# Patient Record
Sex: Male | Born: 1960 | Race: Black or African American | Hispanic: No | Marital: Single | Smoking: Former smoker
Health system: Southern US, Community
[De-identification: ages and names within clinical notes are randomized; demographics above are authoritative.]

## PROBLEM LIST (undated history)

## (undated) DIAGNOSIS — M1990 Unspecified osteoarthritis, unspecified site: Secondary | ICD-10-CM

## (undated) DIAGNOSIS — K4091 Unilateral inguinal hernia, without obstruction or gangrene, recurrent: Secondary | ICD-10-CM

## (undated) DIAGNOSIS — I5189 Other ill-defined heart diseases: Secondary | ICD-10-CM

## (undated) DIAGNOSIS — E559 Vitamin D deficiency, unspecified: Secondary | ICD-10-CM

## (undated) DIAGNOSIS — I2510 Atherosclerotic heart disease of native coronary artery without angina pectoris: Secondary | ICD-10-CM

## (undated) DIAGNOSIS — I10 Essential (primary) hypertension: Secondary | ICD-10-CM

## (undated) DIAGNOSIS — K429 Umbilical hernia without obstruction or gangrene: Secondary | ICD-10-CM

## (undated) DIAGNOSIS — G713 Mitochondrial myopathy, not elsewhere classified: Secondary | ICD-10-CM

## (undated) DIAGNOSIS — M5030 Other cervical disc degeneration, unspecified cervical region: Secondary | ICD-10-CM

## (undated) DIAGNOSIS — T7840XA Allergy, unspecified, initial encounter: Secondary | ICD-10-CM

## (undated) DIAGNOSIS — K625 Hemorrhage of anus and rectum: Secondary | ICD-10-CM

## (undated) DIAGNOSIS — K5909 Other constipation: Secondary | ICD-10-CM

## (undated) DIAGNOSIS — Z87891 Personal history of nicotine dependence: Secondary | ICD-10-CM

## (undated) DIAGNOSIS — E785 Hyperlipidemia, unspecified: Secondary | ICD-10-CM

## (undated) DIAGNOSIS — M5410 Radiculopathy, site unspecified: Secondary | ICD-10-CM

## (undated) DIAGNOSIS — G4730 Sleep apnea, unspecified: Secondary | ICD-10-CM

## (undated) DIAGNOSIS — G4733 Obstructive sleep apnea (adult) (pediatric): Secondary | ICD-10-CM

## (undated) DIAGNOSIS — R569 Unspecified convulsions: Secondary | ICD-10-CM

## (undated) DIAGNOSIS — E119 Type 2 diabetes mellitus without complications: Secondary | ICD-10-CM

## (undated) DIAGNOSIS — G479 Sleep disorder, unspecified: Secondary | ICD-10-CM

## (undated) DIAGNOSIS — G629 Polyneuropathy, unspecified: Secondary | ICD-10-CM

## (undated) DIAGNOSIS — N529 Male erectile dysfunction, unspecified: Secondary | ICD-10-CM

## (undated) DIAGNOSIS — I209 Angina pectoris, unspecified: Secondary | ICD-10-CM

## (undated) DIAGNOSIS — R0600 Dyspnea, unspecified: Secondary | ICD-10-CM

## (undated) DIAGNOSIS — D751 Secondary polycythemia: Secondary | ICD-10-CM

## (undated) DIAGNOSIS — K219 Gastro-esophageal reflux disease without esophagitis: Secondary | ICD-10-CM

## (undated) DIAGNOSIS — R0602 Shortness of breath: Secondary | ICD-10-CM

## (undated) DIAGNOSIS — E669 Obesity, unspecified: Secondary | ICD-10-CM

## (undated) DIAGNOSIS — K469 Unspecified abdominal hernia without obstruction or gangrene: Secondary | ICD-10-CM

## (undated) HISTORY — PX: WRIST SURGERY: SHX841

## (undated) HISTORY — PX: SPINE SURGERY: SHX786

## (undated) HISTORY — DX: Obesity, unspecified: E66.9

## (undated) HISTORY — DX: Shortness of breath: R06.02

## (undated) HISTORY — DX: Unilateral inguinal hernia, without obstruction or gangrene, recurrent: K40.91

## (undated) HISTORY — DX: Other constipation: K59.09

## (undated) HISTORY — DX: Allergy, unspecified, initial encounter: T78.40XA

## (undated) HISTORY — DX: Vitamin D deficiency, unspecified: E55.9

## (undated) HISTORY — DX: Unspecified osteoarthritis, unspecified site: M19.90

## (undated) HISTORY — DX: Gastro-esophageal reflux disease without esophagitis: K21.9

## (undated) HISTORY — DX: Unspecified abdominal hernia without obstruction or gangrene: K46.9

## (undated) HISTORY — DX: Other ill-defined heart diseases: I51.89

## (undated) HISTORY — DX: Angina pectoris, unspecified: I20.9

## (undated) HISTORY — DX: Polyneuropathy, unspecified: G62.9

## (undated) HISTORY — DX: Hyperlipidemia, unspecified: E78.5

## (undated) HISTORY — DX: Other cervical disc degeneration, unspecified cervical region: M50.30

## (undated) HISTORY — DX: Secondary polycythemia: D75.1

## (undated) HISTORY — DX: Radiculopathy, site unspecified: M54.10

## (undated) HISTORY — DX: Type 2 diabetes mellitus without complications: E11.9

## (undated) HISTORY — DX: Personal history of nicotine dependence: Z87.891

## (undated) HISTORY — DX: Umbilical hernia without obstruction or gangrene: K42.9

## (undated) HISTORY — DX: Hemorrhage of anus and rectum: K62.5

## (undated) HISTORY — DX: Essential (primary) hypertension: I10

---

## 1989-12-28 DIAGNOSIS — K469 Unspecified abdominal hernia without obstruction or gangrene: Secondary | ICD-10-CM

## 1989-12-28 HISTORY — DX: Unspecified abdominal hernia without obstruction or gangrene: K46.9

## 1989-12-28 HISTORY — PX: HERNIA REPAIR: SHX51

## 2005-04-03 ENCOUNTER — Ambulatory Visit: Payer: Self-pay | Admitting: Unknown Physician Specialty

## 2006-01-12 ENCOUNTER — Other Ambulatory Visit: Payer: Self-pay

## 2006-01-14 ENCOUNTER — Inpatient Hospital Stay: Payer: Self-pay | Admitting: Unknown Physician Specialty

## 2006-06-04 ENCOUNTER — Ambulatory Visit: Payer: Self-pay | Admitting: Family Medicine

## 2006-12-28 DIAGNOSIS — I10 Essential (primary) hypertension: Secondary | ICD-10-CM

## 2006-12-28 HISTORY — DX: Essential (primary) hypertension: I10

## 2007-03-29 ENCOUNTER — Ambulatory Visit: Payer: Self-pay | Admitting: Internal Medicine

## 2007-04-12 ENCOUNTER — Ambulatory Visit: Payer: Self-pay | Admitting: Internal Medicine

## 2007-04-21 ENCOUNTER — Ambulatory Visit: Payer: Self-pay | Admitting: General Practice

## 2007-04-28 ENCOUNTER — Ambulatory Visit: Payer: Self-pay | Admitting: Internal Medicine

## 2007-05-29 ENCOUNTER — Ambulatory Visit: Payer: Self-pay | Admitting: Internal Medicine

## 2007-06-28 ENCOUNTER — Ambulatory Visit: Payer: Self-pay | Admitting: Internal Medicine

## 2007-07-29 ENCOUNTER — Ambulatory Visit: Payer: Self-pay | Admitting: Internal Medicine

## 2007-08-29 ENCOUNTER — Ambulatory Visit: Payer: Self-pay | Admitting: Internal Medicine

## 2007-09-19 ENCOUNTER — Ambulatory Visit: Payer: Self-pay | Admitting: Internal Medicine

## 2007-09-28 ENCOUNTER — Ambulatory Visit: Payer: Self-pay | Admitting: Internal Medicine

## 2007-10-29 ENCOUNTER — Ambulatory Visit: Payer: Self-pay | Admitting: Internal Medicine

## 2007-11-02 ENCOUNTER — Ambulatory Visit: Payer: Self-pay | Admitting: Internal Medicine

## 2007-11-28 ENCOUNTER — Ambulatory Visit: Payer: Self-pay | Admitting: Internal Medicine

## 2007-12-29 ENCOUNTER — Ambulatory Visit: Payer: Self-pay | Admitting: Internal Medicine

## 2008-01-29 ENCOUNTER — Ambulatory Visit: Payer: Self-pay | Admitting: Internal Medicine

## 2008-03-06 ENCOUNTER — Ambulatory Visit: Payer: Self-pay | Admitting: Internal Medicine

## 2008-03-07 ENCOUNTER — Ambulatory Visit: Payer: Self-pay | Admitting: Internal Medicine

## 2008-03-28 ENCOUNTER — Ambulatory Visit: Payer: Self-pay | Admitting: Internal Medicine

## 2008-04-07 ENCOUNTER — Emergency Department: Payer: Self-pay | Admitting: Emergency Medicine

## 2008-04-27 ENCOUNTER — Ambulatory Visit: Payer: Self-pay | Admitting: Internal Medicine

## 2008-05-28 ENCOUNTER — Ambulatory Visit: Payer: Self-pay | Admitting: Internal Medicine

## 2008-06-27 ENCOUNTER — Ambulatory Visit: Payer: Self-pay | Admitting: Internal Medicine

## 2008-07-28 ENCOUNTER — Ambulatory Visit: Payer: Self-pay | Admitting: Internal Medicine

## 2008-08-28 ENCOUNTER — Ambulatory Visit: Payer: Self-pay | Admitting: Internal Medicine

## 2008-09-27 ENCOUNTER — Ambulatory Visit: Payer: Self-pay | Admitting: Internal Medicine

## 2008-10-28 ENCOUNTER — Ambulatory Visit: Payer: Self-pay | Admitting: Internal Medicine

## 2008-11-27 ENCOUNTER — Ambulatory Visit: Payer: Self-pay | Admitting: Internal Medicine

## 2008-12-28 ENCOUNTER — Ambulatory Visit: Payer: Self-pay | Admitting: Internal Medicine

## 2008-12-28 HISTORY — PX: BACK SURGERY: SHX140

## 2009-01-28 ENCOUNTER — Ambulatory Visit: Payer: Self-pay | Admitting: Internal Medicine

## 2009-02-25 ENCOUNTER — Ambulatory Visit: Payer: Self-pay | Admitting: Internal Medicine

## 2009-03-07 ENCOUNTER — Ambulatory Visit: Payer: Self-pay | Admitting: Internal Medicine

## 2009-03-28 ENCOUNTER — Ambulatory Visit: Payer: Self-pay | Admitting: Internal Medicine

## 2009-04-01 ENCOUNTER — Inpatient Hospital Stay: Payer: Self-pay | Admitting: Internal Medicine

## 2009-04-27 ENCOUNTER — Ambulatory Visit: Payer: Self-pay | Admitting: Internal Medicine

## 2009-05-28 ENCOUNTER — Ambulatory Visit: Payer: Self-pay | Admitting: Internal Medicine

## 2009-05-30 ENCOUNTER — Ambulatory Visit: Payer: Self-pay | Admitting: Internal Medicine

## 2009-06-27 ENCOUNTER — Ambulatory Visit: Payer: Self-pay | Admitting: Internal Medicine

## 2009-07-28 ENCOUNTER — Ambulatory Visit: Payer: Self-pay | Admitting: Internal Medicine

## 2009-08-22 ENCOUNTER — Ambulatory Visit: Payer: Self-pay | Admitting: Internal Medicine

## 2009-08-28 ENCOUNTER — Ambulatory Visit: Payer: Self-pay | Admitting: Internal Medicine

## 2009-10-28 ENCOUNTER — Ambulatory Visit: Payer: Self-pay | Admitting: Internal Medicine

## 2009-11-14 ENCOUNTER — Ambulatory Visit: Payer: Self-pay | Admitting: Internal Medicine

## 2009-11-27 ENCOUNTER — Ambulatory Visit: Payer: Self-pay | Admitting: Internal Medicine

## 2009-12-13 ENCOUNTER — Ambulatory Visit: Payer: Self-pay

## 2009-12-29 ENCOUNTER — Ambulatory Visit: Payer: Self-pay

## 2010-01-08 ENCOUNTER — Ambulatory Visit: Payer: Self-pay | Admitting: Pain Medicine

## 2010-01-14 ENCOUNTER — Ambulatory Visit: Payer: Self-pay | Admitting: Pain Medicine

## 2010-02-12 ENCOUNTER — Ambulatory Visit: Payer: Self-pay | Admitting: Pain Medicine

## 2010-02-18 ENCOUNTER — Ambulatory Visit: Payer: Self-pay | Admitting: Pain Medicine

## 2010-03-04 ENCOUNTER — Ambulatory Visit: Payer: Self-pay | Admitting: Pain Medicine

## 2010-03-27 ENCOUNTER — Ambulatory Visit: Payer: Self-pay | Admitting: Pain Medicine

## 2010-04-27 ENCOUNTER — Ambulatory Visit: Payer: Self-pay | Admitting: Internal Medicine

## 2010-05-15 ENCOUNTER — Ambulatory Visit: Payer: Self-pay | Admitting: Internal Medicine

## 2010-05-28 ENCOUNTER — Ambulatory Visit: Payer: Self-pay | Admitting: Internal Medicine

## 2010-06-27 ENCOUNTER — Ambulatory Visit: Payer: Self-pay | Admitting: Internal Medicine

## 2010-07-28 ENCOUNTER — Ambulatory Visit: Payer: Self-pay | Admitting: Internal Medicine

## 2010-08-27 ENCOUNTER — Ambulatory Visit: Payer: Self-pay | Admitting: Internal Medicine

## 2010-08-28 ENCOUNTER — Ambulatory Visit: Payer: Self-pay | Admitting: Internal Medicine

## 2010-11-26 ENCOUNTER — Ambulatory Visit: Payer: Self-pay | Admitting: Internal Medicine

## 2010-11-27 ENCOUNTER — Ambulatory Visit: Payer: Self-pay | Admitting: Internal Medicine

## 2010-12-28 DIAGNOSIS — K429 Umbilical hernia without obstruction or gangrene: Secondary | ICD-10-CM

## 2010-12-28 DIAGNOSIS — K4091 Unilateral inguinal hernia, without obstruction or gangrene, recurrent: Secondary | ICD-10-CM

## 2010-12-28 DIAGNOSIS — Z87891 Personal history of nicotine dependence: Secondary | ICD-10-CM

## 2010-12-28 DIAGNOSIS — E669 Obesity, unspecified: Secondary | ICD-10-CM

## 2010-12-28 HISTORY — DX: Umbilical hernia without obstruction or gangrene: K42.9

## 2010-12-28 HISTORY — DX: Unilateral inguinal hernia, without obstruction or gangrene, recurrent: K40.91

## 2010-12-28 HISTORY — DX: Obesity, unspecified: E66.9

## 2010-12-28 HISTORY — DX: Personal history of nicotine dependence: Z87.891

## 2010-12-28 HISTORY — PX: INGUINAL HERNIA REPAIR: SUR1180

## 2011-01-30 ENCOUNTER — Ambulatory Visit: Payer: Self-pay | Admitting: Anesthesiology

## 2011-02-09 ENCOUNTER — Ambulatory Visit: Payer: Self-pay | Admitting: General Surgery

## 2011-02-18 ENCOUNTER — Ambulatory Visit: Payer: Self-pay | Admitting: Internal Medicine

## 2011-02-26 ENCOUNTER — Ambulatory Visit: Payer: Self-pay | Admitting: Internal Medicine

## 2011-05-05 ENCOUNTER — Ambulatory Visit: Payer: Self-pay | Admitting: Family Medicine

## 2012-12-28 HISTORY — PX: INGUINAL HERNIA REPAIR: SUR1180

## 2012-12-28 HISTORY — PX: COLONOSCOPY: SHX174

## 2013-03-20 ENCOUNTER — Encounter: Payer: Self-pay | Admitting: *Deleted

## 2013-03-23 ENCOUNTER — Encounter: Payer: Self-pay | Admitting: General Surgery

## 2013-03-23 ENCOUNTER — Ambulatory Visit (INDEPENDENT_AMBULATORY_CARE_PROVIDER_SITE_OTHER): Payer: Medicaid Other | Admitting: General Surgery

## 2013-03-23 VITALS — BP 138/78 | HR 72 | Resp 14 | Ht 71.0 in | Wt 262.0 lb

## 2013-03-23 DIAGNOSIS — Z1211 Encounter for screening for malignant neoplasm of colon: Secondary | ICD-10-CM

## 2013-03-23 DIAGNOSIS — K4091 Unilateral inguinal hernia, without obstruction or gangrene, recurrent: Secondary | ICD-10-CM

## 2013-03-23 MED ORDER — POLYETHYLENE GLYCOL 3350 17 GM/SCOOP PO POWD: ORAL | Status: DC

## 2013-03-23 NOTE — Progress Notes (Signed)
Patient ID: [name] [name], male   DOB: [birth year], 52 y.o.   MRN: [id number]  Chief Complaint  Patient presents with  . Colonoscopy    screening  . Incisional Hernia    1 year, painful     HPI [name] [name] is a 52 y.o. male.  The patient presents for a screening colonoscopy as well as an inguinal hernia. Patient states that where the hernia is located was a previous repair site. He states any movement causes pain. It has progressively gotten worse over time. No other concerns at this time.  HPI  Past Medical History  Diagnosis Date  . Hernia 1991    02/10/2012-RIH repair  . Personal history of tobacco use, presenting hazards to health 2012  . Hypertension 2008  . Inguinal hernia without mention of obstruction or gangrene, recurrent unilateral or unspecified 2012  . Obesity, unspecified 2012  . Umbilical hernia without mention of obstruction or gangrene 2012    Past Surgical History  Procedure Laterality Date  . Hernia repair  1991  . Inguinal hernia repair Right 2012  . Back surgery  12/2008    No family history on file.  Social History History  Substance Use Topics  . Smoking status: Former Smoker -- 3 years    Types: Cigarettes  . Smokeless tobacco: Never Used  . Alcohol Use: 3.6 oz/week    6 Cans of beer per week    Allergies  Allergen Reactions  . Penicillins Hives and Rash    Current Outpatient Prescriptions  Medication Sig Dispense Refill  . amLODipine-atorvastatin (CADUET) 5-20 MG per tablet Take 1 tablet by mouth daily.      . Armodafinil (NUVIGIL) 150 MG tablet Take 150 mg by mouth daily.      Marland Kitchen aspirin 81 MG tablet Take 81 mg by mouth daily.      Marland Kitchen atorvastatin (LIPITOR) 40 MG tablet Take 40 mg by mouth daily.      . carvedilol (COREG) 12.5 MG tablet Take 12.5 mg by mouth 2 (two) times daily.      Marland Kitchen HYDROcodone-acetaminophen (LORTAB) 10-500 MG per tablet Take 1 tablet by mouth 2 (two) times daily.      Marland Kitchen loratadine (CLARITIN) 10 MG tablet Take 10 mg by  mouth daily.      . meloxicam (MOBIC) 7.5 MG tablet Take 7.5 mg by mouth daily.       No current facility-administered medications for this visit.    Review of Systems Review of Systems  Constitutional: Negative.   Respiratory: Negative.   Cardiovascular: Negative.   Gastrointestinal: Positive for abdominal pain.    Blood pressure 138/78, pulse 72, resp. rate 14, height 5\' 11"  (1.803 m), weight 262 lb (118.842 kg).  Physical Exam Physical Exam  Constitutional: He appears well-developed and well-nourished.  Neck: Normal range of motion. Neck supple.  Cardiovascular: Normal rate, regular rhythm and normal heart sounds.   Pulmonary/Chest: Effort normal and breath sounds normal.  Abdominal: Soft. Bowel sounds are normal. A hernia is present. Hernia confirmed positive in the right inguinal area (small).      Data Reviewed    Assessment    Recurrent right inguinal hernia. Needs screening colonoscopy    Plan    Screening colonoscopy and subsequent repair of recurrent Rt. Ing hernia        Greta Doom F 03/23/2013, 11:40 AM

## 2013-03-23 NOTE — Patient Instructions (Addendum)
Colonoscopy with possible biopsy/polypectomy prn: Information regarding the procedure, including its potential risks and complications (including but not limited to perforation of the bowel, which may require emergency surgery to repair, and bleeding) was verbally given to the patient. Educational information regarding lower instestinal endoscopy was given to the patient. Written instructions for how to complete the bowel prep using Miralax were provided. The importance of drinking ample fluids to avoid dehydration as a result of the prep emphasized.  Patient to have a colonoscopy at [facility]. This has been arranged for 04-05-13. Miralax prescription has been sent to patient's pharmacy.   Also, patient to have a right inguinal hernia repair at [facility] on 04-07-13. He is aware of all instructions. Inguinal Hernia, Adult Muscles help keep everything in the body in its proper place. But if a weak spot in the muscles develops, something can poke through. That is called a hernia. When this happens in the lower part of the belly (abdomen), it is called an inguinal hernia. (It takes its name from a part of the body in this region called the inguinal canal.) A weak spot in the wall of muscles lets some fat or part of the small intestine bulge through. An inguinal hernia can develop at any age. Men get them more often than women. CAUSES  In adults, an inguinal hernia develops over time.  It can be triggered by:  Suddenly straining the muscles of the lower abdomen.  Lifting heavy objects.  Straining to have a bowel movement. Difficult bowel movements (constipation) can lead to this.  Constant coughing. This may be caused by smoking or lung disease.  Being overweight.  Being pregnant.  Working at a job that requires long periods of standing or heavy lifting.  Having had an inguinal hernia before. One type can be an emergency situation. It is called a strangulated inguinal hernia. It develops if part of the  small intestine slips through the weak spot and cannot get back into the abdomen. The blood supply can be cut off. If that happens, part of the intestine may die. This situation requires emergency surgery. SYMPTOMS  Often, a small inguinal hernia has no symptoms. It is found when a healthcare provider does a physical exam. Larger hernias usually have symptoms.   In adults, symptoms may include:  A lump in the groin. This is easier to see when the person is standing. It might disappear when lying down.  In men, a lump in the scrotum.  Pain or burning in the groin. This occurs especially when lifting, straining or coughing.  A dull ache or feeling of pressure in the groin.  Signs of a strangulated hernia can include:  A bulge in the groin that becomes very painful and tender to the touch.  A bulge that turns red or purple.  Fever, nausea and vomiting.  Inability to have a bowel movement or to pass gas. DIAGNOSIS  To decide if you have an inguinal hernia, a healthcare provider will probably do a physical examination.  This will include asking questions about any symptoms you have noticed.  The healthcare provider might feel the groin area and ask you to cough. If an inguinal hernia is felt, the healthcare provider may try to slide it back into the abdomen.  Usually no other tests are needed. TREATMENT  Treatments can vary. The size of the hernia makes a difference. Options include:  Watchful waiting. This is often suggested if the hernia is small and you have had no symptoms.  No medical procedure will be done unless symptoms develop.  You will need to watch closely for symptoms. If any occur, contact your healthcare provider right away.  Surgery. This is used if the hernia is larger or you have symptoms.  Open surgery. This is usually an outpatient procedure (you will not stay overnight in a hospital). An cut (incision) is made through the skin in the groin. The hernia is put  back inside the abdomen. The weak area in the muscles is then repaired by herniorrhaphy or hernioplasty. Herniorrhaphy: in this type of surgery, the weak muscles are sewn back together. Hernioplasty: a patch or mesh is used to close the weak area in the abdominal wall.  Laparoscopy. In this procedure, a surgeon makes small incisions. A thin tube with a tiny video camera (called a laparoscope) is put into the abdomen. The surgeon repairs the hernia with mesh by looking with the video camera and using two long instruments. HOME CARE INSTRUCTIONS   After surgery to repair an inguinal hernia:  You will need to take pain medicine prescribed by your healthcare provider. Follow all directions carefully.  You will need to take care of the wound from the incision.  Your activity will be restricted for awhile. This will probably include no heavy lifting for several weeks. You also should not do anything too active for a few weeks. When you can return to work will depend on the type of job that you have.  During "watchful waiting" periods, you should:  Maintain a healthy weight.  Eat a diet high in fiber (fruits, vegetables and whole grains).  Drink plenty of fluids to avoid constipation. This means drinking enough water and other liquids to keep your urine clear or pale yellow.  Do not lift heavy objects.  Do not stand for long periods of time.  Quit smoking. This should keep you from developing a frequent cough. SEEK MEDICAL CARE IF:   A bulge develops in your groin area.  You feel pain, a burning sensation or pressure in the groin. This might be worse if you are lifting or straining.  You develop a fever of more than 100.5 F (38.1 C). SEEK IMMEDIATE MEDICAL CARE IF:   Pain in the groin increases suddenly.  A bulge in the groin gets bigger suddenly and does not go down.  For men, there is sudden pain in the scrotum. Or, the size of the scrotum increases.  A bulge in the groin area  becomes red or purple and is painful to touch.  You have nausea or vomiting that does not go away.  You feel your heart beating much faster than normal.  You cannot have a bowel movement or pass gas.  You develop a fever of more than 102.0 F (38.9 C). Document Released: 05/02/2009 Document Revised: 03/07/2012 Document Reviewed: 05/02/2009 [facility] Patient Information 2013 [place], [place].

## 2013-03-24 ENCOUNTER — Encounter: Payer: Self-pay | Admitting: General Surgery

## 2013-03-24 DIAGNOSIS — K4091 Unilateral inguinal hernia, without obstruction or gangrene, recurrent: Secondary | ICD-10-CM | POA: Insufficient documentation

## 2013-03-24 DIAGNOSIS — Z1211 Encounter for screening for malignant neoplasm of colon: Secondary | ICD-10-CM | POA: Insufficient documentation

## 2013-03-27 ENCOUNTER — Other Ambulatory Visit: Payer: Self-pay | Admitting: General Surgery

## 2013-03-27 DIAGNOSIS — Z1211 Encounter for screening for malignant neoplasm of colon: Secondary | ICD-10-CM

## 2013-03-28 ENCOUNTER — Other Ambulatory Visit: Payer: Self-pay | Admitting: General Surgery

## 2013-03-28 DIAGNOSIS — K4091 Unilateral inguinal hernia, without obstruction or gangrene, recurrent: Secondary | ICD-10-CM

## 2013-03-31 ENCOUNTER — Ambulatory Visit: Payer: Self-pay | Admitting: General Surgery

## 2013-03-31 DIAGNOSIS — I10 Essential (primary) hypertension: Secondary | ICD-10-CM

## 2013-04-05 ENCOUNTER — Ambulatory Visit: Payer: Self-pay | Admitting: General Surgery

## 2013-04-05 DIAGNOSIS — D126 Benign neoplasm of colon, unspecified: Secondary | ICD-10-CM

## 2013-04-05 DIAGNOSIS — Z1211 Encounter for screening for malignant neoplasm of colon: Secondary | ICD-10-CM

## 2013-04-05 LAB — HM COLONOSCOPY: HM Colonoscopy: NORMAL

## 2013-04-06 ENCOUNTER — Encounter: Payer: Self-pay | Admitting: General Surgery

## 2013-04-07 ENCOUNTER — Ambulatory Visit: Payer: Self-pay | Admitting: General Surgery

## 2013-04-07 DIAGNOSIS — K4091 Unilateral inguinal hernia, without obstruction or gangrene, recurrent: Secondary | ICD-10-CM

## 2013-04-11 ENCOUNTER — Telehealth: Payer: Self-pay | Admitting: *Deleted

## 2013-04-11 NOTE — Telephone Encounter (Signed)
Patient notified

## 2013-04-11 NOTE — Telephone Encounter (Signed)
Message copied by Currie Paris on Tue Apr 11, 2013  1:22 PM ------      Message from: Kieth Brightly      Created: Thu Apr 06, 2013  8:34 PM       Please let pt pt know the pathology on polyp from colon was normal-not a true polyp. ------

## 2013-04-17 ENCOUNTER — Encounter: Payer: Self-pay | Admitting: General Surgery

## 2013-04-18 ENCOUNTER — Encounter: Payer: Self-pay | Admitting: General Surgery

## 2013-04-18 ENCOUNTER — Ambulatory Visit (INDEPENDENT_AMBULATORY_CARE_PROVIDER_SITE_OTHER): Payer: Medicaid Other | Admitting: General Surgery

## 2013-04-18 VITALS — BP 124/76 | HR 76 | Resp 14 | Ht 71.0 in | Wt 263.0 lb

## 2013-04-18 DIAGNOSIS — Z1211 Encounter for screening for malignant neoplasm of colon: Secondary | ICD-10-CM

## 2013-04-18 DIAGNOSIS — K4091 Unilateral inguinal hernia, without obstruction or gangrene, recurrent: Secondary | ICD-10-CM

## 2013-04-18 NOTE — Patient Instructions (Addendum)
Patient may resume activities as tolerated, taking care to use proper technique when lifting (demonstrated) 

## 2013-04-18 NOTE — Progress Notes (Signed)
Patient ID: [name] [name], male   DOB: [birth year], 52 y.o.   MRN: [id number]  Chief Complaint  Patient presents with  . Routine Post Op    inguinal hernia    HPI [name] [name] is a 52 y.o. male here today for his post op inguinal hernia repair done on 03/28/13.Patient doing well. He also had screening colonoscopy and a tiny hyperplastic polyp removed from transverse colon.  HPI  Past Medical History  Diagnosis Date  . Hernia 1991    02/10/2012-RIH repair  . Personal history of tobacco use, presenting hazards to health 2012  . Hypertension 2008  . Inguinal hernia without mention of obstruction or gangrene, recurrent unilateral or unspecified 2012  . Obesity, unspecified 2012  . Umbilical hernia without mention of obstruction or gangrene 2012    Past Surgical History  Procedure Laterality Date  . Hernia repair  1991  . Inguinal hernia repair Right 2012  . Back surgery  12/2008  . Colonoscopy  2014    Dr. Evette Cristal    History reviewed. No pertinent family history.  Social History History  Substance Use Topics  . Smoking status: Former Smoker -- 3 years    Types: Cigarettes  . Smokeless tobacco: Never Used  . Alcohol Use: 3.6 oz/week    6 Cans of beer per week    Allergies  Allergen Reactions  . Penicillins Hives and Rash    Current Outpatient Prescriptions  Medication Sig Dispense Refill  . amLODipine-atorvastatin (CADUET) 5-20 MG per tablet Take 1 tablet by mouth daily.      . Armodafinil (NUVIGIL) 150 MG tablet Take 150 mg by mouth daily.      Marland Kitchen aspirin 81 MG tablet Take 81 mg by mouth daily.      Marland Kitchen atorvastatin (LIPITOR) 40 MG tablet Take 40 mg by mouth daily.      . carvedilol (COREG) 12.5 MG tablet Take 12.5 mg by mouth 2 (two) times daily.      Marland Kitchen HYDROcodone-acetaminophen (LORTAB) 10-500 MG per tablet Take 1 tablet by mouth 2 (two) times daily.      Marland Kitchen loratadine (CLARITIN) 10 MG tablet Take 10 mg by mouth daily.      . meloxicam (MOBIC) 7.5 MG tablet Take 7.5 mg by  mouth daily.      . polyethylene glycol powder (GLYCOLAX/MIRALAX) powder 255 grams one bottle for colonoscopy prep  255 g  0   No current facility-administered medications for this visit.    Review of Systems Review of Systems  Constitutional: Negative.   Respiratory: Negative.     Blood pressure 124/76, pulse 76, resp. rate 14, height 5\' 11"  (1.803 m), weight 263 lb (119.296 kg).  Physical Exam Physical Exam  Abdominal:    Genitourinary:  Right inguinal hernia healing well    Data Reviewed none  Assessment    Doing well postop.     Plan    May advance activity as tolerated.        SANKAR,SEEPLAPUTHUR G 04/19/2013, 6:30 AM

## 2013-04-19 ENCOUNTER — Encounter: Payer: Self-pay | Admitting: General Surgery

## 2013-05-16 ENCOUNTER — Encounter: Payer: Self-pay | Admitting: General Surgery

## 2013-05-16 ENCOUNTER — Ambulatory Visit (INDEPENDENT_AMBULATORY_CARE_PROVIDER_SITE_OTHER): Payer: Medicaid Other | Admitting: General Surgery

## 2013-05-16 VITALS — BP 130/90 | HR 66 | Resp 16 | Ht 71.0 in | Wt 260.0 lb

## 2013-05-16 DIAGNOSIS — K4091 Unilateral inguinal hernia, without obstruction or gangrene, recurrent: Secondary | ICD-10-CM

## 2013-05-16 NOTE — Progress Notes (Signed)
Patient ID: [name] [name], male   DOB: Feb 26, 1961, 52 y.o.   MRN: [id number]  The patient presents for a 4 week post op visit for a right inguinal hernia. Procedure date was on  03/28/13. Patient states he has pain every now and then but overall is doing well.   Incision well healed in the right groin. Hernia repair is intact.  Recovered well from right inguinal repair. No limitations.

## 2013-05-16 NOTE — Patient Instructions (Signed)
No activity limitations

## 2014-05-23 LAB — LIPID PANEL
CHOLESTEROL: 222 mg/dL — AB (ref 0–200)
HDL: 46 mg/dL (ref 35–70)
LDL CALC: 145 mg/dL
TRIGLYCERIDES: 155 mg/dL (ref 40–160)

## 2014-09-18 ENCOUNTER — Ambulatory Visit: Payer: Self-pay | Admitting: Family Medicine

## 2014-09-27 ENCOUNTER — Ambulatory Visit: Payer: Self-pay | Admitting: Family Medicine

## 2014-11-28 LAB — HEMOGLOBIN A1C: HEMOGLOBIN A1C: 5.5 % (ref 4.0–6.0)

## 2015-04-16 ENCOUNTER — Encounter: Payer: Self-pay | Admitting: Family Medicine

## 2015-04-19 NOTE — Op Note (Signed)
PATIENT NAME:  AUGUST, LONGEST MR#:  [id number] DATE OF BIRTH:  04/27/1961  DATE OF PROCEDURE:  04/07/2013  PREOPERATIVE DIAGNOSIS:  Recurrent right inguinal hernia.   POSTOPERATIVE DIAGNOSIS:  Recurrent right inguinal hernia.  OPERATION:  Repair of recurrent right inguinal hernia with mesh.   SURGEON:  Mckinley Jewel, M.D.   ANESTHESIA:  General.   COMPLICATIONS:  None.   ESTIMATED BLOOD LOSS:  Less than 20 mL.   DRAINS:  None.   DESCRIPTION OF PROCEDURE:  The patient was put to sleep in a supine position on the operating table. The right groin area was prepped and draped out as a sterile field. Local anesthetic of 0.5% Marcaine was instilled for postoperative analgesia, and this was repeated at the end of the procedure. A total of 30 mL used. The previous skin incision was reopened and extended down through the subcutaneous tissue on the previous scar down to the external oblique. Bleeding controlled with cautery and ligatures of 3-0 Vicryl. There was significant scarring all along the external oblique aponeurosis which was subsequently opened and carefully exposed the spermatic cord. Additional dissection was then performed to free the cord from the posterior wall, and the exploration revealed the patient had a direct hernia located just medial to the internal ring with a little larger than a fingerbreadth opening. After this hernial protrusion was freed, the cord was inspected to ensure there was no indirect sac. The direct hernia was then pushed back and closed over with a couple of stitches of 2-0 PDS incorporating the edges of the defect in the posterior wall and narrowing the internal ring to about just a fingerbreadth in size. Following this, ProGrip mesh was brought up to the field. It was trimmed on the lateral aspect and inferior aspect, placed around the cord and laid down against the posterior wall. It was tacked to the pubic tubercle with a single 2-0 PDS stitch. After smoothing out  the mesh, the cord was placed back in its position. The external oblique was closed with a running 2-0 PDS. The subcutaneous tissue was irrigated and closed with 3-0 Vicryl. The skin closed with subcuticular 4-0 Vicryl, covered with Dermabond. The procedure was well tolerated. He was subsequently extubated and returned to the Recovery Room in stable condition.   ____________________________ S.Robinette Haines, MD sgs:jm D: 04/07/2013 11:21:03 ET T: 04/07/2013 11:43:04 ET JOB#: [id number]  cc: Synthia Innocent. [name], MD, <Dictator> [facility] Robinette Haines MD ELECTRONICALLY SIGNED 04/11/2013 10:45

## 2015-05-23 ENCOUNTER — Telehealth: Payer: Self-pay

## 2015-05-23 ENCOUNTER — Encounter: Payer: Self-pay | Admitting: Cardiovascular Disease

## 2015-05-23 ENCOUNTER — Ambulatory Visit (INDEPENDENT_AMBULATORY_CARE_PROVIDER_SITE_OTHER): Payer: Medicaid Other | Admitting: Cardiovascular Disease

## 2015-05-23 ENCOUNTER — Encounter (INDEPENDENT_AMBULATORY_CARE_PROVIDER_SITE_OTHER): Payer: Self-pay

## 2015-05-23 VITALS — BP 138/98 | HR 65 | Ht 71.0 in | Wt 259.0 lb

## 2015-05-23 DIAGNOSIS — R079 Chest pain, unspecified: Secondary | ICD-10-CM | POA: Diagnosis not present

## 2015-05-23 DIAGNOSIS — E785 Hyperlipidemia, unspecified: Secondary | ICD-10-CM | POA: Diagnosis not present

## 2015-05-23 DIAGNOSIS — I208 Other forms of angina pectoris: Secondary | ICD-10-CM | POA: Diagnosis not present

## 2015-05-23 DIAGNOSIS — I10 Essential (primary) hypertension: Secondary | ICD-10-CM

## 2015-05-23 DIAGNOSIS — I209 Angina pectoris, unspecified: Secondary | ICD-10-CM | POA: Insufficient documentation

## 2015-05-23 DIAGNOSIS — I152 Hypertension secondary to endocrine disorders: Secondary | ICD-10-CM | POA: Insufficient documentation

## 2015-05-23 MED ORDER — NITROGLYCERIN 0.4 MG SL SUBL
0.4000 mg | SUBLINGUAL_TABLET | SUBLINGUAL | Status: DC | PRN
Start: 2015-05-23 — End: 2017-01-13

## 2015-05-23 NOTE — Assessment & Plan Note (Signed)
The patient's symptoms are highly suggestive of class III angina. This is in spite of being on to antianginal medications including a beta blocker and a calcium channel blocker. His EKG is abnormal with inferior and inferolateral nonspecific ST and T wave changes. I discussed different management options with him including stress testing and cardiac catheterization. Given his symptoms and risk factors, I favor proceeding directly with cardiac catheterization. I discussed risks and benefits extensively with him. We scheduled the procedure for next week. In the meanwhile, I instructed him not to overexert himself. He is to continue current medications. I prescribed sublingual nitroglycerin and instructed him on the proper use. If chest pain does not resolve with one nitroglycerin, he was instructed to call 911.

## 2015-05-23 NOTE — Progress Notes (Signed)
Primary care physician: Dr. Ancil Boozer  HPI  This is a pleasant 54 year old male who was referred for evaluation of exertional chest pain. He has known history of type 2 diabetes, hypertension, hyperlipidemia and obesity.He is a previous smoker. Over the last month or 2, he noticed substernal chest heaviness with activities. This was initially mild. However, over the last 2 weeks this has been happening now with regular everyday activities. He denies any chest pain at rest. The pain is described as heavy feeling which last for about 5-10 minutes and usually resolves after about 5 minutes of rest. This has been associated with shortness of breath and sweating. The pain does not radiate. He has family history of coronary artery disease. His father died of myocardial infarction at the age of 35.   Allergies  Allergen Reactions  . Gabapentin     Groggy-Mood Changes  . Penicillins Hives and Rash     Current Outpatient Prescriptions on File Prior to Visit  Medication Sig Dispense Refill  . aspirin 81 MG tablet Take 81 mg by mouth daily.    . carvedilol (COREG) 12.5 MG tablet Take 12.5 mg by mouth 2 (two) times daily.    Marland Kitchen loratadine (CLARITIN) 10 MG tablet Take 10 mg by mouth as needed.     . meloxicam (MOBIC) 7.5 MG tablet Take 7.5 mg by mouth as needed.      No current facility-administered medications on file prior to visit.     Past Medical History  Diagnosis Date  . Hernia 1991    02/10/2012-RIH repair  . Personal history of tobacco use, presenting hazards to health 2012  . Hypertension 2008  . Inguinal hernia without mention of obstruction or gangrene, recurrent unilateral or unspecified 2012  . Obesity, unspecified 2012  . Umbilical hernia without mention of obstruction or gangrene 2012  . Diabetes mellitus without complication   . Hyperlipidemia   . Degeneration of intervertebral disc of cervical region      Past Surgical History  Procedure Laterality Date  . Back surgery   12/2008  . Colonoscopy  2014    Dr. [name]  . Hernia repair  1991  . Inguinal hernia repair Right 2012  . Inguinal hernia repair Right 2014     Family History  Problem Relation Age of Onset  . Heart Problems Mother      History   Social History  . Marital Status: Single    Spouse Name: N/A  . Number of Children: N/A  . Years of Education: N/A   Occupational History  . Not on file.   Social History Main Topics  . Smoking status: Former Smoker -- 3 years    Types: Cigarettes  . Smokeless tobacco: Never Used  . Alcohol Use: 3.6 oz/week    6 Cans of beer per week  . Drug Use: No  . Sexual Activity: Not on file   Other Topics Concern  . Not on file   Social History Narrative     ROS A 10 point review of system was performed. It is negative other than that mentioned in the history of present illness.   PHYSICAL EXAM   BP 138/98 mmHg  Pulse 65  Ht 5\' 11"  (1.803 m)  Wt 259 lb (117.482 kg)  BMI 36.14 kg/m2 Constitutional: He is oriented to person, place, and time. He appears well-developed and well-nourished. No distress.  HENT: No nasal discharge.  Head: Normocephalic and atraumatic.  Eyes: Pupils are equal and round.  No  discharge. Neck: Normal range of motion. Neck supple. No JVD present. No thyromegaly present.  Cardiovascular: Normal rate, regular rhythm, normal heart sounds. Exam reveals no gallop and no friction rub. No murmur heard.  Pulmonary/Chest: Effort normal and breath sounds normal. No stridor. No respiratory distress. He has no wheezes. He has no rales. He exhibits no tenderness.  Abdominal: Soft. Bowel sounds are normal. He exhibits no distension. There is no tenderness. There is no rebound and no guarding.  Musculoskeletal: Normal range of motion. He exhibits no edema and no tenderness.  Neurological: He is alert and oriented to person, place, and time. Coordination normal.  Skin: Skin is warm and dry. No rash noted. He is not diaphoretic. No  erythema. No pallor.  Psychiatric: He has a normal mood and affect. His behavior is normal. Judgment and thought content normal.       IEP:PIRJJ  Rhythm  Low voltage in limb leads.   -Left atrial enlargement.   -  Diffuse nonspecific T-abnormality. Minimal ST depression  ABNORMAL    ASSESSMENT AND PLAN

## 2015-05-23 NOTE — Assessment & Plan Note (Signed)
Blood pressure is reasonably controlled on current medications. 

## 2015-05-23 NOTE — Telephone Encounter (Addendum)
S/w pt and notified him that he may get nitro prescription filled however, if he takes his viagra and has chest pain,  he may not take the nitroglycerin. Pt verbalized understanding

## 2015-05-23 NOTE — Patient Instructions (Addendum)
Medication Instructions:  Your physician has recommended you make the following change in your medication:  1) START taking nitroglycerin as needed for chest pain   Labwork: Your physician recommends that you have labs today: CBC, PT/INR, BMET   Testing/Procedures: Your physician has requested that you have a cardiac catheterization. Cardiac catheterization is used to diagnose and/or treat various heart conditions. Doctors may recommend this procedure for a number of different reasons. The most common reason is to evaluate chest pain. Chest pain can be a symptom of coronary artery disease (CAD), and cardiac catheterization can show whether plaque is narrowing or blocking your heart's arteries. This procedure is also used to evaluate the valves, as well as measure the blood flow and oxygen levels in different parts of your heart.  [facility] Cardiac Cath Instructions   You are scheduled for a Cardiac Cath on: June 2, 8:30am  Please arrive at 7:30am on the day of your procedure  Do not eat/drink anything after midnight  Someone will need to drive you home  It is recommended someone be with you for the first 24 hours after your procedure  Wear clothes that are easy to get on/off and wear slip on shoes if possible   Medications bring a current list of all medications with you  ___ You may take all of your medications the morning of your procedure with enough water to swallow safely  _xx__ Do not take these medications before your procedure: Metformin 24 hours before and 48 hours after procedure  Day of your procedure: Arrive at the [facility] entrance.  Free valet service is available.  After entering the [facility] please check-in at the registration desk (1st desk on your right) to receive your armband. After receiving your armband someone will escort you to the cardiac cath/special procedures waiting area.  The usual length of stay after your procedure is about 2 to 3 hours.  This  can vary.  If you have any questions, please call our office at [phone], or you may call the cardiac cath lab at [facility] directly at [phone]   Follow-Up: Your physician recommends that you schedule a follow-up appointment in: three weeks with Dr. Fletcher Anon.    Any Other Special Instructions Will Be Listed Below (If Applicable).

## 2015-05-23 NOTE — Assessment & Plan Note (Signed)
He is currently on high dose atorvastatin.

## 2015-05-23 NOTE — Telephone Encounter (Signed)
Left message for pt to call back regarding nitro instructions

## 2015-05-24 ENCOUNTER — Ambulatory Visit
Admission: RE | Admit: 2015-05-24 | Discharge: 2015-05-24 | Disposition: A | Discharge status: Home / Self Care | Payer: Medicaid Other | Source: Ambulatory Visit | Attending: Cardiovascular Disease | Admitting: Cardiovascular Disease

## 2015-05-24 DIAGNOSIS — R079 Chest pain, unspecified: Secondary | ICD-10-CM | POA: Diagnosis present

## 2015-05-24 LAB — BASIC METABOLIC PANEL
BUN / CREAT RATIO: 8 — AB (ref 9–20)
BUN: 9 mg/dL (ref 6–24)
CHLORIDE: 101 mmol/L (ref 97–108)
CO2: 24 mmol/L (ref 18–29)
Calcium: 9.2 mg/dL (ref 8.7–10.2)
Creatinine, Ser: 1.17 mg/dL (ref 0.76–1.27)
GFR calc Af Amer: 82 mL/min/{1.73_m2} (ref >59)
GFR calc non Af Amer: 71 mL/min/{1.73_m2} (ref >59)
Glucose: 119 mg/dL — ABNORMAL HIGH (ref 65–99)
POTASSIUM: 3.9 mmol/L (ref 3.5–5.2)
Sodium: 141 mmol/L (ref 134–144)

## 2015-05-24 LAB — CBC
HEMATOCRIT: 47.7 % (ref 37.5–51.0)
Hemoglobin: 16.5 g/dL (ref 12.6–17.7)
MCH: 32.4 pg (ref 26.6–33.0)
MCHC: 34.6 g/dL (ref 31.5–35.7)
MCV: 94 fL (ref 79–97)
Platelets: 228 10*3/uL (ref 150–379)
RBC: 5.09 x10E6/uL (ref 4.14–5.80)
RDW: 13.8 % (ref 12.3–15.4)
WBC: 9 10*3/uL (ref 3.4–10.8)

## 2015-05-24 LAB — PROTIME-INR
INR: 1 (ref 0.8–1.2)
Prothrombin Time: 10.4 s (ref 9.1–12.0)

## 2015-05-30 ENCOUNTER — Ambulatory Visit
Admission: RE | Admit: 2015-05-30 | Discharge: 2015-05-30 | Disposition: A | Discharge status: Home / Self Care | Payer: Medicaid Other | Source: Ambulatory Visit | Attending: Cardiovascular Disease | Admitting: Cardiovascular Disease

## 2015-05-30 ENCOUNTER — Encounter: Payer: Self-pay | Admitting: *Deleted

## 2015-05-30 ENCOUNTER — Encounter
Admission: RE | Disposition: A | Discharge status: Home / Self Care | Payer: Medicaid Other | Source: Ambulatory Visit | Attending: Cardiovascular Disease

## 2015-05-30 DIAGNOSIS — E119 Type 2 diabetes mellitus without complications: Secondary | ICD-10-CM | POA: Insufficient documentation

## 2015-05-30 DIAGNOSIS — Z7982 Long term (current) use of aspirin: Secondary | ICD-10-CM | POA: Insufficient documentation

## 2015-05-30 DIAGNOSIS — I10 Essential (primary) hypertension: Secondary | ICD-10-CM | POA: Diagnosis not present

## 2015-05-30 DIAGNOSIS — I209 Angina pectoris, unspecified: Secondary | ICD-10-CM

## 2015-05-30 DIAGNOSIS — Z8249 Family history of ischemic heart disease and other diseases of the circulatory system: Secondary | ICD-10-CM | POA: Diagnosis not present

## 2015-05-30 DIAGNOSIS — Z87891 Personal history of nicotine dependence: Secondary | ICD-10-CM | POA: Insufficient documentation

## 2015-05-30 DIAGNOSIS — E785 Hyperlipidemia, unspecified: Secondary | ICD-10-CM | POA: Diagnosis not present

## 2015-05-30 DIAGNOSIS — Z6836 Body mass index (BMI) 36.0-36.9, adult: Secondary | ICD-10-CM | POA: Insufficient documentation

## 2015-05-30 DIAGNOSIS — I25118 Atherosclerotic heart disease of native coronary artery with other forms of angina pectoris: Secondary | ICD-10-CM | POA: Insufficient documentation

## 2015-05-30 DIAGNOSIS — I2510 Atherosclerotic heart disease of native coronary artery without angina pectoris: Secondary | ICD-10-CM | POA: Diagnosis present

## 2015-05-30 DIAGNOSIS — E669 Obesity, unspecified: Secondary | ICD-10-CM | POA: Diagnosis not present

## 2015-05-30 DIAGNOSIS — I208 Other forms of angina pectoris: Secondary | ICD-10-CM | POA: Diagnosis present

## 2015-05-30 DIAGNOSIS — Z791 Long term (current) use of non-steroidal anti-inflammatories (NSAID): Secondary | ICD-10-CM | POA: Insufficient documentation

## 2015-05-30 HISTORY — DX: Atherosclerotic heart disease of native coronary artery without angina pectoris: I25.10

## 2015-05-30 HISTORY — PX: CARDIAC CATHETERIZATION: SHX172

## 2015-05-30 HISTORY — DX: Sleep apnea, unspecified: G47.30

## 2015-05-30 SURGERY — LEFT HEART CATH
Anesthesia: Moderate Sedation

## 2015-05-30 MED ORDER — ADENOSINE (DIAGNOSTIC) 3 MG/ML IV SOLN
INTRAVENOUS | Status: AC
  Filled 2015-05-30: qty 30

## 2015-05-30 MED ORDER — FENTANYL CITRATE (PF) 100 MCG/2ML IJ SOLN
INTRAMUSCULAR | Status: AC
Start: 2015-05-30 — End: 2015-05-30
  Filled 2015-05-30: qty 2

## 2015-05-30 MED ORDER — VERAPAMIL HCL 2.5 MG/ML IV SOLN
INTRAVENOUS | Status: DC | PRN
  Administered 2015-05-30: 2.5 mg via INTRA_ARTERIAL

## 2015-05-30 MED ORDER — SODIUM CHLORIDE 0.9 % IJ SOLN: 3.0000 mL | INTRAMUSCULAR | Status: DC | PRN

## 2015-05-30 MED ORDER — MIDAZOLAM HCL 2 MG/2ML IJ SOLN
INTRAMUSCULAR | Status: DC | PRN
  Administered 2015-05-30: 2 mg via INTRAVENOUS

## 2015-05-30 MED ORDER — ASPIRIN 81 MG PO CHEW: 81.0000 mg | CHEWABLE_TABLET | ORAL | Status: DC

## 2015-05-30 MED ORDER — SODIUM CHLORIDE 0.9 % IV SOLN: 250.0000 mL | INTRAVENOUS | Status: DC | PRN

## 2015-05-30 MED ORDER — FENTANYL CITRATE (PF) 100 MCG/2ML IJ SOLN
INTRAMUSCULAR | Status: DC | PRN
  Administered 2015-05-30 (×2): 25 ug via INTRAVENOUS

## 2015-05-30 MED ORDER — MIDAZOLAM HCL 2 MG/2ML IJ SOLN
INTRAMUSCULAR | Status: AC
  Filled 2015-05-30: qty 2

## 2015-05-30 MED ORDER — VERAPAMIL HCL 2.5 MG/ML IV SOLN
INTRAVENOUS | Status: AC
  Filled 2015-05-30: qty 2

## 2015-05-30 MED ORDER — SODIUM CHLORIDE 0.9 % WEIGHT BASED INFUSION: 1.0000 mL/kg/h | INTRAVENOUS | Status: DC

## 2015-05-30 MED ORDER — HEPARIN SODIUM (PORCINE) 1000 UNIT/ML IJ SOLN
INTRAMUSCULAR | Status: AC
  Filled 2015-05-30: qty 1

## 2015-05-30 MED ORDER — HEPARIN SODIUM (PORCINE) 1000 UNIT/ML IJ SOLN
INTRAMUSCULAR | Status: DC | PRN
  Administered 2015-05-30: 6000 [IU] via INTRAVENOUS

## 2015-05-30 MED ORDER — SODIUM CHLORIDE 0.9 % IV SOLN
INTRAVENOUS | Status: DC | PRN
  Administered 2015-05-30: 250 mL/h via INTRAVENOUS

## 2015-05-30 MED ORDER — SODIUM CHLORIDE 0.9 % WEIGHT BASED INFUSION: 3.0000 mL/kg/h | INTRAVENOUS | Status: DC

## 2015-05-30 MED ORDER — ADENOSINE (DIAGNOSTIC) 140MCG/KG/MIN
INTRAVENOUS | Status: DC | PRN
  Administered 2015-05-30: 140 ug/kg/min via INTRAVENOUS

## 2015-05-30 MED ORDER — SODIUM CHLORIDE 0.9 % IJ SOLN: 3.0000 mL | Freq: Two times a day (BID) | INTRAMUSCULAR | Status: DC

## 2015-05-30 MED ORDER — IOHEXOL 300 MG/ML  SOLN
INTRAMUSCULAR | Status: DC | PRN
  Administered 2015-05-30: 100 mL via INTRAVENOUS
  Administered 2015-05-30: 30 mL via INTRAVENOUS

## 2015-05-30 SURGICAL SUPPLY — 9 items
CATH HEARTRAIL 6F IL3.5 (CATHETERS) ×3 IMPLANT
CATH INFINITI 5FR ANG PIGTAIL (CATHETERS) ×3 IMPLANT
CATH OPTITORQUE JACKY 4.0 5F (CATHETERS) ×3 IMPLANT
DEVICE RAD TR BAND REGULAR (VASCULAR PRODUCTS) ×3 IMPLANT
GLIDESHEATH SLEND SS 6F .021 (SHEATH) ×3 IMPLANT
KIT MANI 3VAL PERCEP (MISCELLANEOUS) ×3 IMPLANT
PACK CARDIAC CATH (CUSTOM PROCEDURE TRAY) ×3 IMPLANT
WIRE PRESSURE VERRATA (WIRE) ×3 IMPLANT
WIRE SAFE-T 1.5MM-J .035X260CM (WIRE) ×3 IMPLANT

## 2015-05-30 NOTE — H&P (View-Only) (Signed)
Primary care physician: Dr. Ancil Boozer  HPI  This is a pleasant 54 year old male who was referred for evaluation of exertional chest pain. He has known history of type 2 diabetes, hypertension, hyperlipidemia and obesity.He is a previous smoker. Over the last month or 2, he noticed substernal chest heaviness with activities. This was initially mild. However, over the last 2 weeks this has been happening now with regular everyday activities. He denies any chest pain at rest. The pain is described as heavy feeling which last for about 5-10 minutes and usually resolves after about 5 minutes of rest. This has been associated with shortness of breath and sweating. The pain does not radiate. He has family history of coronary artery disease. His father died of myocardial infarction at the age of 72.   Allergies  Allergen Reactions  . Gabapentin     Groggy-Mood Changes  . Penicillins Hives and Rash     Current Outpatient Prescriptions on File Prior to Visit  Medication Sig Dispense Refill  . aspirin 81 MG tablet Take 81 mg by mouth daily.    . carvedilol (COREG) 12.5 MG tablet Take 12.5 mg by mouth 2 (two) times daily.    Marland Kitchen loratadine (CLARITIN) 10 MG tablet Take 10 mg by mouth as needed.     . meloxicam (MOBIC) 7.5 MG tablet Take 7.5 mg by mouth as needed.      No current facility-administered medications on file prior to visit.     Past Medical History  Diagnosis Date  . Hernia 1991    02/10/2012-RIH repair  . Personal history of tobacco use, presenting hazards to health 2012  . Hypertension 2008  . Inguinal hernia without mention of obstruction or gangrene, recurrent unilateral or unspecified 2012  . Obesity, unspecified 2012  . Umbilical hernia without mention of obstruction or gangrene 2012  . Diabetes mellitus without complication   . Hyperlipidemia   . Degeneration of intervertebral disc of cervical region      Past Surgical History  Procedure Laterality Date  . Back surgery   12/2008  . Colonoscopy  2014    Dr. [name]  . Hernia repair  1991  . Inguinal hernia repair Right 2012  . Inguinal hernia repair Right 2014     Family History  Problem Relation Age of Onset  . Heart Problems Mother      History   Social History  . Marital Status: Single    Spouse Name: N/A  . Number of Children: N/A  . Years of Education: N/A   Occupational History  . Not on file.   Social History Main Topics  . Smoking status: Former Smoker -- 3 years    Types: Cigarettes  . Smokeless tobacco: Never Used  . Alcohol Use: 3.6 oz/week    6 Cans of beer per week  . Drug Use: No  . Sexual Activity: Not on file   Other Topics Concern  . Not on file   Social History Narrative     ROS A 10 point review of system was performed. It is negative other than that mentioned in the history of present illness.   PHYSICAL EXAM   BP 138/98 mmHg  Pulse 65  Ht 5\' 11"  (1.803 m)  Wt 259 lb (117.482 kg)  BMI 36.14 kg/m2 Constitutional: He is oriented to person, place, and time. He appears well-developed and well-nourished. No distress.  HENT: No nasal discharge.  Head: Normocephalic and atraumatic.  Eyes: Pupils are equal and round.  No  discharge. Neck: Normal range of motion. Neck supple. No JVD present. No thyromegaly present.  Cardiovascular: Normal rate, regular rhythm, normal heart sounds. Exam reveals no gallop and no friction rub. No murmur heard.  Pulmonary/Chest: Effort normal and breath sounds normal. No stridor. No respiratory distress. He has no wheezes. He has no rales. He exhibits no tenderness.  Abdominal: Soft. Bowel sounds are normal. He exhibits no distension. There is no tenderness. There is no rebound and no guarding.  Musculoskeletal: Normal range of motion. He exhibits no edema and no tenderness.  Neurological: He is alert and oriented to person, place, and time. Coordination normal.  Skin: Skin is warm and dry. No rash noted. He is not diaphoretic. No  erythema. No pallor.  Psychiatric: He has a normal mood and affect. His behavior is normal. Judgment and thought content normal.       PYP:PJKDT  Rhythm  Low voltage in limb leads.   -Left atrial enlargement.   -  Diffuse nonspecific T-abnormality. Minimal ST depression  ABNORMAL    ASSESSMENT AND PLAN

## 2015-05-30 NOTE — Discharge Instructions (Signed)
°  Keep right wrist elevated on pillow above the heart for today.  Watch right wrist for evidence of bleeding or hematoma.. If bleeding or hematoma noted, hold pressure over the site for at least 15 minutes and notify the physician.  No blending or flexing of the wrist--no lifting for the remainder of the day. Notify the physician for evidence of infection or temperature. The drugs you were given will stay in your system until tomorrow, so for the next 24 hours you should not.  Drive an automobile. Make any legal decisions.  Drink any alcoholic beverages.  Today you should start with liquids and gradually work up to solid foods as your are able to tolerate them  Resume your regular medications as prescribed by your doctor.  Avoid aspirin for 24 hours.    Change the Band-Aid or dressing as needed.  After a 2 days no dressing as needed.  Avoid strenuous activity for the remainder of the day.  Please notify your primary physician immediately if you have any unusual bleeding, trouble breathing, fever >100 degrees or pain not relieved by the medication your doctor prescribed for your doctor prescribed for you physician

## 2015-05-30 NOTE — Interval H&P Note (Signed)
History and Physical Interval Note:  05/30/2015 8:53 AM  [name] [name]  has presented today for surgery, with the diagnosis of Lt Heart Angina  The various methods of treatment have been discussed with the patient and family. After consideration of risks, benefits and other options for treatment, the patient has consented to  Procedure(s): Left Heart Cath (N/A) as a surgical intervention .  The patient's history has been reviewed, patient examined, no change in status, stable for surgery.  I have reviewed the patient's chart and labs.  Questions were answered to the patient's satisfaction.     [name]

## 2015-06-02 ENCOUNTER — Other Ambulatory Visit: Payer: Self-pay | Admitting: Family Medicine

## 2015-06-05 ENCOUNTER — Encounter: Payer: Self-pay | Admitting: Family Medicine

## 2015-06-05 ENCOUNTER — Ambulatory Visit (INDEPENDENT_AMBULATORY_CARE_PROVIDER_SITE_OTHER): Payer: Medicaid Other | Admitting: Family Medicine

## 2015-06-05 VITALS — BP 118/76 | HR 78 | Temp 98.1°F | Resp 18 | Ht 71.75 in | Wt 260.4 lb

## 2015-06-05 DIAGNOSIS — E559 Vitamin D deficiency, unspecified: Secondary | ICD-10-CM | POA: Insufficient documentation

## 2015-06-05 DIAGNOSIS — E8881 Metabolic syndrome: Secondary | ICD-10-CM | POA: Insufficient documentation

## 2015-06-05 DIAGNOSIS — I208 Other forms of angina pectoris: Secondary | ICD-10-CM

## 2015-06-05 DIAGNOSIS — M545 Low back pain, unspecified: Secondary | ICD-10-CM | POA: Insufficient documentation

## 2015-06-05 DIAGNOSIS — E1142 Type 2 diabetes mellitus with diabetic polyneuropathy: Secondary | ICD-10-CM

## 2015-06-05 DIAGNOSIS — L282 Other prurigo: Secondary | ICD-10-CM | POA: Insufficient documentation

## 2015-06-05 DIAGNOSIS — D751 Secondary polycythemia: Secondary | ICD-10-CM | POA: Insufficient documentation

## 2015-06-05 DIAGNOSIS — S83003A Unspecified subluxation of unspecified patella, initial encounter: Secondary | ICD-10-CM | POA: Insufficient documentation

## 2015-06-05 DIAGNOSIS — E785 Hyperlipidemia, unspecified: Secondary | ICD-10-CM

## 2015-06-05 DIAGNOSIS — K5909 Other constipation: Secondary | ICD-10-CM | POA: Insufficient documentation

## 2015-06-05 DIAGNOSIS — M5410 Radiculopathy, site unspecified: Secondary | ICD-10-CM | POA: Insufficient documentation

## 2015-06-05 DIAGNOSIS — I25118 Atherosclerotic heart disease of native coronary artery with other forms of angina pectoris: Secondary | ICD-10-CM | POA: Diagnosis not present

## 2015-06-05 DIAGNOSIS — I209 Angina pectoris, unspecified: Secondary | ICD-10-CM

## 2015-06-05 DIAGNOSIS — G629 Polyneuropathy, unspecified: Secondary | ICD-10-CM | POA: Diagnosis not present

## 2015-06-05 DIAGNOSIS — G4731 Primary central sleep apnea: Secondary | ICD-10-CM | POA: Insufficient documentation

## 2015-06-05 DIAGNOSIS — J302 Other seasonal allergic rhinitis: Secondary | ICD-10-CM | POA: Insufficient documentation

## 2015-06-05 DIAGNOSIS — K219 Gastro-esophageal reflux disease without esophagitis: Secondary | ICD-10-CM | POA: Insufficient documentation

## 2015-06-05 DIAGNOSIS — N529 Male erectile dysfunction, unspecified: Secondary | ICD-10-CM | POA: Insufficient documentation

## 2015-06-05 DIAGNOSIS — M25569 Pain in unspecified knee: Secondary | ICD-10-CM | POA: Insufficient documentation

## 2015-06-05 DIAGNOSIS — G8929 Other chronic pain: Secondary | ICD-10-CM | POA: Insufficient documentation

## 2015-06-05 DIAGNOSIS — Z23 Encounter for immunization: Secondary | ICD-10-CM | POA: Diagnosis not present

## 2015-06-05 LAB — POCT UA - MICROALBUMIN: Microalbumin Ur, POC: 20 mg/L

## 2015-06-05 LAB — POCT GLYCOSYLATED HEMOGLOBIN (HGB A1C): Hemoglobin A1C: 6.1

## 2015-06-05 NOTE — Progress Notes (Signed)
Name: [name] [name]   MRN: [id number]    DOB: 09-Jan-1961   Date:06/05/2015       Progress Note  Subjective  Chief Complaint  Chief Complaint  Patient presents with  . Medication Refill    3 month F/U  . Diabetes    Checks BS twice a day Low- 79 Average-102-105 High-183  . Hypertension    Checks BP at home 117/70 with a brachial cuff, SOB, chest pain has decreased  . Hyperlipidemia  . Back Pain    unchanged     HPI   Angina/CAD: he was seen last week in our office with chest pain, he was referral to Dr. Fletcher Anon, and had a cath done that showed 50% LAD and other minor irregular changes. He had to use NTG twice since hospital discharge, not having chest pain at this time. No nausea or diaphoresis  Hyperlipidemia: he is taking Lipitor 80mg  but LDL was still high back in 03/2015, denies side effects and states he is compliant. Discussed changing medication, but he wants to hold off until next visit, he will resume life style modification  HTN: well controlled, denies side effects and is compliant with medication, he just felt dizzy when he took NTG, otherwise no problems  DMII: he held metformin as recommended after cath, he is otherwise compliant with medications, no side effects, fsbs at home is 102-105 fasting. No episodes of hypoglycemia, we will schedule eye exam, normal feet exam, and he checks his feet at home. Continue aspirin and statin and ARB  Patient Active Problem List   Diagnosis Date Noted  . CAD (coronary artery disease)   . Angina, class III 05/23/2015  . Essential hypertension 05/23/2015  . Hyperlipidemia 05/23/2015  . Inguinal hernia without mention of obstruction or gangrene, recurrent unilateral or unspecified 03/24/2013  . Special screening for malignant neoplasms, colon 03/24/2013    Past Surgical History  Procedure Laterality Date  . Back surgery  12/2008  . Colonoscopy  2014    Dr. [name]  . Hernia repair  1991  . Inguinal hernia repair Right 2012  .  Inguinal hernia repair Right 2014  . Cardiac catheterization N/A 05/30/2015    Procedure: Left Heart Cath;  Surgeon: Wellington Hampshire, MD;  Location: [facility];  Service: Cardiovascular;  Laterality: N/A;    Family History  Problem Relation Age of Onset  . Heart Problems Mother   . Heart Problems Father 67    myocardial infarction  . Mental illness Brother     History   Social History  . Marital Status: Single    Spouse Name: N/A  . Number of Children: N/A  . Years of Education: N/A   Occupational History  . Not on file.   Social History Main Topics  . Smoking status: Former Smoker -- 2 years    Types: Cigarettes    Start date: 12/28/2002    Quit date: 06/04/2005  . Smokeless tobacco: Never Used  . Alcohol Use: 1.2 oz/week    2 Cans of beer per week  . Drug Use: No  . Sexual Activity: Yes   Other Topics Concern  . Not on file   Social History Narrative     Current outpatient prescriptions:  .  amLODipine-valsartan (EXFORGE) 5-160 MG per tablet, Take 1 tablet by mouth daily., Disp: , Rfl:  .  aspirin 81 MG tablet, Take 81 mg by mouth daily., Disp: , Rfl:  .  atorvastatin (LIPITOR) 80 MG tablet, Take  80 mg by mouth daily., Disp: , Rfl:  .  carvedilol (COREG) 12.5 MG tablet, Take 12.5 mg by mouth 2 (two) times daily., Disp: , Rfl:  .  FLECTOR 1.3 % PTCH, Apply 1 patch topically once a week., Disp: , Rfl: 0 .  fluticasone (FLONASE) 50 MCG/ACT nasal spray, Place 2 sprays into the nose as needed., Disp: , Rfl:  .  loratadine (CLARITIN) 10 MG tablet, Take 10 mg by mouth as needed. , Disp: , Rfl:  .  meloxicam (MOBIC) 7.5 MG tablet, Take 7.5 mg by mouth as needed. , Disp: , Rfl:  .  metFORMIN (GLUCOPHAGE) 850 MG tablet, take 1 tablet by mouth once daily, Disp: 30 tablet, Rfl: 2 .  nitroGLYCERIN (NITROSTAT) 0.4 MG SL tablet, Place 1 tablet (0.4 mg total) under the tongue every 5 (five) minutes as needed for chest pain., Disp: 15 tablet, Rfl: 1 .  omeprazole  (PRILOSEC) 20 MG capsule, Take 20 mg by mouth every morning., Disp: , Rfl: 0 .  polyethylene glycol powder (GLYCOLAX/MIRALAX) powder, Take 8 g by mouth as needed. For constipation, Disp: , Rfl: 0 .  sildenafil (VIAGRA) 25 MG tablet, Take 25 mg by mouth daily as needed for erectile dysfunction., Disp: , Rfl:  .  topiramate (TOPAMAX) 25 MG tablet, Take 50 mg by mouth 2 (two) times daily., Disp: , Rfl:  .  traMADol (ULTRAM) 50 MG tablet, Take by mouth every 8 (eight) hours as needed., Disp: , Rfl:   Allergies  Allergen Reactions  . Gabapentin     Groggy-Mood Changes  . Penicillins Hives and Rash     ROS  Constitutional: Negative for fever he has gained weight , stopped walking since last week, worried about heart disease Respiratory: Negative for cough and shortness of breath.   Cardiovascular: last episode of chest pain was three days ago, resolved with nitroglycerine Gastrointestinal: Negative for abdominal pain, no bowel changes.  Musculoskeletal: Negative for gait problem or joint swelling.  Skin: doing well, no recent rashs Neurological: Negative for dizziness or headache.  No other specific complaints in a complete review of systems (except as listed in HPI above).  Objective  Filed Vitals:   06/05/15 1009  BP: 118/76  Pulse: 78  Temp: 98.1 F (36.7 C)  TempSrc: Oral  Resp: 18  Height: 5' 11.75" (1.822 m)  Weight: 260 lb 6.4 oz (118.117 kg)  SpO2: 94%    Physical Exam   Constitutional: Patient appears well-developed and well-nourished. No distress.  HENT: Head: Normocephalic and atraumatic.  Nose: Nose normal. Mouth/Throat: Oropharynx is clear and moist. No oropharyngeal exudate.  Eyes: Conjunctivae and EOM are normal. Pupils are equal, round, and reactive to light. No scleral icterus.  Neck: Normal range of motion. Neck supple. No JVD present. No thyromegaly present.  Cardiovascular: Normal rate, regular rhythm and normal heart sounds.  No murmur heard. No BLE  edema. Pulmonary/Chest: Effort normal and breath sounds normal. No respiratory distress. Abdominal: Soft. Bowel sounds are normal, no distension. There is no tenderness. no masses Musculoskeletal: Normal range of motion, no joint effusions. No gross deformities Neurological: he is alert and oriented to person, place, and time. No cranial nerve deficit. Coordination, balance, strength, speech and gait are normal.  Skin: Skin is warm and dry. No rash noted. No erythema.  Psychiatric: Patient has a normal mood and affect. behavior is normal. Judgment and thought content normal.  Recent Results (from the past 2160 hour(s))  Basic Metabolic Panel (BMET)  Status: Abnormal   Collection Time: 05/23/15  2:31 PM  Result Value Ref Range   Glucose 119 (H) 65 - 99 mg/dL   BUN 9 6 - 24 mg/dL   Creatinine, Ser 1.17 0.76 - 1.27 mg/dL   GFR calc non Af Amer 71 >59 mL/min/1.73   GFR calc Af Amer 82 >59 mL/min/1.73   BUN/Creatinine Ratio 8 (L) 9 - 20   Sodium 141 134 - 144 mmol/L   Potassium 3.9 3.5 - 5.2 mmol/L   Chloride 101 97 - 108 mmol/L   CO2 24 18 - 29 mmol/L   Calcium 9.2 8.7 - 10.2 mg/dL  INR/PT     Status: None   Collection Time: 05/23/15  2:31 PM  Result Value Ref Range   INR 1.0 0.8 - 1.2    Comment: Reference interval is for non-anticoagulated patients. Suggested INR therapeutic range for Vitamin K antagonist therapy:    Standard Dose (moderate intensity                   therapeutic range):       2.0 - 3.0    Higher intensity therapeutic range       2.5 - 3.5    Prothrombin Time 10.4 9.1 - 12.0 sec  CBC     Status: None   Collection Time: 05/23/15  2:31 PM  Result Value Ref Range   WBC 9.0 3.4 - 10.8 x10E3/uL   RBC 5.09 4.14 - 5.80 x10E6/uL   Hemoglobin 16.5 12.6 - 17.7 g/dL   Hematocrit 47.7 37.5 - 51.0 %   MCV 94 79 - 97 fL   MCH 32.4 26.6 - 33.0 pg   MCHC 34.6 31.5 - 35.7 g/dL   RDW 13.8 12.3 - 15.4 %   Platelets 228 150 - 379 x10E3/uL  POCT HgB A1C     Status: None    Collection Time: 06/05/15 10:04 AM  Result Value Ref Range   Hemoglobin A1C 6.1    Diabetic Foot Exam - Simple   Simple Foot Form  Visual Inspection  No deformities, no ulcerations, no other skin breakdown bilaterally:  Yes  Sensation Testing  Intact to touch and monofilament testing bilaterally:  Yes  Pulse Check  Posterior Tibialis and Dorsalis pulse intact bilaterally:  Yes  Comments     Fall Risk: Fall Risk  06/05/2015  Falls in the past year? No   Depression screen PHQ 2/9 06/05/2015  Decreased Interest 0  Down, Depressed, Hopeless 0  PHQ - 2 Score 0     Assessment & Plan  1. Diabetic polyneuropathy associated with type 2 diabetes mellitus Neuropathy is better on legs, hgbA1C is at goal, continue medication, resume life style modification - POCT HgB A1C - POCT UA - Microalbumin - Ambulatory referral to Ophthalmology  2. Need for pneumococcal vaccine  - Pneumococcal polysaccharide vaccine 23-valent greater than or equal to 2yo subcutaneous/IM  3. Hyperlipidemia Recheck next visit, Continue Atorvastatin 80 for now, but we may need to change to Crestor 40 if still elevated   4. Angina, class III S/p cath, on NTG prn, keep appointment with Dr. Fletcher Anon. Try to not take nsaid's , try Tylenol instead  5. 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 Neuropathy Secondary to DM, doing better, normal feet exam

## 2015-06-05 NOTE — Patient Instructions (Signed)
Aspirin and Your Heart Aspirin affects the way your blood clots and helps "thin" the blood. Aspirin has many uses in heart disease. It may be used as a primary prevention to help reduce the risk of heart related events. It also can be used as a secondary measure to prevent more heart attacks or to prevent additional damage from blood clots.  ASPIRIN MAY HELP IF YOU:  Have had a heart attack or chest pain.  Have undergone open heart surgery such as CABG (Coronary Artery Bypass Surgery).  Have had coronary angioplasty with or without stents.  Have experienced a stroke or TIA (transient ischemic attack).  Have peripheral vascular disease (PAD).  Have chronic heart rhythm problems such as atrial fibrillation.  Are at risk for heart disease. BEFORE STARTING ASPIRIN Before you start taking aspirin, your caregiver will need to review your medical history. Many things will need to be taken into consideration, such as:  Smoking status.  Blood pressure.  Diabetes.  Gender.  Weight.  Cholesterol level. ASPIRIN DOSES  Aspirin should only be taken on the advice of your caregiver. Talk to your caregiver about how much aspirin you should take. Aspirin comes in different doses such as:  81 mg.  162 mg.  325 mg.  The aspirin dose you take may be affected by many factors, some of which include:  Your current medications, especially if your are taking blood-thinners or anti-platelet medicine.  Liver function.  Heart disease risk.  Age.  Aspirin comes in two forms:  Non-enteric-coated. This type of aspirin does not have a coating and is absorbed faster. Non-enteric coated aspirin is recommended for patients experiencing chest pain symptoms. This type of aspirin also comes in a chewable form.  Enteric-coated. This means the aspirin has a special coating that releases the medicine very slowly. Enteric-coated aspirin causes less stomach upset. This type of aspirin should not be chewed  or crushed. ASPIRIN SIDE EFFECTS Daily use of aspirin can increase your risk of serious side effects. Some of these include:  Increased bleeding. This can range from a cut that does not stop bleeding to more serious problems such as stomach bleeding or bleeding into the brain (Intracerebral bleeding).  Increased bruising.  Stomach upset.  An allergic reaction such as red, itchy skin.  Increased risk of bleeding when combined with non-steroidal anti-inflammatory medicine (NSAIDS).  Alcohol should be drank in moderation when taking aspirin. Alcohol can increase the risk of stomach bleeding when taken with aspirin.  Aspirin should not be given to children less than 34 years of age due to the association of Reye syndrome. Reye syndrome is a serious illness that can affect the brain and liver. Studies have linked Reye syndrome with aspirin use in children.  People that have nasal polyps have an increased risk of developing an aspirin allergy. SEEK MEDICAL CARE IF:   You develop an allergic reaction such as:  Hives.  Itchy skin.  Swelling of the lips, tongue or face.  You develop stomach pain.  You have unusual bleeding or bruising.  You have ringing in your ears. SEEK IMMEDIATE MEDICAL CARE IF:   You have severe chest pain, especially if the pain is crushing or pressure-like and spreads to the arms, back, neck, or jaw. THIS IS AN EMERGENCY. Do not wait to see if the pain will go away. Get medical help at once. Call your local emergency services (911 in the [place] DO NOT drive yourself to the hospital.  You have stroke-like symptoms  such as:  Loss of vision.  Difficulty talking.  Numbness or weakness on one side of your body.  Numbness or weakness in your arm or leg.  Not thinking clearly or feeling confused.  Your bowel movements are bloody, dark red or black in color.  You vomit or cough up blood.  You have blood in your urine.  You have shortness of breath,  coughing or wheezing. MAKE SURE YOU:   Understand these instructions.  Will monitor your condition.  Seek immediate medical care if necessary. Document Released: 11/26/2008 Document Revised: 04/10/2013 Document Reviewed: 03/21/2014 [facility] Patient Information 2015 [place], [place]. This information is not intended to replace advice given to you by your health care provider. Make sure you discuss any questions you have with your health care provider.

## 2015-06-06 ENCOUNTER — Encounter: Payer: Self-pay | Admitting: Family Medicine

## 2015-06-10 ENCOUNTER — Ambulatory Visit: Payer: Medicaid Other | Admitting: Cardiovascular Disease

## 2015-06-14 NOTE — Telephone Encounter (Signed)
v

## 2015-06-20 ENCOUNTER — Ambulatory Visit (INDEPENDENT_AMBULATORY_CARE_PROVIDER_SITE_OTHER): Payer: Medicaid Other | Admitting: Cardiovascular Disease

## 2015-06-20 ENCOUNTER — Encounter: Payer: Self-pay | Admitting: Cardiovascular Disease

## 2015-06-20 VITALS — BP 120/86 | HR 61 | Ht 71.0 in | Wt 262.0 lb

## 2015-06-20 DIAGNOSIS — E785 Hyperlipidemia, unspecified: Secondary | ICD-10-CM | POA: Diagnosis not present

## 2015-06-20 DIAGNOSIS — I10 Essential (primary) hypertension: Secondary | ICD-10-CM | POA: Diagnosis not present

## 2015-06-20 DIAGNOSIS — I25118 Atherosclerotic heart disease of native coronary artery with other forms of angina pectoris: Secondary | ICD-10-CM

## 2015-06-20 DIAGNOSIS — R0789 Other chest pain: Secondary | ICD-10-CM | POA: Diagnosis not present

## 2015-06-20 MED ORDER — RANOLAZINE ER 500 MG PO TB12: 500.0000 mg | ORAL_TABLET | Freq: Two times a day (BID) | ORAL | Status: DC

## 2015-06-20 NOTE — Assessment & Plan Note (Signed)
Cardiac catheterization showed moderate one-vessel coronary artery disease which was not significant by pressure wire interrogation. Nonetheless, his symptoms are highly suggestive of effort angina. He reports improvement in symptoms and currently he is in [place] class II. It is possible that he might have endothelial dysfunction and microvascular disease. He uses sildenafil as needed for erectile dysfunction. Thus, I am hesitant to start him on long-acting nitroglycerin. Thus, I elected to start him on Ranexa 500 mg twice daily. I asked him to resume his physical activities and try to be more active. I recommended an exercise program. Continue aggressive treatment of risk factors.

## 2015-06-20 NOTE — Assessment & Plan Note (Signed)
Continue treatment with atorvastatin with a target LDL of less than 70. He will require a fasting lipid and liver profile this year.

## 2015-06-20 NOTE — Patient Instructions (Signed)
Medication Instructions:  Your physician has recommended you make the following change in your medication:  START taking Ranexa 500mg  twice per day   Labwork: none  Testing/Procedures: none  Follow-Up: Your physician recommends that you schedule a follow-up appointment in: one month with Dr. Fletcher Anon.    Any Other Special Instructions Will Be Listed Below (If Applicable).

## 2015-06-20 NOTE — Progress Notes (Signed)
Primary care physician: Dr. Ancil Boozer  HPI  This is a pleasant 54 year old male who is here today for a follow-up visit regarding exertional chest pain. He has known history of type 2 diabetes, hypertension, hyperlipidemia and obesity.He is a previous smoker. He was seen recently for substernal chest heaviness with activities. He has family history of coronary artery disease. His father died of myocardial infarction at the age of 82. I proceeded with cardiac catheterization which showed moderate mid LAD stenosis (50%) with an FFR ratio of 0.83 . LV systolic function with normal with normal left ventricular end-diastolic pressure. He has been feeling better. However, he continues to complain of mild exertional chest pain.  Allergies  Allergen Reactions  . Gabapentin     Groggy-Mood Changes  . Latex   . Penicillins Hives and Rash     Current Outpatient Prescriptions on File Prior to Visit  Medication Sig Dispense Refill  . amLODipine-valsartan (EXFORGE) 5-160 MG per tablet Take 1 tablet by mouth daily.    Marland Kitchen aspirin 81 MG tablet Take 81 mg by mouth daily.    Marland Kitchen atorvastatin (LIPITOR) 80 MG tablet Take 80 mg by mouth daily.    . carvedilol (COREG) 12.5 MG tablet Take 12.5 mg by mouth 2 (two) times daily.    Marland Kitchen FLECTOR 1.3 % PTCH Apply 1 patch topically once a week.  0  . fluticasone (FLONASE) 50 MCG/ACT nasal spray Place 2 sprays into the nose as needed.    . loratadine (CLARITIN) 10 MG tablet Take 10 mg by mouth as needed.     . meloxicam (MOBIC) 7.5 MG tablet Take 7.5 mg by mouth as needed.     . metFORMIN (GLUCOPHAGE) 850 MG tablet take 1 tablet by mouth once daily 30 tablet 2  . nitroGLYCERIN (NITROSTAT) 0.4 MG SL tablet Place 1 tablet (0.4 mg total) under the tongue every 5 (five) minutes as needed for chest pain. 15 tablet 1  . omeprazole (PRILOSEC) 20 MG capsule Take 20 mg by mouth every morning.  0  . polyethylene glycol powder (GLYCOLAX/MIRALAX) powder Take 8 g by mouth as needed. For  constipation  0  . sildenafil (VIAGRA) 25 MG tablet Take 25 mg by mouth daily as needed for erectile dysfunction.    . topiramate (TOPAMAX) 25 MG tablet Take 50 mg by mouth 2 (two) times daily.    . traMADol (ULTRAM) 50 MG tablet Take by mouth every 8 (eight) hours as needed.     No current facility-administered medications on file prior to visit.     Past Medical History  Diagnosis Date  . Hernia 1991    02/10/2012-RIH repair  . Personal history of tobacco use, presenting hazards to health 2012  . Hypertension 2008  . Inguinal hernia without mention of obstruction or gangrene, recurrent unilateral or unspecified 2012  . Obesity, unspecified 2012  . Umbilical hernia without mention of obstruction or gangrene 2012  . Diabetes mellitus without complication   . Hyperlipidemia   . Degeneration of intervertebral disc of cervical region   . Sleep apnea   . CAD (coronary artery disease)     a. cardiac cath 05/30/2015: LM nl, mLAD 50%, LCx minor irregs, RCA minor irregs, EF 55-65%, no WMA, no MR/AS, nl LVEDP, recommend aggressive med Rx  . Allergy      Past Surgical History  Procedure Laterality Date  . Back surgery  12/2008  . Colonoscopy  2014    Dr. [name]  . Hernia repair  1991  .  Inguinal hernia repair Right 2012  . Inguinal hernia repair Right 2014  . Cardiac catheterization N/A 05/30/2015    Procedure: Left Heart Cath;  Surgeon: Wellington Hampshire, MD;  Location: [facility];  Service: Cardiovascular;  Laterality: N/A;     Family History  Problem Relation Age of Onset  . Heart Problems Mother   . Heart Problems Father 5    myocardial infarction  . Mental illness Brother      History   Social History  . Marital Status: Single    Spouse Name: N/A  . Number of Children: N/A  . Years of Education: N/A   Occupational History  . Not on file.   Social History Main Topics  . Smoking status: Former Smoker -- 2 years    Types: Cigarettes    Start date:  12/28/2002    Quit date: 06/04/2005  . Smokeless tobacco: Never Used  . Alcohol Use: 1.2 oz/week    2 Cans of beer per week  . Drug Use: No  . Sexual Activity: Yes   Other Topics Concern  . Not on file   Social History Narrative     ROS A 10 point review of system was performed. It is negative other than that mentioned in the history of present illness.   PHYSICAL EXAM   BP 120/86 mmHg  Pulse 61  Ht 5\' 11"  (1.803 m)  Wt 262 lb (118.842 kg)  BMI 36.56 kg/m2 Constitutional: He is oriented to person, place, and time. He appears well-developed and well-nourished. No distress.  HENT: No nasal discharge.  Head: Normocephalic and atraumatic.  Eyes: Pupils are equal and round.  No discharge. Neck: Normal range of motion. Neck supple. No JVD present. No thyromegaly present.  Cardiovascular: Normal rate, regular rhythm, normal heart sounds. Exam reveals no gallop and no friction rub. No murmur heard.  Pulmonary/Chest: Effort normal and breath sounds normal. No stridor. No respiratory distress. He has no wheezes. He has no rales. He exhibits no tenderness.  Abdominal: Soft. Bowel sounds are normal. He exhibits no distension. There is no tenderness. There is no rebound and no guarding.  Musculoskeletal: Normal range of motion. He exhibits no edema and no tenderness.  Neurological: He is alert and oriented to person, place, and time. Coordination normal.  Skin: Skin is warm and dry. No rash noted. He is not diaphoretic. No erythema. No pallor.  Psychiatric: He has a normal mood and affect. His behavior is normal. Judgment and thought content normal.       EKG: Sinus  Rhythm  Nonspecific ST depression   ASSESSMENT AND PLAN

## 2015-06-20 NOTE — Assessment & Plan Note (Signed)
Blood pressure is well controlled on current medications. 

## 2015-07-04 ENCOUNTER — Ambulatory Visit: Payer: Medicaid Other | Admitting: Cardiovascular Disease

## 2015-07-08 ENCOUNTER — Other Ambulatory Visit: Payer: Self-pay | Admitting: Family Medicine

## 2015-07-08 NOTE — Telephone Encounter (Signed)
Patient requesting refill. 

## 2015-07-17 ENCOUNTER — Other Ambulatory Visit: Payer: Self-pay

## 2015-07-17 MED ORDER — ARMODAFINIL 150 MG PO TABS
150.0000 mg | ORAL_TABLET | Freq: Every day | ORAL | Status: DC
Start: 2015-07-17 — End: 2015-12-05

## 2015-07-17 NOTE — Telephone Encounter (Signed)
Pt would like to be put back on Nuvigil

## 2015-08-08 ENCOUNTER — Ambulatory Visit (INDEPENDENT_AMBULATORY_CARE_PROVIDER_SITE_OTHER): Payer: Medicaid Other | Admitting: Cardiovascular Disease

## 2015-08-08 ENCOUNTER — Other Ambulatory Visit: Payer: Self-pay

## 2015-08-08 ENCOUNTER — Encounter: Payer: Self-pay | Admitting: Cardiovascular Disease

## 2015-08-08 ENCOUNTER — Other Ambulatory Visit: Payer: Self-pay | Admitting: Family Medicine

## 2015-08-08 ENCOUNTER — Telehealth: Payer: Self-pay | Admitting: Cardiovascular Disease

## 2015-08-08 VITALS — BP 114/88 | HR 62 | Ht 71.0 in | Wt 260.5 lb

## 2015-08-08 DIAGNOSIS — E785 Hyperlipidemia, unspecified: Secondary | ICD-10-CM | POA: Diagnosis not present

## 2015-08-08 DIAGNOSIS — R079 Chest pain, unspecified: Secondary | ICD-10-CM

## 2015-08-08 DIAGNOSIS — I25118 Atherosclerotic heart disease of native coronary artery with other forms of angina pectoris: Secondary | ICD-10-CM

## 2015-08-08 DIAGNOSIS — I10 Essential (primary) hypertension: Secondary | ICD-10-CM

## 2015-08-08 MED ORDER — RANOLAZINE ER 1000 MG PO TB12
1000.0000 mg | ORAL_TABLET | Freq: Two times a day (BID) | ORAL | Status: DC
Start: 2015-08-08 — End: 2016-06-05

## 2015-08-08 MED ORDER — RANOLAZINE ER 1000 MG PO TB12: 1000.0000 mg | ORAL_TABLET | Freq: Two times a day (BID) | ORAL | Status: DC

## 2015-08-08 NOTE — Telephone Encounter (Signed)
Clarify Renexa Rx for Pharmacy

## 2015-08-08 NOTE — Telephone Encounter (Signed)
Updated pt refill

## 2015-08-08 NOTE — Progress Notes (Signed)
Primary care physician: Dr. Ancil Boozer  HPI  This is a pleasant 54 year old male who is here today for a follow-up visit regarding mild angina and nonobstructive coronary artery disease. He has known history of type 2 diabetes, hypertension, hyperlipidemia and obesity.He is a previous smoker. He was seen recently for substernal chest heaviness with activities. He has family history of coronary artery disease. His father died of myocardial infarction at the age of 8. I proceeded with cardiac catheterization which showed moderate mid LAD stenosis (50%) with an FFR ratio of 0.83 . LV systolic function with normal with normal left ventricular end-diastolic pressure. During last visit, I started him on Ranexa. He reports improvement in anginal symptoms and feels better overall.  Allergies  Allergen Reactions  . Gabapentin     Groggy-Mood Changes  . Latex   . Penicillins Hives and Rash     Current Outpatient Prescriptions on File Prior to Visit  Medication Sig Dispense Refill  . amLODipine-valsartan (EXFORGE) 5-160 MG per tablet Take 1 tablet by mouth daily.    . Armodafinil (NUVIGIL) 150 MG tablet Take 1 tablet (150 mg total) by mouth daily. 30 tablet 2  . aspirin 81 MG tablet Take 81 mg by mouth daily.    Marland Kitchen atorvastatin (LIPITOR) 80 MG tablet Take 80 mg by mouth daily.    . carvedilol (COREG) 12.5 MG tablet Take 12.5 mg by mouth 2 (two) times daily.    Marland Kitchen FLECTOR 1.3 % PTCH Apply 1 patch topically once a week.  0  . fluticasone (FLONASE) 50 MCG/ACT nasal spray Place 2 sprays into the nose as needed.    . loratadine (CLARITIN) 10 MG tablet Take 10 mg by mouth as needed.     . meloxicam (MOBIC) 7.5 MG tablet Take 7.5 mg by mouth as needed.     . metFORMIN (GLUCOPHAGE) 850 MG tablet take 1 tablet by mouth once daily 30 tablet 2  . nitroGLYCERIN (NITROSTAT) 0.4 MG SL tablet Place 1 tablet (0.4 mg total) under the tongue every 5 (five) minutes as needed for chest pain. 15 tablet 1  . omeprazole  (PRILOSEC) 20 MG capsule Take 20 mg by mouth every morning.  0  . polyethylene glycol powder (GLYCOLAX/MIRALAX) powder Take 8 g by mouth as needed. For constipation  0  . ranolazine (RANEXA) 500 MG 12 hr tablet Take 1 tablet (500 mg total) by mouth 2 (two) times daily. 60 tablet 5  . sildenafil (REVATIO) 20 MG tablet take 1 to 2 tablets by mouth once daily if needed 60 tablet 2  . sildenafil (VIAGRA) 25 MG tablet Take 25 mg by mouth daily as needed for erectile dysfunction.    . topiramate (TOPAMAX) 25 MG tablet Take 50 mg by mouth 2 (two) times daily.    . traMADol (ULTRAM) 50 MG tablet Take by mouth every 8 (eight) hours as needed.     No current facility-administered medications on file prior to visit.     Past Medical History  Diagnosis Date  . Hernia 1991    02/10/2012-RIH repair  . Personal history of tobacco use, presenting hazards to health 2012  . Hypertension 2008  . Inguinal hernia without mention of obstruction or gangrene, recurrent unilateral or unspecified 2012  . Obesity, unspecified 2012  . Umbilical hernia without mention of obstruction or gangrene 2012  . Diabetes mellitus without complication   . Hyperlipidemia   . Degeneration of intervertebral disc of cervical region   . Sleep apnea   .  CAD (coronary artery disease)     a. cardiac cath 05/30/2015: LM nl, mLAD 50%, LCx minor irregs, RCA minor irregs, EF 55-65%, no WMA, no MR/AS, nl LVEDP, recommend aggressive med Rx  . Allergy      Past Surgical History  Procedure Laterality Date  . Back surgery  12/2008  . Colonoscopy  2014    Dr. [name]  . Hernia repair  1991  . Inguinal hernia repair Right 2012  . Inguinal hernia repair Right 2014  . Cardiac catheterization N/A 05/30/2015    Procedure: Left Heart Cath;  Surgeon: Wellington Hampshire, MD;  Location: [facility];  Service: Cardiovascular;  Laterality: N/A;     Family History  Problem Relation Age of Onset  . Heart Problems Mother   . Heart Problems  Father 63    myocardial infarction  . Mental illness Brother      Social History   Social History  . Marital Status: Single    Spouse Name: N/A  . Number of Children: N/A  . Years of Education: N/A   Occupational History  . Not on file.   Social History Main Topics  . Smoking status: Former Smoker -- 2 years    Types: Cigarettes    Start date: 12/28/2002    Quit date: 06/04/2005  . Smokeless tobacco: Never Used  . Alcohol Use: 1.2 oz/week    2 Cans of beer per week  . Drug Use: No  . Sexual Activity: Yes   Other Topics Concern  . Not on file   Social History Narrative     ROS A 10 point review of system was performed. It is negative other than that mentioned in the history of present illness.   PHYSICAL EXAM   BP 114/88 mmHg  Pulse 62  Ht 5\' 11"  (1.803 m)  Wt 260 lb 8 oz (118.162 kg)  BMI 36.35 kg/m2 Constitutional: He is oriented to person, place, and time. He appears well-developed and well-nourished. No distress.  HENT: No nasal discharge.  Head: Normocephalic and atraumatic.  Eyes: Pupils are equal and round.  No discharge. Neck: Normal range of motion. Neck supple. No JVD present. No thyromegaly present.  Cardiovascular: Normal rate, regular rhythm, normal heart sounds. Exam reveals no gallop and no friction rub. No murmur heard.  Pulmonary/Chest: Effort normal and breath sounds normal. No stridor. No respiratory distress. He has no wheezes. He has no rales. He exhibits no tenderness.  Abdominal: Soft. Bowel sounds are normal. He exhibits no distension. There is no tenderness. There is no rebound and no guarding.  Musculoskeletal: Normal range of motion. He exhibits no edema and no tenderness.  Neurological: He is alert and oriented to person, place, and time. Coordination normal.  Skin: Skin is warm and dry. No rash noted. He is not diaphoretic. No erythema. No pallor.  Psychiatric: He has a normal mood and affect. His behavior is normal. Judgment and  thought content normal.       EKG: Sinus  Rhythm  WITHIN NORMAL LIMITS  ASSESSMENT AND PLAN

## 2015-08-08 NOTE — Patient Instructions (Signed)
Medication Instructions:  Your physician has recommended you make the following change in your medication:  INCREASE ranexa to 1000mg  twice a day   Labwork: none  Testing/Procedures: none  Follow-Up: Your physician wants you to follow-up in: six months with Dr. Fletcher Anon. You will receive a reminder letter in the mail two months in advance. If you don't receive a letter, please call our office to schedule the follow-up appointment.   Any Other Special Instructions Will Be Listed Below (If Applicable).

## 2015-08-08 NOTE — Telephone Encounter (Signed)
See note below please.  

## 2015-08-08 NOTE — Assessment & Plan Note (Signed)
Blood pressure is well controlled on current medication.

## 2015-08-08 NOTE — Telephone Encounter (Signed)
Patient requesting refill. 

## 2015-08-08 NOTE — Assessment & Plan Note (Signed)
Lab Results  Component Value Date   CHOL 222* 05/23/2014   HDL 46 05/23/2014   LDLCALC 145 05/23/2014   TRIG 155 05/23/2014   Continue high dose atorvastatin. I recommend a target LDL of less than 70.

## 2015-08-08 NOTE — Assessment & Plan Note (Addendum)
The patient's mild angina improved with Ranexa. I increased the dose to 1000 mg twice daily. Continue aggressive medical therapy. The patient might need to have back surgery in the near future. He is considered at low risk from a cardiac standpoint. No ischemic testing is needed.

## 2015-09-03 ENCOUNTER — Encounter: Payer: Self-pay | Admitting: Family Medicine

## 2015-09-04 ENCOUNTER — Other Ambulatory Visit: Payer: Self-pay | Admitting: Family Medicine

## 2015-09-04 NOTE — Telephone Encounter (Signed)
Patient requesting refill. 

## 2015-09-05 ENCOUNTER — Encounter: Payer: Self-pay | Admitting: Family Medicine

## 2015-09-05 ENCOUNTER — Ambulatory Visit (INDEPENDENT_AMBULATORY_CARE_PROVIDER_SITE_OTHER): Payer: Medicaid Other | Admitting: Family Medicine

## 2015-09-05 VITALS — BP 106/64 | HR 64 | Temp 98.0°F | Resp 18 | Ht 71.0 in | Wt 259.1 lb

## 2015-09-05 DIAGNOSIS — Z1159 Encounter for screening for other viral diseases: Secondary | ICD-10-CM

## 2015-09-05 DIAGNOSIS — K219 Gastro-esophageal reflux disease without esophagitis: Secondary | ICD-10-CM

## 2015-09-05 DIAGNOSIS — I2510 Atherosclerotic heart disease of native coronary artery without angina pectoris: Secondary | ICD-10-CM

## 2015-09-05 DIAGNOSIS — Z23 Encounter for immunization: Secondary | ICD-10-CM

## 2015-09-05 DIAGNOSIS — D751 Secondary polycythemia: Secondary | ICD-10-CM | POA: Diagnosis not present

## 2015-09-05 DIAGNOSIS — E1140 Type 2 diabetes mellitus with diabetic neuropathy, unspecified: Secondary | ICD-10-CM

## 2015-09-05 DIAGNOSIS — G4731 Primary central sleep apnea: Secondary | ICD-10-CM

## 2015-09-05 DIAGNOSIS — E785 Hyperlipidemia, unspecified: Secondary | ICD-10-CM | POA: Diagnosis not present

## 2015-09-05 DIAGNOSIS — I2583 Coronary atherosclerosis due to lipid rich plaque: Secondary | ICD-10-CM

## 2015-09-05 DIAGNOSIS — I10 Essential (primary) hypertension: Secondary | ICD-10-CM | POA: Diagnosis not present

## 2015-09-05 LAB — POCT GLYCOSYLATED HEMOGLOBIN (HGB A1C): Hemoglobin A1C: 5.9

## 2015-09-05 NOTE — Progress Notes (Signed)
Name: [name] [name]   MRN: [id number]    DOB: [birth year]   Date:09/05/2015       Progress Note  Subjective  Chief Complaint  Chief Complaint  Patient presents with  . Medication Refill    3 month F/U  . Diabetes    Checks BS bid, Low- 47 Average-105 Highest-163  . Hypertension    No problems, Checks BS at home-130/70  . Hyperlipidemia    No problems  . Allergic Rhinitis     Unchanged- worst with the cooler weather  . Gastrophageal Reflux    Well Controlled  . Neck Pain    Worsen-going to [facility], doing another MRI on the 20th, now has a knot on his left shoulder blade.    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 Taking Metformin and hgbA1C is well controlled. Diagnosed in 07/2014. He is following a diabetic diet.  Denies polyphagia, polydipsia and polyuria  HTN: doing well, bp is towards the low end of normal, occasionally has orthostatic hypotension, advised to get up slowly and drink enough fluids  Hyperlipidemia: taking Atorvastatin no side effects and we will recheck labs  GERD: under control with medication   CAD: he is no longer having chest pain, sees cardiologist - Dr. Fletcher Anon - on Ranexa and is doing well, on statin, aspirin and beta-blocker.   Chronic neck pain with radiculitis: having pain daily, using patches, seeing neurosurgeon in a couple weeks at [facility]  Patient Active Problem List   Diagnosis Date Noted  . Patellar subluxation 06/05/2015  . Allergic rhinitis, seasonal 06/05/2015  . Chronic constipation 06/05/2015  . Chronic LBP 06/05/2015  . Obesity (BMI 30-39.9) 06/05/2015  . Vitamin D deficiency 06/05/2015  . Central sleep apnea 06/05/2015  . 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 Nerve root pain 06/05/2015  . Acquired polycythemia 06/05/2015  . Anterior knee pain 06/05/2015  . Dysmetabolic syndrome 75/17/0017  . Failure of erection 06/05/2015  .  Gastro-esophageal reflux disease without esophagitis 06/05/2015  . Neuropathy 06/05/2015  . CAD (coronary artery disease)   . Angina, class III 05/23/2015  . Essential hypertension 05/23/2015  . Hyperlipidemia 05/23/2015  . Inguinal hernia without mention of obstruction or gangrene, recurrent unilateral or unspecified 03/24/2013    Past Surgical History  Procedure Laterality Date  . Back surgery  12/2008  . Colonoscopy  2014    Dr. [name]  . Hernia repair  1991  . Inguinal hernia repair Right 2012  . Inguinal hernia repair Right 2014  . Cardiac catheterization N/A 05/30/2015    Procedure: Left Heart Cath;  Surgeon: Wellington Hampshire, MD;  Location: [facility];  Service: Cardiovascular;  Laterality: N/A;    Family History  Problem Relation Age of Onset  . Heart Problems Mother   . Heart Problems Father 47    myocardial infarction  . Mental illness Brother     Social History   Social History  . Marital Status: Single    Spouse Name: N/A  . Number of Children: N/A  . Years of Education: N/A   Occupational History  . Not on file.   Social History Main Topics  . Smoking status: Former Smoker -- 2 years    Types: Cigars    Start date: 12/28/2002    Quit date: 06/04/2005  . Smokeless tobacco: Never Used  .  Alcohol Use: 1.2 oz/week    2 Cans of beer per week  . Drug Use: No  . Sexual Activity:    Partners: Female   Other Topics Concern  . Not on file   Social History Narrative     Current outpatient prescriptions:  .  amLODipine-valsartan (EXFORGE) 5-160 MG per tablet, Take 1 tablet by mouth daily., Disp: , Rfl:  .  Armodafinil (NUVIGIL) 150 MG tablet, Take 1 tablet (150 mg total) by mouth daily., Disp: 30 tablet, Rfl: 2 .  aspirin 81 MG tablet, Take 81 mg by mouth daily., Disp: , Rfl:  .  atorvastatin (LIPITOR) 80 MG tablet, take 1 tablet by mouth once daily, Disp: 30 tablet, Rfl: 5 .  carvedilol (COREG) 12.5 MG tablet, Take 12.5 mg by mouth 2 (two)  times daily., Disp: , Rfl:  .  FLECTOR 1.3 % PTCH, Apply 1 patch topically once a week., Disp: , Rfl: 0 .  fluticasone (FLONASE) 50 MCG/ACT nasal spray, Place 2 sprays into the nose as needed., Disp: , Rfl:  .  loratadine (CLARITIN) 10 MG tablet, Take 10 mg by mouth as needed. , Disp: , Rfl:  .  metFORMIN (GLUCOPHAGE) 850 MG tablet, take 1 tablet by mouth once daily, Disp: 30 tablet, Rfl: 2 .  nitroGLYCERIN (NITROSTAT) 0.4 MG SL tablet, Place 1 tablet (0.4 mg total) under the tongue every 5 (five) minutes as needed for chest pain., Disp: 15 tablet, Rfl: 1 .  omeprazole (PRILOSEC) 20 MG capsule, take 1 capsule by mouth every morning, Disp: 30 capsule, Rfl: 2 .  polyethylene glycol powder (GLYCOLAX/MIRALAX) powder, Take 8 g by mouth as needed. For constipation, Disp: , Rfl: 0 .  ranolazine (RANEXA) 1000 MG SR tablet, Take 1 tablet (1,000 mg total) by mouth 2 (two) times daily., Disp: 60 tablet, Rfl: 5 .  sildenafil (REVATIO) 20 MG tablet, take 1 to 2 tablets by mouth once daily if needed, Disp: 60 tablet, Rfl: 2  Allergies  Allergen Reactions  . Gabapentin     Groggy-Mood Changes  . Latex   . Penicillins Hives and Rash     ROS  Constitutional: Negative for fever or weight change.  Respiratory: Negative for cough and shortness of breath.   Cardiovascular: Negative for chest pain or palpitations.  Gastrointestinal: Negative for abdominal pain, no bowel changes.  Musculoskeletal: Negative for gait problem or joint swelling.  Skin: Negative for rash.  Neurological: positive  for occasional dizziness and  headache.  No other specific complaints in a complete review of systems (except as listed in HPI above).   Objective  Filed Vitals:   09/05/15 1009  BP: 106/64  Pulse: 64  Temp: 98 F (36.7 C)  TempSrc: Oral  Resp: 18  Height: 5\' 11"  (1.803 m)  Weight: 259 lb 1.6 oz (117.527 kg)  SpO2: 96%    Body mass index is 36.15 kg/(m^2).  Physical Exam  Constitutional: Patient  appears well-developed and well-nourished. Obese  No distress.  HEENT: head atraumatic, normocephalic, pupils equal and reactive to light,neck is tender to palpation supple, throat within normal limits Cardiovascular: Normal rate, regular rhythm and normal heart sounds.  No murmur heard. No BLE edema. Pulmonary/Chest: Effort normal and breath sounds normal. No respiratory distress. Abdominal: Soft.  There is no tenderness. Psychiatric: Patient has a normal mood and affect. behavior is normal. Judgment and thought content normal. Muscular Skeletal: weaker grip on the left hand Neuro: decrease in sensation at C 6 distribution   Recent  Results (from the past 2160 hour(s))  POCT HgB A1C     Status: None   Collection Time: 09/05/15 10:19 AM  Result Value Ref Range   Hemoglobin A1C 5.9       PHQ2/9: Depression screen [facility] 2/9 09/05/2015 06/05/2015  Decreased Interest 0 0  Down, Depressed, Hopeless 0 0  PHQ - 2 Score 0 0     Fall Risk: Fall Risk  09/05/2015 06/05/2015  Falls in the past year? No No      Functional Status Survey: Is the patient deaf or have difficulty hearing?: No Does the patient have difficulty seeing, even when wearing glasses/contacts?: No Does the patient have difficulty concentrating, remembering, or making decisions?: No Does the patient have difficulty walking or climbing stairs?: No Does the patient have difficulty dressing or bathing?: No Does the patient have difficulty doing errands alone such as visiting a doctor's office or shopping?: No    Assessment & Plan  1. Type 2 diabetes mellitus with diabetic neuropathy Doing well, can try weaning self off metformin, continue aspirin - POCT HgB A1C  2. Acquired polycythemia Recheck labs - CBC with Differential/Platelet - Ferritin  3. Gastro-esophageal reflux disease without esophagitis Under control   4. Central sleep apnea Continue CPAP nightly   5. Essential hypertension Discussed importance of  staying hydrated and getting up slowly  - Comprehensive metabolic panel  6. Hyperlipidemia  - Lipid panel  7. Coronary artery disease due to lipid rich plaque On statin, beta-blocker, aspirin and Ranexa  8. Need for hepatitis C screening test  - Hepatitis C antibody  9. Needs flu shot  - Flu Vaccine QUAD 36+ mos IM

## 2015-09-06 LAB — CBC WITH DIFFERENTIAL/PLATELET
BASOS: 0 %
Basophils Absolute: 0 10*3/uL (ref 0.0–0.2)
EOS (ABSOLUTE): 0.2 10*3/uL (ref 0.0–0.4)
EOS: 2 %
HEMATOCRIT: 46.1 % (ref 37.5–51.0)
HEMOGLOBIN: 16.3 g/dL (ref 12.6–17.7)
IMMATURE GRANS (ABS): 0 10*3/uL (ref 0.0–0.1)
IMMATURE GRANULOCYTES: 0 %
Lymphocytes Absolute: 3.1 10*3/uL (ref 0.7–3.1)
Lymphs: 37 %
MCH: 32.5 pg (ref 26.6–33.0)
MCHC: 35.4 g/dL (ref 31.5–35.7)
MCV: 92 fL (ref 79–97)
MONOCYTES: 7 %
MONOS ABS: 0.6 10*3/uL (ref 0.1–0.9)
NEUTROS PCT: 54 %
Neutrophils Absolute: 4.4 10*3/uL (ref 1.4–7.0)
Platelets: 261 10*3/uL (ref 150–379)
RBC: 5.02 x10E6/uL (ref 4.14–5.80)
RDW: 14.3 % (ref 12.3–15.4)
WBC: 8.3 10*3/uL (ref 3.4–10.8)

## 2015-09-06 LAB — LIPID PANEL
CHOLESTEROL TOTAL: 192 mg/dL (ref 100–199)
Chol/HDL Ratio: 3.6 ratio units (ref 0.0–5.0)
HDL: 53 mg/dL (ref >39)
LDL Calculated: 98 mg/dL (ref 0–99)
Triglycerides: 203 mg/dL — ABNORMAL HIGH (ref 0–149)
VLDL CHOLESTEROL CAL: 41 mg/dL — AB (ref 5–40)

## 2015-09-06 LAB — HEPATITIS C ANTIBODY: Hep C Virus Ab: <0.1 s/co ratio (ref 0.0–0.9)

## 2015-09-06 LAB — COMPREHENSIVE METABOLIC PANEL
ALBUMIN: 4.8 g/dL (ref 3.5–5.5)
ALT: 27 IU/L (ref 0–44)
AST: 33 IU/L (ref 0–40)
Albumin/Globulin Ratio: 1.4 (ref 1.1–2.5)
Alkaline Phosphatase: 84 IU/L (ref 39–117)
BUN/Creatinine Ratio: 14 (ref 9–20)
BUN: 15 mg/dL (ref 6–24)
Bilirubin Total: 0.7 mg/dL (ref 0.0–1.2)
CALCIUM: 9.8 mg/dL (ref 8.7–10.2)
CO2: 23 mmol/L (ref 18–29)
CREATININE: 1.09 mg/dL (ref 0.76–1.27)
Chloride: 98 mmol/L (ref 97–108)
GFR calc Af Amer: 89 mL/min/{1.73_m2} (ref >59)
GFR, EST NON AFRICAN AMERICAN: 77 mL/min/{1.73_m2} (ref >59)
GLOBULIN, TOTAL: 3.4 g/dL (ref 1.5–4.5)
Glucose: 108 mg/dL — ABNORMAL HIGH (ref 65–99)
Potassium: 4.1 mmol/L (ref 3.5–5.2)
SODIUM: 140 mmol/L (ref 134–144)
Total Protein: 8.2 g/dL (ref 6.0–8.5)

## 2015-09-06 LAB — FERRITIN: Ferritin: 641 ng/mL — ABNORMAL HIGH (ref 30–400)

## 2015-09-09 ENCOUNTER — Other Ambulatory Visit: Payer: Self-pay | Admitting: Family Medicine

## 2015-09-09 DIAGNOSIS — D751 Secondary polycythemia: Secondary | ICD-10-CM

## 2015-09-19 ENCOUNTER — Inpatient Hospital Stay: Payer: Medicaid Other | Attending: Oncology | Admitting: Oncology

## 2015-09-19 VITALS — BP 120/66 | HR 61 | Temp 96.5°F | Resp 20 | Wt 261.7 lb

## 2015-09-19 DIAGNOSIS — M5030 Other cervical disc degeneration, unspecified cervical region: Secondary | ICD-10-CM

## 2015-09-19 DIAGNOSIS — D751 Secondary polycythemia: Secondary | ICD-10-CM | POA: Insufficient documentation

## 2015-09-19 DIAGNOSIS — Z7982 Long term (current) use of aspirin: Secondary | ICD-10-CM | POA: Diagnosis not present

## 2015-09-19 DIAGNOSIS — Z87891 Personal history of nicotine dependence: Secondary | ICD-10-CM | POA: Diagnosis not present

## 2015-09-19 DIAGNOSIS — E785 Hyperlipidemia, unspecified: Secondary | ICD-10-CM | POA: Diagnosis not present

## 2015-09-19 DIAGNOSIS — Z79899 Other long term (current) drug therapy: Secondary | ICD-10-CM | POA: Insufficient documentation

## 2015-09-19 DIAGNOSIS — G4730 Sleep apnea, unspecified: Secondary | ICD-10-CM | POA: Diagnosis not present

## 2015-09-19 DIAGNOSIS — K429 Umbilical hernia without obstruction or gangrene: Secondary | ICD-10-CM | POA: Diagnosis not present

## 2015-09-19 DIAGNOSIS — I10 Essential (primary) hypertension: Secondary | ICD-10-CM | POA: Insufficient documentation

## 2015-09-19 DIAGNOSIS — E669 Obesity, unspecified: Secondary | ICD-10-CM | POA: Insufficient documentation

## 2015-09-19 DIAGNOSIS — I2510 Atherosclerotic heart disease of native coronary artery without angina pectoris: Secondary | ICD-10-CM | POA: Insufficient documentation

## 2015-09-19 DIAGNOSIS — E119 Type 2 diabetes mellitus without complications: Secondary | ICD-10-CM | POA: Diagnosis not present

## 2015-09-19 NOTE — Progress Notes (Signed)
[name] [name]  Telephone:(336) [phone] Fax:(336) [phone]  ID: [name] [name] OB: 03-07-1961  MR#: [id number]  WLN#:[id number]  Patient Care Team: Steele Sizer, MD as PCP - General  CHIEF COMPLAINT:  Chief Complaint  Patient presents with  . New Evaluation    polycythemia    INTERVAL HISTORY: Patient last evaluated in February 2012 for polycythemia. He is referred back for concern of increasing hemoglobin. He currently feels well and is asymptomatic. He has no neurologic complaints. He denies any fevers. He has a good appetite and denies weight loss. He has no chest pain or shortness of breath. He denies any nausea, vomiting, constipation, or diarrhea. He has no urinary complaints. Patient feels at his baseline and offers no specific complaints today.  REVIEW OF SYSTEMS:   Review of Systems  Constitutional: Negative.   Respiratory: Negative.   Cardiovascular: Negative.   Musculoskeletal: Negative.   Neurological: Negative.     As per HPI. Otherwise, a complete review of systems is negatve.  PAST MEDICAL HISTORY: Past Medical History  Diagnosis Date  . Hernia 1991    02/10/2012-RIH repair  . Personal history of tobacco use, presenting hazards to health 2012  . Hypertension 2008  . Inguinal hernia without mention of obstruction or gangrene, recurrent unilateral or unspecified 2012  . Obesity, unspecified 2012  . Umbilical hernia without mention of obstruction or gangrene 2012  . Diabetes mellitus without complication   . Hyperlipidemia   . Degeneration of intervertebral disc of cervical region   . Sleep apnea   . CAD (coronary artery disease)     a. cardiac cath 05/30/2015: LM nl, mLAD 50%, LCx minor irregs, RCA minor irregs, EF 55-65%, no WMA, no MR/AS, nl LVEDP, recommend aggressive med Rx  . Allergy     PAST SURGICAL HISTORY: Past Surgical History  Procedure Laterality Date  . Back surgery  12/2008  . Colonoscopy  2014    Dr. [name]  . Hernia  repair  1991  . Inguinal hernia repair Right 2012  . Inguinal hernia repair Right 2014  . Cardiac catheterization N/A 05/30/2015    Procedure: Left Heart Cath;  Surgeon: Wellington Hampshire, MD;  Location: [facility];  Service: Cardiovascular;  Laterality: N/A;    FAMILY HISTORY Family History  Problem Relation Age of Onset  . Heart Problems Mother   . Heart Problems Father 44    myocardial infarction  . Mental illness Brother        ADVANCED DIRECTIVES:    HEALTH MAINTENANCE: Social History  Substance Use Topics  . Smoking status: Former Smoker -- 2 years    Types: Cigars    Start date: 12/28/2002    Quit date: 06/04/2005  . Smokeless tobacco: Never Used  . Alcohol Use: 1.2 oz/week    2 Cans of beer per week     Colonoscopy:  PAP:  Bone density:  Lipid panel:  Allergies  Allergen Reactions  . Gabapentin     Groggy-Mood Changes  . Latex   . Penicillins Hives and Rash    Current Outpatient Prescriptions  Medication Sig Dispense Refill  . amLODipine-valsartan (EXFORGE) 5-160 MG per tablet Take 1 tablet by mouth daily.    . Armodafinil (NUVIGIL) 150 MG tablet Take 1 tablet (150 mg total) by mouth daily. 30 tablet 2  . aspirin 81 MG tablet Take 81 mg by mouth daily.    Marland Kitchen atorvastatin (LIPITOR) 80 MG tablet take 1 tablet by mouth once daily 30  tablet 5  . carvedilol (COREG) 12.5 MG tablet Take 12.5 mg by mouth 2 (two) times daily.    Marland Kitchen FLECTOR 1.3 % PTCH Apply 1 patch topically once a week.  0  . fluticasone (FLONASE) 50 MCG/ACT nasal spray Place 2 sprays into the nose as needed.    . loratadine (CLARITIN) 10 MG tablet Take 10 mg by mouth as needed.     . metFORMIN (GLUCOPHAGE) 850 MG tablet take 1 tablet by mouth once daily 30 tablet 2  . nitroGLYCERIN (NITROSTAT) 0.4 MG SL tablet Place 1 tablet (0.4 mg total) under the tongue every 5 (five) minutes as needed for chest pain. 15 tablet 1  . omeprazole (PRILOSEC) 20 MG capsule take 1 capsule by mouth every  morning 30 capsule 2  . polyethylene glycol powder (GLYCOLAX/MIRALAX) powder Take 8 g by mouth as needed. For constipation  0  . ranolazine (RANEXA) 1000 MG SR tablet Take 1 tablet (1,000 mg total) by mouth 2 (two) times daily. 60 tablet 5  . sildenafil (REVATIO) 20 MG tablet take 1 to 2 tablets by mouth once daily if needed 60 tablet 2   No current facility-administered medications for this visit.    OBJECTIVE: Filed Vitals:   09/19/15 1534  BP: 120/66  Pulse: 61  Temp: 96.5 F (35.8 C)  Resp: 20     Body mass index is 36.51 kg/(m^2).    ECOG FS:0 - Asymptomatic  General: Well-developed, well-nourished, no acute distress. Eyes: Pink conjunctiva, anicteric sclera. HEENT: Normocephalic, moist mucous membranes, clear oropharnyx. Lungs: Clear to auscultation bilaterally. Heart: Regular rate and rhythm. No rubs, murmurs, or gallops. Abdomen: Soft, nontender, nondistended. No organomegaly noted, normoactive bowel sounds. Musculoskeletal: No edema, cyanosis, or clubbing. Neuro: Alert, answering all questions appropriately. Cranial nerves grossly intact. Skin: No rashes or petechiae noted. Psych: Normal affect. Lymphatics: No cervical, calvicular, axillary or inguinal LAD.   LAB RESULTS:  Lab Results  Component Value Date   NA 140 09/05/2015   K 4.1 09/05/2015   CL 98 09/05/2015   CO2 23 09/05/2015   GLUCOSE 108* 09/05/2015   BUN 15 09/05/2015   CREATININE 1.09 09/05/2015   CALCIUM 9.8 09/05/2015   PROT 8.2 09/05/2015   AST 33 09/05/2015   ALT 27 09/05/2015   ALKPHOS 84 09/05/2015   BILITOT 0.7 09/05/2015   GFRNONAA 77 09/05/2015   GFRAA 89 09/05/2015    Lab Results  Component Value Date   WBC 8.3 09/05/2015   NEUTROABS 4.4 09/05/2015   HCT 46.1 09/05/2015     STUDIES: No results found.  ASSESSMENT: Secondary polycythemia.  PLAN:    1. Polycythemia: Patient's previous records and laboratory work were reviewed extensively.  Likely secondary to patient's  obstructive sleep apnea. Previously his entire workup including bone marrow biopsy in August 2008 as well as JAK-2 mutation was either negative or within normal limits. Patient's continues to be below his goal hematocrit of 48%. No intervention is needed at this time. Patient does not require phlebotomy today. Return to clinic in 6 months with repeat laboratory work and further evaluation. If his hematocrit continues to be within normal limits, he likely can be discharged from clinic.  Patient expressed understanding and was in agreement with this plan. He also understands that He can call clinic at any time with any questions, concerns, or complaints.   Lloyd Huger, MD   09/19/2015 3:35 PM

## 2015-10-09 ENCOUNTER — Other Ambulatory Visit: Payer: Self-pay | Admitting: Family Medicine

## 2015-12-05 ENCOUNTER — Ambulatory Visit (INDEPENDENT_AMBULATORY_CARE_PROVIDER_SITE_OTHER): Payer: Medicaid Other | Admitting: Family Medicine

## 2015-12-05 ENCOUNTER — Encounter: Payer: Self-pay | Admitting: Family Medicine

## 2015-12-05 VITALS — BP 128/86 | HR 61 | Temp 98.0°F | Resp 16 | Ht 71.0 in | Wt 260.9 lb

## 2015-12-05 DIAGNOSIS — E1149 Type 2 diabetes mellitus with other diabetic neurological complication: Secondary | ICD-10-CM

## 2015-12-05 DIAGNOSIS — G8929 Other chronic pain: Secondary | ICD-10-CM | POA: Diagnosis not present

## 2015-12-05 DIAGNOSIS — G629 Polyneuropathy, unspecified: Secondary | ICD-10-CM

## 2015-12-05 DIAGNOSIS — K219 Gastro-esophageal reflux disease without esophagitis: Secondary | ICD-10-CM

## 2015-12-05 DIAGNOSIS — G4731 Primary central sleep apnea: Secondary | ICD-10-CM | POA: Diagnosis not present

## 2015-12-05 DIAGNOSIS — I10 Essential (primary) hypertension: Secondary | ICD-10-CM

## 2015-12-05 DIAGNOSIS — M545 Low back pain, unspecified: Secondary | ICD-10-CM

## 2015-12-05 DIAGNOSIS — I209 Angina pectoris, unspecified: Secondary | ICD-10-CM | POA: Diagnosis not present

## 2015-12-05 DIAGNOSIS — E1140 Type 2 diabetes mellitus with diabetic neuropathy, unspecified: Secondary | ICD-10-CM | POA: Insufficient documentation

## 2015-12-05 DIAGNOSIS — E785 Hyperlipidemia, unspecified: Secondary | ICD-10-CM

## 2015-12-05 LAB — POCT GLYCOSYLATED HEMOGLOBIN (HGB A1C): Hemoglobin A1C: 5.9

## 2015-12-05 MED ORDER — FLECTOR 1.3 % TD PTCH: 1.0000 | MEDICATED_PATCH | Freq: Two times a day (BID) | TRANSDERMAL | Status: DC

## 2015-12-05 MED ORDER — ARMODAFINIL 150 MG PO TABS: 150.0000 mg | ORAL_TABLET | Freq: Every day | ORAL | Status: DC

## 2015-12-05 MED ORDER — ATORVASTATIN CALCIUM 80 MG PO TABS: 80.0000 mg | ORAL_TABLET | Freq: Every day | ORAL | Status: DC

## 2015-12-05 MED ORDER — CARVEDILOL 12.5 MG PO TABS: 12.5000 mg | ORAL_TABLET | Freq: Two times a day (BID) | ORAL | Status: DC

## 2015-12-05 MED ORDER — METFORMIN HCL 850 MG PO TABS: 850.0000 mg | ORAL_TABLET | Freq: Two times a day (BID) | ORAL | Status: DC

## 2015-12-05 MED ORDER — OMEPRAZOLE 20 MG PO CPDR: 20.0000 mg | DELAYED_RELEASE_CAPSULE | Freq: Every morning | ORAL | Status: DC

## 2015-12-05 NOTE — Progress Notes (Signed)
Name: [name] [name]   MRN: [id number]    DOB: [birth year]   Date:12/05/2015       Progress Note  Subjective  Chief Complaint  Chief Complaint  Patient presents with  . Medication Refill    3 month F/U, patient wants refill of sildenafil, omeprazole, carvedilol, armodafinil and flector patch.  . 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 Taking Metformin and hgbA1C is well controlled. Diagnosed in 07/2014. He is following a diabetic diet. Denies polyphagia, polydipsia and polyuria  HTN: doing well, he is compliant with medication, chest pain with moderate activity , no SOB  Hyperlipidemia: taking Atorvastatin, denies myalgia.  GERD: under control with medication   CAD: he is no longer having chest pain, unless moderate activity - sees cardiologist - Dr. Fletcher Anon - on Ranexa and is doing well, on statin, aspirin and beta-blocker. Dr. Fletcher Anon knows that he is taking Sildenafil.   Chronic neck pain with radiculitis: having pain daily, using patches, seeing neurosurgeon in a couple weeks at [facility]  Chronic Low back pain: using Flector patchers daily and is controlling pain, also uses ice pack. He stopped going to pain clinic - Dr. Levora Angel.   OSA: he wears CPAP every night, but still feels tired during the day if he does not take Nuvigil    Patient Active Problem List   Diagnosis Date Noted  . Diabetic neuropathy associated with type 2 diabetes mellitus ([place]) 12/05/2015  . Patellar subluxation 06/05/2015  . Allergic rhinitis, seasonal 06/05/2015  . Chronic constipation 06/05/2015  . Chronic LBP 06/05/2015  . Morbid obesity due to excess calories ([place]) 06/05/2015  . Vitamin D deficiency 06/05/2015  . Central sleep apnea 06/05/2015  . 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 Nerve root pain 06/05/2015  . Acquired polycythemia 06/05/2015  . Anterior knee pain 06/05/2015  .  Dysmetabolic syndrome 99991111  . Failure of erection 06/05/2015  . Gastro-esophageal reflux disease without esophagitis 06/05/2015  . Neuropathy ([place]) 06/05/2015  . CAD (coronary artery disease)   . Angina, class III ([place]) 05/23/2015  . Essential hypertension 05/23/2015  . Hyperlipidemia 05/23/2015  . Inguinal hernia without mention of obstruction or gangrene, recurrent unilateral or unspecified 03/24/2013    Past Surgical History  Procedure Laterality Date  . Back surgery  12/2008  . Colonoscopy  2014    Dr. [name]  . Hernia repair  1991  . Inguinal hernia repair Right 2012  . Inguinal hernia repair Right 2014  . Cardiac catheterization N/A 05/30/2015    Procedure: Left Heart Cath;  Surgeon: Wellington Hampshire, MD;  Location: [facility];  Service: Cardiovascular;  Laterality: N/A;    Family History  Problem Relation Age of Onset  . Heart Problems Mother   . Heart Problems Father 58    myocardial infarction  . Mental illness Brother     Social History   Social History  . Marital Status: Single    Spouse Name: N/A  . Number of Children: N/A  . Years of Education: N/A   Occupational History  . Not on file.   Social History Main Topics  . Smoking status: Former Smoker -- 2 years    Types: Cigars    Start date: 12/28/2002    Quit date: 06/04/2005  . Smokeless tobacco: Never Used  . Alcohol Use:  1.2 oz/week    2 Cans of beer per week  . Drug Use: No  . Sexual Activity:    Partners: Female   Other Topics Concern  . Not on file   Social History Narrative     Current outpatient prescriptions:  .  Armodafinil (NUVIGIL) 150 MG tablet, Take 1 tablet (150 mg total) by mouth daily., Disp: 30 tablet, Rfl: 2 .  aspirin 81 MG tablet, Take 81 mg by mouth daily., Disp: , Rfl:  .  atorvastatin (LIPITOR) 80 MG tablet, Take 1 tablet (80 mg total) by mouth daily., Disp: 30 tablet, Rfl: 5 .  carvedilol (COREG) 12.5 MG tablet, Take 1 tablet (12.5 mg total) by mouth 2  (two) times daily., Disp: 60 tablet, Rfl: 5 .  EXFORGE HCT 5-160-12.5 MG TABS, tablet by mouth once daily, Disp: 30 tablet, Rfl: 5 .  FLECTOR 1.3 % PTCH, Place 1 patch onto the skin 2 (two) times daily., Disp: 60 patch, Rfl: 5 .  fluticasone (FLONASE) 50 MCG/ACT nasal spray, Place 2 sprays into the nose as needed., Disp: , Rfl:  .  loratadine (CLARITIN) 10 MG tablet, Take 10 mg by mouth as needed. , Disp: , Rfl:  .  metFORMIN (GLUCOPHAGE) 850 MG tablet, Take 1 tablet (850 mg total) by mouth 2 (two) times daily with a meal., Disp: 60 tablet, Rfl: 5 .  nitroGLYCERIN (NITROSTAT) 0.4 MG SL tablet, Place 1 tablet (0.4 mg total) under the tongue every 5 (five) minutes as needed for chest pain., Disp: 15 tablet, Rfl: 1 .  omeprazole (PRILOSEC) 20 MG capsule, Take 1 capsule (20 mg total) by mouth every morning., Disp: 30 capsule, Rfl: 5 .  polyethylene glycol powder (GLYCOLAX/MIRALAX) powder, Take 8 g by mouth as needed. For constipation, Disp: , Rfl: 0 .  ranolazine (RANEXA) 1000 MG SR tablet, Take 1 tablet (1,000 mg total) by mouth 2 (two) times daily., Disp: 60 tablet, Rfl: 5 .  sildenafil (REVATIO) 20 MG tablet, take 1-2 tablets by mouth once daily if needed, Disp: 60 tablet, Rfl: 2  Allergies  Allergen Reactions  . Gabapentin     Groggy-Mood Changes  . Latex   . Penicillins Hives and Rash     ROS  Constitutional: Negative for fever or weight change.  Respiratory: Negative for cough and shortness of breath.   Cardiovascular: Intermittent chest pain or palpitations.  Gastrointestinal: Negative for abdominal pain, no bowel changes.  Musculoskeletal: Negative for gait problem or joint swelling.  Skin: Negative for rash.  Neurological: Negative for dizziness or headache.  No other specific complaints in a complete review of systems (except as listed in HPI above).  Objective  Filed Vitals:   12/05/15 1014  BP: 128/86  Pulse: 61  Temp: 98 F (36.7 C)  TempSrc: Oral  Resp: 16  Height:  5\' 11"  (1.803 m)  Weight: 260 lb 14.4 oz (118.343 kg)  SpO2: 91%    Body mass index is 36.4 kg/(m^2).  Physical Exam  Constitutional: Patient appears well-developed and well-nourished. Obese  No distress.  HEENT: head atraumatic, normocephalic, pupils equal and reactive to light, neck supple, throat within normal limits Cardiovascular: Normal rate, regular rhythm and normal heart sounds.  No murmur heard. No BLE edema. Pulmonary/Chest: Effort normal and breath sounds normal. No respiratory distress. Abdominal: Soft.  There is no tenderness. Psychiatric: Patient has a normal mood and affect. behavior is normal. Judgment and thought content normal. Muscular Skeletal: pain during palpation of lumbar spine, well healed scar  from surgery. Mild neck pain during palpation   Recent Results (from the past 2160 hour(s))  POCT HgB A1C     Status: Normal   Collection Time: 12/05/15 10:34 AM  Result Value Ref Range   Hemoglobin A1C 5.9     PHQ2/9: Depression screen [facility] 2/9 12/05/2015 09/05/2015 06/05/2015  Decreased Interest 0 0 0  Down, Depressed, Hopeless 0 0 0  PHQ - 2 Score 0 0 0     Fall Risk: Fall Risk  12/05/2015 09/05/2015 06/05/2015  Falls in the past year? Yes No No  Number falls in past yr: 2 or more - -  Injury with Fall? Yes - -    Functional Status Survey: Is the patient deaf or have difficulty hearing?: No Does the patient have difficulty seeing, even when wearing glasses/contacts?: No Does the patient have difficulty concentrating, remembering, or making decisions?: Yes Does the patient have difficulty walking or climbing stairs?: No Does the patient have difficulty dressing or bathing?: No Does the patient have difficulty doing errands alone such as visiting a doctor's office or shopping?: No    Assessment & Plan  1. Other diabetic neurological complication associated with type 2 diabetes mellitus ([place])  - POCT HgB A1C - excellent, he can go down to once daily  -  metFORMIN (GLUCOPHAGE) 850 MG tablet; Take 1 tablet (850 mg total) by mouth 2 (two) times daily with a meal.  Dispense: 60 tablet; Refill: 5  2. Hyperlipidemia  - atorvastatin (LIPITOR) 80 MG tablet; Take 1 tablet (80 mg total) by mouth daily.  Dispense: 30 tablet; Refill: 5  3. Morbid obesity due to excess calories [facility])  Discussed with the patient the risk posed by an increased BMI. Discussed importance of portion control, calorie counting and at least 150 minutes of physical activity weekly. Avoid sweet beverages and drink more water. Eat at least 6 servings of fruit and vegetables daily   4. Angina, class III (HCC)  stable  5. Essential hypertension  - carvedilol (COREG) 12.5 MG tablet; Take 1 tablet (12.5 mg total) by mouth 2 (two) times daily.  Dispense: 60 tablet; Refill: 5  6. Neuropathy ([place])  Only when sitting too long, has symptoms on his legs, he does not want medication, using TEN's unit prn  7. Central sleep apnea  - Armodafinil (NUVIGIL) 150 MG tablet; Take 1 tablet (150 mg total) by mouth daily.  Dispense: 30 tablet; Refill: 2  8. Gastro-esophageal reflux disease without esophagitis  - omeprazole (PRILOSEC) 20 MG capsule; Take 1 capsule (20 mg total) by mouth every morning.  Dispense: 30 capsule; Refill: 5  9. Chronic LBP  - FLECTOR 1.3 % PTCH; Place 1 patch onto the skin 2 (two) times daily.  Dispense: 60 patch; Refill: 5

## 2016-01-07 ENCOUNTER — Other Ambulatory Visit: Payer: Self-pay | Admitting: Family Medicine

## 2016-01-08 NOTE — Telephone Encounter (Signed)
Patient requesting refill. 

## 2016-02-07 ENCOUNTER — Ambulatory Visit: Payer: Medicaid Other | Admitting: Cardiovascular Disease

## 2016-03-05 ENCOUNTER — Encounter: Payer: Self-pay | Admitting: Family Medicine

## 2016-03-05 ENCOUNTER — Ambulatory Visit (INDEPENDENT_AMBULATORY_CARE_PROVIDER_SITE_OTHER): Payer: Medicaid Other | Admitting: Family Medicine

## 2016-03-05 VITALS — BP 136/70 | HR 78 | Temp 97.9°F | Resp 16 | Ht 71.0 in | Wt 258.0 lb

## 2016-03-05 DIAGNOSIS — G8929 Other chronic pain: Secondary | ICD-10-CM

## 2016-03-05 DIAGNOSIS — G4731 Primary central sleep apnea: Secondary | ICD-10-CM

## 2016-03-05 DIAGNOSIS — E785 Hyperlipidemia, unspecified: Secondary | ICD-10-CM

## 2016-03-05 DIAGNOSIS — H9313 Tinnitus, bilateral: Secondary | ICD-10-CM

## 2016-03-05 DIAGNOSIS — E1140 Type 2 diabetes mellitus with diabetic neuropathy, unspecified: Secondary | ICD-10-CM | POA: Diagnosis not present

## 2016-03-05 DIAGNOSIS — M545 Low back pain: Secondary | ICD-10-CM | POA: Diagnosis not present

## 2016-03-05 DIAGNOSIS — M5412 Radiculopathy, cervical region: Secondary | ICD-10-CM | POA: Diagnosis not present

## 2016-03-05 DIAGNOSIS — I25118 Atherosclerotic heart disease of native coronary artery with other forms of angina pectoris: Secondary | ICD-10-CM | POA: Diagnosis not present

## 2016-03-05 DIAGNOSIS — I10 Essential (primary) hypertension: Secondary | ICD-10-CM | POA: Diagnosis not present

## 2016-03-05 LAB — POCT GLYCOSYLATED HEMOGLOBIN (HGB A1C)

## 2016-03-05 NOTE — Progress Notes (Signed)
Name: [name] [name]   MRN: [id number]    DOB: [birth year]   Date:03/05/2016       Progress Note  Subjective  Chief Complaint  Chief Complaint  Patient presents with  . Medication Refill    3 month f/u  . Hypertension  . Tinnitus    Onset x 1 month, constant ringing  . Hyperlipidemia  . 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 Taking Metformin and hgbA1C is well controlled. Diagnosed in 07/2014. He is following a diabetic diet. Denies polyphagia, polydipsia and polyuria.   HTN: doing well, he is compliant with medication, he has been walking around the track. BP is at goal, denies side effects of medication  Hyperlipidemia: taking Atorvastatin, denies myalgia.  GERD: under control with medication No heartburn or regurgitation  CAD: he has chest pain seldom, usually just with strenuos activity, unless moderate activity - sees cardiologist - Dr. Fletcher Anon - on Ranexa on statin, aspirin and beta-blocker. Dr. Fletcher Anon knows that he is taking Sildenafil.   Chronic neck pain with radiculitis: having pain daily, using patches, seeing neurosurgeon at [facility], he was going to the pain clinic, he does no want to get steroid injections at this time. He states he has paresthesia daily. He would like a second opinion  Chronic Low back pain: using Flector patchers daily and is controlling pain, also uses ice pack. He stopped going to pain clinic - Dr. Levora Angel, because of the distance.   OSA: he wears CPAP every night, but still feels tired during the day if he does not take Nuvigil    Patient Active Problem List   Diagnosis Date Noted  . Radiculitis of left cervical region 03/05/2016  . Diabetic neuropathy associated with type 2 diabetes mellitus ([place]) 12/05/2015  . Patellar subluxation 06/05/2015  . Allergic rhinitis, seasonal  06/05/2015  . Chronic constipation 06/05/2015  . Chronic LBP 06/05/2015  . Morbid obesity due to excess calories ([place]) 06/05/2015  . Vitamin D deficiency 06/05/2015  . Central sleep apnea 06/05/2015  . 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 Nerve root pain 06/05/2015  . Acquired polycythemia 06/05/2015  . Anterior knee pain 06/05/2015  . Dysmetabolic syndrome 99991111  . Failure of erection 06/05/2015  . Gastro-esophageal reflux disease without esophagitis 06/05/2015  . Neuropathy ([place]) 06/05/2015  . CAD (coronary artery disease)   . Angina, class III ([place]) 05/23/2015  . Essential hypertension 05/23/2015  . Hyperlipidemia 05/23/2015  . Inguinal hernia without mention of obstruction or gangrene, recurrent unilateral or unspecified 03/24/2013    Past Surgical History  Procedure Laterality Date  . Back surgery  12/2008  . Colonoscopy  2014    Dr. [name]  . Hernia repair  1991  . Inguinal hernia repair Right 2012  . Inguinal hernia repair Right 2014  . Cardiac catheterization N/A 05/30/2015    Procedure: Left Heart Cath;  Surgeon: Wellington Hampshire, MD;  Location: [facility];  Service: Cardiovascular;  Laterality: N/A;    Family History  Problem Relation Age of Onset  . Heart Problems Mother   . Heart Problems Father 4    myocardial infarction  . Mental illness Brother     Social History   Social History  . Marital Status: Single    Spouse Name:  N/A  . Number of Children: N/A  . Years of Education: N/A   Occupational History  . Not on file.   Social History Main Topics  . Smoking status: Former Smoker -- 2 years    Types: Cigars    Start date: 12/28/2002    Quit date: 06/04/2005  . Smokeless tobacco: Never Used  . Alcohol Use: 0.6 oz/week    1 Glasses of wine per week  . Drug Use: No  . Sexual Activity:    Partners: Female   Other Topics Concern  . Not on file   Social History Narrative     Current outpatient prescriptions:  .  Armodafinil (NUVIGIL)  150 MG tablet, Take 1 tablet (150 mg total) by mouth daily., Disp: 30 tablet, Rfl: 2 .  aspirin 81 MG tablet, Take 81 mg by mouth daily., Disp: , Rfl:  .  atorvastatin (LIPITOR) 80 MG tablet, Take 1 tablet (80 mg total) by mouth daily., Disp: 30 tablet, Rfl: 5 .  carvedilol (COREG) 12.5 MG tablet, Take 1 tablet (12.5 mg total) by mouth 2 (two) times daily., Disp: 60 tablet, Rfl: 5 .  EXFORGE HCT 5-160-12.5 MG TABS, tablet by mouth once daily, Disp: 30 tablet, Rfl: 5 .  FLECTOR 1.3 % PTCH, Place 1 patch onto the skin 2 (two) times daily., Disp: 60 patch, Rfl: 5 .  fluticasone (FLONASE) 50 MCG/ACT nasal spray, Place 2 sprays into the nose as needed., Disp: , Rfl:  .  loratadine (CLARITIN) 10 MG tablet, Take 10 mg by mouth as needed. , Disp: , Rfl:  .  metFORMIN (GLUCOPHAGE) 850 MG tablet, Take 1 tablet (850 mg total) by mouth 2 (two) times daily with a meal., Disp: 60 tablet, Rfl: 5 .  nitroGLYCERIN (NITROSTAT) 0.4 MG SL tablet, Place 1 tablet (0.4 mg total) under the tongue every 5 (five) minutes as needed for chest pain., Disp: 15 tablet, Rfl: 1 .  omeprazole (PRILOSEC) 20 MG capsule, Take 1 capsule (20 mg total) by mouth every morning., Disp: 30 capsule, Rfl: 5 .  polyethylene glycol powder (GLYCOLAX/MIRALAX) powder, Take 8 g by mouth as needed. For constipation, Disp: , Rfl: 0 .  ranolazine (RANEXA) 1000 MG SR tablet, Take 1 tablet (1,000 mg total) by mouth 2 (two) times daily., Disp: 60 tablet, Rfl: 5 .  sildenafil (REVATIO) 20 MG tablet, take 1-2 tablets by mouth once daily if needed, Disp: 60 tablet, Rfl: 2  Allergies  Allergen Reactions  . Gabapentin     Groggy-Mood Changes  . Latex   . Penicillins Hives and Rash     ROS  Constitutional: Negative for fever or significant weight change.  Respiratory: Negative for cough and shortness of breath.   Cardiovascular: Negative for chest pain or palpitations.  Gastrointestinal: Negative for abdominal pain, no bowel changes.   Musculoskeletal: Negative for gait problem or joint swelling.  Skin: Negative for rash.  Neurological: Negative for dizziness or headache.  No other specific complaints in a complete review of systems (except as listed in HPI above).  Objective  Filed Vitals:   03/05/16 1317  BP: 136/70  Pulse: 78  Temp: 97.9 F (36.6 C)  TempSrc: Oral  Resp: 16  Height: 5\' 11"  (1.803 m)  Weight: 258 lb (117.028 kg)  SpO2: 94%    Body mass index is 36 kg/(m^2).  Physical Exam  Constitutional: Patient appears well-developed and well-nourished. Obese No distress.  HEENT: head atraumatic, normocephalic, pupils equal and reactive to light, neck supple, throat  within normal limits Cardiovascular: Normal rate, regular rhythm and normal heart sounds. No murmur heard. No BLE edema. Pulmonary/Chest: Effort normal and breath sounds normal. No respiratory distress. Abdominal: Soft. There is no tenderness. Psychiatric: Patient has a normal mood and affect. behavior is normal. Judgment and thought content normal. Muscular Skeletal: pain during palpation of lumbar spine, well healed scar from surgery. Mild neck pain during palpation   PHQ2/9: Depression screen [facility] 2/9 03/05/2016 12/05/2015 09/05/2015 06/05/2015  Decreased Interest 0 0 0 0  Down, Depressed, Hopeless 0 0 0 0  PHQ - 2 Score 0 0 0 0    Fall Risk: Fall Risk  03/05/2016 12/05/2015 09/05/2015 06/05/2015  Falls in the past year? Yes Yes No No  Number falls in past yr: 2 or more 2 or more - -  Injury with Fall? Yes Yes - -    Functional Status Survey: Is the patient deaf or have difficulty hearing?: Yes (ringing in ears) Does the patient have difficulty seeing, even when wearing glasses/contacts?: No Does the patient have difficulty concentrating, remembering, or making decisions?: Yes (patient states some trouble remembering things sometimes) Does the patient have difficulty walking or climbing stairs?: No Does the patient have difficulty  dressing or bathing?: No Does the patient have difficulty doing errands alone such as visiting a doctor's office or shopping?: No    Assessment & Plan  1. Essential hypertension  Well controlled with medication   2. Hyperlipidemia  Doing well, recheck labs next visit  3. Central sleep apnea  Continue CPAP use  4. Type 2 diabetes mellitus with diabetic neuropathy, without long-term current use of insulin (HCC)  Continue life style modification, Metformin once daily ,  losing weight, no longer getting steroid injections HgbA1C not able to be read secondary to polycethemia   5. 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 Fletcher Anon, doing better, no recent episodes of chest pain, on medical management  6. Tinnitus of both ears  - Ambulatory referral to ENT  7. Chronic LBP  - Ambulatory referral to Neurosurgery  8. Radiculitis of left cervical region  - Ambulatory referral to Neurosurgery

## 2016-03-17 ENCOUNTER — Ambulatory Visit (INDEPENDENT_AMBULATORY_CARE_PROVIDER_SITE_OTHER): Payer: Medicaid Other | Admitting: Cardiovascular Disease

## 2016-03-17 ENCOUNTER — Encounter: Payer: Self-pay | Admitting: Cardiovascular Disease

## 2016-03-17 ENCOUNTER — Encounter (INDEPENDENT_AMBULATORY_CARE_PROVIDER_SITE_OTHER): Payer: Self-pay

## 2016-03-17 VITALS — BP 120/90 | HR 63 | Ht 71.0 in | Wt 262.5 lb

## 2016-03-17 DIAGNOSIS — I2510 Atherosclerotic heart disease of native coronary artery without angina pectoris: Secondary | ICD-10-CM | POA: Diagnosis not present

## 2016-03-17 DIAGNOSIS — I209 Angina pectoris, unspecified: Secondary | ICD-10-CM

## 2016-03-17 DIAGNOSIS — I2583 Coronary atherosclerosis due to lipid rich plaque: Principal | ICD-10-CM

## 2016-03-17 NOTE — Patient Instructions (Addendum)
Medication Instructions:  Your physician recommends that you continue on your current medications as directed. Please refer to the Current Medication list given to you today.   Labwork: none  Testing/Procedures: Your physician has requested that you have a lexiscan myoview. For further information please visit [email or web address]. Please follow instruction sheet, as given.  [place]  Your caregiver has ordered a Stress Test with nuclear imaging. The purpose of this test is to evaluate the blood supply to your heart muscle. This procedure is referred to as a "Non-Invasive Stress Test." This is because other than having an IV started in your vein, nothing is inserted or "invades" your body. Cardiac stress tests are done to find areas of poor blood flow to the heart by determining the extent of coronary artery disease (CAD). Some patients exercise on a treadmill, which naturally increases the blood flow to your heart, while others who are  unable to walk on a treadmill due to physical limitations have a pharmacologic/chemical stress agent called Lexiscan . This medicine will mimic walking on a treadmill by temporarily increasing your coronary blood flow.   Please note: these test may take anywhere between 2-4 hours to complete  PLEASE REPORT TO [place] AT THE FIRST DESK WILL DIRECT YOU WHERE TO GO  Date of Procedure: Friday, March 24 Arrival Time for Procedure: 8:15am  Instructions regarding medication:   ___xx_ : Hold diabetes medication morning of procedure  __xx__:  Hold coreg night before procedure and morning of procedure   PLEASE NOTIFY THE OFFICE AT LEAST 24 HOURS IN ADVANCE IF YOU ARE UNABLE TO KEEP YOUR APPOINTMENT.  [phone] AND  PLEASE NOTIFY NUCLEAR MEDICINE AT [facility] AT LEAST 24 HOURS IN ADVANCE IF YOU ARE UNABLE TO KEEP YOUR APPOINTMENT. [phone]  How to prepare for your Myoview test:   Do not eat or drink after  midnight  No caffeine for 24 hours prior to test  No smoking 24 hours prior to test.  Your medication may be taken with water.  If your doctor stopped a medication because of this test, do not take that medication.  Ladies, please do not wear dresses.  Skirts or pants are appropriate. Please wear a short sleeve shirt.  No perfume, cologne or lotion.  Wear comfortable walking shoes. No heels!            Follow-Up: Your physician recommends that you schedule a follow-up appointment in: three months with Dr. Fletcher Anon.    Any Other Special Instructions Will Be Listed Below (If Applicable).     If you need a refill on your cardiac medications before your next appointment, please call your pharmacy.  Cardiac Nuclear Scanning A cardiac nuclear scan is used to check your heart for problems, such as the following:  A portion of the heart is not getting enough blood.  Part of the heart muscle has died, which happens with a heart attack.  The heart wall is not working normally.  In this test, a radioactive dye (tracer) is injected into your bloodstream. After the tracer has traveled to your heart, a scanning device is used to measure how much of the tracer is absorbed by or distributed to various areas of your heart. LET [facility] CARE PROVIDER KNOW ABOUT:  Any allergies you have.  All medicines you are taking, including vitamins, herbs, eye drops, creams, and over-the-counter medicines.  Previous problems you or members of your family have had with the use of anesthetics.  Any blood disorders you have.  Previous surgeries you have had.  Medical conditions you have.  RISKS AND COMPLICATIONS Generally, this is a safe procedure. However, as with any procedure, problems can occur. Possible problems include:   Serious chest pain.  Rapid heartbeat.  Sensation of warmth in your chest. This usually passes quickly. BEFORE THE PROCEDURE Ask your health care provider about  changing or stopping your regular medicines. PROCEDURE This procedure is usually done at a hospital and takes 2-4 hours.  An IV tube is inserted into one of your veins.  Your health care provider will inject a small amount of radioactive tracer through the tube.  You will then wait for 20-40 minutes while the tracer travels through your bloodstream.  You will lie down on an exam table so images of your heart can be taken. Images will be taken for about 15-20 minutes.  You will exercise on a treadmill or stationary bike. While you exercise, your heart activity will be monitored with an electrocardiogram (ECG), and your blood pressure will be checked.  If you are unable to exercise, you may be given a medicine to make your heart beat faster.  When blood flow to your heart has peaked, tracer will again be injected through the IV tube.  After 20-40 minutes, you will get back on the exam table and have more images taken of your heart.  When the procedure is over, your IV tube will be removed. AFTER THE PROCEDURE  You will likely be able to leave shortly after the test. Unless your health care provider tells you otherwise, you may return to your normal schedule, including diet, activities, and medicines.  Make sure you find out how and when you will get your test results.   This information is not intended to replace advice given to you by your health care provider. Make sure you discuss any questions you have with your health care provider.   Document Released: 01/08/2005 Document Revised: 12/19/2013 Document Reviewed: 11/22/2013 Elsevier Interactive Patient Education [employer].

## 2016-03-17 NOTE — Progress Notes (Signed)
Cardiology Office Note   Date:  03/17/2016   ID:  [name], [name] 03-06-1961, MRN [id number]  PCP:  [name], MD  Cardiologist:   [name], MD   Chief Complaint  Patient presents with  . other    6 month f/u c/o chest discomfort used nitro left arm numbness with working out, ear ringing, sob and fatigue. Meds reviewed verbally with pt.      History of Present Illness: [name] [name] is a 55 y.o. 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 He has known history of type 2 diabetes, hypertension, hyperlipidemia and obesity.He is a previous smoker. He was seen in 2016 for substernal chest heaviness with activities. He has family history of coronary artery disease. His father died of myocardial infarction at the age of 26. Cardiac catheterization in June 2016 showed moderate mid LAD stenosis (50%) with an FFR ratio of 0.83 . LV systolic function with normal with normal left ventricular end-diastolic pressure. He was thus treated medically and initially improved significantly with Ranexa. However, he recently became ill with an upper respiratory tract infection. He was trying to walk up to health and all of a sudden he started having substernal chest tightness with significant shortness of breath and sweating. The episode lasted for about 15 minutes. Since then, he had more exertional chest tightness than before but has not been as intense. He has been taking his medications regularly.   Past Medical History  Diagnosis Date  . Hernia 1991    02/10/2012-RIH repair  . Personal history of tobacco use, presenting hazards to health 2012  . Hypertension 2008  . Inguinal hernia without mention of obstruction or gangrene, recurrent unilateral or unspecified 2012  . Obesity, unspecified 2012  . Umbilical hernia without mention of obstruction or gangrene 2012  . Diabetes mellitus without complication ([place])   .  Hyperlipidemia   . Degeneration of intervertebral disc of cervical region   . Sleep apnea   . CAD (coronary artery disease)     a. cardiac cath 05/30/2015: LM nl, mLAD 50%, LCx minor irregs, RCA minor irregs, EF 55-65%, no WMA, no MR/AS, nl LVEDP, recommend aggressive med Rx  . Allergy     Past Surgical History  Procedure Laterality Date  . Back surgery  12/2008  . Colonoscopy  2014    Dr. [name]  . Hernia repair  1991  . Inguinal hernia repair Right 2012  . Inguinal hernia repair Right 2014  . Cardiac catheterization N/A 05/30/2015    Procedure: Left Heart Cath;  Surgeon: Wellington Hampshire, MD;  Location: [place] [facility];  Service: Cardiovascular;  Laterality: N/A;     Current Outpatient Prescriptions  Medication Sig Dispense Refill  . Armodafinil (NUVIGIL) 150 MG tablet Take 1 tablet (150 mg total) by mouth daily. 30 tablet 2  . aspirin 81 MG tablet Take 81 mg by mouth daily.    Marland Kitchen atorvastatin (LIPITOR) 80 MG tablet Take 1 tablet (80 mg total) by mouth daily. 30 tablet 5  . carvedilol (COREG) 12.5 MG tablet Take 1 tablet (12.5 mg total) by mouth 2 (two) times daily. 60 tablet 5  . EXFORGE HCT 5-160-12.5 MG TABS tablet by mouth once daily 30 tablet 5  . FLECTOR 1.3 % PTCH Place 1 patch onto the skin 2 (two) times daily. 60 patch 5  . fluticasone (FLONASE) 50 MCG/ACT nasal spray Place 2 sprays into the  nose as needed.    . loratadine (CLARITIN) 10 MG tablet Take 10 mg by mouth as needed.     . metFORMIN (GLUCOPHAGE) 850 MG tablet Take 1 tablet (850 mg total) by mouth 2 (two) times daily with a meal. 60 tablet 5  . nitroGLYCERIN (NITROSTAT) 0.4 MG SL tablet Place 1 tablet (0.4 mg total) under the tongue every 5 (five) minutes as needed for chest pain. 15 tablet 1  . omeprazole (PRILOSEC) 20 MG capsule Take 1 capsule (20 mg total) by mouth every morning. 30 capsule 5  . polyethylene glycol powder (GLYCOLAX/MIRALAX) powder Take 8 g by mouth as needed. For constipation  0  . ranolazine  (RANEXA) 1000 MG SR tablet Take 1 tablet (1,000 mg total) by mouth 2 (two) times daily. 60 tablet 5  . sildenafil (REVATIO) 20 MG tablet take 1-2 tablets by mouth once daily if needed 60 tablet 2   No current facility-administered medications for this visit.    Allergies:   Gabapentin; Latex; and Penicillins    Social History:  The patient  reports that he quit smoking about 10 years ago. His smoking use included Cigars. He started smoking about 13 years ago. He has never used smokeless tobacco. He reports that he drinks about 0.6 oz of alcohol per week. He reports that he does not use illicit drugs.   Family History:  The patient's family history includes Heart Problems in his mother; Heart Problems (age of onset: 78) in his father; Mental illness in his brother.    ROS:  Please see the history of present illness.   Otherwise, review of systems are positive for none.   All other systems are reviewed and negative.    PHYSICAL EXAM: VS:  BP 120/90 mmHg  Pulse 63  Ht 5\' 11"  (1.803 m)  Wt 262 lb 8 oz (119.069 kg)  BMI 36.63 kg/m2 , BMI Body mass index is 36.63 kg/(m^2). GEN: Well nourished, well developed, in no acute distress HEENT: normal Neck: no JVD, carotid bruits, or masses Cardiac: RRR; no murmurs, rubs, or gallops,no edema  Respiratory:  clear to auscultation bilaterally, normal work of breathing GI: soft, nontender, nondistended, + BS MS: no deformity or atrophy Skin: warm and dry, no rash Neuro:  Strength and sensation are intact Psych: euthymic mood, full affect   EKG:  EKG is ordered today. The ekg ordered today demonstrates normal sinus rhythm with nonspecific anterior T wave changes. These T wave changes are more prominent than before.   Recent Labs: 09/05/2015: ALT 27; BUN 15; Creatinine, Ser 1.09; Platelets 261; Potassium 4.1; Sodium 140    Lipid Panel    Component Value Date/Time   CHOL 192 09/05/2015 1131   CHOL 222* 05/23/2014   TRIG 203* 09/05/2015 1131     HDL 53 09/05/2015 1131   HDL 46 05/23/2014   CHOLHDL 3.6 09/05/2015 1131   LDLCALC 98 09/05/2015 1131   LDLCALC 145 05/23/2014      Wt Readings from Last 3 Encounters:  03/17/16 262 lb 8 oz (119.069 kg)  03/05/16 258 lb (117.028 kg)  12/05/15 260 lb 14.4 oz (118.343 kg)        ASSESSMENT AND PLAN:  1.  Coronary artery disease involving native coronary arteries with other forms of angina: The patient's symptoms initially improved significantly with antianginal therapy especially Ranexa. However, he reports worsening symptoms over the last few weeks after what seems to be a viral illness. His EKG is showing more prominent anterior T  wave changes. Previous cardiac catheterization last year showed moderate one-vessel coronary artery disease affecting his LAD. I requested a treadmill nuclear stress test for evaluation. Continue antianginal therapy for now and if stress test is normal, he can resume his physical activities.  2. Hyperlipidemia: His LDL improved significantly with atorvastatin 80 mg once daily but still not at target with an LDL of 98. The patient was instructed to follow a healthy diet and if this does not improve to below 70, I will consider adding Zetia.  3. Essential hypertension: Blood pressure is well controlled on current medications.     Disposition:   FU with me in 3 months  Signed,  [name], MD  03/17/2016 3:48 PM    [place]

## 2016-03-18 ENCOUNTER — Inpatient Hospital Stay: Payer: Medicaid Other | Attending: Oncology

## 2016-03-18 ENCOUNTER — Inpatient Hospital Stay (HOSPITAL_BASED_OUTPATIENT_CLINIC_OR_DEPARTMENT_OTHER): Payer: Medicaid Other | Admitting: Oncology

## 2016-03-18 VITALS — BP 165/85 | HR 64 | Temp 96.7°F | Resp 18 | Ht 71.0 in | Wt 260.7 lb

## 2016-03-18 DIAGNOSIS — D751 Secondary polycythemia: Secondary | ICD-10-CM | POA: Diagnosis not present

## 2016-03-18 DIAGNOSIS — Z79899 Other long term (current) drug therapy: Secondary | ICD-10-CM | POA: Diagnosis not present

## 2016-03-18 DIAGNOSIS — G4730 Sleep apnea, unspecified: Secondary | ICD-10-CM | POA: Insufficient documentation

## 2016-03-18 DIAGNOSIS — Z7982 Long term (current) use of aspirin: Secondary | ICD-10-CM

## 2016-03-18 DIAGNOSIS — E785 Hyperlipidemia, unspecified: Secondary | ICD-10-CM | POA: Diagnosis not present

## 2016-03-18 DIAGNOSIS — E119 Type 2 diabetes mellitus without complications: Secondary | ICD-10-CM | POA: Insufficient documentation

## 2016-03-18 DIAGNOSIS — Z7984 Long term (current) use of oral hypoglycemic drugs: Secondary | ICD-10-CM | POA: Insufficient documentation

## 2016-03-18 DIAGNOSIS — E669 Obesity, unspecified: Secondary | ICD-10-CM | POA: Insufficient documentation

## 2016-03-18 DIAGNOSIS — I10 Essential (primary) hypertension: Secondary | ICD-10-CM

## 2016-03-18 DIAGNOSIS — Z87891 Personal history of nicotine dependence: Secondary | ICD-10-CM

## 2016-03-18 DIAGNOSIS — I2510 Atherosclerotic heart disease of native coronary artery without angina pectoris: Secondary | ICD-10-CM | POA: Insufficient documentation

## 2016-03-18 LAB — CBC WITH DIFFERENTIAL/PLATELET
BASOS ABS: 0.1 10*3/uL (ref 0–0.1)
BASOS PCT: 1 %
Eosinophils Absolute: 0.1 10*3/uL (ref 0–0.7)
Eosinophils Relative: 2 %
HEMATOCRIT: 46.8 % (ref 40.0–52.0)
HEMOGLOBIN: 16.3 g/dL (ref 13.0–18.0)
LYMPHS PCT: 35 %
Lymphs Abs: 2.9 10*3/uL (ref 1.0–3.6)
MCH: 32.3 pg (ref 26.0–34.0)
MCHC: 34.7 g/dL (ref 32.0–36.0)
MCV: 93 fL (ref 80.0–100.0)
MONOS PCT: 9 %
Monocytes Absolute: 0.8 10*3/uL (ref 0.2–1.0)
NEUTROS ABS: 4.5 10*3/uL (ref 1.4–6.5)
NEUTROS PCT: 53 %
Platelets: 238 10*3/uL (ref 150–440)
RBC: 5.04 MIL/uL (ref 4.40–5.90)
RDW: 14.6 % — AB (ref 11.5–14.5)
WBC: 8.3 10*3/uL (ref 3.8–10.6)

## 2016-03-18 NOTE — Progress Notes (Signed)
Increased Fatigue

## 2016-03-19 ENCOUNTER — Telehealth: Payer: Self-pay | Admitting: Cardiovascular Disease

## 2016-03-19 NOTE — Telephone Encounter (Signed)
Left detailed message regarding myoview instructions on pt cell VM. Provided CB number if questions.

## 2016-03-20 ENCOUNTER — Encounter
Admission: RE | Admit: 2016-03-20 | Discharge: 2016-03-20 | Disposition: A | Discharge status: Home / Self Care | Payer: Medicaid Other | Source: Ambulatory Visit | Attending: Cardiovascular Disease | Admitting: Cardiovascular Disease

## 2016-03-20 DIAGNOSIS — R931 Abnormal findings on diagnostic imaging of heart and coronary circulation: Secondary | ICD-10-CM | POA: Insufficient documentation

## 2016-03-20 DIAGNOSIS — R079 Chest pain, unspecified: Secondary | ICD-10-CM | POA: Insufficient documentation

## 2016-03-20 DIAGNOSIS — I209 Angina pectoris, unspecified: Secondary | ICD-10-CM | POA: Diagnosis not present

## 2016-03-20 LAB — NM MYOCAR MULTI W/SPECT W/WALL MOTION / EF
CHL CUP NUCLEAR SDS: 0
CHL CUP NUCLEAR SRS: 7
CHL CUP NUCLEAR SSS: 0
CSEPHR: 46 %
LV dias vol: 120 mL (ref 62–150)
LV sys vol: 53 mL
Peak HR: 78 {beats}/min
Rest HR: 61 {beats}/min
TID: 0.87

## 2016-03-20 MED ORDER — TECHNETIUM TC 99M SESTAMIBI - CARDIOLITE
12.4400 | Freq: Once | INTRAVENOUS | Status: AC | PRN
  Administered 2016-03-20: 08:00:00 12.442 via INTRAVENOUS

## 2016-03-30 NOTE — Progress Notes (Signed)
[place]  [phone] [phone]  ID: [name] OB: [birth year]  MR#: [id number]  RFF#:[id number]  Patient Care Team: Steele Sizer, MD as PCP - General  CHIEF COMPLAINT:  Chief Complaint  Patient presents with  . Follow-up    Polycythemia    INTERVAL HISTORY: Patient returns to clinic today for repeat laboratory work and further evaluation. He has increased fatigue, but otherwise feels well. He has no neurologic complaints. He denies any fevers. He has a good appetite and denies weight loss. He has no chest pain or shortness of breath. He denies any nausea, vomiting, constipation, or diarrhea. He has no urinary complaints. Patient offers no further specific complaints today.  REVIEW OF SYSTEMS:   Review of Systems  Constitutional: Positive for malaise/fatigue. Negative for fever and weight loss.  Respiratory: Negative.   Cardiovascular: Negative.   Gastrointestinal: Negative.   Musculoskeletal: Negative.   Neurological: Negative.  Negative for weakness.    As per HPI. Otherwise, a complete review of systems is negatve.  PAST MEDICAL HISTORY: Past Medical History  Diagnosis Date  . Hernia 1991    02/10/2012-RIH repair  . Personal history of tobacco use, presenting hazards to health 2012  . Hypertension 2008  . Inguinal hernia without mention of obstruction or gangrene, recurrent unilateral or unspecified 2012  . Obesity, unspecified 2012  . Umbilical hernia without mention of obstruction or gangrene 2012  . Diabetes mellitus without complication ([place])   . Hyperlipidemia   . Degeneration of intervertebral disc of cervical region   . Sleep apnea   . CAD (coronary artery disease)     a. cardiac cath 05/30/2015: LM nl, mLAD 50%, LCx minor irregs, RCA minor irregs, EF 55-65%, no WMA, no MR/AS, nl LVEDP, recommend aggressive med Rx  . Allergy     PAST SURGICAL HISTORY: Past Surgical History  Procedure Laterality Date  . Back  surgery  12/2008  . Colonoscopy  2014    Dr. [name]  . Hernia repair  1991  . Inguinal hernia repair Right 2012  . Inguinal hernia repair Right 2014  . Cardiac catheterization N/A 05/30/2015    Procedure: Left Heart Cath;  Surgeon: Wellington Hampshire, MD;  Location: [facility];  Service: Cardiovascular;  Laterality: N/A;    FAMILY HISTORY Family History  Problem Relation Age of Onset  . Heart Problems Mother   . Heart Problems Father 45    myocardial infarction  . Mental illness Brother        ADVANCED DIRECTIVES:    HEALTH MAINTENANCE: Social History  Substance Use Topics  . Smoking status: Former Smoker -- 2 years    Types: Cigars    Start date: 12/28/2002    Quit date: 06/04/2005  . Smokeless tobacco: Never Used  . Alcohol Use: 0.6 oz/week    1 Glasses of wine per week     Colonoscopy:  PAP:  Bone density:  Lipid panel:  Allergies  Allergen Reactions  . Gabapentin     Groggy-Mood Changes  . Latex   . Penicillins Hives and Rash    Current Outpatient Prescriptions  Medication Sig Dispense Refill  . Armodafinil (NUVIGIL) 150 MG tablet Take 1 tablet (150 mg total) by mouth daily. 30 tablet 2  . aspirin 81 MG tablet Take 81 mg by mouth daily.    Marland Kitchen atorvastatin (LIPITOR) 80 MG tablet Take 1 tablet (80 mg total) by mouth daily. 30 tablet 5  . carvedilol (COREG) 12.5 MG  tablet Take 1 tablet (12.5 mg total) by mouth 2 (two) times daily. 60 tablet 5  . EXFORGE HCT 5-160-12.5 MG TABS tablet by mouth once daily 30 tablet 5  . FLECTOR 1.3 % PTCH Place 1 patch onto the skin 2 (two) times daily. 60 patch 5  . fluticasone (FLONASE) 50 MCG/ACT nasal spray Place 2 sprays into the nose as needed.    . loratadine (CLARITIN) 10 MG tablet Take 10 mg by mouth as needed.     . metFORMIN (GLUCOPHAGE) 850 MG tablet Take 1 tablet (850 mg total) by mouth 2 (two) times daily with a meal. 60 tablet 5  . nitroGLYCERIN (NITROSTAT) 0.4 MG SL tablet Place 1 tablet (0.4 mg total)  under the tongue every 5 (five) minutes as needed for chest pain. 15 tablet 1  . omeprazole (PRILOSEC) 20 MG capsule Take 1 capsule (20 mg total) by mouth every morning. 30 capsule 5  . polyethylene glycol powder (GLYCOLAX/MIRALAX) powder Take 8 g by mouth as needed. For constipation  0  . ranolazine (RANEXA) 1000 MG SR tablet Take 1 tablet (1,000 mg total) by mouth 2 (two) times daily. 60 tablet 5  . sildenafil (REVATIO) 20 MG tablet take 1-2 tablets by mouth once daily if needed 60 tablet 2   No current facility-administered medications for this visit.    OBJECTIVE: Filed Vitals:   03/18/16 1044  BP: 165/85  Pulse: 64  Temp: 96.7 F (35.9 C)  Resp: 18     Body mass index is 36.38 kg/(m^2).    ECOG FS:0 - Asymptomatic  General: Well-developed, well-nourished, no acute distress. Eyes: Pink conjunctiva, anicteric sclera. Lungs: Clear to auscultation bilaterally. Heart: Regular rate and rhythm. No rubs, murmurs, or gallops. Abdomen: Soft, nontender, nondistended. No organomegaly noted, normoactive bowel sounds. Musculoskeletal: No edema, cyanosis, or clubbing. Neuro: Alert, answering all questions appropriately. Cranial nerves grossly intact. Skin: No rashes or petechiae noted. Psych: Normal affect.   LAB RESULTS:  Lab Results  Component Value Date   NA 140 09/05/2015   K 4.1 09/05/2015   CL 98 09/05/2015   CO2 23 09/05/2015   GLUCOSE 108* 09/05/2015   BUN 15 09/05/2015   CREATININE 1.09 09/05/2015   CALCIUM 9.8 09/05/2015   PROT 8.2 09/05/2015   ALBUMIN 4.8 09/05/2015   AST 33 09/05/2015   ALT 27 09/05/2015   ALKPHOS 84 09/05/2015   BILITOT 0.7 09/05/2015   GFRNONAA 77 09/05/2015   GFRAA 89 09/05/2015    Lab Results  Component Value Date   WBC 8.3 03/18/2016   NEUTROABS 4.5 03/18/2016   HGB 16.3 03/18/2016   HCT 46.8 03/18/2016   MCV 93.0 03/18/2016   PLT 238 03/18/2016     STUDIES: Nm Myocar Multi W/spect W/wall Motion / Ef  03/20/2016   T wave  inversion was noted during stress in the II, III, V5, V6 and V4 leads.  Downsloping ST segment depression ST segment depression was noted during stress in the II, III, aVF, V5, V6 and V4 leads.  Defect 1: There is a small defect of mild severity present in the mid anterior location.  Findings consistent with mild LAD ischemia  This is a low risk study.  The left ventricular ejection fraction is normal (55-65%).     ASSESSMENT: Secondary polycythemia.  PLAN:    1. Polycythemia: Patient's previous records and laboratory work were reviewed extensively.  His hemoglobin from today is stable at 16.3. His polycythemia is likely secondary to patient's obstructive sleep  apnea. Previously his entire workup including bone marrow biopsy in August 2008 as well as JAK-2 mutation was either negative or within normal limits. Patient's continues to be below his goal hematocrit of 48%. No intervention is needed at this time. Patient does not require phlebotomy today. No further follow-up is necessary. Please refer patient back if his hemoglobin begins to trend up or he becomes symptomatic.  Patient expressed understanding and was in agreement with this plan. He also understands that He can call clinic at any time with any questions, concerns, or complaints.   Lloyd Huger, MD   03/30/2016 4:38 PM

## 2016-04-07 ENCOUNTER — Encounter: Payer: Self-pay | Admitting: Cardiovascular Disease

## 2016-04-07 ENCOUNTER — Other Ambulatory Visit: Payer: Self-pay

## 2016-04-07 ENCOUNTER — Telehealth: Payer: Self-pay | Admitting: Cardiovascular Disease

## 2016-04-07 DIAGNOSIS — Z01812 Encounter for preprocedural laboratory examination: Secondary | ICD-10-CM

## 2016-04-07 NOTE — Telephone Encounter (Signed)
S/w pt regarding recommendation of left heart cath d/t symptoms. Pt agreeable w/plan Scheduled April 20, 7:30am, [facility]. Reviewed instructions. Pt states he will come to our office tomorrow to pick up instructions. He understands to have labs at [facility] this week.  Pt verbalized understanding with no further questions.

## 2016-04-07 NOTE — Telephone Encounter (Signed)
Patient said after recent nm test he was told that if he continued to have sob then dr. Fletcher Anon would maybe do a stent .  Patient wants an appt to talk about this.  Please call to advise on need for appt/ careplan.

## 2016-04-07 NOTE — Telephone Encounter (Signed)
S/w pt who reports SOB and dizziness while mowing lawn yesterday.  States his SOB is getting worse and lasting 49min-1 hour. He will take nitro which he states helps.  Pt describes chest pressure as "there is a twisting like you are wringing out a wash cloth" when he exerts himself. Denies any other symptoms. He would like to consider cardiac cath next week to determine if stenosis has worsened since June 2016 cardiac cath.  Informed pt I will forward to MD for further recommendations. Pt verbalized understanding.    Per MD notes: Notes Recorded by Wellington Hampshire, MD on 03/20/2016 at 2:02 PM Inform patient that stress test was mildly abnormal but overall low risk. This means we can continue with medications but if his chest pain pain with activities does not improve, then we need to repeat his heart cath and possibly place a stent.

## 2016-04-07 NOTE — Telephone Encounter (Signed)
Schedule left heart cath for next week.

## 2016-04-07 NOTE — Patient Instructions (Signed)
Your physician has requested that you have a cardiac catheterization. Cardiac catheterization is used to diagnose and/or treat various heart conditions. Doctors may recommend this procedure for a number of different reasons. The most common reason is to evaluate chest pain. Chest pain can be a symptom of coronary artery disease (CAD), and cardiac catheterization can show whether plaque is narrowing or blocking your heart's arteries. This procedure is also used to evaluate the valves, as well as measure the blood flow and oxygen levels in different parts of your heart. For further information please visit [email or web address]. Please follow instruction sheet, as given.  [facility] Cardiac Cath Instructions   You are scheduled for a Cardiac Cath on: Thursday, April 20  Please arrive at 6:30am on the day of your procedure  You will need to pre-register prior to the day of your procedure.  Enter through the [employer] at [facility].  Registration is the first desk on your right.  Please take the procedure order we have given you in order to be registered appropriately  Do not eat/drink anything after midnight  Someone will need to drive you home  It is recommended someone be with you for the first 24 hours after your procedure  Wear clothes that are easy to get on/off and wear slip on shoes if possible   Medications bring a current list of all medications with you    __xx_ Do not take these medications before your procedure: Do not take metformin 24 hours before AND for 48 hours after your procedure.   You may take your other medications with a sip of water.   Day of your procedure: Arrive at the [facility] entrance.  Free valet service is available.  After entering the [facility] please check-in at the registration desk (1st desk on your right) to receive your armband. After receiving your armband someone will escort you to the cardiac cath/special procedures waiting area.  The usual length of  stay after your procedure is about 2 to 3 hours.  This can vary.  If you have any questions, please call our office at [phone], or you may call the cardiac cath lab at [facility] directly at [phone] Angiogram An angiogram, also called angiography, is a procedure used to look at the blood vessels. In this procedure, dye is injected through a long, thin tube (catheter) into an artery. X-rays are then taken. The X-rays will show if there is a blockage or problem in a blood vessel.  LET [facility] CARE PROVIDER KNOW ABOUT:  Any allergies you have, including allergies to shellfish or contrast dye.   All medicines you are taking, including vitamins, herbs, eye drops, creams, and over-the-counter medicines.   Previous problems you or members of your family have had with the use of anesthetics.   Any blood disorders you have.   Previous surgeries you have had.  Any previous kidney problems or failure you have had.  Medical conditions you have.   Possibility of pregnancy, if this applies. RISKS AND COMPLICATIONS Generally, an angiogram is a safe procedure. However, as with any procedure, problems can occur. Possible problems include:  Injury to the blood vessels, including rupture or bleeding.  Infection or bruising at the catheter site.  Allergic reaction to the dye or contrast used.  Kidney damage from the dye or contrast used.  Blood clots that can lead to a stroke or heart attack. BEFORE THE PROCEDURE  Do not eat or drink after midnight on the night before the  procedure, or as directed by your health care provider.   Ask your health care provider if you may drink enough water to take any needed medicines the morning of the procedure.  PROCEDURE  You may be given a medicine to help you relax (sedative) before and during the procedure. This medicine is given through an IV access tube that is inserted into one of your veins.   The area where the catheter will be  inserted will be washed and shaved. This is usually done in the groin but may be done in the fold of your arm (near your elbow) or in the wrist.  A medicine will be given to numb the area where the catheter will be inserted (local anesthetic).  The catheter will be inserted with a guide wire into an artery. The catheter is guided by using a type of X-ray (fluoroscopy) to the blood vessel being examined.   Dye is then injected into the catheter, and X-rays are taken. The dye helps to show where any narrowing or blockages are located.  AFTER THE PROCEDURE   If the procedure is done through the leg, you will be kept in bed lying flat for several hours. You will be instructed to not bend or cross your legs.  The insertion site will be checked frequently.  The pulse in your feet or wrist will be checked frequently.  Additional blood tests, X-rays, and electrocardiography may be done.   You may need to stay in the hospital overnight for observation.    This information is not intended to replace advice given to you by your health care provider. Make sure you discuss any questions you have with your health care provider.   Document Released: 09/23/2005 Document Revised: 01/04/2015 Document Reviewed: 05/17/2013 Elsevier Interactive Patient Education 2016 [place] Refer to this sheet in the next few weeks. These instructions provide you with information about caring for yourself after your procedure. Your health care provider may also give you more specific instructions. Your treatment has been planned according to current medical practices, but problems sometimes occur. Call your health care provider if you have any problems or questions after your procedure. WHAT TO EXPECT AFTER THE PROCEDURE After your procedure, it is typical to have the following:  Bruising at the catheter insertion site that usually fades within 1-2 weeks.  Blood collecting in the tissue  (hematoma) that may be painful to the touch. It should usually decrease in size and tenderness within 1-2 weeks. HOME CARE INSTRUCTIONS  Take medicines only as directed by your health care provider.  You may shower 24-48 hours after the procedure or as directed by your health care provider. Remove the bandage (dressing) and gently wash the site with plain soap and water. Pat the area dry with a clean towel. Do not rub the site, because this may cause bleeding.  Do not take baths, swim, or use a hot tub until your health care provider approves.  Check your insertion site every day for redness, swelling, or drainage.  Do not apply powder or lotion to the site.  Do not lift over 10 lb (4.5 kg) for 5 days after your procedure or as directed by your health care provider.  Ask your health care provider when it is okay to:  Return to work or school.  Resume usual physical activities or sports.  Resume sexual activity.  Do not drive home if you are discharged the same day as the procedure. Have someone  else drive you.  You may drive 24 hours after the procedure unless otherwise instructed by your health care provider.  Do not operate machinery or power tools for 24 hours after the procedure or as directed by your health care provider.  If your procedure was done as an outpatient procedure, which means that you went home the same day as your procedure, a responsible adult should be with you for the first 24 hours after you arrive home.  Keep all follow-up visits as directed by your health care provider. This is important. SEEK MEDICAL CARE IF:  You have a fever.  You have chills.  You have increased bleeding from the catheter insertion site. Hold pressure on the site. SEEK IMMEDIATE MEDICAL CARE IF:  You have unusual pain at the catheter insertion site.  You have redness, warmth, or swelling at the catheter insertion site.  You have drainage (other than a small amount of blood on  the dressing) from the catheter insertion site.  The catheter insertion site is bleeding, and the bleeding does not stop after 30 minutes of holding steady pressure on the site.  The area near or just beyond the catheter insertion site becomes pale, cool, tingly, or numb.   This information is not intended to replace advice given to you by your health care provider. Make sure you discuss any questions you have with your health care provider.   Document Released: 07/02/2005 Document Revised: 01/04/2015 Document Reviewed: 05/17/2013 Elsevier Interactive Patient Education [employer].

## 2016-04-14 ENCOUNTER — Other Ambulatory Visit
Admission: RE | Admit: 2016-04-14 | Discharge: 2016-04-14 | Disposition: A | Discharge status: Home / Self Care | Payer: Medicaid Other | Source: Ambulatory Visit | Attending: Family Medicine | Admitting: Family Medicine

## 2016-04-14 DIAGNOSIS — Z01812 Encounter for preprocedural laboratory examination: Secondary | ICD-10-CM | POA: Diagnosis present

## 2016-04-14 LAB — BASIC METABOLIC PANEL
Anion gap: 7 (ref 5–15)
BUN: 14 mg/dL (ref 6–20)
CALCIUM: 8.9 mg/dL (ref 8.9–10.3)
CHLORIDE: 107 mmol/L (ref 101–111)
CO2: 25 mmol/L (ref 22–32)
Creatinine, Ser: 1.03 mg/dL (ref 0.61–1.24)
GFR calc non Af Amer: >60 mL/min (ref >60)
GLUCOSE: 144 mg/dL — AB (ref 65–99)
Potassium: 4 mmol/L (ref 3.5–5.1)
Sodium: 139 mmol/L (ref 135–145)

## 2016-04-14 LAB — CBC
HEMATOCRIT: 44.7 % (ref 40.0–52.0)
HEMOGLOBIN: 15.2 g/dL (ref 13.0–18.0)
MCH: 31.8 pg (ref 26.0–34.0)
MCHC: 34 g/dL (ref 32.0–36.0)
MCV: 93.4 fL (ref 80.0–100.0)
Platelets: 210 10*3/uL (ref 150–440)
RBC: 4.79 MIL/uL (ref 4.40–5.90)
RDW: 14.3 % (ref 11.5–14.5)
WBC: 7.7 10*3/uL (ref 3.8–10.6)

## 2016-04-14 LAB — PROTIME-INR
INR: 1.02
Prothrombin Time: 13.6 seconds (ref 11.4–15.0)

## 2016-04-16 ENCOUNTER — Ambulatory Visit
Admission: RE | Admit: 2016-04-16 | Discharge: 2016-04-16 | Disposition: A | Discharge status: Home / Self Care | Payer: Medicaid Other | Source: Ambulatory Visit | Attending: Cardiovascular Disease | Admitting: Cardiovascular Disease

## 2016-04-16 ENCOUNTER — Encounter: Payer: Self-pay | Admitting: *Deleted

## 2016-04-16 ENCOUNTER — Encounter
Admission: RE | Disposition: A | Discharge status: Home / Self Care | Payer: Self-pay | Source: Ambulatory Visit | Attending: Cardiovascular Disease

## 2016-04-16 DIAGNOSIS — Z880 Allergy status to penicillin: Secondary | ICD-10-CM | POA: Insufficient documentation

## 2016-04-16 DIAGNOSIS — I10 Essential (primary) hypertension: Secondary | ICD-10-CM | POA: Diagnosis not present

## 2016-04-16 DIAGNOSIS — E785 Hyperlipidemia, unspecified: Secondary | ICD-10-CM | POA: Insufficient documentation

## 2016-04-16 DIAGNOSIS — G4730 Sleep apnea, unspecified: Secondary | ICD-10-CM | POA: Insufficient documentation

## 2016-04-16 DIAGNOSIS — Z888 Allergy status to other drugs, medicaments and biological substances status: Secondary | ICD-10-CM | POA: Insufficient documentation

## 2016-04-16 DIAGNOSIS — M5030 Other cervical disc degeneration, unspecified cervical region: Secondary | ICD-10-CM | POA: Insufficient documentation

## 2016-04-16 DIAGNOSIS — Z91040 Latex allergy status: Secondary | ICD-10-CM | POA: Diagnosis not present

## 2016-04-16 DIAGNOSIS — Z7984 Long term (current) use of oral hypoglycemic drugs: Secondary | ICD-10-CM | POA: Insufficient documentation

## 2016-04-16 DIAGNOSIS — I2510 Atherosclerotic heart disease of native coronary artery without angina pectoris: Secondary | ICD-10-CM | POA: Insufficient documentation

## 2016-04-16 DIAGNOSIS — Z6836 Body mass index (BMI) 36.0-36.9, adult: Secondary | ICD-10-CM | POA: Insufficient documentation

## 2016-04-16 DIAGNOSIS — Z8249 Family history of ischemic heart disease and other diseases of the circulatory system: Secondary | ICD-10-CM | POA: Insufficient documentation

## 2016-04-16 DIAGNOSIS — I25118 Atherosclerotic heart disease of native coronary artery with other forms of angina pectoris: Secondary | ICD-10-CM | POA: Diagnosis not present

## 2016-04-16 DIAGNOSIS — Z7982 Long term (current) use of aspirin: Secondary | ICD-10-CM | POA: Diagnosis not present

## 2016-04-16 DIAGNOSIS — I25119 Atherosclerotic heart disease of native coronary artery with unspecified angina pectoris: Secondary | ICD-10-CM | POA: Diagnosis present

## 2016-04-16 DIAGNOSIS — R0602 Shortness of breath: Secondary | ICD-10-CM | POA: Diagnosis not present

## 2016-04-16 DIAGNOSIS — E669 Obesity, unspecified: Secondary | ICD-10-CM | POA: Insufficient documentation

## 2016-04-16 DIAGNOSIS — Z79899 Other long term (current) drug therapy: Secondary | ICD-10-CM | POA: Insufficient documentation

## 2016-04-16 DIAGNOSIS — E119 Type 2 diabetes mellitus without complications: Secondary | ICD-10-CM | POA: Diagnosis not present

## 2016-04-16 DIAGNOSIS — Z87891 Personal history of nicotine dependence: Secondary | ICD-10-CM | POA: Insufficient documentation

## 2016-04-16 HISTORY — PX: CARDIAC CATHETERIZATION: SHX172

## 2016-04-16 SURGERY — LEFT HEART CATH AND CORONARY ANGIOGRAPHY
Anesthesia: Moderate Sedation

## 2016-04-16 MED ORDER — ASPIRIN 81 MG PO CHEW: 81.0000 mg | CHEWABLE_TABLET | ORAL | Status: DC

## 2016-04-16 MED ORDER — SODIUM CHLORIDE 0.9% FLUSH: 3.0000 mL | Freq: Two times a day (BID) | INTRAVENOUS | Status: DC

## 2016-04-16 MED ORDER — HEPARIN SODIUM (PORCINE) 1000 UNIT/ML IJ SOLN
INTRAMUSCULAR | Status: AC
  Filled 2016-04-16: qty 1

## 2016-04-16 MED ORDER — VERAPAMIL HCL 2.5 MG/ML IV SOLN
INTRAVENOUS | Status: AC
  Filled 2016-04-16: qty 2

## 2016-04-16 MED ORDER — FENTANYL CITRATE (PF) 100 MCG/2ML IJ SOLN
INTRAMUSCULAR | Status: DC | PRN
  Administered 2016-04-16: 25 ug via INTRAVENOUS

## 2016-04-16 MED ORDER — ADENOSINE (DIAGNOSTIC) 3 MG/ML IV SOLN
INTRAVENOUS | Status: AC
  Filled 2016-04-16: qty 30

## 2016-04-16 MED ORDER — HEPARIN (PORCINE) IN NACL 2-0.9 UNIT/ML-% IJ SOLN
INTRAMUSCULAR | Status: AC
  Filled 2016-04-16: qty 500

## 2016-04-16 MED ORDER — SODIUM CHLORIDE 0.9 % IV SOLN: 250.0000 mL | INTRAVENOUS | Status: DC | PRN

## 2016-04-16 MED ORDER — ASPIRIN 81 MG PO CHEW
CHEWABLE_TABLET | ORAL | Status: AC
  Filled 2016-04-16: qty 1

## 2016-04-16 MED ORDER — MIDAZOLAM HCL 2 MG/2ML IJ SOLN
INTRAMUSCULAR | Status: AC
  Filled 2016-04-16: qty 2

## 2016-04-16 MED ORDER — SODIUM CHLORIDE 0.9% FLUSH: 3.0000 mL | INTRAVENOUS | Status: DC | PRN

## 2016-04-16 MED ORDER — MIDAZOLAM HCL 2 MG/2ML IJ SOLN
INTRAMUSCULAR | Status: DC | PRN
  Administered 2016-04-16: 1 mg via INTRAVENOUS

## 2016-04-16 MED ORDER — SODIUM CHLORIDE 0.9 % IV SOLN: INTRAVENOUS | Status: DC

## 2016-04-16 MED ORDER — HEPARIN SODIUM (PORCINE) 1000 UNIT/ML IJ SOLN
INTRAMUSCULAR | Status: DC | PRN
  Administered 2016-04-16: 6000 [IU] via INTRAVENOUS

## 2016-04-16 MED ORDER — FENTANYL CITRATE (PF) 100 MCG/2ML IJ SOLN
INTRAMUSCULAR | Status: AC
  Filled 2016-04-16: qty 2

## 2016-04-16 MED ORDER — IOPAMIDOL (ISOVUE-300) INJECTION 61%
INTRAVENOUS | Status: DC | PRN
  Administered 2016-04-16: 45 mL via INTRA_ARTERIAL

## 2016-04-16 MED ORDER — VERAPAMIL HCL 2.5 MG/ML IV SOLN
INTRAVENOUS | Status: DC | PRN
  Administered 2016-04-16: 2.5 mg via INTRA_ARTERIAL

## 2016-04-16 SURGICAL SUPPLY — 6 items
CATH OPTITORQUE JACKY 4.0 5F (CATHETERS) ×3 IMPLANT
DEVICE RAD TR BAND REGULAR (VASCULAR PRODUCTS) ×3 IMPLANT
GLIDESHEATH SLEND SS 6F .021 (SHEATH) ×3 IMPLANT
KIT MANI 3VAL PERCEP (MISCELLANEOUS) ×3 IMPLANT
PACK CARDIAC CATH (CUSTOM PROCEDURE TRAY) ×3 IMPLANT
WIRE SAFE-T 1.5MM-J .035X260CM (WIRE) ×3 IMPLANT

## 2016-04-16 NOTE — Interval H&P Note (Signed)
History and Physical Interval Note:  04/16/2016 Cath Lab Visit (complete for each Cath Lab visit)  Clinical Evaluation Leading to the Procedure:   ACS: No.  Non-ACS:    Anginal Classification: CCS III  Anti-ischemic medical therapy: Maximal Therapy (2 or more classes of medications)  Non-Invasive Test Results: Intermediate-risk stress test findings: cardiac mortality 1-3%/year  Prior CABG: No previous CABG       7:50 AM  [name] [name]  has presented today for surgery, with the diagnosis of Chest pain  The various methods of treatment have been discussed with the patient and family. After consideration of risks, benefits and other options for treatment, the patient has consented to  Procedure(s): Left Heart Cath and Coronary Angiography (N/A) as a surgical intervention .  The patient's history has been reviewed, patient examined, no change in status, stable for surgery.  I have reviewed the patient's chart and labs.  Questions were answered to the patient's satisfaction.     [name] [name]

## 2016-04-16 NOTE — Discharge Instructions (Signed)
Resume Metformin in 2 days.

## 2016-04-16 NOTE — H&P (View-Only) (Signed)
Cardiology Office Note   Date:  03/17/2016   ID:  Mehmet, Barbaria 05-04-1961, MRN [id number]  PCP:  [name], MD  Cardiologist:   [name], MD   Chief Complaint  Patient presents with  . other    6 month f/u c/o chest discomfort used nitro left arm numbness with working out, ear ringing, sob and fatigue. Meds reviewed verbally with pt.      History of Present Illness: [name] [name] is a 55 y.o. 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 He has known history of type 2 diabetes, hypertension, hyperlipidemia and obesity.He is a previous smoker. He was seen in 2016 for substernal chest heaviness with activities. He has family history of coronary artery disease. His father died of myocardial infarction at the age of 35. Cardiac catheterization in June 2016 showed moderate mid LAD stenosis (50%) with an FFR ratio of 0.83 . LV systolic function with normal with normal left ventricular end-diastolic pressure. He was thus treated medically and initially improved significantly with Ranexa. However, he recently became ill with an upper respiratory tract infection. He was trying to walk up to health and all of a sudden he started having substernal chest tightness with significant shortness of breath and sweating. The episode lasted for about 15 minutes. Since then, he had more exertional chest tightness than before but has not been as intense. He has been taking his medications regularly.   Past Medical History  Diagnosis Date  . Hernia 1991    02/10/2012-RIH repair  . Personal history of tobacco use, presenting hazards to health 2012  . Hypertension 2008  . Inguinal hernia without mention of obstruction or gangrene, recurrent unilateral or unspecified 2012  . Obesity, unspecified 2012  . Umbilical hernia without mention of obstruction or gangrene 2012  . Diabetes mellitus without complication ([place])   .  Hyperlipidemia   . Degeneration of intervertebral disc of cervical region   . Sleep apnea   . CAD (coronary artery disease)     a. cardiac cath 05/30/2015: LM nl, mLAD 50%, LCx minor irregs, RCA minor irregs, EF 55-65%, no WMA, no MR/AS, nl LVEDP, recommend aggressive med Rx  . Allergy     Past Surgical History  Procedure Laterality Date  . Back surgery  12/2008  . Colonoscopy  2014    Dr. [name]  . Hernia repair  1991  . Inguinal hernia repair Right 2012  . Inguinal hernia repair Right 2014  . Cardiac catheterization N/A 05/30/2015    Procedure: Left Heart Cath;  Surgeon: Wellington Hampshire, MD;  Location: [facility];  Service: Cardiovascular;  Laterality: N/A;     Current Outpatient Prescriptions  Medication Sig Dispense Refill  . Armodafinil (NUVIGIL) 150 MG tablet Take 1 tablet (150 mg total) by mouth daily. 30 tablet 2  . aspirin 81 MG tablet Take 81 mg by mouth daily.    Marland Kitchen atorvastatin (LIPITOR) 80 MG tablet Take 1 tablet (80 mg total) by mouth daily. 30 tablet 5  . carvedilol (COREG) 12.5 MG tablet Take 1 tablet (12.5 mg total) by mouth 2 (two) times daily. 60 tablet 5  . EXFORGE HCT 5-160-12.5 MG TABS tablet by mouth once daily 30 tablet 5  . FLECTOR 1.3 % PTCH Place 1 patch onto the skin 2 (two) times daily. 60 patch 5  . fluticasone (FLONASE) 50 MCG/ACT nasal spray Place 2 sprays into the  nose as needed.    . loratadine (CLARITIN) 10 MG tablet Take 10 mg by mouth as needed.     . metFORMIN (GLUCOPHAGE) 850 MG tablet Take 1 tablet (850 mg total) by mouth 2 (two) times daily with a meal. 60 tablet 5  . nitroGLYCERIN (NITROSTAT) 0.4 MG SL tablet Place 1 tablet (0.4 mg total) under the tongue every 5 (five) minutes as needed for chest pain. 15 tablet 1  . omeprazole (PRILOSEC) 20 MG capsule Take 1 capsule (20 mg total) by mouth every morning. 30 capsule 5  . polyethylene glycol powder (GLYCOLAX/MIRALAX) powder Take 8 g by mouth as needed. For constipation  0  . ranolazine  (RANEXA) 1000 MG SR tablet Take 1 tablet (1,000 mg total) by mouth 2 (two) times daily. 60 tablet 5  . sildenafil (REVATIO) 20 MG tablet take 1-2 tablets by mouth once daily if needed 60 tablet 2   No current facility-administered medications for this visit.    Allergies:   Gabapentin; Latex; and Penicillins    Social History:  The patient  reports that he quit smoking about 10 years ago. His smoking use included Cigars. He started smoking about 13 years ago. He has never used smokeless tobacco. He reports that he drinks about 0.6 oz of alcohol per week. He reports that he does not use illicit drugs.   Family History:  The patient's family history includes Heart Problems in his mother; Heart Problems (age of onset: 30) in his father; Mental illness in his brother.    ROS:  Please see the history of present illness.   Otherwise, review of systems are positive for none.   All other systems are reviewed and negative.    PHYSICAL EXAM: VS:  BP 120/90 mmHg  Pulse 63  Ht 5\' 11"  (1.803 m)  Wt 262 lb 8 oz (119.069 kg)  BMI 36.63 kg/m2 , BMI Body mass index is 36.63 kg/(m^2). GEN: Well nourished, well developed, in no acute distress HEENT: normal Neck: no JVD, carotid bruits, or masses Cardiac: RRR; no murmurs, rubs, or gallops,no edema  Respiratory:  clear to auscultation bilaterally, normal work of breathing GI: soft, nontender, nondistended, + BS MS: no deformity or atrophy Skin: warm and dry, no rash Neuro:  Strength and sensation are intact Psych: euthymic mood, full affect   EKG:  EKG is ordered today. The ekg ordered today demonstrates normal sinus rhythm with nonspecific anterior T wave changes. These T wave changes are more prominent than before.   Recent Labs: 09/05/2015: ALT 27; BUN 15; Creatinine, Ser 1.09; Platelets 261; Potassium 4.1; Sodium 140    Lipid Panel    Component Value Date/Time   CHOL 192 09/05/2015 1131   CHOL 222* 05/23/2014   TRIG 203* 09/05/2015 1131     HDL 53 09/05/2015 1131   HDL 46 05/23/2014   CHOLHDL 3.6 09/05/2015 1131   LDLCALC 98 09/05/2015 1131   LDLCALC 145 05/23/2014      Wt Readings from Last 3 Encounters:  03/17/16 262 lb 8 oz (119.069 kg)  03/05/16 258 lb (117.028 kg)  12/05/15 260 lb 14.4 oz (118.343 kg)        ASSESSMENT AND PLAN:  1.  Coronary artery disease involving native coronary arteries with other forms of angina: The patient's symptoms initially improved significantly with antianginal therapy especially Ranexa. However, he reports worsening symptoms over the last few weeks after what seems to be a viral illness. His EKG is showing more prominent anterior T  wave changes. Previous cardiac catheterization last year showed moderate one-vessel coronary artery disease affecting his LAD. I requested a treadmill nuclear stress test for evaluation. Continue antianginal therapy for now and if stress test is normal, he can resume his physical activities.  2. Hyperlipidemia: His LDL improved significantly with atorvastatin 80 mg once daily but still not at target with an LDL of 98. The patient was instructed to follow a healthy diet and if this does not improve to below 70, I will consider adding Zetia.  3. Essential hypertension: Blood pressure is well controlled on current medications.     Disposition:   FU with me in 3 months  Signed,  [name], MD  03/17/2016 3:48 PM    [place]

## 2016-04-17 ENCOUNTER — Encounter: Payer: Self-pay | Admitting: Cardiovascular Disease

## 2016-04-28 ENCOUNTER — Ambulatory Visit (INDEPENDENT_AMBULATORY_CARE_PROVIDER_SITE_OTHER): Payer: Medicaid Other | Admitting: Physician Assistant

## 2016-04-28 ENCOUNTER — Encounter: Payer: Self-pay | Admitting: Physician Assistant

## 2016-04-28 VITALS — BP 132/90 | HR 98 | Ht 71.0 in | Wt 268.0 lb

## 2016-04-28 DIAGNOSIS — I10 Essential (primary) hypertension: Secondary | ICD-10-CM

## 2016-04-28 DIAGNOSIS — I2510 Atherosclerotic heart disease of native coronary artery without angina pectoris: Secondary | ICD-10-CM

## 2016-04-28 DIAGNOSIS — E785 Hyperlipidemia, unspecified: Secondary | ICD-10-CM

## 2016-04-28 DIAGNOSIS — G4733 Obstructive sleep apnea (adult) (pediatric): Secondary | ICD-10-CM

## 2016-04-28 DIAGNOSIS — Z9989 Dependence on other enabling machines and devices: Secondary | ICD-10-CM

## 2016-04-28 DIAGNOSIS — E669 Obesity, unspecified: Secondary | ICD-10-CM

## 2016-04-28 DIAGNOSIS — I2583 Coronary atherosclerosis due to lipid rich plaque: Secondary | ICD-10-CM

## 2016-04-28 MED ORDER — VALSARTAN 160 MG PO TABS: 160.0000 mg | ORAL_TABLET | Freq: Every day | ORAL | Status: DC

## 2016-04-28 NOTE — Progress Notes (Signed)
Cardiology Office Note Date:  04/28/2016  Patient ID:  [name] [name], [name] [name] 08-Jun-1961, MRN [id number] PCP:  [name], MD  Cardiologist:  Dr. Fletcher Anon, MD    Chief Complaint: Follow up post cardiac cath  History of Present Illness: [name] [name] is a 55 y.o. male with history of 1 vessel CAD medically managed by prior cardiac catheterization on 05/30/2015, stable angina, DM2, HTN, HLD, obesity, OSA on CPAP, and family history of coronary artery disease with his father passing at age 44 who presents for post-cardiac cath follow up.   He was seen in 2016 for exertional substernal chest heaviness. Cardiac cath on 05/30/2015 shwoed moderate mid LAD stenosis at 50% with an FFR ratio of 0.83, LM was normal, LCx with minor irregs, RCA with minor irregs, EF 55-65% without WMA and normal LVEDP. Aggressive medical management was advised which improved significantly with the addition of Ranexa. Unfortunately, he recently developed a URI and was attempting to walk up hill and developed sudden substernal chest tightness with significant SOB and diaphoresis. The symptoms lasted approximately 15 minutes. He had continued to have some chest tightness since that time when he saw Dr. Fletcher Anon, MD on 03/17/16. He underwent treadmill Myoview on 03/20/16 that showed TWI during stress in II, III, V4-V6, downsloping st segment depression during stress in leads II, III, aVF, V4-V6, small defect of mild severity in the mid anterior location, EF 55-65%. Low risk study. Based on this he was advised to undergo cardiac cath if he continued to have symptoms. Patient called on 4/11 stating he was SOB, dizzy, and had some chest pressure while mowing the lawn on 4/10 which symptoms lasting approximately 1 hour. Improved with nitro. Based on this he underwent LHC on 04/16/16 that showed mild 1-vessel CAD with 30% stenosis in the mid LAD. The lesion actually appeared better than what it looked like in June 2016. There was no evidence of  obstructive CAD. Normal LVEDP and LV systolic function. It was recommended he continue aggressive medical therapy with the possibility he may have endothelial dysfunction given his anginal symptoms and abnormal stress test.   Since his cardiac cath he has felt well. He notes one episode of chest tightness and SOB when walking in a fast manner to go from the back of a parking lot to the baseball field to watch his son play baseball. Episode lasted approximately a couple of minutes and self resolved with rest. He otherwise has been symptom free. He is tolerating his medications without issues. BP at home runs in the 120's/80's. He continues to walk almost everyday in his neighborhood in the mornings to avoid the extreme temperatures. He feels like the extreme temperatures exacerbate his symptoms.    Past Medical History  Diagnosis Date  . Hernia 1991    02/10/2012-RIH repair  . Personal history of tobacco use, presenting hazards to health 2012  . Hypertension 2008  . Inguinal hernia without mention of obstruction or gangrene, recurrent unilateral or unspecified 2012  . Obesity, unspecified 2012  . Umbilical hernia without mention of obstruction or gangrene 2012  . Diabetes mellitus without complication ([place])   . Hyperlipidemia   . Degeneration of intervertebral disc of cervical region   . Sleep apnea   . CAD (coronary artery disease)     a. cardiac cath 05/30/2015: LM nl, mLAD 50%, LCx minor irregs, RCA minor irregs, EF 55-65%, no WMA, no MR/AS, nl LVEDP, recommend aggressive med Rx  . Allergy  Past Surgical History  Procedure Laterality Date  . Back surgery  12/2008  . Colonoscopy  2014    Dr. [name]  . Hernia repair  1991  . Inguinal hernia repair Right 2012  . Inguinal hernia repair Right 2014  . Cardiac catheterization N/A 05/30/2015    Procedure: Left Heart Cath;  Surgeon: Wellington Hampshire, MD;  Location: [facility];  Service: Cardiovascular;  Laterality: N/A;  . Cardiac  catheterization N/A 04/16/2016    Procedure: Left Heart Cath and Coronary Angiography;  Surgeon: Wellington Hampshire, MD;  Location: [facility];  Service: Cardiovascular;  Laterality: N/A;    Current Outpatient Prescriptions  Medication Sig Dispense Refill  . Armodafinil (NUVIGIL) 150 MG tablet Take 1 tablet (150 mg total) by mouth daily. 30 tablet 2  . aspirin 81 MG tablet Take 81 mg by mouth daily.    Marland Kitchen atorvastatin (LIPITOR) 80 MG tablet Take 1 tablet (80 mg total) by mouth daily. 30 tablet 5  . carvedilol (COREG) 12.5 MG tablet Take 1 tablet (12.5 mg total) by mouth 2 (two) times daily. 60 tablet 5  . EXFORGE HCT 5-160-12.5 MG TABS tablet by mouth once daily 30 tablet 5  . FLECTOR 1.3 % PTCH Place 1 patch onto the skin 2 (two) times daily. 60 patch 5  . fluticasone (FLONASE) 50 MCG/ACT nasal spray Place 2 sprays into the nose as needed.    . loratadine (CLARITIN) 10 MG tablet Take 10 mg by mouth as needed.     . metFORMIN (GLUCOPHAGE) 850 MG tablet Take 1 tablet (850 mg total) by mouth 2 (two) times daily with a meal. 60 tablet 5  . nitroGLYCERIN (NITROSTAT) 0.4 MG SL tablet Place 1 tablet (0.4 mg total) under the tongue every 5 (five) minutes as needed for chest pain. 15 tablet 1  . omeprazole (PRILOSEC) 20 MG capsule Take 1 capsule (20 mg total) by mouth every morning. 30 capsule 5  . polyethylene glycol powder (GLYCOLAX/MIRALAX) powder Take 8 g by mouth as needed. For constipation  0  . ranolazine (RANEXA) 1000 MG SR tablet Take 1 tablet (1,000 mg total) by mouth 2 (two) times daily. 60 tablet 5  . sildenafil (REVATIO) 20 MG tablet take 1-2 tablets by mouth once daily if needed 60 tablet 2  . valsartan (DIOVAN) 160 MG tablet Take 1 tablet (160 mg total) by mouth daily. 30 tablet 3   No current facility-administered medications for this visit.    Allergies:   Gabapentin; Latex; and Penicillins   Social History:  The patient  reports that he quit smoking about 10 years ago. His  smoking use included Cigars. He started smoking about 13 years ago. He has never used smokeless tobacco. He reports that he drinks about 0.6 oz of alcohol per week. He reports that he does not use illicit drugs.   Family History:  The patient's family history includes Heart Problems in his mother; Heart Problems (age of onset: 23) in his father; Mental illness in his brother.  ROS:   Review of Systems  Constitutional: Positive for malaise/fatigue. Negative for fever, chills, weight loss and diaphoresis.  HENT: Negative for congestion.   Eyes: Negative for discharge and redness.  Respiratory: Positive for shortness of breath. Negative for cough, hemoptysis, sputum production and wheezing.   Cardiovascular: Positive for chest pain. Negative for palpitations, orthopnea, claudication, leg swelling and PND.  Gastrointestinal: Negative for heartburn, nausea, vomiting and abdominal pain.  Musculoskeletal: Negative for myalgias  and falls.  Skin: Negative for rash.  Neurological: Negative for dizziness, tingling, tremors, sensory change, speech change, focal weakness, loss of consciousness and weakness.  Endo/Heme/Allergies: Does not bruise/bleed easily.  Psychiatric/Behavioral: Negative for substance abuse. The patient is not nervous/anxious.   All other systems reviewed and are negative.     PHYSICAL EXAM:  VS:  BP 132/90 mmHg  Pulse 98  Ht 5\' 11"  (1.803 m)  Wt 268 lb (121.564 kg)  BMI 37.39 kg/m2 BMI: Body mass index is 37.39 kg/(m^2). Well nourished, well developed, in no acute distress HEENT: normocephalic, atraumatic Neck: no JVD, carotid bruits or masses Cardiac:  normal S1, S2; RRR; no murmurs, rubs, or gallops Lungs:  clear to auscultation bilaterally, no wheezing, rhonchi or rales Abd: obese, soft, nontender, no hepatomegaly, + BS MS: no deformity or atrophy Ext: no edema, cath site well healed Skin: warm and dry, no rash Neuro:  moves all extremities spontaneously, no focal  abnormalities noted, follows commands Psych: euthymic mood, full affect   EKG:  Was ordered today. Shows sinus bradycardia, 55 bpm, no acute st/t changes   Recent Labs: 09/05/2015: ALT 27 04/14/2016: BUN 14; Creatinine, Ser 1.03; Hemoglobin 15.2; Platelets 210; Potassium 4.0; Sodium 139  09/05/2015: Chol/HDL Ratio 3.6; Cholesterol, Total 192; HDL 53; LDL Calculated 98; Triglycerides 203*   Estimated Creatinine Clearance: 110 mL/min (by C-G formula based on Cr of 1.03).   Wt Readings from Last 3 Encounters:  04/28/16 268 lb (121.564 kg)  04/16/16 260 lb (117.935 kg)  03/18/16 260 lb 11.1 oz (118.25 kg)     Other studies reviewed: Additional studies/records reviewed today include: summarized above  ASSESSMENT AND PLAN:  1. Nonobstructive CAD: Doing well. No symptoms at this time. He does note return of his chest tightness and SOB if he over exerts himself by walking in a hurried manner in the heat. He is otherwise without any symptoms. There is concern for possible microvascular disease. He is already on max dose of Ranexa 100 mg bid. Given that he takes sildenafil approximately 4 times monthly, sometimes in batches Imdur may not be the next best medication to add to his regimen. I will increase his valsartan by adding a 160 mg tab to his Exforge HCT. This will work in conjunction with his Lipitor for the treatment of possible endothelial dysfunction. He was also advised to eat a low fat diet, maintain optimal BP control as well as optimal LDL and HDL. I will check a fasting lipid this week with a cmet. Consider changing Lipitor to Crestor if indicated. He still needs to exercise with walking daily, though walking in a hurried manner in the heat of the day for a baseball game from the back of the parking lot is not ideal. Because this seems to set off his symptoms he would be ok for a handicap placard. He will otherwise continue Coreg 12.5 mg bid, his bradycardic rate (asymptomatic) precludes  further titration at this time. Consider changing Coreg to Bystolic.   2. HTN: Well controlled. Continue current medications as above.   3. HLD: Check FLP/cmet as above. Continue Lipitor 80 mg daily.   4. Obesity/OSA on CPAP: Weight loss advised. Continue CPAP.   5. DM2: Per PCP.   Disposition: F/u with Dr. Fletcher Anon, MD in 3 months  Current medicines are reviewed at length with the patient today.  The patient did not have any concerns regarding medicines.  Melvern Banker PA-C 04/28/2016 3:04 PM     [facility] -  [address] [place], [place] [address] [phone]

## 2016-04-28 NOTE — Patient Instructions (Signed)
Medication Instructions:  Your physician has recommended you make the following change in your medication:  START taking valsartan 160mg  once daily   Labwork: Fasting lipid and CMET. Nothing to eat or drink after midnight the evening before your labs.   Testing/Procedures: none  Follow-Up: Your physician recommends that you schedule a follow-up appointment in: 3 months with Dr. Fletcher Anon.    Any Other Special Instructions Will Be Listed Below (If Applicable).     If you need a refill on your cardiac medications before your next appointment, please call your pharmacy.

## 2016-04-29 ENCOUNTER — Telehealth: Payer: Self-pay

## 2016-04-29 NOTE — Telephone Encounter (Signed)
Prior Authorization started for Valsartan 160 mg take one tablet daily.  Spoke with [place] with a PA # of [id number] with [id number] and REF # [id number].

## 2016-05-01 ENCOUNTER — Other Ambulatory Visit: Payer: Self-pay | Admitting: Family Medicine

## 2016-05-01 NOTE — Telephone Encounter (Signed)
Patient requesting refill. 

## 2016-05-04 ENCOUNTER — Other Ambulatory Visit: Payer: Self-pay

## 2016-05-04 DIAGNOSIS — I10 Essential (primary) hypertension: Secondary | ICD-10-CM

## 2016-05-04 MED ORDER — CARVEDILOL 12.5 MG PO TABS: 12.5000 mg | ORAL_TABLET | Freq: Two times a day (BID) | ORAL | Status: DC

## 2016-05-04 NOTE — Telephone Encounter (Signed)
Refill sent for coreg 3.125 mg one tablet twice a day.  

## 2016-05-05 ENCOUNTER — Other Ambulatory Visit: Payer: Medicaid Other

## 2016-05-08 ENCOUNTER — Telehealth: Payer: Self-pay | Admitting: Cardiovascular Disease

## 2016-05-08 ENCOUNTER — Other Ambulatory Visit (INDEPENDENT_AMBULATORY_CARE_PROVIDER_SITE_OTHER): Payer: Medicaid Other | Admitting: *Deleted

## 2016-05-08 DIAGNOSIS — I2510 Atherosclerotic heart disease of native coronary artery without angina pectoris: Secondary | ICD-10-CM

## 2016-05-08 DIAGNOSIS — I2583 Coronary atherosclerosis due to lipid rich plaque: Secondary | ICD-10-CM

## 2016-05-08 DIAGNOSIS — I10 Essential (primary) hypertension: Secondary | ICD-10-CM | POA: Diagnosis not present

## 2016-05-08 NOTE — Telephone Encounter (Signed)
valsartan (DIOVAN) 160 MG tablet  [employer] on [address] [phone])  is calling regarding prior auth. Its been approved but not going through. Need prior auth again. Thanks

## 2016-05-08 NOTE — Telephone Encounter (Signed)
Application for disability parking placard put in MD basket

## 2016-05-08 NOTE — Telephone Encounter (Signed)
Expand All Collapse All   Spoke with [facility] for prior approval for Diovan 160 mg which has been approved from 05-08-2016 to 05-03-2017. Pharmacist is aware of approval with PA # [id number].  Patient aware of approval as well.

## 2016-05-08 NOTE — Telephone Encounter (Signed)
Spoke with [facility] for prior approval for Diovan 160 mg which has been approved from 05-08-2016 to 05-03-2017. Pharmacist is aware of approval with PA # [id number].  Patient aware of approval as well.

## 2016-05-09 LAB — COMPREHENSIVE METABOLIC PANEL
A/G RATIO: 1.6 (ref 1.2–2.2)
ALT: 21 IU/L (ref 0–44)
AST: 18 IU/L (ref 0–40)
Albumin: 4.4 g/dL (ref 3.5–5.5)
Alkaline Phosphatase: 82 IU/L (ref 39–117)
BUN/Creatinine Ratio: 10 (ref 9–20)
BUN: 12 mg/dL (ref 6–24)
Bilirubin Total: 0.6 mg/dL (ref 0.0–1.2)
CALCIUM: 9.4 mg/dL (ref 8.7–10.2)
CO2: 23 mmol/L (ref 18–29)
Chloride: 101 mmol/L (ref 96–106)
Creatinine, Ser: 1.17 mg/dL (ref 0.76–1.27)
GFR calc Af Amer: 82 mL/min/{1.73_m2} (ref >59)
GFR, EST NON AFRICAN AMERICAN: 71 mL/min/{1.73_m2} (ref >59)
GLUCOSE: 153 mg/dL — AB (ref 65–99)
Globulin, Total: 2.8 g/dL (ref 1.5–4.5)
POTASSIUM: 4.4 mmol/L (ref 3.5–5.2)
Sodium: 143 mmol/L (ref 134–144)
Total Protein: 7.2 g/dL (ref 6.0–8.5)

## 2016-05-09 LAB — LIPID PANEL
CHOL/HDL RATIO: 3.2 ratio (ref 0.0–5.0)
Cholesterol, Total: 167 mg/dL (ref 100–199)
HDL: 52 mg/dL (ref >39)
LDL Calculated: 85 mg/dL (ref 0–99)
TRIGLYCERIDES: 149 mg/dL (ref 0–149)
VLDL Cholesterol Cal: 30 mg/dL (ref 5–40)

## 2016-05-12 NOTE — Telephone Encounter (Signed)
Spoke w/ pt.  Advised him that Dr. Fletcher Anon recommends that pt not have placard at this time, as he would like for him to be more active.  Pt states that he does not necessarily agree, but he does respect Dr. Tyrell Antonio opinion.  He is appreciative of the call and for Dr. Tyrell Antonio consideration.

## 2016-06-05 ENCOUNTER — Other Ambulatory Visit: Payer: Self-pay | Admitting: Cardiovascular Disease

## 2016-06-09 ENCOUNTER — Ambulatory Visit: Payer: Medicaid Other | Admitting: Family Medicine

## 2016-06-15 ENCOUNTER — Ambulatory Visit: Payer: Medicaid Other | Admitting: Cardiovascular Disease

## 2016-07-10 ENCOUNTER — Encounter: Payer: Self-pay | Admitting: Family Medicine

## 2016-07-10 ENCOUNTER — Ambulatory Visit (INDEPENDENT_AMBULATORY_CARE_PROVIDER_SITE_OTHER): Payer: Medicaid Other | Admitting: Family Medicine

## 2016-07-10 VITALS — BP 142/88 | HR 77 | Temp 97.6°F | Resp 16 | Wt 255.8 lb

## 2016-07-10 DIAGNOSIS — G4731 Primary central sleep apnea: Secondary | ICD-10-CM

## 2016-07-10 DIAGNOSIS — I10 Essential (primary) hypertension: Secondary | ICD-10-CM

## 2016-07-10 DIAGNOSIS — I209 Angina pectoris, unspecified: Secondary | ICD-10-CM

## 2016-07-10 DIAGNOSIS — G8929 Other chronic pain: Secondary | ICD-10-CM | POA: Diagnosis not present

## 2016-07-10 DIAGNOSIS — E785 Hyperlipidemia, unspecified: Secondary | ICD-10-CM

## 2016-07-10 DIAGNOSIS — M545 Low back pain, unspecified: Secondary | ICD-10-CM

## 2016-07-10 DIAGNOSIS — E1140 Type 2 diabetes mellitus with diabetic neuropathy, unspecified: Secondary | ICD-10-CM

## 2016-07-10 DIAGNOSIS — D751 Secondary polycythemia: Secondary | ICD-10-CM

## 2016-07-10 DIAGNOSIS — K219 Gastro-esophageal reflux disease without esophagitis: Secondary | ICD-10-CM | POA: Diagnosis not present

## 2016-07-10 LAB — POCT UA - MICROALBUMIN: MICROALBUMIN (UR) POC: 100 mg/L

## 2016-07-10 LAB — POCT GLYCOSYLATED HEMOGLOBIN (HGB A1C): Hemoglobin A1C: 5.6

## 2016-07-10 MED ORDER — ATORVASTATIN CALCIUM 80 MG PO TABS: 80.0000 mg | ORAL_TABLET | Freq: Every day | ORAL | Status: DC

## 2016-07-10 MED ORDER — METFORMIN HCL 850 MG PO TABS: 850.0000 mg | ORAL_TABLET | Freq: Two times a day (BID) | ORAL | Status: DC

## 2016-07-10 MED ORDER — OMEPRAZOLE 20 MG PO CPDR: 20.0000 mg | DELAYED_RELEASE_CAPSULE | Freq: Every morning | ORAL | Status: DC

## 2016-07-10 MED ORDER — VALSARTAN 320 MG PO TABS: 320.0000 mg | ORAL_TABLET | Freq: Every day | ORAL | Status: DC

## 2016-07-10 NOTE — Progress Notes (Signed)
Name: [name] [name]   MRN: [id number]    DOB: 06/14/1961   Date:07/10/2016       Progress Note  Subjective  Chief Complaint  Chief Complaint  Patient presents with  . Medication Refill  . Hypertension  . Hyperlipidemia  . Apnea  . Diabetes  . Coronary Artery Disease  . Back Pain  . Neck Pain  . Memory Loss    patient stated that he has been having some issues with remembering.  . 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 Taking Metformin and hgbA1C is well controlled. Diagnosed in 07/2014. He is following a diabetic diet. Denies polyphagia, polydipsia and polyuria. Urine micro negative. He is due for an eye exam.   HTN: doing well, he is compliant with medication, we will adjust dose of Diovan to 320 mg daily , he is also on Carvedilol denies side effects of medication  Hyperlipidemia: taking Atorvastatin, denies myalgia.  GERD: under control with medication No heartburn or regurgitation  CAD: he has chest pain seldom, usually just with strenuos activity, unless moderate activity - sees cardiologist - Dr. Fletcher Anon - on Ranexa on statin, aspirin and beta-blocker. Dr. Fletcher Anon knows that he is taking Sildenafil.   Chronic neck pain with radiculitis: having pain daily, using patches, seeing neurosurgeon at [facility], he was going to the pain clinic, he does no want to get steroid injections at this time. He states he has paresthesia daily. He would like a second opinion - we made appointment but he never got a phone call from them  Chronic Low back pain: using Flector patchers daily and is controlling pain, also uses ice pack. He stopped going to pain clinic - Dr. Levora Angel, because of the distance.   OSA: he wears CPAP every night, but still feels tired during the day if  he does not take Nuvigil . 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 He will return for MMS test within the next month  Hyperlipidemia: reviewed labs done in May and has improved, no myalgia  Urine odor: very concentrated, no dysuria or hematuria.  Patient Active Problem List   Diagnosis Date Noted  . Coronary artery disease involving native coronary artery of native heart   . Radiculitis of left cervical region 03/05/2016  . Diabetic neuropathy associated with type 2 diabetes mellitus ([place]) 12/05/2015  . Patellar subluxation 06/05/2015  . Allergic rhinitis, seasonal 06/05/2015  . Chronic constipation 06/05/2015  . Chronic LBP 06/05/2015  . Morbid obesity due to excess calories ([place]) 06/05/2015  . Vitamin D deficiency 06/05/2015  . Central sleep apnea 06/05/2015  . 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 Nerve root pain 06/05/2015  . Acquired polycythemia 06/05/2015  . Anterior knee pain 06/05/2015  . Dysmetabolic syndrome [id number]/6967  . Failure of erection 06/05/2015  . Gastro-esophageal reflux disease without esophagitis 06/05/2015  . Neuropathy ([place]) 06/05/2015  . CAD (coronary artery disease)   . Angina, class III ([place]) 05/23/2015  . Essential hypertension 05/23/2015  . Hyperlipidemia 05/23/2015  . Inguinal hernia without mention of obstruction or gangrene, recurrent unilateral or unspecified 03/24/2013    Past Surgical History  Procedure Laterality Date  .  Back surgery  12/2008  . Colonoscopy  2014    Dr. [name]  . Hernia repair  1991  . Inguinal hernia repair Right 2012  . Inguinal hernia repair Right 2014  . Cardiac catheterization N/A 05/30/2015    Procedure: Left Heart Cath;  Surgeon: Wellington Hampshire, MD;  Location: [facility];  Service: Cardiovascular;  Laterality: N/A;  . Cardiac catheterization N/A 04/16/2016    Procedure: Left Heart Cath and Coronary Angiography;  Surgeon: Wellington Hampshire, MD;  Location: [facility];  Service: Cardiovascular;  Laterality: N/A;    Family History  Problem Relation Age of Onset  . Heart Problems Mother   . Heart Problems Father 55    myocardial infarction  . Mental illness Brother     Social History   Social History  . Marital Status: Single    Spouse Name: N/A  . Number of Children: N/A  . Years of Education: N/A   Occupational History  . Not on file.   Social History Main Topics  . Smoking status: Former Smoker -- 2 years    Types: Cigars    Start date: 12/28/2002    Quit date: 06/04/2005  . Smokeless tobacco: Never Used  . Alcohol Use: 0.6 oz/week    1 Glasses of wine per week  . Drug Use: No  . Sexual Activity:    Partners: Female   Other Topics Concern  . Not on file   Social History Narrative     Current outpatient prescriptions:  .  Armodafinil (NUVIGIL) 150 MG tablet, Take 1 tablet (150 mg total) by mouth daily., Disp: 30 tablet, Rfl: 2 .  aspirin 81 MG tablet, Take 81 mg by mouth daily., Disp: , Rfl:  .  atorvastatin (LIPITOR) 80 MG tablet, Take 1 tablet (80 mg total) by mouth daily., Disp: 30 tablet, Rfl: 5 .  carvedilol (COREG) 12.5 MG tablet, Take 1 tablet (12.5 mg total) by mouth 2 (two) times daily., Disp: 60 tablet, Rfl: 3 .  cetirizine (ZYRTEC) 10 MG tablet, , Disp: , Rfl: 1 .  FLECTOR 1.3 % PTCH, Place 1 patch onto the skin 2 (two) times daily., Disp: 60 patch, Rfl: 5 .  fluticasone (FLONASE) 50 MCG/ACT nasal spray, Place 2 sprays into the nose as needed., Disp: , Rfl:  .  loratadine (CLARITIN) 10 MG tablet, Take 10 mg by mouth as needed. , Disp: , Rfl:  .  metFORMIN (GLUCOPHAGE) 850 MG tablet, Take 1 tablet (850 mg total) by mouth 2 (two) times daily with a meal., Disp: 60 tablet, Rfl: 5 .  nitroGLYCERIN (NITROSTAT) 0.4 MG SL tablet, Place 1 tablet (0.4 mg total) under the tongue every 5 (five) minutes as needed for chest pain., Disp: 15 tablet, Rfl: 1 .  omeprazole (PRILOSEC) 20 MG capsule, Take 1 capsule (20 mg total) by  mouth every morning., Disp: 30 capsule, Rfl: 5 .  polyethylene glycol powder (GLYCOLAX/MIRALAX) powder, Take 8 g by mouth as needed. For constipation, Disp: , Rfl: 0 .  RANEXA 1000 MG SR tablet, take 1 tablet by mouth twice a day, Disp: 60 tablet, Rfl: 5 .  sildenafil (REVATIO) 20 MG tablet, take 1 to 2 tablets by mouth once daily if needed, Disp: 60 tablet, Rfl: 2 .  valsartan (DIOVAN) 320 MG tablet, Take 1 tablet (320 mg total) by mouth daily., Disp: 30 tablet, Rfl: 2  Allergies  Allergen Reactions  . Gabapentin     Groggy-Mood Changes  .  Latex   . Penicillins Hives and Rash     ROS  Constitutional: Negative for fever , positive for  weight change.  Respiratory: Negative for cough and shortness of breath.   Cardiovascular: Negative for chest pain or palpitations.  Gastrointestinal: Negative for abdominal pain, no bowel changes.  Musculoskeletal: Negative for gait problem or joint swelling.  Skin: Negative for rash.  Neurological: Negative for dizziness or headache.  No other specific complaints in a complete review of systems (except as listed in HPI above).  Objective  Filed Vitals:   07/10/16 0956 07/10/16 0957  BP: 142/88 142/88  Pulse: 77   Temp: 97.6 F (36.4 C)   TempSrc: Oral   Resp: 16   Weight: 255 lb 12.8 oz (116.03 kg)   SpO2: 96%     Body mass index is 35.69 kg/(m^2).  Physical Exam  Constitutional: Patient appears well-developed and well-nourished. Obese  No distress.  HEENT: head atraumatic, normocephalic, pupils equal and reactive to light,  neck supple, throat within normal limits Cardiovascular: Normal rate, regular rhythm and normal heart sounds.  No murmur heard. No BLE edema. Pulmonary/Chest: Effort normal and breath sounds normal. No respiratory distress. Abdominal: Soft.  There is no tenderness. Psychiatric: Patient has a normal mood and affect. behavior is normal. Judgment and thought content normal.  Recent Results (from the past 2160  hour(s))  Basic Metabolic Panel (BMET)     Status: Abnormal   Collection Time: 04/14/16  9:22 AM  Result Value Ref Range   Sodium 139 135 - 145 mmol/L   Potassium 4.0 3.5 - 5.1 mmol/L   Chloride 107 101 - 111 mmol/L   CO2 25 22 - 32 mmol/L   Glucose, Bld 144 (H) 65 - 99 mg/dL   BUN 14 6 - 20 mg/dL   Creatinine, Ser 1.03 0.61 - 1.24 mg/dL   Calcium 8.9 8.9 - 10.3 mg/dL   GFR calc non Af Amer >60 >60 mL/min   GFR calc Af Amer >60 >60 mL/min    Comment: (NOTE) The eGFR has been calculated using the CKD EPI equation. This calculation has not been validated in all clinical situations. eGFR's persistently <60 mL/min signify possible Chronic Kidney Disease.    Anion gap 7 5 - 15  CBC     Status: None   Collection Time: 04/14/16  9:22 AM  Result Value Ref Range   WBC 7.7 3.8 - 10.6 K/uL   RBC 4.79 4.40 - 5.90 MIL/uL   Hemoglobin 15.2 13.0 - 18.0 g/dL   HCT 44.7 40.0 - 52.0 %   MCV 93.4 80.0 - 100.0 fL   MCH 31.8 26.0 - 34.0 pg   MCHC 34.0 32.0 - 36.0 g/dL   RDW 14.3 11.5 - 14.5 %   Platelets 210 150 - 440 K/uL  INR/PT     Status: None   Collection Time: 04/14/16  9:22 AM  Result Value Ref Range   Prothrombin Time 13.6 11.4 - 15.0 seconds   INR 1.02   Comp Met (CMET)     Status: Abnormal   Collection Time: 05/08/16  8:03 AM  Result Value Ref Range   Glucose 153 (H) 65 - 99 mg/dL   BUN 12 6 - 24 mg/dL   Creatinine, Ser 1.17 0.76 - 1.27 mg/dL   GFR calc non Af Amer 71 >59 mL/min/1.73   GFR calc Af Amer 82 >59 mL/min/1.73   BUN/Creatinine Ratio 10 9 - 20   Sodium 143 134 - 144 mmol/L  Potassium 4.4 3.5 - 5.2 mmol/L   Chloride 101 96 - 106 mmol/L   CO2 23 18 - 29 mmol/L   Calcium 9.4 8.7 - 10.2 mg/dL   Total Protein 7.2 6.0 - 8.5 g/dL   Albumin 4.4 3.5 - 5.5 g/dL   Globulin, Total 2.8 1.5 - 4.5 g/dL   Albumin/Globulin Ratio 1.6 1.2 - 2.2   Bilirubin Total 0.6 0.0 - 1.2 mg/dL   Alkaline Phosphatase 82 39 - 117 IU/L   AST 18 0 - 40 IU/L   ALT 21 0 - 44 IU/L  Lipid  Profile     Status: None   Collection Time: 05/08/16  8:03 AM  Result Value Ref Range   Cholesterol, Total 167 100 - 199 mg/dL   Triglycerides 149 0 - 149 mg/dL   HDL 52 >39 mg/dL   VLDL Cholesterol Cal 30 5 - 40 mg/dL   LDL Calculated 85 0 - 99 mg/dL   Chol/HDL Ratio 3.2 0.0 - 5.0 ratio units    Comment:                                   T. Chol/HDL Ratio                                             Men  Women                               1/2 Avg.Risk  3.4    3.3                                   Avg.Risk  5.0    4.4                                2X Avg.Risk  9.6    7.1                                3X Avg.Risk 23.4   11.0   POCT glycosylated hemoglobin (Hb A1C)     Status: Normal   Collection Time: 07/10/16 10:14 AM  Result Value Ref Range   Hemoglobin A1C 5.6   POCT UA - Microalbumin     Status: Abnormal   Collection Time: 07/10/16 10:14 AM  Result Value Ref Range   Microalbumin Ur, POC 100 mg/L   Creatinine, POC  mg/dL   Albumin/Creatinine Ratio, Urine, POC      Diabetic Foot Exam: Diabetic Foot Exam - Simple   Simple Foot Form  Diabetic Foot exam was performed with the following findings:  Yes 07/10/2016 10:32 AM  Visual Inspection  See comments:  Yes  Sensation Testing  Intact to touch and monofilament testing bilaterally:  Yes  Pulse Check  Posterior Tibialis and Dorsalis pulse intact bilaterally:  Yes  Comments  Thick toe nails       PHQ2/9: Depression screen [facility] 2/9 07/10/2016 03/05/2016 12/05/2015 09/05/2015 06/05/2015  Decreased Interest 0 0 0 0 0  Down, Depressed, Hopeless 0 0 0 0 0  PHQ - 2 Score  0 0 0 0 0    Fall Risk: Fall Risk  07/10/2016 03/05/2016 12/05/2015 09/05/2015 06/05/2015  Falls in the past year? No Yes Yes No No  Number falls in past yr: - 2 or more 2 or more - -  Injury with Fall? - Yes Yes - -      Functional Status Survey: Is the patient deaf or have difficulty hearing?: No Does the patient have difficulty seeing, even when wearing  glasses/contacts?: No Does the patient have difficulty concentrating, remembering, or making decisions?: Yes Does the patient have difficulty walking or climbing stairs?: No Does the patient have difficulty dressing or bathing?: No Does the patient have difficulty doing errands alone such as visiting a doctor's office or shopping?: No    Assessment & Plan  1. Type 2 diabetes mellitus with diabetic neuropathy, without long-term current use of insulin ([place])  At goal, continue medication and good work on weight loss.  - POCT glycosylated hemoglobin (Hb A1C) - POCT UA - Microalbumin - valsartan (DIOVAN) 320 MG tablet; Take 1 tablet (320 mg total) by mouth daily.  Dispense: 30 tablet; Refill: 2 - metFORMIN (GLUCOPHAGE) 850 MG tablet; Take 1 tablet (850 mg total) by mouth 2 (two) times daily with a meal.  Dispense: 60 tablet; Refill: 5  2. Essential hypertension  - valsartan (DIOVAN) 320 MG tablet; Take 1 tablet (320 mg total) by mouth daily.  Dispense: 30 tablet; Refill: 2  3. Hyperlipidemia  - atorvastatin (LIPITOR) 80 MG tablet; Take 1 tablet (80 mg total) by mouth daily.  Dispense: 30 tablet; Refill: 5  4. Chronic LBP  We will verify the referral made last visit  5. Central sleep apnea  We will verify what happened to CPAP, he needs a new mask, machine is not working   6. Morbid obesity due to excess calories ([place])  Doing well, losing weight  7. Angina, class III ([place])  On medical management, still taking NTG about once a week after or during activity  8. Acquired polycythemia  Continue follow up with hematologist   9. Gastro-esophageal reflux disease without esophagitis  - omeprazole (PRILOSEC) 20 MG capsule; Take 1 capsule (20 mg total) by mouth every morning.  Dispense: 30 capsule; Refill: 5

## 2016-08-18 ENCOUNTER — Other Ambulatory Visit: Payer: Self-pay | Admitting: Family Medicine

## 2016-08-18 NOTE — Telephone Encounter (Signed)
Patient requesting refill of Sildenafil be sent to [facility].

## 2016-08-21 ENCOUNTER — Ambulatory Visit (INDEPENDENT_AMBULATORY_CARE_PROVIDER_SITE_OTHER): Payer: Medicaid Other | Admitting: Family Medicine

## 2016-08-24 ENCOUNTER — Ambulatory Visit: Payer: Medicaid Other | Admitting: Cardiovascular Disease

## 2016-09-15 ENCOUNTER — Ambulatory Visit (INDEPENDENT_AMBULATORY_CARE_PROVIDER_SITE_OTHER): Payer: Medicaid Other | Admitting: Cardiovascular Disease

## 2016-09-15 ENCOUNTER — Encounter: Payer: Self-pay | Admitting: Cardiovascular Disease

## 2016-09-15 VITALS — BP 146/94 | HR 62 | Ht 71.0 in | Wt 262.8 lb

## 2016-09-15 DIAGNOSIS — E785 Hyperlipidemia, unspecified: Secondary | ICD-10-CM

## 2016-09-15 DIAGNOSIS — I25118 Atherosclerotic heart disease of native coronary artery with other forms of angina pectoris: Secondary | ICD-10-CM

## 2016-09-15 DIAGNOSIS — I2510 Atherosclerotic heart disease of native coronary artery without angina pectoris: Secondary | ICD-10-CM

## 2016-09-15 DIAGNOSIS — I10 Essential (primary) hypertension: Secondary | ICD-10-CM

## 2016-09-15 NOTE — Progress Notes (Signed)
Cardiology Office Note   Date:  09/15/2016   ID:  [name] [name], DOB Dec 31, 1960, MRN [id number]  PCP:  [name], MD  Cardiologist:   [name], MD   Chief Complaint  Patient presents with  . Coronary Artery Disease      History of Present Illness: [name] [name] is a 55 y.o. male who presents for A follow-up visit regarding mild -moderate one-vessel coronary artery disease with stable angina and suspected endothelial dysfunction. He has known history of type 2 diabetes, hypertension, hyperlipidemia and obesity.He is a previous smoker. Cardiac catheterization in June 2016 showed moderate mid LAD stenosis (50%) with an FFR ratio of 0.83 . LV systolic function with normal with normal left ventricular end-diastolic pressure. He had worsening angina in April 2017. Repeat cardiac catheterization showed improvement in mid LAD stenosis to 30% and no evidence of obstructive coronary artery disease. The patient has been treated medically with Ranexa with subsequent improvement in symptoms. He has been doing well overall but has been under stress lately after he had 4 deaths in his family due to multiple reasons. He has been taking his medications regularly. He hasn't had to use sublingual nitroglycerin in a while.   Past Medical History:  Diagnosis Date  . Allergy   . CAD (coronary artery disease)    a. cardiac cath 05/30/2015: LM nl, mLAD 50%, LCx minor irregs, RCA minor irregs, EF 55-65%, no WMA, no MR/AS, nl LVEDP, recommend aggressive med Rx  . Degeneration of intervertebral disc of cervical region   . Diabetes mellitus without complication ([place])   . Hernia 1991   02/10/2012-RIH repair  . Hyperlipidemia   . Hypertension 2008  . Inguinal hernia without mention of obstruction or gangrene, recurrent unilateral or unspecified 2012  . Obesity, unspecified 2012  . Personal history of tobacco use, presenting hazards to health 2012  . Sleep apnea   . Umbilical hernia without  mention of obstruction or gangrene 2012    Past Surgical History:  Procedure Laterality Date  . BACK SURGERY  12/2008  . CARDIAC CATHETERIZATION N/A 05/30/2015   Procedure: Left Heart Cath;  Surgeon: Wellington Hampshire, MD;  Location: [facility];  Service: Cardiovascular;  Laterality: N/A;  . CARDIAC CATHETERIZATION N/A 04/16/2016   Procedure: Left Heart Cath and Coronary Angiography;  Surgeon: Wellington Hampshire, MD;  Location: [facility];  Service: Cardiovascular;  Laterality: N/A;  . COLONOSCOPY  2014   Dr. [name]  . HERNIA REPAIR  1991  . INGUINAL HERNIA REPAIR Right 2012  . INGUINAL HERNIA REPAIR Right 2014     Current Outpatient Prescriptions  Medication Sig Dispense Refill  . Armodafinil (NUVIGIL) 150 MG tablet Take 1 tablet (150 mg total) by mouth daily. 30 tablet 2  . aspirin 81 MG tablet Take 81 mg by mouth daily.    Marland Kitchen atorvastatin (LIPITOR) 80 MG tablet Take 1 tablet (80 mg total) by mouth daily. 30 tablet 5  . carvedilol (COREG) 12.5 MG tablet Take 1 tablet (12.5 mg total) by mouth 2 (two) times daily. 60 tablet 3  . cetirizine (ZYRTEC) 10 MG tablet Take 10 mg by mouth daily.   1  . FLECTOR 1.3 % PTCH Place 1 patch onto the skin 2 (two) times daily. (Patient taking differently: Place 1 patch onto the skin 2 (two) times daily as needed (pain). ) 60 patch 5  . fluticasone (FLONASE) 50 MCG/ACT nasal spray Place 2 sprays into the nose  as needed.    . loratadine (CLARITIN) 10 MG tablet Take 10 mg by mouth as needed.     . metFORMIN (GLUCOPHAGE) 850 MG tablet Take 1 tablet (850 mg total) by mouth 2 (two) times daily with a meal. 60 tablet 5  . nitroGLYCERIN (NITROSTAT) 0.4 MG SL tablet Place 1 tablet (0.4 mg total) under the tongue every 5 (five) minutes as needed for chest pain. 15 tablet 1  . omeprazole (PRILOSEC) 20 MG capsule Take 1 capsule (20 mg total) by mouth every morning. 30 capsule 5  . polyethylene glycol powder (GLYCOLAX/MIRALAX) powder Take 8 g by mouth  as needed. For constipation  0  . RANEXA 1000 MG SR tablet take 1 tablet by mouth twice a day 60 tablet 5  . sildenafil (REVATIO) 20 MG tablet TAKE 1 TO 2 TABLETS BY MOUTH ONCE DAILY IF NEEDED 60 tablet 2  . valsartan (DIOVAN) 320 MG tablet Take 1 tablet (320 mg total) by mouth daily. 30 tablet 2   No current facility-administered medications for this visit.     Allergies:   Gabapentin; Latex; and Penicillins    Social History:  The patient  reports that he quit smoking about 11 years ago. His smoking use included Cigars. He started smoking about 13 years ago. He quit after 2.00 years of use. He has never used smokeless tobacco. He reports that he drinks about 0.6 oz of alcohol per week . He reports that he does not use drugs.   Family History:  The patient's family history includes Heart Problems in his mother; Heart Problems (age of onset: 42) in his father; Mental illness in his brother.    ROS:  Please see the history of present illness.   Otherwise, review of systems are positive for none.   All other systems are reviewed and negative.    PHYSICAL EXAM: VS:  BP (!) 146/94 (BP Location: Right Arm, Patient Position: Sitting, Cuff Size: Large)   Pulse 62   Ht 5\' 11"  (1.803 m)   Wt 262 lb 12.8 oz (119.2 kg)   SpO2 93%   BMI 36.65 kg/m  , BMI Body mass index is 36.65 kg/m. GEN: Well nourished, well developed, in no acute distress  HEENT: normal  Neck: no JVD, carotid bruits, or masses Cardiac: RRR; no murmurs, rubs, or gallops,no edema  Respiratory:  clear to auscultation bilaterally, normal work of breathing GI: soft, nontender, nondistended, + BS MS: no deformity or atrophy  Skin: warm and dry, no rash Neuro:  Strength and sensation are intact Psych: euthymic mood, full affect   EKG:  EKG is not ordered today.    Recent Labs: 04/14/2016: Hemoglobin 15.2; Platelets 210 05/08/2016: ALT 21; BUN 12; Creatinine, Ser 1.17; Potassium 4.4; Sodium 143    Lipid Panel      Component Value Date/Time   CHOL 167 05/08/2016 0803   TRIG 149 05/08/2016 0803   HDL 52 05/08/2016 0803   CHOLHDL 3.2 05/08/2016 0803   LDLCALC 85 05/08/2016 0803      Wt Readings from Last 3 Encounters:  09/15/16 262 lb 12.8 oz (119.2 kg)  07/10/16 255 lb 12.8 oz (116 kg)  04/28/16 268 lb (121.6 kg)        ASSESSMENT AND PLAN:  1.  Coronary artery disease involving native coronary arteries with other forms of angina:  The patient has suspected endothelial dysfunction. Cardiac catheterization twice showed no evidence of obstructive coronary artery disease. Continue medical therapy. I had a discussion  with him about the importance of lifestyle changes, regular exercise and weight loss.  2. Hyperlipidemia:  Continue treatment with high dose atorvastatin. Most recent LDL was 85.  3. Essential hypertension: Blood pressure is elevated today but he has been under stress due to multiple deaths in his family. Continue to monitor for now.    Disposition:   FU with me in 6 months  Signed,  [name], MD  09/15/2016 3:16 PM    [place]

## 2016-09-15 NOTE — Patient Instructions (Signed)
Medication Instructions: Continue same medications.   Labwork: None.   Procedures/Testing: None.   Follow-Up: 6 months with Dr. Arida.   Any Additional Special Instructions Will Be Listed Below (If Applicable).     If you need a refill on your cardiac medications before your next appointment, please call your pharmacy.   

## 2016-09-25 ENCOUNTER — Ambulatory Visit (INDEPENDENT_AMBULATORY_CARE_PROVIDER_SITE_OTHER): Payer: Medicaid Other | Admitting: Family Medicine

## 2016-09-25 ENCOUNTER — Encounter: Payer: Self-pay | Admitting: Family Medicine

## 2016-09-25 VITALS — BP 124/86 | HR 66 | Temp 98.2°F | Resp 16 | Ht 71.0 in | Wt 255.4 lb

## 2016-09-25 DIAGNOSIS — Z23 Encounter for immunization: Secondary | ICD-10-CM | POA: Diagnosis not present

## 2016-09-25 DIAGNOSIS — G4731 Primary central sleep apnea: Secondary | ICD-10-CM

## 2016-09-25 DIAGNOSIS — R413 Other amnesia: Secondary | ICD-10-CM

## 2016-09-25 NOTE — Progress Notes (Signed)
Name: [name] [name]   MRN: [id number]    DOB: 18-Aug-1961   Date:09/25/2016       Progress Note  Subjective  Chief Complaint  Chief Complaint  Patient presents with  . Follow-up  . Sleep Apnea    Patient states he did recieve a new nasal mask and doing well with it. Except he is getting a headache with it. Patient states pressure is suppose to be at 15 but stays at 4.    HPI  Memory Changes: he states he has been more forgetful. He states he forgets where he parks his car also forgets what car he drives to the store at times. He took a short cut while driving and forgot where he was going. Mother and girlfriend are worried because of his forgetfulness, frequently losing keys, looking for his phone while his phone is in his pocket, he has also been repeating the same stories in a short time frame.  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   OSA: he has been compliant with CPAP machine on auto titration, he is waking up with a headache, but resolves by itself. He takes Nuvigil only prn.    Patient Active Problem List   Diagnosis Date Noted  . Coronary artery disease involving native coronary artery of native heart   . Radiculitis of left cervical region 03/05/2016  . Diabetic neuropathy associated with type 2 diabetes mellitus ([place]) 12/05/2015  . Patellar subluxation 06/05/2015  . Allergic rhinitis, seasonal 06/05/2015  . Chronic constipation 06/05/2015  . Chronic LBP 06/05/2015  . Morbid obesity due to excess calories ([place]) 06/05/2015  . Vitamin D deficiency 06/05/2015  . Central sleep apnea 06/05/2015  . 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 Nerve root pain 06/05/2015  . Acquired polycythemia 06/05/2015  . Anterior knee pain 06/05/2015  . Dysmetabolic syndrome 99991111  . Failure of erection 06/05/2015  . Gastro-esophageal reflux disease without esophagitis 06/05/2015  . Neuropathy ([place]) 06/05/2015  . CAD (coronary artery disease)   . Angina, class III ([place])  05/23/2015  . Essential hypertension 05/23/2015  . Hyperlipidemia 05/23/2015  . Inguinal hernia without mention of obstruction or gangrene, recurrent unilateral or unspecified 03/24/2013    Past Surgical History:  Procedure Laterality Date  . BACK SURGERY  12/2008  . CARDIAC CATHETERIZATION N/A 05/30/2015   Procedure: Left Heart Cath;  Surgeon: Wellington Hampshire, MD;  Location: [facility];  Service: Cardiovascular;  Laterality: N/A;  . CARDIAC CATHETERIZATION N/A 04/16/2016   Procedure: Left Heart Cath and Coronary Angiography;  Surgeon: Wellington Hampshire, MD;  Location: [facility];  Service: Cardiovascular;  Laterality: N/A;  . COLONOSCOPY  2014   Dr. [name]  . HERNIA REPAIR  1991  . INGUINAL HERNIA REPAIR Right 2012  . INGUINAL HERNIA REPAIR Right 2014    Family History  Problem Relation Age of Onset  . Heart Problems Mother   . Heart Problems Father 32    myocardial infarction  . Mental illness Brother     Social History   Social History  . Marital status: Single    Spouse name: N/A  . Number of children: N/A  . Years of education: N/A   Occupational History  . Not on file.   Social History Main Topics  . Smoking status: Former Smoker    Years: 2.00    Types: Cigars    Start date: 12/28/2002    Quit date: 06/04/2005  . Smokeless tobacco:  Never Used  . Alcohol use 0.6 oz/week    1 Glasses of wine per week  . Drug use: No  . Sexual activity: Yes    Partners: Female   Other Topics Concern  . Not on file   Social History Narrative  . No narrative on file     Current Outpatient Prescriptions:  .  Armodafinil (NUVIGIL) 150 MG tablet, Take 1 tablet (150 mg total) by mouth daily., Disp: 30 tablet, Rfl: 2 .  aspirin 81 MG tablet, Take 81 mg by mouth daily., Disp: , Rfl:  .  atorvastatin (LIPITOR) 80 MG tablet, Take 1 tablet (80 mg total) by mouth daily., Disp: 30 tablet, Rfl: 5 .  carvedilol (COREG) 12.5 MG tablet, Take 1 tablet (12.5 mg total) by  mouth 2 (two) times daily., Disp: 60 tablet, Rfl: 3 .  cetirizine (ZYRTEC) 10 MG tablet, Take 10 mg by mouth daily. , Disp: , Rfl: 1 .  FLECTOR 1.3 % PTCH, Place 1 patch onto the skin 2 (two) times daily. (Patient taking differently: Place 1 patch onto the skin 2 (two) times daily as needed (pain). ), Disp: 60 patch, Rfl: 5 .  fluticasone (FLONASE) 50 MCG/ACT nasal spray, Place 2 sprays into the nose as needed., Disp: , Rfl:  .  loratadine (CLARITIN) 10 MG tablet, Take 10 mg by mouth as needed. , Disp: , Rfl:  .  metFORMIN (GLUCOPHAGE) 850 MG tablet, Take 1 tablet (850 mg total) by mouth 2 (two) times daily with a meal., Disp: 60 tablet, Rfl: 5 .  nitroGLYCERIN (NITROSTAT) 0.4 MG SL tablet, Place 1 tablet (0.4 mg total) under the tongue every 5 (five) minutes as needed for chest pain., Disp: 15 tablet, Rfl: 1 .  omeprazole (PRILOSEC) 20 MG capsule, Take 1 capsule (20 mg total) by mouth every morning., Disp: 30 capsule, Rfl: 5 .  polyethylene glycol powder (GLYCOLAX/MIRALAX) powder, Take 8 g by mouth as needed. For constipation, Disp: , Rfl: 0 .  RANEXA 1000 MG SR tablet, take 1 tablet by mouth twice a day, Disp: 60 tablet, Rfl: 5 .  sildenafil (REVATIO) 20 MG tablet, TAKE 1 TO 2 TABLETS BY MOUTH ONCE DAILY IF NEEDED, Disp: 60 tablet, Rfl: 2 .  valsartan (DIOVAN) 160 MG tablet, Take 1 tablet by mouth 2 (two) times daily with a meal., Disp: , Rfl: 0  Allergies  Allergen Reactions  . Gabapentin     Groggy-Mood Changes  . Latex   . Penicillins Hives and Rash     ROS  Constitutional: Negative for fever or weight change.  Respiratory: Negative for cough and shortness of breath.   Cardiovascular: Negative for chest pain or palpitations.  Gastrointestinal: Negative for abdominal pain, no bowel changes.  Musculoskeletal: Negative for gait problem or joint swelling.  Skin: Negative for rash.  Neurological: Negative for dizziness, positive headache.  No other specific complaints in a complete  review of systems (except as listed in HPI above).  Objective  Vitals:   09/25/16 0856  BP: 124/86  Pulse: 66  Resp: 16  Temp: 98.2 F (36.8 C)  TempSrc: Oral  SpO2: 95%  Weight: 255 lb 6.4 oz (115.8 kg)  Height: 5\' 11"  (1.803 m)    Body mass index is 35.62 kg/m.  Physical Exam  Constitutional: Patient appears well-developed and well-nourished. Obese  No distress.  HEENT: head atraumatic, normocephalic, pupils equal and reactive to light,  neck supple, throat within normal limits Cardiovascular: Normal rate, regular rhythm and normal heart  sounds.  No murmur heard. No BLE edema. Pulmonary/Chest: Effort normal and breath sounds normal. No respiratory distress. Abdominal: Soft.  There is no tenderness. Psychiatric: Patient has a normal mood and affect. behavior is normal. Judgment and thought content normal.  Recent Results (from the past 2160 hour(s))  POCT glycosylated hemoglobin (Hb A1C)     Status: Normal   Collection Time: 07/10/16 10:14 AM  Result Value Ref Range   Hemoglobin A1C 5.6   POCT UA - Microalbumin     Status: Abnormal   Collection Time: 07/10/16 10:14 AM  Result Value Ref Range   Microalbumin Ur, POC 100 mg/L   Creatinine, POC  mg/dL   Albumin/Creatinine Ratio, Urine, POC        PHQ2/9: Depression screen [facility] 2/9 07/10/2016 03/05/2016 12/05/2015 09/05/2015 06/05/2015  Decreased Interest 0 0 0 0 0  Down, Depressed, Hopeless 0 0 0 0 0  PHQ - 2 Score 0 0 0 0 0     Fall Risk: Fall Risk  07/10/2016 03/05/2016 12/05/2015 09/05/2015 06/05/2015  Falls in the past year? No Yes Yes No No  Number falls in past yr: - 2 or more 2 or more - -  Injury with Fall? - Yes Yes - -      Assessment & Plan  1. Memory changes  6 CIT was 7, however some worrisome symptoms of memory loss, I will refer him to  Neurologist  - Ambulatory referral to Neurology  2. Central sleep apnea  Continue CPAP, not sure why he has a morning headache, advised to make sure he has enough sleep  and will see neurologist  - Ambulatory referral to Neurology  3. Needs flu shot  - Flu Vaccine QUAD 36+ mos IM  4. Need for pneumococcal vaccination  - Pneumococcal conjugate vaccine 13-valent IM

## 2016-09-29 ENCOUNTER — Telehealth: Payer: Self-pay | Admitting: Family Medicine

## 2016-09-29 NOTE — Telephone Encounter (Signed)
Rollene Fare from [facility] states that she received a referral for the patient 6 months ago. She states that they tried contacting our office requesting additional information (demographic, insurance card, referral form). If patient is still needing this appointment she will need new referral as of now this old referral is inactive.  (p) [phone] (f) [phone] ATTN: Rollene Fare

## 2016-11-23 ENCOUNTER — Other Ambulatory Visit: Payer: Self-pay | Admitting: Family Medicine

## 2016-11-23 DIAGNOSIS — G4731 Primary central sleep apnea: Secondary | ICD-10-CM

## 2016-11-23 NOTE — Telephone Encounter (Signed)
Patient requesting refill of Armodafinil and Sildenafil.

## 2016-11-26 ENCOUNTER — Ambulatory Visit (INDEPENDENT_AMBULATORY_CARE_PROVIDER_SITE_OTHER): Payer: Medicaid Other | Admitting: Family Medicine

## 2016-11-26 ENCOUNTER — Encounter: Payer: Self-pay | Admitting: Family Medicine

## 2016-11-26 VITALS — BP 122/84 | HR 68 | Temp 98.1°F | Resp 16 | Ht 71.0 in | Wt 253.5 lb

## 2016-11-26 DIAGNOSIS — G4731 Primary central sleep apnea: Secondary | ICD-10-CM

## 2016-11-26 DIAGNOSIS — R109 Unspecified abdominal pain: Secondary | ICD-10-CM

## 2016-11-26 DIAGNOSIS — E1140 Type 2 diabetes mellitus with diabetic neuropathy, unspecified: Secondary | ICD-10-CM | POA: Diagnosis not present

## 2016-11-26 DIAGNOSIS — G8929 Other chronic pain: Secondary | ICD-10-CM

## 2016-11-26 DIAGNOSIS — M545 Low back pain, unspecified: Secondary | ICD-10-CM

## 2016-11-26 DIAGNOSIS — R194 Change in bowel habit: Secondary | ICD-10-CM

## 2016-11-26 DIAGNOSIS — E1129 Type 2 diabetes mellitus with other diabetic kidney complication: Secondary | ICD-10-CM

## 2016-11-26 DIAGNOSIS — K921 Melena: Secondary | ICD-10-CM | POA: Diagnosis not present

## 2016-11-26 DIAGNOSIS — I209 Angina pectoris, unspecified: Secondary | ICD-10-CM | POA: Diagnosis not present

## 2016-11-26 DIAGNOSIS — I10 Essential (primary) hypertension: Secondary | ICD-10-CM | POA: Diagnosis not present

## 2016-11-26 DIAGNOSIS — K219 Gastro-esophageal reflux disease without esophagitis: Secondary | ICD-10-CM | POA: Diagnosis not present

## 2016-11-26 DIAGNOSIS — B360 Pityriasis versicolor: Secondary | ICD-10-CM

## 2016-11-26 DIAGNOSIS — E782 Mixed hyperlipidemia: Secondary | ICD-10-CM

## 2016-11-26 DIAGNOSIS — R809 Proteinuria, unspecified: Secondary | ICD-10-CM

## 2016-11-26 LAB — POCT GLYCOSYLATED HEMOGLOBIN (HGB A1C): HEMOGLOBIN A1C: 5.5

## 2016-11-26 MED ORDER — FLECTOR 1.3 % TD PTCH: 2.0000 | MEDICATED_PATCH | Freq: Two times a day (BID) | TRANSDERMAL | 2 refills | Status: DC | PRN

## 2016-11-26 MED ORDER — METFORMIN HCL 850 MG PO TABS: 850.0000 mg | ORAL_TABLET | Freq: Two times a day (BID) | ORAL | 5 refills | Status: DC

## 2016-11-26 MED ORDER — ATORVASTATIN CALCIUM 80 MG PO TABS: 80.0000 mg | ORAL_TABLET | Freq: Every day | ORAL | 5 refills | Status: DC

## 2016-11-26 MED ORDER — SELENIUM SULFIDE 2.25 % EX LOTN: 5.0000 mL | TOPICAL_LOTION | Freq: Every day | CUTANEOUS | 2 refills | Status: DC

## 2016-11-26 MED ORDER — OMEPRAZOLE 20 MG PO CPDR: 20.0000 mg | DELAYED_RELEASE_CAPSULE | Freq: Every morning | ORAL | 5 refills | Status: DC

## 2016-11-26 NOTE — Progress Notes (Signed)
Name: [name] [name]   MRN: [id number]    DOB: 04/12/1961   Date:11/26/2016       Progress Note  Subjective  Chief Complaint  Chief Complaint  Patient presents with  . Follow-up    2 month   . Diabetes    Patient has lost 2 pounds since last visit and watching what he eats. Checking once a daily average-115  . Hypertension    still gets bad headaches and going to neurosurgeon in February   . Hyperlipidemia    Doing better with his diet-eating more fruits and vegetables.  . Gastroesophageal Reflux    Everytime he eats he feels like he has to eat or go to the bathroom and having blood in stool  . 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 Taking Metformin and hgbA1C is well controlled, down to 5.5% and we will try to wean down the dose to once daily. Diagnosed in 07/2014. He is following a diabetic diet, exercising- walking 45 minutes five days a week. Denies polyphagia, polydipsia and polyuria. Marland Kitchen He is due for an eye exam.   HTN: doing well, he is compliant with medication, he is now on higher dose of Diovan,  he is also on Carvedilol denies side effects of medication  Hyperlipidemia: taking Atorvastatin, denies myalgia, reviewed last labs and it looks great  GERD: under control with medication No heartburn or regurgitation  CAD: he has chest pain seldom, usually just with strenuos activity, unless moderate activity - sees cardiologist - Dr. Fletcher Anon - on Ranexa on statin, aspirin and beta-blocker. Dr. Fletcher Anon knows that he is taking Sildenafil.   Chronic neck pain with intermittent  radiculitis: having pain daily, using patches, seeing neurosurgeon at [facility], he was going to the pain clinic, he does no want to get steroid injections at this time. He states he has  paresthesia daily. He would like a second opinion - we made appointment but he never got a phone call from them.   Chronic Low back pain: using Flector patchers daily and is controlling pain, also uses ice pack. He stopped going to pain clinic - Dr. Levora Angel, because of the distance.   OSA: he wears CPAP every night, but still feels tired during the day if he does not take Nuvigil . 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 He has memory loss and was referred to neurologist but appointment is in Feb  Abdominal pain/blood in stools/nausea: he states that 2 months ago he noticed a change in bowel movements. He wakes up and has loose stools , and blood is mixed in stools, upper abdominal and nausea, he also has multiple bowel movements daily after meals. No recent change in medication, he denies antibiotic use prior to symptoms. No sick contacts at home. He is up to date with colonoscopy ( done 3 years ). Weight is stable, appetite is still good  Obesity: he has been walking, eating more vegetables/fruit and tree nuts. Also grilling food instead of frying.   Patient Active Problem List   Diagnosis Date Noted  . Coronary artery disease involving native coronary artery of native heart   . Radiculitis  of left cervical region 03/05/2016  . Diabetic neuropathy associated with type 2 diabetes mellitus ([place]) 12/05/2015  . Patellar subluxation 06/05/2015  . Allergic rhinitis, seasonal 06/05/2015  . Chronic constipation 06/05/2015  . Chronic LBP 06/05/2015  . Morbid obesity due to excess calories ([place]) 06/05/2015  . Vitamin D deficiency 06/05/2015  . Central sleep apnea 06/05/2015  . 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 Nerve root pain 06/05/2015  . Acquired polycythemia 06/05/2015  . Anterior knee pain 06/05/2015  . Dysmetabolic syndrome 99991111  . Failure of erection 06/05/2015  . Gastro-esophageal reflux disease without esophagitis 06/05/2015  . Neuropathy  ([place]) 06/05/2015  . CAD (coronary artery disease)   . Angina, class III ([place]) 05/23/2015  . Essential hypertension 05/23/2015  . Hyperlipidemia 05/23/2015  . Inguinal hernia without mention of obstruction or gangrene, recurrent unilateral or unspecified 03/24/2013    Past Surgical History:  Procedure Laterality Date  . BACK SURGERY  12/2008  . CARDIAC CATHETERIZATION N/A 05/30/2015   Procedure: Left Heart Cath;  Surgeon: Wellington Hampshire, MD;  Location: [facility];  Service: Cardiovascular;  Laterality: N/A;  . CARDIAC CATHETERIZATION N/A 04/16/2016   Procedure: Left Heart Cath and Coronary Angiography;  Surgeon: Wellington Hampshire, MD;  Location: [facility];  Service: Cardiovascular;  Laterality: N/A;  . COLONOSCOPY  2014   Dr. [name]  . HERNIA REPAIR  1991  . INGUINAL HERNIA REPAIR Right 2012  . INGUINAL HERNIA REPAIR Right 2014    Family History  Problem Relation Age of Onset  . Heart Problems Mother   . Heart Problems Father 63    myocardial infarction  . Mental illness Brother     Social History   Social History  . Marital status: Single    Spouse name: N/A  . Number of children: N/A  . Years of education: N/A   Occupational History  . Not on file.   Social History Main Topics  . Smoking status: Former Smoker    Years: 2.00    Types: Cigars    Start date: 12/28/2002    Quit date: 06/04/2005  . Smokeless tobacco: Never Used  . Alcohol use 0.6 oz/week    1 Glasses of wine per week  . Drug use: No  . Sexual activity: Yes    Partners: Female   Other Topics Concern  . Not on file   Social History Narrative  . No narrative on file     Current Outpatient Prescriptions:  .  Armodafinil 150 MG tablet, take 1 tablet by mouth once daily, Disp: 30 tablet, Rfl: 2 .  aspirin 81 MG tablet, Take 81 mg by mouth daily., Disp: , Rfl:  .  atorvastatin (LIPITOR) 80 MG tablet, Take 1 tablet (80 mg total) by mouth daily., Disp: 30 tablet, Rfl: 5 .   carvedilol (COREG) 12.5 MG tablet, Take 1 tablet (12.5 mg total) by mouth 2 (two) times daily., Disp: 60 tablet, Rfl: 3 .  cetirizine (ZYRTEC) 10 MG tablet, Take 10 mg by mouth daily. , Disp: , Rfl: 1 .  FLECTOR 1.3 % PTCH, Place 2 patches onto the skin 2 (two) times daily as needed (pain)., Disp: 60 patch, Rfl: 2 .  fluticasone (FLONASE) 50 MCG/ACT nasal spray, Place 2 sprays into the nose as needed., Disp: , Rfl:  .  loratadine (CLARITIN) 10 MG tablet, Take 10 mg by mouth as needed. , Disp: , Rfl:  .  metFORMIN (GLUCOPHAGE) 850 MG tablet, Take 1 tablet (850 mg  total) by mouth 2 (two) times daily with a meal., Disp: 60 tablet, Rfl: 5 .  nitroGLYCERIN (NITROSTAT) 0.4 MG SL tablet, Place 1 tablet (0.4 mg total) under the tongue every 5 (five) minutes as needed for chest pain., Disp: 15 tablet, Rfl: 1 .  omeprazole (PRILOSEC) 20 MG capsule, Take 1 capsule (20 mg total) by mouth every morning., Disp: 30 capsule, Rfl: 5 .  polyethylene glycol powder (GLYCOLAX/MIRALAX) powder, Take 8 g by mouth as needed. For constipation, Disp: , Rfl: 0 .  RANEXA 1000 MG SR tablet, take 1 tablet by mouth twice a day, Disp: 60 tablet, Rfl: 5 .  sildenafil (REVATIO) 20 MG tablet, take 1-2 tablets by mouth once daily if needed, Disp: 60 tablet, Rfl: 2 .  valsartan (DIOVAN) 160 MG tablet, Take 1 tablet by mouth 2 (two) times daily with a meal., Disp: , Rfl: 0 .  Selenium Sulfide 2.25 % LOTN, Apply 5 mLs topically daily., Disp: 180 mL, Rfl: 2  Allergies  Allergen Reactions  . Gabapentin     Groggy-Mood Changes  . Latex   . Penicillins Hives and Rash     ROS  Constitutional: Negative for fever or weight change.  Respiratory: Negative for cough and shortness of breath.   Cardiovascular: Negative for chest pain or palpitations.  Gastrointestinal: Positive  for abdominal pain, and  bowel changes.  Musculoskeletal: Negative for gait problem or joint swelling.  Skin: Negative for rash.  Neurological: Negative for  dizziness or headache.  No other specific complaints in a complete review of systems (except as listed in HPI above).  Objective  Vitals:   11/26/16 0938  BP: 122/84  Pulse: 68  Resp: 16  Temp: 98.1 F (36.7 C)  TempSrc: Oral  SpO2: 97%  Weight: 253 lb 8 oz (115 kg)  Height: 5\' 11"  (1.803 m)    Body mass index is 35.36 kg/m.  Physical Exam   Constitutional: Patient appears well-developed and well-nourished. Obese No distress.  HEENT: head atraumatic, normocephalic, pupils equal and reactive to light,  neck supple, throat within normal limits Cardiovascular: Normal rate, regular rhythm and normal heart sounds.  No murmur heard. No BLE edema. Pulmonary/Chest: Effort normal and breath sounds normal. No respiratory distress. Abdominal: Soft.  There is no tenderness. Psychiatric: Patient has a normal mood and affect. behavior is normal. Judgment and thought content normal. Muscular Skeletal: some puffiness on upper part of his lumbar spine surgery, no redness or increase in warmth, para spinal pain during palpation, negative straight leg raise   Recent Results (from the past 2160 hour(s))  POCT HgB A1C     Status: None   Collection Time: 11/26/16  9:48 AM  Result Value Ref Range   Hemoglobin A1C 5.5     PHQ2/9: Depression screen [facility] 2/9 11/26/2016 07/10/2016 03/05/2016 12/05/2015 09/05/2015  Decreased Interest 0 0 0 0 0  Down, Depressed, Hopeless 0 0 0 0 0  PHQ - 2 Score 0 0 0 0 0    Fall Risk: Fall Risk  11/26/2016 07/10/2016 03/05/2016 12/05/2015 09/05/2015  Falls in the past year? No No Yes Yes No  Number falls in past yr: - - 2 or more 2 or more -  Injury with Fall? - - Yes Yes -    Functional Status Survey: Is the patient deaf or have difficulty hearing?: No Does the patient have difficulty seeing, even when wearing glasses/contacts?: No Does the patient have difficulty concentrating, remembering, or making decisions?: No Does the patient  have difficulty walking or  climbing stairs?: No Does the patient have difficulty dressing or bathing?: No Does the patient have difficulty doing errands alone such as visiting a doctor's office or shopping?: No   Assessment & Plan  1. Controlled type 2 diabetes with neuropathy ([place])  Doing great, he will try decreasing to one pill daily before next visit - POCT HgB A1C - metFORMIN (GLUCOPHAGE) 850 MG tablet; Take 1 tablet (850 mg total) by mouth 2 (two) times daily with a meal.  Dispense: 60 tablet; Refill: 5  2. Central sleep apnea  Continue CPAP every night  3. Essential hypertension  At goal, doing well   4. Mixed hyperlipidemia  - atorvastatin (LIPITOR) 80 MG tablet; Take 1 tablet (80 mg total) by mouth daily.  Dispense: 30 tablet; Refill: 5  5. Angina, class III ([place])  Seen by Dr. Fletcher Anon and is doing well, no chest pain now  6. Gastro-esophageal reflux disease without esophagitis  - omeprazole (PRILOSEC) 20 MG capsule; Take 1 capsule (20 mg total) by mouth every morning.  Dispense: 30 capsule; Refill: 5  7. Abdominal pain in male  - Ambulatory referral to Gastroenterology  8. Recent change in frequency of bowel movements  - Ambulatory referral to Gastroenterology  9. Hematochezia  - Ambulatory referral to Gastroenterology  10. Tinea versicolor  - Selenium Sulfide 2.25 % LOTN; Apply 5 mLs topically daily.  Dispense: 180 mL; Refill: 2  11. Chronic midline low back pain without sciatica  - FLECTOR 1.3 % PTCH; Place 2 patches onto the skin 2 (two) times daily as needed (pain).  Dispense: 60 patch; Refill: 2  12. Morbid obesity due to excess calories ([place])  He is doing well, walking daily and has lost some weight, making better food choices  13. Controlled type 2 diabetes mellitus with microalbuminuria, without long-term current use of insulin ([facility])  Continue ARB

## 2016-11-27 ENCOUNTER — Other Ambulatory Visit: Payer: Self-pay

## 2016-11-27 DIAGNOSIS — G4731 Primary central sleep apnea: Secondary | ICD-10-CM

## 2016-11-27 MED ORDER — MODAFINIL 200 MG PO TABS: 200.0000 mg | ORAL_TABLET | Freq: Every day | ORAL | 2 refills | Status: DC

## 2016-11-27 MED ORDER — DICLOFENAC SODIUM 1 % TD GEL
4.0000 g | Freq: Four times a day (QID) | TRANSDERMAL | 2 refills | Status: DC
Start: 2016-11-27 — End: 2018-12-23

## 2016-11-30 ENCOUNTER — Ambulatory Visit: Payer: Medicaid Other | Admitting: Gastroenterology

## 2016-12-03 ENCOUNTER — Other Ambulatory Visit: Payer: Self-pay | Admitting: Physician Assistant

## 2016-12-04 ENCOUNTER — Other Ambulatory Visit: Payer: Self-pay

## 2016-12-04 MED ORDER — MODAFINIL 200 MG PO TABS: 200.0000 mg | ORAL_TABLET | Freq: Every day | ORAL | 0 refills | Status: DC

## 2016-12-15 ENCOUNTER — Other Ambulatory Visit: Payer: Self-pay

## 2016-12-15 ENCOUNTER — Encounter: Payer: Self-pay | Admitting: Gastroenterology

## 2016-12-15 ENCOUNTER — Ambulatory Visit (INDEPENDENT_AMBULATORY_CARE_PROVIDER_SITE_OTHER): Payer: Medicaid Other | Admitting: Gastroenterology

## 2016-12-15 VITALS — BP 137/80 | HR 60 | Temp 97.8°F | Ht 71.0 in | Wt 256.6 lb

## 2016-12-15 DIAGNOSIS — R197 Diarrhea, unspecified: Secondary | ICD-10-CM

## 2016-12-15 DIAGNOSIS — K921 Melena: Secondary | ICD-10-CM | POA: Diagnosis not present

## 2016-12-15 NOTE — Progress Notes (Signed)
Gastroenterology Consultation  Referring Provider:     Steele Sizer, MD Primary Care Physician:  [name], MD Primary Gastroenterologist:  Dr. Jonathon Bellows  Reason for Consultation:     Blood in stool         HPI:   [name] [name] is a 55 y.o. y/o male referred for consultation & management  by Dr. [name], MD.     He has been referred for blood in his stool. He has had a colonoscopy back in 2014 which was normal .  He says that for the last 2-3 months noticing blood in stool, blood mixed in the stool and in the bowl . He is having 5 bowel movements a day , 30 minutes after a meal. Stool is generally very loose. When he coughs or sneeze has an accident. Denies any NSAID's. Ex smoker 6 years back he quit. 2 nephews had cystic fibrosis. His mother had crohns disease.   He has some joint pains in his wrist and thumbs, some skin spots .   Wakes up in the night with bowel movements. Lost 21 lbs over a year. Feels weak .   Past Medical History:  Diagnosis Date  . Allergy   . CAD (coronary artery disease)    a. cardiac cath 05/30/2015: LM nl, mLAD 50%, LCx minor irregs, RCA minor irregs, EF 55-65%, no WMA, no MR/AS, nl LVEDP, recommend aggressive med Rx  . Degeneration of intervertebral disc of cervical region   . Diabetes mellitus without complication ([place])   . Hernia 1991   02/10/2012-RIH repair  . Hyperlipidemia   . Hypertension 2008  . Inguinal hernia without mention of obstruction or gangrene, recurrent unilateral or unspecified 2012  . Obesity, unspecified 2012  . Personal history of tobacco use, presenting hazards to health 2012  . Sleep apnea   . Umbilical hernia without mention of obstruction or gangrene 2012    Past Surgical History:  Procedure Laterality Date  . BACK SURGERY  12/2008  . CARDIAC CATHETERIZATION N/A 05/30/2015   Procedure: Left Heart Cath;  Surgeon: Wellington Hampshire, MD;  Location: [facility];  Service: Cardiovascular;   Laterality: N/A;  . CARDIAC CATHETERIZATION N/A 04/16/2016   Procedure: Left Heart Cath and Coronary Angiography;  Surgeon: Wellington Hampshire, MD;  Location: [facility];  Service: Cardiovascular;  Laterality: N/A;  . COLONOSCOPY  2014   Dr. [name]  . HERNIA REPAIR  1991  . INGUINAL HERNIA REPAIR Right 2012  . INGUINAL HERNIA REPAIR Right 2014    Prior to Admission medications   Medication Sig Start Date End Date Taking? Authorizing Provider  aspirin 81 MG tablet Take 81 mg by mouth daily.   Yes Historical Provider, MD  atorvastatin (LIPITOR) 80 MG tablet Take 1 tablet (80 mg total) by mouth daily. 11/26/16  Yes Steele Sizer, MD  carvedilol (COREG) 12.5 MG tablet Take 1 tablet (12.5 mg total) by mouth 2 (two) times daily. 05/04/16  Yes Minna Merritts, MD  cetirizine (ZYRTEC) 10 MG tablet Take 10 mg by mouth daily.  07/09/16  Yes Historical Provider, MD  diclofenac sodium (VOLTAREN) 1 % GEL Apply 4 g topically 4 (four) times daily. 11/27/16  Yes Steele Sizer, MD  fluticasone (FLONASE) 50 MCG/ACT nasal spray Place 2 sprays into the nose as needed. 03/05/15  Yes Historical Provider, MD  loratadine (CLARITIN) 10 MG tablet Take 10 mg by mouth as needed.    Yes Historical Provider,  MD  metFORMIN (GLUCOPHAGE) 850 MG tablet Take 1 tablet (850 mg total) by mouth 2 (two) times daily with a meal. 11/26/16  Yes Steele Sizer, MD  modafinil (PROVIGIL) 200 MG tablet Take 1 tablet (200 mg total) by mouth daily. 12/04/16  Yes Steele Sizer, MD  nitroGLYCERIN (NITROSTAT) 0.4 MG SL tablet Place 1 tablet (0.4 mg total) under the tongue every 5 (five) minutes as needed for chest pain. 05/23/15  Yes Wellington Hampshire, MD  omeprazole (PRILOSEC) 20 MG capsule Take 1 capsule (20 mg total) by mouth every morning. 11/26/16  Yes Steele Sizer, MD  RANEXA 1000 MG SR tablet take 1 tablet by mouth twice a day 06/05/16  Yes Wellington Hampshire, MD  Selenium Sulfide 2.25 % LOTN Apply 5 mLs topically daily. 11/26/16  Yes  Steele Sizer, MD  sildenafil (REVATIO) 20 MG tablet take 1-2 tablets by mouth once daily if needed 11/24/16  Yes Steele Sizer, MD  valsartan (DIOVAN) 160 MG tablet take 1 tablet by mouth once daily 12/03/16  Yes Wellington Hampshire, MD  polyethylene glycol powder (GLYCOLAX/MIRALAX) powder Take 8 g by mouth as needed. For constipation 03/05/15   Historical Provider, MD    Family History  Problem Relation Age of Onset  . Heart Problems Mother   . Heart Problems Father 30    myocardial infarction  . Mental illness Brother      Social History  Substance Use Topics  . Smoking status: Former Smoker    Years: 2.00    Types: Cigars    Start date: 12/28/2002    Quit date: 06/04/2005  . Smokeless tobacco: Never Used  . Alcohol use 0.6 oz/week    1 Glasses of wine per week    Allergies as of 12/15/2016 - Review Complete 12/15/2016  Allergen Reaction Noted  . Gabapentin  04/16/2015  . Latex  06/20/2015  . Penicillins Hives and Rash 03/23/2013    Review of Systems:    All systems reviewed and negative except where noted in HPI.   Physical Exam:  BP 137/80   Pulse 60   Temp 97.8 F (36.6 C) (Oral)   Ht 5\' 11"  (1.803 m)   Wt 256 lb 9.6 oz (116.4 kg)   BMI 35.79 kg/m  No LMP for male patient. Psych:  Alert and cooperative. Normal mood and affect. General:   Alert,  Well-developed, well-nourished, pleasant and cooperative in NAD Head:  Normocephalic and atraumatic. Eyes:  Sclera clear, no icterus.   Conjunctiva pink. Ears:  Normal auditory acuity. Nose:  No deformity, discharge, or lesions. Mouth:  No deformity or lesions,oropharynx pink & moist. Neck:  Supple; no masses or thyromegaly. Lungs:  Respirations even and unlabored.  Clear throughout to auscultation.   No wheezes, crackles, or rhonchi. No acute distress. Heart:  Regular rate and rhythm; no murmurs, clicks, rubs, or gallops. Abdomen:  Normal bowel sounds.  No bruits.  Soft, non-tender and non-distended without masses,  hepatosplenomegaly or hernias noted.  No guarding or rebound tenderness.    Msk:  Symmetrical without gross deformities. Good, equal movement & strength bilaterally. Psych:  Alert and cooperative. Normal mood and affect.  Imaging Studies: No results found.  Assessment and Plan:   [name] [name] is a 55 y.o. y/o male has been referred for new onset rectal bleeding , diarrhea , crampy abdominal discomfort. His symptoms concerns me for inflammatory bowel disease. I would like to rule out a bowel infection with stool tests , obtain baseline lab  work and proceed with a colonoscopy.   3Follow up in 6 weeks   Dr Jonathon Bellows MD

## 2016-12-17 ENCOUNTER — Other Ambulatory Visit
Admission: RE | Admit: 2016-12-17 | Discharge: 2016-12-17 | Disposition: A | Discharge status: Home / Self Care | Payer: Medicaid Other | Source: Ambulatory Visit | Attending: Gastroenterology | Admitting: Gastroenterology

## 2016-12-17 DIAGNOSIS — R197 Diarrhea, unspecified: Secondary | ICD-10-CM | POA: Diagnosis not present

## 2016-12-17 LAB — CBC WITH DIFFERENTIAL/PLATELET
BASOS ABS: 0 10*3/uL (ref 0–0.1)
Basophils Relative: 1 %
Eosinophils Absolute: 0.1 10*3/uL (ref 0–0.7)
Eosinophils Relative: 1 %
HEMATOCRIT: 47.8 % (ref 40.0–52.0)
HEMOGLOBIN: 16.5 g/dL (ref 13.0–18.0)
Lymphocytes Relative: 32 %
Lymphs Abs: 2.7 10*3/uL (ref 1.0–3.6)
MCH: 32.4 pg (ref 26.0–34.0)
MCHC: 34.4 g/dL (ref 32.0–36.0)
MCV: 94.3 fL (ref 80.0–100.0)
Monocytes Absolute: 0.7 10*3/uL (ref 0.2–1.0)
Monocytes Relative: 8 %
NEUTROS ABS: 5 10*3/uL (ref 1.4–6.5)
NEUTROS PCT: 58 %
Platelets: 212 10*3/uL (ref 150–440)
RBC: 5.07 MIL/uL (ref 4.40–5.90)
RDW: 13.7 % (ref 11.5–14.5)
WBC: 8.5 10*3/uL (ref 3.8–10.6)

## 2016-12-17 LAB — BASIC METABOLIC PANEL
Anion gap: 10 (ref 5–15)
BUN: 9 mg/dL (ref 6–20)
CHLORIDE: 105 mmol/L (ref 101–111)
CO2: 24 mmol/L (ref 22–32)
CREATININE: 0.83 mg/dL (ref 0.61–1.24)
Calcium: 9.1 mg/dL (ref 8.9–10.3)
GFR calc non Af Amer: >60 mL/min (ref >60)
Glucose, Bld: 114 mg/dL — ABNORMAL HIGH (ref 65–99)
Potassium: 3.2 mmol/L — ABNORMAL LOW (ref 3.5–5.1)
SODIUM: 139 mmol/L (ref 135–145)

## 2016-12-17 LAB — HEPATIC FUNCTION PANEL
ALBUMIN: 4.4 g/dL (ref 3.5–5.0)
ALT: 36 U/L (ref 17–63)
AST: 36 U/L (ref 15–41)
Alkaline Phosphatase: 63 U/L (ref 38–126)
Bilirubin, Direct: 0.1 mg/dL (ref 0.1–0.5)
Indirect Bilirubin: 0.6 mg/dL (ref 0.3–0.9)
TOTAL PROTEIN: 7.9 g/dL (ref 6.5–8.1)
Total Bilirubin: 0.7 mg/dL (ref 0.3–1.2)

## 2016-12-17 LAB — C-REACTIVE PROTEIN: CRP: <0.8 mg/dL (ref <1.0)

## 2016-12-18 ENCOUNTER — Ambulatory Visit
Admission: RE | Admit: 2016-12-18 | Discharge: 2016-12-18 | Disposition: A | Discharge status: Home / Self Care | Payer: Medicaid Other | Source: Ambulatory Visit | Attending: Gastroenterology | Admitting: Gastroenterology

## 2016-12-18 DIAGNOSIS — R197 Diarrhea, unspecified: Secondary | ICD-10-CM | POA: Diagnosis present

## 2016-12-18 LAB — GASTROINTESTINAL PANEL BY PCR, STOOL (REPLACES STOOL CULTURE)
ADENOVIRUS F40/41: NOT DETECTED
ASTROVIRUS: NOT DETECTED
CAMPYLOBACTER SPECIES: NOT DETECTED
Cryptosporidium: NOT DETECTED
Cyclospora cayetanensis: NOT DETECTED
ENTEROTOXIGENIC E COLI (ETEC): NOT DETECTED
Entamoeba histolytica: NOT DETECTED
Enteroaggregative E coli (EAEC): NOT DETECTED
Enteropathogenic E coli (EPEC): NOT DETECTED
Giardia lamblia: NOT DETECTED
NOROVIRUS GI/GII: NOT DETECTED
PLESIMONAS SHIGELLOIDES: NOT DETECTED
ROTAVIRUS A: NOT DETECTED
SHIGA LIKE TOXIN PRODUCING E COLI (STEC): NOT DETECTED
Salmonella species: NOT DETECTED
Sapovirus (I, II, IV, and V): NOT DETECTED
Shigella/Enteroinvasive E coli (EIEC): NOT DETECTED
Vibrio cholerae: NOT DETECTED
Vibrio species: NOT DETECTED
Yersinia enterocolitica: NOT DETECTED

## 2016-12-18 LAB — C DIFFICILE QUICK SCREEN W PCR REFLEX
C DIFFICILE (CDIFF) TOXIN: NEGATIVE
C DIFFICLE (CDIFF) ANTIGEN: NEGATIVE
C Diff interpretation: NOT DETECTED

## 2016-12-24 LAB — FECAL LACTOFERRIN, QUANT

## 2016-12-25 ENCOUNTER — Encounter: Payer: Self-pay | Admitting: *Deleted

## 2016-12-25 ENCOUNTER — Ambulatory Visit: Payer: Medicaid Other | Admitting: Anesthesiology

## 2016-12-25 ENCOUNTER — Encounter
Admission: RE | Disposition: A | Discharge status: Home / Self Care | Payer: Self-pay | Source: Ambulatory Visit | Attending: Gastroenterology

## 2016-12-25 ENCOUNTER — Ambulatory Visit
Admission: RE | Admit: 2016-12-25 | Discharge: 2016-12-25 | Disposition: A | Discharge status: Home / Self Care | Payer: Medicaid Other | Source: Ambulatory Visit | Attending: Gastroenterology | Admitting: Gastroenterology

## 2016-12-25 DIAGNOSIS — Z6835 Body mass index (BMI) 35.0-35.9, adult: Secondary | ICD-10-CM | POA: Diagnosis not present

## 2016-12-25 DIAGNOSIS — I2510 Atherosclerotic heart disease of native coronary artery without angina pectoris: Secondary | ICD-10-CM | POA: Diagnosis not present

## 2016-12-25 DIAGNOSIS — Z5309 Procedure and treatment not carried out because of other contraindication: Secondary | ICD-10-CM | POA: Diagnosis not present

## 2016-12-25 DIAGNOSIS — K219 Gastro-esophageal reflux disease without esophagitis: Secondary | ICD-10-CM | POA: Insufficient documentation

## 2016-12-25 DIAGNOSIS — Z87891 Personal history of nicotine dependence: Secondary | ICD-10-CM | POA: Diagnosis not present

## 2016-12-25 DIAGNOSIS — E119 Type 2 diabetes mellitus without complications: Secondary | ICD-10-CM | POA: Diagnosis not present

## 2016-12-25 DIAGNOSIS — I10 Essential (primary) hypertension: Secondary | ICD-10-CM | POA: Diagnosis not present

## 2016-12-25 DIAGNOSIS — E785 Hyperlipidemia, unspecified: Secondary | ICD-10-CM | POA: Insufficient documentation

## 2016-12-25 DIAGNOSIS — M1990 Unspecified osteoarthritis, unspecified site: Secondary | ICD-10-CM | POA: Insufficient documentation

## 2016-12-25 DIAGNOSIS — E669 Obesity, unspecified: Secondary | ICD-10-CM | POA: Diagnosis not present

## 2016-12-25 DIAGNOSIS — G4730 Sleep apnea, unspecified: Secondary | ICD-10-CM | POA: Diagnosis not present

## 2016-12-25 DIAGNOSIS — K625 Hemorrhage of anus and rectum: Secondary | ICD-10-CM | POA: Diagnosis not present

## 2016-12-25 HISTORY — PX: COLONOSCOPY WITH PROPOFOL: SHX5780

## 2016-12-25 LAB — GLUCOSE, CAPILLARY: GLUCOSE-CAPILLARY: 121 mg/dL — AB (ref 65–99)

## 2016-12-25 SURGERY — COLONOSCOPY WITH PROPOFOL
Anesthesia: General

## 2016-12-25 MED ORDER — PROPOFOL 10 MG/ML IV BOLUS
INTRAVENOUS | Status: DC | PRN
  Administered 2016-12-25: 30 mg via INTRAVENOUS
  Administered 2016-12-25: 70 mg via INTRAVENOUS

## 2016-12-25 MED ORDER — SODIUM CHLORIDE 0.9 % IV SOLN
INTRAVENOUS | Status: DC
Start: 2016-12-25 — End: 2016-12-25
  Administered 2016-12-25: 10:00:00 via INTRAVENOUS

## 2016-12-25 MED ORDER — PROPOFOL 500 MG/50ML IV EMUL
INTRAVENOUS | Status: DC | PRN
  Administered 2016-12-25: 120 ug/kg/min via INTRAVENOUS

## 2016-12-25 MED ORDER — PROPOFOL 10 MG/ML IV BOLUS
INTRAVENOUS | Status: AC
  Filled 2016-12-25: qty 20

## 2016-12-25 MED ORDER — LIDOCAINE 2% (20 MG/ML) 5 ML SYRINGE
INTRAMUSCULAR | Status: AC
  Filled 2016-12-25: qty 5

## 2016-12-25 MED ORDER — LIDOCAINE 2% (20 MG/ML) 5 ML SYRINGE
INTRAMUSCULAR | Status: DC | PRN
  Administered 2016-12-25: 50 mg via INTRAVENOUS

## 2016-12-25 NOTE — Anesthesia Preprocedure Evaluation (Signed)
Anesthesia Evaluation  Patient identified by MRN, date of birth, ID band Patient awake    Reviewed: Allergy & Precautions, NPO status , Patient's Chart, lab work & pertinent test results, reviewed documented beta blocker date and time   Airway Mallampati: III  TM Distance: >3 FB     Dental  (+) Chipped   Pulmonary sleep apnea , former smoker,           Cardiovascular hypertension, Pt. on medications and Pt. on home beta blockers + angina + CAD       Neuro/Psych  Neuromuscular disease    GI/Hepatic GERD  ,  Endo/Other  diabetes, Type 2  Renal/GU      Musculoskeletal  (+) Arthritis ,   Abdominal   Peds  Hematology   Anesthesia Other Findings Last EF 50. No NTG.  Reproductive/Obstetrics                             Anesthesia Physical Anesthesia Plan  ASA: III  Anesthesia Plan: General   Post-op Pain Management:    Induction: Intravenous  Airway Management Planned: Nasal Cannula  Additional Equipment:   Intra-op Plan:   Post-operative Plan:   Informed Consent: I have reviewed the patients History and Physical, chart, labs and discussed the procedure including the risks, benefits and alternatives for the proposed anesthesia with the patient or authorized representative who has indicated his/her understanding and acceptance.     Plan Discussed with: CRNA  Anesthesia Plan Comments:         Anesthesia Quick Evaluation

## 2016-12-25 NOTE — H&P (Signed)
Jonathon Bellows MD [address] [place], [place] [address] Phone: [phone] Fax : [phone]  Primary Care Physician:  [name], MD Primary Gastroenterologist:  Dr. Jonathon Bellows   Pre-Procedure History & Physical: HPI:  [name] [name] is a 55 y.o. male is here for an colonoscopy.   Past Medical History:  Diagnosis Date  . Allergy   . CAD (coronary artery disease)    a. cardiac cath 05/30/2015: LM nl, mLAD 50%, LCx minor irregs, RCA minor irregs, EF 55-65%, no WMA, no MR/AS, nl LVEDP, recommend aggressive med Rx  . Degeneration of intervertebral disc of cervical region   . Diabetes mellitus without complication ([place])   . Hernia 1991   02/10/2012-RIH repair  . Hyperlipidemia   . Hypertension 2008  . Inguinal hernia without mention of obstruction or gangrene, recurrent unilateral or unspecified 2012  . Obesity, unspecified 2012  . Personal history of tobacco use, presenting hazards to health 2012  . Sleep apnea   . Umbilical hernia without mention of obstruction or gangrene 2012    Past Surgical History:  Procedure Laterality Date  . BACK SURGERY  12/2008  . CARDIAC CATHETERIZATION N/A 05/30/2015   Procedure: Left Heart Cath;  Surgeon: Wellington Hampshire, MD;  Location: [facility];  Service: Cardiovascular;  Laterality: N/A;  . CARDIAC CATHETERIZATION N/A 04/16/2016   Procedure: Left Heart Cath and Coronary Angiography;  Surgeon: Wellington Hampshire, MD;  Location: [facility];  Service: Cardiovascular;  Laterality: N/A;  . COLONOSCOPY  2014   Dr. [name]  . HERNIA REPAIR  1991  . INGUINAL HERNIA REPAIR Right 2012  . INGUINAL HERNIA REPAIR Right 2014    Prior to Admission medications   Medication Sig Start Date End Date Taking? Authorizing Provider  atorvastatin (LIPITOR) 80 MG tablet Take 1 tablet (80 mg total) by mouth daily. 11/26/16  Yes Steele Sizer, MD  carvedilol (COREG) 12.5 MG tablet Take 1 tablet (12.5 mg total) by mouth 2 (two) times  daily. 05/04/16  Yes Minna Merritts, MD  cetirizine (ZYRTEC) 10 MG tablet Take 10 mg by mouth daily.  07/09/16  Yes Historical Provider, MD  diclofenac sodium (VOLTAREN) 1 % GEL Apply 4 g topically 4 (four) times daily. 11/27/16  Yes Steele Sizer, MD  fluticasone (FLONASE) 50 MCG/ACT nasal spray Place 2 sprays into the nose as needed. 03/05/15  Yes Historical Provider, MD  loratadine (CLARITIN) 10 MG tablet Take 10 mg by mouth as needed.    Yes Historical Provider, MD  metFORMIN (GLUCOPHAGE) 850 MG tablet Take 1 tablet (850 mg total) by mouth 2 (two) times daily with a meal. 11/26/16  Yes Steele Sizer, MD  modafinil (PROVIGIL) 200 MG tablet Take 1 tablet (200 mg total) by mouth daily. 12/04/16  Yes Steele Sizer, MD  nitroGLYCERIN (NITROSTAT) 0.4 MG SL tablet Place 1 tablet (0.4 mg total) under the tongue every 5 (five) minutes as needed for chest pain. 05/23/15  Yes Wellington Hampshire, MD  omeprazole (PRILOSEC) 20 MG capsule Take 1 capsule (20 mg total) by mouth every morning. 11/26/16  Yes Steele Sizer, MD  Selenium Sulfide 2.25 % LOTN Apply 5 mLs topically daily. 11/26/16  Yes Steele Sizer, MD  valsartan (DIOVAN) 160 MG tablet take 1 tablet by mouth once daily 12/03/16  Yes Wellington Hampshire, MD  aspirin 81 MG tablet Take 81 mg by mouth daily.    Historical Provider, MD  polyethylene glycol powder (GLYCOLAX/MIRALAX) powder Take 8 g  by mouth as needed. For constipation 03/05/15   Historical Provider, MD  RANEXA 1000 MG SR tablet take 1 tablet by mouth twice a day 06/05/16   Wellington Hampshire, MD  sildenafil (REVATIO) 20 MG tablet take 1-2 tablets by mouth once daily if needed 11/24/16   Steele Sizer, MD    Allergies as of 12/15/2016 - Review Complete 12/15/2016  Allergen Reaction Noted  . Gabapentin  04/16/2015  . Latex  06/20/2015  . Penicillins Hives and Rash 03/23/2013    Family History  Problem Relation Age of Onset  . Heart Problems Mother   . Heart Problems Father 64    myocardial  infarction  . Mental illness Brother     Social History   Social History  . Marital status: Single    Spouse name: N/A  . Number of children: N/A  . Years of education: N/A   Occupational History  . Not on file.   Social History Main Topics  . Smoking status: Former Smoker    Years: 2.00    Types: Cigars    Start date: 12/28/2002    Quit date: 06/04/2005  . Smokeless tobacco: Never Used  . Alcohol use 0.6 oz/week    1 Glasses of wine per week  . Drug use: No  . Sexual activity: Yes    Partners: Female   Other Topics Concern  . Not on file   Social History Narrative  . No narrative on file    Review of Systems: See HPI, otherwise negative ROS  Physical Exam: BP (!) 153/103   Pulse 70   Temp 98 F (36.7 C) (Oral)   Resp 20   Ht 5\' 11"  (1.803 m)   Wt 256 lb (116.1 kg)   BMI 35.70 kg/m  General:   Alert,  pleasant and cooperative in NAD Head:  Normocephalic and atraumatic. Neck:  Supple; no masses or thyromegaly. Lungs:  Clear throughout to auscultation.    Heart:  Regular rate and rhythm. Abdomen:  Soft, nontender and nondistended. Normal bowel sounds, without guarding, and without rebound.   Neurologic:  Alert and  oriented x4;  grossly normal neurologically.  Impression/Plan: [name] [name] is here for an colonoscopy to be performed for diarrhea and rectal bleeding   Risks, benefits, limitations, and alternatives regarding  colonoscopy have been reviewed with the patient.  Questions have been answered.  All parties agreeable.   Jonathon Bellows, MD  12/25/2016, 9:44 AM

## 2016-12-25 NOTE — Anesthesia Postprocedure Evaluation (Signed)
Anesthesia Post Note  Patient: [name] [name]  Procedure(s) Performed: Procedure(s) (LRB): COLONOSCOPY WITH PROPOFOL (N/A)  Patient location during evaluation: Endoscopy Anesthesia Type: General Level of consciousness: awake and alert Pain management: pain level controlled Vital Signs Assessment: post-procedure vital signs reviewed and stable Respiratory status: spontaneous breathing, nonlabored ventilation, respiratory function stable and patient connected to nasal cannula oxygen Cardiovascular status: blood pressure returned to baseline and stable Postop Assessment: no signs of nausea or vomiting Anesthetic complications: no     Last Vitals:  Vitals:   12/25/16 1103 12/25/16 1113  BP: (!) 141/95 (!) 138/92  Pulse: (!) 50 (!) 51  Resp: 19 16  Temp:      Last Pain:  Vitals:   12/25/16 1043  TempSrc: Tympanic  PainSc:                  Jalena Vanderlinden S

## 2016-12-25 NOTE — Transfer of Care (Signed)
Immediate Anesthesia Transfer of Care Note  Patient: [name] [name]  Procedure(s) Performed: Procedure(s): COLONOSCOPY WITH PROPOFOL (N/A)  Patient Location: Endoscopy Unit  Anesthesia Type:General  Level of Consciousness: awake and alert   Airway & Oxygen Therapy: Patient Spontanous Breathing and Patient connected to nasal cannula oxygen  Post-op Assessment: Report given to RN and Post -op Vital signs reviewed and stable  Post vital signs: Reviewed  Last Vitals:  Vitals:   12/25/16 0855 12/25/16 1042  BP: (!) 153/103 122/88  Pulse: 70 67  Resp: 20 18  Temp: 36.7 C (!) 35.7 C    Last Pain:  Vitals:   12/25/16 0855  TempSrc: Oral  PainSc: 5          Complications: No apparent anesthesia complications

## 2016-12-25 NOTE — Op Note (Signed)
[facility] Patient Name: [name] [name] Procedure Date: 12/25/2016 10:27 AM MRN: [id number] Account #: [id number] Date of Birth: Feb 07, 1961 Admit Type: Outpatient Age: 55 Room: [facility] ENDO ROOM 4 Gender: Male Note Status: Finalized Procedure:            Colonoscopy Indications:          Rectal bleeding Providers:            Jonathon Bellows MD, MD Referring MD:         Bethena Roys. Sowles, MD (Referring MD) Complications:        No immediate complications. Procedure:            Pre-Anesthesia Assessment:                       - Prior to the procedure, a History and Physical was                        performed, and patient medications, allergies and                        sensitivities were reviewed. The patient's tolerance of                        previous anesthesia was reviewed.                       - The risks and benefits of the procedure and the                        sedation options and risks were discussed with the                        patient. All questions were answered and informed                        consent was obtained.                       - The risks and benefits of the procedure and the                        sedation options and risks were discussed with the                        patient. All questions were answered and informed                        consent was obtained.                       - ASA Grade Assessment: III - A patient with severe                        systemic disease.                       After obtaining informed consent, the colonoscope was                        passed under direct vision. Throughout the procedure,  the patient's blood pressure, pulse, and oxygen                        saturations were monitored continuously. The                        Colonoscope was introduced through the anus with the                        intention of advancing to the cecum. The scope was                 advanced to the sigmoid colon before the procedure was                        aborted. Medications were given. The colonoscopy was                        performed with ease. The patient tolerated the                        procedure well. The quality of the bowel preparation                        was poor. Findings:      The perianal and digital rectal examinations were normal.      A large amount of solid stool was found in the sigmoid colon,       interfering with visualization. Impression:           - Preparation of the colon was poor.                       - Stool in the sigmoid colon.                       - No specimens collected. Recommendation:       - Discharge patient to home (with escort).                       - Patient has a contact number available for                        emergencies. The signs and symptoms of potential                        delayed complications were discussed with the patient.                        Return to normal activities tomorrow. Written discharge                        instructions were provided to the patient.                       - Resume previous diet.                       - Continue present medications.                       - Repeat colonoscopy in 2 weeks because the bowel  preparation was suboptimal. Procedure Code(s):    --- Professional ---                       [phone], 28, Colonoscopy, flexible; diagnostic, including                        collection of specimen(s) by brushing or washing, when                        performed (separate procedure) Diagnosis Code(s):    --- Professional ---                       K62.5, Hemorrhage of anus and rectum CPT copyright 2016 American Medical Association. All rights reserved. The codes documented in this report are preliminary and upon coder review may  be revised to meet current compliance requirements. Jonathon Bellows, MD Jonathon Bellows MD, MD 12/25/2016  10:39:10 AM This report has been signed electronically. Number of Addenda: 0 Note Initiated On: 12/25/2016 10:27 AM Total Procedure Duration: 0 hours 1 minute 7 seconds       [facility]

## 2016-12-29 ENCOUNTER — Encounter: Payer: Self-pay | Admitting: Gastroenterology

## 2017-01-04 ENCOUNTER — Telehealth: Payer: Self-pay

## 2017-01-04 NOTE — Telephone Encounter (Signed)
patient had his colonoscopy on 12/25/16. He wasn't cleaned enough and needs to be rescheduled for another colonoscopy. Please call patient and advice.

## 2017-01-05 LAB — HM DIABETES EYE EXAM

## 2017-01-07 ENCOUNTER — Encounter: Payer: Self-pay | Admitting: Family Medicine

## 2017-01-13 ENCOUNTER — Other Ambulatory Visit: Payer: Self-pay | Admitting: Cardiovascular Disease

## 2017-01-13 DIAGNOSIS — R079 Chest pain, unspecified: Secondary | ICD-10-CM

## 2017-01-15 ENCOUNTER — Other Ambulatory Visit: Payer: Self-pay

## 2017-01-15 ENCOUNTER — Telehealth: Payer: Self-pay

## 2017-01-15 NOTE — Telephone Encounter (Signed)
Left vm for pt to return my call to reschedule colonoscopy. 

## 2017-01-15 NOTE — Telephone Encounter (Signed)
Pt has been rescheduled for colonoscopy at [facility] on 01/29/17 with Vicente Males. Please precert for Hemorrhage of anus and rectum K62.5

## 2017-01-15 NOTE — Telephone Encounter (Signed)
Patient left message on answering machine requesting a call back. Did not say what it was in regards to. Please call.

## 2017-01-21 ENCOUNTER — Telehealth: Payer: Self-pay

## 2017-01-21 NOTE — Telephone Encounter (Signed)
[name] [name] called wanting a sample of Suprep. I advised him to go to [facility] in [place] on Monday to pick up the bowel prep. [name] is aware.

## 2017-01-22 ENCOUNTER — Encounter: Payer: Self-pay | Admitting: Family Medicine

## 2017-01-22 ENCOUNTER — Ambulatory Visit (INDEPENDENT_AMBULATORY_CARE_PROVIDER_SITE_OTHER): Payer: Medicaid Other | Admitting: Family Medicine

## 2017-01-22 ENCOUNTER — Telehealth: Payer: Self-pay

## 2017-01-22 VITALS — BP 132/76 | HR 69 | Temp 99.1°F | Resp 18 | Ht 71.0 in | Wt 252.4 lb

## 2017-01-22 DIAGNOSIS — R52 Pain, unspecified: Secondary | ICD-10-CM

## 2017-01-22 DIAGNOSIS — B9789 Other viral agents as the cause of diseases classified elsewhere: Secondary | ICD-10-CM | POA: Diagnosis not present

## 2017-01-22 DIAGNOSIS — R05 Cough: Secondary | ICD-10-CM | POA: Diagnosis not present

## 2017-01-22 DIAGNOSIS — E1140 Type 2 diabetes mellitus with diabetic neuropathy, unspecified: Secondary | ICD-10-CM

## 2017-01-22 DIAGNOSIS — J329 Chronic sinusitis, unspecified: Secondary | ICD-10-CM

## 2017-01-22 DIAGNOSIS — R509 Fever, unspecified: Secondary | ICD-10-CM | POA: Diagnosis not present

## 2017-01-22 DIAGNOSIS — R059 Cough, unspecified: Secondary | ICD-10-CM

## 2017-01-22 LAB — POCT INFLUENZA A/B
Influenza A, POC: NEGATIVE
Influenza B, POC: NEGATIVE

## 2017-01-22 MED ORDER — HYDROCOD POLST-CPM POLST ER 10-8 MG/5ML PO SUER: 5.0000 mL | Freq: Two times a day (BID) | ORAL | 0 refills | Status: DC | PRN

## 2017-01-22 NOTE — Telephone Encounter (Signed)
Patient called and states Tussionex was not covered by Medicaid. Please switch to a covered medication.

## 2017-01-22 NOTE — Telephone Encounter (Signed)
We can try tessalon perles 100-200 mg up to every 8 hours #40

## 2017-01-22 NOTE — Progress Notes (Signed)
Name: [name] [name]   MRN: [id number]    DOB: 09-16-1961   Date:01/22/2017       Progress Note  Subjective  Chief Complaint  Chief Complaint  Patient presents with  . URI    Onset-1 week, sinus drainage, coughing, fever of 101, chest tightness, sob. Patient has tried Copywriter, advertising Plus, Coricidin HBP with no relief. Patient has felt bad and wanted to be checked out.     HPI  URI: he states grandson had a cold 6 days ago, and he started to feel sick one day after exposing himself to his grandson. Symptoms with rhinorrhea, sore throat and a dry cough. He also has noticed post-nasal drainage, fever, chest congestion, fever, SOB and decrease in appetite. He denies change in bowel movements no nausea or vomiting. Cough is productive while sleeping and phlegm is yellow. He has tried multiple otc medications without help. His chest is sore from coughing. He states glucose has bene controlled around 115-128 and BP also controlled in the 130's/70's   Patient Active Problem List   Diagnosis Date Noted  . Coronary artery disease involving native coronary artery of native heart   . Radiculitis of left cervical region 03/05/2016  . Diabetic neuropathy associated with type 2 diabetes mellitus ([place]) 12/05/2015  . Patellar subluxation 06/05/2015  . Allergic rhinitis, seasonal 06/05/2015  . Chronic constipation 06/05/2015  . Chronic LBP 06/05/2015  . Morbid obesity due to excess calories ([place]) 06/05/2015  . Vitamin D deficiency 06/05/2015  . Central sleep apnea 06/05/2015  . 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 Nerve root pain 06/05/2015  . Acquired polycythemia 06/05/2015  . Anterior knee pain 06/05/2015  . Dysmetabolic syndrome 61/44/3154  . Failure of erection 06/05/2015  . Gastro-esophageal reflux disease without esophagitis 06/05/2015  . Neuropathy ([place]) 06/05/2015  . CAD (coronary artery disease)   . Angina, class III ([place]) 05/23/2015  . Essential hypertension 05/23/2015  . Hyperlipidemia  05/23/2015  . Inguinal hernia without mention of obstruction or gangrene, recurrent unilateral or unspecified 03/24/2013    Past Surgical History:  Procedure Laterality Date  . BACK SURGERY  12/2008  . CARDIAC CATHETERIZATION N/A 05/30/2015   Procedure: Left Heart Cath;  Surgeon: Wellington Hampshire, MD;  Location: [facility];  Service: Cardiovascular;  Laterality: N/A;  . CARDIAC CATHETERIZATION N/A 04/16/2016   Procedure: Left Heart Cath and Coronary Angiography;  Surgeon: Wellington Hampshire, MD;  Location: [place] [facility];  Service: Cardiovascular;  Laterality: N/A;  . COLONOSCOPY  2014   Dr. [name]  . COLONOSCOPY WITH PROPOFOL N/A 12/25/2016   Procedure: COLONOSCOPY WITH PROPOFOL;  Surgeon: Jonathon Bellows, MD;  Location: [facility];  Service: Endoscopy;  Laterality: N/A;  . HERNIA REPAIR  1991  . INGUINAL HERNIA REPAIR Right 2012  . INGUINAL HERNIA REPAIR Right 2014    Family History  Problem Relation Age of Onset  . Heart Problems Mother   . Heart Problems Father 74    myocardial infarction  . Mental illness Brother     Social History   Social History  . Marital status: Single    Spouse name: N/A  . Number of children: N/A  . Years of education: N/A   Occupational History  . Not on file.   Social History Main Topics  . Smoking status: Former Smoker    Years: 2.00    Types: Cigars    Start date: 12/28/2002    Quit date: 06/04/2005  . Smokeless tobacco: Never Used  .  Alcohol use 0.6 oz/week    1 Glasses of wine per week  . Drug use: No  . Sexual activity: Yes    Partners: Female   Other Topics Concern  . Not on file   Social History Narrative  . No narrative on file     Current Outpatient Prescriptions:  .  aspirin 81 MG tablet, Take 81 mg by mouth daily., Disp: , Rfl:  .  atorvastatin (LIPITOR) 80 MG tablet, Take 1 tablet (80 mg total) by mouth daily., Disp: 30 tablet, Rfl: 5 .  carvedilol (COREG) 12.5 MG tablet, Take 1 tablet (12.5 mg total) by  mouth 2 (two) times daily., Disp: 60 tablet, Rfl: 3 .  cetirizine (ZYRTEC) 10 MG tablet, Take 10 mg by mouth daily. , Disp: , Rfl: 1 .  diclofenac sodium (VOLTAREN) 1 % GEL, Apply 4 g topically 4 (four) times daily., Disp: 100 g, Rfl: 2 .  FLECTOR 1.3 % PTCH, , Disp: , Rfl: 0 .  fluticasone (FLONASE) 50 MCG/ACT nasal spray, Place 2 sprays into the nose as needed., Disp: , Rfl:  .  metFORMIN (GLUCOPHAGE) 850 MG tablet, Take 1 tablet (850 mg total) by mouth 2 (two) times daily with a meal., Disp: 60 tablet, Rfl: 5 .  modafinil (PROVIGIL) 200 MG tablet, Take 1 tablet (200 mg total) by mouth daily., Disp: 30 tablet, Rfl: 0 .  NITROSTAT 0.4 MG SL tablet, place 1 tablet under the tongue every 5 minutes for UP TO 3 doses if needed for angina as directed by prescriber, Disp: 25 tablet, Rfl: 1 .  omeprazole (PRILOSEC) 20 MG capsule, Take 1 capsule (20 mg total) by mouth every morning., Disp: 30 capsule, Rfl: 5 .  polyethylene glycol powder (GLYCOLAX/MIRALAX) powder, Take 8 g by mouth as needed. For constipation, Disp: , Rfl: 0 .  RANEXA 1000 MG SR tablet, take 1 tablet by mouth twice a day, Disp: 60 tablet, Rfl: 5 .  Selenium Sulf-Pyrithione-Urea (SELENIUM SULFIDE) 2.25 % SHAM, , Disp: , Rfl: 0 .  Selenium Sulfide 2.25 % LOTN, Apply 5 mLs topically daily., Disp: 180 mL, Rfl: 2 .  sildenafil (REVATIO) 20 MG tablet, take 1-2 tablets by mouth once daily if needed, Disp: 60 tablet, Rfl: 2 .  valsartan (DIOVAN) 160 MG tablet, take 1 tablet by mouth once daily, Disp: 30 tablet, Rfl: 3  Allergies  Allergen Reactions  . Gabapentin     Groggy-Mood Changes  . Latex   . Penicillins Hives and Rash     ROS  Ten systems reviewed and is negative except as mentioned in HPI   Objective  Vitals:   01/22/17 1035  BP: 132/76  Pulse: 69  Resp: 18  Temp: 99.1 F (37.3 C)  TempSrc: Oral  SpO2: 93%  Weight: 252 lb 6.4 oz (114.5 kg)  Height: _0  (1.803 m)    Body mass index is 35.2 kg/m.  Physical  Exam  Constitutional: Patient appears well-developed and well-nourished. Obese  No distress.  HEENT: head atraumatic, normocephalic, pupils equal and reactive to light, ears normal TM, boggy turbinates, tender to palpation of on frontal and maxillary sinus, neck supple, throat within normal limits Cardiovascular: Normal rate, regular rhythm and normal heart sounds.  No murmur heard. No BLE edema. Pulmonary/Chest: Effort normal and breath sounds normal. No respiratory distress. Abdominal: Soft.  There is no tenderness. Psychiatric: Patient has a normal mood and affect. behavior is normal. Judgment and thought content normal.  Recent Results (from the past 2160  hour(s))  POCT HgB A1C     Status: None   Collection Time: 11/26/16  9:48 AM  Result Value Ref Range   Hemoglobin A1C 5.5   CBC with Differential/Platelet     Status: None   Collection Time: 12/17/16 10:28 AM  Result Value Ref Range   WBC 8.5 3.8 - 10.6 K/uL   RBC 5.07 4.40 - 5.90 MIL/uL   Hemoglobin 16.5 13.0 - 18.0 g/dL   HCT 47.8 40.0 - 52.0 %   MCV 94.3 80.0 - 100.0 fL   MCH 32.4 26.0 - 34.0 pg   MCHC 34.4 32.0 - 36.0 g/dL   RDW 13.7 11.5 - 14.5 %   Platelets 212 150 - 440 K/uL   Neutrophils Relative % 58 %   Neutro Abs 5.0 1.4 - 6.5 K/uL   Lymphocytes Relative 32 %   Lymphs Abs 2.7 1.0 - 3.6 K/uL   Monocytes Relative 8 %   Monocytes Absolute 0.7 0.2 - 1.0 K/uL   Eosinophils Relative 1 %   Eosinophils Absolute 0.1 0 - 0.7 K/uL   Basophils Relative 1 %   Basophils Absolute 0.0 0 - 0.1 K/uL  C-reactive protein     Status: None   Collection Time: 12/17/16 10:28 AM  Result Value Ref Range   CRP <0.8 <1.0 mg/dL    Comment: Performed at [facility] metabolic panel     Status: Abnormal   Collection Time: 12/17/16 10:28 AM  Result Value Ref Range   Sodium 139 135 - 145 mmol/L   Potassium 3.2 (L) 3.5 - 5.1 mmol/L   Chloride 105 101 - 111 mmol/L   CO2 24 22 - 32 mmol/L   Glucose, Bld 114 (H) 65 - 99 mg/dL    BUN 9 6 - 20 mg/dL   Creatinine, Ser 0.83 0.61 - 1.24 mg/dL   Calcium 9.1 8.9 - 10.3 mg/dL   GFR calc non Af Amer >60 >60 mL/min   GFR calc Af Amer >60 >60 mL/min    Comment: (NOTE) The eGFR has been calculated using the CKD EPI equation. This calculation has not been validated in all clinical situations. eGFR's persistently <60 mL/min signify possible Chronic Kidney Disease.    Anion gap 10 5 - 15  Hepatic function panel     Status: None   Collection Time: 12/17/16 10:28 AM  Result Value Ref Range   Total Protein 7.9 6.5 - 8.1 g/dL   Albumin 4.4 3.5 - 5.0 g/dL   AST 36 15 - 41 U/L   ALT 36 17 - 63 U/L   Alkaline Phosphatase 63 38 - 126 U/L   Total Bilirubin 0.7 0.3 - 1.2 mg/dL   Bilirubin, Direct 0.1 0.1 - 0.5 mg/dL   Indirect Bilirubin 0.6 0.3 - 0.9 mg/dL  Gastrointestinal Panel by PCR , Stool     Status: None   Collection Time: 12/18/16  7:00 AM  Result Value Ref Range   Campylobacter species NOT DETECTED NOT DETECTED   Plesimonas shigelloides NOT DETECTED NOT DETECTED   Salmonella species NOT DETECTED NOT DETECTED   Yersinia enterocolitica NOT DETECTED NOT DETECTED   Vibrio species NOT DETECTED NOT DETECTED   Vibrio cholerae NOT DETECTED NOT DETECTED   Enteroaggregative E coli (EAEC) NOT DETECTED NOT DETECTED   Enteropathogenic E coli (EPEC) NOT DETECTED NOT DETECTED   Enterotoxigenic E coli (ETEC) NOT DETECTED NOT DETECTED   Shiga like toxin producing E coli (STEC) NOT DETECTED NOT DETECTED   Shigella/Enteroinvasive  E coli (EIEC) NOT DETECTED NOT DETECTED   Cryptosporidium NOT DETECTED NOT DETECTED   Cyclospora cayetanensis NOT DETECTED NOT DETECTED   Entamoeba histolytica NOT DETECTED NOT DETECTED   Giardia lamblia NOT DETECTED NOT DETECTED   Adenovirus F40/41 NOT DETECTED NOT DETECTED   Astrovirus NOT DETECTED NOT DETECTED   Norovirus GI/GII NOT DETECTED NOT DETECTED   Rotavirus A NOT DETECTED NOT DETECTED   Sapovirus (I, II, IV, and V) NOT DETECTED NOT DETECTED   C difficile quick scan w PCR reflex     Status: None   Collection Time: 12/18/16  7:00 AM  Result Value Ref Range   C Diff antigen NEGATIVE NEGATIVE   C Diff toxin NEGATIVE NEGATIVE   C Diff interpretation No C. difficile detected.   Fecal lactoferrin     Status: None   Collection Time: 12/18/16  7:00 AM  Result Value Ref Range   Lactoferrin, Fecal, Quant. <1.00 0.00 - 7.24 ug/mL(g)    Comment: (NOTE) **Results verified by repeat testing**                        Baseline (normal)  0.00 - 7.24                        Elevated                 >7.24 An elevated result is indicative of the presence of fecal lactoferrin, a marker of intestinal inflammation. A normal result does not exclude the presence of intestinal inflammation. The test can be used as an in vitro diagnostic aid to distinguish patients with active inflammatory bowel disease (IBD) from those with non-inflammatory irritable bowel syndrome (IBS). Performed At: [facility] [place], [place] [id number] Lindon Romp MD VO:[id number]   Glucose, capillary     Status: Abnormal   Collection Time: 12/25/16  8:57 AM  Result Value Ref Range   Glucose-Capillary 121 (H) 65 - 99 mg/dL   Comment 1 Document in Chart   HM DIABETES EYE EXAM     Status: None   Collection Time: 01/05/17 12:00 AM  Result Value Ref Range   HM Diabetic Eye Exam No Retinopathy No Retinopathy    Comment: [facility]-Dr. Annamaria Helling    PHQ2/9: Depression screen [facility] 2/9 11/26/2016 07/10/2016 03/05/2016 12/05/2015 09/05/2015  Decreased Interest 0 0 0 0 0  Down, Depressed, Hopeless 0 0 0 0 0  PHQ - 2 Score 0 0 0 0 0     Fall Risk: Fall Risk  11/26/2016 07/10/2016 03/05/2016 12/05/2015 09/05/2015  Falls in the past year? No No Yes Yes No  Number falls in past yr: - - 2 or more 2 or more -  Injury with Fall? - - Yes Yes -    Assessment & Plan  1. Fever and chills  - POCT Influenza A/B  2. Body aches  - POCT Influenza  A/B  3. Cough  - chlorpheniramine-HYDROcodone (TUSSIONEX PENNKINETIC ER) 10-8 MG/5ML SUER; Take 5 mLs by mouth every 12 (twelve) hours as needed.  Dispense: 140 mL; Refill: 0  4. Viral sinusitis  Advised fluids, rest, resume nasal spray  5. Controlled type 2 diabetes with neuropathy (HCC)  Controlled, URI is not causing glucose to go up, continue current regiment

## 2017-01-26 ENCOUNTER — Encounter: Payer: Self-pay | Admitting: Family Medicine

## 2017-01-29 ENCOUNTER — Encounter
Admission: RE | Disposition: A | Discharge status: Home / Self Care | Payer: Self-pay | Source: Ambulatory Visit | Attending: Gastroenterology

## 2017-01-29 ENCOUNTER — Ambulatory Visit: Payer: Medicaid Other | Admitting: Anesthesiology

## 2017-01-29 ENCOUNTER — Ambulatory Visit
Admission: RE | Admit: 2017-01-29 | Discharge: 2017-01-29 | Disposition: A | Discharge status: Home / Self Care | Payer: Medicaid Other | Source: Ambulatory Visit | Attending: Gastroenterology | Admitting: Gastroenterology

## 2017-01-29 DIAGNOSIS — K625 Hemorrhage of anus and rectum: Secondary | ICD-10-CM | POA: Insufficient documentation

## 2017-01-29 DIAGNOSIS — M1990 Unspecified osteoarthritis, unspecified site: Secondary | ICD-10-CM | POA: Insufficient documentation

## 2017-01-29 DIAGNOSIS — Z79899 Other long term (current) drug therapy: Secondary | ICD-10-CM | POA: Insufficient documentation

## 2017-01-29 DIAGNOSIS — D123 Benign neoplasm of transverse colon: Secondary | ICD-10-CM | POA: Insufficient documentation

## 2017-01-29 DIAGNOSIS — I10 Essential (primary) hypertension: Secondary | ICD-10-CM | POA: Diagnosis not present

## 2017-01-29 DIAGNOSIS — K640 First degree hemorrhoids: Secondary | ICD-10-CM | POA: Insufficient documentation

## 2017-01-29 DIAGNOSIS — K219 Gastro-esophageal reflux disease without esophagitis: Secondary | ICD-10-CM | POA: Diagnosis not present

## 2017-01-29 DIAGNOSIS — E669 Obesity, unspecified: Secondary | ICD-10-CM | POA: Insufficient documentation

## 2017-01-29 DIAGNOSIS — G4730 Sleep apnea, unspecified: Secondary | ICD-10-CM | POA: Insufficient documentation

## 2017-01-29 DIAGNOSIS — E119 Type 2 diabetes mellitus without complications: Secondary | ICD-10-CM | POA: Insufficient documentation

## 2017-01-29 DIAGNOSIS — D122 Benign neoplasm of ascending colon: Secondary | ICD-10-CM | POA: Diagnosis not present

## 2017-01-29 DIAGNOSIS — E785 Hyperlipidemia, unspecified: Secondary | ICD-10-CM | POA: Insufficient documentation

## 2017-01-29 DIAGNOSIS — I2510 Atherosclerotic heart disease of native coronary artery without angina pectoris: Secondary | ICD-10-CM | POA: Diagnosis not present

## 2017-01-29 DIAGNOSIS — Z87891 Personal history of nicotine dependence: Secondary | ICD-10-CM | POA: Insufficient documentation

## 2017-01-29 DIAGNOSIS — Z7984 Long term (current) use of oral hypoglycemic drugs: Secondary | ICD-10-CM | POA: Insufficient documentation

## 2017-01-29 HISTORY — PX: COLONOSCOPY WITH PROPOFOL: SHX5780

## 2017-01-29 SURGERY — COLONOSCOPY WITH PROPOFOL
Anesthesia: General

## 2017-01-29 MED ORDER — FENTANYL CITRATE (PF) 100 MCG/2ML IJ SOLN
INTRAMUSCULAR | Status: DC | PRN
  Administered 2017-01-29 (×2): 50 ug via INTRAVENOUS

## 2017-01-29 MED ORDER — SODIUM CHLORIDE 0.9 % IV SOLN
INTRAVENOUS | Status: DC
  Administered 2017-01-29: 09:00:00 via INTRAVENOUS

## 2017-01-29 MED ORDER — MIDAZOLAM HCL 2 MG/2ML IJ SOLN
INTRAMUSCULAR | Status: AC
  Filled 2017-01-29: qty 2

## 2017-01-29 MED ORDER — FENTANYL CITRATE (PF) 100 MCG/2ML IJ SOLN
INTRAMUSCULAR | Status: AC
  Filled 2017-01-29: qty 2

## 2017-01-29 MED ORDER — MIDAZOLAM HCL 2 MG/2ML IJ SOLN
INTRAMUSCULAR | Status: DC | PRN
  Administered 2017-01-29: 2 mg via INTRAVENOUS

## 2017-01-29 MED ORDER — PROPOFOL 500 MG/50ML IV EMUL
INTRAVENOUS | Status: AC
  Filled 2017-01-29: qty 50

## 2017-01-29 MED ORDER — PROPOFOL 500 MG/50ML IV EMUL
INTRAVENOUS | Status: DC | PRN
  Administered 2017-01-29: 120 ug/kg/min via INTRAVENOUS

## 2017-01-29 NOTE — Anesthesia Post-op Follow-up Note (Cosign Needed)
Anesthesia QCDR form completed.        

## 2017-01-29 NOTE — Anesthesia Procedure Notes (Signed)
Performed by: COOK-MARTIN, Zac Torti Pre-anesthesia Checklist: Patient identified, Emergency Drugs available, Suction available, Patient being monitored and Timeout performed Patient Re-evaluated:Patient Re-evaluated prior to inductionOxygen Delivery Method: Simple face mask Preoxygenation: Pre-oxygenation with 100% oxygen Intubation Type: IV induction Placement Confirmation: positive ETCO2 and CO2 detector       

## 2017-01-29 NOTE — Anesthesia Postprocedure Evaluation (Signed)
Anesthesia Post Note  Patient: [name] [name]  Procedure(s) Performed: Procedure(s) (LRB): COLONOSCOPY WITH PROPOFOL (N/A)  Patient location during evaluation: Endoscopy Anesthesia Type: General Level of consciousness: awake and alert and oriented Pain management: pain level controlled Vital Signs Assessment: post-procedure vital signs reviewed and stable Respiratory status: spontaneous breathing, nonlabored ventilation and respiratory function stable Cardiovascular status: blood pressure returned to baseline and stable Postop Assessment: no signs of nausea or vomiting Anesthetic complications: no     Last Vitals:  Vitals:   01/29/17 0920 01/29/17 0930  BP:  (!) 128/108  Pulse:  70  Resp:  16  Temp: (!) (P) 35.7 C     Last Pain:  Vitals:   01/29/17 0920  TempSrc: (P) Tympanic                 Makana Feigel

## 2017-01-29 NOTE — H&P (Signed)
Jonathon Bellows MD [address] [place], [place] [address] Phone: [phone] Fax : [phone]  Primary Care Physician:  [name], MD Primary Gastroenterologist:  Dr. Jonathon Bellows   Pre-Procedure History & Physical: HPI:  [name] [name] is a 56 y.o. male is here for an colonoscopy.   Past Medical History:  Diagnosis Date  . Allergy   . CAD (coronary artery disease)    a. cardiac cath 05/30/2015: LM nl, mLAD 50%, LCx minor irregs, RCA minor irregs, EF 55-65%, no WMA, no MR/AS, nl LVEDP, recommend aggressive med Rx  . Degeneration of intervertebral disc of cervical region   . Diabetes mellitus without complication ([place])   . Hernia 1991   02/10/2012-RIH repair  . Hyperlipidemia   . Hypertension 2008  . Inguinal hernia without mention of obstruction or gangrene, recurrent unilateral or unspecified 2012  . Obesity, unspecified 2012  . Personal history of tobacco use, presenting hazards to health 2012  . Sleep apnea   . Umbilical hernia without mention of obstruction or gangrene 2012    Past Surgical History:  Procedure Laterality Date  . BACK SURGERY  12/2008  . CARDIAC CATHETERIZATION N/A 05/30/2015   Procedure: Left Heart Cath;  Surgeon: Wellington Hampshire, MD;  Location: [place] [facility];  Service: Cardiovascular;  Laterality: N/A;  . CARDIAC CATHETERIZATION N/A 04/16/2016   Procedure: Left Heart Cath and Coronary Angiography;  Surgeon: Wellington Hampshire, MD;  Location: [place] [facility];  Service: Cardiovascular;  Laterality: N/A;  . COLONOSCOPY  2014   Dr. [name]  . COLONOSCOPY WITH PROPOFOL N/A 12/25/2016   Procedure: COLONOSCOPY WITH PROPOFOL;  Surgeon: Jonathon Bellows, MD;  Location: [facility];  Service: Endoscopy;  Laterality: N/A;  . HERNIA REPAIR  1991  . INGUINAL HERNIA REPAIR Right 2012  . INGUINAL HERNIA REPAIR Right 2014    Prior to Admission medications   Medication Sig Start Date End Date Taking? Authorizing Provider  FLECTOR 1.3 % [facility]   12/07/16  Yes Historical Provider, MD  fluticasone (FLONASE) 50 MCG/ACT nasal spray Place 2 sprays into the nose as needed. 03/05/15  Yes Historical Provider, MD  NITROSTAT 0.4 MG SL tablet place 1 tablet under the tongue every 5 minutes for UP TO 3 doses if needed for angina as directed by prescriber 01/15/17  Yes Wellington Hampshire, MD  sildenafil (REVATIO) 20 MG tablet take 1-2 tablets by mouth once daily if needed 11/24/16  Yes Steele Sizer, MD  aspirin 81 MG tablet Take 81 mg by mouth daily.    Historical Provider, MD  atorvastatin (LIPITOR) 80 MG tablet Take 1 tablet (80 mg total) by mouth daily. 11/26/16   Steele Sizer, MD  carvedilol (COREG) 12.5 MG tablet Take 1 tablet (12.5 mg total) by mouth 2 (two) times daily. 05/04/16   Minna Merritts, MD  cetirizine (ZYRTEC) 10 MG tablet Take 10 mg by mouth daily.  07/09/16   Historical Provider, MD  chlorpheniramine-HYDROcodone (TUSSIONEX PENNKINETIC ER) 10-8 MG/5ML SUER Take 5 mLs by mouth every 12 (twelve) hours as needed. Patient not taking: Reported on 01/29/2017 01/22/17   Steele Sizer, MD  diclofenac sodium (VOLTAREN) 1 % GEL Apply 4 g topically 4 (four) times daily. 11/27/16   Steele Sizer, MD  metFORMIN (GLUCOPHAGE) 850 MG tablet Take 1 tablet (850 mg total) by mouth 2 (two) times daily with a meal. 11/26/16   Steele Sizer, MD  modafinil (PROVIGIL) 200 MG tablet Take 1 tablet (200 mg total) by mouth  daily. 12/04/16   Steele Sizer, MD  omeprazole (PRILOSEC) 20 MG capsule Take 1 capsule (20 mg total) by mouth every morning. 11/26/16   Steele Sizer, MD  polyethylene glycol powder (GLYCOLAX/MIRALAX) powder Take 8 g by mouth as needed. For constipation 03/05/15   Historical Provider, MD  RANEXA 1000 MG SR tablet take 1 tablet by mouth twice a day 06/05/16   Wellington Hampshire, MD  Selenium Sulf-Pyrithione-Urea (SELENIUM SULFIDE) 2.25 % SHAM  11/27/16   Historical Provider, MD  Selenium Sulfide 2.25 % LOTN Apply 5 mLs topically daily. 11/26/16    Steele Sizer, MD  valsartan (DIOVAN) 160 MG tablet take 1 tablet by mouth once daily 12/03/16   Wellington Hampshire, MD    Allergies as of 01/15/2017 - Review Complete 12/25/2016  Allergen Reaction Noted  . Gabapentin  04/16/2015  . Latex  06/20/2015  . Penicillins Hives and Rash 03/23/2013    Family History  Problem Relation Age of Onset  . Heart Problems Mother   . Heart Problems Father 44    myocardial infarction  . Mental illness Brother     Social History   Social History  . Marital status: Single    Spouse name: N/A  . Number of children: N/A  . Years of education: N/A   Occupational History  . Not on file.   Social History Main Topics  . Smoking status: Former Smoker    Years: 2.00    Types: Cigars    Start date: 12/28/2002    Quit date: 06/04/2005  . Smokeless tobacco: Never Used  . Alcohol use 0.6 oz/week    1 Glasses of wine per week  . Drug use: No  . Sexual activity: Yes    Partners: Female   Other Topics Concern  . Not on file   Social History Narrative  . No narrative on file    Review of Systems: See HPI, otherwise negative ROS  Physical Exam: BP (!) 158/94   Pulse 74   Temp 97.1 F (36.2 C) (Tympanic)   Resp (!) 22   Ht 5\' 11"  (1.803 m)   Wt 249 lb (112.9 kg)   SpO2 96%   BMI 34.73 kg/m  General:   Alert,  pleasant and cooperative in NAD Head:  Normocephalic and atraumatic. Neck:  Supple; no masses or thyromegaly. Lungs:  Clear throughout to auscultation.    Heart:  Regular rate and rhythm. Abdomen:  Soft, nontender and nondistended. Normal bowel sounds, without guarding, and without rebound.   Neurologic:  Alert and  oriented x4;  grossly normal neurologically.  Impression/Plan: [name] [name] is here for an colonoscopy to be performed for rectal bleeding   Risks, benefits, limitations, and alternatives regarding  colonoscopy have been reviewed with the patient.  Questions have been answered.  All parties agreeable.   Jonathon Bellows, MD  01/29/2017, 8:36 AM

## 2017-01-29 NOTE — Op Note (Signed)
[facility] Patient Name: [name] [name] Procedure Date: 01/29/2017 8:39 AM MRN: [id number] Account #: [id number] Date of Birth: [birth year] Admit Type: Outpatient Age: 56 Room: [facility] ENDO ROOM 4 Gender: Male Note Status: Finalized Procedure:            Colonoscopy Indications:          Rectal bleeding Providers:            Jonathon Bellows MD, MD Referring MD:         Bethena Roys. Sowles, MD (Referring MD) Medicines:            Monitored Anesthesia Care Complications:        No immediate complications. Procedure:            Pre-Anesthesia Assessment:                       - Prior to the procedure, a History and Physical was                        performed, and patient medications, allergies and                        sensitivities were reviewed. The patient's tolerance of                        previous anesthesia was reviewed.                       - The risks and benefits of the procedure and the                        sedation options and risks were discussed with the                        patient. All questions were answered and informed                        consent was obtained.                       - The risks and benefits of the procedure and the                        sedation options and risks were discussed with the                        patient. All questions were answered and informed                        consent was obtained.                       - ASA Grade Assessment: III - A patient with severe                        systemic disease.                       After obtaining informed consent, the colonoscope was  passed under direct vision. Throughout the procedure,                        the patient's blood pressure, pulse, and oxygen                        saturations were monitored continuously. The                        Colonoscope was introduced through the anus and                        advanced to the the  cecum, identified by the                        appendiceal orifice, IC valve and transillumination.                        The colonoscopy was performed with ease. The patient                        tolerated the procedure well. The quality of the bowel                        preparation was excellent. Findings:      The perianal and digital rectal examinations were normal.      Three sessile polyps were found in the transverse colon. The polyps were       5 to 7 mm in size. These polyps were removed with a cold snare.       Resection and retrieval were complete.      A 3 mm polyp was found in the ascending colon. The polyp was sessile.       The polyp was removed with a cold biopsy forceps. Resection and       retrieval were complete.      Non-bleeding internal hemorrhoids were found during retroflexion. The       hemorrhoids were large and Grade I (internal hemorrhoids that do not       prolapse).      The exam was otherwise without abnormality on direct and retroflexion       views. Impression:           - Three 5 to 7 mm polyps in the transverse colon,                        removed with a cold snare. Resected and retrieved.                       - One 3 mm polyp in the ascending colon, removed with a                        cold biopsy forceps. Resected and retrieved.                       - Non-bleeding internal hemorrhoids.                       - The examination was otherwise normal on direct and  retroflexion views. Recommendation:       - Discharge patient to home (with escort).                       - Resume previous diet.                       - Continue present medications.                       - High fiber diet.                       - Continue present medications.                       - Await pathology results.                       - Repeat colonoscopy in 3 - 5 years for surveillance of                        multiple polyps. Procedure Code(s):     --- Professional ---                       [phone], Colonoscopy, flexible; with removal of tumor(s),                        polyp(s), or other lesion(s) by snare technique                       45380, 54, Colonoscopy, flexible; with biopsy, single                        or multiple Diagnosis Code(s):    --- Professional ---                       D12.3, Benign neoplasm of transverse colon (hepatic                        flexure or splenic flexure)                       D12.2, Benign neoplasm of ascending colon                       K64.0, First degree hemorrhoids                       K62.5, Hemorrhage of anus and rectum CPT copyright 2016 American Medical Association. All rights reserved. The codes documented in this report are preliminary and upon coder review may  be revised to meet current compliance requirements. Jonathon Bellows, MD Jonathon Bellows MD, MD 01/29/2017 9:17:17 AM This report has been signed electronically. Number of Addenda: 0 Note Initiated On: 01/29/2017 8:39 AM Scope Withdrawal Time: 0 hours 18 minutes 39 seconds  Total Procedure Duration: 0 hours 21 minutes 34 seconds       [facility]

## 2017-01-29 NOTE — Anesthesia Preprocedure Evaluation (Signed)
Anesthesia Evaluation  Patient identified by MRN, date of birth, ID band Patient awake    Reviewed: Allergy & Precautions, NPO status , Patient's Chart, lab work & pertinent test results  History of Anesthesia Complications Negative for: history of anesthetic complications  Airway Mallampati: I  TM Distance: >3 FB Neck ROM: Full    Dental  (+) Implants   Pulmonary sleep apnea , neg COPD, former smoker,    breath sounds clear to auscultation- rhonchi (-) wheezing      Cardiovascular hypertension, Pt. on medications + angina (last used NTG several months ago) + CAD (nonocclusive on last cath)   Rhythm:Regular Rate:Normal - Systolic murmurs and - Diastolic murmurs    Neuro/Psych negative neurological ROS  negative psych ROS   GI/Hepatic Neg liver ROS, GERD  ,  Endo/Other  diabetes, Oral Hypoglycemic Agents  Renal/GU negative Renal ROS     Musculoskeletal  (+) Arthritis ,   Abdominal (+) + obese,   Peds  Hematology negative hematology ROS (+)   Anesthesia Other Findings Past Medical History: No date: Allergy No date: CAD (coronary artery disease)     Comment: a. cardiac cath 05/30/2015: LM nl, mLAD 50%, LCx              minor irregs, RCA minor irregs, EF 55-65%, no               WMA, no MR/AS, nl LVEDP, recommend aggressive               med Rx No date: Degeneration of intervertebral disc of cervica* No date: Diabetes mellitus without complication ([place]) 99991111: Hernia     Comment: 02/10/2012-RIH repair No date: Hyperlipidemia 2008: Hypertension 2012: Inguinal hernia without mention of obstruction* 2012: Obesity, unspecified 2012: Personal history of tobacco use, presenting ha* No date: Sleep apnea 0000000: Umbilical hernia without mention of obstructio*   Reproductive/Obstetrics                             Anesthesia Physical Anesthesia Plan  ASA: III  Anesthesia Plan: General    Post-op Pain Management:    Induction: Intravenous  Airway Management Planned: Natural Airway  Additional Equipment:   Intra-op Plan:   Post-operative Plan:   Informed Consent: I have reviewed the patients History and Physical, chart, labs and discussed the procedure including the risks, benefits and alternatives for the proposed anesthesia with the patient or authorized representative who has indicated his/her understanding and acceptance.   Dental advisory given  Plan Discussed with: CRNA and Anesthesiologist  Anesthesia Plan Comments:         Anesthesia Quick Evaluation

## 2017-01-29 NOTE — Transfer of Care (Signed)
Immediate Anesthesia Transfer of Care Note  Patient: [name] [name]  Procedure(s) Performed: Procedure(s): COLONOSCOPY WITH PROPOFOL (N/A)  Patient Location: PACU  Anesthesia Type:General  Level of Consciousness: awake, alert  and oriented  Airway & Oxygen Therapy: Patient Spontanous Breathing and Patient connected to nasal cannula oxygen  Post-op Assessment: Report given to RN and Post -op Vital signs reviewed and stable  Post vital signs: Reviewed and stable  Last Vitals:  Vitals:   01/29/17 0827  BP: (!) 158/94  Pulse: 74  Resp: (!) 22  Temp: 36.2 C    Last Pain:  Vitals:   01/29/17 0827  TempSrc: Tympanic         Complications: No apparent anesthesia complications

## 2017-02-01 ENCOUNTER — Encounter: Payer: Self-pay | Admitting: Gastroenterology

## 2017-02-01 LAB — SURGICAL PATHOLOGY

## 2017-02-03 ENCOUNTER — Telehealth: Payer: Self-pay

## 2017-02-03 ENCOUNTER — Other Ambulatory Visit: Payer: Self-pay

## 2017-02-03 MED ORDER — HYDROCORTISONE ACETATE 25 MG RE SUPP: 25.0000 mg | Freq: Two times a day (BID) | RECTAL | 1 refills | Status: DC

## 2017-02-03 NOTE — Telephone Encounter (Signed)
Pt had his colonoscopy on 01/29/17. He is still having rectal bleeding even without having a bowel movement. He woke up this morning and his pajamas were wet with blood. He is also vomiting with a slight fever.

## 2017-02-04 NOTE — Telephone Encounter (Signed)
Trial of anusol if already tried and failed then refer for banding of hemorroids

## 2017-02-08 ENCOUNTER — Telehealth: Payer: Self-pay

## 2017-02-08 NOTE — Telephone Encounter (Signed)
Patient called last week because he was having bloody stools.  He is calling today because what you gave him is not working. Patient is still having bloody stools, everything he eats makes him go to the bathroom, and he is still getting a sharp pain in his abdomen. Please call patient and advice.

## 2017-02-08 NOTE — Telephone Encounter (Signed)
Dr. Vicente Males, please advise. We sent him some suppositories on Friday. Should he give it some more time or schedule a follow to discuss diarrhea?

## 2017-02-08 NOTE — Telephone Encounter (Signed)
Continue suppositories and refer to colorectal for banding of hemorroids. Advise to keep stools soft with colace or miralax. Trial of bentyl 10 mg TID before meals for pains. No NSAID's .

## 2017-02-10 ENCOUNTER — Other Ambulatory Visit: Payer: Self-pay

## 2017-02-10 MED ORDER — DICYCLOMINE HCL 10 MG PO CAPS: 10.0000 mg | ORAL_CAPSULE | Freq: Three times a day (TID) | ORAL | 1 refills | Status: DC

## 2017-02-10 NOTE — Telephone Encounter (Signed)
Pt has been scheduled with [facility] on Thursday, Feb 15th at 3:15pm. Pt has been notified of appt with Dr. Hampton Abbot.

## 2017-02-10 NOTE — Telephone Encounter (Signed)
Pt notified of recommendation from Dr. Vicente Males. Rx sent to pharmacy. Requested a consultation appt with [facility].

## 2017-02-11 ENCOUNTER — Ambulatory Visit (INDEPENDENT_AMBULATORY_CARE_PROVIDER_SITE_OTHER): Payer: Medicaid Other | Admitting: Surgery

## 2017-02-11 ENCOUNTER — Encounter: Payer: Self-pay | Admitting: Surgery

## 2017-02-11 ENCOUNTER — Other Ambulatory Visit: Payer: Self-pay | Admitting: Family Medicine

## 2017-02-11 VITALS — BP 159/96 | HR 64 | Temp 97.8°F | Ht 71.0 in | Wt 255.0 lb

## 2017-02-11 DIAGNOSIS — K625 Hemorrhage of anus and rectum: Secondary | ICD-10-CM | POA: Insufficient documentation

## 2017-02-11 HISTORY — DX: Hemorrhage of anus and rectum: K62.5

## 2017-02-11 NOTE — Progress Notes (Signed)
02/11/2017  Reason for Visit:  Rectal bleeding  History of Present Illness: [name] [name] is a 56 y.o. male who presents with a history of abdominal pain, diarrhea, and rectal bleeding.  He had been referred to Dr. Vicente Males who did a colonoscopy about two weeks ago, revealing polyps and non-bleeding internal hemorrhoids.  However, the patient reports that he is still having daily bleeding, bright red in nature, with food "running through" him after meals, along with abdominal discomfort.  He was referred for possible hemorrhoidectomy.  He notices the blood in the tissue paper when he wipes as well as in the stool.  He may have blood first, then stool, then more blood when he wipes.  He reports that sometimes he will wake up and there is blood on the sheets. He also reports losing 10 lbs in the past 10 days and feels that he cant eat because food runs through him.  However, he does not necessarily have diarrhea, as he may have formed stool as well.  Before the colonoscopy, he reports having occasional bleeding, but afterwards, he has been having bleeding daily.  He is very concerned and wants to figure out what is the cause.   Past Medical History: Past Medical History:  Diagnosis Date  . Allergy   . Angina, class III ([place])   . CAD (coronary artery disease)    a. cardiac cath 05/30/2015: LM nl, mLAD 50%, LCx minor irregs, RCA minor irregs, EF 55-65%, no WMA, no MR/AS, nl LVEDP, recommend aggressive med Rx  . Chronic constipation   . Chronic low blood pressure   . Degeneration of intervertebral disc of cervical region   . Diabetes mellitus without complication ([place])   . GERD (gastroesophageal reflux disease)   . Hernia 1991   02/10/2012-RIH repair  . Hyperlipidemia   . Hypertension 2008  . Inguinal hernia without mention of obstruction or gangrene, recurrent unilateral or unspecified 2012  . Nerve root pain   . Neuropathy ([place])   . Obesity, unspecified 2012  . Personal history of tobacco use,  presenting hazards to health 2012  . Polycythemia   . Sleep apnea   . Umbilical hernia without mention of obstruction or gangrene 2012  . Vitamin D deficiency      Past Surgical History: Past Surgical History:  Procedure Laterality Date  . BACK SURGERY  12/2008  . CARDIAC CATHETERIZATION N/A 05/30/2015   Procedure: Left Heart Cath;  Surgeon: Wellington Hampshire, MD;  Location: [facility];  Service: Cardiovascular;  Laterality: N/A;  . CARDIAC CATHETERIZATION N/A 04/16/2016   Procedure: Left Heart Cath and Coronary Angiography;  Surgeon: Wellington Hampshire, MD;  Location: [facility];  Service: Cardiovascular;  Laterality: N/A;  . COLONOSCOPY  2014   Dr. [name]  . COLONOSCOPY WITH PROPOFOL N/A 12/25/2016   Procedure: COLONOSCOPY WITH PROPOFOL;  Surgeon: Jonathon Bellows, MD;  Location: [facility] ENDOSCOPY;  Service: Endoscopy;  Laterality: N/A;  . COLONOSCOPY WITH PROPOFOL N/A 01/29/2017   Procedure: COLONOSCOPY WITH PROPOFOL;  Surgeon: Jonathon Bellows, MD;  Location: [facility] ENDOSCOPY;  Service: Endoscopy;  Laterality: N/A;  . HERNIA REPAIR  1991  . INGUINAL HERNIA REPAIR Right 2012  . INGUINAL HERNIA REPAIR Right 2014    Home Medications: Prior to Admission medications   Medication Sig Start Date End Date Taking? Authorizing Provider  aspirin 81 MG tablet Take 81 mg by mouth daily.   Yes Historical Provider, MD  atorvastatin (LIPITOR) 80 MG tablet Take 1 tablet (  80 mg total) by mouth daily. 11/26/16  Yes Steele Sizer, MD  carvedilol (COREG) 12.5 MG tablet Take 1 tablet (12.5 mg total) by mouth 2 (two) times daily. 05/04/16  Yes Minna Merritts, MD  cetirizine (ZYRTEC) 10 MG tablet Take 10 mg by mouth daily.  07/09/16  Yes Historical Provider, MD  chlorpheniramine-HYDROcodone (TUSSIONEX PENNKINETIC ER) 10-8 MG/5ML SUER Take 5 mLs by mouth every 12 (twelve) hours as needed. 01/22/17  Yes Steele Sizer, MD  diclofenac sodium (VOLTAREN) 1 % GEL Apply 4 g topically 4 (four) times daily. 11/27/16   Yes Steele Sizer, MD  dicyclomine (BENTYL) 10 MG capsule Take 1 capsule (10 mg total) by mouth 3 (three) times daily before meals. 02/10/17  Yes Jonathon Bellows, MD  FLECTOR 1.3 % [facility]  12/07/16  Yes Historical Provider, MD  fluticasone (FLONASE) 50 MCG/ACT nasal spray Place 2 sprays into the nose as needed. 03/05/15  Yes Historical Provider, MD  hydrocortisone (ANUSOL-HC) 25 MG suppository Place 1 suppository (25 mg total) rectally 2 (two) times daily. 02/03/17  Yes Jonathon Bellows, MD  metFORMIN (GLUCOPHAGE) 850 MG tablet Take 1 tablet (850 mg total) by mouth 2 (two) times daily with a meal. 11/26/16  Yes Steele Sizer, MD  modafinil (PROVIGIL) 200 MG tablet Take 1 tablet (200 mg total) by mouth daily. 12/04/16  Yes Steele Sizer, MD  NITROSTAT 0.4 MG SL tablet place 1 tablet under the tongue every 5 minutes for UP TO 3 doses if needed for angina as directed by prescriber 01/15/17  Yes Wellington Hampshire, MD  omeprazole (PRILOSEC) 20 MG capsule Take 1 capsule (20 mg total) by mouth every morning. 11/26/16  Yes Steele Sizer, MD  polyethylene glycol powder (GLYCOLAX/MIRALAX) powder Take 8 g by mouth as needed. For constipation 03/05/15  Yes Historical Provider, MD  RANEXA 1000 MG SR tablet take 1 tablet by mouth twice a day 06/05/16  Yes Wellington Hampshire, MD  Selenium Sulf-Pyrithione-Urea (SELENIUM SULFIDE) 2.25 % SHAM  11/27/16  Yes Historical Provider, MD  Selenium Sulfide 2.25 % LOTN Apply 5 mLs topically daily. 11/26/16  Yes Steele Sizer, MD  sildenafil (REVATIO) 20 MG tablet take 1-2 tablets by mouth once daily if needed 02/11/17  Yes Steele Sizer, MD  valsartan (DIOVAN) 160 MG tablet take 1 tablet by mouth once daily 12/03/16  Yes Wellington Hampshire, MD    Allergies: Allergies  Allergen Reactions  . Gabapentin Other (See Comments)    Groggy-Mood Changes  . Latex Rash  . Penicillins Hives and Rash    Social History:  reports that he quit smoking about 11 years ago. His smoking use included Cigars. He  started smoking about 14 years ago. He quit after 2.00 years of use. He has never used smokeless tobacco. He reports that he drinks about 0.6 oz of alcohol per week . He reports that he does not use drugs.   Family History: Family History  Problem Relation Age of Onset  . Heart Problems Mother   . Heart Problems Father 71    myocardial infarction  . Mental illness Brother     Review of Systems: Review of Systems  Constitutional: Negative for chills and fever.  HENT: Negative for hearing loss.   Eyes: Negative for blurred vision.  Respiratory: Negative for cough and shortness of breath.   Cardiovascular: Negative for chest pain and leg swelling.  Gastrointestinal: Positive for abdominal pain and blood in stool. Negative for heartburn, nausea and vomiting.  Genitourinary: Negative for dysuria  and hematuria.  Musculoskeletal: Negative for myalgias.  Skin: Negative for rash.  Neurological: Negative for dizziness.  Psychiatric/Behavioral: Negative for depression.  All other systems reviewed and are negative.   Physical Exam BP (!) 159/96   Pulse 64   Temp 97.8 F (36.6 C) (Oral)   Ht 5\' 11"  (1.803 m)   Wt 115.7 kg (255 lb)   BMI 35.57 kg/m  CONSTITUTIONAL: No acute distress HEENT:  Normocephalic, atraumatic, extraocular motion intact. NECK: Trachea is midline, and there is no jugular venous distension.  RESPIRATORY:  Lungs are clear, and breath sounds are equal bilaterally. Normal respiratory effort without pathologic use of accessory muscles. CARDIOVASCULAR: Heart is regular without murmurs, gallops, or rubs. GI: The abdomen is soft, nondistended, nontender. There were no palpable masses.  RECTAL:  No perianal lesions or tears.  No palpable masses or abscess.  On rectal exam, there is appropriate rectal tone.  No masses palpable.  No protrusion of tissue when patient bearing down.  Right posterior hemorrhoid column possibly enlarged but nontender.  No bleeding on  glove. MUSCULOSKELETAL:  Normal muscle strength and tone in all four extremities.  No peripheral edema or cyanosis. SKIN: Skin turgor is normal. There are no pathologic skin lesions.  NEUROLOGIC:  Motor and sensation is grossly normal.  Cranial nerves are grossly intact. PSYCH:  Alert and oriented to person, place and time. Affect is normal.  Laboratory Analysis: No results found for this or any previous visit (from the past 24 hour(s)).  Imaging: No results found.  Assessment and Plan: This is a 56 y.o. male who presents with history of rectal bleeding, with enlarged internal hemorrhoids.  Unclear currently if the patient may have hemorrhoidal bleeding or not.  On exam, there was no gross blood and no blood on glove after rectal exam.  There is no anoscope available in clinic and thus unable to see the full extent of possible issues.    Have discussed with the patient different recommendations for possible hemorrhoidal bleeding.  Recommended that he start Psyllium Konsyl fiber once daily as well as Sitz baths.  Would also plan for an Exam under Anesthesia in the Operating room to better evaluate for his source of bleeding.  Discussed with the patient that if indeed this is hemorrhoid in nature, then would proceed with hemorrhoidectomy.  Risks and benefits of the procedure were discussed with the patient and he has given informed consent.  He will be booked for OR on 2/26.  He is aware that if there is no further bleeding, he may cancer the surgery at any time.   Melvyn Neth, [place]

## 2017-02-11 NOTE — Patient Instructions (Addendum)
Please start taking psyllium konsyl  fiber once a day. You are able to buy this at any pharmacy.  I need you to drink plenty of water.  I need for you to do sitz baths at least twice a day.  On February 26 th, I need you to do an enema 4 hours prior to your procedure.  Please stop your Aspirin 5 days before your procedure.  If you have any questions or concerns, please do not hesitate to give [place] a call.  Please look at your blue sheet in case you have surgery questions.

## 2017-02-15 ENCOUNTER — Telehealth: Payer: Self-pay

## 2017-02-15 DIAGNOSIS — R51 Headache: Secondary | ICD-10-CM | POA: Diagnosis not present

## 2017-02-15 DIAGNOSIS — R519 Headache, unspecified: Secondary | ICD-10-CM | POA: Insufficient documentation

## 2017-02-15 DIAGNOSIS — R413 Other amnesia: Secondary | ICD-10-CM | POA: Diagnosis not present

## 2017-02-15 NOTE — Telephone Encounter (Signed)
Patient has been advised of Surgery Date as well as Pre-Admission appointment date, time, and location.  Surgery Date: 02/24/17  Pre-admit Appointment: 02/17/17 from 0900-1300 (Phone)  Patient has been advised to call [phone] the day before surgery between 1-3pm to obtain arrival time.

## 2017-02-15 NOTE — Telephone Encounter (Signed)
Patient has Medicare and no authorization is required CPT code [id number] and [phone].

## 2017-02-17 ENCOUNTER — Encounter
Admission: RE | Admit: 2017-02-17 | Discharge: 2017-02-17 | Disposition: A | Discharge status: Home / Self Care | Payer: Medicaid Other | Source: Ambulatory Visit | Attending: Surgery | Admitting: Surgery

## 2017-02-17 HISTORY — DX: Dyspnea, unspecified: R06.00

## 2017-02-17 NOTE — Pre-Procedure Instructions (Signed)
Progress Notes Encounter Date: 09/15/2016 3:15 PM Wellington Hampshire, MD  Cardiology  Expand All Collapse All   [] Hide copied text     Cardiology Office Note   Date:  09/15/2016   ID:  RODGERS STOLTE, DOB 1961/03/21, MRN [id number]  PCP:  [name], MD  Cardiologist:   [name], MD      Chief Complaint  Patient presents with  . Coronary Artery Disease      History of Present Illness: [name] [name] is a 56 y.o. male who presents for A follow-up visit regarding mild -moderate one-vessel coronary artery disease with stable angina and suspected endothelial dysfunction. He has known history of type 2 diabetes, hypertension, hyperlipidemia and obesity.He is a previous smoker. Cardiac catheterization in June 2016 showed moderate mid LAD stenosis (50%) with an FFR ratio of 0.83 . LV systolic function with normal with normal left ventricular end-diastolic pressure. He had worsening angina in April 2017. Repeat cardiac catheterization showed improvement in mid LAD stenosis to 30% and no evidence of obstructive coronary artery disease. The patient has been treated medically with Ranexa with subsequent improvement in symptoms. He has been doing well overall but has been under stress lately after he had 4 deaths in his family due to multiple reasons. He has been taking his medications regularly. He hasn't had to use sublingual nitroglycerin in a while.       Past Medical History:  Diagnosis Date  . Allergy   . CAD (coronary artery disease)    a. cardiac cath 05/30/2015: LM nl, mLAD 50%, LCx minor irregs, RCA minor irregs, EF 55-65%, no WMA, no MR/AS, nl LVEDP, recommend aggressive med Rx  . Degeneration of intervertebral disc of cervical region   . Diabetes mellitus without complication ([place])   . Hernia 1991   02/10/2012-RIH repair  . Hyperlipidemia   . Hypertension 2008  . Inguinal hernia without mention of obstruction or gangrene, recurrent unilateral or  unspecified 2012  . Obesity, unspecified 2012  . Personal history of tobacco use, presenting hazards to health 2012  . Sleep apnea   . Umbilical hernia without mention of obstruction or gangrene 2012         Past Surgical History:  Procedure Laterality Date  . BACK SURGERY  12/2008  . CARDIAC CATHETERIZATION N/A 05/30/2015   Procedure: Left Heart Cath;  Surgeon: Wellington Hampshire, MD;  Location: [place] [facility];  Service: Cardiovascular;  Laterality: N/A;  . CARDIAC CATHETERIZATION N/A 04/16/2016   Procedure: Left Heart Cath and Coronary Angiography;  Surgeon: Wellington Hampshire, MD;  Location: [place] [facility];  Service: Cardiovascular;  Laterality: N/A;  . COLONOSCOPY  2014   Dr. [name]  . HERNIA REPAIR  1991  . INGUINAL HERNIA REPAIR Right 2012  . INGUINAL HERNIA REPAIR Right 2014           Current Outpatient Prescriptions  Medication Sig Dispense Refill  . Armodafinil (NUVIGIL) 150 MG tablet Take 1 tablet (150 mg total) by mouth daily. 30 tablet 2  . aspirin 81 MG tablet Take 81 mg by mouth daily.    Marland Kitchen atorvastatin (LIPITOR) 80 MG tablet Take 1 tablet (80 mg total) by mouth daily. 30 tablet 5  . carvedilol (COREG) 12.5 MG tablet Take 1 tablet (12.5 mg total) by mouth 2 (two) times daily. 60 tablet 3  . cetirizine (ZYRTEC) 10 MG tablet Take 10 mg by mouth daily.   1  . FLECTOR 1.3 % [facility] Place  1 patch onto the skin 2 (two) times daily. (Patient taking differently: Place 1 patch onto the skin 2 (two) times daily as needed (pain). ) 60 patch 5  . fluticasone (FLONASE) 50 MCG/ACT nasal spray Place 2 sprays into the nose as needed.    . loratadine (CLARITIN) 10 MG tablet Take 10 mg by mouth as needed.     . metFORMIN (GLUCOPHAGE) 850 MG tablet Take 1 tablet (850 mg total) by mouth 2 (two) times daily with a meal. 60 tablet 5  . nitroGLYCERIN (NITROSTAT) 0.4 MG SL tablet Place 1 tablet (0.4 mg total) under the tongue every 5 (five) minutes as needed for chest  pain. 15 tablet 1  . omeprazole (PRILOSEC) 20 MG capsule Take 1 capsule (20 mg total) by mouth every morning. 30 capsule 5  . polyethylene glycol powder (GLYCOLAX/MIRALAX) powder Take 8 g by mouth as needed. For constipation  0  . RANEXA 1000 MG SR tablet take 1 tablet by mouth twice a day 60 tablet 5  . sildenafil (REVATIO) 20 MG tablet TAKE 1 TO 2 TABLETS BY MOUTH ONCE DAILY IF NEEDED 60 tablet 2  . valsartan (DIOVAN) 320 MG tablet Take 1 tablet (320 mg total) by mouth daily. 30 tablet 2   No current facility-administered medications for this visit.     Allergies:   Gabapentin; Latex; and Penicillins    Social History:  The patient  reports that he quit smoking about 11 years ago. His smoking use included Cigars. He started smoking about 13 years ago. He quit after 2.00 years of use. He has never used smokeless tobacco. He reports that he drinks about 0.6 oz of alcohol per week . He reports that he does not use drugs.   Family History:  The patient's family history includes Heart Problems in his mother; Heart Problems (age of onset: 29) in his father; Mental illness in his brother.    ROS:  Please see the history of present illness.   Otherwise, review of systems are positive for none.   All other systems are reviewed and negative.    PHYSICAL EXAM: VS:  BP (!) 146/94 (BP Location: Right Arm, Patient Position: Sitting, Cuff Size: Large)   Pulse 62   Ht 5\' 11"  (1.803 m)   Wt 262 lb 12.8 oz (119.2 kg)   SpO2 93%   BMI 36.65 kg/m  , BMI Body mass index is 36.65 kg/m. GEN: Well nourished, well developed, in no acute distress  HEENT: normal  Neck: no JVD, carotid bruits, or masses Cardiac: RRR; no murmurs, rubs, or gallops,no edema  Respiratory:  clear to auscultation bilaterally, normal work of breathing GI: soft, nontender, nondistended, + BS MS: no deformity or atrophy  Skin: warm and dry, no rash Neuro:  Strength and sensation are intact Psych: euthymic mood, full  affect   EKG:  EKG is not ordered today.    Recent Labs: 04/14/2016: Hemoglobin 15.2; Platelets 210 05/08/2016: ALT 21; BUN 12; Creatinine, Ser 1.17; Potassium 4.4; Sodium 143    Lipid Panel Labs(Brief)          Component Value Date/Time   CHOL 167 05/08/2016 0803   TRIG 149 05/08/2016 0803   HDL 52 05/08/2016 0803   CHOLHDL 3.2 05/08/2016 0803   LDLCALC 85 05/08/2016 0803           Wt Readings from Last 3 Encounters:  09/15/16 262 lb 12.8 oz (119.2 kg)  07/10/16 255 lb 12.8 oz (116 kg)  04/28/16  268 lb (121.6 kg)        ASSESSMENT AND PLAN:  1.  Coronary artery disease involving native coronary arteries with other forms of angina:  The patient has suspected endothelial dysfunction. Cardiac catheterization twice showed no evidence of obstructive coronary artery disease. Continue medical therapy. I had a discussion with him about the importance of lifestyle changes, regular exercise and weight loss.  2. Hyperlipidemia:  Continue treatment with high dose atorvastatin. Most recent LDL was 85.  3. Essential hypertension: Blood pressure is elevated today but he has been under stress due to multiple deaths in his family. Continue to monitor for now.    Disposition:   FU with me in 6 months  Signed,  [name], MD  09/15/2016 3:16 PM    [place]     Electronically signed by Wellington Hampshire, MD at 09/15/2016 4:07 PM      Office Visit on 09/15/2016        Detailed Report

## 2017-02-17 NOTE — Pre-Procedure Instructions (Signed)
[name] [name]  Cardiac catheterization  Order# [id number]  Reading physician: Wellington Hampshire, MD Ordering physician: Wellington Hampshire, MD Study date: 04/16/16  Physicians   Panel Physicians Referring Physician Case Authorizing Physician  Wellington Hampshire, MD (Primary)    Procedures   Left Heart Cath and Coronary Angiography  Conclusion    Mid LAD lesion, 30% stenosed. The lesion was not previously treated.   1. Mild one-vessel coronary artery disease with 30% stenosis in the mid LAD. The lesion actually appears better than what it looked in June 2016. No evidence of obstructive coronary artery disease. 2. Normal left ventricular end-diastolic pressure. Normal LV systolic function by an invasive imaging.  Recommendations: Continue aggressive medical therapy. It is possible that the patient might have endothelial dysfunction given his anginal symptoms and abnormal stress test.   Indications   Coronary artery disease involving native coronary artery of native heart with other form of angina pectoris (HCC) [I25.118 ([id number]  Complications   Complications documented in old activity   Procedural Details: The right wrist was prepped, draped, and anesthetized with 1% lidocaine. Using the modified Seldinger technique, a 5 French sheath was introduced into the right radial artery. 2.5 mg of verapamil was administered through the sheath, weight-based unfractionated heparin was administered intravenously. A Jackie catheter was used for selective coronary angiography and left ventricular pressure. There were no immediate procedural complications. A TR band was used for radial hemostasis at the completion of the procedure. The patient was transferred to the post catheterization recovery area for further monitoring.   During this procedure the patient is administered a total of Versed 1 mg and Fentanyl 25 mcg to achieve and maintain moderate conscious sedation. The patient's heart rate,  blood pressure, and oxygen saturation are monitored continuously during the procedure. The period of conscious sedation is 15 minutes, of which I was present face-to-face 100% of this time.   Estimated blood loss <50 mL. There were no immediate complications during the procedure.      Coronary Findings   Dominance: Right  Left Main  . Vessel is angiographically normal.  Left Anterior Descending  Mid LAD lesion, 30% stenosed. The lesion is type non-C Discrete. The lesion was not previously treated.  First Diagonal Branch  The vessel exhibits minimal luminal irregularities.  Second Diagonal Branch  The vessel is small in size and exhibits minimal luminal irregularities.  Third Diagonal Branch  The vessel is small in size and exhibits minimal luminal irregularities.  Left Circumflex  The vessel exhibits minimal luminal irregularities.  First Obtuse Marginal Branch  The vessel is angiographically normal.  Second Obtuse Marginal Branch  The vessel is angiographically normal.  Third Obtuse Marginal Branch  The vessel is angiographically normal.  Right Coronary Artery  The vessel exhibits minimal luminal irregularities.  Right Posterior Descending Artery  The vessel is small in size and is angiographically normal.  Right Posterior Atrioventricular Branch  The vessel is moderate in size and is angiographically normal.  First Right Posterolateral  The vessel is angiographically normal.  Second Right Posterolateral  The vessel is angiographically normal.  Left Heart   Aortic Valve There is no aortic valve stenosis.    Coronary Diagrams   Diagnostic Diagram     Implants     No implant documentation for this case.  PACS Images   Show images for Cardiac catheterization   Link to Procedure Log   Procedure Log    Hemo Data   AO Systolic Cath  Pressure AO Diastolic Cath Pressure AO Mean Cath Pressure LV Systolic Cath Pressure LV End Diastolic  -- -- -- [id number] mmHg 8 mmHg  --  -- -- 111 mmHg 8 mmHg  -- -- -- 107 mmHg 8 mmHg  104 66 mmHg 83 mmHg -- --  32767 32767 mmHg 81 mmHg -- --  Order-Level Documents:   There are no order-level documents.  Encounter-Level Documents - 04/16/2016:   Scan on 04/20/2016 1:23 PM by Provider Default, MD  Scan on 04/20/2016 1:17 PM by Provider Default, MD  Scan on 04/16/2016 8:21 AM by Provider Default, MD  Scan on 04/16/2016 8:21 AM by Provider Default, MD  Electronic signature on 04/16/2016 6:17 AM      Signed   Electronically signed by Wellington Hampshire, MD on 04/16/16 at [place] EDT  External Result Report   External Result Report

## 2017-02-17 NOTE — Patient Instructions (Addendum)
  Your procedure is scheduled on: 02-24-17 [facility]) Report to Same Day Surgery 2nd floor medical mall [facility] Entrance-take elevator on left to 2nd floor.  Check in with surgery information desk.) To find out your arrival time please call [phone] between 1PM - 3PM on 02-23-17 (TUESDAY)  Remember: Instructions that are not followed completely may result in serious medical risk, up to and including death, or upon the discretion of your surgeon and anesthesiologist your surgery may need to be rescheduled.    _x___ 1. Do not eat food or drink liquids after midnight. No gum chewing or hard candies.     __x__ 2. No Alcohol for 24 hours before or after surgery.   __x__3. No Smoking for 24 prior to surgery.   ____  4. Bring all medications with you on the day of surgery if instructed.    __x__ 5. Notify your doctor if there is any change in your medical condition     (cold, fever, infections).     Do not wear jewelry, make-up, hairpins, clips or nail polish.  Do not wear lotions, powders, or perfumes. You may wear deodorant.  Do not shave 48 hours prior to surgery. Men may shave face and neck.  Do not bring valuables to the hospital.    [facility] is not responsible for any belongings or valuables.               Contacts, dentures or bridgework may not be worn into surgery.  Leave your suitcase in the car. After surgery it may be brought to your room.  For patients admitted to the hospital, discharge time is determined by your treatment team.   Patients discharged the day of surgery will not be allowed to drive home.  You will need someone to drive you home and stay with you the night of your procedure.    Please read over the following fact sheets that you were given:   [facility] Preparing for Surgery and or MRSA Information   _x___ Take these medicines the morning of surgery with A SIP OF WATER:    1. COREG (CARVEDILOL)  2. RANEXA  3. DIOVAN (VALSARTAN)  4. PRILOSEC  (OMEPRAZOLE)  5. LIPITOR  6. ALSO TAKE AN EXTRA PRILOSEC ON Tuesday NIGHT BEFORE BED (02-23-17)  _X___Fleets enema or Magnesium Citrate as directed-DO FLEETS ENEMA AT HOME 1 HOUR PRIOR TO [place]   _x___ Use CHG Soap or sage wipes as directed on instruction sheet   ____ Use inhalers on the day of surgery and bring to hospital day of surgery  _X___ Stop metformin 2 days prior to surgery-LAST DOSE ON Sunday, February 25TH    ____ Take 1/2 of usual insulin dose the night before surgery and none on the morning of  surgery.   _X___ Stop Aspirin, Coumadin, Pllavix ,Eliquis, Effient, or Pradaxa-PT STATES DR PISCOYA TOLD HIM TO STOP ASPIRIN 2 DAYS PRIOR TO  SURGERY  x__ Stop Anti-inflammatories such as Advil, Aleve, Ibuprofen, Motrin, Naproxen,          Naprosyn, Goodies powders or aspirin products NOW-Ok to take Tylenol.   ____ Stop supplements until after surgery.    _X___ Bring C-Pap to the hospital.

## 2017-02-22 ENCOUNTER — Encounter
Admission: RE | Admit: 2017-02-22 | Discharge: 2017-02-22 | Disposition: A | Discharge status: Home / Self Care | Payer: Medicaid Other | Source: Ambulatory Visit | Attending: Surgery | Admitting: Surgery

## 2017-02-22 ENCOUNTER — Ambulatory Visit: Admit: 2017-02-22 | Payer: Medicaid Other | Admitting: Surgery

## 2017-02-22 ENCOUNTER — Other Ambulatory Visit: Payer: Self-pay

## 2017-02-22 DIAGNOSIS — I10 Essential (primary) hypertension: Secondary | ICD-10-CM | POA: Diagnosis not present

## 2017-02-22 DIAGNOSIS — Z01812 Encounter for preprocedural laboratory examination: Secondary | ICD-10-CM | POA: Insufficient documentation

## 2017-02-22 DIAGNOSIS — Z01810 Encounter for preprocedural cardiovascular examination: Secondary | ICD-10-CM | POA: Diagnosis present

## 2017-02-22 DIAGNOSIS — R9431 Abnormal electrocardiogram [ECG] [EKG]: Secondary | ICD-10-CM | POA: Diagnosis not present

## 2017-02-22 DIAGNOSIS — I2510 Atherosclerotic heart disease of native coronary artery without angina pectoris: Secondary | ICD-10-CM | POA: Insufficient documentation

## 2017-02-22 DIAGNOSIS — K625 Hemorrhage of anus and rectum: Secondary | ICD-10-CM

## 2017-02-22 LAB — BASIC METABOLIC PANEL
ANION GAP: 8 (ref 5–15)
BUN: 16 mg/dL (ref 6–20)
CO2: 25 mmol/L (ref 22–32)
Calcium: 8.8 mg/dL — ABNORMAL LOW (ref 8.9–10.3)
Chloride: 107 mmol/L (ref 101–111)
Creatinine, Ser: 1.02 mg/dL (ref 0.61–1.24)
Glucose, Bld: 99 mg/dL (ref 65–99)
POTASSIUM: 3.6 mmol/L (ref 3.5–5.1)
SODIUM: 140 mmol/L (ref 135–145)

## 2017-02-22 SURGERY — EXAM UNDER ANESTHESIA WITH HEMORRHOIDECTOMY
Anesthesia: General

## 2017-02-24 ENCOUNTER — Ambulatory Visit: Payer: Medicaid Other | Admitting: Family Medicine

## 2017-02-24 ENCOUNTER — Ambulatory Visit
Admission: RE | Admit: 2017-02-24 | Discharge: 2017-02-24 | Disposition: A | Discharge status: Home / Self Care | Payer: Medicaid Other | Source: Ambulatory Visit | Attending: Surgery | Admitting: Surgery

## 2017-02-24 ENCOUNTER — Encounter
Admission: RE | Disposition: A | Discharge status: Home / Self Care | Payer: Self-pay | Source: Ambulatory Visit | Attending: Surgery

## 2017-02-24 ENCOUNTER — Ambulatory Visit: Payer: Medicaid Other | Admitting: Certified Registered"

## 2017-02-24 DIAGNOSIS — Z6835 Body mass index (BMI) 35.0-35.9, adult: Secondary | ICD-10-CM | POA: Diagnosis not present

## 2017-02-24 DIAGNOSIS — K602 Anal fissure, unspecified: Secondary | ICD-10-CM | POA: Diagnosis not present

## 2017-02-24 DIAGNOSIS — Z880 Allergy status to penicillin: Secondary | ICD-10-CM | POA: Insufficient documentation

## 2017-02-24 DIAGNOSIS — Z888 Allergy status to other drugs, medicaments and biological substances status: Secondary | ICD-10-CM | POA: Diagnosis not present

## 2017-02-24 DIAGNOSIS — K648 Other hemorrhoids: Secondary | ICD-10-CM | POA: Insufficient documentation

## 2017-02-24 DIAGNOSIS — G4730 Sleep apnea, unspecified: Secondary | ICD-10-CM | POA: Insufficient documentation

## 2017-02-24 DIAGNOSIS — M1990 Unspecified osteoarthritis, unspecified site: Secondary | ICD-10-CM | POA: Diagnosis not present

## 2017-02-24 DIAGNOSIS — E559 Vitamin D deficiency, unspecified: Secondary | ICD-10-CM | POA: Insufficient documentation

## 2017-02-24 DIAGNOSIS — I10 Essential (primary) hypertension: Secondary | ICD-10-CM | POA: Insufficient documentation

## 2017-02-24 DIAGNOSIS — E1140 Type 2 diabetes mellitus with diabetic neuropathy, unspecified: Secondary | ICD-10-CM | POA: Insufficient documentation

## 2017-02-24 DIAGNOSIS — Z7984 Long term (current) use of oral hypoglycemic drugs: Secondary | ICD-10-CM | POA: Insufficient documentation

## 2017-02-24 DIAGNOSIS — I2510 Atherosclerotic heart disease of native coronary artery without angina pectoris: Secondary | ICD-10-CM | POA: Diagnosis not present

## 2017-02-24 DIAGNOSIS — E785 Hyperlipidemia, unspecified: Secondary | ICD-10-CM | POA: Diagnosis not present

## 2017-02-24 DIAGNOSIS — Z86010 Personal history of colonic polyps: Secondary | ICD-10-CM | POA: Diagnosis not present

## 2017-02-24 DIAGNOSIS — Z7982 Long term (current) use of aspirin: Secondary | ICD-10-CM | POA: Diagnosis not present

## 2017-02-24 DIAGNOSIS — K219 Gastro-esophageal reflux disease without esophagitis: Secondary | ICD-10-CM | POA: Diagnosis not present

## 2017-02-24 DIAGNOSIS — E669 Obesity, unspecified: Secondary | ICD-10-CM | POA: Diagnosis not present

## 2017-02-24 DIAGNOSIS — K625 Hemorrhage of anus and rectum: Secondary | ICD-10-CM | POA: Diagnosis not present

## 2017-02-24 DIAGNOSIS — Z87891 Personal history of nicotine dependence: Secondary | ICD-10-CM | POA: Insufficient documentation

## 2017-02-24 DIAGNOSIS — K5909 Other constipation: Secondary | ICD-10-CM | POA: Insufficient documentation

## 2017-02-24 DIAGNOSIS — Z91040 Latex allergy status: Secondary | ICD-10-CM | POA: Diagnosis not present

## 2017-02-24 HISTORY — PX: FISSURECTOMY: SHX5244

## 2017-02-24 HISTORY — PX: EVALUATION UNDER ANESTHESIA WITH HEMORRHOIDECTOMY: SHX5624

## 2017-02-24 LAB — GLUCOSE, CAPILLARY
Glucose-Capillary: 100 mg/dL — ABNORMAL HIGH (ref 65–99)
Glucose-Capillary: 101 mg/dL — ABNORMAL HIGH (ref 65–99)

## 2017-02-24 SURGERY — EXAM UNDER ANESTHESIA WITH HEMORRHOIDECTOMY
Anesthesia: General | Wound class: Dirty or Infected

## 2017-02-24 MED ORDER — LIDOCAINE HCL 2 % EX GEL
CUTANEOUS | Status: DC | PRN
  Administered 2017-02-24: 1

## 2017-02-24 MED ORDER — FENTANYL CITRATE (PF) 100 MCG/2ML IJ SOLN
INTRAMUSCULAR | Status: AC
  Administered 2017-02-24: 25 ug via INTRAVENOUS
  Filled 2017-02-24: qty 2

## 2017-02-24 MED ORDER — GELATIN ABSORBABLE 100 CM EX MISC
CUTANEOUS | Status: AC
  Filled 2017-02-24: qty 1

## 2017-02-24 MED ORDER — PROPOFOL 10 MG/ML IV BOLUS
INTRAVENOUS | Status: DC | PRN
  Administered 2017-02-24: 200 mg via INTRAVENOUS

## 2017-02-24 MED ORDER — ONDANSETRON HCL 4 MG/2ML IJ SOLN: 4.0000 mg | Freq: Once | INTRAMUSCULAR | Status: DC | PRN

## 2017-02-24 MED ORDER — PROPOFOL 10 MG/ML IV BOLUS
INTRAVENOUS | Status: AC
  Filled 2017-02-24: qty 20

## 2017-02-24 MED ORDER — SUGAMMADEX SODIUM 500 MG/5ML IV SOLN
INTRAVENOUS | Status: DC | PRN
  Administered 2017-02-24: 230 mg via INTRAVENOUS

## 2017-02-24 MED ORDER — LIDOCAINE HCL (PF) 2 % IJ SOLN
INTRAMUSCULAR | Status: AC
  Filled 2017-02-24: qty 2

## 2017-02-24 MED ORDER — SUGAMMADEX SODIUM 500 MG/5ML IV SOLN
INTRAVENOUS | Status: AC
  Filled 2017-02-24: qty 5

## 2017-02-24 MED ORDER — IBUPROFEN 600 MG PO TABS: 600.0000 mg | ORAL_TABLET | Freq: Three times a day (TID) | ORAL | 0 refills | Status: DC | PRN

## 2017-02-24 MED ORDER — SUCCINYLCHOLINE CHLORIDE 20 MG/ML IJ SOLN
INTRAMUSCULAR | Status: DC | PRN
  Administered 2017-02-24: 140 mg via INTRAVENOUS

## 2017-02-24 MED ORDER — ROCURONIUM BROMIDE 100 MG/10ML IV SOLN
INTRAVENOUS | Status: DC | PRN
  Administered 2017-02-24: 5 mg via INTRAVENOUS

## 2017-02-24 MED ORDER — ONDANSETRON HCL 4 MG/2ML IJ SOLN
INTRAMUSCULAR | Status: DC | PRN
  Administered 2017-02-24: 4 mg via INTRAVENOUS

## 2017-02-24 MED ORDER — BUPIVACAINE-EPINEPHRINE (PF) 0.5% -1:200000 IJ SOLN
INTRAMUSCULAR | Status: AC
  Filled 2017-02-24: qty 30

## 2017-02-24 MED ORDER — MIDAZOLAM HCL 2 MG/2ML IJ SOLN
INTRAMUSCULAR | Status: AC
  Filled 2017-02-24: qty 2

## 2017-02-24 MED ORDER — PHENYLEPHRINE HCL 10 MG/ML IJ SOLN
INTRAMUSCULAR | Status: DC | PRN
  Administered 2017-02-24 (×4): 100 ug via INTRAVENOUS

## 2017-02-24 MED ORDER — LIDOCAINE HCL 2 % EX GEL
CUTANEOUS | Status: AC
  Filled 2017-02-24: qty 10

## 2017-02-24 MED ORDER — POLYETHYLENE GLYCOL 3350 17 GM/SCOOP PO POWD: 17.0000 g | Freq: Every day | ORAL | 0 refills | Status: DC

## 2017-02-24 MED ORDER — LIDOCAINE HCL (CARDIAC) 20 MG/ML IV SOLN
INTRAVENOUS | Status: DC | PRN
  Administered 2017-02-24: 100 mg via INTRAVENOUS

## 2017-02-24 MED ORDER — OXYCODONE HCL 5 MG PO TABS: 5.0000 mg | ORAL_TABLET | Freq: Four times a day (QID) | ORAL | 0 refills | Status: DC | PRN

## 2017-02-24 MED ORDER — SODIUM CHLORIDE 0.9 % IV SOLN
INTRAVENOUS | Status: DC
  Administered 2017-02-24: 11:00:00 via INTRAVENOUS

## 2017-02-24 MED ORDER — FENTANYL CITRATE (PF) 100 MCG/2ML IJ SOLN
25.0000 ug | INTRAMUSCULAR | Status: DC | PRN
  Administered 2017-02-24 (×3): 25 ug via INTRAVENOUS

## 2017-02-24 MED ORDER — BUPIVACAINE-EPINEPHRINE (PF) 0.5% -1:200000 IJ SOLN
INTRAMUSCULAR | Status: DC | PRN
  Administered 2017-02-24: 20 mL via PERINEURAL

## 2017-02-24 MED ORDER — CHLORHEXIDINE GLUCONATE CLOTH 2 % EX PADS: 6.0000 | MEDICATED_PAD | Freq: Once | CUTANEOUS | Status: DC

## 2017-02-24 MED ORDER — GELATIN ABSORBABLE 12-7 MM EX MISC
CUTANEOUS | Status: DC | PRN
  Administered 2017-02-24: 1

## 2017-02-24 MED ORDER — FENTANYL CITRATE (PF) 100 MCG/2ML IJ SOLN
INTRAMUSCULAR | Status: AC
  Filled 2017-02-24: qty 2

## 2017-02-24 MED ORDER — FENTANYL CITRATE (PF) 100 MCG/2ML IJ SOLN
INTRAMUSCULAR | Status: DC | PRN
  Administered 2017-02-24: 100 ug via INTRAVENOUS

## 2017-02-24 MED ORDER — MIDAZOLAM HCL 2 MG/2ML IJ SOLN
INTRAMUSCULAR | Status: DC | PRN
Start: 2017-02-24 — End: 2017-02-24
  Administered 2017-02-24: 2 mg via INTRAVENOUS

## 2017-02-24 MED ORDER — FLEET ENEMA 7-19 GM/118ML RE ENEM: 1.0000 | ENEMA | Freq: Once | RECTAL | Status: DC

## 2017-02-24 MED ORDER — ONDANSETRON HCL 4 MG/2ML IJ SOLN
INTRAMUSCULAR | Status: AC
  Filled 2017-02-24: qty 2

## 2017-02-24 MED ORDER — SUCCINYLCHOLINE CHLORIDE 20 MG/ML IJ SOLN
INTRAMUSCULAR | Status: AC
  Filled 2017-02-24: qty 1

## 2017-02-24 SURGICAL SUPPLY — 35 items
BLADE SURG 15 STRL LF DISP TIS (BLADE) ×2 IMPLANT
BLADE SURG 15 STRL SS (BLADE) ×2
BLADE SURG 15 STRL SS SAFETY (BLADE) IMPLANT
BRIEF STRETCH MATERNITY 2XLG (MISCELLANEOUS) ×4 IMPLANT
CANISTER SUCT 1200ML W/VALVE (MISCELLANEOUS) ×4 IMPLANT
DRAPE LAPAROTOMY T 102X78X121 (DRAPES) ×4 IMPLANT
ELECT CAUTERY NEEDLE TIP 1.0 (MISCELLANEOUS) ×4
ELECT REM PT RETURN 9FT ADLT (ELECTROSURGICAL) ×4
ELECTRODE CAUTERY NEDL TIP 1.0 (MISCELLANEOUS) ×2 IMPLANT
ELECTRODE REM PT RTRN 9FT ADLT (ELECTROSURGICAL) ×2 IMPLANT
GAUZE FLUFF 18X24 1PLY STRL (GAUZE/BANDAGES/DRESSINGS) ×4 IMPLANT
GLOVE BIO SURGEON STRL SZ7 (GLOVE) IMPLANT
GLOVE BIO SURGEON STRL SZ7.5 (GLOVE) IMPLANT
GLOVE BIOGEL PI IND STRL 7.0 (GLOVE) ×4 IMPLANT
GLOVE BIOGEL PI INDICATOR 7.0 (GLOVE) ×4
GLOVE SURG SYN 7.5  E (GLOVE) ×4
GLOVE SURG SYN 7.5 E (GLOVE) ×4 IMPLANT
GOWN STRL REUS W/ TWL LRG LVL3 (GOWN DISPOSABLE) ×4 IMPLANT
GOWN STRL REUS W/TWL LRG LVL3 (GOWN DISPOSABLE) ×4
KIT RM TURNOVER STRD PROC AR (KITS) ×4 IMPLANT
LABEL OR SOLS (LABEL) IMPLANT
NEEDLE HYPO 22GX1.5 SAFETY (NEEDLE) ×4 IMPLANT
NEEDLE HYPO 25X1 1.5 SAFETY (NEEDLE) ×4 IMPLANT
NS IRRIG 500ML POUR BTL (IV SOLUTION) ×4 IMPLANT
PACK BASIN MINOR ARMC (MISCELLANEOUS) ×4 IMPLANT
PAD OB MATERNITY 4.3X12.25 (PERSONAL CARE ITEMS) IMPLANT
PAD PREP 24X41 OB/GYN DISP (PERSONAL CARE ITEMS) ×4 IMPLANT
SOL PREP PVP 2OZ (MISCELLANEOUS) ×4
SOLUTION PREP PVP 2OZ (MISCELLANEOUS) ×2 IMPLANT
SURGILUBE 2OZ TUBE FLIPTOP (MISCELLANEOUS) ×4 IMPLANT
SUT VIC AB 3-0 SH 27 (SUTURE) ×4
SUT VIC AB 3-0 SH 27X BRD (SUTURE) ×4 IMPLANT
SWABSTK COMLB BENZOIN TINCTURE (MISCELLANEOUS) ×4 IMPLANT
SYR 10ML LL (SYRINGE) ×4 IMPLANT
SYR BULB IRRIG 60ML STRL (SYRINGE) ×4 IMPLANT

## 2017-02-24 NOTE — Anesthesia Post-op Follow-up Note (Cosign Needed)
Anesthesia QCDR form completed.        

## 2017-02-24 NOTE — H&P (View-Only) (Signed)
02/11/2017  Reason for Visit:  Rectal bleeding  History of Present Illness: [name] [name] is a 56 y.o. male who presents with a history of abdominal pain, diarrhea, and rectal bleeding.  He had been referred to Dr. Vicente Males who did a colonoscopy about two weeks ago, revealing polyps and non-bleeding internal hemorrhoids.  However, the patient reports that he is still having daily bleeding, bright red in nature, with food "running through" him after meals, along with abdominal discomfort.  He was referred for possible hemorrhoidectomy.  He notices the blood in the tissue paper when he wipes as well as in the stool.  He may have blood first, then stool, then more blood when he wipes.  He reports that sometimes he will wake up and there is blood on the sheets. He also reports losing 10 lbs in the past 10 days and feels that he cant eat because food runs through him.  However, he does not necessarily have diarrhea, as he may have formed stool as well.  Before the colonoscopy, he reports having occasional bleeding, but afterwards, he has been having bleeding daily.  He is very concerned and wants to figure out what is the cause.   Past Medical History: Past Medical History:  Diagnosis Date  . Allergy   . Angina, class III ([place])   . CAD (coronary artery disease)    a. cardiac cath 05/30/2015: LM nl, mLAD 50%, LCx minor irregs, RCA minor irregs, EF 55-65%, no WMA, no MR/AS, nl LVEDP, recommend aggressive med Rx  . Chronic constipation   . Chronic low blood pressure   . Degeneration of intervertebral disc of cervical region   . Diabetes mellitus without complication ([place])   . GERD (gastroesophageal reflux disease)   . Hernia 1991   02/10/2012-RIH repair  . Hyperlipidemia   . Hypertension 2008  . Inguinal hernia without mention of obstruction or gangrene, recurrent unilateral or unspecified 2012  . Nerve root pain   . Neuropathy ([place])   . Obesity, unspecified 2012  . Personal history of tobacco use,  presenting hazards to health 2012  . Polycythemia   . Sleep apnea   . Umbilical hernia without mention of obstruction or gangrene 2012  . Vitamin D deficiency      Past Surgical History: Past Surgical History:  Procedure Laterality Date  . BACK SURGERY  12/2008  . CARDIAC CATHETERIZATION N/A 05/30/2015   Procedure: Left Heart Cath;  Surgeon: Wellington Hampshire, MD;  Location: [place] [facility];  Service: Cardiovascular;  Laterality: N/A;  . CARDIAC CATHETERIZATION N/A 04/16/2016   Procedure: Left Heart Cath and Coronary Angiography;  Surgeon: Wellington Hampshire, MD;  Location: [place] [facility];  Service: Cardiovascular;  Laterality: N/A;  . COLONOSCOPY  2014   Dr. [name]  . COLONOSCOPY WITH PROPOFOL N/A 12/25/2016   Procedure: COLONOSCOPY WITH PROPOFOL;  Surgeon: Jonathon Bellows, MD;  Location: [facility];  Service: Endoscopy;  Laterality: N/A;  . COLONOSCOPY WITH PROPOFOL N/A 01/29/2017   Procedure: COLONOSCOPY WITH PROPOFOL;  Surgeon: Jonathon Bellows, MD;  Location: [facility];  Service: Endoscopy;  Laterality: N/A;  . HERNIA REPAIR  1991  . INGUINAL HERNIA REPAIR Right 2012  . INGUINAL HERNIA REPAIR Right 2014    Home Medications: Prior to Admission medications   Medication Sig Start Date End Date Taking? Authorizing Provider  aspirin 81 MG tablet Take 81 mg by mouth daily.   Yes Historical Provider, MD  atorvastatin (LIPITOR) 80 MG tablet Take 1 tablet (  80 mg total) by mouth daily. 11/26/16  Yes Steele Sizer, MD  carvedilol (COREG) 12.5 MG tablet Take 1 tablet (12.5 mg total) by mouth 2 (two) times daily. 05/04/16  Yes Minna Merritts, MD  cetirizine (ZYRTEC) 10 MG tablet Take 10 mg by mouth daily.  07/09/16  Yes Historical Provider, MD  chlorpheniramine-HYDROcodone (TUSSIONEX PENNKINETIC ER) 10-8 MG/5ML SUER Take 5 mLs by mouth every 12 (twelve) hours as needed. 01/22/17  Yes Steele Sizer, MD  diclofenac sodium (VOLTAREN) 1 % GEL Apply 4 g topically 4 (four) times daily. 11/27/16   Yes Steele Sizer, MD  dicyclomine (BENTYL) 10 MG capsule Take 1 capsule (10 mg total) by mouth 3 (three) times daily before meals. 02/10/17  Yes Jonathon Bellows, MD  FLECTOR 1.3 % [facility]  12/07/16  Yes Historical Provider, MD  fluticasone (FLONASE) 50 MCG/ACT nasal spray Place 2 sprays into the nose as needed. 03/05/15  Yes Historical Provider, MD  hydrocortisone (ANUSOL-HC) 25 MG suppository Place 1 suppository (25 mg total) rectally 2 (two) times daily. 02/03/17  Yes Jonathon Bellows, MD  metFORMIN (GLUCOPHAGE) 850 MG tablet Take 1 tablet (850 mg total) by mouth 2 (two) times daily with a meal. 11/26/16  Yes Steele Sizer, MD  modafinil (PROVIGIL) 200 MG tablet Take 1 tablet (200 mg total) by mouth daily. 12/04/16  Yes Steele Sizer, MD  NITROSTAT 0.4 MG SL tablet place 1 tablet under the tongue every 5 minutes for UP TO 3 doses if needed for angina as directed by prescriber 01/15/17  Yes Wellington Hampshire, MD  omeprazole (PRILOSEC) 20 MG capsule Take 1 capsule (20 mg total) by mouth every morning. 11/26/16  Yes Steele Sizer, MD  polyethylene glycol powder (GLYCOLAX/MIRALAX) powder Take 8 g by mouth as needed. For constipation 03/05/15  Yes Historical Provider, MD  RANEXA 1000 MG SR tablet take 1 tablet by mouth twice a day 06/05/16  Yes Wellington Hampshire, MD  Selenium Sulf-Pyrithione-Urea (SELENIUM SULFIDE) 2.25 % SHAM  11/27/16  Yes Historical Provider, MD  Selenium Sulfide 2.25 % LOTN Apply 5 mLs topically daily. 11/26/16  Yes Steele Sizer, MD  sildenafil (REVATIO) 20 MG tablet take 1-2 tablets by mouth once daily if needed 02/11/17  Yes Steele Sizer, MD  valsartan (DIOVAN) 160 MG tablet take 1 tablet by mouth once daily 12/03/16  Yes Wellington Hampshire, MD    Allergies: Allergies  Allergen Reactions  . Gabapentin Other (See Comments)    Groggy-Mood Changes  . Latex Rash  . Penicillins Hives and Rash    Social History:  reports that he quit smoking about 11 years ago. His smoking use included Cigars. He  started smoking about 14 years ago. He quit after 2.00 years of use. He has never used smokeless tobacco. He reports that he drinks about 0.6 oz of alcohol per week . He reports that he does not use drugs.   Family History: Family History  Problem Relation Age of Onset  . Heart Problems Mother   . Heart Problems Father 36    myocardial infarction  . Mental illness Brother     Review of Systems: Review of Systems  Constitutional: Negative for chills and fever.  HENT: Negative for hearing loss.   Eyes: Negative for blurred vision.  Respiratory: Negative for cough and shortness of breath.   Cardiovascular: Negative for chest pain and leg swelling.  Gastrointestinal: Positive for abdominal pain and blood in stool. Negative for heartburn, nausea and vomiting.  Genitourinary: Negative for dysuria  and hematuria.  Musculoskeletal: Negative for myalgias.  Skin: Negative for rash.  Neurological: Negative for dizziness.  Psychiatric/Behavioral: Negative for depression.  All other systems reviewed and are negative.   Physical Exam BP (!) 159/96   Pulse 64   Temp 97.8 F (36.6 C) (Oral)   Ht 5\' 11"  (1.803 m)   Wt 115.7 kg (255 lb)   BMI 35.57 kg/m  CONSTITUTIONAL: No acute distress HEENT:  Normocephalic, atraumatic, extraocular motion intact. NECK: Trachea is midline, and there is no jugular venous distension.  RESPIRATORY:  Lungs are clear, and breath sounds are equal bilaterally. Normal respiratory effort without pathologic use of accessory muscles. CARDIOVASCULAR: Heart is regular without murmurs, gallops, or rubs. GI: The abdomen is soft, nondistended, nontender. There were no palpable masses.  RECTAL:  No perianal lesions or tears.  No palpable masses or abscess.  On rectal exam, there is appropriate rectal tone.  No masses palpable.  No protrusion of tissue when patient bearing down.  Right posterior hemorrhoid column possibly enlarged but nontender.  No bleeding on  glove. MUSCULOSKELETAL:  Normal muscle strength and tone in all four extremities.  No peripheral edema or cyanosis. SKIN: Skin turgor is normal. There are no pathologic skin lesions.  NEUROLOGIC:  Motor and sensation is grossly normal.  Cranial nerves are grossly intact. PSYCH:  Alert and oriented to person, place and time. Affect is normal.  Laboratory Analysis: No results found for this or any previous visit (from the past 24 hour(s)).  Imaging: No results found.  Assessment and Plan: This is a 56 y.o. male who presents with history of rectal bleeding, with enlarged internal hemorrhoids.  Unclear currently if the patient may have hemorrhoidal bleeding or not.  On exam, there was no gross blood and no blood on glove after rectal exam.  There is no anoscope available in clinic and thus unable to see the full extent of possible issues.    Have discussed with the patient different recommendations for possible hemorrhoidal bleeding.  Recommended that he start Psyllium Konsyl fiber once daily as well as Sitz baths.  Would also plan for an Exam under Anesthesia in the Operating room to better evaluate for his source of bleeding.  Discussed with the patient that if indeed this is hemorrhoid in nature, then would proceed with hemorrhoidectomy.  Risks and benefits of the procedure were discussed with the patient and he has given informed consent.  He will be booked for OR on 2/26.  He is aware that if there is no further bleeding, he may cancer the surgery at any time.   Melvyn Neth, [place]

## 2017-02-24 NOTE — Discharge Instructions (Signed)
AMBULATORY SURGERY  DISCHARGE INSTRUCTIONS   1) The drugs that you were given will stay in your system until tomorrow so for the next 24 hours you should not:  A) Drive an automobile B) Make any legal decisions C) Drink any alcoholic beverage   2) You may resume regular meals tomorrow.  Today it is better to start with liquids and gradually work up to solid foods.  You may eat anything you prefer, but it is better to start with liquids, then soup and crackers, and gradually work up to solid foods.   3) Please notify your doctor immediately if you have any unusual bleeding, trouble breathing, redness and pain at the surgery site, drainage, fever, or pain not relieved by medication.    4) Additional Instructions:Don't forget to take your Miralax daily.   Please contact your physician with any problems or Same Day Surgery at [phone], Monday through Friday 6 am to 4 pm, or [place] at [facility] number at [phone].

## 2017-02-24 NOTE — Transfer of Care (Signed)
Immediate Anesthesia Transfer of Care Note  Patient: [name] [name]  Procedure(s) Performed: Procedure(s): EXAM UNDER ANESTHESIA WITH POSSIBLE EXCISION OF INTERNAL HEMORRHOIDS (N/A) FISSURECTOMY  Patient Location: PACU  Anesthesia Type:General  Level of Consciousness: awake, alert  and responds to stimulation  Airway & Oxygen Therapy: Patient Spontanous Breathing and Patient connected to face mask oxygen  Post-op Assessment: Report given to RN and Post -op Vital signs reviewed and stable  Post vital signs: Reviewed and stable  Last Vitals:  Vitals:   02/24/17 1043 02/24/17 1342  BP: (!) 159/103 121/74  Pulse: 63 (!) 54  Resp: 18 (!) 22  Temp: 36.1 C     Last Pain:  Vitals:   02/24/17 1043  TempSrc: Tympanic  PainSc: 6          Complications: No apparent anesthesia complications

## 2017-02-24 NOTE — Anesthesia Procedure Notes (Signed)
Procedure Name: Intubation Performed by: Lance Muss Pre-anesthesia Checklist: Patient identified, Patient being monitored, Timeout performed, Emergency Drugs available and Suction available Patient Re-evaluated:Patient Re-evaluated prior to inductionOxygen Delivery Method: Circle system utilized Preoxygenation: Pre-oxygenation with 100% oxygen Intubation Type: IV induction Ventilation: Mask ventilation without difficulty and Oral airway inserted - appropriate to patient size Laryngoscope Size: Mac and 3 Grade View: Grade I Tube type: Oral Tube size: 7.5 mm Number of attempts: 1 Airway Equipment and Method: Stylet Placement Confirmation: ETT inserted through vocal cords under direct vision,  positive ETCO2 and breath sounds checked- equal and bilateral Secured at: 24 cm Tube secured with: Tape Dental Injury: Teeth and Oropharynx as per pre-operative assessment

## 2017-02-24 NOTE — Anesthesia Postprocedure Evaluation (Signed)
Anesthesia Post Note  Patient: [name] [name]  Procedure(s) Performed: Procedure(s) (LRB): EXAM UNDER ANESTHESIA WITH POSSIBLE EXCISION OF INTERNAL HEMORRHOIDS (N/A) FISSURECTOMY  Patient location during evaluation: PACU Anesthesia Type: General Level of consciousness: awake and alert and oriented Pain management: pain level controlled Vital Signs Assessment: post-procedure vital signs reviewed and stable Respiratory status: spontaneous breathing Cardiovascular status: blood pressure returned to baseline Anesthetic complications: no     Last Vitals:  Vitals:   02/24/17 1043 02/24/17 1342  BP: (!) 159/103 121/74  Pulse: 63 (!) 54  Resp: 18 (!) 22  Temp: 36.1 C (!) 36.1 C    Last Pain:  Vitals:   02/24/17 1043  TempSrc: Tympanic  PainSc: 6                  Amad Mau

## 2017-02-24 NOTE — Interval H&P Note (Signed)
History and Physical Interval Note:  02/24/2017 11:57 AM  [name] [name]  has presented today for surgery, with the diagnosis of RECTAL BLEEDING  The various methods of treatment have been discussed with the patient and family. After consideration of risks, benefits and other options for treatment, the patient has consented to  Procedure(s): EXAM UNDER ANESTHESIA WITH POSSIBLE EXCISION OF INTERNAL HEMORRHOIDS (N/A) as a surgical intervention .  The patient's history has been reviewed, patient examined, no change in status, stable for surgery.  I have reviewed the patient's chart and labs.  Questions were answered to the patient's satisfaction.     Annelise Mccoy

## 2017-02-24 NOTE — Anesthesia Preprocedure Evaluation (Signed)
Anesthesia Evaluation  Patient identified by MRN, date of birth, ID band Patient awake    Reviewed: Allergy & Precautions, NPO status , Patient's Chart, lab work & pertinent test results, reviewed documented beta blocker date and time   Airway Mallampati: III  TM Distance: >3 FB     Dental  (+) Upper Dentures, Chipped   Pulmonary shortness of breath and with exertion, sleep apnea , former smoker,    Pulmonary exam normal        Cardiovascular hypertension, Pt. on medications and Pt. on home beta blockers + angina with exertion + CAD  Normal cardiovascular exam     Neuro/Psych  Neuromuscular disease negative psych ROS   GI/Hepatic Neg liver ROS, GERD  Medicated and Controlled,Rectal bleeding   Endo/Other  diabetes, Well Controlled, Type 2, Oral Hypoglycemic Agents  Renal/GU negative Renal ROS     Musculoskeletal  (+) Arthritis , Osteoarthritis,    Abdominal (+) + obese,   Peds  Hematology negative hematology ROS (+)   Anesthesia Other Findings Past Medical History: No date: Allergy No date: Angina, class III (HCC) No date: CAD (coronary artery disease)     Comment: a. cardiac cath 05/30/2015: LM nl, mLAD 50%, LCx              minor irregs, RCA minor irregs, EF 55-65%, no               WMA, no MR/AS, nl LVEDP, recommend aggressive               med Rx No date: Chronic constipation No date: Chronic low blood pressure No date: Degeneration of intervertebral disc of cervica* No date: Diabetes mellitus without complication (HCC) No date: Dyspnea No date: GERD (gastroesophageal reflux disease) 1991: Hernia     Comment: 02/10/2012-RIH repair No date: Hyperlipidemia 2008: Hypertension 2012: Inguinal hernia without mention of obstruction* No date: Nerve root pain No date: Neuropathy ([place]) 2012: Obesity, unspecified 2012: Personal history of tobacco use, presenting ha* No date: Polycythemia No date: Sleep apnea  Comment: CPAP 0000000: Umbilical hernia without mention of obstructio* No date: Vitamin D deficiency  Reproductive/Obstetrics                             Anesthesia Physical Anesthesia Plan  ASA: III  Anesthesia Plan: General   Post-op Pain Management:    Induction: Intravenous  Airway Management Planned: Oral ETT  Additional Equipment:   Intra-op Plan:   Post-operative Plan: Extubation in OR  Informed Consent: I have reviewed the patients History and Physical, chart, labs and discussed the procedure including the risks, benefits and alternatives for the proposed anesthesia with the patient or authorized representative who has indicated his/her understanding and acceptance.   Dental advisory given  Plan Discussed with: CRNA and Surgeon  Anesthesia Plan Comments:         Anesthesia Quick Evaluation

## 2017-02-24 NOTE — Op Note (Signed)
  Procedure Date:  02/24/2017  Pre-operative Diagnosis:  Rectal bleeding  Post-operative Diagnosis:  Anal fissure and enlarged left lateral internal hemorrhoid  Procedure:  Exam under Anesthesia, Hemorrhoidectomy, and Fissurectomy  Surgeon:  Melvyn Neth, MD  Anesthesia:  General endotracheal  Estimated Blood Loss:  15 ml  Specimens:  Left lateral hemorrhoid  Complications:  None  Indications for Procedure:  This is a 56 y.o. male with a history of rectal bleeding, found to have enlarged internal hemorrhoids on colonoscopy.  Seen in the office and unable to do a thorough exam and discussed with the patient the need to do an exam under anesthesia.  The risks of bleeding, abscess or infection, injury to surrounding structures, and need for further procedures were all discussed with the patient and he was willing to proceed.  Description of Procedure: The patient was correctly identified in the preoperative area and brought into the operating room.  The patient was placed supine with VTE prophylaxis in place.  Appropriate time-outs were performed.  Anesthesia was induced and the patient was intubated.  The patient was then turned into prone jackknife position.   The patient's perianal area was prepped and draped in usual sterile fashion.   The anal sphincter was first dilated manually with one finger, then two fingers, and finally three fingers.  An anoscope with obturator was introduced and the anal canal and anal verge were examined, revealing a left lateral superficial fissure and next to it, an enlarged left lateral hemorrhoidal column.  The right anterior and posterior columns were not as enlarged.  There were no ulcers or other lesions or fissures.  Using cautery, the left lateral hemorrhoidal column was resected, incorporating the fissure with it.  Hemostasis was controlled with cautery.  The anal mucosa and external anal skin was then approximated using a running 3-0 Vicryl suture,  leaving the external edge open for drainage.  The anal canal was then irrigated thoroughly and hemostasis obtained.  A rolled Gelfoam pad with lidocaine gel was introduced into the canal and left in place.  A perianal and pudendal block were performed as well.  The anal opening was cleaned and dressed with fluff gauze and covered with mesh underwear.  The patient was then placed back in supine position, emerged from anesthesia and extubated, and brought to the recovery room in good condition.  The patient tolerated the procedure well and all counts were correct at the end of the case.   Melvyn Neth, MD

## 2017-02-25 ENCOUNTER — Encounter: Payer: Self-pay | Admitting: Surgery

## 2017-02-25 ENCOUNTER — Other Ambulatory Visit: Payer: Self-pay | Admitting: Neurology

## 2017-02-25 DIAGNOSIS — R413 Other amnesia: Secondary | ICD-10-CM

## 2017-02-25 LAB — SURGICAL PATHOLOGY

## 2017-02-25 LAB — GLUCOSE, CAPILLARY: Glucose-Capillary: 35 mg/dL — CL (ref 65–99)

## 2017-02-26 ENCOUNTER — Telehealth: Payer: Self-pay | Admitting: Surgery

## 2017-02-26 NOTE — Telephone Encounter (Signed)
Returned phone call to patient. Patient has been using Miralax without success.  Informed patient that they should increase water intake and activity level as much as possible to help with bowel movement. Also educated that they may use Magnesium Citrate x 1 bottle and If they are unsuccessful with oral medications, they will need to do 2 fleets enemas back to back. Asked patient to call back for further instructions if after taking all doses of these medications, they are still unsuccessful with a bowel movement.  Patient verbalizes understanding of this information.

## 2017-02-26 NOTE — Telephone Encounter (Signed)
Patient unable to use the bathroom since surgery on 2/28

## 2017-03-09 ENCOUNTER — Ambulatory Visit
Admission: RE | Admit: 2017-03-09 | Discharge: 2017-03-09 | Disposition: A | Discharge status: Home / Self Care | Payer: Medicaid Other | Source: Ambulatory Visit | Attending: Neurology | Admitting: Neurology

## 2017-03-09 DIAGNOSIS — R413 Other amnesia: Secondary | ICD-10-CM | POA: Diagnosis present

## 2017-03-09 DIAGNOSIS — I639 Cerebral infarction, unspecified: Secondary | ICD-10-CM | POA: Insufficient documentation

## 2017-03-09 DIAGNOSIS — R51 Headache: Secondary | ICD-10-CM | POA: Diagnosis present

## 2017-03-10 ENCOUNTER — Telehealth: Payer: Self-pay

## 2017-03-10 ENCOUNTER — Encounter: Payer: Self-pay | Admitting: Family Medicine

## 2017-03-10 ENCOUNTER — Ambulatory Visit (INDEPENDENT_AMBULATORY_CARE_PROVIDER_SITE_OTHER): Payer: Medicaid Other | Admitting: Family Medicine

## 2017-03-10 VITALS — BP 130/76 | HR 104 | Temp 98.0°F | Resp 18 | Ht 71.0 in | Wt 256.6 lb

## 2017-03-10 DIAGNOSIS — G4731 Primary central sleep apnea: Secondary | ICD-10-CM | POA: Diagnosis not present

## 2017-03-10 DIAGNOSIS — I10 Essential (primary) hypertension: Secondary | ICD-10-CM | POA: Diagnosis not present

## 2017-03-10 DIAGNOSIS — E782 Mixed hyperlipidemia: Secondary | ICD-10-CM | POA: Diagnosis not present

## 2017-03-10 DIAGNOSIS — E1129 Type 2 diabetes mellitus with other diabetic kidney complication: Secondary | ICD-10-CM | POA: Diagnosis not present

## 2017-03-10 DIAGNOSIS — K219 Gastro-esophageal reflux disease without esophagitis: Secondary | ICD-10-CM | POA: Diagnosis not present

## 2017-03-10 DIAGNOSIS — I209 Angina pectoris, unspecified: Secondary | ICD-10-CM | POA: Diagnosis not present

## 2017-03-10 DIAGNOSIS — R809 Proteinuria, unspecified: Secondary | ICD-10-CM

## 2017-03-10 DIAGNOSIS — E1140 Type 2 diabetes mellitus with diabetic neuropathy, unspecified: Secondary | ICD-10-CM

## 2017-03-10 LAB — POCT UA - MICROALBUMIN: Microalbumin Ur, POC: 50 mg/L

## 2017-03-10 LAB — POCT GLYCOSYLATED HEMOGLOBIN (HGB A1C): Hemoglobin A1C: 5.8

## 2017-03-10 MED ORDER — MODAFINIL 200 MG PO TABS: 200.0000 mg | ORAL_TABLET | Freq: Every day | ORAL | 2 refills | Status: DC

## 2017-03-10 MED ORDER — OMEPRAZOLE 20 MG PO CPDR: 20.0000 mg | DELAYED_RELEASE_CAPSULE | Freq: Every morning | ORAL | 5 refills | Status: DC

## 2017-03-10 MED ORDER — METFORMIN HCL 850 MG PO TABS: 850.0000 mg | ORAL_TABLET | Freq: Two times a day (BID) | ORAL | 5 refills | Status: DC

## 2017-03-10 MED ORDER — ATORVASTATIN CALCIUM 80 MG PO TABS: 80.0000 mg | ORAL_TABLET | ORAL | 5 refills | Status: DC

## 2017-03-10 NOTE — Progress Notes (Signed)
Name: [name] [name]   MRN: [id number]    DOB: 04-09-1961   Date:03/10/2017       Progress Note  Subjective  Chief Complaint  Chief Complaint  Patient presents with  . Medication Refill    4 month F/U  . Diabetes    Checks BS twice daily, Average 115-120's  . Hypertension  . Hyperlipidemia  . 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 Taking Metformin and hgbA1C is well controlled, he did not want to go down to once a day. Diagnosed in 07/2014. He is following a diabetic diet, exercising, down to twice a week because of cold weather. Denies polyphagia, polydipsia and polyuria. He is up to date with eye exam.   HTN: doing well, he is compliant with medication, Diovan and Carvedilol, no side  effects of medication   Hyperlipidemia: taking Atorvastatin, denies myalgia  GERD: under control with medication No heartburn or regurgitation   CAD: he has chest pain seldom, usually just with strenuos activity, unless moderate activity - sees cardiologist - Dr. Fletcher Anon - on Ranexa on statin, aspirin and beta-blocker. Dr. Fletcher Anon knows that he is taking Sildenafil.   Chronic neck pain with intermittent  radiculitis: having pain daily, using patches, seeing neurosurgeon at [facility], he was going to the pain clinic, he does no want to get steroid injections at this time. He states he has paresthesia daily. He told me earlier that he wanted a second opinion but does not want to drive to [place]. We will hold off on making the referral   Chronic Low back pain: using Flector patchers daily and is controlling pain, also uses ice pack. He stopped going to pain clinic - Dr. Levora Angel, because of the distance.   OSA: he wears CPAP every night, but still feels  tired during the day if he does not take Provigil  He has memory loss and seen by Dr. Melrose Nakayama  he had MRI brain but I will let Dr. Melrose Nakayama discuss with him. He is now on B12 shots. He also has headaches. Using old CPAP machine because the new one was not covered by Medicaid they need a copy of the last study.   Angina: he had MI, distal disease, on medical management, no recent episodes of chest pain. Last dose of NTG about 4 months ago.   Patient Active Problem List   Diagnosis Date Noted  . Rectal bleeding 02/11/2017  . Coronary artery disease involving native coronary artery of native heart   . Radiculitis of left cervical region 03/05/2016  . Diabetic neuropathy associated with type 2 diabetes mellitus ([place]) 12/05/2015  . Patellar subluxation 06/05/2015  . Allergic rhinitis, seasonal 06/05/2015  . Chronic constipation 06/05/2015  . Chronic LBP 06/05/2015  . Morbid obesity due to excess calories ([place]) 06/05/2015  . Vitamin D deficiency 06/05/2015  . Central sleep apnea 06/05/2015  . 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 Nerve root pain 06/05/2015  . Acquired polycythemia 06/05/2015  . Anterior knee pain 06/05/2015  . Dysmetabolic syndrome 45/85/9292  . Failure of erection 06/05/2015  . Gastro-esophageal reflux disease without esophagitis 06/05/2015  . Neuropathy ([place]) 06/05/2015  . CAD (coronary artery disease)   . Angina, class III (  [place]) 05/23/2015  . Essential hypertension 05/23/2015  . Hyperlipidemia 05/23/2015  . Inguinal hernia without mention of obstruction or gangrene, recurrent unilateral or unspecified 03/24/2013    Past Surgical History:  Procedure Laterality Date  . BACK SURGERY  12/2008   X2-LUMBAR  . CARDIAC CATHETERIZATION N/A 05/30/2015   Procedure: Left Heart Cath;  Surgeon: Wellington Hampshire, MD;  Location: [facility];  Service: Cardiovascular;  Laterality: N/A;  . CARDIAC CATHETERIZATION N/A 04/16/2016   Procedure: Left Heart Cath and Coronary Angiography;   Surgeon: Wellington Hampshire, MD;  Location: [facility];  Service: Cardiovascular;  Laterality: N/A;  . COLONOSCOPY  2014   Dr. [name]  . COLONOSCOPY WITH PROPOFOL N/A 12/25/2016   Procedure: COLONOSCOPY WITH PROPOFOL;  Surgeon: Jonathon Bellows, MD;  Location: [facility];  Service: Endoscopy;  Laterality: N/A;  . COLONOSCOPY WITH PROPOFOL N/A 01/29/2017   Procedure: COLONOSCOPY WITH PROPOFOL;  Surgeon: Jonathon Bellows, MD;  Location: [facility];  Service: Endoscopy;  Laterality: N/A;  . EVALUATION UNDER ANESTHESIA WITH HEMORRHOIDECTOMY N/A 02/24/2017   Procedure: EXAM UNDER ANESTHESIA WITH POSSIBLE EXCISION OF INTERNAL HEMORRHOIDS;  Surgeon: Olean Ree, MD;  Location: [facility];  Service: General;  Laterality: N/A;  . FISSURECTOMY  02/24/2017   Procedure: FISSURECTOMY;  Surgeon: Olean Ree, MD;  Location: [facility];  Service: General;;  . [place]  . INGUINAL HERNIA REPAIR Right 2012  . INGUINAL HERNIA REPAIR Right 2014    Family History  Problem Relation Age of Onset  . Heart Problems Mother   . Heart Problems Father 84    myocardial infarction  . Mental illness Brother     Social History   Social History  . Marital status: Single    Spouse name: N/A  . Number of children: N/A  . Years of education: N/A   Occupational History  . Not on file.   Social History Main Topics  . Smoking status: Former Smoker    Years: 2.00    Types: Cigars    Start date: 12/28/2002    Quit date: 06/04/2005  . Smokeless tobacco: Never Used  . Alcohol use 0.6 oz/week    1 Glasses of wine per week     Comment: [employer]  . Drug use: No  . Sexual activity: Yes    Partners: Female   Other Topics Concern  . Not on file   Social History Narrative  . No narrative on file     Current Outpatient Prescriptions:  .  ANUCORT-HC 25 MG suppository, place 1 suppository rectally twice a day, Disp: , Rfl: 0 .  aspirin 81 MG tablet, Take 81 mg by mouth daily., Disp: , Rfl:  .   atorvastatin (LIPITOR) 80 MG tablet, Take 1 tablet (80 mg total) by mouth every morning., Disp: 30 tablet, Rfl: 5 .  carvedilol (COREG) 12.5 MG tablet, Take 1 tablet (12.5 mg total) by mouth 2 (two) times daily., Disp: 60 tablet, Rfl: 3 .  cetirizine (ZYRTEC) 10 MG tablet, Take 10 mg by mouth daily as needed for allergies. , Disp: , Rfl: 1 .  diclofenac sodium (VOLTAREN) 1 % GEL, Apply 4 g topically 4 (four) times daily. (Patient taking differently: Apply 4 g topically 2 (two) times daily as needed. ), Disp: 100 g, Rfl: 2 .  dicyclomine (BENTYL) 10 MG capsule, Take 1 capsule (10 mg total) by mouth 3 (three) times daily before meals., Disp: 90 capsule, Rfl: 1 .  FLECTOR 1.3 %  PTCH, Place 1 patch onto the skin daily as needed (pain). , Disp: , Rfl: 0 .  fluticasone (FLONASE) 50 MCG/ACT nasal spray, Place 2 sprays into the nose daily as needed for allergies or rhinitis. , Disp: , Rfl:  .  ibuprofen (ADVIL,MOTRIN) 600 MG tablet, Take 1 tablet (600 mg total) by mouth every 8 (eight) hours as needed for fever or mild pain., Disp: 30 tablet, Rfl: 0 .  metFORMIN (GLUCOPHAGE) 850 MG tablet, Take 1 tablet (850 mg total) by mouth 2 (two) times daily with a meal., Disp: 60 tablet, Rfl: 5 .  modafinil (PROVIGIL) 200 MG tablet, Take 1 tablet (200 mg total) by mouth daily., Disp: 30 tablet, Rfl: 2 .  NITROSTAT 0.4 MG SL tablet, place 1 tablet under the tongue every 5 minutes for UP TO 3 doses if needed for angina as directed by prescriber, Disp: 25 tablet, Rfl: 1 .  nortriptyline (PAMELOR) 10 MG capsule, take 1 capsule by mouth at bedtime for 1 with then INCREASE TO 2 at bedtime, Disp: , Rfl: 1 .  omeprazole (PRILOSEC) 20 MG capsule, Take 1 capsule (20 mg total) by mouth every morning., Disp: 30 capsule, Rfl: 5 .  polyethylene glycol powder (MIRALAX) powder, Take 17 g by mouth daily., Disp: 500 g, Rfl: 0 .  RANEXA 1000 MG SR tablet, take 1 tablet by mouth twice a day, Disp: 60 tablet, Rfl: 5 .  Selenium  Sulf-Pyrithione-Urea (SELENIUM SULFIDE) 2.25 % SHAM, Apply 1 application topically daily as needed. , Disp: , Rfl: 0 .  Selenium Sulfide 2.25 % LOTN, Apply 5 mLs topically daily. (Patient taking differently: Apply 5 mLs topically daily as needed (dark spots on skin). ), Disp: 180 mL, Rfl: 2 .  sildenafil (REVATIO) 20 MG tablet, take 1-2 tablets by mouth once daily if needed (Patient taking differently: take 2 tablets by mouth once daily if needed), Disp: 60 tablet, Rfl: 2 .  valsartan (DIOVAN) 160 MG tablet, take 1 tablet by mouth once daily (Patient taking differently: take 1 tablet by mouth once daily-AM), Disp: 30 tablet, Rfl: 3  Allergies  Allergen Reactions  . Gabapentin Other (See Comments)    Groggy-Mood Changes  . Latex Rash  . Penicillins Hives and Rash    Has patient had a PCN reaction causing immediate rash, facial/tongue/throat swelling, SOB or lightheadedness with hypotension: Yes Has patient had a PCN reaction causing severe rash involving mucus membranes or skin necrosis: No Has patient had a PCN reaction that required hospitalization No Has patient had a PCN reaction occurring within the last 10 years: No If all of the above answers are "NO", then may proceed with Cephalosporin use.      ROS  Constitutional: Negative for fever or weight change.  Respiratory: Negative for cough and shortness of breath.   Cardiovascular: Negative for chest pain or palpitations.  Gastrointestinal: Negative for abdominal pain, no bowel changes.  Musculoskeletal: Negative for gait problem or joint swelling.  Skin: Negative for rash.  Neurological: Negative for dizziness, positive for  headache.  No other specific complaints in a complete review of systems (except as listed in HPI above).  Objective  Vitals:   03/10/17 1145  BP: 130/76  Pulse: (!) 104  Resp: 18  Temp: 98 F (36.7 C)  TempSrc: Oral  SpO2: 95%  Weight: 256 lb 9.6 oz (116.4 kg)  Height: _0  (1.803 m)    Body  mass index is 35.79 kg/m.  Physical Exam  Constitutional: Patient appears well-developed  and well-nourished. Obese No distress.  HEENT: head atraumatic, normocephalic, pupils equal and reactive to light,  neck supple, throat within normal limits Cardiovascular: Normal rate, regular rhythm and normal heart sounds.  No murmur heard. No BLE edema. Pulmonary/Chest: Effort normal and breath sounds normal. No respiratory distress. Abdominal: Soft.  There is no tenderness. Psychiatric: Patient has a normal mood and affect. behavior is normal. Judgment and thought content normal. Muscular Skeletal: some puffiness on upper part of his lumbar spine surgery, no redness or increase in warmth, para spinal pain during palpation, negative straight leg raise Neurological: no focal findings.   Recent Results (from the past 2160 hour(s))  CBC with Differential/Platelet     Status: None   Collection Time: 12/17/16 10:28 AM  Result Value Ref Range   WBC 8.5 3.8 - 10.6 K/uL   RBC 5.07 4.40 - 5.90 MIL/uL   Hemoglobin 16.5 13.0 - 18.0 g/dL   HCT 47.8 40.0 - 52.0 %   MCV 94.3 80.0 - 100.0 fL   MCH 32.4 26.0 - 34.0 pg   MCHC 34.4 32.0 - 36.0 g/dL   RDW 13.7 11.5 - 14.5 %   Platelets 212 150 - 440 K/uL   Neutrophils Relative % 58 %   Neutro Abs 5.0 1.4 - 6.5 K/uL   Lymphocytes Relative 32 %   Lymphs Abs 2.7 1.0 - 3.6 K/uL   Monocytes Relative 8 %   Monocytes Absolute 0.7 0.2 - 1.0 K/uL   Eosinophils Relative 1 %   Eosinophils Absolute 0.1 0 - 0.7 K/uL   Basophils Relative 1 %   Basophils Absolute 0.0 0 - 0.1 K/uL  C-reactive protein     Status: None   Collection Time: 12/17/16 10:28 AM  Result Value Ref Range   CRP <0.8 <1.0 mg/dL    Comment: Performed at [facility] metabolic panel     Status: Abnormal   Collection Time: 12/17/16 10:28 AM  Result Value Ref Range   Sodium 139 135 - 145 mmol/L   Potassium 3.2 (L) 3.5 - 5.1 mmol/L   Chloride 105 101 - 111 mmol/L   CO2 24 22 - 32  mmol/L   Glucose, Bld 114 (H) 65 - 99 mg/dL   BUN 9 6 - 20 mg/dL   Creatinine, Ser 0.83 0.61 - 1.24 mg/dL   Calcium 9.1 8.9 - 10.3 mg/dL   GFR calc non Af Amer >60 >60 mL/min   GFR calc Af Amer >60 >60 mL/min    Comment: (NOTE) The eGFR has been calculated using the CKD EPI equation. This calculation has not been validated in all clinical situations. eGFR's persistently <60 mL/min signify possible Chronic Kidney Disease.    Anion gap 10 5 - 15  Hepatic function panel     Status: None   Collection Time: 12/17/16 10:28 AM  Result Value Ref Range   Total Protein 7.9 6.5 - 8.1 g/dL   Albumin 4.4 3.5 - 5.0 g/dL   AST 36 15 - 41 U/L   ALT 36 17 - 63 U/L   Alkaline Phosphatase 63 38 - 126 U/L   Total Bilirubin 0.7 0.3 - 1.2 mg/dL   Bilirubin, Direct 0.1 0.1 - 0.5 mg/dL   Indirect Bilirubin 0.6 0.3 - 0.9 mg/dL  Gastrointestinal Panel by PCR , Stool     Status: None   Collection Time: 12/18/16  7:00 AM  Result Value Ref Range   Campylobacter species NOT DETECTED NOT DETECTED   Plesimonas shigelloides  NOT DETECTED NOT DETECTED   Salmonella species NOT DETECTED NOT DETECTED   Yersinia enterocolitica NOT DETECTED NOT DETECTED   Vibrio species NOT DETECTED NOT DETECTED   Vibrio cholerae NOT DETECTED NOT DETECTED   Enteroaggregative E coli (EAEC) NOT DETECTED NOT DETECTED   Enteropathogenic E coli (EPEC) NOT DETECTED NOT DETECTED   Enterotoxigenic E coli (ETEC) NOT DETECTED NOT DETECTED   Shiga like toxin producing E coli (STEC) NOT DETECTED NOT DETECTED   Shigella/Enteroinvasive E coli (EIEC) NOT DETECTED NOT DETECTED   Cryptosporidium NOT DETECTED NOT DETECTED   Cyclospora cayetanensis NOT DETECTED NOT DETECTED   Entamoeba histolytica NOT DETECTED NOT DETECTED   Giardia lamblia NOT DETECTED NOT DETECTED   Adenovirus F40/41 NOT DETECTED NOT DETECTED   Astrovirus NOT DETECTED NOT DETECTED   Norovirus GI/GII NOT DETECTED NOT DETECTED   Rotavirus A NOT DETECTED NOT DETECTED   Sapovirus  (I, II, IV, and V) NOT DETECTED NOT DETECTED  C difficile quick scan w PCR reflex     Status: None   Collection Time: 12/18/16  7:00 AM  Result Value Ref Range   C Diff antigen NEGATIVE NEGATIVE   C Diff toxin NEGATIVE NEGATIVE   C Diff interpretation No C. difficile detected.   Fecal lactoferrin     Status: None   Collection Time: 12/18/16  7:00 AM  Result Value Ref Range   Lactoferrin, Fecal, Quant. <1.00 0.00 - 7.24 ug/mL(g)    Comment: (NOTE) **Results verified by repeat testing**                        Baseline (normal)  0.00 - 7.24                        Elevated                 >7.24 An elevated result is indicative of the presence of fecal lactoferrin, a marker of intestinal inflammation. A normal result does not exclude the presence of intestinal inflammation. The test can be used as an in vitro diagnostic aid to distinguish patients with active inflammatory bowel disease (IBD) from those with non-inflammatory irritable bowel syndrome (IBS). Performed At: [facility] [address] [place], [place] [id number] Lindon Romp MD KP:[id number]   Glucose, capillary     Status: Abnormal   Collection Time: 12/25/16  8:57 AM  Result Value Ref Range   Glucose-Capillary 121 (H) 65 - 99 mg/dL   Comment 1 Document in Chart   HM DIABETES EYE EXAM     Status: None   Collection Time: 01/05/17 12:00 AM  Result Value Ref Range   HM Diabetic Eye Exam No Retinopathy No Retinopathy    Comment: [facility]-Dr. Annamaria Helling  POCT Influenza A/B     Status: Normal   Collection Time: 01/22/17 11:19 AM  Result Value Ref Range   Influenza A, POC Negative Negative   Influenza B, POC Negative Negative  Surgical pathology     Status: None   Collection Time: 01/29/17  8:57 AM  Result Value Ref Range   SURGICAL PATHOLOGY      Surgical Pathology CASE: [id number] PATIENT: Ander Gaster Surgical Pathology Report     SPECIMEN SUBMITTED: A. Colon polyp, ascending;  cbx B. Colon polyp x3, transverse; cold snare  CLINICAL HISTORY: None provided  PRE-OPERATIVE DIAGNOSIS: Hemorrhage of anus and rectum K62.5  POST-OPERATIVE DIAGNOSIS: Colon polyps, large hemorrhoids  DIAGNOSIS: A. COLON POLYP, ASCENDING; COLD BIOPSY: - TUBULAR ADENOMA. - NEGATIVE FOR HIGH-GRADE DYSPLASIA AND MALIGNANCY.  B.  COLON POLYP 2, TRANSVERSE; COLD SNARE: - TUBULAR ADENOMAS (2), NEGATIVE FOR HIGH-GRADE DYSPLASIA AND MALIGNANCY. - POLYPOID COLONIC MUCOSA WITH HEMORRHAGE OF THE LAMINA PROPRIA (1).   GROSS DESCRIPTION:  A. Labeled: ascending colon polyp C BX  Tissue fragment(s): 1  Size: 0.3 cm  Description: pink fragment  Entirely submitted in one cassette(s).   B. Labeled: transverse colon polyp cold snare 3  Tissue fragment(s): 3  Size: 0.2-0.3 cm  Description: pink to red fragment w ith a moderate amount of mucoid and fecal material  Entirely submitted in one cassette(s).    Final Diagnosis performed by Delorse Lek, MD.  Electronically signed 02/01/2017 9:46:15AM    The electronic signature indicates that the named Attending Pathologist has evaluated the specimen  Technical component performed at [facility], [address], [place], [place] [address] Lab: [phone] Dir: Darrick Penna. Evette Doffing, MD  Professional component performed at [facility], [facility], [place], [place], [place] [address] Lab: [phone] Dir: Dellia Nims. Rubinas, MD    Basic metabolic panel     Status: Abnormal   Collection Time: 02/22/17 10:51 AM  Result Value Ref Range   Sodium 140 135 - 145 mmol/L   Potassium 3.6 3.5 - 5.1 mmol/L   Chloride 107 101 - 111 mmol/L   CO2 25 22 - 32 mmol/L   Glucose, Bld 99 65 - 99 mg/dL   BUN 16 6 - 20 mg/dL   Creatinine, Ser 1.02 0.61 - 1.24 mg/dL   Calcium 8.8 (L) 8.9 - 10.3 mg/dL   GFR calc non Af Amer >60 >60 mL/min   GFR calc Af Amer >60 >60 mL/min    Comment: (NOTE) The eGFR has been calculated  using the CKD EPI equation. This calculation has not been validated in all clinical situations. eGFR's persistently <60 mL/min signify possible Chronic Kidney Disease.    Anion gap 8 5 - 15  Glucose, capillary     Status: Abnormal   Collection Time: 02/24/17 10:40 AM  Result Value Ref Range   Glucose-Capillary 35 (LL) 65 - 99 mg/dL    Comment: QUESTIONABLE RESULTS - CHARGE CREDITED REPEATED TO VERIFY Performed at [facility], [address] [place], [place] [address]    Comment 1 Repeat Test   Glucose, capillary     Status: Abnormal   Collection Time: 02/24/17 10:42 AM  Result Value Ref Range   Glucose-Capillary 100 (H) 65 - 99 mg/dL   Comment 1 Document in Chart   Surgical pathology     Status: None   Collection Time: 02/24/17  1:13 PM  Result Value Ref Range   SURGICAL PATHOLOGY      Surgical Pathology CASE: [id number] PATIENT: Ander Gaster Surgical Pathology Report     SPECIMEN SUBMITTED: A. Hemorrhoid  CLINICAL HISTORY: None provided  PRE-OPERATIVE DIAGNOSIS: Rectal bleeding  POST-OPERATIVE DIAGNOSIS: Same as pre-op     DIAGNOSIS: A. HEMORRHOIDS; HEMORRHOIDECTOMY: - HEMORRHOIDS. - NEGATIVE FOR DYSPLASIA AND MALIGNANCY.  Note: Anorectal mucosa is seen in this material.   GROSS DESCRIPTION:  A. Labeled: hemorrhoid  Tissue fragment(s): 1  Size: 3.4 x 1.5 x 0.6 cm  Description: unoriented purple tan fragment of skin and grossly, inked blue and sectioned  Representative submitted in 1 cassette(s).    Final Diagnosis performed by Delorse Lek, MD.  Electronically signed 02/25/2017 12:21:45PM    The electronic signature indicates that the  named Attending Pathologist has evaluated the specimen  Technical component performed at [facility], [address] [place], [place] [address] Lab: [phone] Dir: Darrick Penna. Veleta Miners D  Professional component performed at [facility], [facility], [place], [place],  [place] [address] Lab: [phone] Dir: Dellia Nims. Rubinas, MD    Glucose, capillary     Status: Abnormal   Collection Time: 02/24/17  1:48 PM  Result Value Ref Range   Glucose-Capillary 101 (H) 65 - 99 mg/dL  POCT HgB A1C     Status: Normal   Collection Time: 03/10/17 11:54 AM  Result Value Ref Range   Hemoglobin A1C 5.8   POCT UA - Microalbumin     Status: Abnormal   Collection Time: 03/10/17 11:54 AM  Result Value Ref Range   Microalbumin Ur, POC 50 mg/L   Creatinine, POC  mg/dL   Albumin/Creatinine Ratio, Urine, POC       PHQ2/9: Depression screen [facility] 2/9 03/10/2017 11/26/2016 07/10/2016 03/05/2016 12/05/2015  Decreased Interest 0 0 0 0 0  Down, Depressed, Hopeless 0 0 0 0 0  PHQ - 2 Score 0 0 0 0 0     Fall Risk: Fall Risk  03/10/2017 11/26/2016 07/10/2016 03/05/2016 12/05/2015  Falls in the past year? No No No Yes Yes  Number falls in past yr: - - - 2 or more 2 or more  Injury with Fall? - - - Yes Yes      Functional Status Survey: Is the patient deaf or have difficulty hearing?: No Does the patient have difficulty seeing, even when wearing glasses/contacts?: No Does the patient have difficulty concentrating, remembering, or making decisions?: No Does the patient have difficulty walking or climbing stairs?: No Does the patient have difficulty dressing or bathing?: No Does the patient have difficulty doing errands alone such as visiting a doctor's office or shopping?: No    Assessment & Plan  1. Controlled type 2 diabetes with neuropathy ([place])  Doing well, discussed going down on Metformin but he wants to do it next visit, urine micro has improved, continue diabetic diet - POCT HgB A1C - POCT UA - Microalbumin - metFORMIN (GLUCOPHAGE) 850 MG tablet; Take 1 tablet (850 mg total) by mouth 2 (two) times daily with a meal.  Dispense: 60 tablet; Refill: 5  2. Angina, class III ([place])  Doing well on aspirin, statin and carvedilol.   3. Essential hypertension  Well  controlled  4. Central sleep apnea  - modafinil (PROVIGIL) 200 MG tablet; Take 1 tablet (200 mg total) by mouth daily.  Dispense: 30 tablet; Refill: 2  5. Mixed hyperlipidemia  - atorvastatin (LIPITOR) 80 MG tablet; Take 1 tablet (80 mg total) by mouth every morning.  Dispense: 30 tablet; Refill: 5  6. Gastro-esophageal reflux disease without esophagitis  - omeprazole (PRILOSEC) 20 MG capsule; Take 1 capsule (20 mg total) by mouth every morning.  Dispense: 30 capsule; Refill: 5  7. Controlled type 2 diabetes mellitus with microalbuminuria, without long-term current use of insulin (HCC)  - metFORMIN (GLUCOPHAGE) 850 MG tablet; Take 1 tablet (850 mg total) by mouth 2 (two) times daily with a meal.  Dispense: 60 tablet; Refill: 5

## 2017-03-10 NOTE — Telephone Encounter (Signed)
Patient called and states Medicaid will not cover the Metformin 850 mg. This mg will cost him $200 a month, but they do cover the Metformin 500 mg. Will you please change this dosage due to the patient saying Dr. Ancil Boozer cut back his dosage during his visit today anyway.

## 2017-03-11 ENCOUNTER — Other Ambulatory Visit: Payer: Self-pay | Admitting: Family Medicine

## 2017-03-11 MED ORDER — METFORMIN HCL ER 750 MG PO TB24: 750.0000 mg | ORAL_TABLET | Freq: Every day | ORAL | 5 refills | Status: DC

## 2017-03-11 NOTE — Telephone Encounter (Signed)
Changed to 750 ER to take once daily

## 2017-03-15 ENCOUNTER — Other Ambulatory Visit: Payer: Self-pay

## 2017-03-17 ENCOUNTER — Encounter: Payer: Self-pay | Admitting: Surgery

## 2017-03-17 ENCOUNTER — Other Ambulatory Visit: Payer: Self-pay

## 2017-03-17 ENCOUNTER — Ambulatory Visit (INDEPENDENT_AMBULATORY_CARE_PROVIDER_SITE_OTHER): Payer: Medicaid Other | Admitting: Surgery

## 2017-03-17 VITALS — BP 159/98 | HR 61 | Temp 97.7°F | Wt 259.0 lb

## 2017-03-17 DIAGNOSIS — Z09 Encounter for follow-up examination after completed treatment for conditions other than malignant neoplasm: Secondary | ICD-10-CM

## 2017-03-17 MED ORDER — HYDROCORTISONE ACETATE 25 MG RE SUPP: 25.0000 mg | Freq: Two times a day (BID) | RECTAL | 0 refills | Status: DC

## 2017-03-17 NOTE — Patient Instructions (Signed)
Please continue to do the sitz baths twice a day and after you have a bowel movement.  Please give [place] a call to cancel your appointment if you do not need to come back.

## 2017-03-17 NOTE — Progress Notes (Signed)
03/17/2017  HPI: Patient is s/p hemorrhoidectomy and fissurectomy on 2/28 for an enlarged left lateral internal hemorrhoid and adjacent fissure.  He presents for follow up.  He reports that he's still having some soreness but has been improving.  He reports that sometimes after coughing hard he may notice some stool in the gauze.  Vital signs: BP (!) 159/98   Pulse 61   Temp 97.7 F (36.5 C) (Oral)   Wt 117.5 kg (259 lb)   BMI 36.12 kg/m    Physical Exam: Constitutional: No acute distress Rectal:  External exam only done as patient refused rectal exam.  Perianal skin reveals no lesions, with previous hemorrhoidectomy side healing well with some raw tissue still exposed but no evidence of infection or purulence or fluid collections.   Assessment/Plan: 56 yo male s/p hemorrhoidectomy and fissurectomy  --Advised patient to do Sitz baths twice daily and after each bowel movement. --Patient should continue taking Miralax to keep stools soft. --Prescription for anusol suppository given for pain control. --patient to follow up in 1 month.   Melvyn Neth, [place]

## 2017-03-24 ENCOUNTER — Telehealth: Payer: Self-pay | Admitting: Family Medicine

## 2017-03-24 NOTE — Telephone Encounter (Signed)
PT NEEDS [place] TO SEND AN NEW ORDER TO [facility] FOR HIS C-PAP MACHINE WITH SUPPLIES.

## 2017-03-25 NOTE — Telephone Encounter (Signed)
Please get it ready and I will send it

## 2017-03-25 NOTE — Telephone Encounter (Signed)
Informed patient it will be faxed today to [place]: [phone]

## 2017-04-01 ENCOUNTER — Encounter: Payer: Self-pay | Admitting: Surgery

## 2017-04-07 ENCOUNTER — Other Ambulatory Visit: Payer: Self-pay | Admitting: Family Medicine

## 2017-04-07 ENCOUNTER — Telehealth: Payer: Self-pay

## 2017-04-07 DIAGNOSIS — E1149 Type 2 diabetes mellitus with other diabetic neurological complication: Secondary | ICD-10-CM

## 2017-04-07 MED ORDER — ACCU-CHEK COMBO KIT: 1.0000 | PACK | Freq: Every day | 0 refills | Status: DC

## 2017-04-07 NOTE — Telephone Encounter (Signed)
Patient came into the office and stated he needed a new meter device kit due to his breaking. Please send in anything that is free due to his insurance Medicaid is very limited. Called in a prescription for Accu-Chek Device Kit and informed the pharmacist to fill what his insurance would cover with the meter, test strips and lancets. Patient notified in office.

## 2017-04-12 MED ORDER — ACCU-CHEK AVIVA PLUS W/DEVICE KIT: 1.0000 | PACK | Freq: Two times a day (BID) | Status: DC

## 2017-04-12 MED ORDER — ACCU-CHEK SOFT TOUCH LANCETS MISC: 12 refills | Status: DC

## 2017-04-15 ENCOUNTER — Other Ambulatory Visit: Payer: Self-pay | Admitting: Cardiovascular Disease

## 2017-04-15 DIAGNOSIS — I10 Essential (primary) hypertension: Secondary | ICD-10-CM

## 2017-04-21 ENCOUNTER — Encounter: Payer: Self-pay | Admitting: Surgery

## 2017-04-21 ENCOUNTER — Ambulatory Visit (INDEPENDENT_AMBULATORY_CARE_PROVIDER_SITE_OTHER): Payer: Medicaid Other | Admitting: Surgery

## 2017-04-21 VITALS — BP 142/94 | HR 57 | Temp 98.2°F | Ht 71.0 in | Wt 260.0 lb

## 2017-04-21 DIAGNOSIS — Z09 Encounter for follow-up examination after completed treatment for conditions other than malignant neoplasm: Secondary | ICD-10-CM

## 2017-04-21 NOTE — Patient Instructions (Signed)
Continue with a high fiber diet to ensure a soft bowel movement daily.  Use the Miralax when you are feeling constipated and/or the need to strain.  Start with using once a day if you start to experience diarrhea start using every other day.  We will not need to see you back in the office but if you need [place] for anything feel free to give our office a call.

## 2017-04-21 NOTE — Progress Notes (Signed)
04/21/2017  HPI: Patient is s/p left lateral internal hemorrhoidectomy and fissurectomy on 2/28.  He presents today for further follow up.  Today he reports only occasional blood that he notices about 1 in 5 bowel movements on the tissue paper.  Denies any significant tenderness and does not take any pain medications.  No longer doing sitz baths and not taking miralax.  Denies any significant constipation.  Vital signs: BP (!) 142/94   Pulse (!) 57   Temp 98.2 F (36.8 C) (Oral)   Ht 5\' 11"  (1.803 m)   Wt 117.9 kg (260 lb)   BMI 36.26 kg/m    Physical Exam: Constitutional: No acute distress Rectal:  Perianal skin with no wounds or fissures, with hemorrhoidectomy side well healed.  On rectal exam, he has good rectal tone without tenderness on exam.  No palpable enlarged hemorrhoidal tissue or other lesions.  Assessment/Plan: 56 yo male s/p hemorrhoidectomy and fissurectomy.  --Recommended that the patient continue a high fiber diet.  The goal would be so he has a daily soft bowel movement.  He may add Miralax once daily and titrate the frequency based on his bowel movements and how close to goal he is.  No other medications are needed at this point.  The bleeding that he's experiencing may be due to a harder bowel movement that causes irritation to hemorrhoids or the prior wound. --No further procedures needed.  The patient may follow up on an as needed basis.   Melvyn Neth, [place]

## 2017-04-28 DIAGNOSIS — M5137 Other intervertebral disc degeneration, lumbosacral region: Secondary | ICD-10-CM | POA: Diagnosis not present

## 2017-04-28 DIAGNOSIS — G4733 Obstructive sleep apnea (adult) (pediatric): Secondary | ICD-10-CM | POA: Diagnosis not present

## 2017-05-19 ENCOUNTER — Other Ambulatory Visit: Payer: Self-pay | Admitting: Cardiovascular Disease

## 2017-05-20 ENCOUNTER — Telehealth: Payer: Self-pay | Admitting: *Deleted

## 2017-05-20 NOTE — Telephone Encounter (Signed)
Pt requiring PA for generic Valsartan 160 mg tablet.  Pt must have failed or tried Losartan or Brand name Diovan 160 mg tablet.  PA has been started and can check status in 24 hours  PA #[id number]

## 2017-05-25 NOTE — Telephone Encounter (Signed)
Pt has been approved for Valsartan 160 mg tablet 05/20/17-05/15/18.

## 2017-05-28 DIAGNOSIS — M5137 Other intervertebral disc degeneration, lumbosacral region: Secondary | ICD-10-CM | POA: Diagnosis not present

## 2017-05-28 DIAGNOSIS — G4733 Obstructive sleep apnea (adult) (pediatric): Secondary | ICD-10-CM | POA: Diagnosis not present

## 2017-06-14 ENCOUNTER — Ambulatory Visit: Payer: Medicaid Other | Admitting: Family Medicine

## 2017-06-27 DIAGNOSIS — G4733 Obstructive sleep apnea (adult) (pediatric): Secondary | ICD-10-CM | POA: Diagnosis not present

## 2017-06-27 DIAGNOSIS — M5137 Other intervertebral disc degeneration, lumbosacral region: Secondary | ICD-10-CM | POA: Diagnosis not present

## 2017-06-28 ENCOUNTER — Encounter: Payer: Self-pay | Admitting: Family Medicine

## 2017-06-28 ENCOUNTER — Ambulatory Visit (INDEPENDENT_AMBULATORY_CARE_PROVIDER_SITE_OTHER): Payer: Medicaid Other | Admitting: Family Medicine

## 2017-06-28 VITALS — BP 142/88 | HR 56 | Temp 97.7°F | Resp 16 | Ht 71.0 in | Wt 256.7 lb

## 2017-06-28 DIAGNOSIS — E1149 Type 2 diabetes mellitus with other diabetic neurological complication: Secondary | ICD-10-CM | POA: Diagnosis not present

## 2017-06-28 DIAGNOSIS — E782 Mixed hyperlipidemia: Secondary | ICD-10-CM

## 2017-06-28 DIAGNOSIS — R809 Proteinuria, unspecified: Secondary | ICD-10-CM

## 2017-06-28 DIAGNOSIS — E538 Deficiency of other specified B group vitamins: Secondary | ICD-10-CM | POA: Diagnosis not present

## 2017-06-28 DIAGNOSIS — E1129 Type 2 diabetes mellitus with other diabetic kidney complication: Secondary | ICD-10-CM | POA: Diagnosis not present

## 2017-06-28 DIAGNOSIS — G4731 Primary central sleep apnea: Secondary | ICD-10-CM

## 2017-06-28 DIAGNOSIS — I10 Essential (primary) hypertension: Secondary | ICD-10-CM

## 2017-06-28 DIAGNOSIS — I209 Angina pectoris, unspecified: Secondary | ICD-10-CM

## 2017-06-28 LAB — POCT GLYCOSYLATED HEMOGLOBIN (HGB A1C): HEMOGLOBIN A1C: 6.1

## 2017-06-28 MED ORDER — B-12 1000 MCG SL SUBL: 1.0000 | SUBLINGUAL_TABLET | Freq: Every day | SUBLINGUAL | 0 refills | Status: DC

## 2017-06-28 MED ORDER — METFORMIN HCL ER 750 MG PO TB24: ORAL_TABLET | ORAL | 5 refills | Status: DC

## 2017-06-28 NOTE — Progress Notes (Signed)
Name: [name] [name]   MRN: [id number]    DOB: [birth year]   Date:06/28/2017       Progress Note  Subjective  Chief Complaint  Chief Complaint  Patient presents with  . Medication Refill  . Diabetes    Checks once a day, Average-110  . Hypertension    Denies any symptoms  . Hyperlipidemia  . Gastroesophageal Reflux    Well controlled  . Sleep Apnea    Has CPAP Machine and doing well with it.    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 Taking Metformin and hgbA1C is well controlled. Diagnosed in 07/2014. He is following a diabetic diet, exercising, walking again.  Denies polyphagia, polydipsia and polyuria. He is up to date with eye exam.   HTN: doing well, bp is elevated today , did not take morning medication yet,  he is compliant with medication, Diovan and Carvedilol, no side  effects of medication   Hyperlipidemia: taking Atorvastatin, denies myalgia  GERD: under control with medication No heartburn or regurgitation   CAD: he has chest pain seldom, but he has SOB with moderate activity - sees cardiologist - Dr. Fletcher Anon - on Ranexa on statin, aspirin and beta-blocker. Dr. Fletcher Anon knows that he is taking Sildenafil.   Chronic neck pain with intermittent radiculitis: having pain daily, using patches, seeing [facility], he was going to the pain clinic, he does no want to get steroid injections at this time. He states he has paresthesia daily, he is on B12 given by neurologist. He never get an appointment from [facility], he requested a second opinion   Chronic Low back pain: using Flector patchers daily and is controlling pain, also uses ice pack. He stopped going to pain clinic - Dr. Levora Angel, because of the distance.   OSA: he wears CPAP every night,  but still feels tired during the day if he does not take Provigil  He has memory loss and seen by Dr. Melrose Nakayama  he had MRI brain but I will let Dr. Melrose Nakayama discuss with him.  He also has headaches. He has a new CPAP machine   Patient Active Problem List   Diagnosis Date Noted  . B12 deficiency 06/28/2017  . Headache disorder 02/15/2017  . Memory loss or impairment 02/15/2017  . Rectal bleeding 02/11/2017  . Coronary artery disease involving native coronary artery of native heart   . Radiculitis of left cervical region 03/05/2016  . Diabetic neuropathy associated with type 2 diabetes mellitus ([place]) 12/05/2015  . Patellar subluxation 06/05/2015  . Allergic rhinitis, seasonal 06/05/2015  . Chronic constipation 06/05/2015  . Chronic LBP 06/05/2015  . Morbid obesity due to excess calories ([place]) 06/05/2015  . Vitamin D deficiency 06/05/2015  . Central sleep apnea 06/05/2015  . 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 Nerve root pain 06/05/2015  . Acquired polycythemia 06/05/2015  . Anterior knee pain 06/05/2015  . Dysmetabolic syndrome 38/93/7342  . Failure of erection 06/05/2015  . Gastro-esophageal reflux disease without esophagitis 06/05/2015  . Neuropathy 06/05/2015  . CAD (coronary artery disease)   . Angina, class III ([place]) 05/23/2015  . Essential hypertension 05/23/2015  . Hyperlipidemia 05/23/2015  . Inguinal hernia without mention of obstruction or gangrene, recurrent unilateral or  unspecified 03/24/2013    Past Surgical History:  Procedure Laterality Date  . BACK SURGERY  12/2008   X2-LUMBAR  . CARDIAC CATHETERIZATION N/A 05/30/2015   Procedure: Left Heart Cath;  Surgeon: Wellington Hampshire, MD;  Location: [place] [facility];  Service: Cardiovascular;  Laterality: N/A;  . CARDIAC CATHETERIZATION N/A 04/16/2016   Procedure: Left Heart Cath and Coronary Angiography;  Surgeon: Wellington Hampshire, MD;  Location: [place] [facility];  Service: Cardiovascular;  Laterality: N/A;  . COLONOSCOPY   2014   Dr. [name]  . COLONOSCOPY WITH PROPOFOL N/A 12/25/2016   Procedure: COLONOSCOPY WITH PROPOFOL;  Surgeon: Jonathon Bellows, MD;  Location: [facility] END[facility];  Service: Endoscopy;  Laterality: N/A;  . COLONOSCOPY WITH PROPOFOL N/A 01/29/2017   Procedure: COLONOSCOPY WITH PROPOFOL;  Surgeon: Jonathon Bellows, MD;  Location: [facility] ENDOSCOPY;  Service: Endoscopy;  Laterality: N/A;  . EVALUATION UNDER ANESTHESIA WITH HEMORRHOIDECTOMY N/A 02/24/2017   Procedure: EXAM UNDER ANESTHESIA WITH POSSIBLE EXCISION OF INTERNAL HEMORRHOIDS;  Surgeon: Olean Ree, MD;  Location: [facility] ORS;  Service: General;  Laterality: N/A;  . FISSURECTOMY  02/24/2017   Procedure: FISSURECTOMY;  Surgeon: Olean Ree, MD;  Location: [facility] ORS;  Service: General;;  . [place]  . INGUINAL HERNIA REPAIR Right 2012  . INGUINAL HERNIA REPAIR Right 2014    Family History  Problem Relation Age of Onset  . Heart Problems Mother   . Heart Problems Father 40       myocardial infarction  . Mental illness Brother     Social History   Social History  . Marital status: Single    Spouse name: N/A  . Number of children: N/A  . Years of education: N/A   Occupational History  . Not on file.   Social History Main Topics  . Smoking status: Former Smoker    Years: 2.00    Types: Cigars    Start date: 12/28/2002    Quit date: 06/04/2005  . Smokeless tobacco: Never Used  . Alcohol use 0.6 oz/week    1 Glasses of wine per week     Comment: [employer]  . Drug use: No  . Sexual activity: Yes    Partners: Female   Other Topics Concern  . Not on file   Social History Narrative  . No narrative on file     Current Outpatient Prescriptions:  .  ACCU-CHEK SMARTVIEW test strip, , Disp: , Rfl: 0 .  aspirin 81 MG tablet, Take 81 mg by mouth daily., Disp: , Rfl:  .  atorvastatin (LIPITOR) 80 MG tablet, Take 1 tablet (80 mg total) by mouth every morning., Disp: 30 tablet, Rfl: 5 .  Blood Glucose Monitoring Suppl (ACCU-CHEK AVIVA  PLUS) w/Device KIT, 1 kit by Does not apply route 2 (two) times daily., Disp: 1 kit, Rfl:  .  carvedilol (COREG) 12.5 MG tablet, take 1 tablet by mouth twice a day, Disp: 60 tablet, Rfl: 0 .  cetirizine (ZYRTEC) 10 MG tablet, Take 10 mg by mouth daily as needed for allergies. , Disp: , Rfl: 1 .  diclofenac sodium (VOLTAREN) 1 % GEL, Apply 4 g topically 4 (four) times daily. (Patient taking differently: Apply 4 g topically 2 (two) times daily as needed. ), Disp: 100 g, Rfl: 2 .  dicyclomine (BENTYL) 10 MG capsule, Take 1 capsule (10 mg total) by mouth 3 (three) times daily before meals., Disp: 90 capsule, Rfl: 1 .  FLECTOR 1.3 % PTCH, Place 1 patch  onto the skin daily as needed (pain). , Disp: , Rfl: 0 .  fluticasone (FLONASE) 50 MCG/ACT nasal spray, Place 2 sprays into the nose daily as needed for allergies or rhinitis. , Disp: , Rfl:  .  Insulin Infusion Pump (ACCU-CHEK COMBO) KIT, 1 kit by Does not apply route daily., Disp: 1 kit, Rfl: 0 .  Lancets (ACCU-CHEK SOFT TOUCH) lancets, Use as instructed, Disp: 100 each, Rfl: 12 .  metFORMIN (GLUCOPHAGE-XR) 750 MG 24 hr tablet, take 1 tablet by mouth once daily with BREAKFAST, Disp: 30 tablet, Rfl: 5 .  modafinil (PROVIGIL) 200 MG tablet, Take 1 tablet (200 mg total) by mouth daily., Disp: 30 tablet, Rfl: 2 .  NITROSTAT 0.4 MG SL tablet, place 1 tablet under the tongue every 5 minutes for UP TO 3 doses if needed for angina as directed by prescriber, Disp: 25 tablet, Rfl: 1 .  nortriptyline (PAMELOR) 10 MG capsule, take 1 capsule by mouth at bedtime for 1 with then INCREASE TO 2 at bedtime, Disp: , Rfl: 1 .  omeprazole (PRILOSEC) 20 MG capsule, Take 1 capsule (20 mg total) by mouth every morning., Disp: 30 capsule, Rfl: 5 .  RANEXA 1000 MG SR tablet, take 1 tablet by mouth twice a day, Disp: 60 tablet, Rfl: 5 .  Selenium Sulf-Pyrithione-Urea (SELENIUM SULFIDE) 2.25 % SHAM, Apply 1 application topically daily as needed. , Disp: , Rfl: 0 .  Selenium Sulfide  2.25 % LOTN, Apply 5 mLs topically daily. (Patient taking differently: Apply 5 mLs topically daily as needed (dark spots on skin). ), Disp: 180 mL, Rfl: 2 .  sildenafil (REVATIO) 20 MG tablet, take 1-2 tablets by mouth once daily if needed (Patient taking differently: take 2 tablets by mouth once daily if needed), Disp: 60 tablet, Rfl: 2 .  valsartan (DIOVAN) 160 MG tablet, take 1 tablet by mouth once daily, Disp: 30 tablet, Rfl: 0 .  Cyanocobalamin (B-12) 1000 MCG SUBL, Place 1 tablet under the tongue daily., Disp: 30 each, Rfl: 0  Allergies  Allergen Reactions  . Gabapentin Other (See Comments)    Groggy-Mood Changes  . Latex Rash  . Penicillins Hives and Rash    Has patient had a PCN reaction causing immediate rash, facial/tongue/throat swelling, SOB or lightheadedness with hypotension: Yes Has patient had a PCN reaction causing severe rash involving mucus membranes or skin necrosis: No Has patient had a PCN reaction that required hospitalization No Has patient had a PCN reaction occurring within the last 10 years: No If all of the above answers are "NO", then may proceed with Cephalosporin use.      ROS  Constitutional: Negative for fever or significant  weight change.  Respiratory: Negative for cough , still has  shortness of breath with moderate activity.   Cardiovascular: Negative for chest pain or palpitations.  Gastrointestinal: Negative for abdominal pain, no bowel changes.  Musculoskeletal: Positive for gait problem - when he walks for a long time, but no joint swelling.  Skin: Negative for rash.  Neurological: Negative for dizziness , positive for intermittent  headache.  No other specific complaints in a complete review of systems (except as listed in HPI above).  Objective  Vitals:   06/28/17 1049  BP: (!) 142/88  Pulse: (!) 56  Resp: 16  Temp: 97.7 F (36.5 C)  TempSrc: Oral  SpO2: 95%  Weight: 256 lb 11.2 oz (116.4 kg)  Height: _0  (1.803 m)    Body  mass index is 35.8 kg/m.  Physical Exam  Constitutional: Patient appears well-developed and well-nourished. Obese  No distress.  HEENT: head atraumatic, normocephalic, pupils equal and reactive to light, neck supple, throat within normal limits Cardiovascular: Normal rate, regular rhythm and normal heart sounds.  No murmur heard. No BLE edema. Pulmonary/Chest: Effort normal and breath sounds normal. No respiratory distress. Abdominal: Soft.  There is no tenderness. Psychiatric: Patient has a normal mood and affect. behavior is normal. Judgment and thought content normal.  Recent Results (from the past 2160 hour(s))  POCT HgB A1C     Status: Normal   Collection Time: 06/28/17 10:54 AM  Result Value Ref Range   Hemoglobin A1C 6.1       PHQ2/9: Depression screen [facility] 2/9 06/28/2017 03/10/2017 11/26/2016 07/10/2016 03/05/2016  Decreased Interest 0 0 0 0 0  Down, Depressed, Hopeless 0 0 0 0 0  PHQ - 2 Score 0 0 0 0 0     Fall Risk: Fall Risk  06/28/2017 03/10/2017 11/26/2016 07/10/2016 03/05/2016  Falls in the past year? No No No No Yes  Number falls in past yr: - - - - 2 or more  Injury with Fall? - - - - Yes     Functional Status Survey: Is the patient deaf or have difficulty hearing?: No Does the patient have difficulty seeing, even when wearing glasses/contacts?: No Does the patient have difficulty concentrating, remembering, or making decisions?: No Does the patient have difficulty walking or climbing stairs?: No Does the patient have difficulty dressing or bathing?: No Does the patient have difficulty doing errands alone such as visiting a doctor's office or shopping?: No   Assessment & Plan  1. Other diabetic neurological complication associated with type 2 diabetes mellitus ([place])  At goal, continue physical activity  - POCT HgB A1C - metFORMIN (GLUCOPHAGE-XR) 750 MG 24 hr tablet; take 1 tablet by mouth once daily with BREAKFAST  Dispense: 30 tablet; Refill: 5  2. Morbid  obesity due to excess calories ([place])  Discussed life style modification again, he lost  4 lbs since last visit   3. Angina, class III ([place])  Still has SOB with some activity   4. Essential hypertension  Slightly elevated, but he did not take medication daily   5. Central sleep apnea  Wearing CPAP every night.   6. Mixed hyperlipidemia  Continue medication   7. Controlled type 2 diabetes mellitus with microalbuminuria, without long-term current use of insulin (HCC)  - metFORMIN (GLUCOPHAGE-XR) 750 MG 24 hr tablet; take 1 tablet by mouth once daily with BREAKFAST  Dispense: 30 tablet; Refill: 5  8. B12 deficiency  - Cyanocobalamin (B-12) 1000 MCG SUBL; Place 1 tablet under the tongue daily.  Dispense: 30 each; Refill: 0

## 2017-07-19 ENCOUNTER — Ambulatory Visit (INDEPENDENT_AMBULATORY_CARE_PROVIDER_SITE_OTHER): Payer: Medicaid Other | Admitting: Cardiovascular Disease

## 2017-07-19 ENCOUNTER — Encounter: Payer: Self-pay | Admitting: Cardiovascular Disease

## 2017-07-19 VITALS — BP 150/100 | HR 71 | Ht 71.0 in | Wt 255.5 lb

## 2017-07-19 DIAGNOSIS — E782 Mixed hyperlipidemia: Secondary | ICD-10-CM

## 2017-07-19 DIAGNOSIS — I10 Essential (primary) hypertension: Secondary | ICD-10-CM

## 2017-07-19 DIAGNOSIS — R0602 Shortness of breath: Secondary | ICD-10-CM | POA: Diagnosis not present

## 2017-07-19 DIAGNOSIS — I25118 Atherosclerotic heart disease of native coronary artery with other forms of angina pectoris: Secondary | ICD-10-CM

## 2017-07-19 MED ORDER — LOSARTAN POTASSIUM 100 MG PO TABS: 100.0000 mg | ORAL_TABLET | Freq: Every day | ORAL | 3 refills | Status: DC

## 2017-07-19 NOTE — Patient Instructions (Addendum)
Medication Instructions:  Your physician has recommended you make the following change in your medication:  STOP taking valsartan START taking losartan 100mg  once daily   Labwork: none  Testing/Procedures: Your physician has requested that you have an echocardiogram. Echocardiography is a painless test that uses sound waves to create images of your heart. It provides your doctor with information about the size and shape of your heart and how well your heart's chambers and valves are working. This procedure takes approximately one hour. There are no restrictions for this procedure.    Follow-Up: Your physician recommends that you schedule a follow-up appointment in: one month with Christell Faith, PA-C or Ignacia Bayley, NP   Any Other Special Instructions Will Be Listed Below (If Applicable).     If you need a refill on your cardiac medications before your next appointment, please call your pharmacy.  Echocardiogram An echocardiogram, or echocardiography, uses sound waves (ultrasound) to produce an image of your heart. The echocardiogram is simple, painless, obtained within a short period of time, and offers valuable information to your health care provider. The images from an echocardiogram can provide information such as:  Evidence of coronary artery disease (CAD).  Heart size.  Heart muscle function.  Heart valve function.  Aneurysm detection.  Evidence of a past heart attack.  Fluid buildup around the heart.  Heart muscle thickening.  Assess heart valve function.  Tell a health care provider about:  Any allergies you have.  All medicines you are taking, including vitamins, herbs, eye drops, creams, and over-the-counter medicines.  Any problems you or family members have had with anesthetic medicines.  Any blood disorders you have.  Any surgeries you have had.  Any medical conditions you have.  Whether you are pregnant or may be pregnant. What happens before the  procedure? No special preparation is needed. Eat and drink normally. What happens during the procedure?  In order to produce an image of your heart, gel will be applied to your chest and a wand-like tool (transducer) will be moved over your chest. The gel will help transmit the sound waves from the transducer. The sound waves will harmlessly bounce off your heart to allow the heart images to be captured in real-time motion. These images will then be recorded.  You may need an IV to receive a medicine that improves the quality of the pictures. What happens after the procedure? You may return to your normal schedule including diet, activities, and medicines, unless your health care provider tells you otherwise. This information is not intended to replace advice given to you by your health care provider. Make sure you discuss any questions you have with your health care provider. Document Released: 12/11/2000 Document Revised: 08/01/2016 Document Reviewed: 08/21/2013 Elsevier Interactive Patient Education  2017 [employer].

## 2017-07-19 NOTE — Progress Notes (Signed)
Cardiology Office Note   Date:  07/19/2017   ID:  [name] [name], [name] [name] 10/27/1961, MRN [id number]  PCP:  [name] Sizer, MD  Cardiologist:   [name] Sacramento, MD   Chief Complaint  Patient presents with  . other    6 month follow up. Meds reviewed by the pt. verbally. Pt. c/o taking Valsartan and needs to be changed. Pt. c/o shortness of breath with activity.       History of Present Illness: [name] [name] is a 56 y.o. male who presents for A follow-up visit regarding mild -moderate one-vessel coronary artery disease with stable angina and suspected endothelial dysfunction. He has known history of type 2 diabetes, hypertension, hyperlipidemia and obesity.He is a previous smoker. Cardiac catheterization in June 2016 showed moderate mid LAD stenosis (50%) with an FFR ratio of 0.83 . LV systolic function with normal with normal left ventricular end-diastolic pressure. He had worsening angina in April 2017. Repeat cardiac catheterization showed improvement in mid LAD stenosis to 30% and no evidence of obstructive coronary artery disease. The patient has been treated medically with Ranexa with subsequent improvement in symptoms.  Since his last visit, had issues withHemorrhoids and underwent colonoscopy and surgery. Since then, he noticed worsening exertional chest pain and shortness of breath. He stopped taking Diovan after the recent recall and did not get a substitute medication. His blood pressure has been elevated. He does have sleep apnea but has been using CPAP regularly.    Past Medical History:  Diagnosis Date  . Allergy   . Angina, class III ([place])   . CAD (coronary artery disease)    a. cardiac cath 05/30/2015: LM nl, mLAD 50%, LCx minor irregs, RCA minor irregs, EF 55-65%, no WMA, no MR/AS, nl LVEDP, recommend aggressive med Rx  . Chronic constipation   . Chronic low blood pressure   . Degeneration of intervertebral disc of cervical region   . Diabetes mellitus without  complication ([name] [name])   . Dyspnea   . GERD (gastroesophageal reflux disease)   . Hernia 1991   02/10/2012-RIH repair  . Hyperlipidemia   . Hypertension 2008  . Inguinal hernia without mention of obstruction or gangrene, recurrent unilateral or unspecified 2012  . Nerve root pain   . Neuropathy   . Obesity, unspecified 2012  . Personal history of tobacco use, presenting hazards to health 2012  . Polycythemia   . Sleep apnea    CPAP  . Umbilical hernia without mention of obstruction or gangrene 2012  . Vitamin D deficiency     Past Surgical History:  Procedure Laterality Date  . BACK SURGERY  12/2008   X2-LUMBAR  . CARDIAC CATHETERIZATION N/A 05/30/2015   Procedure: Left Heart Cath;  Surgeon: Wellington Hampshire, MD;  Location: [facility];  Service: Cardiovascular;  Laterality: N/A;  . CARDIAC CATHETERIZATION N/A 04/16/2016   Procedure: Left Heart Cath and Coronary Angiography;  Surgeon: Wellington Hampshire, MD;  Location: [facility];  Service: Cardiovascular;  Laterality: N/A;  . COLONOSCOPY  2014   Dr. [name]  . COLONOSCOPY WITH PROPOFOL N/A 12/25/2016   Procedure: COLONOSCOPY WITH PROPOFOL;  Surgeon: Jonathon Bellows, MD;  Location: [facility];  Service: Endoscopy;  Laterality: N/A;  . COLONOSCOPY WITH PROPOFOL N/A 01/29/2017   Procedure: COLONOSCOPY WITH PROPOFOL;  Surgeon: Jonathon Bellows, MD;  Location: [facility];  Service: Endoscopy;  Laterality: N/A;  . EVALUATION UNDER ANESTHESIA WITH HEMORRHOIDECTOMY N/A 02/24/2017   Procedure: EXAM UNDER ANESTHESIA WITH  POSSIBLE EXCISION OF INTERNAL HEMORRHOIDS;  Surgeon: Olean Ree, MD;  Location: [facility];  Service: General;  Laterality: N/A;  . FISSURECTOMY  02/24/2017   Procedure: FISSURECTOMY;  Surgeon: Olean Ree, MD;  Location: [facility];  Service: General;;  . [place]  . INGUINAL HERNIA REPAIR Right 2012  . INGUINAL HERNIA REPAIR Right 2014     Current Outpatient Prescriptions  Medication Sig Dispense  Refill  . ACCU-CHEK SMARTVIEW test strip   0  . aspirin 81 MG tablet Take 81 mg by mouth daily.    Marland Kitchen atorvastatin (LIPITOR) 80 MG tablet Take 1 tablet (80 mg total) by mouth every morning. 30 tablet 5  . Blood Glucose Monitoring Suppl (ACCU-CHEK AVIVA PLUS) w/Device KIT 1 kit by Does not apply route 2 (two) times daily. 1 kit   . carvedilol (COREG) 12.5 MG tablet take 1 tablet by mouth twice a day 60 tablet 0  . cetirizine (ZYRTEC) 10 MG tablet Take 10 mg by mouth daily as needed for allergies.   1  . Cyanocobalamin (B-12) 1000 MCG SUBL Place 1 tablet under the tongue daily. 30 each 0  . diclofenac sodium (VOLTAREN) 1 % GEL Apply 4 g topically 4 (four) times daily. (Patient taking differently: Apply 4 g topically 2 (two) times daily as needed. ) 100 g 2  . dicyclomine (BENTYL) 10 MG capsule Take 1 capsule (10 mg total) by mouth 3 (three) times daily before meals. 90 capsule 1  . FLECTOR 1.3 % PTCH Place 1 patch onto the skin daily as needed (pain).   0  . fluticasone (FLONASE) 50 MCG/ACT nasal spray Place 2 sprays into the nose daily as needed for allergies or rhinitis.     . Insulin Infusion Pump (ACCU-CHEK COMBO) KIT 1 kit by Does not apply route daily. 1 kit 0  . Lancets (ACCU-CHEK SOFT TOUCH) lancets Use as instructed 100 each 12  . metFORMIN (GLUCOPHAGE-XR) 750 MG 24 hr tablet take 1 tablet by mouth once daily with BREAKFAST 30 tablet 5  . modafinil (PROVIGIL) 200 MG tablet Take 1 tablet (200 mg total) by mouth daily. 30 tablet 2  . NITROSTAT 0.4 MG SL tablet place 1 tablet under the tongue every 5 minutes for UP TO 3 doses if needed for angina as directed by prescriber 25 tablet 1  . nortriptyline (PAMELOR) 10 MG capsule take 1 capsule by mouth at bedtime for 1 with then INCREASE TO 2 at bedtime  1  . omeprazole (PRILOSEC) 20 MG capsule Take 1 capsule (20 mg total) by mouth every morning. 30 capsule 5  . RANEXA 1000 MG SR tablet take 1 tablet by mouth twice a day 60 tablet 5  . Selenium  Sulf-Pyrithione-Urea (SELENIUM SULFIDE) 2.25 % SHAM Apply 1 application topically daily as needed.   0  . Selenium Sulfide 2.25 % LOTN Apply 5 mLs topically daily. (Patient taking differently: Apply 5 mLs topically daily as needed (dark spots on skin). ) 180 mL 2  . sildenafil (REVATIO) 20 MG tablet take 1-2 tablets by mouth once daily if needed (Patient taking differently: take 2 tablets by mouth once daily if needed) 60 tablet 2  . valsartan (DIOVAN) 160 MG tablet take 1 tablet by mouth once daily 30 tablet 0   No current facility-administered medications for this visit.     Allergies:   Gabapentin; Latex; and Penicillins    Social History:  The patient  reports that he quit smoking about 12 years ago.  His smoking use included Cigars. He started smoking about 14 years ago. He quit after 2.00 years of use. He has never used smokeless tobacco. He reports that he drinks about 0.6 oz of alcohol per week . He reports that he does not use drugs.   Family History:  The patient's family history includes Heart Problems in his mother; Heart Problems (age of onset: 65) in his father; Mental illness in his brother.    ROS:  Please see the history of present illness.   Otherwise, review of systems are positive for none.   All other systems are reviewed and negative.    PHYSICAL EXAM: VS:  BP (!) 150/100 (BP Location: Left Arm, Patient Position: Sitting, Cuff Size: Normal) Comment: Pt. has not taken medications today.  Pulse 71   Ht '5\' 11"'  (1.803 m)   Wt 255 lb 8 oz (115.9 kg)   BMI 35.64 kg/m  , BMI Body mass index is 35.64 kg/m. GEN: Well nourished, well developed, in no acute distress  HEENT: normal  Neck: no JVD, carotid bruits, or masses Cardiac: RRR; no murmurs, rubs, or gallops,no edema  Respiratory:  clear to auscultation bilaterally, normal work of breathing GI: soft, nontender, nondistended, + BS MS: no deformity or atrophy  Skin: warm and dry, no rash Neuro:  Strength and sensation  are intact Psych: euthymic mood, full affect   EKG:  EKG is  ordered today.  EKG shows normal sinus rhythm with possible left atrial enlargement and anterolateral changes suggestive of ischemia. No significant change from before.  Recent Labs: 12/17/2016: ALT 36; Hemoglobin 16.5; Platelets 212 02/22/2017: BUN 16; Creatinine, Ser 1.02; Potassium 3.6; Sodium 140    Lipid Panel    Component Value Date/Time   CHOL 167 05/08/2016 0803   TRIG 149 05/08/2016 0803   HDL 52 05/08/2016 0803   CHOLHDL 3.2 05/08/2016 0803   LDLCALC 85 05/08/2016 0803      Wt Readings from Last 3 Encounters:  07/19/17 255 lb 8 oz (115.9 kg)  06/28/17 256 lb 11.2 oz (116.4 kg)  04/21/17 260 lb (117.9 kg)        ASSESSMENT AND PLAN:  1.  Coronary artery disease involving native coronary arteries with other forms of angina:  The patient has suspected endothelial dysfunction. Cardiac catheterization twice showed no evidence of obstructive coronary artery disease.  Recent worsening of angina likely due to stopping Diovan and uncontrolled hypertension. I elected to start him on losartan 100 mg once daily. I ordered an echocardiogram to evaluate LV systolic/diastolic function and pulmonary pressure. Reevaluate symptoms after echocardiogram.  2. Hyperlipidemia:  Continue treatment with high dose atorvastatin. Most recent LDL was 85.  3. Essential hypertension: Blood pressure is elevated today. I added losartan as outlined above.    Disposition:   FU with me in 1 months  Signed,  [name] Sacramento, MD  07/19/2017 3:25 PM    [place]

## 2017-07-21 ENCOUNTER — Other Ambulatory Visit: Payer: Self-pay | Admitting: Cardiovascular Disease

## 2017-07-21 ENCOUNTER — Other Ambulatory Visit: Payer: Self-pay | Admitting: Family Medicine

## 2017-07-21 DIAGNOSIS — I10 Essential (primary) hypertension: Secondary | ICD-10-CM

## 2017-07-22 ENCOUNTER — Other Ambulatory Visit: Payer: Self-pay

## 2017-07-22 ENCOUNTER — Ambulatory Visit (INDEPENDENT_AMBULATORY_CARE_PROVIDER_SITE_OTHER): Payer: Medicaid Other

## 2017-07-22 DIAGNOSIS — R0602 Shortness of breath: Secondary | ICD-10-CM

## 2017-07-28 DIAGNOSIS — G4733 Obstructive sleep apnea (adult) (pediatric): Secondary | ICD-10-CM | POA: Diagnosis not present

## 2017-07-28 DIAGNOSIS — M5137 Other intervertebral disc degeneration, lumbosacral region: Secondary | ICD-10-CM | POA: Diagnosis not present

## 2017-08-04 ENCOUNTER — Ambulatory Visit (INDEPENDENT_AMBULATORY_CARE_PROVIDER_SITE_OTHER): Payer: Medicaid Other | Admitting: Family Medicine

## 2017-08-04 ENCOUNTER — Encounter: Payer: Self-pay | Admitting: Family Medicine

## 2017-08-04 VITALS — BP 136/86 | HR 67 | Temp 97.7°F | Resp 16 | Wt 253.9 lb

## 2017-08-04 DIAGNOSIS — M545 Low back pain, unspecified: Secondary | ICD-10-CM

## 2017-08-04 DIAGNOSIS — M79645 Pain in left finger(s): Secondary | ICD-10-CM | POA: Diagnosis not present

## 2017-08-04 DIAGNOSIS — M5412 Radiculopathy, cervical region: Secondary | ICD-10-CM | POA: Diagnosis not present

## 2017-08-04 DIAGNOSIS — Z9181 History of falling: Secondary | ICD-10-CM

## 2017-08-04 MED ORDER — PREDNISONE 10 MG (48) PO TBPK: ORAL_TABLET | ORAL | 0 refills | Status: DC

## 2017-08-04 MED ORDER — TIZANIDINE HCL 4 MG PO TABS: 4.0000 mg | ORAL_TABLET | Freq: Four times a day (QID) | ORAL | 0 refills | Status: DC | PRN

## 2017-08-04 NOTE — Progress Notes (Signed)
Name: [name] [name]   MRN: [id number]    DOB: [birth year]   Date:08/04/2017       Progress Note  Subjective  Chief Complaint  Chief Complaint  Patient presents with  . Fall    Onset- Saturday he was pressure washing his house and fell off of his ladder. Patient states he tried to catch himself and hurt his thumb. He has been using heat and ice.  . Hypertension    Valsartan was recalled and needs a new prescription.   . Erectile Dysfunction    Cialis was not covered.     HPI  HTN; he is doing well, changed from Valsartan to Losartan and is doing well, bp is at goal and not side effects. No chest pain or palpation.   Recent Fall: he was pressure washing his house four days ago  and slid down his ladder and half way down he jumped backwards and the ladder fell on top of him ( 10 feet ladder), he states he was okay immediately after, but later that day his whole body was hurting. Did not sleep well that night. He states that two days ago developed tingling and numbness radiating down left hand, left thumb pain, lower back pain with radiculitis. He is having a lot of spasms on left upper back, neck and left shoulder feels stiff and tender.    Patient Active Problem List   Diagnosis Date Noted  . B12 deficiency 06/28/2017  . Headache disorder 02/15/2017  . Memory loss or impairment 02/15/2017  . Rectal bleeding 02/11/2017  . Coronary artery disease involving native coronary artery of native heart   . Radiculitis of left cervical region 03/05/2016  . Diabetic neuropathy associated with type 2 diabetes mellitus ([place]) 12/05/2015  . Patellar subluxation 06/05/2015  . Allergic rhinitis, seasonal 06/05/2015  . Chronic constipation 06/05/2015  . Chronic LBP 06/05/2015  . Morbid obesity due to excess calories ([place]) 06/05/2015  . Vitamin D deficiency 06/05/2015  . Central sleep apnea 06/05/2015  . 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 Nerve root pain 06/05/2015  . Acquired polycythemia 06/05/2015  .  Anterior knee pain 06/05/2015  . Dysmetabolic syndrome 28/36/6294  . Failure of erection 06/05/2015  . Gastro-esophageal reflux disease without esophagitis 06/05/2015  . Neuropathy 06/05/2015  . CAD (coronary artery disease)   . Angina, class III ([place]) 05/23/2015  . Essential hypertension 05/23/2015  . Hyperlipidemia 05/23/2015  . Inguinal hernia without mention of obstruction or gangrene, recurrent unilateral or unspecified 03/24/2013    Past Surgical History:  Procedure Laterality Date  . BACK SURGERY  12/2008   X2-LUMBAR  . CARDIAC CATHETERIZATION N/A 05/30/2015   Procedure: Left Heart Cath;  Surgeon: Wellington Hampshire, MD;  Location: [facility];  Service: Cardiovascular;  Laterality: N/A;  . CARDIAC CATHETERIZATION N/A 04/16/2016   Procedure: Left Heart Cath and Coronary Angiography;  Surgeon: Wellington Hampshire, MD;  Location: [facility];  Service: Cardiovascular;  Laterality: N/A;  . COLONOSCOPY  2014   Dr. [name]  . COLONOSCOPY WITH PROPOFOL N/A 12/25/2016   Procedure: COLONOSCOPY WITH PROPOFOL;  Surgeon: Jonathon Bellows, MD;  Location: [facility];  Service: Endoscopy;  Laterality: N/A;  . COLONOSCOPY WITH PROPOFOL N/A 01/29/2017   Procedure: COLONOSCOPY WITH PROPOFOL;  Surgeon: Jonathon Bellows, MD;  Location: [facility];  Service: Endoscopy;  Laterality: N/A;  . EVALUATION UNDER ANESTHESIA WITH HEMORRHOIDECTOMY N/A 02/24/2017   Procedure: EXAM UNDER ANESTHESIA WITH POSSIBLE EXCISION OF INTERNAL HEMORRHOIDS;  Surgeon: Olean Ree,  MD;  Location: [facility];  Service: General;  Laterality: N/A;  . FISSURECTOMY  02/24/2017   Procedure: FISSURECTOMY;  Surgeon: Olean Ree, MD;  Location: [facility];  Service: General;;  . [place]  . INGUINAL HERNIA REPAIR Right 2012  . INGUINAL HERNIA REPAIR Right 2014    Family History  Problem Relation Age of Onset  . Heart Problems Mother   . Heart Problems Father 58       myocardial infarction  . Mental illness  Brother     Social History   Social History  . Marital status: Single    Spouse name: N/A  . Number of children: N/A  . Years of education: N/A   Occupational History  . Not on file.   Social History Main Topics  . Smoking status: Former Smoker    Years: 2.00    Types: Cigars    Start date: 12/28/2002    Quit date: 06/04/2005  . Smokeless tobacco: Never Used  . Alcohol use 0.6 oz/week    1 Glasses of wine per week     Comment: [employer]  . Drug use: No  . Sexual activity: Yes    Partners: Female   Other Topics Concern  . Not on file   Social History Narrative  . No narrative on file     Current Outpatient Prescriptions:  .  ACCU-CHEK SMARTVIEW test strip, , Disp: , Rfl: 0 .  aspirin 81 MG tablet, Take 81 mg by mouth daily., Disp: , Rfl:  .  atorvastatin (LIPITOR) 80 MG tablet, Take 1 tablet (80 mg total) by mouth every morning., Disp: 30 tablet, Rfl: 5 .  Blood Glucose Monitoring Suppl (ACCU-CHEK AVIVA PLUS) w/Device KIT, 1 kit by Does not apply route 2 (two) times daily., Disp: 1 kit, Rfl:  .  carvedilol (COREG) 12.5 MG tablet, take 1 tablet by mouth twice a day, Disp: 60 tablet, Rfl: 3 .  cetirizine (ZYRTEC) 10 MG tablet, Take 10 mg by mouth daily as needed for allergies. , Disp: , Rfl: 1 .  Cyanocobalamin (B-12) 1000 MCG SUBL, Place 1 tablet under the tongue daily., Disp: 30 each, Rfl: 0 .  diclofenac sodium (VOLTAREN) 1 % GEL, Apply 4 g topically 4 (four) times daily. (Patient taking differently: Apply 4 g topically 2 (two) times daily as needed. ), Disp: 100 g, Rfl: 2 .  dicyclomine (BENTYL) 10 MG capsule, Take 1 capsule (10 mg total) by mouth 3 (three) times daily before meals., Disp: 90 capsule, Rfl: 1 .  FLECTOR 1.3 % PTCH, Place 1 patch onto the skin daily as needed (pain). , Disp: , Rfl: 0 .  fluticasone (FLONASE) 50 MCG/ACT nasal spray, Place 2 sprays into the nose daily as needed for allergies or rhinitis. , Disp: , Rfl:  .  Insulin Infusion Pump (ACCU-CHEK  COMBO) KIT, 1 kit by Does not apply route daily., Disp: 1 kit, Rfl: 0 .  Lancets (ACCU-CHEK SOFT TOUCH) lancets, Use as instructed, Disp: 100 each, Rfl: 12 .  losartan (COZAAR) 100 MG tablet, Take 1 tablet (100 mg total) by mouth daily., Disp: 90 tablet, Rfl: 3 .  metFORMIN (GLUCOPHAGE-XR) 750 MG 24 hr tablet, take 1 tablet by mouth once daily with BREAKFAST, Disp: 30 tablet, Rfl: 5 .  modafinil (PROVIGIL) 200 MG tablet, Take 1 tablet (200 mg total) by mouth daily., Disp: 30 tablet, Rfl: 2 .  NITROSTAT 0.4 MG SL tablet, place 1 tablet under the tongue every 5 minutes  for UP TO 3 doses if needed for angina as directed by prescriber, Disp: 25 tablet, Rfl: 1 .  nortriptyline (PAMELOR) 10 MG capsule, take 1 capsule by mouth at bedtime for 1 with then INCREASE TO 2 at bedtime, Disp: , Rfl: 1 .  omeprazole (PRILOSEC) 20 MG capsule, Take 1 capsule (20 mg total) by mouth every morning., Disp: 30 capsule, Rfl: 5 .  RANEXA 1000 MG SR tablet, take 1 tablet by mouth twice a day, Disp: 60 tablet, Rfl: 3 .  Selenium Sulf-Pyrithione-Urea (SELENIUM SULFIDE) 2.25 % SHAM, Apply 1 application topically daily as needed. , Disp: , Rfl: 0 .  Selenium Sulfide 2.25 % LOTN, Apply 5 mLs topically daily. (Patient taking differently: Apply 5 mLs topically daily as needed (dark spots on skin). ), Disp: 180 mL, Rfl: 2 .  predniSONE (STERAPRED UNI-PAK 48 TAB) 10 MG (48) TBPK tablet, Take as directed taper, Disp: 42 tablet, Rfl: 0 .  sildenafil (REVATIO) 20 MG tablet, take 1-2 tablets by mouth daily if needed (Patient not taking: Reported on 08/04/2017), Disp: 60 tablet, Rfl: 2 .  tiZANidine (ZANAFLEX) 4 MG tablet, Take 1 tablet (4 mg total) by mouth every 6 (six) hours as needed for muscle spasms., Disp: 30 tablet, Rfl: 0  Allergies  Allergen Reactions  . Gabapentin Other (See Comments)    Groggy-Mood Changes  . Latex Rash  . Penicillins Hives and Rash    Has patient had a PCN reaction causing immediate rash,  facial/tongue/throat swelling, SOB or lightheadedness with hypotension: Yes Has patient had a PCN reaction causing severe rash involving mucus membranes or skin necrosis: No Has patient had a PCN reaction that required hospitalization No Has patient had a PCN reaction occurring within the last 10 years: No If all of the above answers are "NO", then may proceed with Cephalosporin use.      ROS  Ten systems reviewed and is negative except as mentioned in HPI   Objective  Vitals:   08/04/17 1036  BP: 136/86  Pulse: 67  Resp: 16  Temp: 97.7 F (36.5 C)  TempSrc: Oral  SpO2: 97%  Weight: 253 lb 14.4 oz (115.2 kg)    Body mass index is 35.41 kg/m.  Physical Exam  Constitutional: Patient appears well-developed and well-nourished. Obese  No distress.  HEENT: head atraumatic, normocephalic, pupils equal and reactive to light, neck supple, throat within normal limits Cardiovascular: Normal rate, regular rhythm and normal heart sounds.  No murmur heard. No BLE edema. Pulmonary/Chest: Effort normal and breath sounds normal. No respiratory distress. Abdominal: Soft.  There is no tenderness. Psychiatric: Patient has a normal mood and affect. behavior is normal. Judgment and thought content normal. Muscular Skeletal: tender during palpation of left upper back ( trapezium) and lower back bilateral, normal abduction and internal rotation of both shoulders, positive empty can sign ( on left - states from old injury)    Recent Results (from the past 2160 hour(s))  POCT HgB A1C     Status: Normal   Collection Time: 06/28/17 10:54 AM  Result Value Ref Range   Hemoglobin A1C 6.1      PHQ2/9: Depression screen [facility] 2/9 06/28/2017 03/10/2017 11/26/2016 07/10/2016 03/05/2016  Decreased Interest 0 0 0 0 0  Down, Depressed, Hopeless 0 0 0 0 0  PHQ - 2 Score 0 0 0 0 0     Fall Risk: Fall Risk  06/28/2017 03/10/2017 11/26/2016 07/10/2016 03/05/2016  Falls in the past year? No No No No Yes  Number  falls in past yr: - - - - 2 or more  Injury with Fall? - - - - Yes  Comment - - - - pulled muscle     Assessment & Plan  1. History of recent fall  Discussed safety to avoid future falls.   2. Thumb pain, left  Continue topical medication  3. Acute bilateral low back pain without sciatica  - tiZANidine (ZANAFLEX) 4 MG tablet; Take 1 tablet (4 mg total) by mouth every 6 (six) hours as needed for muscle spasms.  Dispense: 30 tablet; Refill: 0  4. Radiculitis of left cervical region   If no improvement we will get MRI cervical spine, discussed possible side effects of medication, take with food and avoid taking prior to bedtime, advised him to be very strict with diabetic diet, since prednisone causes hyperglycemia, monitor glucose more often at home while taking prednisone - predniSONE (STERAPRED UNI-PAK 48 TAB) 10 MG (48) TBPK tablet; Take as directed taper  Dispense: 42 tablet; Refill: 0

## 2017-08-10 ENCOUNTER — Telehealth: Payer: Self-pay

## 2017-08-10 NOTE — Telephone Encounter (Signed)
Patient states he does not like the way Prednisone makes him feel, he feels like he is jumping out of his skin and very agitated. Also patient states his back is still bothering him and wondered could you referred him out for his back pain so he could get another back brace his last one broke.

## 2017-08-10 NOTE — Telephone Encounter (Signed)
Called and left message to ask where patient referred to go for his back pain.

## 2017-08-10 NOTE — Telephone Encounter (Signed)
Where does he want to go? New pain doctor in town? [facility]?

## 2017-08-11 ENCOUNTER — Telehealth: Payer: Self-pay | Admitting: Family Medicine

## 2017-08-11 DIAGNOSIS — G8929 Other chronic pain: Secondary | ICD-10-CM

## 2017-08-11 DIAGNOSIS — M545 Other chronic pain: Secondary | ICD-10-CM

## 2017-08-11 DIAGNOSIS — M5412 Radiculopathy, cervical region: Secondary | ICD-10-CM

## 2017-08-11 NOTE — Telephone Encounter (Signed)
PT SAID THAT HE DID NOT WANT TO GO TO [place] WANTS SOMETHING IN THIS AREA. SAYS ANY DAY OR TIME WILL BE FINE WITH HIM.

## 2017-08-11 NOTE — Telephone Encounter (Signed)
Patient can be seen at [facility] by Dr. Gillis Santa.   New referral has been placed.

## 2017-08-24 ENCOUNTER — Ambulatory Visit (INDEPENDENT_AMBULATORY_CARE_PROVIDER_SITE_OTHER): Payer: Medicaid Other | Admitting: Nurse Practitioner

## 2017-08-24 ENCOUNTER — Encounter: Payer: Self-pay | Admitting: Nurse Practitioner

## 2017-08-24 VITALS — BP 140/82 | HR 62 | Ht 71.0 in | Wt 254.0 lb

## 2017-08-24 DIAGNOSIS — I10 Essential (primary) hypertension: Secondary | ICD-10-CM

## 2017-08-24 DIAGNOSIS — E782 Mixed hyperlipidemia: Secondary | ICD-10-CM

## 2017-08-24 DIAGNOSIS — I25119 Atherosclerotic heart disease of native coronary artery with unspecified angina pectoris: Secondary | ICD-10-CM

## 2017-08-24 DIAGNOSIS — E119 Type 2 diabetes mellitus without complications: Secondary | ICD-10-CM | POA: Diagnosis not present

## 2017-08-24 DIAGNOSIS — R079 Chest pain, unspecified: Secondary | ICD-10-CM

## 2017-08-24 MED ORDER — ISOSORBIDE MONONITRATE ER 30 MG PO TB24: 30.0000 mg | ORAL_TABLET | Freq: Every day | ORAL | 3 refills | Status: DC

## 2017-08-24 NOTE — Patient Instructions (Signed)
Medication Instructions:  Your physician has recommended you make the following change in your medication:  1- START Imdur 30 mg (1 tablet) by mouth once a day.   Labwork: none  Testing/Procedures: none  Follow-Up: Your physician recommends that you schedule a follow-up appointment in: [place].   If you need a refill on your cardiac medications before your next appointment, please call your pharmacy.

## 2017-08-24 NOTE — Progress Notes (Signed)
Office Visit    Patient Name: [name] [name] Date of Encounter: 08/24/2017  Primary Care Provider:  Steele Sizer, MD Primary Cardiologist:  Jerilynn Mages. Fletcher Anon, MD   Chief Complaint    56 year old male with a history of nonobstructive CAD and endothelial dysfunction, hypertension, hyperlipidemia, diabetes, obesity, diastolic dysfunction, and sleep apnea, who presents for follow-up related to elevated blood pressures and chest pain.  Past Medical History    Past Medical History:  Diagnosis Date  . Allergy   . Angina, class III ([name] [name])   . Chronic constipation   . Degeneration of intervertebral disc of cervical region   . Diabetes mellitus without complication ([name] [name])   . Diastolic dysfunction    a. 06/2017 Echo: EF 60-65%, no rwma, Gr1 DD.  Marland Kitchen Dyspnea   . GERD (gastroesophageal reflux disease)   . Hernia 1991   02/10/2012-RIH repair  . Hyperlipidemia   . Hypertension 2008  . Inguinal hernia without mention of obstruction or gangrene, recurrent unilateral or unspecified 2012  . Nerve root pain   . Neuropathy   . Non-obstructive CAD (coronary artery disease)    a. 05/30/2015 cath: LM nl, mLAD 50% (FFR 0.83), LCx minor irregs, RCA minor irregs, EF 55-65%-->Med Rx; b. 03/2016 Cath: LM nl LAD 73m D1/2/3 min irregs, LCX min irregs, OM1/2/3 nl, RCA min irregs, RPDA/RPAV/RPL1/RPL2 nl-->Med Rx.  . Obesity, unspecified 2012  . Personal history of tobacco use, presenting hazards to health 2012  . Polycythemia   . Sleep apnea    a. On CPAP;  b. 06/2017 Echo: no PAH.  .Marland KitchenUmbilical hernia without mention of obstruction or gangrene 2012  . Vitamin D deficiency    Past Surgical History:  Procedure Laterality Date  . BACK SURGERY  12/2008   X2-LUMBAR  . CARDIAC CATHETERIZATION N/A 05/30/2015   Procedure: Left Heart Cath;  Surgeon: MWellington Hampshire MD;  [facility];  Service: Cardiovascular;  Laterality: N/A;  . CARDIAC CATHETERIZATION N/A 04/16/2016   Procedure: Left Heart Cath and  Coronary Angiography;  Surgeon: MWellington Hampshire MD;  [facility];  Service: Cardiovascular;  Laterality: N/A;  . COLONOSCOPY  2014   Dr. SJamal Collin . COLONOSCOPY WITH PROPOFOL N/A 12/25/2016   Procedure: COLONOSCOPY WITH PROPOFOL;  Surgeon: KJonathon Bellows MD;  Location: [facility];  Service: Endoscopy;  Laterality: N/A;  . COLONOSCOPY WITH PROPOFOL N/A 01/29/2017   Procedure: COLONOSCOPY WITH PROPOFOL;  Surgeon: KJonathon Bellows MD;  Location: [facility];  Service: Endoscopy;  Laterality: N/A;  . EVALUATION UNDER ANESTHESIA WITH HEMORRHOIDECTOMY N/A 02/24/2017   Procedure: EXAM UNDER ANESTHESIA WITH POSSIBLE EXCISION OF INTERNAL HEMORRHOIDS;  Surgeon: JOlean Ree MD;  Location: [facility];  Service: General;  Laterality: N/A;  . FISSURECTOMY  02/24/2017   Procedure: FISSURECTOMY;  Surgeon: JOlean Ree MD;  Location: [facility];  Service: General;;  . [place] . INGUINAL HERNIA REPAIR Right 2012  . INGUINAL HERNIA REPAIR Right 2014    Allergies  Allergies  Allergen Reactions  . Gabapentin Other (See Comments)    Groggy-Mood Changes  . Latex Rash  . Penicillins Hives and Rash    Has patient had a PCN reaction causing immediate rash, facial/tongue/throat swelling, SOB or lightheadedness with hypotension: Yes Has patient had a PCN reaction causing severe rash involving mucus membranes or skin necrosis: No Has patient had a PCN reaction that required hospitalization No Has patient had a PCN reaction occurring within the last 10 years: No  If all of the above answers are "NO", then may proceed with Cephalosporin use.     History of Present Illness    56 year old ? With the above past medical history including nonobstructive CAD status post catheterization in June 2016 showing a 50% mid LAD stenosis and otherwise minor irregularities. Fractional flow reserve in the mid LAD was 0.83 and therefore medical therapy was recommended. He had recurrent chest pain prompting  diagnostic catheterization in April 2017, showing 30% mid LAD stenosis and otherwise minor irregularities. He has been medically managed since and presumptively has endothelial dysfunction. Other history includes hypertension, hyperlipidemia, diabetes, sleep apnea, and obesity. He has been managed with beta blocker, aspirin, statin, ARB, and Ranexa therapy. He recently ran out of his ARB in the setting of the valsartan recall and was seen in clinic July 23 with elevated blood pressures and complaints of increasing exertional chest pain and dyspnea. He was placed on losartan with recommendation for follow-up today. He says that over the past month, his blood pressure has been much better controlled at home, typically in the 130s over 80s. He has continued to have intermittent exertional chest discomfort which is not predictable, in the sense that he can sometimes be very active with no limitations and about once a week or so, he is likely to have chest discomfort requiring sublingual nitroglycerin despite usual activities. Chest pain usually resolves within a few minutes but he might feel fatigued up to 2 hours after an episode. He denies PND, orthopnea, dyspnea, palpitations, dizziness, syncope, edema, or early satiety.  Home Medications    Prior to Admission medications   Medication Sig Start Date End Date Taking? Authorizing Provider  ACCU-CHEK SMARTVIEW test strip  05/22/17  Yes [provider]  aspirin 81 MG tablet Take 81 mg by mouth daily.   Yes [provider]  atorvastatin (LIPITOR) 80 MG tablet Take 1 tablet (80 mg total) by mouth every morning. 03/10/17  Yes Sowles, Drue Stager, MD  Blood Glucose Monitoring Suppl (ACCU-CHEK AVIVA PLUS) w/Device KIT 1 kit by Does not apply route 2 (two) times daily. 04/12/17  Yes Sowles, Drue Stager, MD  carvedilol (COREG) 12.5 MG tablet take 1 tablet by mouth twice a day 07/21/17  Yes Wellington Hampshire, MD  cetirizine (ZYRTEC) 10 MG tablet Take 10 mg by  mouth daily as needed for allergies.  07/09/16  Yes [provider]  Cyanocobalamin (B-12) 1000 MCG SUBL Place 1 tablet under the tongue daily. 06/28/17  Yes Sowles, Drue Stager, MD  diclofenac sodium (VOLTAREN) 1 % GEL Apply 4 g topically 4 (four) times daily. Patient taking differently: Apply 4 g topically 2 (two) times daily as needed.  11/27/16  Yes Sowles, Drue Stager, MD  dicyclomine (BENTYL) 10 MG capsule Take 1 capsule (10 mg total) by mouth 3 (three) times daily before meals. 02/10/17  Yes Jonathon Bellows, MD  FLECTOR 1.3 % [facility] 1 patch onto the skin daily as needed (pain).  12/07/16  Yes [provider]  fluticasone (FLONASE) 50 MCG/ACT nasal spray Place 2 sprays into the nose daily as needed for allergies or rhinitis.  03/05/15  Yes [provider]  Insulin Infusion Pump (ACCU-CHEK COMBO) KIT 1 kit by Does not apply route daily. 04/07/17  Yes Sowles, Drue Stager, MD  Lancets (ACCU-CHEK SOFT TOUCH) lancets Use as instructed 04/12/17  Yes Sowles, Drue Stager, MD  losartan (COZAAR) 100 MG tablet Take 1 tablet (100 mg total) by mouth daily. 07/19/17 10/17/17 Yes Wellington Hampshire, MD  metFORMIN (  GLUCOPHAGE-XR) 750 MG 24 hr tablet take 1 tablet by mouth once daily with BREAKFAST 06/28/17  Yes Sowles, Drue Stager, MD  modafinil (PROVIGIL) 200 MG tablet Take 1 tablet (200 mg total) by mouth daily. 03/10/17  Yes Sowles, Drue Stager, MD  NITROSTAT 0.4 MG SL tablet place 1 tablet under the tongue every 5 minutes for UP TO 3 doses if needed for angina as directed by prescriber 01/15/17  Yes Wellington Hampshire, MD  nortriptyline (PAMELOR) 10 MG capsule take 1 capsule by mouth at bedtime for 1 with then INCREASE TO 2 at bedtime 02/17/17  Yes [provider]  omeprazole (PRILOSEC) 20 MG capsule Take 1 capsule (20 mg total) by mouth every morning. 03/10/17  Yes Sowles, Drue Stager, MD  predniSONE (STERAPRED UNI-PAK 48 TAB) 10 MG (48) TBPK tablet Take as directed taper 08/04/17  Yes Sowles, Drue Stager, MD  RANEXA  1000 MG SR tablet take 1 tablet by mouth twice a day 07/21/17  Yes Wellington Hampshire, MD  Selenium Sulf-Pyrithione-Urea (SELENIUM SULFIDE) 2.25 % SHAM Apply 1 application topically daily as needed.  11/27/16  Yes [provider]  Selenium Sulfide 2.25 % LOTN Apply 5 mLs topically daily. Patient taking differently: Apply 5 mLs topically daily as needed (dark spots on skin).  11/26/16  Yes Sowles, Drue Stager, MD  sildenafil (REVATIO) 20 MG tablet take 1-2 tablets by mouth daily if needed 07/21/17  Yes Sowles, Drue Stager, MD  tiZANidine (ZANAFLEX) 4 MG tablet Take 1 tablet (4 mg total) by mouth every 6 (six) hours as needed for muscle spasms. 08/04/17  Yes Sowles, Drue Stager, MD    Review of Systems    As above, he continues to have intermittent exertional chest discomfort that is somewhat unpredictable in nature. He denies dyspnea, palpitations, PND, orthopnea, dizziness, syncope, edema, or early satiety.  All other systems reviewed and are otherwise negative except as noted above.  Physical Exam    VS:  BP 140/82 (BP Location: Left Arm, Patient Position: Sitting, Cuff Size: Normal)   Pulse 62   Ht _0  (1.803 m)   Wt 254 lb (115.2 kg)   BMI 35.43 kg/m  , BMI Body mass index is 35.43 kg/m. GEN: Well nourished, well developed, in no acute distress.  HEENT: normal.  Neck: Obese, Supple, no JVD, carotid bruits, or masses. Cardiac: RRR, no murmurs, rubs, or gallops. No clubbing, cyanosis, edema.  Radials/DP/PT 2+ and equal bilaterally.  Respiratory:  Respirations regular and unlabored, clear to auscultation bilaterally. GI: Obese, Soft, nontender, nondistended, BS + x 4. MS: no deformity or atrophy. Skin: warm and dry, no rash. Neuro:  Strength and sensation are intact. Psych: Normal affect.  Accessory Clinical Findings    ECG - Regular sinus rhythm, 62 probable left atrial enlargement, nonspecific T changes. No acute changes.  Assessment & Plan    1.  Nonobstructive coronary artery  disease with endothelial dysfunction/exertional chest pain: Patient with known mild to moderate LAD disease with normal fractional flow reserve by catheterization 2016 in stable anatomy in 2017. He has somewhat chronic, intermittent exertional chest discomfort that occurs without rhyme or reason about once a week. He continues to have this despite improved blood pressure control since his last visit. We reviewed his medicines today. He has never tried a long-acting nitrate. I will add Imdur 30 mg daily and otherwise continue aspirin, statin, beta blocker, ARB, and Ranexa therapy.  2. Essential hypertension: Blood pressure mildly elevated today at 140/82. He says pressures have been in the 130s over  80s at home. I am adding low-dose long-acting nitrate as above.  3. Hyperlipidemia: He remains on statin therapy with an LDL of 85 in May 2017.  4. Type 2 diabetes mellitus: He is now back on metformin and this is being managed by primary care.  Hemoglobin A1c was 6.1 in July, up from 5.8 in March.  5. Morbid obesity: We briefly discussed the importance of regular activity and calorie restrictions with the goal of 1 pound weight loss per week.  6. Obstructive sleep apnea: Using CPAP.  7. Disposition: Follow-up in clinic in 4-6 weeks or sooner if necessary.   Murray Hodgkins, NP 08/24/2017, 11:34 AM

## 2017-08-25 ENCOUNTER — Other Ambulatory Visit: Payer: Self-pay | Admitting: Family Medicine

## 2017-08-25 DIAGNOSIS — M545 Low back pain, unspecified: Secondary | ICD-10-CM

## 2017-08-25 NOTE — Telephone Encounter (Signed)
Patient requesting refill of Tizanidine to [facility]. 

## 2017-08-28 DIAGNOSIS — M5137 Other intervertebral disc degeneration, lumbosacral region: Secondary | ICD-10-CM | POA: Diagnosis not present

## 2017-08-28 DIAGNOSIS — G4733 Obstructive sleep apnea (adult) (pediatric): Secondary | ICD-10-CM | POA: Diagnosis not present

## 2017-09-08 DIAGNOSIS — M4802 Spinal stenosis, cervical region: Secondary | ICD-10-CM | POA: Insufficient documentation

## 2017-09-08 DIAGNOSIS — I6529 Occlusion and stenosis of unspecified carotid artery: Secondary | ICD-10-CM | POA: Insufficient documentation

## 2017-09-09 ENCOUNTER — Other Ambulatory Visit: Payer: Self-pay | Admitting: Neurology

## 2017-09-09 DIAGNOSIS — M4802 Spinal stenosis, cervical region: Secondary | ICD-10-CM

## 2017-09-10 ENCOUNTER — Other Ambulatory Visit: Payer: Self-pay | Admitting: Neurology

## 2017-09-10 DIAGNOSIS — R413 Other amnesia: Secondary | ICD-10-CM

## 2017-09-10 DIAGNOSIS — M4802 Spinal stenosis, cervical region: Secondary | ICD-10-CM

## 2017-09-17 ENCOUNTER — Ambulatory Visit
Admission: RE | Admit: 2017-09-17 | Discharge: 2017-09-17 | Disposition: A | Discharge status: Home / Self Care | Payer: Medicaid Other | Source: Ambulatory Visit | Attending: Neurology | Admitting: Neurology

## 2017-09-17 DIAGNOSIS — R413 Other amnesia: Secondary | ICD-10-CM

## 2017-09-17 DIAGNOSIS — M4802 Spinal stenosis, cervical region: Secondary | ICD-10-CM

## 2017-09-23 ENCOUNTER — Ambulatory Visit
Admission: RE | Admit: 2017-09-23 | Discharge: 2017-09-23 | Disposition: A | Discharge status: Home / Self Care | Payer: Medicaid Other | Source: Ambulatory Visit | Attending: Neurology | Admitting: Neurology

## 2017-09-23 DIAGNOSIS — M50321 Other cervical disc degeneration at C4-C5 level: Secondary | ICD-10-CM | POA: Insufficient documentation

## 2017-09-23 DIAGNOSIS — M4802 Spinal stenosis, cervical region: Secondary | ICD-10-CM | POA: Insufficient documentation

## 2017-09-23 DIAGNOSIS — R413 Other amnesia: Secondary | ICD-10-CM | POA: Diagnosis not present

## 2017-09-25 ENCOUNTER — Other Ambulatory Visit: Payer: Self-pay | Admitting: Gastroenterology

## 2017-09-25 ENCOUNTER — Other Ambulatory Visit: Payer: Self-pay | Admitting: Family Medicine

## 2017-09-25 DIAGNOSIS — M545 Low back pain, unspecified: Secondary | ICD-10-CM

## 2017-09-27 DIAGNOSIS — G4733 Obstructive sleep apnea (adult) (pediatric): Secondary | ICD-10-CM | POA: Diagnosis not present

## 2017-09-27 DIAGNOSIS — M5137 Other intervertebral disc degeneration, lumbosacral region: Secondary | ICD-10-CM | POA: Diagnosis not present

## 2017-09-28 ENCOUNTER — Ambulatory Visit: Payer: Medicaid Other | Admitting: Cardiovascular Disease

## 2017-10-01 ENCOUNTER — Encounter: Payer: Self-pay | Admitting: Speech Pathology

## 2017-10-01 ENCOUNTER — Ambulatory Visit: Payer: Medicaid Other | Attending: Neurology | Admitting: Speech Pathology

## 2017-10-01 DIAGNOSIS — R41841 Cognitive communication deficit: Secondary | ICD-10-CM | POA: Diagnosis present

## 2017-10-01 NOTE — Therapy (Signed)
[place] MAIN [facility] SERVICES [address] [place], [place], [address] Phone: [phone]   Fax:  [phone]  Speech Language Pathology Evaluation  Patient Details  Name: [name] [name] MRN: [id number] Date of Birth: 1961-01-12 Referring Provider: Dr. Gurney Maxin  Encounter Date: 10/01/2017      End of Session - 10/01/17 1447    Visit Number 1   Number of Visits 10   Date for SLP Re-Evaluation 12/10/17   SLP Start Time 1300   SLP Stop Time  1350   SLP Time Calculation (min) 50 min   Activity Tolerance Patient tolerated treatment well      Past Medical History:  Diagnosis Date  . Allergy   . Angina, class III ([place])   . Chronic constipation   . Degeneration of intervertebral disc of cervical region   . Diabetes mellitus without complication ([place])   . Diastolic dysfunction    a. 06/2017 Echo: EF 60-65%, no rwma, Gr1 DD.  Marland Kitchen Dyspnea   . GERD (gastroesophageal reflux disease)   . Hernia 1991   02/10/2012-RIH repair  . Hyperlipidemia   . Hypertension 2008  . Inguinal hernia without mention of obstruction or gangrene, recurrent unilateral or unspecified 2012  . Nerve root pain   . Neuropathy   . Non-obstructive CAD (coronary artery disease)    a. 05/30/2015 cath: LM nl, mLAD 50% (FFR 0.83), LCx minor irregs, RCA minor irregs, EF 55-65%-->Med Rx; b. 03/2016 Cath: LM nl LAD 35m, D1/2/3 min irregs, LCX min irregs, OM1/2/3 nl, RCA min irregs, RPDA/RPAV/RPL1/RPL2 nl-->Med Rx.  . Obesity, unspecified 2012  . Personal history of tobacco use, presenting hazards to health 2012  . Polycythemia   . Sleep apnea    a. On CPAP;  b. 06/2017 Echo: no PAH.  Marland Kitchen Umbilical hernia without mention of obstruction or gangrene 2012  . Vitamin D deficiency     Past Surgical History:  Procedure Laterality Date  . BACK SURGERY  12/2008   X2-LUMBAR  . CARDIAC CATHETERIZATION N/A 05/30/2015   Procedure: Left Heart Cath;  Surgeon: Wellington Hampshire, MD;   Location: [place] [facility];  Service: Cardiovascular;  Laterality: N/A;  . CARDIAC CATHETERIZATION N/A 04/16/2016   Procedure: Left Heart Cath and Coronary Angiography;  Surgeon: Wellington Hampshire, MD;  Location: [place] [facility];  Service: Cardiovascular;  Laterality: N/A;  . COLONOSCOPY  2014   Dr. [name]  . COLONOSCOPY WITH PROPOFOL N/A 12/25/2016   Procedure: COLONOSCOPY WITH PROPOFOL;  Surgeon: Jonathon Bellows, MD;  Location: [facility];  Service: Endoscopy;  Laterality: N/A;  . COLONOSCOPY WITH PROPOFOL N/A 01/29/2017   Procedure: COLONOSCOPY WITH PROPOFOL;  Surgeon: Jonathon Bellows, MD;  Location: [facility];  Service: Endoscopy;  Laterality: N/A;  . EVALUATION UNDER ANESTHESIA WITH HEMORRHOIDECTOMY N/A 02/24/2017   Procedure: EXAM UNDER ANESTHESIA WITH POSSIBLE EXCISION OF INTERNAL HEMORRHOIDS;  Surgeon: Olean Ree, MD;  Location: [facility] ORS;  Service: General;  Laterality: N/A;  . FISSURECTOMY  02/24/2017   Procedure: FISSURECTOMY;  Surgeon: Olean Ree, MD;  Location: [facility] ORS;  Service: General;;  . [place]  . INGUINAL HERNIA REPAIR Right 2012  . INGUINAL HERNIA REPAIR Right 2014    There were no vitals filed for this visit.      Subjective Assessment - 10/01/17 1348    Subjective Pt was pleasant and agreeable to evaluation.    Currently in Pain? No/denies  SLP Evaluation [facility] - 10/01/17 1348      SLP Visit Information   SLP Received On 10/01/17   Referring Provider Dr. Gurney Maxin   Onset Date 9 months ago   Medical Diagnosis  Memory Loss or Impairment     Subjective   Subjective Pt was pleasant and coooperative. Agreeable to evaluation.   Patient/Family Stated Goal To help memory     General Information   HPI Pt is a 56 y/o male who is under the care of Dr. Melrose Nakayama for memory loss/impairment. Per chart review and pt interview pt has a h/o concussions, injured playing football in 1981 with resultant injury to vertebrae, as well as pt  reports recent falls. Pt has headaches daily and recent history of memory problems including losing objects, repeating information (stories) and getting lost while driving.    Mobility Status Ambulatory     Prior Functional Status   Cognitive/Linguistic Baseline Information not available   Type of Home Other(Comment)  Condo    Lives With Significant other   Vocation On disability     Cognition   Overall Cognitive Status Impaired/Different from baseline   Area of Impairment Memory   Memory Decreased short-term memory   Attention Focused   Focused Attention Appears intact   Memory Impaired   Memory Impairment Decreased recall of new information;Decreased short term memory   Awareness Appears intact   Problem Solving Appears intact   Executive Function --  Appears intact   Behaviors --  No behaviors observed     Auditory Comprehension   Overall Auditory Comprehension Appears within functional limits for tasks assessed     Visual Recognition/Discrimination   Discrimination Within Function Limits     Reading Comprehension   Reading Status Not tested     Expression   Primary Mode of Expression Verbal     Verbal Expression   Overall Verbal Expression Appears within functional limits for tasks assessed     Written Expression   Dominant Hand Right   Written Expression Not tested     Oral Motor/Sensory Function   Overall Oral Motor/Sensory Function Appears within functional limits for tasks assessed     Motor Speech   Overall Motor Speech Appears within functional limits for tasks assessed     Standardized Assessments   Standardized Assessments  Cognitive Linguistic Quick Test     Cognitive Linguistic Quick Test (Ages 18-69)   Attention WNL   Memory Mild   Executive Function WNL   Language WNL   Visuospatial Skills WNL   Severity Rating Total 19   Composite Severity Rating 15.8         Cognitive Linguistic Quick Test Tasks Personal Facts   8/8 Symbol  Cancellation  11/12 Confrontation Naming  10/10 Clock Drawing   11/13 Story Retelling   4/10 Symbol Trails   10/10 Generative Naming  7/9  Design Memory   6/6 Mazes    8/8 Design Generation  6/13 Cognitive Domain Score  Range  Attention   187 (WNL 215-180) Memory   147 (Mild 154-141) Executive Functions  31 (WNL 40-24) Language   29 (WNL 37-29) Visuospatial Skills  96 (WNL 105-82) Composite Severity Rating - 3.8 (WNL 4.0-3.5)                 SLP Education - 10/01/17 1349    Education provided Yes   Education Details Results of evaluation; treatment strategies   Person(s) Educated Patient   Methods Explanation;Demonstration   Comprehension Verbalized  understanding            SLP Long Term Goals - 10/01/17 1454      SLP LONG TERM GOAL #1   Title Pt will demonstrate functional use of external memory aids with 80% acc within structured setting.   Baseline 0%   Time 10   Period Weeks   Status New     SLP LONG TERM GOAL #2   Title Pt will demonstrate functional cognitive communication skills for independent completion of personal responsibilities.   Baseline 0%   Time 10   Period Weeks   Status New     SLP LONG TERM GOAL #3   Title Pt will complete memory strategy activites with 80% acc.    Baseline 0%   Time 10   Period Weeks   Status New          Plan - 10/01/17 1448    Clinical Impression Statement This 56 y/o male presents w/mild cognitive communication deficits in the area of memory/recall. Pt assessed with the Cognitive Linguistic Quick Test and exhibited deficits in the areas of Story Retelling and Advertising account planner. Pt with Memory Severity Rating of Mild. Subjectively, pt reports that he is having difficulty with ADL's d/t memory deficits. Pt reports that he is forgetting where he leaves objects, re-stating information/stories, as well as becoming lost when driving. Pt is recommended for skilled ST intervention to address memory deficits with  treatment focus on compensatory strategies to aid pt in independent completion of ADL's.    Speech Therapy Frequency 1x /week   Duration Other (comment)  8 weeks   Treatment/Interventions SLP instruction and feedback;Compensatory strategies;Patient/family education;Compensatory techniques;Other (comment)  Cognitive communication    Potential to Achieve Goals Good   Potential Considerations Ability to learn/carryover information;Family/community support;Cooperation/participation level   Consulted and Agree with Plan of Care Patient      Patient will benefit from skilled therapeutic intervention in order to improve the following deficits and impairments:   Cognitive communication deficit - Plan: SLP PLAN OF CARE CERT/RE-CERT    Problem List Patient Active Problem List   Diagnosis Date Noted  . B12 deficiency 06/28/2017  . Headache disorder 02/15/2017  . Memory loss or impairment 02/15/2017  . Rectal bleeding 02/11/2017  . Coronary artery disease involving native coronary artery of native heart   . Radiculitis of left cervical region 03/05/2016  . Diabetic neuropathy associated with type 2 diabetes mellitus ([place]) 12/05/2015  . Patellar subluxation 06/05/2015  . Allergic rhinitis, seasonal 06/05/2015  . Chronic constipation 06/05/2015  . Chronic lower back pain 06/05/2015  . Morbid obesity due to excess calories ([place]) 06/05/2015  . Vitamin D deficiency 06/05/2015  . Central sleep apnea 06/05/2015  . 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 Nerve root pain 06/05/2015  . Acquired polycythemia 06/05/2015  . Anterior knee pain 06/05/2015  . Dysmetabolic syndrome 02/72/5366  . Failure of erection 06/05/2015  . Gastro-esophageal reflux disease without esophagitis 06/05/2015  . Neuropathy 06/05/2015  . CAD (coronary artery disease)   . Angina, class III ([place]) 05/23/2015  . Essential hypertension 05/23/2015  . Hyperlipidemia 05/23/2015  . Inguinal hernia without mention of obstruction or  gangrene, recurrent unilateral or unspecified 03/24/2013   Charlean Sanfilippo, [place], CCC-SLP  Speech-Language Pathologist   [place] 10/01/2017, 3:03 PM  [place] [facility] [address] [place], [place], [address] Phone: [phone]   Fax:  [phone]  Name: [name] [name] MRN: [id number] Date of Birth: [birth year]

## 2017-10-14 ENCOUNTER — Ambulatory Visit (INDEPENDENT_AMBULATORY_CARE_PROVIDER_SITE_OTHER): Payer: Medicaid Other | Admitting: Cardiovascular Disease

## 2017-10-14 ENCOUNTER — Encounter: Payer: Self-pay | Admitting: Cardiovascular Disease

## 2017-10-14 VITALS — BP 160/100 | HR 69 | Ht 71.0 in | Wt 262.0 lb

## 2017-10-14 DIAGNOSIS — I25118 Atherosclerotic heart disease of native coronary artery with other forms of angina pectoris: Secondary | ICD-10-CM | POA: Diagnosis not present

## 2017-10-14 DIAGNOSIS — I10 Essential (primary) hypertension: Secondary | ICD-10-CM

## 2017-10-14 DIAGNOSIS — E782 Mixed hyperlipidemia: Secondary | ICD-10-CM

## 2017-10-14 NOTE — Patient Instructions (Signed)
Medication Instructions: Continue same medications.   Labwork: None.   Procedures/Testing: None.   Follow-Up: 6 months with Dr. Arida.   Any Additional Special Instructions Will Be Listed Below (If Applicable).     If you need a refill on your cardiac medications before your next appointment, please call your pharmacy.   

## 2017-10-14 NOTE — Progress Notes (Signed)
Cardiology Office Note   Date:  10/14/2017   ID:  IRVAN [name], DOB 1961/02/25, MRN [id number]  PCP:  [name] Sizer, MD  Cardiologist:   [name] Sacramento, MD   Chief Complaint  Patient presents with  . other    4-6 week follow up. Patient c/o chest pain and SOB. Patient states he has passed out 3 times since his last visit. Meds reviewed verbally with patient.       History of Present Illness: [name] [name] is a 56 y.o. male who presents for a follow-up visit regarding mild -moderate one-vessel coronary artery disease with stable angina and suspected endothelial dysfunction. He has known history of type 2 diabetes, hypertension, hyperlipidemia and obesity. He is a previous smoker. Cardiac catheterization in June 2016 showed moderate mid LAD stenosis (50%) with an FFR ratio of 0.83 . LV systolic function with normal with normal left ventricular end-diastolic pressure. He had worsening angina in April 2017. Repeat cardiac catheterization showed improvement in mid LAD stenosis to 30% and no evidence of obstructive coronary artery disease.  He does have sleep apnea on CPAP .  Imdur was added during last visit with subsequent improvement in chest pain. He has been compliant with his medications. However, he has not been following low-sodium diet and has been consuming [place] food almost on a daily basis recently. He reports vague episodes of loss of consciousness mostly at rest but he cannot give specific details. Looks like he was getting dizzy and nauseous and could have been a problem related to inner ear.      Past Medical History:  Diagnosis Date  . Allergy   . Angina, class III ([place])   . Chronic constipation   . Degeneration of intervertebral disc of cervical region   . Diabetes mellitus without complication ([place])   . Diastolic dysfunction    a. 06/2017 Echo: EF 60-65%, no rwma, Gr1 DD.  Marland Kitchen Dyspnea   . GERD (gastroesophageal reflux disease)   . Hernia 1991   02/10/2012-RIH repair  . Hyperlipidemia   . Hypertension 2008  . Inguinal hernia without mention of obstruction or gangrene, recurrent unilateral or unspecified 2012  . Nerve root pain   . Neuropathy   . Non-obstructive CAD (coronary artery disease)    a. 05/30/2015 cath: LM nl, mLAD 50% (FFR 0.83), LCx minor irregs, RCA minor irregs, EF 55-65%-->Med Rx; b. 03/2016 Cath: LM nl LAD 67m D1/2/3 min irregs, LCX min irregs, OM1/2/3 nl, RCA min irregs, RPDA/RPAV/RPL1/RPL2 nl-->Med Rx.  . Obesity, unspecified 2012  . Personal history of tobacco use, presenting hazards to health 2012  . Polycythemia   . Sleep apnea    a. On CPAP;  b. 06/2017 Echo: no PAH.  .Marland KitchenUmbilical hernia without mention of obstruction or gangrene 2012  . Vitamin D deficiency     Past Surgical History:  Procedure Laterality Date  . BACK SURGERY  12/2008   X2-LUMBAR  . CARDIAC CATHETERIZATION N/A 05/30/2015   Procedure: Left Heart Cath;  Surgeon: MWellington Hampshire MD;  [facility];  Service: Cardiovascular;  Laterality: N/A;  . CARDIAC CATHETERIZATION N/A 04/16/2016   Procedure: Left Heart Cath and Coronary Angiography;  Surgeon: MWellington Hampshire MD;  [facility];  Service: Cardiovascular;  Laterality: N/A;  . COLONOSCOPY  2014   Dr. SJamal Collin . COLONOSCOPY WITH PROPOFOL N/A 12/25/2016   Procedure: COLONOSCOPY WITH PROPOFOL;  Surgeon: KJonathon Bellows MD;  Location: [facility];  Service:  Endoscopy;  Laterality: N/A;  . COLONOSCOPY WITH PROPOFOL N/A 01/29/2017   Procedure: COLONOSCOPY WITH PROPOFOL;  Surgeon: Jonathon Bellows, MD;  Location: [facility];  Service: Endoscopy;  Laterality: N/A;  . EVALUATION UNDER ANESTHESIA WITH HEMORRHOIDECTOMY N/A 02/24/2017   Procedure: EXAM UNDER ANESTHESIA WITH POSSIBLE EXCISION OF INTERNAL HEMORRHOIDS;  Surgeon: Olean Ree, MD;  Location: [facility];  Service: General;  Laterality: N/A;  . FISSURECTOMY  02/24/2017   Procedure: FISSURECTOMY;  Surgeon: Olean Ree, MD;  Location: [facility];  Service: General;;  . [place]  . INGUINAL HERNIA REPAIR Right 2012  . INGUINAL HERNIA REPAIR Right 2014     Current Outpatient Prescriptions  Medication Sig Dispense Refill  . ACCU-CHEK SMARTVIEW test strip   0  . aspirin 81 MG tablet Take 81 mg by mouth daily.    Marland Kitchen atorvastatin (LIPITOR) 80 MG tablet Take 1 tablet (80 mg total) by mouth every morning. 30 tablet 5  . Blood Glucose Monitoring Suppl (ACCU-CHEK AVIVA PLUS) w/Device KIT 1 kit by Does not apply route 2 (two) times daily. 1 kit   . carvedilol (COREG) 12.5 MG tablet take 1 tablet by mouth twice a day 60 tablet 3  . cetirizine (ZYRTEC) 10 MG tablet Take 10 mg by mouth daily as needed for allergies.   1  . Cyanocobalamin (B-12) 1000 MCG SUBL Place 1 tablet under the tongue daily. 30 each 0  . diclofenac sodium (VOLTAREN) 1 % GEL Apply 4 g topically 4 (four) times daily. (Patient taking differently: Apply 4 g topically 2 (two) times daily as needed. ) 100 g 2  . dicyclomine (BENTYL) 10 MG capsule take 1 capsule by mouth three times a day before meals 90 capsule 1  . FLECTOR 1.3 % PTCH Place 1 patch onto the skin daily as needed (pain).   0  . fluticasone (FLONASE) 50 MCG/ACT nasal spray Place 2 sprays into the nose daily as needed for allergies or rhinitis.     . Insulin Infusion Pump (ACCU-CHEK COMBO) KIT 1 kit by Does not apply route daily. 1 kit 0  . isosorbide mononitrate (IMDUR) 30 MG 24 hr tablet Take 1 tablet (30 mg total) by mouth daily. 90 tablet 3  . Lancets (ACCU-CHEK SOFT TOUCH) lancets Use as instructed 100 each 12  . losartan (COZAAR) 100 MG tablet Take 1 tablet (100 mg total) by mouth daily. 90 tablet 3  . metFORMIN (GLUCOPHAGE-XR) 750 MG 24 hr tablet take 1 tablet by mouth once daily with BREAKFAST 30 tablet 5  . modafinil (PROVIGIL) 200 MG tablet Take 1 tablet (200 mg total) by mouth daily. 30 tablet 2  . NITROSTAT 0.4 MG SL tablet place 1 tablet under the tongue every 5  minutes for UP TO 3 doses if needed for angina as directed by prescriber 25 tablet 1  . nortriptyline (PAMELOR) 10 MG capsule take 1 capsule by mouth at bedtime for 1 with then INCREASE TO 2 at bedtime  1  . omeprazole (PRILOSEC) 20 MG capsule Take 1 capsule (20 mg total) by mouth every morning. 30 capsule 5  . predniSONE (STERAPRED UNI-PAK 48 TAB) 10 MG (48) TBPK tablet Take as directed taper 42 tablet 0  . RANEXA 1000 MG SR tablet take 1 tablet by mouth twice a day 60 tablet 3  . Selenium Sulf-Pyrithione-Urea (SELENIUM SULFIDE) 2.25 % SHAM Apply 1 application topically daily as needed.   0  . Selenium Sulfide 2.25 % LOTN Apply 5 mLs topically  daily. (Patient taking differently: Apply 5 mLs topically daily as needed (dark spots on skin). ) 180 mL 2  . sildenafil (REVATIO) 20 MG tablet take 1-2 tablets by mouth daily if needed 60 tablet 2  . tiZANidine (ZANAFLEX) 4 MG tablet take 1 tablet by mouth every 6 hours if needed for muscle spasm 30 tablet 0   No current facility-administered medications for this visit.     Allergies:   Gabapentin; Latex; and Penicillins    Social History:  The patient  reports that he quit smoking about 12 years ago. His smoking use included Cigars. He started smoking about 14 years ago. He quit after 2.00 years of use. He has never used smokeless tobacco. He reports that he drinks about 0.6 oz of alcohol per week . He reports that he does not use drugs.   Family History:  The patient's family history includes Heart Problems in his mother; Heart Problems (age of onset: 74) in his father; Mental illness in his brother.    ROS:  Please see the history of present illness.   Otherwise, review of systems are positive for none.   All other systems are reviewed and negative.    PHYSICAL EXAM: VS:  BP (!) 160/100 (BP Location: Left Arm, Patient Position: Sitting, Cuff Size: Large)   Pulse 69   Ht '5\' 11"'  (1.803 m)   Wt 262 lb (118.8 kg)   BMI 36.54 kg/m  , BMI Body mass  index is 36.54 kg/m. GEN: Well nourished, well developed, in no acute distress  HEENT: normal  Neck: no JVD, carotid bruits, or masses Cardiac: RRR; no murmurs, rubs, or gallops,no edema  Respiratory:  clear to auscultation bilaterally, normal work of breathing GI: soft, nontender, nondistended, + BS MS: no deformity or atrophy  Skin: warm and dry, no rash Neuro:  Strength and sensation are intact Psych: euthymic mood, full affect   EKG:  EKG is  ordered today.  EKG shows normal sinus rhythm with possible left atrial enlargement and anterolateral changes suggestive of ischemia. No significant change from before.   Recent Labs: 12/17/2016: ALT 36; Hemoglobin 16.5; Platelets 212 02/22/2017: BUN 16; Creatinine, Ser 1.02; Potassium 3.6; Sodium 140    Lipid Panel    Component Value Date/Time   CHOL 167 05/08/2016 0803   TRIG 149 05/08/2016 0803   HDL 52 05/08/2016 0803   CHOLHDL 3.2 05/08/2016 0803   LDLCALC 85 05/08/2016 0803      Wt Readings from Last 3 Encounters:  10/14/17 262 lb (118.8 kg)  08/24/17 254 lb (115.2 kg)  08/04/17 253 lb 14.4 oz (115.2 kg)        ASSESSMENT AND PLAN:  1.  Coronary artery disease involving native coronary arteries with other forms of angina:  The patient has suspected endothelial dysfunction. Cardiac catheterization twice showed no evidence of obstructive coronary artery disease.   Echocardiogram was unremarkable. His symptoms are controlled with antianginal treatment including indoor, carvedilol and Ranexa.  2. Hyperlipidemia:  Continue treatment with high dose atorvastatin. Most recent LDL was 85.  3. Essential hypertension: Blood pressure is elevated today.  The patient has been consuming large amount of sodium with diet. I discussed with him the importance of low sodium diet.  4. Obesity: I discussed with him the importance of restarting an exercise program and attempted weight loss.  5. Possible syncope. Negative neurologic workup.  I doubt arrhythmia with normal LV systolic function. The history seems to be suggestive of inner ear problem  that has resolved.   Disposition:   FU with me in 6 months  Signed,  [name] Sacramento, MD  10/14/2017 2:46 PM    [place]

## 2017-10-21 ENCOUNTER — Ambulatory Visit: Payer: Medicaid Other | Admitting: Speech Pathology

## 2017-10-21 ENCOUNTER — Ambulatory Visit (INDEPENDENT_AMBULATORY_CARE_PROVIDER_SITE_OTHER): Payer: Medicaid Other

## 2017-10-21 DIAGNOSIS — Z23 Encounter for immunization: Secondary | ICD-10-CM | POA: Diagnosis not present

## 2017-10-21 DIAGNOSIS — R41841 Cognitive communication deficit: Secondary | ICD-10-CM

## 2017-10-21 NOTE — Therapy (Signed)
[place] [facility] SERVICES [address] [place], [place], [address] Phone: [phone]   Fax:  [phone]  Speech Language Pathology Treatment  Patient Details  Name: [name] [name] MRN: [id number] Date of Birth: 01/21/1961 Referring Provider: Dr. Gurney Maxin  Encounter Date: 10/21/2017    Past Medical History:  Diagnosis Date  . Allergy   . Angina, class III ([place])   . Chronic constipation   . Degeneration of intervertebral disc of cervical region   . Diabetes mellitus without complication ([place])   . Diastolic dysfunction    a. 06/2017 Echo: EF 60-65%, no rwma, Gr1 DD.  Marland Kitchen Dyspnea   . GERD (gastroesophageal reflux disease)   . Hernia 1991   02/10/2012-RIH repair  . Hyperlipidemia   . Hypertension 2008  . Inguinal hernia without mention of obstruction or gangrene, recurrent unilateral or unspecified 2012  . Nerve root pain   . Neuropathy   . Non-obstructive CAD (coronary artery disease)    a. 05/30/2015 cath: LM nl, mLAD 50% (FFR 0.83), LCx minor irregs, RCA minor irregs, EF 55-65%-->Med Rx; b. 03/2016 Cath: LM nl LAD 56m, D1/2/3 min irregs, LCX min irregs, OM1/2/3 nl, RCA min irregs, RPDA/RPAV/RPL1/RPL2 nl-->Med Rx.  . Obesity, unspecified 2012  . Personal history of tobacco use, presenting hazards to health 2012  . Polycythemia   . Sleep apnea    a. On CPAP;  b. 06/2017 Echo: no PAH.  Marland Kitchen Umbilical hernia without mention of obstruction or gangrene 2012  . Vitamin D deficiency     Past Surgical History:  Procedure Laterality Date  . BACK SURGERY  12/2008   X2-LUMBAR  . CARDIAC CATHETERIZATION N/A 05/30/2015   Procedure: Left Heart Cath;  Surgeon: Wellington Hampshire, MD;  Location: [facility];  Service: Cardiovascular;  Laterality: N/A;  . CARDIAC CATHETERIZATION N/A 04/16/2016   Procedure: Left Heart Cath and Coronary Angiography;  Surgeon: Wellington Hampshire, MD;  Location: [facility];  Service: Cardiovascular;   Laterality: N/A;  . COLONOSCOPY  2014   Dr. [name]  . COLONOSCOPY WITH PROPOFOL N/A 12/25/2016   Procedure: COLONOSCOPY WITH PROPOFOL;  Surgeon: Jonathon Bellows, MD;  Location: [facility];  Service: Endoscopy;  Laterality: N/A;  . COLONOSCOPY WITH PROPOFOL N/A 01/29/2017   Procedure: COLONOSCOPY WITH PROPOFOL;  Surgeon: Jonathon Bellows, MD;  Location: [facility];  Service: Endoscopy;  Laterality: N/A;  . EVALUATION UNDER ANESTHESIA WITH HEMORRHOIDECTOMY N/A 02/24/2017   Procedure: EXAM UNDER ANESTHESIA WITH POSSIBLE EXCISION OF INTERNAL HEMORRHOIDS;  Surgeon: Olean Ree, MD;  Location: [facility];  Service: General;  Laterality: N/A;  . FISSURECTOMY  02/24/2017   Procedure: FISSURECTOMY;  Surgeon: Olean Ree, MD;  Location: [facility];  Service: General;;  . [place]  . INGUINAL HERNIA REPAIR Right 2012  . INGUINAL HERNIA REPAIR Right 2014    There were no vitals filed for this visit.      Subjective Assessment - 10/21/17 1052    Subjective Patient appreciates learning how to help himself               ADULT SLP TREATMENT - 10/21/17 0001      General Information   Behavior/Cognition Alert;Cooperative;Pleasant mood   HPI Pt is a 56 y/o male who is under the care of Dr. Melrose Nakayama for memory loss/impairment. Per chart review and pt interview pt has a h/o concussions, injured playing football in 1981 with resultant injury to vertebrae, as well as pt reports  recent falls. Pt has headaches daily and recent history of memory problems including losing objects, repeating information (stories) and getting lost while driving.      Treatment Provided   Treatment provided Cognitive-Linquistic     Pain Assessment   Pain Assessment No/denies pain     Cognitive-Linquistic Treatment   Treatment focused on Aphasia   Skilled Treatment PATIENT EDUCATION: Review results and implications of testing.  Review basic problem solving process (1. Identify problem. 2. Brainstorm solutions. 3.  Evaluate solution.).  Review WRAP memory strategies and 7 tips to improve memory handout.  IDENTIFY MEMORY PROBLEMS:  The patient is able to identify specific problems due to faulty memory.  He has begun problem solving most problems, but has not completed the evaluate effectiveness part and thus some solutions do not completely solve the issue.   The patient identified losing his TV remotes.  He has a bank of 6 TVs, each with its own remote.  With question to clarify and further define the problem, the patient is able to state that his kids/grandkids may take the remote elsewhere or he may inadvertently put a remote in his pocket.  The patient had already begun solving the problem, such as each TV and remote is numbered.  We discussed getting a container with 6 slots, numbered, and each remote is replaced once it is used.     Assessment / Recommendations / Plan   Plan Continue with current plan of care     Progression Toward Goals   Progression toward goals Progressing toward goals          SLP Education - 10/21/17 1053    Education provided Yes   Education Details memory strategies, problem solving   Person(s) Educated Patient   Methods Explanation   Comprehension Verbalized understanding            SLP Long Term Goals - 10/01/17 1454      SLP LONG TERM GOAL #1   Title Pt will demonstrate functional use of external memory aids with 80% acc within structured setting.   Baseline 0%   Time 10   Period Weeks   Status New     SLP LONG TERM GOAL #2   Title Pt will demonstrate functional cognitive communication skills for independent completion of personal responsibilities.   Baseline 0%   Time 10   Period Weeks   Status New     SLP LONG TERM GOAL #3   Title Pt will complete memory strategy activites with 80% acc.    Baseline 0%   Time 10   Period Weeks   Status New          Plan - 10/21/17 1053    Clinical Impression Statement The patient is actively participating in  identifying/defining cognitive barriers and problem solving potential strategies.     Speech Therapy Frequency 1x /week   Duration Other (comment)   Treatment/Interventions SLP instruction and feedback;Compensatory strategies;Patient/family education;Compensatory techniques;Other (comment)   Potential to Achieve Goals Good   Potential Considerations Ability to learn/carryover information;Family/community support;Cooperation/participation level   SLP Home Exercise Plan keep list of memory failures   Consulted and Agree with Plan of Care Patient      Patient will benefit from skilled therapeutic intervention in order to improve the following deficits and impairments:   Cognitive communication deficit    Problem List Patient Active Problem List   Diagnosis Date Noted  . B12 deficiency 06/28/2017  . Headache disorder 02/15/2017  . Memory  loss or impairment 02/15/2017  . Rectal bleeding 02/11/2017  . Coronary artery disease involving native coronary artery of native heart   . Radiculitis of left cervical region 03/05/2016  . Diabetic neuropathy associated with type 2 diabetes mellitus ([place]) 12/05/2015  . Patellar subluxation 06/05/2015  . Allergic rhinitis, seasonal 06/05/2015  . Chronic constipation 06/05/2015  . Chronic lower back pain 06/05/2015  . Morbid obesity due to excess calories ([place]) 06/05/2015  . Vitamin D deficiency 06/05/2015  . Central sleep apnea 06/05/2015  . 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 Nerve root pain 06/05/2015  . Acquired polycythemia 06/05/2015  . Anterior knee pain 06/05/2015  . Dysmetabolic syndrome 06/26/1600  . Failure of erection 06/05/2015  . Gastro-esophageal reflux disease without esophagitis 06/05/2015  . Neuropathy 06/05/2015  . CAD (coronary artery disease)   . Angina, class III ([place]) 05/23/2015  . Essential hypertension 05/23/2015  . Hyperlipidemia 05/23/2015  . Inguinal hernia without mention of obstruction or gangrene, recurrent unilateral  or unspecified 03/24/2013   [name] Sea, MS/CCC- SLP  Lou Miner 10/21/2017, 11:03 AM  [place] [facility] SERVICES [address] [place], [place], [address] Phone: [phone]   Fax:  [phone]   Name: [name] [name] MRN: [id number] Date of Birth: [birth year]

## 2017-10-25 ENCOUNTER — Ambulatory Visit: Payer: Medicaid Other | Admitting: Speech Pathology

## 2017-11-01 ENCOUNTER — Ambulatory Visit: Payer: Medicaid Other | Attending: Neurology | Admitting: Speech Pathology

## 2017-11-01 DIAGNOSIS — R41841 Cognitive communication deficit: Secondary | ICD-10-CM | POA: Insufficient documentation

## 2017-11-02 ENCOUNTER — Encounter: Payer: Self-pay | Admitting: Speech Pathology

## 2017-11-02 NOTE — Therapy (Signed)
[place] MAIN [facility] SERVICES [address] [place], [place], [address] Phone: [phone]   Fax:  [phone]  Speech Language Pathology Treatment  Patient Details  Name: [name] [name] MRN: [id number] Date of Birth: 1961-10-24 Referring Provider: Dr. Gurney Maxin   Encounter Date: 11/01/2017  End of Session - 11/02/17 1424    Visit Number  3    Number of Visits  4    Date for SLP Re-Evaluation  12/10/17    SLP Start Time  1100    SLP Stop Time   1155    SLP Time Calculation (min)  55 min    Activity Tolerance  Patient tolerated treatment well       Past Medical History:  Diagnosis Date  . Allergy   . Angina, class III ([place])   . Chronic constipation   . Degeneration of intervertebral disc of cervical region   . Diabetes mellitus without complication ([place])   . Diastolic dysfunction    a. 06/2017 Echo: EF 60-65%, no rwma, Gr1 DD.  Marland Kitchen Dyspnea   . GERD (gastroesophageal reflux disease)   . Hernia 1991   02/10/2012-RIH repair  . Hyperlipidemia   . Hypertension 2008  . Inguinal hernia without mention of obstruction or gangrene, recurrent unilateral or unspecified 2012  . Nerve root pain   . Neuropathy   . Non-obstructive CAD (coronary artery disease)    a. 05/30/2015 cath: LM nl, mLAD 50% (FFR 0.83), LCx minor irregs, RCA minor irregs, EF 55-65%-->Med Rx; b. 03/2016 Cath: LM nl LAD 64m, D1/2/3 min irregs, LCX min irregs, OM1/2/3 nl, RCA min irregs, RPDA/RPAV/RPL1/RPL2 nl-->Med Rx.  . Obesity, unspecified 2012  . Personal history of tobacco use, presenting hazards to health 2012  . Polycythemia   . Sleep apnea    a. On CPAP;  b. 06/2017 Echo: no PAH.  Marland Kitchen Umbilical hernia without mention of obstruction or gangrene 2012  . Vitamin D deficiency     Past Surgical History:  Procedure Laterality Date  . BACK SURGERY  12/2008   X2-LUMBAR  . COLONOSCOPY  2014   Dr. [name]  . HERNIA REPAIR  1991  . INGUINAL HERNIA REPAIR Right 2012  .  INGUINAL HERNIA REPAIR Right 2014    There were no vitals filed for this visit.  Subjective Assessment - 11/02/17 1423    Subjective  Patient appreciates learning how to help himself    Currently in Pain?  No/denies            ADULT SLP TREATMENT - 11/02/17 0001      General Information   Behavior/Cognition  Alert;Cooperative;Pleasant mood    HPI  Pt is a 56 y/o male who is under the care of Dr. Melrose Nakayama for memory loss/impairment. Per chart review and pt interview pt has a h/o concussions, injured playing football in 1981 with resultant injury to vertebrae, as well as pt reports recent falls. Pt has headaches daily and recent history of memory problems including losing objects, repeating information (stories) and getting lost while driving.       Treatment Provided   Treatment provided  Cognitive-Linquistic      Pain Assessment   Pain Assessment  No/denies pain      Cognitive-Linquistic Treatment   Treatment focused on  Aphasia    Skilled Treatment  PATIENT EDUCATION: Review basic problem solving process (1. Identify problem. 2. Brainstorm solutions. 3. Evaluate solution.).  Patient reports that several ideas generated last session have been successful  and easy to incorporate.  Review WRAP memory strategies and 7 tips to improve memory handout.  IDENTIFY MEMORY PROBLEMS:  The patient is able to identify specific problems due to faulty memory.  He has begun problem solving most problems, but has not completed the evaluate effectiveness part and thus some solutions do not completely solve the issue.   The patient identified several specific problems due to distraction.  He participated in problem solving with SLP cues.      Assessment / Recommendations / Plan   Plan  Continue with current plan of care      Progression Toward Goals   Progression toward goals  Progressing toward goals       SLP Education - 11/02/17 1424    Education provided  Yes    Education Details  Generalize  from the successful solutions to newly identified problem.    Person(s) Educated  Patient    Methods  Explanation    Comprehension  Verbalized understanding         SLP Long Term Goals - 10/01/17 1454      SLP LONG TERM GOAL #1   Title  Pt will demonstrate functional use of external memory aids with 80% acc within structured setting.    Baseline  0%    Time  10    Period  Weeks    Status  New      SLP LONG TERM GOAL #2   Title  Pt will demonstrate functional cognitive communication skills for independent completion of personal responsibilities.    Baseline  0%    Time  10    Period  Weeks    Status  New      SLP LONG TERM GOAL #3   Title  Pt will complete memory strategy activites with 80% acc.     Baseline  0%    Time  10    Period  Weeks    Status  New       Plan - 11/02/17 1425    Clinical Impression Statement  The patient is actively participating in identifying/defining cognitive barriers and problem solving potential strategies.      Speech Therapy Frequency  1x /week    Duration  Other (comment)    Treatment/Interventions  SLP instruction and feedback;Compensatory strategies;Patient/family education;Compensatory techniques;Other (comment)    Potential to Achieve Goals  Good    Potential Considerations  Ability to learn/carryover information;Family/community support;Cooperation/participation level    SLP Home Exercise Plan  Implement solutions and evaluate    Consulted and Agree with Plan of Care  Patient       Patient will benefit from skilled therapeutic intervention in order to improve the following deficits and impairments:   Cognitive communication deficit    Problem List Patient Active Problem List   Diagnosis Date Noted  . B12 deficiency 06/28/2017  . Headache disorder 02/15/2017  . Memory loss or impairment 02/15/2017  . Rectal bleeding 02/11/2017  . Coronary artery disease involving native coronary artery of native heart   . Radiculitis of  left cervical region 03/05/2016  . Diabetic neuropathy associated with type 2 diabetes mellitus ([place]) 12/05/2015  . Patellar subluxation 06/05/2015  . Allergic rhinitis, seasonal 06/05/2015  . Chronic constipation 06/05/2015  . Chronic lower back pain 06/05/2015  . Morbid obesity due to excess calories ([place]) 06/05/2015  . Vitamin D deficiency 06/05/2015  . Central sleep apnea 06/05/2015  . 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 Nerve root pain 06/05/2015  . Acquired  polycythemia 06/05/2015  . Anterior knee pain 06/05/2015  . Dysmetabolic syndrome 05/07/210  . Failure of erection 06/05/2015  . Gastro-esophageal reflux disease without esophagitis 06/05/2015  . Neuropathy 06/05/2015  . CAD (coronary artery disease)   . Angina, class III ([place]) 05/23/2015  . Essential hypertension 05/23/2015  . Hyperlipidemia 05/23/2015  . Inguinal hernia without mention of obstruction or gangrene, recurrent unilateral or unspecified 03/24/2013   Leroy Sea, MS/CCC- SLP  Lou Miner 11/02/2017, 2:26 PM  [place] MAIN [facility] SERVICES [address] [place], [place], [address] Phone: [phone]   Fax:  [phone]   Name: [name] [name] MRN: [id number] Date of Birth: September 16, 1961

## 2017-11-08 ENCOUNTER — Ambulatory Visit: Payer: Medicaid Other | Admitting: Speech Pathology

## 2017-11-08 ENCOUNTER — Encounter: Payer: Self-pay | Admitting: Speech Pathology

## 2017-11-08 ENCOUNTER — Other Ambulatory Visit: Payer: Self-pay

## 2017-11-08 DIAGNOSIS — R41841 Cognitive communication deficit: Secondary | ICD-10-CM

## 2017-11-08 NOTE — Therapy (Signed)
[place] MAIN [facility] SERVICES [address] [place], [place], [address] Phone: [phone]   Fax:  [phone]  Speech Language Pathology Treatment/Discharge Summary  Patient Details  Name: [name] [name] MRN: [id number] Date of Birth: [birth year] Referring Provider: Dr. Gurney Maxin   Encounter Date: 11/08/2017  End of Session - 11/08/17 1435    Visit Number  4    Number of Visits  4    Date for SLP Re-Evaluation  12/10/17    SLP Start Time  1400    SLP Stop Time   1436    SLP Time Calculation (min)  36 min    Activity Tolerance  Patient tolerated treatment well       Past Medical History:  Diagnosis Date  . Allergy   . Angina, class III ([place])   . Chronic constipation   . Degeneration of intervertebral disc of cervical region   . Diabetes mellitus without complication ([place])   . Diastolic dysfunction    a. 06/2017 Echo: EF 60-65%, no rwma, Gr1 DD.  Marland Kitchen Dyspnea   . GERD (gastroesophageal reflux disease)   . Hernia 1991   02/10/2012-RIH repair  . Hyperlipidemia   . Hypertension 2008  . Inguinal hernia without mention of obstruction or gangrene, recurrent unilateral or unspecified 2012  . Nerve root pain   . Neuropathy   . Non-obstructive CAD (coronary artery disease)    a. 05/30/2015 cath: LM nl, mLAD 50% (FFR 0.83), LCx minor irregs, RCA minor irregs, EF 55-65%-->Med Rx; b. 03/2016 Cath: LM nl LAD 68m D1/2/3 min irregs, LCX min irregs, OM1/2/3 nl, RCA min irregs, RPDA/RPAV/RPL1/RPL2 nl-->Med Rx.  . Obesity, unspecified 2012  . Personal history of tobacco use, presenting hazards to health 2012  . Polycythemia   . Sleep apnea    a. On CPAP;  b. 06/2017 Echo: no PAH.  .Marland KitchenUmbilical hernia without mention of obstruction or gangrene 2012  . Vitamin D deficiency     Past Surgical History:  Procedure Laterality Date  . BACK SURGERY  12/2008   X2-LUMBAR  . COLONOSCOPY  2014   Dr. SJamal Collin . HERNIA REPAIR  1991  . INGUINAL HERNIA REPAIR  Right 2012  . INGUINAL HERNIA REPAIR Right 2014    There were no vitals filed for this visit.  Subjective Assessment - 11/08/17 1435    Subjective  Patient appreciates learning how to help himself    Currently in Pain?  No/denies            ADULT SLP TREATMENT - 11/08/17 0001      General Information   Behavior/Cognition  Alert;Cooperative;Pleasant mood    HPI  Pt is a 56y/o male who is under the care of Dr. PMelrose Nakayamafor memory loss/impairment. Per chart review and pt interview pt has a h/o concussions, injured playing football in 1981 with resultant injury to vertebrae, as well as pt reports recent falls. Pt has headaches daily and recent history of memory problems including losing objects, repeating information (stories) and getting lost while driving.       Treatment Provided   Treatment provided  Cognitive-Linquistic      Pain Assessment   Pain Assessment  No/denies pain      Cognitive-Linquistic Treatment   Treatment focused on  Aphasia    Skilled Treatment  PATIENT EDUCATION: Review basic problem solving process (1. Identify problem. 2. Brainstorm solutions. 3. Evaluate solution.).  Patient reports that several ideas generated in previous sessions have  been successful and easy to incorporate.  Review WRAP memory strategies and 7 tips to improve memory handout.  IDENTIFY MEMORY PROBLEMS:  The patient is able to identify specific problems due to faulty memory.  He is able to participate in brainstorming solutions and evaluating effectiveness.  The patient reports that he feels confident in his ability to continue to continue to do this for himself.       Assessment / Recommendations / Plan   Plan  Discharge SLP treatment due to (comment);All goals met      Progression Toward Goals   Progression toward goals  Goals met, education completed, patient discharged from [facility] - 11/08/17 1435    Education provided  Yes    Education Details  Generalize from the  successful solutions to newly identified problem.    Person(s) Educated  Patient    Methods  Explanation    Comprehension  Verbalized understanding         SLP Long Term Goals - 11/08/17 1437      SLP LONG TERM GOAL #1   Title  Pt will demonstrate functional use of external memory aids with 80% acc within structured setting.    Status  Achieved      SLP LONG TERM GOAL #2   Title  Pt will demonstrate functional cognitive communication skills for independent completion of personal responsibilities.    Status  Achieved      SLP LONG TERM GOAL #3   Title  Pt will complete memory strategy activites with 80% acc.     Status  Achieved       Plan - 11/08/17 1436    Clinical Impression Statement  The patient has actively participated in identifying/defining cognitive barriers, brainstorm potential strategies, and evaluate solution.  He reports that he is able to share his memory problems with family and friends without feeling embarrassed, which allows him to seek help if he cannot think of a simple, doable strategy.  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  The patient has met his goals and is ready for discharge.    Speech Therapy Frequency  1x /week    Duration  Other (comment)    Treatment/Interventions  SLP instruction and feedback;Compensatory strategies;Patient/family education;Compensatory techniques;Other (comment)    Potential to Achieve Goals  Good    Potential Considerations  Ability to learn/carryover information;Family/community support;Cooperation/participation level    SLP Home Exercise Plan  Implement solutions and evaluate    Consulted and Agree with Plan of Care  Patient       Patient will benefit from skilled therapeutic intervention in order to improve the following deficits and impairments:   Cognitive communication deficit    Problem List Patient Active Problem  List   Diagnosis Date Noted  . B12 deficiency 06/28/2017  . Headache disorder 02/15/2017  . Memory loss or impairment 02/15/2017  . Rectal bleeding 02/11/2017  . Coronary artery disease involving native coronary artery of native heart   . Radiculitis of left cervical region 03/05/2016  . Diabetic neuropathy associated with type 2 diabetes mellitus ([place]) 12/05/2015  . Patellar subluxation 06/05/2015  . Allergic rhinitis, seasonal 06/05/2015  . Chronic constipation 06/05/2015  . Chronic lower back pain 06/05/2015  . Morbid obesity due to excess calories ([place]) 06/05/2015  . Vitamin D deficiency 06/05/2015  . Central sleep apnea 06/05/2015  . 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  Nerve root pain 06/05/2015  . Acquired polycythemia 06/05/2015  . Anterior knee pain 06/05/2015  . Dysmetabolic syndrome 30/11/3798  . Failure of erection 06/05/2015  . Gastro-esophageal reflux disease without esophagitis 06/05/2015  . Neuropathy 06/05/2015  . CAD (coronary artery disease)   . Angina, class III ([place]) 05/23/2015  . Essential hypertension 05/23/2015  . Hyperlipidemia 05/23/2015  . Inguinal hernia without mention of obstruction or gangrene, recurrent unilateral or unspecified 03/24/2013   Leroy Sea, MS/CCC- SLP  Lou Miner 11/08/2017, 2:38 PM  [place] MAIN [facility] SERVICES [address] [place], [place], [address] Phone: [phone]   Fax:  [phone]   Name: JEROMIE GAINOR MRN: [id number] Date of Birth: 1961-05-22

## 2017-11-17 DIAGNOSIS — G3184 Mild cognitive impairment, so stated: Secondary | ICD-10-CM | POA: Insufficient documentation

## 2017-11-17 DIAGNOSIS — M5412 Radiculopathy, cervical region: Secondary | ICD-10-CM | POA: Insufficient documentation

## 2017-12-02 ENCOUNTER — Other Ambulatory Visit: Payer: Self-pay | Admitting: Family Medicine

## 2017-12-02 DIAGNOSIS — G4731 Primary central sleep apnea: Secondary | ICD-10-CM

## 2017-12-02 NOTE — Telephone Encounter (Signed)
Refill request for general medication: Provigil 200 mg  Last office visit: 08/04/2017  Last physical exam: None indicated  No follow up indicated

## 2018-01-03 DIAGNOSIS — M4802 Spinal stenosis, cervical region: Secondary | ICD-10-CM | POA: Diagnosis not present

## 2018-01-03 DIAGNOSIS — M5412 Radiculopathy, cervical region: Secondary | ICD-10-CM | POA: Diagnosis not present

## 2018-01-03 DIAGNOSIS — M5030 Other cervical disc degeneration, unspecified cervical region: Secondary | ICD-10-CM | POA: Diagnosis not present

## 2018-02-22 DIAGNOSIS — R569 Unspecified convulsions: Secondary | ICD-10-CM | POA: Insufficient documentation

## 2018-03-04 ENCOUNTER — Encounter: Payer: Self-pay | Admitting: Emergency Medicine

## 2018-03-04 ENCOUNTER — Emergency Department
Admission: EM | Admit: 2018-03-04 | Discharge: 2018-03-04 | Disposition: A | Discharge status: Home / Self Care | Payer: Medicaid Other | Attending: Emergency Medicine | Admitting: Emergency Medicine

## 2018-03-04 ENCOUNTER — Emergency Department: Payer: Medicaid Other

## 2018-03-04 DIAGNOSIS — Z7982 Long term (current) use of aspirin: Secondary | ICD-10-CM | POA: Insufficient documentation

## 2018-03-04 DIAGNOSIS — I110 Hypertensive heart disease with heart failure: Secondary | ICD-10-CM | POA: Diagnosis not present

## 2018-03-04 DIAGNOSIS — K4091 Unilateral inguinal hernia, without obstruction or gangrene, recurrent: Secondary | ICD-10-CM | POA: Diagnosis not present

## 2018-03-04 DIAGNOSIS — R1084 Generalized abdominal pain: Secondary | ICD-10-CM

## 2018-03-04 DIAGNOSIS — Z794 Long term (current) use of insulin: Secondary | ICD-10-CM | POA: Insufficient documentation

## 2018-03-04 DIAGNOSIS — I2510 Atherosclerotic heart disease of native coronary artery without angina pectoris: Secondary | ICD-10-CM | POA: Diagnosis not present

## 2018-03-04 DIAGNOSIS — I5030 Unspecified diastolic (congestive) heart failure: Secondary | ICD-10-CM | POA: Diagnosis not present

## 2018-03-04 DIAGNOSIS — Z87891 Personal history of nicotine dependence: Secondary | ICD-10-CM | POA: Insufficient documentation

## 2018-03-04 DIAGNOSIS — Z91040 Latex allergy status: Secondary | ICD-10-CM | POA: Diagnosis not present

## 2018-03-04 DIAGNOSIS — Z79899 Other long term (current) drug therapy: Secondary | ICD-10-CM | POA: Diagnosis not present

## 2018-03-04 DIAGNOSIS — E119 Type 2 diabetes mellitus without complications: Secondary | ICD-10-CM | POA: Diagnosis not present

## 2018-03-04 DIAGNOSIS — R1031 Right lower quadrant pain: Secondary | ICD-10-CM | POA: Diagnosis present

## 2018-03-04 LAB — COMPREHENSIVE METABOLIC PANEL
ALBUMIN: 4.3 g/dL (ref 3.5–5.0)
ALT: 22 U/L (ref 17–63)
AST: 24 U/L (ref 15–41)
Alkaline Phosphatase: 76 U/L (ref 38–126)
Anion gap: 11 (ref 5–15)
BUN: 13 mg/dL (ref 6–20)
CHLORIDE: 101 mmol/L (ref 101–111)
CO2: 23 mmol/L (ref 22–32)
CREATININE: 1.01 mg/dL (ref 0.61–1.24)
Calcium: 8.8 mg/dL — ABNORMAL LOW (ref 8.9–10.3)
GFR calc Af Amer: >60 mL/min (ref >60)
GFR calc non Af Amer: >60 mL/min (ref >60)
GLUCOSE: 140 mg/dL — AB (ref 65–99)
Potassium: 3.4 mmol/L — ABNORMAL LOW (ref 3.5–5.1)
SODIUM: 135 mmol/L (ref 135–145)
Total Bilirubin: 0.9 mg/dL (ref 0.3–1.2)
Total Protein: 7.8 g/dL (ref 6.5–8.1)

## 2018-03-04 LAB — URINALYSIS, COMPLETE (UACMP) WITH MICROSCOPIC
Bacteria, UA: NONE SEEN
Bilirubin Urine: NEGATIVE
GLUCOSE, UA: NEGATIVE mg/dL
Hgb urine dipstick: NEGATIVE
Ketones, ur: NEGATIVE mg/dL
Leukocytes, UA: NEGATIVE
Nitrite: NEGATIVE
Protein, ur: 30 mg/dL — AB
RBC / HPF: NONE SEEN RBC/hpf (ref 0–5)
SQUAMOUS EPITHELIAL / LPF: NONE SEEN
Specific Gravity, Urine: 1.009 (ref 1.005–1.030)
pH: 6 (ref 5.0–8.0)

## 2018-03-04 LAB — CBC
HCT: 46.3 % (ref 40.0–52.0)
Hemoglobin: 15.8 g/dL (ref 13.0–18.0)
MCH: 31.8 pg (ref 26.0–34.0)
MCHC: 34.2 g/dL (ref 32.0–36.0)
MCV: 93.1 fL (ref 80.0–100.0)
PLATELETS: 264 10*3/uL (ref 150–440)
RBC: 4.97 MIL/uL (ref 4.40–5.90)
RDW: 13.7 % (ref 11.5–14.5)
WBC: 10.3 10*3/uL (ref 3.8–10.6)

## 2018-03-04 LAB — LIPASE, BLOOD: Lipase: 39 U/L (ref 11–51)

## 2018-03-04 MED ORDER — IOPAMIDOL (ISOVUE-300) INJECTION 61%
30.0000 mL | Freq: Once | INTRAVENOUS | Status: AC | PRN
  Administered 2018-03-04: 30 mL via ORAL

## 2018-03-04 MED ORDER — IOPAMIDOL (ISOVUE-300) INJECTION 61%
100.0000 mL | Freq: Once | INTRAVENOUS | Status: AC | PRN
  Administered 2018-03-04: 100 mL via INTRAVENOUS

## 2018-03-04 NOTE — ED Notes (Signed)
Patient transported to CT 

## 2018-03-04 NOTE — Discharge Instructions (Signed)
Please seek medical attention for any high fevers, chest pain, shortness of breath, change in behavior, persistent vomiting, bloody stool or any other new or concerning symptoms.  

## 2018-03-04 NOTE — ED Provider Notes (Signed)
[facility] Emergency Department Provider Note   ____________________________________________   I have reviewed the triage vital signs and the nursing notes.   HISTORY  Chief Complaint Abdominal Pain   History limited by: Not Limited   HPI [name] [name] is a 57 y.o. male who presents to the emergency department today because of concern for right lower quadrant pain. Pain started 4-5 days ago after the patient was lifting. It is in the right lower quadrant and radiates to the scrotum. Last BM was 2 days ago. The pain is worse with movement and pressure. Denies any associated nausea or vomiting. No fevers. Has history of inguinal hernias on the right side and has had surgery for it.     Per medical record review patient has a history of right sided inguinal hernia repair  Past Medical History:  Diagnosis Date  . Allergy   . Angina, class III ([place])   . Chronic constipation   . Degeneration of intervertebral disc of cervical region   . Diabetes mellitus without complication ([place])   . Diastolic dysfunction    a. 06/2017 Echo: EF 60-65%, no rwma, Gr1 DD.  Marland Kitchen Dyspnea   . GERD (gastroesophageal reflux disease)   . Hernia 1991   02/10/2012-RIH repair  . Hyperlipidemia   . Hypertension 2008  . Inguinal hernia without mention of obstruction or gangrene, recurrent unilateral or unspecified 2012  . Nerve root pain   . Neuropathy   . Non-obstructive CAD (coronary artery disease)    a. 05/30/2015 cath: LM nl, mLAD 50% (FFR 0.83), LCx minor irregs, RCA minor irregs, EF 55-65%-->Med Rx; b. 03/2016 Cath: LM nl LAD 20m D1/2/3 min irregs, LCX min irregs, OM1/2/3 nl, RCA min irregs, RPDA/RPAV/RPL1/RPL2 nl-->Med Rx.  . Obesity, unspecified 2012  . Personal history of tobacco use, presenting hazards to health 2012  . Polycythemia   . Sleep apnea    a. On CPAP;  b. 06/2017 Echo: no PAH.  .Marland KitchenUmbilical hernia without mention of obstruction or gangrene 2012  . Vitamin D  deficiency     Patient Active Problem List   Diagnosis Date Noted  . B12 deficiency 06/28/2017  . Headache disorder 02/15/2017  . Memory loss or impairment 02/15/2017  . Rectal bleeding 02/11/2017  . Coronary artery disease involving native coronary artery of native heart   . Radiculitis of left cervical region 03/05/2016  . Diabetic neuropathy associated with type 2 diabetes mellitus ([place] 12/05/2015  . Patellar subluxation 06/05/2015  . Allergic rhinitis, seasonal 06/05/2015  . Chronic constipation 06/05/2015  . Chronic lower back pain 06/05/2015  . Morbid obesity due to excess calories ([place] 06/05/2015  . Vitamin D deficiency 06/05/2015  . Central sleep apnea 06/05/2015  . 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 Nerve root pain 06/05/2015  . Acquired polycythemia 06/05/2015  . Anterior knee pain 06/05/2015  . Dysmetabolic syndrome 091/63/8466 . Failure of erection 06/05/2015  . Gastro-esophageal reflux disease without esophagitis 06/05/2015  . Neuropathy 06/05/2015  . CAD (coronary artery disease)   . Angina, class III ([place] 05/23/2015  . Essential hypertension 05/23/2015  . Hyperlipidemia 05/23/2015  . Inguinal hernia without mention of obstruction or gangrene, recurrent unilateral or unspecified 03/24/2013    Past Surgical History:  Procedure Laterality Date  . BACK SURGERY  12/2008   X2-LUMBAR  . CARDIAC CATHETERIZATION N/A 05/30/2015   Procedure: Left Heart Cath;  Surgeon: MWellington Hampshire MD;  [facility];  Service: Cardiovascular;  Laterality: N/A;  .  CARDIAC CATHETERIZATION N/A 04/16/2016   Procedure: Left Heart Cath and Coronary Angiography;  Surgeon: Wellington Hampshire, MD;  Location: [place] [facility];  Service: Cardiovascular;  Laterality: N/A;  . COLONOSCOPY  2014   Dr. [name]  . COLONOSCOPY WITH PROPOFOL N/A 12/25/2016   Procedure: COLONOSCOPY WITH PROPOFOL;  Surgeon: Jonathon Bellows, MD;  Location: [facility] ENDOSCOPY;  Service: Endoscopy;  Laterality: N/A;   . COLONOSCOPY WITH PROPOFOL N/A 01/29/2017   Procedure: COLONOSCOPY WITH PROPOFOL;  Surgeon: Jonathon Bellows, MD;  Location: [facility] ENDOSCOPY;  Service: Endoscopy;  Laterality: N/A;  . EVALUATION UNDER ANESTHESIA WITH HEMORRHOIDECTOMY N/A 02/24/2017   Procedure: EXAM UNDER ANESTHESIA WITH POSSIBLE EXCISION OF INTERNAL HEMORRHOIDS;  Surgeon: Olean Ree, MD;  Location: [facility] ORS;  Service: General;  Laterality: N/A;  . FISSURECTOMY  02/24/2017   Procedure: FISSURECTOMY;  Surgeon: Olean Ree, MD;  Location: [facility] ORS;  Service: General;;  . [place]  . INGUINAL HERNIA REPAIR Right 2012  . INGUINAL HERNIA REPAIR Right 2014    Prior to Admission medications   Medication Sig Start Date End Date Taking? Authorizing Provider  ACCU-CHEK SMARTVIEW test strip  05/22/17   [provider]  aspirin 81 MG tablet Take 81 mg by mouth daily.    [provider]  atorvastatin (LIPITOR) 80 MG tablet Take 1 tablet (80 mg total) by mouth every morning. 03/10/17   Steele Sizer, MD  Blood Glucose Monitoring Suppl (ACCU-CHEK AVIVA PLUS) w/Device KIT 1 kit by Does not apply route 2 (two) times daily. 04/12/17   Steele Sizer, MD  carvedilol (COREG) 12.5 MG tablet take 1 tablet by mouth twice a day 07/21/17   Wellington Hampshire, MD  cetirizine (ZYRTEC) 10 MG tablet Take 10 mg by mouth daily as needed for allergies.  07/09/16   [provider]  Cyanocobalamin (B-12) 1000 MCG SUBL Place 1 tablet under the tongue daily. 06/28/17   Steele Sizer, MD  diclofenac sodium (VOLTAREN) 1 % GEL Apply 4 g topically 4 (four) times daily. Patient taking differently: Apply 4 g topically 2 (two) times daily as needed.  11/27/16   Steele Sizer, MD  dicyclomine (BENTYL) 10 MG capsule take 1 capsule by mouth three times a day before meals 09/27/17   Jonathon Bellows, MD  FLECTOR 1.3 % [facility] Place 1 patch onto the skin daily as needed (pain).  12/07/16   [provider]  fluticasone (FLONASE) 50 MCG/ACT  nasal spray Place 2 sprays into the nose daily as needed for allergies or rhinitis.  03/05/15   [provider]  Insulin Infusion Pump (ACCU-CHEK COMBO) KIT 1 kit by Does not apply route daily. 04/07/17   Steele Sizer, MD  isosorbide mononitrate (IMDUR) 30 MG 24 hr tablet Take 1 tablet (30 mg total) by mouth daily. 08/24/17 11/22/17  Theora Gianotti, NP  Lancets (ACCU-CHEK SOFT [facility]) lancets Use as instructed 04/12/17   Steele Sizer, MD  losartan (COZAAR) 100 MG tablet Take 1 tablet (100 mg total) by mouth daily. 07/19/17 10/17/17  Wellington Hampshire, MD  metFORMIN (GLUCOPHAGE-XR) 750 MG 24 hr tablet take 1 tablet by mouth once daily with BREAKFAST 06/28/17   Sowles, Drue Stager, MD  NITROSTAT 0.4 MG SL tablet place 1 tablet under the tongue every 5 minutes for UP TO 3 doses if needed for angina as directed by prescriber 01/15/17   Wellington Hampshire, MD  nortriptyline (PAMELOR) 10 MG capsule take 1 capsule by mouth at bedtime for 1 with then INCREASE  TO 2 at bedtime 02/17/17   [provider]  omeprazole (PRILOSEC) 20 MG capsule Take 1 capsule (20 mg total) by mouth every morning. 03/10/17   Steele Sizer, MD  predniSONE (STERAPRED UNI-PAK 48 TAB) 10 MG (48) TBPK tablet Take as directed taper 08/04/17   Steele Sizer, MD  PROVIGIL 200 MG tablet take 1 tablet by mouth once daily 12/07/17   Steele Sizer, MD  RANEXA 1000 MG SR tablet take 1 tablet by mouth twice a day 07/21/17   Wellington Hampshire, MD  Selenium Sulf-Pyrithione-Urea (SELENIUM SULFIDE) 2.25 % SHAM Apply 1 application topically daily as needed.  11/27/16   [provider]  Selenium Sulfide 2.25 % LOTN Apply 5 mLs topically daily. Patient taking differently: Apply 5 mLs topically daily as needed (dark spots on skin).  11/26/16   Steele Sizer, MD  sildenafil (REVATIO) 20 MG tablet take 1-2 tablets by mouth daily if needed Patient not taking: Reported on 10/14/2017 07/21/17   Steele Sizer, MD  tiZANidine  (ZANAFLEX) 4 MG tablet take 1 tablet by mouth every 6 hours if needed for muscle spasm 09/26/17   Steele Sizer, MD    Allergies Gabapentin; Latex; and Penicillins  Family History  Problem Relation Age of Onset  . Heart Problems Mother   . Heart Problems Father 89       myocardial infarction  . Mental illness Brother     Social History Social History   Tobacco Use  . Smoking status: Former Smoker    Years: 2.00    Types: Cigars    Start date: 12/28/2002    Last attempt to quit: 06/04/2005    Years since quitting: 12.7  . Smokeless tobacco: Never Used  Substance Use Topics  . Alcohol use: Yes    Alcohol/week: 0.6 oz    Types: 1 Glasses of wine per week    Comment: RARE BEER  . Drug use: No    Review of Systems Constitutional: No fever/chills Eyes: No visual changes. ENT: No sore throat. Cardiovascular: Denies chest pain. Respiratory: Denies shortness of breath. Gastrointestinal: Positive for right lower quadrant pain.  Genitourinary: Negative for dysuria. Musculoskeletal: Negative for back pain. Skin: Negative for rash. Neurological: Negative for headaches, focal weakness or numbness.  ____________________________________________   PHYSICAL EXAM:  VITAL SIGNS: ED Triage Vitals  Enc Vitals Group     BP 03/04/18 0747 (!) 168/94     Pulse Rate 03/04/18 0747 69     Resp 03/04/18 0747 18     Temp 03/04/18 0747 98 F (36.7 C)     Temp Source 03/04/18 0747 Oral     SpO2 03/04/18 0747 99 %     Weight 03/04/18 0744 262 lb (118.8 kg)     Height --      Head Circumference --      Peak Flow --      Pain Score 03/04/18 0744 6   Constitutional: Alert and oriented. Well appearing and in no distress. Eyes: Conjunctivae are normal.  ENT   Head: Normocephalic and atraumatic.   Nose: No congestion/rhinnorhea.   Mouth/Throat: Mucous membranes are moist.   Neck: No stridor. Hematological/Lymphatic/Immunilogical: No cervical lymphadenopathy. Cardiovascular:  Normal rate, regular rhythm.  No murmurs, rubs, or gallops.  Respiratory: Normal respiratory effort without tachypnea nor retractions. Breath sounds are clear and equal bilaterally. No wheezes/rales/rhonchi. Gastrointestinal: Soft and tender to palpation in the right lower quadrant. No rebound. No guarding.  Genitourinary: Deferred Musculoskeletal: Normal range of motion in all  extremities. No lower extremity edema. Neurologic:  Normal speech and language. No gross focal neurologic deficits are appreciated.  Skin:  Skin is warm, dry and intact. No rash noted. Psychiatric: Mood and affect are normal. Speech and behavior are normal. Patient exhibits appropriate insight and judgment.  ____________________________________________    LABS (pertinent positives/negatives)  Lipase 39 CBC wnl CMP k 3.4, na 135, cr 1.01, glu 140 UA not consistent with infection   ____________________________________________   EKG  None  ____________________________________________    RADIOLOGY  CT abd/pel Possible small recurrent right inguinal hernia, no signs of incarceration. No other acute findings.  ____________________________________________   PROCEDURES  Procedures  ____________________________________________   INITIAL IMPRESSION / ASSESSMENT AND PLAN / ED COURSE  Pertinent labs & imaging results that were available during my care of the patient were reviewed by me and considered in my medical decision making (see chart for details).  Presented to the emergency department today because of concerns for right lower quadrant abdominal pain.  Concern for possible recurrent also concerns for appendicitis.  Blood work did not show any concerning leukocytosis. CT scan without any acute surgical issues. Discussed incarceration return precautions with the patient. Will give surgery follow up.   ____________________________________________   FINAL CLINICAL IMPRESSION(S) / ED  DIAGNOSES  Final diagnoses:  Generalized abdominal pain  Unilateral recurrent inguinal hernia without obstruction or gangrene     Note: This dictation was prepared with Dragon dictation. Any transcriptional errors that result from this process are unintentional     Nance Pear, MD 03/05/18 1137

## 2018-03-04 NOTE — ED Triage Notes (Signed)
Pt c/o RLQ abdominal pain that radiates into groin and under scrotum. Pt denies urinary symptoms as well as swelling. Pt reports he has HX/o hernias and has had recent surgeries for same.

## 2018-03-04 NOTE — ED Notes (Signed)
Pt c/o right groin/hernia pain for the past 4 days, states he is having testicle pain and is not able to have an erection, states he has pain with it..states he is constipated and takes miralax daily.Marland Kitchen

## 2018-03-07 ENCOUNTER — Encounter: Payer: Self-pay | Admitting: General Surgery

## 2018-03-14 ENCOUNTER — Other Ambulatory Visit: Payer: Self-pay | Admitting: Family Medicine

## 2018-03-14 DIAGNOSIS — E782 Mixed hyperlipidemia: Secondary | ICD-10-CM

## 2018-03-14 NOTE — Telephone Encounter (Signed)
Refill Request for Cholesterol medication. Atorvastatin   Last visit: 08/04/2017   Lab Results  Component Value Date   CHOL 167 05/08/2016   HDL 52 05/08/2016   LDLCALC 85 05/08/2016   TRIG 149 05/08/2016   CHOLHDL 3.2 05/08/2016    Follow up on

## 2018-03-14 NOTE — Telephone Encounter (Signed)
Copied from [place]. Topic: Quick Communication - Rx Refill/Question >> Mar 14, 2018 11:37 AM Waylan Rocher, Lumin L wrote: Medication: metFORMIN (GLUCOPHAGE) 850 MG tablet, ACCU-CHEK SMARTVIEW test strip,  Lancets (ACCU-CHEK SOFT TOUCH) lancets   Has the patient contacted their pharmacy? Yes.    (Agent: If no, request that the patient contact the pharmacy for the refill.)  Preferred Pharmacy (with phone number or street name): [id number] - [name], [place] [address] [place] [address] [place] [address] Phone: [phone] Fax: [phone]  Agent: Please be advised that RX refills may take up to 3 business days. We ask that you follow-up with your pharmacy.

## 2018-03-14 NOTE — Telephone Encounter (Signed)
Refill Request for Cholesterol medication. Atorvastatin 80 mg  Last physical: None indicated  Lab Results  Component Value Date   CHOL 167 05/08/2016   HDL 52 05/08/2016   LDLCALC 85 05/08/2016   TRIG 149 05/08/2016   CHOLHDL 3.2 05/08/2016    Follow up visit None indicated

## 2018-03-14 NOTE — Telephone Encounter (Signed)
Has an appt 03/15/18 with Dr. Ancil Boozer   Lipitor refill request

## 2018-03-15 ENCOUNTER — Encounter: Payer: Self-pay | Admitting: Family Medicine

## 2018-03-15 ENCOUNTER — Ambulatory Visit: Payer: Medicaid Other | Admitting: Family Medicine

## 2018-03-15 VITALS — BP 180/100 | HR 84 | Resp 14 | Ht 71.0 in | Wt 262.9 lb

## 2018-03-15 DIAGNOSIS — R809 Proteinuria, unspecified: Secondary | ICD-10-CM | POA: Diagnosis not present

## 2018-03-15 DIAGNOSIS — E1129 Type 2 diabetes mellitus with other diabetic kidney complication: Secondary | ICD-10-CM | POA: Diagnosis not present

## 2018-03-15 DIAGNOSIS — K4090 Unilateral inguinal hernia, without obstruction or gangrene, not specified as recurrent: Secondary | ICD-10-CM

## 2018-03-15 DIAGNOSIS — R569 Unspecified convulsions: Secondary | ICD-10-CM | POA: Diagnosis not present

## 2018-03-15 DIAGNOSIS — E782 Mixed hyperlipidemia: Secondary | ICD-10-CM

## 2018-03-15 DIAGNOSIS — I10 Essential (primary) hypertension: Secondary | ICD-10-CM

## 2018-03-15 DIAGNOSIS — E1140 Type 2 diabetes mellitus with diabetic neuropathy, unspecified: Secondary | ICD-10-CM

## 2018-03-15 LAB — LIPID PANEL
CHOLESTEROL: 135 mg/dL (ref <200)
HDL: 37 mg/dL — AB (ref >40)
LDL Cholesterol (Calc): 77 mg/dL (calc)
NON-HDL CHOLESTEROL (CALC): 98 mg/dL (ref <130)
Total CHOL/HDL Ratio: 3.6 (calc) (ref <5.0)
Triglycerides: 125 mg/dL (ref <150)

## 2018-03-15 LAB — POCT UA - MICROALBUMIN: MICROALBUMIN (UR) POC: 50 mg/L

## 2018-03-15 LAB — POCT GLYCOSYLATED HEMOGLOBIN (HGB A1C): HEMOGLOBIN A1C: 7

## 2018-03-15 MED ORDER — ACCU-CHEK SOFT TOUCH LANCETS MISC: 12 refills | Status: DC

## 2018-03-15 MED ORDER — METFORMIN HCL 850 MG PO TABS: 850.0000 mg | ORAL_TABLET | Freq: Two times a day (BID) | ORAL | 1 refills | Status: DC

## 2018-03-15 MED ORDER — AMLODIPINE BESYLATE 5 MG PO TABS: 2.5000 mg | ORAL_TABLET | Freq: Every day | ORAL | 0 refills | Status: DC

## 2018-03-15 MED ORDER — ACCU-CHEK SMARTVIEW VI STRP: ORAL_STRIP | 2 refills | Status: DC

## 2018-03-15 MED ORDER — ATORVASTATIN CALCIUM 80 MG PO TABS: 80.0000 mg | ORAL_TABLET | ORAL | 5 refills | Status: DC

## 2018-03-15 NOTE — Progress Notes (Addendum)
Name: [name] [name]   MRN: [id number]    DOB: [birth year]   Date:03/15/2018       Progress Note  Subjective  Chief Complaint  Chief Complaint  Patient presents with  . 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 Taking Metformin and hgbA1C has been well controlled. Diagnosed in 07/2014. He is following a diabetic diet, however stopped exercising because Dr. Melrose Nakayama is worried about his episodes of syncope/seizures Denies polyphagia, polydipsia and polyuria. He is due for eye exam.   HTN: his bp has been out of controlled over the past month, he states started after steroid injection and also with Nortriptyline use at night. We will adjust medication, resume norvasc, follow a DASH diet  Hyperlipidemia: taking Atorvastatin, denies myalgia , recheck labs today   CAD: he has chest pain seldom, but he has SOB with moderate activity - sees cardiologist - Dr. Fletcher Anon - on Ranexa on statin, aspirin and beta-blocker.  Chronic neck pain with intermittent radiculitis: having pain daily, using patches, seeing neurosurgeon at [facility], he was going to the pain clinic, recently had steroid injection that helped symptoms temporarily. Marland Kitchen He states he has paresthesia daily, he is on B12 given by neurologist.   OSA: he wears CPAP every night, but still feels tired during the day if he does not take ProvigilHe has memory loss and seen by Dr. Melrose Nakayama he had MRI brain but I will let Dr. Melrose Nakayama discuss with him.  He also has headaches. He has a new CPAP machine  Inguinal hernia: recurrence , seen at [facility] last week, CT showed recurrence, has follow up with Dr. Fleet Contras, it is causing inguinal pain, no change in bowel movements   Syncope/seizures: recurrence falls and diagnosed with seizure by  clinical diagnosis. He had MRI last year, but no lumbar puncture, advised to have eye exam to check for papilledema, also consider repeat MRI for high pressure hydrocephalus.   Patient Active Problem List   Diagnosis Date Noted  . Seizure ([place]) 02/22/2018  . Mild cognitive impairment 11/17/2017  . Radiculopathy of cervical region 11/17/2017  . Stenosis of carotid artery 09/08/2017  . Cervical stenosis of spine 09/08/2017  . B12 deficiency 06/28/2017  . Headache disorder 02/15/2017  . Memory loss or impairment 02/15/2017  . Rectal bleeding 02/11/2017  . Coronary artery disease involving native coronary artery of native heart   . Radiculitis of left cervical region 03/05/2016  . Diabetic neuropathy associated with type 2 diabetes mellitus ([place]) 12/05/2015  . Patellar subluxation 06/05/2015  . Allergic rhinitis, seasonal 06/05/2015  . Chronic constipation 06/05/2015  . Chronic lower back pain 06/05/2015  . Morbid obesity due to excess calories ([place]) 06/05/2015  . Vitamin D deficiency 06/05/2015  . Central sleep apnea 06/05/2015  . 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 Nerve root pain 06/05/2015  . Acquired polycythemia 06/05/2015  . Anterior knee pain 06/05/2015  . Dysmetabolic syndrome 81/12/7508  . Failure of erection 06/05/2015  . Gastro-esophageal reflux disease without esophagitis 06/05/2015  . Neuropathy 06/05/2015  . CAD (coronary artery disease)   . Angina, class III ([place]) 05/23/2015  . Essential hypertension 05/23/2015  . Hyperlipidemia 05/23/2015  . Inguinal hernia without mention of obstruction or gangrene, recurrent unilateral or  unspecified 03/24/2013    Past Surgical History:  Procedure Laterality Date  . BACK SURGERY  12/2008   X2-LUMBAR  . CARDIAC CATHETERIZATION N/A 05/30/2015   Procedure: Left Heart Cath;  Surgeon: Wellington Hampshire, MD;  Location: [facility];  Service: Cardiovascular;  Laterality: N/A;  . CARDIAC CATHETERIZATION N/A 04/16/2016   Procedure:  Left Heart Cath and Coronary Angiography;  Surgeon: Wellington Hampshire, MD;  Location: [facility];  Service: Cardiovascular;  Laterality: N/A;  . COLONOSCOPY  2014   Dr. [name]  . COLONOSCOPY WITH PROPOFOL N/A 12/25/2016   Procedure: COLONOSCOPY WITH PROPOFOL;  Surgeon: Jonathon Bellows, MD;  Location: [facility];  Service: Endoscopy;  Laterality: N/A;  . COLONOSCOPY WITH PROPOFOL N/A 01/29/2017   Procedure: COLONOSCOPY WITH PROPOFOL;  Surgeon: Jonathon Bellows, MD;  Location: [facility];  Service: Endoscopy;  Laterality: N/A;  . EVALUATION UNDER ANESTHESIA WITH HEMORRHOIDECTOMY N/A 02/24/2017   Procedure: EXAM UNDER ANESTHESIA WITH POSSIBLE EXCISION OF INTERNAL HEMORRHOIDS;  Surgeon: Olean Ree, MD;  Location: [facility];  Service: General;  Laterality: N/A;  . FISSURECTOMY  02/24/2017   Procedure: FISSURECTOMY;  Surgeon: Olean Ree, MD;  Location: [facility];  Service: General;;  . [place]  . INGUINAL HERNIA REPAIR Right 2012  . INGUINAL HERNIA REPAIR Right 2014    Family History  Problem Relation Age of Onset  . Heart Problems Mother   . Heart Problems Father 38       myocardial infarction  . Mental illness Brother     Social History   Socioeconomic History  . Marital status: Single    Spouse name: Not on file  . Number of children: Not on file  . Years of education: Not on file  . Highest education level: Not on file  Social Needs  . Financial resource strain: Not on file  . Food insecurity - worry: Not on file  . Food insecurity - inability: Not on file  . Transportation needs - medical: Not on file  . Transportation needs - non-medical: Not on file  Occupational History  . Not on file  Tobacco Use  . Smoking status: Former Smoker    Years: 2.00    Types: Cigars    Start date: 12/28/2002    Last attempt to quit: 06/04/2005    Years since quitting: 12.7  . Smokeless tobacco: Never Used  Substance and Sexual Activity  . Alcohol use: Yes    Alcohol/week:  0.6 oz    Types: 1 Glasses of wine per week    Comment: RARE BEER  . Drug use: No  . Sexual activity: Yes    Partners: Female  Other Topics Concern  . Not on file  Social History Narrative  . Not on file     Current Outpatient Medications:  .  ACCU-CHEK SMARTVIEW test strip, Check fsbs twice daily E11.9, Disp: 100 each, Rfl: 2 .  aspirin 81 MG tablet, Take 81 mg by mouth daily., Disp: , Rfl:  .  atorvastatin (LIPITOR) 80 MG tablet, Take 1 tablet (80 mg total) by mouth every morning., Disp: 30 tablet, Rfl: 5 .  carvedilol (COREG) 12.5 MG tablet, take 1 tablet by mouth twice a day, Disp: 60 tablet, Rfl: 3 .  cetirizine (ZYRTEC) 10 MG tablet, Take 10 mg by mouth daily as needed for allergies. , Disp: , Rfl: 1 .  Cyanocobalamin (B-12) 1000 MCG SUBL, Place 1 tablet under the tongue daily. (Patient taking differently: Place 1  tablet under the tongue every 30 (thirty) days. ), Disp: 30 each, Rfl: 0 .  diclofenac sodium (VOLTAREN) 1 % GEL, Apply 4 g topically 4 (four) times daily. (Patient taking differently: Apply 4 g topically 2 (two) times daily as needed. ), Disp: 100 g, Rfl: 2 .  dicyclomine (BENTYL) 10 MG capsule, take 1 capsule by mouth three times a day before meals, Disp: 90 capsule, Rfl: 1 .  FLECTOR 1.3 % PTCH, Place 1 patch onto the skin daily as needed (pain). , Disp: , Rfl: 0 .  fluticasone (FLONASE) 50 MCG/ACT nasal spray, Place 2 sprays into the nose daily as needed for allergies or rhinitis. , Disp: , Rfl:  .  Lancets (ACCU-CHEK SOFT TOUCH) lancets, Use as instructed, Disp: 100 each, Rfl: 12 .  metFORMIN (GLUCOPHAGE) 850 MG tablet, Take 1 tablet (850 mg total) by mouth 2 (two) times daily with a meal., Disp: 180 tablet, Rfl: 1 .  NITROSTAT 0.4 MG SL tablet, place 1 tablet under the tongue every 5 minutes for UP TO 3 doses if needed for angina as directed by prescriber, Disp: 25 tablet, Rfl: 1 .  nortriptyline (PAMELOR) 10 MG capsule, take 1 capsule at bedtime daily, Disp: , Rfl:  1 .  omeprazole (PRILOSEC) 20 MG capsule, Take 1 capsule (20 mg total) by mouth every morning., Disp: 30 capsule, Rfl: 5 .  RANEXA 1000 MG SR tablet, take 1 tablet by mouth twice a day, Disp: 60 tablet, Rfl: 3 .  Selenium Sulf-Pyrithione-Urea (SELENIUM SULFIDE) 2.25 % SHAM, Apply 1 application topically daily as needed. , Disp: , Rfl: 0 .  Selenium Sulfide 2.25 % LOTN, Apply 5 mLs topically daily., Disp: 180 mL, Rfl: 2 .  tiZANidine (ZANAFLEX) 4 MG tablet, take 1 tablet by mouth every 6 hours if needed for muscle spasm, Disp: 30 tablet, Rfl: 0 .  amLODipine (NORVASC) 5 MG tablet, Take 0.5-1 tablets (2.5-5 mg total) by mouth daily. For first week take half after that one daily, Disp: 30 tablet, Rfl: 0 .  isosorbide mononitrate (IMDUR) 30 MG 24 hr tablet, Take 1 tablet (30 mg total) by mouth daily., Disp: 90 tablet, Rfl: 3 .  losartan (COZAAR) 100 MG tablet, Take 1 tablet (100 mg total) by mouth daily., Disp: 90 tablet, Rfl: 3  Allergies  Allergen Reactions  . Gabapentin Other (See Comments)    Groggy-Mood Changes  . Latex Rash  . Penicillins Hives and Rash    Has patient had a PCN reaction causing immediate rash, facial/tongue/throat swelling, SOB or lightheadedness with hypotension: Yes Has patient had a PCN reaction causing severe rash involving mucus membranes or skin necrosis: No Has patient had a PCN reaction that required hospitalization No Has patient had a PCN reaction occurring within the last 10 years: No If all of the above answers are "NO", then may proceed with Cephalosporin use.      ROS  Constitutional: Negative for fever , positive for  weight change.  Respiratory: Negative for cough and shortness of breath.   Cardiovascular: Negative for chest pain or palpitations.  Gastrointestinal: Negative for abdominal pain, no bowel changes.  Musculoskeletal: Negative for gait problem or joint swelling.  Skin: Negative for rash.  Neurological: Positive for dizziness, but has  episodes of syncope , positive for headache,   No other specific complaints in a complete review of systems (except as listed in HPI above).  Objective  Vitals:   03/15/18 1458  BP: (!) 180/100  Pulse: 84  Resp:  14  SpO2: 98%  Weight: 262 lb 14.4 oz (119.3 kg)  Height: '5\' 11"'  (1.803 m)    Body mass index is 36.67 kg/m.  Physical Exam  Constitutional: Patient appears well-developed and well-nourished. Obese  No distress.  HEENT: head atraumatic, normocephalic, pupils equal and reactive to light,  neck supple, throat within normal limits Cardiovascular: Normal rate, regular rhythm and normal heart sounds.  No murmur heard. No BLE edema. Pulmonary/Chest: Effort normal and breath sounds normal. No respiratory distress. Abdominal: Soft.  There is no tenderness. Psychiatric: Patient has a normal mood and affect. behavior is normal. Judgment and thought content normal. Neurological: no focal findings, normal cranial nerves, romberg negative   Recent Results (from the past 2160 hour(s))  Urinalysis, Complete w Microscopic     Status: Abnormal   Collection Time: 03/04/18  7:43 AM  Result Value Ref Range   Color, Urine YELLOW (A) YELLOW   APPearance CLEAR (A) CLEAR   Specific Gravity, Urine 1.009 1.005 - 1.030   pH 6.0 5.0 - 8.0   Glucose, UA NEGATIVE NEGATIVE mg/dL   Hgb urine dipstick NEGATIVE NEGATIVE   Bilirubin Urine NEGATIVE NEGATIVE   Ketones, ur NEGATIVE NEGATIVE mg/dL   Protein, ur 30 (A) NEGATIVE mg/dL   Nitrite NEGATIVE NEGATIVE   Leukocytes, UA NEGATIVE NEGATIVE   RBC / HPF NONE SEEN 0 - 5 RBC/hpf   WBC, UA 0-5 0 - 5 WBC/hpf   Bacteria, UA NONE SEEN NONE SEEN   Squamous Epithelial / LPF NONE SEEN NONE SEEN    Comment: Performed at [facility], [address] [place], [place] [address]  Lipase, blood     Status: None   Collection Time: 03/04/18  7:47 AM  Result Value Ref Range   Lipase 39 11 - 51 U/L    Comment: Performed at [facility], [address] [place], [place] [address]  Comprehensive metabolic panel     Status: Abnormal   Collection Time: 03/04/18  7:47 AM  Result Value Ref Range   Sodium 135 135 - 145 mmol/L   Potassium 3.4 (L) 3.5 - 5.1 mmol/L   Chloride 101 101 - 111 mmol/L   CO2 23 22 - 32 mmol/L   Glucose, Bld 140 (H) 65 - 99 mg/dL   BUN 13 6 - 20 mg/dL   Creatinine, Ser 1.01 0.61 - 1.24 mg/dL   Calcium 8.8 (L) 8.9 - 10.3 mg/dL   Total Protein 7.8 6.5 - 8.1 g/dL   Albumin 4.3 3.5 - 5.0 g/dL   AST 24 15 - 41 U/L   ALT 22 17 - 63 U/L   Alkaline Phosphatase 76 38 - 126 U/L   Total Bilirubin 0.9 0.3 - 1.2 mg/dL   GFR calc non Af Amer >60 >60 mL/min   GFR calc Af Amer >60 >60 mL/min    Comment: (NOTE) The eGFR has been calculated using the CKD EPI equation. This calculation has not been validated in all clinical situations. eGFR's persistently <60 mL/min signify possible Chronic Kidney Disease.    Anion gap 11 5 - 15    Comment: Performed at [facility], [address] [place], [place] [address]  CBC     Status: None   Collection Time: 03/04/18  7:47 AM  Result Value Ref Range   WBC 10.3 3.8 - 10.6 K/uL   RBC 4.97 4.40 - 5.90 MIL/uL   Hemoglobin 15.8 13.0 - 18.0 g/dL   HCT 46.3 40.0 - 52.0 %  MCV 93.1 80.0 - 100.0 fL   MCH 31.8 26.0 - 34.0 pg   MCHC 34.2 32.0 - 36.0 g/dL   RDW 13.7 11.5 - 14.5 %   Platelets 264 150 - 440 K/uL    Comment: Performed at [facility], [address] [place], [place] [address]  POCT HgB A1C     Status: Abnormal   Collection Time: 03/15/18  3:48 PM  Result Value Ref Range   Hemoglobin A1C 7.0   POCT UA - Microalbumin     Status: Abnormal   Collection Time: 03/15/18  3:49 PM  Result Value Ref Range   Microalbumin Ur, POC 50 mg/L   Creatinine, POC  mg/dL   Albumin/Creatinine Ratio, Urine, POC      Diabetic Foot Exam: Diabetic Foot Exam - Simple   Simple Foot Form Visual Inspection See comments:  Yes Sensation Testing Intact to touch  and monofilament testing bilaterally:  Yes Pulse Check Comments Thick first toe nails.       PHQ2/9: Depression screen [facility] 2/9 06/28/2017 03/10/2017 11/26/2016 07/10/2016 03/05/2016  Decreased Interest 0 0 0 0 0  Down, Depressed, Hopeless 0 0 0 0 0  PHQ - 2 Score 0 0 0 0 0     Fall Risk: Fall Risk  03/15/2018 06/28/2017 03/10/2017 11/26/2016 07/10/2016  Falls in the past year? Yes No No No No  Number falls in past yr: 2 or more - - - -  Injury with Fall? Yes - - - -  Comment - - - - -  Follow up Falls evaluation completed - - - -      Functional Status Survey: Is the patient deaf or have difficulty hearing?: No Does the patient have difficulty seeing, even when wearing glasses/contacts?: No Does the patient have difficulty concentrating, remembering, or making decisions?: Yes Does the patient have difficulty walking or climbing stairs?: No Does the patient have difficulty dressing or bathing?: No Does the patient have difficulty doing errands alone such as visiting a doctor's office or shopping?: Yes    Assessment & Plan  1. Controlled type 2 diabetes mellitus with microalbuminuria, without long-term current use of insulin (HCC)    POCT HgB A1C - POCT UA - Microalbumin - Lancets (ACCU-CHEK SOFT TOUCH) lancets; Use as instructed  Dispense: 100 each; Refill: 12 - ACCU-CHEK SMARTVIEW test strip; Check fsbs twice daily E11.9  Dispense: 100 each; Refill: 2 - metFORMIN (GLUCOPHAGE) 850 MG tablet; Take 1 tablet (850 mg total) by mouth 2 (two) times daily with a meal.  Dispense: 180 tablet; Refill: 1   2. Morbid obesity ([place])  Discussed with the patient the risk posed by an increased BMI. Discussed importance of portion control, calorie counting and at least 150 minutes of physical activity weekly. Avoid sweet beverages and drink more water. Eat at least 6 servings of fruit and vegetables daily   3. Seizure ([place])  Seeing Dr. Melrose Nakayama - normal EEG, clinical diagnosed   4. Right  inguinal hernia  Return to surgeon, recurrence of inguinal hernia  5. Uncontrolled hypertension  - amLODipine (NORVASC) 5 MG tablet; Take 0.5-1 tablets (2.5-5 mg total) by mouth daily. For first week take half after that one daily  Dispense: 30 tablet; Refill: 0  6. Controlled type 2 diabetes with neuropathy (HCC)  - POCT UA - Microalbumin - Lancets (ACCU-CHEK SOFT TOUCH) lancets; Use as instructed  Dispense: 100 each; Refill: 12 - ACCU-CHEK SMARTVIEW test strip; Check fsbs twice daily E11.9  Dispense: 100  each; Refill: 2 - metFORMIN (GLUCOPHAGE) 850 MG tablet; Take 1 tablet (850 mg total) by mouth 2 (two) times daily with a meal.  Dispense: 180 tablet; Refill: 1  7. Mixed hyperlipidemia  - atorvastatin (LIPITOR) 80 MG tablet; Take 1 tablet (80 mg total) by mouth every morning.  Dispense: 30 tablet; Refill: 5

## 2018-03-15 NOTE — Patient Instructions (Signed)

## 2018-03-18 ENCOUNTER — Telehealth: Payer: Self-pay | Admitting: Cardiovascular Disease

## 2018-03-18 ENCOUNTER — Other Ambulatory Visit: Payer: Self-pay

## 2018-03-18 ENCOUNTER — Other Ambulatory Visit: Payer: Self-pay | Admitting: Family Medicine

## 2018-03-18 DIAGNOSIS — R002 Palpitations: Secondary | ICD-10-CM

## 2018-03-18 DIAGNOSIS — E1129 Type 2 diabetes mellitus with other diabetic kidney complication: Secondary | ICD-10-CM

## 2018-03-18 DIAGNOSIS — E1140 Type 2 diabetes mellitus with diabetic neuropathy, unspecified: Secondary | ICD-10-CM

## 2018-03-18 DIAGNOSIS — R809 Proteinuria, unspecified: Principal | ICD-10-CM

## 2018-03-18 MED ORDER — ACCU-CHEK FASTCLIX LANCETS MISC: 1 refills | Status: DC

## 2018-03-18 MED ORDER — ACCU-CHEK SMARTVIEW VI STRP: ORAL_STRIP | 2 refills | Status: DC

## 2018-03-18 MED ORDER — ACCU-CHEK FASTCLIX LANCET KIT: PACK | 12 refills | Status: DC

## 2018-03-18 NOTE — Telephone Encounter (Signed)
"  Dr. Ancil Boozer,  We can certainly evaluate him for possible arrhythmia given recurrent episodes.   Ivin Booty,  Please order a Zio patch 2-week monitor for syncope and palpitation and follow-up with me in 1 month.   Thanks. "  Zio monitor order placed. Left message on machine for patient to contact the office.

## 2018-03-18 NOTE — Telephone Encounter (Signed)
Copied from [place] [id number]. Topic: Quick Communication - See Telephone Encounter >> Mar 18, 2018  9:11 AM Cleaster Corin, NT wrote: CRM for notification. See Telephone encounter for: 03/18/18.  Pt. Calling stating that refill test strips and lancets was wrong ACCU-CHEK SMARTVIEW test strip [[id number]] and Lancets (ACCU-CHEK SOFT TOUCH) lancets [[id number]] its supposed to be accu-chek fastclix test strips and lancets.   [id number] - [name], [place] - [address] [place] [address] Phone: [phone] Fax: [phone]

## 2018-03-18 NOTE — Telephone Encounter (Signed)
Patient returned call and requests zio monitor placement March 26, 8:30am. Will make 1 month follow up appt when in the office Tuesday.

## 2018-03-22 ENCOUNTER — Ambulatory Visit (INDEPENDENT_AMBULATORY_CARE_PROVIDER_SITE_OTHER): Payer: Medicaid Other

## 2018-03-22 ENCOUNTER — Telehealth: Payer: Self-pay

## 2018-03-22 ENCOUNTER — Other Ambulatory Visit: Payer: Self-pay | Admitting: Cardiovascular Disease

## 2018-03-22 VITALS — BP 150/100 | HR 77

## 2018-03-22 DIAGNOSIS — R002 Palpitations: Secondary | ICD-10-CM

## 2018-03-22 DIAGNOSIS — I10 Essential (primary) hypertension: Secondary | ICD-10-CM

## 2018-03-22 NOTE — Telephone Encounter (Signed)
14-day Zio monitor [id number] placed on patient.  Instructions given. Patient verbalized understanding.

## 2018-03-22 NOTE — Progress Notes (Signed)
Patient is here for a blood pressure check. Patient denies chest pain, palpitations, shortness of breath or visual disturbances. At previous visit blood pressure was 180/100 with a heart rate of 84. Today during nurse visit first check blood pressure was 158/100 with heart rate of 86. After resting for 10 minutes it was 150/100 and heart rate was 77. He does take any blood pressure medications and he started taking his whole 2.5-5 mg dose of Amlodipine today. He was instructed to start with half for one week and increase to whole tablet after that.

## 2018-03-23 NOTE — Telephone Encounter (Signed)
Patient agreeable to f/u with Dr. Fletcher Anon May 3, 3:20pm unless monitor results require a sooner appt.

## 2018-03-29 ENCOUNTER — Ambulatory Visit: Payer: Medicaid Other | Admitting: General Surgery

## 2018-03-29 ENCOUNTER — Encounter: Payer: Self-pay | Admitting: General Surgery

## 2018-03-29 VITALS — BP 150/78 | HR 81 | Resp 16 | Ht 71.0 in | Wt 264.0 lb

## 2018-03-29 DIAGNOSIS — S76211A Strain of adductor muscle, fascia and tendon of right thigh, initial encounter: Secondary | ICD-10-CM

## 2018-03-29 NOTE — Patient Instructions (Addendum)
Return as needed

## 2018-03-29 NOTE — Progress Notes (Signed)
Patient ID: [name] [name], male   DOB: June 18, 1961, 57 y.o.   MRN: [id number]  Chief Complaint  Patient presents with  . Hernia    HPI [name] [name] is a 57 y.o. male.  Here for evaluation of a possible right inguinal hernia referred by the ED. He has had the hernia fixed 3 times in the past. He states that back in February his grand son was jumping out of a chair and he had to catch him that is when he first noticed the area bothering him. He has been having lower right groin pain since February. His ED visit was 03-04-18 because the right groin pain started going down into the scrotum area. He states the right groin is worse when picking up his grand kids and intercourse. He does have to support the area with sneezing and coughing. He is wearing a 14 day heart monitor because he has been passing out. The neurosurgeon did an EEG and showed he was having mild seizures. He has also been getting injections from the pain clinic in his neck for degenerative disc.Weight is up 15 pounds in the last six months.  He does plan on going the final 4 basketballs games and wants to be able to travel.  HPI  Past Medical History:  Diagnosis Date  . Allergy   . Angina, class III ([place])   . Chronic constipation   . Degeneration of intervertebral disc of cervical region   . Diabetes mellitus without complication ([place])   . Diastolic dysfunction    a. 06/2017 Echo: EF 60-65%, no rwma, Gr1 DD.  Marland Kitchen Dyspnea   . GERD (gastroesophageal reflux disease)   . Hernia 1991   02/10/2012-RIH repair  . Hyperlipidemia   . Hypertension 2008  . Inguinal hernia without mention of obstruction or gangrene, recurrent unilateral or unspecified 2012  . Nerve root pain   . Neuropathy   . Non-obstructive CAD (coronary artery disease)    a. 05/30/2015 cath: LM nl, mLAD 50% (FFR 0.83), LCx minor irregs, RCA minor irregs, EF 55-65%-->Med Rx; b. 03/2016 Cath: LM nl LAD 42m D1/2/3 min irregs, LCX min irregs, OM1/2/3 nl, RCA min irregs,  RPDA/RPAV/RPL1/RPL2 nl-->Med Rx.  . Obesity, unspecified 2012  . Personal history of tobacco use, presenting hazards to health 2012  . Polycythemia   . Sleep apnea    a. On CPAP;  b. 06/2017 Echo: no PAH.  .Marland KitchenUmbilical hernia without mention of obstruction or gangrene 2012  . Vitamin D deficiency     Past Surgical History:  Procedure Laterality Date  . BACK SURGERY  12/2008   X2-LUMBAR  . CARDIAC CATHETERIZATION N/A 05/30/2015   Procedure: Left Heart Cath;  Surgeon: MWellington Hampshire MD;  [facility];  Service: Cardiovascular;  Laterality: N/A;  . CARDIAC CATHETERIZATION N/A 04/16/2016   Procedure: Left Heart Cath and Coronary Angiography;  Surgeon: MWellington Hampshire MD;  [facility];  Service: Cardiovascular;  Laterality: N/A;  . COLONOSCOPY  2014   Dr. SJamal Collin . COLONOSCOPY WITH PROPOFOL N/A 12/25/2016   Procedure: COLONOSCOPY WITH PROPOFOL;  Surgeon: KJonathon Bellows MD;  Location: [facility];  Service: Endoscopy;  Laterality: N/A;  . COLONOSCOPY WITH PROPOFOL N/A 01/29/2017   Procedure: COLONOSCOPY WITH PROPOFOL;  Surgeon: KJonathon Bellows MD;  Location: [facility];  Service: Endoscopy;  Laterality: N/A;  . EVALUATION UNDER ANESTHESIA WITH HEMORRHOIDECTOMY N/A 02/24/2017   Procedure: EXAM UNDER ANESTHESIA WITH POSSIBLE EXCISION OF INTERNAL HEMORRHOIDS;  Surgeon: Olean Ree, MD;  Location: [facility];  Service: General;  Laterality: N/A;  . FISSURECTOMY  02/24/2017   Procedure: FISSURECTOMY;  Surgeon: Olean Ree, MD;  Location: [facility];  Service: General;;  . HERNIA REPAIR Right 1991  . INGUINAL HERNIA REPAIR Right 2012   Dr [name]  . INGUINAL HERNIA REPAIR Right 2014   Dr [name]    Family History  Problem Relation Age of Onset  . Heart Problems Mother   . Heart Problems Father 25       myocardial infarction  . Mental illness Brother     Social History Social History   Tobacco Use  . Smoking status: Former Smoker    Years: 2.00     Types: Cigars    Start date: 12/28/2002    Last attempt to quit: 06/04/2005    Years since quitting: 12.8  . Smokeless tobacco: Never Used  Substance Use Topics  . Alcohol use: Yes    Alcohol/week: 0.6 oz    Types: 1 Glasses of wine per week    Comment: RARE [employer]  . Drug use: No    Allergies  Allergen Reactions  . Gabapentin Other (See Comments)    Groggy-Mood Changes  . Latex Rash  . Penicillins Hives and Rash    Has patient had a PCN reaction causing immediate rash, facial/tongue/throat swelling, SOB or lightheadedness with hypotension: Yes Has patient had a PCN reaction causing severe rash involving mucus membranes or skin necrosis: No Has patient had a PCN reaction that required hospitalization No Has patient had a PCN reaction occurring within the last 10 years: No If all of the above answers are "NO", then may proceed with Cephalosporin use.     Current Outpatient Medications  Medication Sig Dispense Refill  . ACCU-CHEK FASTCLIX LANCETS MISC Use as directed. 102 each 1  . ACCU-CHEK SMARTVIEW test strip Check fsbs twice daily E11.9 100 each 2  . amLODipine (NORVASC) 5 MG tablet Take 0.5-1 tablets (2.5-5 mg total) by mouth daily. For first week take half after that one daily 30 tablet 0  . aspirin 81 MG tablet Take 81 mg by mouth daily.    Marland Kitchen atorvastatin (LIPITOR) 80 MG tablet Take 1 tablet (80 mg total) by mouth every morning. 30 tablet 5  . carvedilol (COREG) 12.5 MG tablet take 1 tablet by mouth twice a day 60 tablet 3  . cetirizine (ZYRTEC) 10 MG tablet Take 10 mg by mouth daily as needed for allergies.   1  . Cyanocobalamin (B-12) 1000 MCG SUBL Place 1 tablet under the tongue daily. (Patient taking differently: Place 1 tablet under the tongue every 30 (thirty) days. ) 30 each 0  . diclofenac sodium (VOLTAREN) 1 % GEL Apply 4 g topically 4 (four) times daily. (Patient taking differently: Apply 4 g topically 2 (two) times daily as needed. ) 100 g 2  . dicyclomine (BENTYL) 10  MG capsule take 1 capsule by mouth three times a day before meals 90 capsule 1  . FLECTOR 1.3 % PTCH Place 1 patch onto the skin daily as needed (pain).   0  . fluticasone (FLONASE) 50 MCG/ACT nasal spray Place 2 sprays into the nose daily as needed for allergies or rhinitis.     . Lancets (ACCU-CHEK SOFT TOUCH) lancets Use as instructed 100 each 12  . Lancets Misc. (ACCU-CHEK FASTCLIX LANCET) KIT Use as instructed. 1 kit 12  . metFORMIN (GLUCOPHAGE) 850 MG tablet Take 1 tablet (850 mg total) by  mouth 2 (two) times daily with a meal. 180 tablet 1  . NITROSTAT 0.4 MG SL tablet place 1 tablet under the tongue every 5 minutes for UP TO 3 doses if needed for angina as directed by prescriber 25 tablet 1  . nortriptyline (PAMELOR) 10 MG capsule take 1 capsule at bedtime daily  1  . omeprazole (PRILOSEC) 20 MG capsule Take 1 capsule (20 mg total) by mouth every morning. 30 capsule 5  . RANEXA 1000 MG SR tablet TAKE 1 TABLET BY MOUTH TWICE DAILY 60 tablet 0  . Selenium Sulf-Pyrithione-Urea (SELENIUM SULFIDE) 2.25 % SHAM Apply 1 application topically daily as needed.   0  . Selenium Sulfide 2.25 % LOTN Apply 5 mLs topically daily. 180 mL 2  . tiZANidine (ZANAFLEX) 4 MG tablet take 1 tablet by mouth every 6 hours if needed for muscle spasm 30 tablet 0  . isosorbide mononitrate (IMDUR) 30 MG 24 hr tablet Take 1 tablet (30 mg total) by mouth daily. 90 tablet 3  . losartan (COZAAR) 100 MG tablet Take 1 tablet (100 mg total) by mouth daily. 90 tablet 3   No current facility-administered medications for this visit.     Review of Systems Review of Systems  Constitutional: Negative.   Respiratory: Negative.   Cardiovascular: Negative.   Gastrointestinal: Positive for abdominal pain, constipation and nausea. Negative for vomiting.    Blood pressure (!) 150/78, pulse 81, resp. rate 16, height 5' 11" (1.803 m), weight 264 lb (119.7 kg).  Physical Exam Physical Exam  Constitutional: He is oriented to  person, place, and time. He appears well-developed.  Eyes: Conjunctivae are normal. No scleral icterus.  Neck: Neck supple.  Cardiovascular: Normal rate, regular rhythm and normal heart sounds.  Pulmonary/Chest: Effort normal and breath sounds normal.  Abdominal: Soft. Bowel sounds are normal. There is no tenderness.  Genitourinary:     Lymphadenopathy:    He has no cervical adenopathy.  Neurological: He is alert and oriented to person, place, and time.  Skin: Skin is warm and dry.  Psychiatric: His behavior is normal.    Data Reviewed April 07, 2013 operative report for repair of recurrent right inguinal hernia was reviewed.  Significant scarring was described.  Direct hernia identified.  2-0 PDS used to close the posterior defect.  Pro-grip mesh placed along the floor of the inguinal canal. Prior operative notes not available at this time.  Assessment    Right groin strain without evidence of recurrent herniation.    Plan  Follow up as needed. The patient is aware to call back for any questions or concerns.    HPI, Physical Exam, Assessment and Plan have been scribed under the direction and in the presence of Robert Bellow, MD. Karie Fetch, RN  I have completed the exam and reviewed the above documentation for accuracy and completeness.  I agree with the above.  Haematologist has been used and any errors in dictation or transcription are unintentional.  Hervey Ard, M.D., F.A.C.S.  Forest Gleason  03/30/2018, 4:36 PM

## 2018-03-30 DIAGNOSIS — S76219A Strain of adductor muscle, fascia and tendon of unspecified thigh, initial encounter: Secondary | ICD-10-CM | POA: Insufficient documentation

## 2018-04-05 ENCOUNTER — Encounter: Payer: Self-pay | Admitting: General Surgery

## 2018-04-12 ENCOUNTER — Other Ambulatory Visit: Payer: Self-pay | Admitting: Family Medicine

## 2018-04-12 DIAGNOSIS — I10 Essential (primary) hypertension: Secondary | ICD-10-CM

## 2018-04-12 NOTE — Telephone Encounter (Signed)
He needs bp follow up, sending 30 days only, ask him to come for at least a nurse visit in the next week

## 2018-04-15 ENCOUNTER — Ambulatory Visit: Payer: Medicaid Other | Admitting: Emergency Medicine

## 2018-04-15 VITALS — BP 138/76

## 2018-04-15 DIAGNOSIS — I10 Essential (primary) hypertension: Secondary | ICD-10-CM

## 2018-04-20 ENCOUNTER — Ambulatory Visit: Payer: Medicaid Other | Admitting: Family Medicine

## 2018-04-26 ENCOUNTER — Other Ambulatory Visit: Payer: Self-pay | Admitting: Gastroenterology

## 2018-04-29 ENCOUNTER — Ambulatory Visit (INDEPENDENT_AMBULATORY_CARE_PROVIDER_SITE_OTHER): Payer: Medicaid Other | Admitting: Cardiovascular Disease

## 2018-04-29 ENCOUNTER — Encounter: Payer: Self-pay | Admitting: Cardiovascular Disease

## 2018-04-29 VITALS — BP 138/90 | HR 62 | Ht 71.0 in | Wt 263.5 lb

## 2018-04-29 DIAGNOSIS — I10 Essential (primary) hypertension: Secondary | ICD-10-CM | POA: Diagnosis not present

## 2018-04-29 DIAGNOSIS — E782 Mixed hyperlipidemia: Secondary | ICD-10-CM | POA: Diagnosis not present

## 2018-04-29 DIAGNOSIS — I2510 Atherosclerotic heart disease of native coronary artery without angina pectoris: Secondary | ICD-10-CM | POA: Diagnosis not present

## 2018-04-29 NOTE — Patient Instructions (Signed)
Medication Instructions: Continue same medications.   Labwork: None.   Procedures/Testing: None.   Follow-Up: 6 months with Dr. Savon Bordonaro.   Any Additional Special Instructions Will Be Listed Below (If Applicable).     If you need a refill on your cardiac medications before your next appointment, please call your pharmacy.   

## 2018-04-29 NOTE — Progress Notes (Signed)
Cardiology Office Note   Date:  04/29/2018   ID:  [name] [name], DOB [birth year], MRN [id number]  PCP:  [name] Sizer, MD  Cardiologist:   [name] Sacramento, MD   Chief Complaint  Patient presents with  . OTHER    F/u zio c/o dizziness, elevated BP and sob. Meds reviewed verbally with pt.      History of Present Illness: [name] [name] is a 57 y.o. male who presents for a follow-up visit regarding mild -moderate one-vessel coronary artery disease with stable angina and suspected endothelial dysfunction. He has known history of type 2 diabetes, hypertension, hyperlipidemia and obesity. He is a previous smoker. Cardiac catheterization in June 2016 showed moderate mid LAD stenosis (50%) with an FFR ratio of 0.83 . LV systolic function with normal with normal left ventricular end-diastolic pressure. He had worsening angina in April 2017. Repeat cardiac catheterization showed improvement in mid LAD stenosis to 30% and no evidence of obstructive coronary artery disease.  He does have sleep apnea on CPAP .  He continues to have episodes of loss of consciousness of unclear etiology.  He reports that these episodes are sudden and can happen anytime.  They last for a few minutes and usually preceded by dizziness.  He now knows when they are coming and is able to avoid full loss of consciousness.  He describes orthostatic dizziness although he is not orthostatic by vital signs today.  We did arrange for him to have a 2-week outpatient ZIO monitor which did not show any significant arrhythmia.  He reports that he had dizziness while he was wearing the monitor but not syncopal episode. He is otherwise doing well from a cardiac standpoint with no recent chest pain or worsening dyspnea.    Past Medical History:  Diagnosis Date  . Allergy   . Angina, class III ([place])   . Chronic constipation   . Degeneration of intervertebral disc of cervical region   . Diabetes mellitus without complication ([place])    . Diastolic dysfunction    a. 06/2017 Echo: EF 60-65%, no rwma, Gr1 DD.  Marland Kitchen Dyspnea   . GERD (gastroesophageal reflux disease)   . Hernia 1991   02/10/2012-RIH repair  . Hyperlipidemia   . Hypertension 2008  . Nerve root pain   . Neuropathy   . Non-obstructive CAD (coronary artery disease)    a. 05/30/2015 cath: LM nl, mLAD 50% (FFR 0.83), LCx minor irregs, RCA minor irregs, EF 55-65%-->Med Rx; b. 03/2016 Cath: LM nl LAD 68m D1/2/3 min irregs, LCX min irregs, OM1/2/3 nl, RCA min irregs, RPDA/RPAV/RPL1/RPL2 nl-->Med Rx.  . Obesity, unspecified 2012  . Personal history of tobacco use, presenting hazards to health 2012  . Polycythemia   . Recurrent Right Inguinal Hernia Repair 02/09/2011[name] . Sleep apnea    a. On CPAP;  b. 06/2017 Echo: no PAH.  .Marland KitchenUmbilical hernia without mention of obstruction or gangrene 02/09/2011   [name] . Vitamin D deficiency     Past Surgical History:  Procedure Laterality Date  . BACK SURGERY  12/2008   X2-LUMBAR  . CARDIAC CATHETERIZATION N/A 05/30/2015   Procedure: Left Heart Cath;  Surgeon: [name] Hampshire MD;  [facility];  Service: Cardiovascular;  Laterality: N/A;  . CARDIAC CATHETERIZATION N/A 04/16/2016   Procedure: Left Heart Cath and Coronary Angiography;  Surgeon: [name] Hampshire MD;  [facility];  Service: Cardiovascular;  Laterality: N/A;  . COLONOSCOPY  2014   Dr. Jamal [name]  . COLONOSCOPY WITH PROPOFOL N/A 12/25/2016   Procedure: COLONOSCOPY WITH PROPOFOL;  Surgeon: [name] Bellows, MD;  Location: [facility];  Service: Endoscopy;  Laterality: N/A;  . COLONOSCOPY WITH PROPOFOL N/A 01/29/2017   Procedure: COLONOSCOPY WITH PROPOFOL;  Surgeon: [name] Bellows, MD;  Location: [facility];  Service: Endoscopy;  Laterality: N/A;  . EVALUATION UNDER ANESTHESIA WITH HEMORRHOIDECTOMY N/A 02/24/2017   Procedure: EXAM UNDER ANESTHESIA WITH POSSIBLE EXCISION OF INTERNAL HEMORRHOIDS;  Surgeon:  [name] Ree, MD;  Location: [facility];  Service: General;  Laterality: N/A;  . FISSURECTOMY  02/24/2017   Procedure: FISSURECTOMY;  Surgeon: [name] Ree, MD;  Location: [facility];  Service: General;;  . HERNIA REPAIR Right 1991  . INGUINAL HERNIA REPAIR Right 2012   Dr [name] [name]  . INGUINAL HERNIA REPAIR Right 2014   Dr [name] [name]     Current Outpatient Medications  Medication Sig Dispense Refill  . ACCU-CHEK FASTCLIX LANCETS MISC Use as directed. 102 each 1  . ACCU-CHEK SMARTVIEW test strip Check fsbs twice daily E11.9 100 each 2  . amLODipine (NORVASC) 5 MG tablet Take 1 tablet (5 mg total) by mouth daily. 30 tablet 0  . aspirin 81 MG tablet Take 81 mg by mouth daily.    Marland Kitchen atorvastatin (LIPITOR) 80 MG tablet Take 1 tablet (80 mg total) by mouth every morning. 30 tablet 5  . carvedilol (COREG) 12.5 MG tablet take 1 tablet by mouth twice a day 60 tablet 3  . cetirizine (ZYRTEC) 10 MG tablet Take 10 mg by mouth daily as needed for allergies.   1  . Cyanocobalamin (B-12) 1000 MCG SUBL Place 1 tablet under the tongue daily. (Patient taking differently: Place 1 tablet under the tongue every 30 (thirty) days. ) 30 each 0  . diclofenac sodium (VOLTAREN) 1 % GEL Apply 4 g topically 4 (four) times daily. (Patient taking differently: Apply 4 g topically 2 (two) times daily as needed. ) 100 g 2  . dicyclomine (BENTYL) 10 MG capsule TAKE 1 CAPSULE BY MOUTH THREE TIMES A DAY BEFORE MEALS 90 capsule 0  . FLECTOR 1.3 % PTCH Place 1 patch onto the skin daily as needed (pain).   0  . fluticasone (FLONASE) 50 MCG/ACT nasal spray Place 2 sprays into the nose daily as needed for allergies or rhinitis.     . Lancets (ACCU-CHEK SOFT TOUCH) lancets Use as instructed 100 each 12  . Lancets Misc. (ACCU-CHEK FASTCLIX LANCET) KIT Use as instructed. 1 kit 12  . metFORMIN (GLUCOPHAGE) 850 MG tablet Take 1 tablet (850 mg total) by mouth 2 (two) times daily with a meal. 180 tablet 1  . NITROSTAT 0.4 MG SL tablet place 1  tablet under the tongue every 5 minutes for UP TO 3 doses if needed for angina as directed by prescriber 25 tablet 1  . nortriptyline (PAMELOR) 10 MG capsule take 1 capsule at bedtime daily  1  . omeprazole (PRILOSEC) 20 MG capsule Take 1 capsule (20 mg total) by mouth every morning. 30 capsule 5  . RANEXA 1000 MG SR tablet TAKE 1 TABLET BY MOUTH TWICE DAILY 60 tablet 0  . Selenium Sulf-Pyrithione-Urea (SELENIUM SULFIDE) 2.25 % SHAM Apply 1 application topically daily as needed.   0  . Selenium Sulfide 2.25 % LOTN Apply 5 mLs topically daily. 180 mL 2  . tiZANidine (ZANAFLEX) 4 MG tablet take 1 tablet by mouth every 6 hours if needed for muscle  spasm 30 tablet 0  . isosorbide mononitrate (IMDUR) 30 MG 24 hr tablet Take 1 tablet (30 mg total) by mouth daily. 90 tablet 3  . losartan (COZAAR) 100 MG tablet Take 1 tablet (100 mg total) by mouth daily. 90 tablet 3   No current facility-administered medications for this visit.     Allergies:   Gabapentin; Latex; and Penicillins    Social History:  The patient  reports that he quit smoking about 12 years ago. His smoking use included cigars. He started smoking about 15 years ago. He quit after 2.00 years of use. He has never used smokeless tobacco. He reports that he drinks about 0.6 oz of alcohol per week. He reports that he does not use drugs.   Family History:  The patient's family history includes Heart Problems in his mother; Heart Problems (age of onset: 37) in his father; Mental illness in his brother.    ROS:  Please see the history of present illness.   Otherwise, review of systems are positive for none.   All other systems are reviewed and negative.    PHYSICAL EXAM: VS:  BP 138/90 (BP Location: Right Arm, Patient Position: Sitting, Cuff Size: Large)   Pulse 62   Ht _0  (1.803 m)   Wt 263 lb 8 oz (119.5 kg)   BMI 36.75 kg/m  , BMI Body mass index is 36.75 kg/m. GEN: Well nourished, well developed, in no acute distress  HEENT:  normal  Neck: no JVD, carotid bruits, or masses Cardiac: RRR; no murmurs, rubs, or gallops,no edema  Respiratory:  clear to auscultation bilaterally, normal work of breathing GI: soft, nontender, nondistended, + BS MS: no deformity or atrophy  Skin: warm and dry, no rash Neuro:  Strength and sensation are intact Psych: euthymic mood, full affect   EKG:  EKG is  ordered today.  EKG shows normal sinus rhythm with nonspecific inferolateral T wave changes.  Recent Labs: 03/04/2018: ALT 22; BUN 13; Creatinine, Ser 1.01; Hemoglobin 15.8; Platelets 264; Potassium 3.4; Sodium 135    Lipid Panel    Component Value Date/Time   CHOL 135 03/15/2018 1550   CHOL 167 05/08/2016 0803   TRIG 125 03/15/2018 1550   HDL 37 (L) 03/15/2018 1550   HDL 52 05/08/2016 0803   CHOLHDL 3.6 03/15/2018 1550   LDLCALC 77 03/15/2018 1550      Wt Readings from Last 3 Encounters:  04/29/18 263 lb 8 oz (119.5 kg)  03/29/18 264 lb (119.7 kg)  03/15/18 262 lb 14.4 oz (119.3 kg)        ASSESSMENT AND PLAN:  1.  Coronary artery disease involving native coronary arteries with other forms of angina:  The patient has suspected endothelial dysfunction. Cardiac catheterization twice showed no evidence of obstructive coronary artery disease.    His symptoms are well controlled on antianginal medications.  He reports feeling well and I made no changes in his medications.  2. Hyperlipidemia:  Continue treatment with high dose atorvastatin. Most recent LDL was 77 which is close to target.  3. Essential hypertension: Blood pressure is elevated today.   I did not increase any of his medications given complaints of dizziness.  4.  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  He is getting work-up by neurology for possible seizures.   Disposition:   FU with me in 6 months  Signed,  [name] Sacramento, MD  04/29/2018  3:31 PM      [facility]

## 2018-05-16 ENCOUNTER — Other Ambulatory Visit: Payer: Self-pay | Admitting: Cardiovascular Disease

## 2018-05-16 DIAGNOSIS — I10 Essential (primary) hypertension: Secondary | ICD-10-CM

## 2018-06-08 ENCOUNTER — Encounter: Payer: Self-pay | Admitting: Cardiovascular Disease

## 2018-06-08 NOTE — Telephone Encounter (Signed)
Spoke with patient and he reports that he has appointment with his PCP tomorrow. Advised that he should check with them and if any further questions or needs to please give [place] a call back. He verbalized understanding with no further questions at this time.

## 2018-06-09 ENCOUNTER — Encounter: Payer: Self-pay | Admitting: Family Medicine

## 2018-06-09 ENCOUNTER — Ambulatory Visit: Payer: Medicaid Other | Admitting: Family Medicine

## 2018-06-09 ENCOUNTER — Telehealth: Payer: Self-pay

## 2018-06-09 ENCOUNTER — Ambulatory Visit
Admission: RE | Admit: 2018-06-09 | Discharge: 2018-06-09 | Disposition: A | Discharge status: Home / Self Care | Payer: Medicaid Other | Source: Ambulatory Visit | Attending: Family Medicine | Admitting: Family Medicine

## 2018-06-09 VITALS — BP 178/98 | HR 83 | Resp 16 | Ht 71.0 in | Wt 260.0 lb

## 2018-06-09 DIAGNOSIS — R519 Headache, unspecified: Secondary | ICD-10-CM

## 2018-06-09 DIAGNOSIS — I10 Essential (primary) hypertension: Secondary | ICD-10-CM

## 2018-06-09 DIAGNOSIS — I639 Cerebral infarction, unspecified: Secondary | ICD-10-CM | POA: Diagnosis not present

## 2018-06-09 DIAGNOSIS — I6523 Occlusion and stenosis of bilateral carotid arteries: Secondary | ICD-10-CM | POA: Diagnosis not present

## 2018-06-09 DIAGNOSIS — R51 Headache: Secondary | ICD-10-CM | POA: Insufficient documentation

## 2018-06-09 DIAGNOSIS — Z8673 Personal history of transient ischemic attack (TIA), and cerebral infarction without residual deficits: Secondary | ICD-10-CM

## 2018-06-09 LAB — POCT I-STAT CREATININE: Creatinine, Ser: 1 mg/dL (ref 0.61–1.24)

## 2018-06-09 MED ORDER — HYDROCHLOROTHIAZIDE 12.5 MG PO TABS
12.5000 mg | ORAL_TABLET | Freq: Every day | ORAL | 0 refills | Status: DC
Start: 2018-06-09 — End: 2018-06-21

## 2018-06-09 MED ORDER — IOPAMIDOL (ISOVUE-370) INJECTION 76%
75.0000 mL | Freq: Once | INTRAVENOUS | Status: AC | PRN
  Administered 2018-06-09: 75 mL via INTRAVENOUS

## 2018-06-09 NOTE — Progress Notes (Signed)
Name: [name] [name]   MRN: [id number]    DOB: September 22, 1961   Date:06/09/2018       Progress Note  Subjective  Chief Complaint  Chief Complaint  Patient presents with  . Hypertension    Having headaches, BP has been elevated 185/138-Left arm numbness    HPI  Uncontrolled HTN and severe headache: he states symptoms started suddenly two nights. He woke up yesterday with right eye swollen and tearing also left arm went limp for about 30 minutes, those symptoms improved after he took another bp medication and rested. He states headache is the worse headache ever. He was at neurologist yesterday and bp was 185/138 . No nausea or vomiting, he has noticed some blurred vision from right eye. Headache still intense today   Patient Active Problem List   Diagnosis Date Noted  . Groin strain 03/30/2018  . Seizure ([place]) 02/22/2018  . Mild cognitive impairment 11/17/2017  . Radiculopathy of cervical region 11/17/2017  . Stenosis of carotid artery 09/08/2017  . Cervical stenosis of spine 09/08/2017  . B12 deficiency 06/28/2017  . Headache disorder 02/15/2017  . Memory loss or impairment 02/15/2017  . Rectal bleeding 02/11/2017  . Coronary artery disease involving native coronary artery of native heart   . Radiculitis of left cervical region 03/05/2016  . Diabetic neuropathy associated with type 2 diabetes mellitus ([place]) 12/05/2015  . Patellar subluxation 06/05/2015  . Allergic rhinitis, seasonal 06/05/2015  . Chronic constipation 06/05/2015  . Chronic lower back pain 06/05/2015  . Morbid obesity due to excess calories ([place]) 06/05/2015  . Vitamin D deficiency 06/05/2015  . Central sleep apnea 06/05/2015  . 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 Nerve root pain 06/05/2015  . Acquired polycythemia 06/05/2015  . Anterior knee pain 06/05/2015  . Dysmetabolic syndrome 78/29/5621  . Failure of erection 06/05/2015  . Gastro-esophageal reflux disease without esophagitis 06/05/2015  . Neuropathy 06/05/2015   . CAD (coronary artery disease)   . Angina, class III ([place]) 05/23/2015  . Essential hypertension 05/23/2015  . Hyperlipidemia 05/23/2015  . Inguinal hernia without mention of obstruction or gangrene, recurrent unilateral or unspecified 03/24/2013    Past Surgical History:  Procedure Laterality Date  . BACK SURGERY  12/2008   X2-LUMBAR  . CARDIAC CATHETERIZATION N/A 05/30/2015   Procedure: Left Heart Cath;  Surgeon: Wellington Hampshire, MD;  Location: [facility];  Service: Cardiovascular;  Laterality: N/A;  . CARDIAC CATHETERIZATION N/A 04/16/2016   Procedure: Left Heart Cath and Coronary Angiography;  Surgeon: Wellington Hampshire, MD;  Location: [facility];  Service: Cardiovascular;  Laterality: N/A;  . COLONOSCOPY  2014   Dr. [name]  . COLONOSCOPY WITH PROPOFOL N/A 12/25/2016   Procedure: COLONOSCOPY WITH PROPOFOL;  Surgeon: Jonathon Bellows, MD;  Location: [facility];  Service: Endoscopy;  Laterality: N/A;  . COLONOSCOPY WITH PROPOFOL N/A 01/29/2017   Procedure: COLONOSCOPY WITH PROPOFOL;  Surgeon: Jonathon Bellows, MD;  Location: [facility];  Service: Endoscopy;  Laterality: N/A;  . EVALUATION UNDER ANESTHESIA WITH HEMORRHOIDECTOMY N/A 02/24/2017   Procedure: EXAM UNDER ANESTHESIA WITH POSSIBLE EXCISION OF INTERNAL HEMORRHOIDS;  Surgeon: Olean Ree, MD;  Location: [facility];  Service: General;  Laterality: N/A;  . FISSURECTOMY  02/24/2017   Procedure: FISSURECTOMY;  Surgeon: Olean Ree, MD;  Location: [facility];  Service: General;;  . HERNIA REPAIR Right 1991  . INGUINAL HERNIA REPAIR Right 2012   Dr [name]  . INGUINAL HERNIA REPAIR Right 2014   Dr [name]  Family History  Problem Relation Age of Onset  . Heart Problems Mother   . Heart Problems Father 40       myocardial infarction  . Mental illness Brother     Social History   Socioeconomic History  . Marital status: Single    Spouse name: Not on file  . Number of children: Not on file  . Years of education:  Not on file  . Highest education level: Not on file  Occupational History  . Not on file  Social Needs  . Financial resource strain: Not on file  . Food insecurity:    Worry: Not on file    Inability: Not on file  . Transportation needs:    Medical: Not on file    Non-medical: Not on file  Tobacco Use  . Smoking status: Former Smoker    Years: 2.00    Types: Cigars    Start date: 12/28/2002    Last attempt to quit: 06/04/2005    Years since quitting: 13.0  . Smokeless tobacco: Never Used  Substance and Sexual Activity  . Alcohol use: Yes    Alcohol/week: 0.6 oz    Types: 1 Glasses of wine per week    Comment: [employer]  . Drug use: No  . Sexual activity: Yes    Partners: Female  Lifestyle  . Physical activity:    Days per week: Not on file    Minutes per session: Not on file  . Stress: Not on file  Relationships  . Social connections:    Talks on phone: Not on file    Gets together: Not on file    Attends religious service: Not on file    Active member of club or organization: Not on file    Attends meetings of clubs or organizations: Not on file    Relationship status: Not on file  . Intimate partner violence:    Fear of current or ex partner: Not on file    Emotionally abused: Not on file    Physically abused: Not on file    Forced sexual activity: Not on file  Other Topics Concern  . Not on file  Social History Narrative  . Not on file     Current Outpatient Medications:  .  ACCU-CHEK FASTCLIX LANCETS MISC, Use as directed., Disp: 102 each, Rfl: 1 .  ACCU-CHEK SMARTVIEW test strip, Check fsbs twice daily E11.9, Disp: 100 each, Rfl: 2 .  amLODipine (NORVASC) 5 MG tablet, Take 1 tablet (5 mg total) by mouth daily., Disp: 30 tablet, Rfl: 0 .  aspirin 81 MG tablet, Take 81 mg by mouth daily., Disp: , Rfl:  .  atorvastatin (LIPITOR) 80 MG tablet, Take 1 tablet (80 mg total) by mouth every morning., Disp: 30 tablet, Rfl: 5 .  cetirizine (ZYRTEC) 10 MG tablet, Take  10 mg by mouth daily as needed for allergies. , Disp: , Rfl: 1 .  Cyanocobalamin (B-12) 1000 MCG SUBL, Place 1 tablet under the tongue daily. (Patient taking differently: Place 1 tablet under the tongue every 30 (thirty) days. ), Disp: 30 each, Rfl: 0 .  diclofenac sodium (VOLTAREN) 1 % GEL, Apply 4 g topically 4 (four) times daily. (Patient taking differently: Apply 4 g topically 2 (two) times daily as needed. ), Disp: 100 g, Rfl: 2 .  dicyclomine (BENTYL) 10 MG capsule, TAKE 1 CAPSULE BY MOUTH THREE TIMES A DAY BEFORE MEALS, Disp: 90 capsule, Rfl: 0 .  FLECTOR 1.3 % PTCH,  Place 1 patch onto the skin daily as needed (pain). , Disp: , Rfl: 0 .  fluticasone (FLONASE) 50 MCG/ACT nasal spray, Place 2 sprays into the nose daily as needed for allergies or rhinitis. , Disp: , Rfl:  .  Lancets (ACCU-CHEK SOFT TOUCH) lancets, Use as instructed, Disp: 100 each, Rfl: 12 .  Lancets Misc. (ACCU-CHEK FASTCLIX LANCET) KIT, Use as instructed., Disp: 1 kit, Rfl: 12 .  metFORMIN (GLUCOPHAGE) 850 MG tablet, Take 1 tablet (850 mg total) by mouth 2 (two) times daily with a meal., Disp: 180 tablet, Rfl: 1 .  NITROSTAT 0.4 MG SL tablet, place 1 tablet under the tongue every 5 minutes for UP TO 3 doses if needed for angina as directed by prescriber, Disp: 25 tablet, Rfl: 1 .  nortriptyline (PAMELOR) 10 MG capsule, take 1 capsule at bedtime daily, Disp: , Rfl: 1 .  omeprazole (PRILOSEC) 20 MG capsule, Take 1 capsule (20 mg total) by mouth every morning., Disp: 30 capsule, Rfl: 5 .  RANEXA 1000 MG SR tablet, TAKE 1 TABLET BY MOUTH TWICE DAILY, Disp: 60 tablet, Rfl: 0 .  Selenium Sulf-Pyrithione-Urea (SELENIUM SULFIDE) 2.25 % SHAM, Apply 1 application topically daily as needed. , Disp: , Rfl: 0 .  Selenium Sulfide 2.25 % LOTN, Apply 5 mLs topically daily., Disp: 180 mL, Rfl: 2 .  tiZANidine (ZANAFLEX) 4 MG tablet, take 1 tablet by mouth every 6 hours if needed for muscle spasm, Disp: 30 tablet, Rfl: 0 .  venlafaxine (EFFEXOR)  37.5 MG tablet, TK 1 T PO D FOR 1 WEEK. INCREASE TO 1 T 2 TIMES A DAY, Disp: , Rfl: 3 .  carvedilol (COREG) 12.5 MG tablet, TAKE 1 TABLET BY MOUTH TWICE DAILY (Patient not taking: Reported on 06/09/2018), Disp: 60 tablet, Rfl: 3 .  hydrochlorothiazide (HYDRODIURIL) 12.5 MG tablet, Take 1 tablet (12.5 mg total) by mouth daily., Disp: 90 tablet, Rfl: 0 .  isosorbide mononitrate (IMDUR) 30 MG 24 hr tablet, Take 1 tablet (30 mg total) by mouth daily., Disp: 90 tablet, Rfl: 3 .  losartan (COZAAR) 100 MG tablet, Take 1 tablet (100 mg total) by mouth daily., Disp: 90 tablet, Rfl: 3  Allergies  Allergen Reactions  . Gabapentin Other (See Comments)    Groggy-Mood Changes  . Latex Rash  . Penicillins Hives and Rash    Has patient had a PCN reaction causing immediate rash, facial/tongue/throat swelling, SOB or lightheadedness with hypotension: Yes Has patient had a PCN reaction causing severe rash involving mucus membranes or skin necrosis: No Has patient had a PCN reaction that required hospitalization No Has patient had a PCN reaction occurring within the last 10 years: No If all of the above answers are "NO", then may proceed with Cephalosporin use.      ROS  Ten systems reviewed and is negative except as mentioned in HPI   Objective  Vitals:   06/09/18 1054  BP: (!) 178/98  Pulse: 83  Resp: 16  SpO2: 98%  Weight: 260 lb (117.9 kg)  Height: '5\' 11"'$  (1.803 m)    Body mass index is 36.26 kg/m.  Physical Exam  Constitutional: Patient appears well-developed and well-nourished. Obese  No distress.  HEENT: head atraumatic, normocephalic, pupils equal and reactive to light, neck supple, throat within normal limits Cardiovascular: Normal rate, regular rhythm and normal heart sounds.  No murmur heard. No BLE edema. Pulmonary/Chest: Effort normal and breath sounds normal. No respiratory distress. Abdominal: Soft.  There is no tenderness. Psychiatric: Patient has  a normal mood and affect.  behavior is normal. Judgment and thought content normal. Neurological : normal neuro exam, normal cranial nerves, romberg negative, normal grip, normal sensation   Recent Results (from the past 2160 hour(s))  POCT HgB A1C     Status: Abnormal   Collection Time: 03/15/18  3:48 PM  Result Value Ref Range   Hemoglobin A1C 7.0   POCT UA - Microalbumin     Status: Abnormal   Collection Time: 03/15/18  3:49 PM  Result Value Ref Range   Microalbumin Ur, POC 50 mg/L   Creatinine, POC  mg/dL   Albumin/Creatinine Ratio, Urine, POC    Lipid panel     Status: Abnormal   Collection Time: 03/15/18  3:50 PM  Result Value Ref Range   Cholesterol 135 <200 mg/dL   HDL 37 (L) >40 mg/dL   Triglycerides 125 <150 mg/dL   LDL Cholesterol (Calc) 77 mg/dL (calc)    Comment: Reference range: <100 . Desirable range <100 mg/dL for primary prevention;   <70 mg/dL for patients with CHD or diabetic patients  with > or = 2 CHD risk factors. Marland Kitchen LDL-C is now calculated using the Martin-Hopkins  calculation, which is a validated novel method providing  better accuracy than the Friedewald equation in the  estimation of LDL-C.  Cresenciano Genre et al. Annamaria Helling. 6073;710(62): 2061-2068  (http://education.[employer].com/faq/FAQ164)    Total CHOL/HDL Ratio 3.6 <5.0 (calc)   Non-HDL Cholesterol (Calc) 98 <130 mg/dL (calc)    Comment: For patients with diabetes plus 1 major ASCVD risk  factor, treating to a non-HDL-C goal of <100 mg/dL  (LDL-C of <70 mg/dL) is considered a therapeutic  option.       PHQ2/9: Depression screen [facility] 2/9 06/09/2018 06/28/2017 03/10/2017 11/26/2016 07/10/2016  Decreased Interest 0 0 0 0 0  Down, Depressed, Hopeless 0 0 0 0 0  PHQ - 2 Score 0 0 0 0 0     Fall Risk: Fall Risk  06/09/2018 03/15/2018 06/28/2017 03/10/2017 11/26/2016  Falls in the past year? No Yes No No No  Number falls in past yr: - 2 or more - - -  Injury with Fall? - Yes - - -  Comment - - - - -  Follow up - Falls  evaluation completed - - -     Functional Status Survey: Is the patient deaf or have difficulty hearing?: No Does the patient have difficulty seeing, even when wearing glasses/contacts?: No Does the patient have difficulty concentrating, remembering, or making decisions?: No Does the patient have difficulty walking or climbing stairs?: No Does the patient have difficulty dressing or bathing?: No Does the patient have difficulty doing errands alone such as visiting a doctor's office or shopping?: No    Assessment & Plan  1. Uncontrolled hypertension  - CT ANGIO HEAD W OR WO CONTRAST; Future - hydrochlorothiazide (HYDRODIURIL) 12.5 MG tablet; Take 1 tablet (12.5 mg total) by mouth daily.  Dispense: 90 tablet; Refill: 0  He will return in 24 hours for bp check   2. Sudden onset of severe headache  - CT ANGIO HEAD W OR WO CONTRAST; Future  If unable to get CT right away or symptoms gets worse advised to call 911 and go to [facility] possible TID, I will contact Dr. Melrose Nakayama today   Discussed results with patient after verbal results from radiology department.Contacted Dr. Melrose Nakayama and patient will continue on aspirin at this time and aggressively treat his bp   3. History of CVA  in adulthood  Based on MRI done 02/2017 - stable

## 2018-06-09 NOTE — Telephone Encounter (Signed)
Copied from [place] [id number]. Topic: General - Other >> Jun 09, 2018  1:55 PM Carolyn Stare wrote:  Melody with [facility] sa[facility] pt is having a CT scan today and it need to be precert  [phone]   PA was performed via [employer] but clinical notes must be submitted via fax [phone].

## 2018-06-21 ENCOUNTER — Ambulatory Visit: Payer: Medicaid Other | Admitting: Family Medicine

## 2018-06-21 ENCOUNTER — Encounter: Payer: Self-pay | Admitting: Family Medicine

## 2018-06-21 VITALS — BP 118/82 | HR 78 | Temp 97.5°F | Resp 16 | Ht 71.0 in | Wt 254.5 lb

## 2018-06-21 DIAGNOSIS — I10 Essential (primary) hypertension: Secondary | ICD-10-CM

## 2018-06-21 DIAGNOSIS — Z8673 Personal history of transient ischemic attack (TIA), and cerebral infarction without residual deficits: Secondary | ICD-10-CM | POA: Diagnosis not present

## 2018-06-21 DIAGNOSIS — H9313 Tinnitus, bilateral: Secondary | ICD-10-CM | POA: Diagnosis not present

## 2018-06-21 DIAGNOSIS — E1129 Type 2 diabetes mellitus with other diabetic kidney complication: Secondary | ICD-10-CM | POA: Diagnosis not present

## 2018-06-21 DIAGNOSIS — E782 Mixed hyperlipidemia: Secondary | ICD-10-CM

## 2018-06-21 DIAGNOSIS — B360 Pityriasis versicolor: Secondary | ICD-10-CM | POA: Diagnosis not present

## 2018-06-21 DIAGNOSIS — R809 Proteinuria, unspecified: Secondary | ICD-10-CM

## 2018-06-21 LAB — POCT GLYCOSYLATED HEMOGLOBIN (HGB A1C): Hemoglobin A1C: 7 % — AB (ref 4.0–5.6)

## 2018-06-21 MED ORDER — LOSARTAN POTASSIUM-HCTZ 50-12.5 MG PO TABS: 1.0000 | ORAL_TABLET | Freq: Every day | ORAL | 3 refills | Status: DC

## 2018-06-21 MED ORDER — LOSARTAN POTASSIUM-HCTZ 100-12.5 MG PO TABS: 1.0000 | ORAL_TABLET | Freq: Every day | ORAL | 3 refills | Status: DC

## 2018-06-21 MED ORDER — SELENIUM SULFIDE 2.5 % EX LOTN: 1.0000 "application " | TOPICAL_LOTION | Freq: Every day | CUTANEOUS | 12 refills | Status: DC | PRN

## 2018-06-21 NOTE — Progress Notes (Signed)
Name: [name] [name]   MRN: [id number]    DOB: [birth year]   Date:06/21/2018       Progress Note  Subjective  Chief Complaint  Chief Complaint  Patient presents with  . Hypertension    Headaches, SOB once day and ears are still ringing in both ears. Still needs to schedule eye appointment  . Follow-up    Referral for ENT-Dr. Freda Munro    HPI  Tinnitus: he has a long history of tinnitus and hearing loss , seen by ENT in the past and would like to go back since symptoms are getting worse.   HTN: he is doing well on Losartan and HcTZ, he has not been taking Norvasc. Since bp went back to normal, headache resolved, except for the chronic neck pain . No recent chest pain or palpitation   DMII: hgbA1C is at goal, denies polyphagia, polydipsia or polyuria. HgbA1C is up at 7.0 % but still at goal. Advised eye exam  Morbid obesity: discussed again importance of life style modification for weight loss. He has multiple co-morbidities.    Patient Active Problem List   Diagnosis Date Noted  . Groin strain 03/30/2018  . Seizure ([place]) 02/22/2018  . Mild cognitive impairment 11/17/2017  . Radiculopathy of cervical region 11/17/2017  . Stenosis of carotid artery 09/08/2017  . Cervical stenosis of spine 09/08/2017  . B12 deficiency 06/28/2017  . Headache disorder 02/15/2017  . Memory loss or impairment 02/15/2017  . Rectal bleeding 02/11/2017  . Coronary artery disease involving native coronary artery of native heart   . Radiculitis of left cervical region 03/05/2016  . Diabetic neuropathy associated with type 2 diabetes mellitus ([place]) 12/05/2015  . Patellar subluxation 06/05/2015  . Allergic rhinitis, seasonal 06/05/2015  . Chronic constipation 06/05/2015  . Chronic lower back pain 06/05/2015  . Morbid obesity due to excess calories ([place]) 06/05/2015  . Vitamin D deficiency 06/05/2015  . Central sleep apnea 06/05/2015  . 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 Nerve root pain 06/05/2015  . Acquired  polycythemia 06/05/2015  . Anterior knee pain 06/05/2015  . Dysmetabolic syndrome 87/56/4332  . Failure of erection 06/05/2015  . Gastro-esophageal reflux disease without esophagitis 06/05/2015  . Neuropathy 06/05/2015  . CAD (coronary artery disease)   . Angina, class III ([place]) 05/23/2015  . Essential hypertension 05/23/2015  . Hyperlipidemia 05/23/2015  . Inguinal hernia without mention of obstruction or gangrene, recurrent unilateral or unspecified 03/24/2013    Past Surgical History:  Procedure Laterality Date  . BACK SURGERY  12/2008   X2-LUMBAR  . CARDIAC CATHETERIZATION N/A 05/30/2015   Procedure: Left Heart Cath;  Surgeon: Wellington Hampshire, MD;  Location: [place] [facility];  Service: Cardiovascular;  Laterality: N/A;  . CARDIAC CATHETERIZATION N/A 04/16/2016   Procedure: Left Heart Cath and Coronary Angiography;  Surgeon: Wellington Hampshire, MD;  Location: [place] [facility];  Service: Cardiovascular;  Laterality: N/A;  . COLONOSCOPY  2014   Dr. [name]  . COLONOSCOPY WITH PROPOFOL N/A 12/25/2016   Procedure: COLONOSCOPY WITH PROPOFOL;  Surgeon: Jonathon Bellows, MD;  Location: [facility];  Service: Endoscopy;  Laterality: N/A;  . COLONOSCOPY WITH PROPOFOL N/A 01/29/2017   Procedure: COLONOSCOPY WITH PROPOFOL;  Surgeon: Jonathon Bellows, MD;  Location: [facility];  Service: Endoscopy;  Laterality: N/A;  . EVALUATION UNDER ANESTHESIA WITH HEMORRHOIDECTOMY N/A 02/24/2017   Procedure: EXAM UNDER ANESTHESIA WITH POSSIBLE EXCISION OF INTERNAL HEMORRHOIDS;  Surgeon: Olean Ree, MD;  Location: [facility];  Service: General;  Laterality: N/A;  .  FISSURECTOMY  02/24/2017   Procedure: FISSURECTOMY;  Surgeon: Olean Ree, MD;  Location: [facility];  Service: General;;  . HERNIA REPAIR Right 1991  . INGUINAL HERNIA REPAIR Right 2012   Dr [name]  . INGUINAL HERNIA REPAIR Right 2014   Dr [name]    Family History  Problem Relation Age of Onset  . Heart Problems Mother   . Heart Problems  Father 13       myocardial infarction  . Mental illness Brother     Social History   Socioeconomic History  . Marital status: Single    Spouse name: Not on file  . Number of children: Not on file  . Years of education: Not on file  . Highest education level: Not on file  Occupational History  . Not on file  Social Needs  . Financial resource strain: Not on file  . Food insecurity:    Worry: Not on file    Inability: Not on file  . Transportation needs:    Medical: Not on file    Non-medical: Not on file  Tobacco Use  . Smoking status: Former Smoker    Years: 2.00    Types: Cigars    Start date: 12/28/2002    Last attempt to quit: 06/04/2005    Years since quitting: 13.0  . Smokeless tobacco: Never Used  Substance and Sexual Activity  . Alcohol use: Yes    Alcohol/week: 0.6 oz    Types: 1 Glasses of wine per week    Comment: RARE [employer]  . Drug use: No  . Sexual activity: Yes    Partners: Female  Lifestyle  . Physical activity:    Days per week: Not on file    Minutes per session: Not on file  . Stress: Not on file  Relationships  . Social connections:    Talks on phone: Not on file    Gets together: Not on file    Attends religious service: Not on file    Active member of club or organization: Not on file    Attends meetings of clubs or organizations: Not on file    Relationship status: Not on file  . Intimate partner violence:    Fear of current or ex partner: Not on file    Emotionally abused: Not on file    Physically abused: Not on file    Forced sexual activity: Not on file  Other Topics Concern  . Not on file  Social History Narrative  . Not on file     Current Outpatient Medications:  .  ACCU-CHEK FASTCLIX LANCETS MISC, Use as directed., Disp: 102 each, Rfl: 1 .  ACCU-CHEK SMARTVIEW test strip, Check fsbs twice daily E11.9, Disp: 100 each, Rfl: 2 .  aspirin 81 MG tablet, Take 81 mg by mouth daily., Disp: , Rfl:  .  atorvastatin (LIPITOR) 80 MG  tablet, Take 1 tablet (80 mg total) by mouth every morning., Disp: 30 tablet, Rfl: 5 .  cetirizine (ZYRTEC) 10 MG tablet, Take 10 mg by mouth daily as needed for allergies. , Disp: , Rfl: 1 .  Cyanocobalamin (B-12) 1000 MCG SUBL, Place 1 tablet under the tongue daily. (Patient taking differently: Place 1 tablet under the tongue every 30 (thirty) days. ), Disp: 30 each, Rfl: 0 .  diclofenac sodium (VOLTAREN) 1 % GEL, Apply 4 g topically 4 (four) times daily. (Patient taking differently: Apply 4 g topically 2 (two) times daily as needed. ), Disp: 100 g, Rfl: 2 .  dicyclomine (BENTYL) 10 MG capsule, TAKE 1 CAPSULE BY MOUTH THREE TIMES A DAY BEFORE MEALS, Disp: 90 capsule, Rfl: 0 .  FLECTOR 1.3 % PTCH, Place 1 patch onto the skin daily as needed (pain). , Disp: , Rfl: 0 .  fluticasone (FLONASE) 50 MCG/ACT nasal spray, Place 2 sprays into the nose daily as needed for allergies or rhinitis. , Disp: , Rfl:  .  Lancets (ACCU-CHEK SOFT TOUCH) lancets, Use as instructed, Disp: 100 each, Rfl: 12 .  Lancets Misc. (ACCU-CHEK FASTCLIX LANCET) KIT, Use as instructed., Disp: 1 kit, Rfl: 12 .  metFORMIN (GLUCOPHAGE) 850 MG tablet, Take 1 tablet (850 mg total) by mouth 2 (two) times daily with a meal., Disp: 180 tablet, Rfl: 1 .  NITROSTAT 0.4 MG SL tablet, place 1 tablet under the tongue every 5 minutes for UP TO 3 doses if needed for angina as directed by prescriber, Disp: 25 tablet, Rfl: 1 .  nortriptyline (PAMELOR) 10 MG capsule, take 1 capsule at bedtime daily, Disp: , Rfl: 1 .  omeprazole (PRILOSEC) 20 MG capsule, Take 1 capsule (20 mg total) by mouth every morning., Disp: 30 capsule, Rfl: 5 .  RANEXA 1000 MG SR tablet, TAKE 1 TABLET BY MOUTH TWICE DAILY, Disp: 60 tablet, Rfl: 0 .  tiZANidine (ZANAFLEX) 4 MG tablet, take 1 tablet by mouth every 6 hours if needed for muscle spasm, Disp: 30 tablet, Rfl: 0 .  venlafaxine (EFFEXOR) 75 MG tablet, Take 1 tablet by mouth daily., Disp: , Rfl: 3 .  carvedilol (COREG)  12.5 MG tablet, TAKE 1 TABLET BY MOUTH TWICE DAILY (Patient not taking: Reported on 06/21/2018), Disp: 60 tablet, Rfl: 3 .  isosorbide mononitrate (IMDUR) 30 MG 24 hr tablet, Take 1 tablet (30 mg total) by mouth daily., Disp: 90 tablet, Rfl: 3 .  losartan-hydrochlorothiazide (HYZAAR) 100-12.5 MG tablet, Take 1 tablet by mouth daily., Disp: 90 tablet, Rfl: 3 .  selenium sulfide (SELSUN) 2.5 % shampoo, Apply 1 application topically daily as needed for irritation., Disp: 118 mL, Rfl: 12  Allergies  Allergen Reactions  . Gabapentin Other (See Comments)    Groggy-Mood Changes  . Latex Rash  . Penicillins Hives and Rash    Has patient had a PCN reaction causing immediate rash, facial/tongue/throat swelling, SOB or lightheadedness with hypotension: Yes Has patient had a PCN reaction causing severe rash involving mucus membranes or skin necrosis: No Has patient had a PCN reaction that required hospitalization No Has patient had a PCN reaction occurring within the last 10 years: No If all of the above answers are "NO", then may proceed with Cephalosporin use.      ROS  Constitutional: Negative for fever or weight change.  Respiratory: Negative for cough and shortness of breath.   Cardiovascular: Negative for chest pain or palpitations.  Gastrointestinal: Negative for abdominal pain, no bowel changes.  Musculoskeletal: Negative for gait problem or joint swelling.  Skin: Negative for rash.  Neurological: Negative for dizziness or headache.  No other specific complaints in a complete review of systems (except as listed in HPI above).  Objective  Vitals:   06/21/18 1105  BP: 118/82  Pulse: 78  Resp: 16  Temp: (!) 97.5 F (36.4 C)  TempSrc: Oral  SpO2: 97%  Weight: 254 lb 8 oz (115.4 kg)  Height: '5\' 11"'  (1.803 m)    Body mass index is 35.5 kg/m.  Physical Exam  Constitutional: Patient appears well-developed and well-nourished. Obese No distress.  HEENT: head atraumatic,  normocephalic,  pupils equal and reactive to light, ears normal TM,  neck supple, throat within normal limits Cardiovascular: Normal rate, regular rhythm and normal heart sounds.  No murmur heard. No BLE edema. Pulmonary/Chest: Effort normal and breath sounds normal. No respiratory distress. Abdominal: Soft.  There is no tenderness. Psychiatric: Patient has a normal mood and affect. behavior is normal. Judgment and thought content normal.  Recent Results (from the past 2160 hour(s))  I-STAT creatinine     Status: None   Collection Time: 06/09/18 12:44 PM  Result Value Ref Range   Creatinine, Ser 1.00 0.61 - 1.24 mg/dL  POCT glycosylated hemoglobin (Hb A1C)     Status: Abnormal   Collection Time: 06/21/18 11:51 AM  Result Value Ref Range   Hemoglobin A1C 7.0 (A) 4.0 - 5.6 %   HbA1c POC (<> result, manual entry)  4.0 - 5.6 %   HbA1c, POC (prediabetic range)  5.7 - 6.4 %   HbA1c, POC (controlled diabetic range)  0.0 - 7.0 %     PHQ2/9: Depression screen [facility] 2/9 06/09/2018 06/28/2017 03/10/2017 11/26/2016 07/10/2016  Decreased Interest 0 0 0 0 0  Down, Depressed, Hopeless 0 0 0 0 0  PHQ - 2 Score 0 0 0 0 0    Fall Risk: Fall Risk  06/09/2018 03/15/2018 06/28/2017 03/10/2017 11/26/2016  Falls in the past year? No Yes No No No  Number falls in past yr: - 2 or more - - -  Injury with Fall? - Yes - - -  Comment - - - - -  Follow up - Falls evaluation completed - - -     Assessment & Plan    1. Essential hypertension  Doing well, bp is at goal  - losartan-hydrochlorothiazide (HYZAAR) 100-12.5 MG tablet; Take 1 tablet by mouth daily.  Dispense: 90 tablet; Refill: 3  2. Tinea versicolor  Needs refills of medication   3. Controlled type 2 diabetes mellitus with microalbuminuria, without long-term current use of insulin (HCC)  - POCT glycosylated hemoglobin (Hb A1C)  4. Morbid obesity ([place])  Discussed with the patient the risk posed by an increased BMI. Discussed importance of  portion control, calorie counting and at least 150 minutes of physical activity weekly. Avoid sweet beverages and drink more water. Eat at least 6 servings of fruit and vegetables daily   5. History of CVA in adulthood  Reviewed CTA old stroke   6. Mixed hyperlipidemia  Continue statin   7. Tinnitus of both ears  - Ambulatory referral to ENT

## 2018-06-27 DIAGNOSIS — M542 Cervicalgia: Secondary | ICD-10-CM | POA: Diagnosis not present

## 2018-06-27 DIAGNOSIS — M47812 Spondylosis without myelopathy or radiculopathy, cervical region: Secondary | ICD-10-CM | POA: Diagnosis not present

## 2018-07-03 ENCOUNTER — Other Ambulatory Visit: Payer: Self-pay | Admitting: Gastroenterology

## 2018-07-03 ENCOUNTER — Other Ambulatory Visit: Payer: Self-pay | Admitting: Cardiovascular Disease

## 2018-07-04 ENCOUNTER — Encounter: Payer: Self-pay | Admitting: Cardiovascular Disease

## 2018-07-04 DIAGNOSIS — H903 Sensorineural hearing loss, bilateral: Secondary | ICD-10-CM | POA: Diagnosis not present

## 2018-07-04 DIAGNOSIS — R42 Dizziness and giddiness: Secondary | ICD-10-CM | POA: Diagnosis not present

## 2018-07-22 DIAGNOSIS — R42 Dizziness and giddiness: Secondary | ICD-10-CM | POA: Diagnosis not present

## 2018-07-26 DIAGNOSIS — R42 Dizziness and giddiness: Secondary | ICD-10-CM | POA: Diagnosis not present

## 2018-07-30 ENCOUNTER — Other Ambulatory Visit: Payer: Self-pay | Admitting: Cardiovascular Disease

## 2018-07-30 ENCOUNTER — Other Ambulatory Visit: Payer: Self-pay | Admitting: Family Medicine

## 2018-07-30 DIAGNOSIS — E1129 Type 2 diabetes mellitus with other diabetic kidney complication: Secondary | ICD-10-CM

## 2018-07-30 DIAGNOSIS — E1140 Type 2 diabetes mellitus with diabetic neuropathy, unspecified: Secondary | ICD-10-CM

## 2018-07-30 DIAGNOSIS — R809 Proteinuria, unspecified: Principal | ICD-10-CM

## 2018-08-13 ENCOUNTER — Other Ambulatory Visit: Payer: Self-pay | Admitting: Family Medicine

## 2018-08-13 DIAGNOSIS — E782 Mixed hyperlipidemia: Secondary | ICD-10-CM

## 2018-08-18 ENCOUNTER — Other Ambulatory Visit: Payer: Self-pay | Admitting: Family Medicine

## 2018-08-18 DIAGNOSIS — I10 Essential (primary) hypertension: Secondary | ICD-10-CM

## 2018-08-19 DIAGNOSIS — H524 Presbyopia: Secondary | ICD-10-CM | POA: Diagnosis not present

## 2018-08-19 LAB — HM DIABETES EYE EXAM

## 2018-08-22 ENCOUNTER — Encounter: Payer: Self-pay | Admitting: Family Medicine

## 2018-09-05 DIAGNOSIS — M542 Cervicalgia: Secondary | ICD-10-CM | POA: Diagnosis not present

## 2018-09-05 DIAGNOSIS — R200 Anesthesia of skin: Secondary | ICD-10-CM | POA: Diagnosis not present

## 2018-09-15 ENCOUNTER — Other Ambulatory Visit: Payer: Self-pay | Admitting: Family Medicine

## 2018-09-15 DIAGNOSIS — I10 Essential (primary) hypertension: Secondary | ICD-10-CM

## 2018-09-15 NOTE — Telephone Encounter (Signed)
Refill request for general medication. HCTZ to [facility].   Last office visit 06/21/2018   Follow up on 10/21/2018

## 2018-09-16 ENCOUNTER — Other Ambulatory Visit: Payer: Self-pay | Admitting: Family Medicine

## 2018-09-19 ENCOUNTER — Other Ambulatory Visit: Payer: Self-pay

## 2018-09-19 MED ORDER — SILDENAFIL CITRATE 20 MG PO TABS: ORAL_TABLET | ORAL | 0 refills | Status: DC

## 2018-09-19 NOTE — Telephone Encounter (Signed)
Refill request for general medication. Sildenafil  Last office visit: 06/21/2018   Follow up on 10/21/2018

## 2018-09-20 ENCOUNTER — Telehealth: Payer: Self-pay | Admitting: Cardiovascular Disease

## 2018-09-20 ENCOUNTER — Other Ambulatory Visit: Payer: Self-pay | Admitting: *Deleted

## 2018-09-20 MED ORDER — RANOLAZINE ER 1000 MG PO TB12: 1000.0000 mg | ORAL_TABLET | Freq: Two times a day (BID) | ORAL | 0 refills | Status: DC

## 2018-09-20 NOTE — Telephone Encounter (Signed)
Requested Prescriptions   Signed Prescriptions Disp Refills  . ranolazine (RANEXA) 1000 MG SR tablet 180 tablet 0    Sig: Take 1 tablet (1,000 mg total) by mouth 2 (two) times daily.    Authorizing Provider: [name] A    Ordering User: Britt Bottom

## 2018-09-20 NOTE — Telephone Encounter (Signed)
°*  STAT* If patient is at the pharmacy, call can be transferred to refill team.   1. Which medications need to be refilled? (please list name of each medication and dose if known) Ranexa 1000 mg po BID   2. Which pharmacy/location (including street and city if local pharmacy) is medication to be sent to? [place]    3. Do they need a 30 day or 90 day supply? 90  Out of meds needs tomorrow

## 2018-09-21 DIAGNOSIS — R42 Dizziness and giddiness: Secondary | ICD-10-CM | POA: Diagnosis not present

## 2018-09-21 DIAGNOSIS — M542 Cervicalgia: Secondary | ICD-10-CM | POA: Diagnosis not present

## 2018-09-21 DIAGNOSIS — R51 Headache: Secondary | ICD-10-CM | POA: Diagnosis not present

## 2018-09-21 DIAGNOSIS — R413 Other amnesia: Secondary | ICD-10-CM | POA: Diagnosis not present

## 2018-09-23 ENCOUNTER — Other Ambulatory Visit: Payer: Self-pay | Admitting: Acute Care

## 2018-09-23 DIAGNOSIS — R42 Dizziness and giddiness: Secondary | ICD-10-CM

## 2018-09-27 DIAGNOSIS — M4722 Other spondylosis with radiculopathy, cervical region: Secondary | ICD-10-CM | POA: Diagnosis not present

## 2018-09-27 DIAGNOSIS — M542 Cervicalgia: Secondary | ICD-10-CM | POA: Diagnosis not present

## 2018-09-27 DIAGNOSIS — G5601 Carpal tunnel syndrome, right upper limb: Secondary | ICD-10-CM | POA: Diagnosis not present

## 2018-10-05 ENCOUNTER — Ambulatory Visit (INDEPENDENT_AMBULATORY_CARE_PROVIDER_SITE_OTHER): Payer: Medicaid Other

## 2018-10-05 ENCOUNTER — Ambulatory Visit
Admission: RE | Admit: 2018-10-05 | Discharge: 2018-10-05 | Disposition: A | Discharge status: Home / Self Care | Payer: Medicaid Other | Source: Ambulatory Visit | Attending: Acute Care | Admitting: Acute Care

## 2018-10-05 DIAGNOSIS — I6782 Cerebral ischemia: Secondary | ICD-10-CM | POA: Insufficient documentation

## 2018-10-05 DIAGNOSIS — R413 Other amnesia: Secondary | ICD-10-CM | POA: Insufficient documentation

## 2018-10-05 DIAGNOSIS — Z23 Encounter for immunization: Secondary | ICD-10-CM | POA: Diagnosis not present

## 2018-10-05 DIAGNOSIS — R42 Dizziness and giddiness: Secondary | ICD-10-CM

## 2018-10-05 DIAGNOSIS — D170 Benign lipomatous neoplasm of skin and subcutaneous tissue of head, face and neck: Secondary | ICD-10-CM | POA: Diagnosis not present

## 2018-10-05 NOTE — Progress Notes (Signed)
flu

## 2018-10-21 ENCOUNTER — Ambulatory Visit: Payer: Medicaid Other | Admitting: Family Medicine

## 2018-10-25 ENCOUNTER — Ambulatory Visit (INDEPENDENT_AMBULATORY_CARE_PROVIDER_SITE_OTHER): Payer: Medicaid Other | Admitting: Cardiovascular Disease

## 2018-10-25 ENCOUNTER — Encounter: Payer: Self-pay | Admitting: Cardiovascular Disease

## 2018-10-25 VITALS — BP 120/82 | HR 70 | Ht 71.0 in | Wt 255.0 lb

## 2018-10-25 DIAGNOSIS — I25118 Atherosclerotic heart disease of native coronary artery with other forms of angina pectoris: Secondary | ICD-10-CM | POA: Diagnosis not present

## 2018-10-25 DIAGNOSIS — I10 Essential (primary) hypertension: Secondary | ICD-10-CM | POA: Diagnosis not present

## 2018-10-25 DIAGNOSIS — E785 Hyperlipidemia, unspecified: Secondary | ICD-10-CM

## 2018-10-25 NOTE — Progress Notes (Signed)
Cardiology Office Note   Date:  10/25/2018   ID:  [name] [name], DOB 11-08-1961, MRN [id number]  PCP:  Steele Sizer, MD  Cardiologist:   [name], MD   Chief Complaint  Patient presents with  . OTHER    PT COMPLAINS OF SOB WITH ACTIVITY,TOOK NITRO 10/25 , REVIEWED MEDS VERBALLY WITH PT      History of Present Illness: [name] [name] is a 57 y.o. male who presents for a follow-up visit regarding mild -moderate one-vessel coronary artery disease with stable angina and suspected endothelial dysfunction. He has known history of type 2 diabetes, hypertension, hyperlipidemia and obesity. He is a previous smoker. Cardiac catheterization in June 2016 showed moderate mid LAD stenosis (50%) with an FFR ratio of 0.83 . LV systolic function with normal with normal left ventricular end-diastolic pressure. He had worsening angina in April 2017. Repeat cardiac catheterization showed improvement in mid LAD stenosis to 30% and no evidence of obstructive coronary artery disease.  He does have sleep apnea on CPAP .  The patient had recurrent episodes of loss of consciousness of unclear etiology.  Echocardiogram showed normal ejection fraction.  2-week outpatient monitor showed no significant arrhythmia.  It was felt that his episodes are not cardiac in origin and he has been evaluated by neurology.  Some mild memory impairment is described.  The patient also had recurrent concussions growing up when he played sports. He has been doing reasonably well.  He has occasional episodes of chest pain that responds to nitroglycerin.  He is trying to follow a healthier lifestyle and has been exercising and losing some weight.   Past Medical History:  Diagnosis Date  . Allergy   . Angina, class III ([place])   . Chronic constipation   . Degeneration of intervertebral disc of cervical region   . Diabetes mellitus without complication ([place])   . Diastolic dysfunction    a. 06/2017 Echo: EF 60-65%, no  rwma, Gr1 DD.  Marland Kitchen Dyspnea   . GERD (gastroesophageal reflux disease)   . Hernia 1991   02/10/2012-RIH repair  . Hyperlipidemia   . Hypertension 2008  . Nerve root pain   . Neuropathy   . Non-obstructive CAD (coronary artery disease)    a. 05/30/2015 cath: LM nl, mLAD 50% (FFR 0.83), LCx minor irregs, RCA minor irregs, EF 55-65%-->Med Rx; b. 03/2016 Cath: LM nl LAD 58m D1/2/3 min irregs, LCX min irregs, OM1/2/3 nl, RCA min irregs, RPDA/RPAV/RPL1/RPL2 nl-->Med Rx.  . Obesity, unspecified 2012  . Personal history of tobacco use, presenting hazards to health 2012  . Polycythemia   . Recurrent Right Inguinal Hernia Repair 02/09/2011, 04[facility]07/2013   [facility] . Sleep apnea    a. On CPAP;  b. 06/2017 Echo: no PAH.  .Marland KitchenUmbilical hernia without mention of obstruction or gangrene 02/09/2011   [facility] . Vitamin D deficiency     Past Surgical History:  Procedure Laterality Date  . BACK SURGERY  12/2008   X2-LUMBAR  . CARDIAC CATHETERIZATION N/A 05/30/2015   Procedure: Left Heart Cath;  Surgeon: MWellington Hampshire MD;  [facility];  Service: Cardiovascular;  Laterality: N/A;  . CARDIAC CATHETERIZATION N/A 04/16/2016   Procedure: Left Heart Cath and Coronary Angiography;  Surgeon: MWellington Hampshire MD;  [place];  Service: Cardiovascular;  Laterality: N/A;  . COLONOSCOPY  2014   Dr. SJamal Collin . COLONOSCOPY WITH PROPOFOL N/A 12/25/2016   Procedure:  COLONOSCOPY WITH PROPOFOL;  Surgeon: Jonathon Bellows, MD;  Location: [facility];  Service: Endoscopy;  Laterality: N/A;  . COLONOSCOPY WITH PROPOFOL N/A 01/29/2017   Procedure: COLONOSCOPY WITH PROPOFOL;  Surgeon: Jonathon Bellows, MD;  Location: [facility];  Service: Endoscopy;  Laterality: N/A;  . EVALUATION UNDER ANESTHESIA WITH HEMORRHOIDECTOMY N/A 02/24/2017   Procedure: EXAM UNDER ANESTHESIA WITH POSSIBLE EXCISION OF INTERNAL HEMORRHOIDS;  Surgeon: Olean Ree, MD;  Location: [facility];  Service: General;   Laterality: N/A;  . FISSURECTOMY  02/24/2017   Procedure: FISSURECTOMY;  Surgeon: Olean Ree, MD;  Location: [facility];  Service: General;;  . HERNIA REPAIR Right 1991  . INGUINAL HERNIA REPAIR Right 2012   Dr [name]  . INGUINAL HERNIA REPAIR Right 2014   Dr [name]     Current Outpatient Medications  Medication Sig Dispense Refill  . ACCU-CHEK FASTCLIX LANCETS MISC Use as directed. 102 each 1  . ACCU-CHEK SMARTVIEW test strip Check fsbs twice daily E11.9 100 each 2  . aspirin 81 MG tablet Take 81 mg by mouth daily.    Marland Kitchen atorvastatin (LIPITOR) 80 MG tablet TAKE 1 TABLET BY MOUTH EVERY DAY 90 tablet 0  . carvedilol (COREG) 12.5 MG tablet TAKE 1 TABLET BY MOUTH TWICE DAILY 180 tablet 0  . cetirizine (ZYRTEC) 10 MG tablet Take 10 mg by mouth daily as needed for allergies.   1  . Cyanocobalamin (B-12) 1000 MCG SUBL Place 1 tablet under the tongue daily. (Patient taking differently: Place 1 tablet under the tongue every 30 (thirty) days. ) 30 each 0  . diclofenac sodium (VOLTAREN) 1 % GEL Apply 4 g topically 4 (four) times daily. (Patient taking differently: Apply 4 g topically 2 (two) times daily as needed. ) 100 g 2  . dicyclomine (BENTYL) 10 MG capsule TAKE 1 CAPSULE BY MOUTH THREE TIMES A DAY BEFORE MEALS 90 capsule 0  . FLECTOR 1.3 % PTCH Place 1 patch onto the skin daily as needed (pain).   0  . fluticasone (FLONASE) 50 MCG/ACT nasal spray Place 2 sprays into the nose daily as needed for allergies or rhinitis.     . hydrochlorothiazide (HYDRODIURIL) 12.5 MG tablet TAKE 1 TABLET(12.5 MG) BY MOUTH DAILY 90 tablet 0  . Lancets (ACCU-CHEK SOFT TOUCH) lancets Use as instructed 100 each 12  . Lancets Misc. (ACCU-CHEK FASTCLIX LANCET) KIT Use as instructed. 1 kit 12  . losartan-hydrochlorothiazide (HYZAAR) 100-12.5 MG tablet Take 1 tablet by mouth daily. 90 tablet 3  . metFORMIN (GLUCOPHAGE) 850 MG tablet TAKE 1 TABLET(850 MG) BY MOUTH TWICE DAILY WITH A MEAL 180 tablet 0  . NITROSTAT 0.4 MG SL  tablet place 1 tablet under the tongue every 5 minutes for UP TO 3 doses if needed for angina as directed by prescriber 25 tablet 1  . nortriptyline (PAMELOR) 10 MG capsule take 1 capsule at bedtime daily  1  . omeprazole (PRILOSEC) 20 MG capsule Take 1 capsule (20 mg total) by mouth every morning. 30 capsule 5  . ranolazine (RANEXA) 1000 MG SR tablet Take 1 tablet (1,000 mg total) by mouth 2 (two) times daily. 180 tablet 0  . selenium sulfide (SELSUN) 2.5 % shampoo Apply 1 application topically daily as needed for irritation. 118 mL 12  . tiZANidine (ZANAFLEX) 4 MG tablet take 1 tablet by mouth every 6 hours if needed for muscle spasm 30 tablet 0  . venlafaxine (EFFEXOR) 75 MG tablet Take 1 tablet by mouth daily.  3  . isosorbide mononitrate (  IMDUR) 30 MG 24 hr tablet Take 1 tablet (30 mg total) by mouth daily. 90 tablet 3   No current facility-administered medications for this visit.     Allergies:   Gabapentin; Latex; and Penicillins    Social History:  The patient  reports that he quit smoking about 13 years ago. His smoking use included cigars. He started smoking about 15 years ago. He quit after 2.00 years of use. He has never used smokeless tobacco. He reports that he drinks about 1.0 standard drinks of alcohol per week. He reports that he does not use drugs.   Family History:  The patient's family history includes Heart Problems in his mother; Heart Problems (age of onset: 5) in his father; Mental illness in his brother.    ROS:  Please see the history of present illness.   Otherwise, review of systems are positive for none.   All other systems are reviewed and negative.    PHYSICAL EXAM: VS:  BP 120/82   Pulse 70   Ht _0  (1.803 m)   Wt 255 lb (115.7 kg)   SpO2 98%   BMI 35.57 kg/m  , BMI Body mass index is 35.57 kg/m. GEN: Well nourished, well developed, in no acute distress  HEENT: normal  Neck: no JVD, carotid bruits, or masses Cardiac: RRR; no murmurs, rubs, or  gallops,no edema  Respiratory:  clear to auscultation bilaterally, normal work of breathing GI: soft, nontender, nondistended, + BS MS: no deformity or atrophy  Skin: warm and dry, no rash Neuro:  Strength and sensation are intact Psych: euthymic mood, full affect   EKG:  EKG is  ordered today.  EKG shows normal sinus rhythm with possible right ventricular hypertrophy, T wave abnormality in the inferior lateral leads suggestive of ischemia.  Suspect lead displacement in V1.  Recent Labs: 03/04/2018: ALT 22; BUN 13; Hemoglobin 15.8; Platelets 264; Potassium 3.4; Sodium 135 06/09/2018: Creatinine, Ser 1.00    Lipid Panel    Component Value Date/Time   CHOL 135 03/15/2018 1550   CHOL 167 05/08/2016 0803   TRIG 125 03/15/2018 1550   HDL 37 (L) 03/15/2018 1550   HDL 52 05/08/2016 0803   CHOLHDL 3.6 03/15/2018 1550   LDLCALC 77 03/15/2018 1550      Wt Readings from Last 3 Encounters:  10/25/18 255 lb (115.7 kg)  06/21/18 254 lb 8 oz (115.4 kg)  06/09/18 260 lb (117.9 kg)        ASSESSMENT AND PLAN:  1.  Coronary artery disease involving native coronary arteries with other forms of angina:  The patient has suspected endothelial dysfunction. Cardiac catheterization twice showed no evidence of obstructive coronary artery disease.    His symptoms are well controlled on antianginal medications.    2. Hyperlipidemia:  Continue treatment with high dose atorvastatin. Most recent LDL was 77 which is close to target.  3. Essential hypertension: Blood pressure is well controlled.  4.  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  Suspected neurologic etiology.   Disposition:   FU with me in 6 months  Signed,  [name], MD  10/25/2018 2:33 PM    [place]

## 2018-10-25 NOTE — Patient Instructions (Signed)

## 2018-10-27 DIAGNOSIS — M5412 Radiculopathy, cervical region: Secondary | ICD-10-CM | POA: Diagnosis not present

## 2018-10-27 DIAGNOSIS — M5030 Other cervical disc degeneration, unspecified cervical region: Secondary | ICD-10-CM | POA: Diagnosis not present

## 2018-11-19 ENCOUNTER — Other Ambulatory Visit: Payer: Self-pay | Admitting: Cardiovascular Disease

## 2018-11-19 ENCOUNTER — Other Ambulatory Visit: Payer: Self-pay | Admitting: Family Medicine

## 2018-11-19 DIAGNOSIS — I10 Essential (primary) hypertension: Secondary | ICD-10-CM

## 2018-11-21 DIAGNOSIS — M542 Cervicalgia: Secondary | ICD-10-CM | POA: Diagnosis not present

## 2018-11-21 DIAGNOSIS — R51 Headache: Secondary | ICD-10-CM | POA: Diagnosis not present

## 2018-11-21 DIAGNOSIS — M5412 Radiculopathy, cervical region: Secondary | ICD-10-CM | POA: Diagnosis not present

## 2018-11-21 DIAGNOSIS — R42 Dizziness and giddiness: Secondary | ICD-10-CM | POA: Diagnosis not present

## 2018-11-21 DIAGNOSIS — M5030 Other cervical disc degeneration, unspecified cervical region: Secondary | ICD-10-CM | POA: Diagnosis not present

## 2018-11-21 DIAGNOSIS — M62838 Other muscle spasm: Secondary | ICD-10-CM | POA: Diagnosis not present

## 2018-11-21 DIAGNOSIS — G3184 Mild cognitive impairment, so stated: Secondary | ICD-10-CM | POA: Diagnosis not present

## 2018-11-21 NOTE — Telephone Encounter (Signed)
appt scheduled with Raquel Sarna for 11.26.19 b/c Dr Ancil Boozer is booked until mid December

## 2018-11-21 NOTE — Telephone Encounter (Signed)
I have not seen him since June. Not sure if he is getting HCTZ or losartan HCTZ. Can you contact him. He also needs a follow up

## 2018-11-22 ENCOUNTER — Encounter: Payer: Self-pay | Admitting: Family Medicine

## 2018-11-22 ENCOUNTER — Ambulatory Visit: Payer: Medicaid Other | Admitting: Family Medicine

## 2018-11-22 VITALS — BP 124/64 | HR 82 | Temp 98.0°F | Resp 16 | Ht 71.0 in | Wt 261.3 lb

## 2018-11-22 DIAGNOSIS — E1149 Type 2 diabetes mellitus with other diabetic neurological complication: Secondary | ICD-10-CM

## 2018-11-22 DIAGNOSIS — R809 Proteinuria, unspecified: Secondary | ICD-10-CM

## 2018-11-22 DIAGNOSIS — I2583 Coronary atherosclerosis due to lipid rich plaque: Secondary | ICD-10-CM | POA: Diagnosis not present

## 2018-11-22 DIAGNOSIS — H9313 Tinnitus, bilateral: Secondary | ICD-10-CM | POA: Diagnosis not present

## 2018-11-22 DIAGNOSIS — E1129 Type 2 diabetes mellitus with other diabetic kidney complication: Secondary | ICD-10-CM

## 2018-11-22 DIAGNOSIS — I10 Essential (primary) hypertension: Secondary | ICD-10-CM | POA: Diagnosis not present

## 2018-11-22 DIAGNOSIS — D170 Benign lipomatous neoplasm of skin and subcutaneous tissue of head, face and neck: Secondary | ICD-10-CM

## 2018-11-22 DIAGNOSIS — I6529 Occlusion and stenosis of unspecified carotid artery: Secondary | ICD-10-CM | POA: Diagnosis not present

## 2018-11-22 DIAGNOSIS — I2510 Atherosclerotic heart disease of native coronary artery without angina pectoris: Secondary | ICD-10-CM

## 2018-11-22 NOTE — Progress Notes (Signed)
Name: [name] [name]   MRN: [id number]    DOB: [birth year]   Date:11/22/2018       Progress Note  Subjective  Chief Complaint  Chief Complaint  Patient presents with  . Medication Refill  . Hypertension    Headaches and dizziness  . Diabetes    Checks daily Average-115 Lowest-47  . Obesity  . Tinnitus    HPI  Tinnitus: he has a long history of tinnitus and hearing loss, saw ENT - has some hearing loss, was told there is nothing they can do about the ringing in his ears.   HTN: he is doing well on Losartan and HCTZ, and carvedilol.  he has not been taking Norvasc. Since bp went back to normal, headache resolved, except for the chronic neck pain, no recent chest pain, shortness of breath, palpitation, BLE edema. Seeing Dr. Fletcher Anon and Dr. Rockey Situ with cardiology.  CAD and Carotid Artery Stenosis: Seeing cardiology, no recent chest pain, taking Ranexa. He denies shortness of breath or palpitation, no vision changes. Taking atorvastatin 9m, no myalgias.   DMII: hgbA1C is at goal, denies polyphagia or polyuria. HgbA1C is due today - we will order.  Advised eye exam is needed.  Urine Micro is UTD.  On ARB, on statin therapy. Endorses polydipsia.  Morbid obesity: discussed again importance of life style modification for weight loss. He has multiple co-morbidities. Discussed healthy diet and regular exercise. He verbalizes understanding.  Facial Cyst: He notes that his neurologist told him he has a cyst above the LEFT eye; he notes this has been present for about 5 months and has been growing in size. HE would like referral back to his dermatologist. No vision changes, the area does cause some discomfort but no pain.  Patient Active Problem List   Diagnosis Date Noted  . Groin strain 03/30/2018  . Seizure ([place] 02/22/2018  . Mild cognitive impairment 11/17/2017  . Radiculopathy of cervical region 11/17/2017  . Stenosis of carotid artery 09/08/2017  . Cervical stenosis of spine  09/08/2017  . B12 deficiency 06/28/2017  . Headache disorder 02/15/2017  . Memory loss or impairment 02/15/2017  . Rectal bleeding 02/11/2017  . Coronary artery disease involving native coronary artery of native heart   . Radiculitis of left cervical region 03/05/2016  . Diabetic neuropathy associated with type 2 diabetes mellitus ([place] 12/05/2015  . Patellar subluxation 06/05/2015  . Allergic rhinitis, seasonal 06/05/2015  . Chronic constipation 06/05/2015  . Chronic lower back pain 06/05/2015  . Morbid obesity due to excess calories ([place] 06/05/2015  . Vitamin D deficiency 06/05/2015  . Central sleep apnea 06/05/2015  . 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 Nerve root pain 06/05/2015  . Acquired polycythemia 06/05/2015  . Anterior knee pain 06/05/2015  . Dysmetabolic syndrome 000/[id number] . Failure of erection 06/05/2015  . Gastro-esophageal reflux disease without esophagitis 06/05/2015  . Neuropathy 06/05/2015  . CAD (coronary artery disease)   . Angina, class III ([place] 05/23/2015  . Essential hypertension 05/23/2015  . Hyperlipidemia 05/23/2015  . Inguinal hernia without mention of obstruction or gangrene, recurrent unilateral or unspecified 03/24/2013    Past Surgical History:  Procedure Laterality Date  . BACK SURGERY  12/2008   X2-LUMBAR  . CARDIAC CATHETERIZATION N/A 05/30/2015   Procedure: Left Heart Cath;  Surgeon: MWellington Hampshire MD;  [facility];  Service: Cardiovascular;  Laterality: N/A;  . CARDIAC CATHETERIZATION N/A 04/16/2016   Procedure: Left Heart Cath and Coronary Angiography;  Surgeon: MRogue Jury  Ferne Reus, MD;  Location: [facility];  Service: Cardiovascular;  Laterality: N/A;  . COLONOSCOPY  2014   Dr. [name]  . COLONOSCOPY WITH PROPOFOL N/A 12/25/2016   Procedure: COLONOSCOPY WITH PROPOFOL;  Surgeon: Jonathon Bellows, MD;  Location: [facility];  Service: Endoscopy;  Laterality: N/A;  . COLONOSCOPY WITH PROPOFOL N/A 01/29/2017   Procedure:  COLONOSCOPY WITH PROPOFOL;  Surgeon: Jonathon Bellows, MD;  Location: [facility];  Service: Endoscopy;  Laterality: N/A;  . EVALUATION UNDER ANESTHESIA WITH HEMORRHOIDECTOMY N/A 02/24/2017   Procedure: EXAM UNDER ANESTHESIA WITH POSSIBLE EXCISION OF INTERNAL HEMORRHOIDS;  Surgeon: Olean Ree, MD;  Location: [facility];  Service: General;  Laterality: N/A;  . FISSURECTOMY  02/24/2017   Procedure: FISSURECTOMY;  Surgeon: Olean Ree, MD;  Location: [facility];  Service: General;;  . HERNIA REPAIR Right 1991  . INGUINAL HERNIA REPAIR Right 2012   Dr [name]  . INGUINAL HERNIA REPAIR Right 2014   Dr [name]    Family History  Problem Relation Age of Onset  . Heart Problems Mother   . Heart Problems Father 67       myocardial infarction  . Mental illness Brother     Social History   Socioeconomic History  . Marital status: Single    Spouse name: Not on file  . Number of children: Not on file  . Years of education: Not on file  . Highest education level: Not on file  Occupational History  . Not on file  Social Needs  . Financial resource strain: Not on file  . Food insecurity:    Worry: Not on file    Inability: Not on file  . Transportation needs:    Medical: Not on file    Non-medical: Not on file  Tobacco Use  . Smoking status: Former Smoker    Years: 2.00    Types: Cigars    Start date: 12/28/2002    Last attempt to quit: 06/04/2005    Years since quitting: 13.4  . Smokeless tobacco: Never Used  Substance and Sexual Activity  . Alcohol use: Yes    Alcohol/week: 1.0 standard drinks    Types: 1 Glasses of wine per week    Comment: R[employer]  . Drug use: No  . Sexual activity: Yes    Partners: Female  Lifestyle  . Physical activity:    Days per week: Not on file    Minutes per session: Not on file  . Stress: Not on file  Relationships  . Social connections:    Talks on phone: Not on file    Gets together: Not on file    Attends religious service: Not on file    Active  member of club or organization: Not on file    Attends meetings of clubs or organizations: Not on file    Relationship status: Not on file  . Intimate partner violence:    Fear of current or ex partner: Not on file    Emotionally abused: Not on file    Physically abused: Not on file    Forced sexual activity: Not on file  Other Topics Concern  . Not on file  Social History Narrative  . Not on file     Current Outpatient Medications:  .  ACCU-CHEK SMARTVIEW test strip, Check fsbs twice daily E11.9, Disp: 100 each, Rfl: 2 .  aspirin 81 MG tablet, Take 81 mg by mouth daily., Disp: , Rfl:  .  atorvastatin (LIPITOR) 80 MG tablet,  TAKE 1 TABLET BY MOUTH EVERY DAY, Disp: 90 tablet, Rfl: 0 .  carvedilol (COREG) 12.5 MG tablet, TAKE 1 TABLET BY MOUTH TWICE DAILY, Disp: 180 tablet, Rfl: 0 .  carvedilol (COREG) 12.5 MG tablet, TAKE 1 TABLET BY MOUTH TWICE DAILY, Disp: 60 tablet, Rfl: 2 .  cetirizine (ZYRTEC) 10 MG tablet, Take 10 mg by mouth daily as needed for allergies. , Disp: , Rfl: 1 .  Cyanocobalamin (B-12) 1000 MCG SUBL, Place 1 tablet under the tongue daily. (Patient taking differently: Place 1 tablet under the tongue every 30 (thirty) days. ), Disp: 30 each, Rfl: 0 .  diclofenac sodium (VOLTAREN) 1 % GEL, Apply 4 g topically 4 (four) times daily. (Patient taking differently: Apply 4 g topically 2 (two) times daily as needed. ), Disp: 100 g, Rfl: 2 .  dicyclomine (BENTYL) 10 MG capsule, TAKE 1 CAPSULE BY MOUTH THREE TIMES A DAY BEFORE MEALS, Disp: 90 capsule, Rfl: 0 .  FLECTOR 1.3 % PTCH, Place 1 patch onto the skin daily as needed (pain). , Disp: , Rfl: 0 .  fluticasone (FLONASE) 50 MCG/ACT nasal spray, Place 2 sprays into the nose daily as needed for allergies or rhinitis. , Disp: , Rfl:  .  hydrochlorothiazide (HYDRODIURIL) 12.5 MG tablet, TAKE 1 TABLET(12.5 MG) BY MOUTH DAILY, Disp: 90 tablet, Rfl: 0 .  Lancets (ACCU-CHEK SOFT TOUCH) lancets, Use as instructed, Disp: 100 each, Rfl: 12 .   Lancets Misc. (ACCU-CHEK FASTCLIX LANCET) KIT, Use as instructed., Disp: 1 kit, Rfl: 12 .  losartan-hydrochlorothiazide (HYZAAR) 100-12.5 MG tablet, Take 1 tablet by mouth daily., Disp: 90 tablet, Rfl: 3 .  metFORMIN (GLUCOPHAGE) 850 MG tablet, TAKE 1 TABLET(850 MG) BY MOUTH TWICE DAILY WITH A MEAL, Disp: 180 tablet, Rfl: 0 .  NITROSTAT 0.4 MG SL tablet, place 1 tablet under the tongue every 5 minutes for UP TO 3 doses if needed for angina as directed by prescriber, Disp: 25 tablet, Rfl: 1 .  nortriptyline (PAMELOR) 10 MG capsule, take 1 capsule at bedtime daily, Disp: , Rfl: 1 .  omeprazole (PRILOSEC) 20 MG capsule, Take 1 capsule (20 mg total) by mouth every morning., Disp: 30 capsule, Rfl: 5 .  ranolazine (RANEXA) 1000 MG SR tablet, Take 1 tablet (1,000 mg total) by mouth 2 (two) times daily., Disp: 180 tablet, Rfl: 0 .  ranolazine (RANEXA) 1000 MG SR tablet, TAKE 1 TABLET BY MOUTH TWICE DAILY, Disp: 60 tablet, Rfl: 2 .  selenium sulfide (SELSUN) 2.5 % shampoo, Apply 1 application topically daily as needed for irritation., Disp: 118 mL, Rfl: 12 .  sildenafil (REVATIO) 20 MG tablet, 1 TO 2 TABLETS BY MOUTH EVERY DAY, Disp: , Rfl: 0 .  venlafaxine (EFFEXOR) 75 MG tablet, Take 1 tablet by mouth daily., Disp: , Rfl: 3 .  isosorbide mononitrate (IMDUR) 30 MG 24 hr tablet, Take 1 tablet (30 mg total) by mouth daily., Disp: 90 tablet, Rfl: 3  Allergies  Allergen Reactions  . Gabapentin Other (See Comments)    Groggy-Mood Changes  . Latex Rash  . Penicillins Hives and Rash    Has patient had a PCN reaction causing immediate rash, facial/tongue/throat swelling, SOB or lightheadedness with hypotension: Yes Has patient had a PCN reaction causing severe rash involving mucus membranes or skin necrosis: No Has patient had a PCN reaction that required hospitalization No Has patient had a PCN reaction occurring within the last 10 years: No If all of the above answers are "NO", then may proceed with  Cephalosporin use.     I personally reviewed active problem list, medication list, allergies, health maintenance, notes from last encounter, lab results with the patient/caregiver today.   ROS  Constitutional: Negative for fever; positive for weight change.  Respiratory: Negative for cough and shortness of breath.   Cardiovascular: Negative for chest pain or palpitations.  Gastrointestinal: Negative for abdominal pain, no bowel changes.  Musculoskeletal: Negative for gait problem or joint swelling.  Skin: Negative for rash.  Neurological: Negative for dizziness or headache.  No other specific complaints in a complete review of systems (except as listed in HPI above).  Objective  Vitals:   11/22/18 1315  BP: 124/64  Pulse: 82  Resp: 16  Temp: 98 F (36.7 C)  TempSrc: Oral  SpO2: 99%  Weight: 261 lb 4.8 oz (118.5 kg)  Height: '5\' 11"'  (1.803 m)   Body mass index is 36.44 kg/m.  Physical Exam Constitutional: Patient appears well-developed and well-nourished. No distress.  HENT: Head: Normocephalic and atraumatic. Ears: bilateral TMs with no erythema or effusion; Nose: Nose normal. Mouth/Throat: Oropharynx is clear and moist. No oropharyngeal exudate or tonsillar swelling.   Eyes: Conjunctivae and EOM are normal. No scleral icterus. Neck: Normal range of motion. Neck supple. No JVD present. No thyromegaly present.  Cardiovascular: Normal rate, regular rhythm and normal heart sounds.  No murmur heard. No BLE edema. Pulmonary/Chest: Effort normal and breath sounds normal. No respiratory distress. Musculoskeletal: Normal range of motion, no joint effusions. No gross deformities Neurological: Pt is alert and oriented to person, place, and time. No cranial nerve deficit. Coordination, balance, strength, speech and gait are normal.  Skin: Skin is warm and dry. No rash noted. No erythema. Palpable fatty tumor just above the LEFT eyebrow - area is non-tender,  non-erythematous. Psychiatric: Patient has a normal mood and affect. behavior is normal. Judgment and thought content normal.  No results found for this or any previous visit (from the past 72 hour(s)).  PHQ2/9: Depression screen [facility] 2/9 11/22/2018 06/09/2018 06/28/2017 03/10/2017 11/26/2016  Decreased Interest 0 0 0 0 0  Down, Depressed, Hopeless 0 0 0 0 0  PHQ - 2 Score 0 0 0 0 0   Fall Risk: Fall Risk  11/22/2018 06/09/2018 03/15/2018 06/28/2017 03/10/2017  Falls in the past year? 0 No Yes No No  Number falls in past yr: 0 - 2 or more - -  Injury with Fall? - - Yes - -  Comment - - - - -  Follow up - - Falls evaluation completed - -   Functional Status Survey: Is the patient deaf or have difficulty hearing?: No Does the patient have difficulty seeing, even when wearing glasses/contacts?: No Does the patient have difficulty concentrating, remembering, or making decisions?: No Does the patient have difficulty walking or climbing stairs?: No Does the patient have difficulty dressing or bathing?: No Does the patient have difficulty doing errands alone such as visiting a doctor's office or shopping?: No  Assessment & Plan  1. Lipoma of face - Ambulatory referral to Dermatology  2. Essential hypertension - COMPLETE METABOLIC PANEL WITH GFR  3. Tinnitus of both ears - Keep follow up with ENT  4. Coronary artery disease due to lipid rich plaque - Keep f/u with Cardiology - Lipid panel  5. Stenosis of carotid artery, unspecified laterality - Keep f/u with Cardiology - Lipid panel  6. Other diabetic neurological complication associated with type 2 diabetes mellitus ([place]) - Keep follow up with neurology  7.  Controlled type 2 diabetes mellitus with microalbuminuria, without long-term current use of insulin (HCC) - Hemoglobin A1c - COMPLETE METABOLIC PANEL WITH GFR - Lipid panel  8. Morbid obesity due to excess calories (HCC) - Hemoglobin A1c - COMPLETE METABOLIC PANEL WITH  GFR - Discussed importance of 150 minutes of physical activity weekly, eat two servings of fish weekly, eat one serving of tree nuts ( cashews, pistachios, pecans, almonds.Marland Kitchen) every other day, eat 6 servings of fruit/vegetables daily and drink plenty of water and avoid sweet beverages.

## 2018-11-23 ENCOUNTER — Other Ambulatory Visit: Payer: Self-pay | Admitting: Family Medicine

## 2018-11-23 DIAGNOSIS — E782 Mixed hyperlipidemia: Secondary | ICD-10-CM

## 2018-11-23 LAB — COMPLETE METABOLIC PANEL WITH GFR
AG RATIO: 1.8 (calc) (ref 1.0–2.5)
ALT: 26 U/L (ref 9–46)
AST: 19 U/L (ref 10–35)
Albumin: 4.8 g/dL (ref 3.6–5.1)
Alkaline phosphatase (APISO): 80 U/L (ref 40–115)
BILIRUBIN TOTAL: 0.4 mg/dL (ref 0.2–1.2)
BUN: 15 mg/dL (ref 7–25)
CHLORIDE: 102 mmol/L (ref 98–110)
CO2: 27 mmol/L (ref 20–32)
Calcium: 10.3 mg/dL (ref 8.6–10.3)
Creat: 1.06 mg/dL (ref 0.70–1.33)
GFR, EST AFRICAN AMERICAN: 90 mL/min/{1.73_m2} (ref >=60)
GFR, Est Non African American: 78 mL/min/{1.73_m2} (ref >=60)
GLUCOSE: 136 mg/dL — AB (ref 65–99)
Globulin: 2.7 g/dL (calc) (ref 1.9–3.7)
POTASSIUM: 4.3 mmol/L (ref 3.5–5.3)
Sodium: 140 mmol/L (ref 135–146)
Total Protein: 7.5 g/dL (ref 6.1–8.1)

## 2018-11-23 LAB — LIPID PANEL
Cholesterol: 157 mg/dL (ref <200)
HDL: 47 mg/dL (ref >40)
LDL Cholesterol (Calc): 92 mg/dL (calc)
Non-HDL Cholesterol (Calc): 110 mg/dL (calc) (ref <130)
TRIGLYCERIDES: 87 mg/dL (ref <150)
Total CHOL/HDL Ratio: 3.3 (calc) (ref <5.0)

## 2018-11-23 LAB — HEMOGLOBIN A1C
EAG (MMOL/L): 8.1 (calc)
HEMOGLOBIN A1C: 6.7 %{Hb} — AB (ref <5.7)
MEAN PLASMA GLUCOSE: 146 (calc)

## 2018-11-23 MED ORDER — EZETIMIBE 10 MG PO TABS: 10.0000 mg | ORAL_TABLET | Freq: Every day | ORAL | 1 refills | Status: DC

## 2018-12-04 ENCOUNTER — Other Ambulatory Visit: Payer: Self-pay | Admitting: Family Medicine

## 2018-12-04 ENCOUNTER — Other Ambulatory Visit: Payer: Self-pay | Admitting: Cardiovascular Disease

## 2018-12-04 DIAGNOSIS — E782 Mixed hyperlipidemia: Secondary | ICD-10-CM

## 2018-12-05 DIAGNOSIS — L810 Postinflammatory hyperpigmentation: Secondary | ICD-10-CM | POA: Diagnosis not present

## 2018-12-05 DIAGNOSIS — L819 Disorder of pigmentation, unspecified: Secondary | ICD-10-CM | POA: Diagnosis not present

## 2018-12-05 DIAGNOSIS — D170 Benign lipomatous neoplasm of skin and subcutaneous tissue of head, face and neck: Secondary | ICD-10-CM | POA: Diagnosis not present

## 2018-12-05 DIAGNOSIS — E119 Type 2 diabetes mellitus without complications: Secondary | ICD-10-CM | POA: Diagnosis not present

## 2018-12-05 DIAGNOSIS — L811 Chloasma: Secondary | ICD-10-CM | POA: Diagnosis not present

## 2018-12-05 NOTE — Telephone Encounter (Signed)
Please review for refill.  

## 2018-12-06 NOTE — Telephone Encounter (Signed)
Patient seen by Dr. Fletcher Anon on 10/25/18. According to his medication list, he was taking:  1) Losartan/HCTZ 100/12.5 mg- once daily 2) HCTZ 12.5 mg- once daily  No medication changes made at that office visit and none since that I can see from cardiology. Please call the patient to confirm if he is taking losartan and HCTZ as above.   Thanks!

## 2018-12-08 NOTE — Telephone Encounter (Signed)
Spoke with [name] [name] regarding his refill of Losartan HCTZ 100/12.5 mg.  The patient stated Dr. Ancil Boozer has already filled and gave refills and is does not need a refill on any medications at this time.

## 2018-12-08 NOTE — Telephone Encounter (Signed)
Patient called back States that he does need a refill of losartan 100 MG

## 2018-12-13 ENCOUNTER — Encounter: Payer: Self-pay | Admitting: Family Medicine

## 2018-12-13 ENCOUNTER — Other Ambulatory Visit: Payer: Self-pay | Admitting: Family Medicine

## 2018-12-13 ENCOUNTER — Other Ambulatory Visit: Payer: Self-pay | Admitting: Cardiovascular Disease

## 2018-12-13 DIAGNOSIS — I10 Essential (primary) hypertension: Secondary | ICD-10-CM

## 2018-12-13 NOTE — Telephone Encounter (Signed)
Copied from [place] [phone]. Topic: Quick Communication - Rx Refill/Question >> Dec 13, 2018  2:57 PM [name], NT wrote: Medication: losartan-hydrochlorothiazide (HYZAAR) 100-12.5 MG tablet   Has the patient contacted their pharmacy? Yes.   (Agent: If no, request that the patient contact the pharmacy for the refill.) (Agent: If yes, when and what did the pharmacy advise?)   Pt states that his rx bottle stated the cardiologist fills this medicine for him, but the cardiologist states that Dr. Ancil Boozer does. Pt is down to 2 pills and does not know what to do or who should fill the medication and requesting the office to advise.   Preferred Pharmacy (with phone number or street name): [id number] - [name], [place] - [address] [phone] (Phone) [phone] (Fax)     Agent: Please be advised that RX refills may take up to 3 business days. We ask that you follow-up with your pharmacy.

## 2018-12-14 ENCOUNTER — Encounter: Payer: Self-pay | Admitting: Family Medicine

## 2018-12-14 ENCOUNTER — Other Ambulatory Visit: Payer: Self-pay | Admitting: Family Medicine

## 2018-12-14 NOTE — Telephone Encounter (Signed)
Patient had enough refills to last until February. Called pharmacy they said they overlooked it. Patient called and aware that he can go get his medication.

## 2018-12-15 ENCOUNTER — Ambulatory Visit: Payer: Self-pay | Admitting: *Deleted

## 2018-12-15 ENCOUNTER — Encounter: Payer: Self-pay | Admitting: Family Medicine

## 2018-12-15 NOTE — Telephone Encounter (Signed)
Patient has been experiencing bloody stools almost daily for about 4 weeks. Reports the blood is dark red and mixed in with stools as well as noticing stool and blood in his underwear between stools, sometimes from passing gas, other times he feels it leaking.Denies large clots in stools. Stated that over the last 2 weeks he will have several stools daily and notices blood in every stool. Has a bloated feeling near the umbilici he notices about QOD.Denies CP/SOB/dizziness.  Reports he changed his diet back in October to try to lose weight. Avoids fried foods and sweets but eats nuts, fruits and grilled meats. Does not report any pain with stools, occasional constipation. Denies any external hemorrhoids.   Had hemorrhoid surgery 2-3 years ago. Has had colonoscopy in the past and was under the care of GI but not any longer. Takes ASA daily.Reviewed symptoms he would need to seek immediate emergency evaluation for. Stated he will be in/out of town thru 12/24. Routing to PCP for possible appointment.  Reason for Disposition . MODERATE rectal bleeding (small blood clots, passing blood without stool, or toilet water turns red)  Answer Assessment - Initial Assessment Questions 1. APPEARANCE of BLOOD: "What color is it?" "Is it passed separately, on the surface of the stool, or mixed in with the stool?"      Dark red mixed in stool 2. AMOUNT: "How much blood was passed?"      Handful of blood and on the tissue 3. FREQUENCY: "How many times has blood been passed with the stools?"      Eating lots of fiber so going 3-4 times a day. 4. ONSET: "When was the blood first seen in the stools?" (Days or weeks)      4-5 weeks ago 5. DIARRHEA: "Is there also some diarrhea?" If so, ask: "How many diarrhea stools were passed in past 24 hours?"      When he has several stools a day, the stools get watery with some pieces 6. CONSTIPATION: "Do you have constipation?" If so, "How bad is it?"     Some days 7. RECURRENT  SYMPTOMS: "Have you had blood in your stools before?" If so, ask: "When was the last time?" and "What happened that time?"      Yes, he had hemorrhoid surgery 2-3 years ago at [facility]. 8. BLOOD THINNERS: "Do you take any blood thinners?" (e.g., Coumadin/warfarin, Pradaxa/dabigatran, aspirin)    Asa daily 9. OTHER SYMPTOMS: "Do you have any other symptoms?"  (e.g., abdominal pain, vomiting, dizziness, fever)     Bloated feeling in stomach Not constant just comes and goes.Recently changed his diet this past October. Occasionally pain near umbilici. 10. PREGNANCY: "Is there any chance you are pregnant?" "When was your last menstrual period?"       na  Protocols used: RECTAL BLEEDING-A-AH

## 2018-12-15 NOTE — Telephone Encounter (Signed)
Spoke with pt and he scheduled appt for 12.27.19 with Dr Ancil Boozer

## 2018-12-23 ENCOUNTER — Ambulatory Visit: Payer: Medicaid Other | Admitting: Family Medicine

## 2018-12-23 ENCOUNTER — Encounter: Payer: Self-pay | Admitting: Family Medicine

## 2018-12-23 VITALS — BP 140/100 | HR 99 | Temp 98.4°F | Resp 16 | Ht 71.0 in | Wt 261.4 lb

## 2018-12-23 DIAGNOSIS — M19011 Primary osteoarthritis, right shoulder: Secondary | ICD-10-CM | POA: Diagnosis not present

## 2018-12-23 DIAGNOSIS — K625 Hemorrhage of anus and rectum: Secondary | ICD-10-CM | POA: Diagnosis not present

## 2018-12-23 DIAGNOSIS — K640 First degree hemorrhoids: Secondary | ICD-10-CM | POA: Diagnosis not present

## 2018-12-23 DIAGNOSIS — I10 Essential (primary) hypertension: Secondary | ICD-10-CM | POA: Diagnosis not present

## 2018-12-23 DIAGNOSIS — R1033 Periumbilical pain: Secondary | ICD-10-CM

## 2018-12-23 LAB — CBC WITH DIFFERENTIAL/PLATELET
Absolute Monocytes: 931 cells/uL (ref 200–950)
BASOS ABS: 64 {cells}/uL (ref 0–200)
Basophils Relative: 0.6 %
EOS ABS: 150 {cells}/uL (ref 15–500)
Eosinophils Relative: 1.4 %
HCT: 44.8 % (ref 38.5–50.0)
Hemoglobin: 15.4 g/dL (ref 13.2–17.1)
Lymphs Abs: 3863 cells/uL (ref 850–3900)
MCH: 32.5 pg (ref 27.0–33.0)
MCHC: 34.4 g/dL (ref 32.0–36.0)
MCV: 94.5 fL (ref 80.0–100.0)
MPV: 10.7 fL (ref 7.5–12.5)
Monocytes Relative: 8.7 %
NEUTROS PCT: 53.2 %
Neutro Abs: 5692 cells/uL (ref 1500–7800)
Platelets: 317 10*3/uL (ref 140–400)
RBC: 4.74 10*6/uL (ref 4.20–5.80)
RDW: 12.7 % (ref 11.0–15.0)
TOTAL LYMPHOCYTE: 36.1 %
WBC: 10.7 10*3/uL (ref 3.8–10.8)

## 2018-12-23 MED ORDER — DICLOFENAC SODIUM 1 % TD GEL: 4.0000 g | Freq: Two times a day (BID) | TRANSDERMAL | 5 refills | Status: DC | PRN

## 2018-12-23 MED ORDER — FLECTOR 1.3 % TD PTCH: 1.0000 | MEDICATED_PATCH | Freq: Every day | TRANSDERMAL | 5 refills | Status: DC | PRN

## 2018-12-23 NOTE — Progress Notes (Signed)
Name: [name] [name]   MRN: [id number]    DOB: 01/06/1961   Date:12/23/2018       Progress Note  Subjective  Chief Complaint  Chief Complaint  Patient presents with  . Abdominal Pain  . Blood In Stools  . URI    HPI  Rectal bleeding and abdominal pain: he has a history of internal hemorrhoids and previous surgery. Last colonoscopy 01/2017 showed grade I internal hemorrhoids. He has been taking fiber supplements and dulcolax to avoid straining but he can bleed even with diarrhea. He has intermittently bleeding . Not mixed in stools but in toilet bowel. He states bleeding is associated with periumbilical cramping and sometimes sharp. Episodes of bleeding about 3 times a week and going on for the past 2 months, however the cramping/abdominal pain can last 30 minutes to about 2 hours. No fever or chills. No weight loss. He is not taking nsaid's by mouth   Chronic right shoulder pain: not currently taking medication, and would like to resume patch and voltaren gel because pain is bothering him again and cannot take Nsaid's by mouth.   URI: some rhinorrhea and congestion, mild clear sputum, mild intermittent cough but not SOB, he states very mild and will take care of it himself.    Patient Active Problem List   Diagnosis Date Noted  . Groin strain 03/30/2018  . Seizure ([place]) 02/22/2018  . Mild cognitive impairment 11/17/2017  . Radiculopathy of cervical region 11/17/2017  . Stenosis of carotid artery 09/08/2017  . Cervical stenosis of spine 09/08/2017  . B12 deficiency 06/28/2017  . Headache disorder 02/15/2017  . Memory loss or impairment 02/15/2017  . Rectal bleeding 02/11/2017  . Coronary artery disease involving native coronary artery of native heart   . Radiculitis of left cervical region 03/05/2016  . Diabetic neuropathy associated with type 2 diabetes mellitus ([place]) 12/05/2015  . Patellar subluxation 06/05/2015  . Allergic rhinitis, seasonal 06/05/2015  . Chronic constipation  06/05/2015  . Chronic lower back pain 06/05/2015  . Morbid obesity due to excess calories ([place]) 06/05/2015  . Vitamin D deficiency 06/05/2015  . Central sleep apnea 06/05/2015  . 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 Nerve root pain 06/05/2015  . Acquired polycythemia 06/05/2015  . Anterior knee pain 06/05/2015  . Dysmetabolic syndrome 89/38/1017  . Failure of erection 06/05/2015  . Gastro-esophageal reflux disease without esophagitis 06/05/2015  . Neuropathy 06/05/2015  . CAD (coronary artery disease)   . Angina, class III ([place]) 05/23/2015  . Essential hypertension 05/23/2015  . Hyperlipidemia 05/23/2015  . Inguinal hernia without mention of obstruction or gangrene, recurrent unilateral or unspecified 03/24/2013    Past Surgical History:  Procedure Laterality Date  . BACK SURGERY  12/2008   X2-LUMBAR  . CARDIAC CATHETERIZATION N/A 05/30/2015   Procedure: Left Heart Cath;  Surgeon: Wellington Hampshire, MD;  Location: [facility];  Service: Cardiovascular;  Laterality: N/A;  . CARDIAC CATHETERIZATION N/A 04/16/2016   Procedure: Left Heart Cath and Coronary Angiography;  Surgeon: Wellington Hampshire, MD;  Location: [facility];  Service: Cardiovascular;  Laterality: N/A;  . COLONOSCOPY  2014   Dr. [name]  . COLONOSCOPY WITH PROPOFOL N/A 12/25/2016   Procedure: COLONOSCOPY WITH PROPOFOL;  Surgeon: Jonathon Bellows, MD;  Location: [facility];  Service: Endoscopy;  Laterality: N/A;  . COLONOSCOPY WITH PROPOFOL N/A 01/29/2017   Procedure: COLONOSCOPY WITH PROPOFOL;  Surgeon: Jonathon Bellows, MD;  Location: [facility];  Service: Endoscopy;  Laterality: N/A;  .  EVALUATION UNDER ANESTHESIA WITH HEMORRHOIDECTOMY N/A 02/24/2017   Procedure: EXAM UNDER ANESTHESIA WITH POSSIBLE EXCISION OF INTERNAL HEMORRHOIDS;  Surgeon: Olean Ree, MD;  Location: [facility];  Service: General;  Laterality: N/A;  . FISSURECTOMY  02/24/2017   Procedure: FISSURECTOMY;  Surgeon: Olean Ree, MD;  Location: [facility];   Service: General;;  . HERNIA REPAIR Right 1991  . INGUINAL HERNIA REPAIR Right 2012   Dr [name]  . INGUINAL HERNIA REPAIR Right 2014   Dr [name]    Family History  Problem Relation Age of Onset  . Heart Problems Mother   . Heart Problems Father 17       myocardial infarction  . Mental illness Brother     Social History   Socioeconomic History  . Marital status: Single    Spouse name: Not on file  . Number of children: Not on file  . Years of education: Not on file  . Highest education level: Not on file  Occupational History  . Not on file  Social Needs  . Financial resource strain: Not on file  . Food insecurity:    Worry: Not on file    Inability: Not on file  . Transportation needs:    Medical: Not on file    Non-medical: Not on file  Tobacco Use  . Smoking status: Former Smoker    Years: 2.00    Types: Cigars    Start date: 12/28/2002    Last attempt to quit: 06/04/2005    Years since quitting: 13.5  . Smokeless tobacco: Never Used  Substance and Sexual Activity  . Alcohol use: Yes    Alcohol/week: 1.0 standard drinks    Types: 1 Glasses of wine per week    Comment: RARE [employer]  . Drug use: No  . Sexual activity: Yes    Partners: Female  Lifestyle  . Physical activity:    Days per week: Not on file    Minutes per session: Not on file  . Stress: Not on file  Relationships  . Social connections:    Talks on phone: Not on file    Gets together: Not on file    Attends religious service: Not on file    Active member of club or organization: Not on file    Attends meetings of clubs or organizations: Not on file    Relationship status: Not on file  . Intimate partner violence:    Fear of current or ex partner: Not on file    Emotionally abused: Not on file    Physically abused: Not on file    Forced sexual activity: Not on file  Other Topics Concern  . Not on file  Social History Narrative  . Not on file     Current Outpatient Medications:  .   ACCU-CHEK SMARTVIEW test strip, Check fsbs twice daily E11.9, Disp: 100 each, Rfl: 2 .  aspirin 81 MG tablet, Take 81 mg by mouth daily., Disp: , Rfl:  .  atorvastatin (LIPITOR) 80 MG tablet, TAKE 1 TABLET BY MOUTH EVERY DAY, Disp: 90 tablet, Rfl: 0 .  carvedilol (COREG) 12.5 MG tablet, TAKE 1 TABLET BY MOUTH TWICE DAILY, Disp: 60 tablet, Rfl: 2 .  cetirizine (ZYRTEC) 10 MG tablet, Take 10 mg by mouth daily as needed for allergies. , Disp: , Rfl: 1 .  Cyanocobalamin (B-12) 1000 MCG SUBL, Place 1 tablet under the tongue daily. (Patient taking differently: Place 1 tablet under the tongue every 30 (thirty) days. ), Disp:  30 each, Rfl: 0 .  diclofenac sodium (VOLTAREN) 1 % GEL, Apply 4 g topically 2 (two) times daily as needed., Disp: 100 g, Rfl: 5 .  dicyclomine (BENTYL) 10 MG capsule, TAKE 1 CAPSULE BY MOUTH THREE TIMES A DAY BEFORE MEALS, Disp: 90 capsule, Rfl: 0 .  ezetimibe (ZETIA) 10 MG tablet, Take 1 tablet (10 mg total) by mouth daily., Disp: 90 tablet, Rfl: 1 .  FLECTOR 1.3 % PTCH, Place 1 patch onto the skin daily as needed (pain)., Disp: 30 patch, Rfl: 5 .  fluticasone (FLONASE) 50 MCG/ACT nasal spray, Place 2 sprays into the nose daily as needed for allergies or rhinitis. , Disp: , Rfl:  .  Lancets (ACCU-CHEK SOFT TOUCH) lancets, Use as instructed, Disp: 100 each, Rfl: 12 .  Lancets Misc. (ACCU-CHEK FASTCLIX LANCET) KIT, Use as instructed., Disp: 1 kit, Rfl: 12 .  losartan-hydrochlorothiazide (HYZAAR) 100-12.5 MG tablet, Take 1 tablet by mouth daily., Disp: 90 tablet, Rfl: 3 .  metFORMIN (GLUCOPHAGE) 850 MG tablet, TAKE 1 TABLET(850 MG) BY MOUTH TWICE DAILY WITH A MEAL, Disp: 180 tablet, Rfl: 0 .  NITROSTAT 0.4 MG SL tablet, place 1 tablet under the tongue every 5 minutes for UP TO 3 doses if needed for angina as directed by prescriber, Disp: 25 tablet, Rfl: 1 .  nortriptyline (PAMELOR) 10 MG capsule, take 1 capsule at bedtime daily, Disp: , Rfl: 1 .  omeprazole (PRILOSEC) 20 MG capsule,  Take 1 capsule (20 mg total) by mouth every morning., Disp: 30 capsule, Rfl: 5 .  ranolazine (RANEXA) 1000 MG SR tablet, TAKE 1 TABLET BY MOUTH TWICE DAILY, Disp: 60 tablet, Rfl: 2 .  selenium sulfide (SELSUN) 2.5 % shampoo, Apply 1 application topically daily as needed for irritation., Disp: 118 mL, Rfl: 12 .  sildenafil (REVATIO) 20 MG tablet, 1 TO 2 TABLETS BY MOUTH EVERY DAY, Disp: , Rfl: 0 .  venlafaxine (EFFEXOR) 75 MG tablet, Take 1 tablet by mouth daily., Disp: , Rfl: 3 .  isosorbide mononitrate (IMDUR) 30 MG 24 hr tablet, Take 1 tablet (30 mg total) by mouth daily., Disp: 90 tablet, Rfl: 3  Allergies  Allergen Reactions  . Gabapentin Other (See Comments)    Groggy-Mood Changes  . Latex Rash  . Penicillins Hives and Rash    Has patient had a PCN reaction causing immediate rash, facial/tongue/throat swelling, SOB or lightheadedness with hypotension: Yes Has patient had a PCN reaction causing severe rash involving mucus membranes or skin necrosis: No Has patient had a PCN reaction that required hospitalization No Has patient had a PCN reaction occurring within the last 10 years: No If all of the above answers are "NO", then may proceed with Cephalosporin use.     I personally reviewed active problem list, medication list, allergies, family history, social history with the patient/caregiver today.   ROS  Ten systems reviewed and is negative except as mentioned in HPI   Objective  Vitals:   12/23/18 1100  BP: (!) 140/100  Pulse: 99  Resp: 16  Temp: 98.4 F (36.9 C)  TempSrc: Oral  SpO2: 98%  Weight: 261 lb 6.4 oz (118.6 kg)  Height: '5\' 11"'  (1.803 m)    Body mass index is 36.46 kg/m.  Physical Exam  Constitutional: Patient appears well-developed and well-nourished. Obese  No distress.  HEENT: head atraumatic, normocephalic, pupils equal and reactive to light,  neck supple, throat within normal limits Cardiovascular: Normal rate, regular rhythm and normal heart  sounds.  No murmur  heard. No BLE edema. Pulmonary/Chest: Effort normal and breath sounds normal. No respiratory distress. Abdominal: Soft.  There is epigastric tenderness. Normal bowel sounds.  Muscular Skeletal: decrease rom of right shoulder Psychiatric: Patient has a normal mood and affect. behavior is normal. Judgment and thought content normal.  Recent Results (from the past 2160 hour(s))  Hemoglobin A1c     Status: Abnormal   Collection Time: 11/22/18  2:01 PM  Result Value Ref Range   Hgb A1c MFr Bld 6.7 (H) <5.7 % of total Hgb    Comment: For someone without known diabetes, a hemoglobin A1c value of 6.5% or greater indicates that they may have  diabetes and this should be confirmed with a follow-up  test. . For someone with known diabetes, a value <7% indicates  that their diabetes is well controlled and a value  greater than or equal to 7% indicates suboptimal  control. A1c targets should be individualized based on  duration of diabetes, age, comorbid conditions, and  other considerations. . Currently, no consensus exists regarding use of hemoglobin A1c for diagnosis of diabetes for children. .    Mean Plasma Glucose 146 (calc)   eAG (mmol/L) 8.1 (calc)  COMPLETE METABOLIC PANEL WITH GFR     Status: Abnormal   Collection Time: 11/22/18  2:01 PM  Result Value Ref Range   Glucose, Bld 136 (H) 65 - 99 mg/dL    Comment: .            Fasting reference interval . For someone without known diabetes, a glucose value >125 mg/dL indicates that they may have diabetes and this should be confirmed with a follow-up test. .    BUN 15 7 - 25 mg/dL   Creat 1.06 0.70 - 1.33 mg/dL    Comment: For patients >64 years of age, the reference limit for Creatinine is approximately 13% higher for people identified as African-American. .    GFR, Est Non African American 78 > OR = 60 mL/min/1.11m   GFR, Est African American 90 > OR = 60 mL/min/1.767m  BUN/Creatinine Ratio NOT  APPLICABLE 6 - 22 (calc)   Sodium 140 135 - 146 mmol/L   Potassium 4.3 3.5 - 5.3 mmol/L   Chloride 102 98 - 110 mmol/L   CO2 27 20 - 32 mmol/L   Calcium 10.3 8.6 - 10.3 mg/dL   Total Protein 7.5 6.1 - 8.1 g/dL   Albumin 4.8 3.6 - 5.1 g/dL   Globulin 2.7 1.9 - 3.7 g/dL (calc)   AG Ratio 1.8 1.0 - 2.5 (calc)   Total Bilirubin 0.4 0.2 - 1.2 mg/dL   Alkaline phosphatase (APISO) 80 40 - 115 U/L   AST 19 10 - 35 U/L   ALT 26 9 - 46 U/L  Lipid panel     Status: None   Collection Time: 11/22/18  2:01 PM  Result Value Ref Range   Cholesterol 157 <200 mg/dL   HDL 47 >40 mg/dL   Triglycerides 87 <150 mg/dL   LDL Cholesterol (Calc) 92 mg/dL (calc)    Comment: Reference range: <100 . Desirable range <100 mg/dL for primary prevention;   <70 mg/dL for patients with CHD or diabetic patients  with > or = 2 CHD risk factors. . Marland KitchenDL-C is now calculated using the Martin-Hopkins  calculation, which is a validated novel method providing  better accuracy than the Friedewald equation in the  estimation of LDL-C.  MaCresenciano Genret al. JAAnnamaria [phone]  (http://education.[facility].com/faq/FAQ164)  Total CHOL/HDL Ratio 3.3 <5.0 (calc)   Non-HDL Cholesterol (Calc) 110 <130 mg/dL (calc)    Comment: For patients with diabetes plus 1 major ASCVD risk  factor, treating to a non-HDL-C goal of <100 mg/dL  (LDL-C of <70 mg/dL) is considered a therapeutic  option.      PHQ2/9: Depression screen [facility] 2/9 12/23/2018 11/22/2018 06/09/2018 06/28/2017 03/10/2017  Decreased Interest 0 0 0 0 0  Down, Depressed, Hopeless 0 0 0 0 0  PHQ - 2 Score 0 0 0 0 0    Fall Risk: Fall Risk  12/23/2018 11/22/2018 06/09/2018 03/15/2018 06/28/2017  Falls in the past year? 0 0 No Yes No  Number falls in past yr: 0 0 - 2 or more -  Injury with Fall? 1 - - Yes -  Comment - - - - -  Follow up - - - Falls evaluation completed -     Assessment & Plan   1. Bright red rectal bleeding  - CBC with  Differential/Platelet - Ambulatory referral to Gastroenterology  2. Grade I internal hemorrhoids  - CBC with Differential/Platelet - Ambulatory referral to Gastroenterology  3. Intermittent periumbilical abdominal pain  - Ambulatory referral to Gastroenterology  4. Essential hypertension  Usually at goal, but elevated today, he wants to monitor it for now  5. Osteoarthritis of right shoulder, unspecified osteoarthritis type  - diclofenac sodium (VOLTAREN) 1 % GEL; Apply 4 g topically 2 (two) times daily as needed.  Dispense: 100 g; Refill: 5 - FLECTOR 1.3 % PTCH; Place 1 patch onto the skin daily as needed (pain).  Dispense: 30 patch; Refill: 5

## 2018-12-28 HISTORY — PX: HAND SURGERY: SHX662

## 2018-12-30 ENCOUNTER — Other Ambulatory Visit: Payer: Self-pay

## 2018-12-30 DIAGNOSIS — M19011 Primary osteoarthritis, right shoulder: Secondary | ICD-10-CM

## 2018-12-30 MED ORDER — DICLOFENAC SODIUM 1 % TD GEL: 4.0000 g | Freq: Two times a day (BID) | TRANSDERMAL | 5 refills | Status: DC | PRN

## 2018-12-30 NOTE — Telephone Encounter (Signed)
Diclofenac gel has been approved from 12/29/2017-01/28/2019. This has to be done each month prior approval only last 30 days. I also had a prior auth for Flector patch. This will not be approved until Diclofenac get is tired and failed.

## 2019-01-11 ENCOUNTER — Ambulatory Visit: Payer: Medicaid Other | Admitting: Family Medicine

## 2019-01-17 DIAGNOSIS — D485 Neoplasm of uncertain behavior of skin: Secondary | ICD-10-CM | POA: Diagnosis not present

## 2019-01-17 DIAGNOSIS — D170 Benign lipomatous neoplasm of skin and subcutaneous tissue of head, face and neck: Secondary | ICD-10-CM | POA: Diagnosis not present

## 2019-01-18 ENCOUNTER — Encounter: Payer: Self-pay | Admitting: Gastroenterology

## 2019-01-18 ENCOUNTER — Other Ambulatory Visit: Payer: Self-pay

## 2019-01-18 ENCOUNTER — Ambulatory Visit (INDEPENDENT_AMBULATORY_CARE_PROVIDER_SITE_OTHER): Payer: Medicaid Other | Admitting: Gastroenterology

## 2019-01-18 VITALS — BP 132/87 | HR 81 | Resp 17 | Ht 71.0 in | Wt 251.0 lb

## 2019-01-18 DIAGNOSIS — R140 Abdominal distension (gaseous): Secondary | ICD-10-CM | POA: Diagnosis not present

## 2019-01-18 DIAGNOSIS — R1013 Epigastric pain: Secondary | ICD-10-CM | POA: Diagnosis not present

## 2019-01-18 DIAGNOSIS — R634 Abnormal weight loss: Secondary | ICD-10-CM

## 2019-01-18 DIAGNOSIS — K529 Noninfective gastroenteritis and colitis, unspecified: Secondary | ICD-10-CM | POA: Diagnosis not present

## 2019-01-18 NOTE — Progress Notes (Signed)
[name], MD [address]  [place]  [place],  [address]  Main: [phone]  Fax: [phone]    Gastroenterology Consultation  Referring Provider:     Steele Sizer, MD Primary Care Physician:  Steele Sizer, MD Primary Gastroenterologist:  Dr. Vicente Males Reason for Consultation: Rectal bleeding          HPI:   DEONDRA WIGGER is a 58 y.o. African-American male referred by Dr. Steele Sizer, MD  for consultation & management of rectal bleeding.  Patient was seen by Dr. Vicente Males for rectal bleeding in the past.  And, he was found to have large hemorrhoids.  He subsequently underwent left lateral hemorrhoidectomy by Dr. Hampton Abbot in 03/2017. Patient reports that he has been noticing recurrence of bright red bleeding per rectum, dripping and on wiping.  He reports alternating episodes of 1 week diarrhea followed by constipation for which he takes stool softeners.  He reports upper abdominal pain associated with significant abdominal bloating.  He generally has lower abdominal discomfort.  He used to drink alcohol now replaced with sodas and Gatorade in last 3 to 4 weeks.  He lost about 10 pounds in last 1 month which he is worried about.  He is accompanied by his wife today.  He does not smoke cigarettes.  His hemoglobin is normal. CT in 02/2018 with normal hepato-pancreatic or biliary pathology  NSAIDs: None  Antiplts/Anticoagulants/Anti thrombotics: None  GI Procedures: Colonoscopy in 2017 and 2018 by Dr. Vicente Males - Preparation of the colon was poor. - Stool in the sigmoid colon. - No specimens collected.  - Three 5 to 7 mm polyps in the transverse colon, removed with a cold snare. Resected and retrieved. - One 3 mm polyp in the ascending colon, removed with a cold biopsy forceps. Resected and retrieved. - Non-bleeding internal hemorrhoids. - The examination was otherwise normal on direct and retroflexion views.  DIAGNOSIS:  A. COLON POLYP, ASCENDING; COLD BIOPSY:  -  TUBULAR ADENOMA.  - NEGATIVE FOR HIGH-GRADE DYSPLASIA AND MALIGNANCY.   B. COLON POLYP 2, TRANSVERSE; COLD SNARE:  - TUBULAR ADENOMAS (2), NEGATIVE FOR HIGH-GRADE DYSPLASIA AND  MALIGNANCY.  - POLYPOID COLONIC MUCOSA WITH HEMORRHAGE OF THE LAMINA PROPRIA (1).    Past Medical History:  Diagnosis Date  . Allergy   . Angina, class III ([place])   . Chronic constipation   . Degeneration of intervertebral disc of cervical region   . Diabetes mellitus without complication ([place])   . Diastolic dysfunction    a. 06/2017 Echo: EF 60-65%, no rwma, Gr1 DD.  Marland Kitchen Dyspnea   . GERD (gastroesophageal reflux disease)   . Hernia 1991   02/10/2012-RIH repair  . Hyperlipidemia   . Hypertension 2008  . Nerve root pain   . Neuropathy   . Non-obstructive CAD (coronary artery disease)    a. 05/30/2015 cath: LM nl, mLAD 50% (FFR 0.83), LCx minor irregs, RCA minor irregs, EF 55-65%-->Med Rx; b. 03/2016 Cath: LM nl LAD 40m D1/2/3 min irregs, LCX min irregs, OM1/2/3 nl, RCA min irregs, RPDA/RPAV/RPL1/RPL2 nl-->Med Rx.  . Obesity, unspecified 2012  . Personal history of tobacco use, presenting hazards to health 2012  . Polycythemia   . Recurrent Right Inguinal Hernia Repair 02/09/2011, 04/07/2013   [facility] . Sleep apnea    a. On CPAP;  b. 06/2017 Echo: no PAH.  .Marland KitchenUmbilical hernia without mention of obstruction or gangrene 02/09/2011   [facility] . Vitamin D deficiency  Past Surgical History:  Procedure Laterality Date  . BACK SURGERY  12/2008   X2-LUMBAR  . CARDIAC CATHETERIZATION N/A 05/30/2015   Procedure: Left Heart Cath;  Surgeon: Wellington Hampshire, MD;  Location: [place] CV LAB;  Service: Cardiovascular;  Laterality: N/A;  . CARDIAC CATHETERIZATION N/A 04/16/2016   Procedure: Left Heart Cath and Coronary Angiography;  Surgeon: Wellington Hampshire, MD;  Location: [place] CV LAB;  Service: Cardiovascular;  Laterality: N/A;  . COLONOSCOPY  2014   Dr. [name]  . COLONOSCOPY WITH  PROPOFOL N/A 12/25/2016   Procedure: COLONOSCOPY WITH PROPOFOL;  Surgeon: Jonathon Bellows, MD;  Location: [facility] ENDOSCOPY;  Service: Endoscopy;  Laterality: N/A;  . COLONOSCOPY WITH PROPOFOL N/A 01/29/2017   Procedure: COLONOSCOPY WITH PROPOFOL;  Surgeon: Jonathon Bellows, MD;  Location: [facility] ENDOSCOPY;  Service: Endoscopy;  Laterality: N/A;  . EVALUATION UNDER ANESTHESIA WITH HEMORRHOIDECTOMY N/A 02/24/2017   Procedure: EXAM UNDER ANESTHESIA WITH POSSIBLE EXCISION OF INTERNAL HEMORRHOIDS;  Surgeon: Olean Ree, MD;  Location: [facility] ORS;  Service: General;  Laterality: N/A;  . FISSURECTOMY  02/24/2017   Procedure: FISSURECTOMY;  Surgeon: Olean Ree, MD;  Location: [facility] ORS;  Service: General;;  . HERNIA REPAIR Right 1991  . INGUINAL HERNIA REPAIR Right 2012   Dr [name]  . INGUINAL HERNIA REPAIR Right 2014   Dr [name]     Current Outpatient Medications:  .  ACCU-CHEK SMARTVIEW test strip, Check fsbs twice daily E11.9, Disp: 100 each, Rfl: 2 .  aspirin 81 MG tablet, Take 81 mg by mouth daily., Disp: , Rfl:  .  carvedilol (COREG) 12.5 MG tablet, TAKE 1 TABLET BY MOUTH TWICE DAILY, Disp: 60 tablet, Rfl: 2 .  cetirizine (ZYRTEC) 10 MG tablet, Take 10 mg by mouth daily as needed for allergies. , Disp: , Rfl: 1 .  Cyanocobalamin (B-12) 1000 MCG SUBL, Place 1 tablet under the tongue daily. (Patient taking differently: Place 1 tablet under the tongue every 30 (thirty) days. ), Disp: 30 each, Rfl: 0 .  diclofenac sodium (VOLTAREN) 1 % GEL, Apply 4 g topically 2 (two) times daily as needed., Disp: 100 g, Rfl: 5 .  dicyclomine (BENTYL) 10 MG capsule, TAKE 1 CAPSULE BY MOUTH THREE TIMES A DAY BEFORE MEALS, Disp: 90 capsule, Rfl: 0 .  ezetimibe (ZETIA) 10 MG tablet, Take 1 tablet (10 mg total) by mouth daily., Disp: 90 tablet, Rfl: 1 .  FLECTOR 1.3 % PTCH, Place 1 patch onto the skin daily as needed (pain)., Disp: 30 patch, Rfl: 5 .  fluticasone (FLONASE) 50 MCG/ACT nasal spray, Place 2 sprays into the nose daily as  needed for allergies or rhinitis. , Disp: , Rfl:  .  Lancets (ACCU-CHEK SOFT TOUCH) lancets, Use as instructed, Disp: 100 each, Rfl: 12 .  Lancets Misc. (ACCU-CHEK FASTCLIX LANCET) KIT, Use as instructed., Disp: 1 kit, Rfl: 12 .  losartan-hydrochlorothiazide (HYZAAR) 100-12.5 MG tablet, Take 1 tablet by mouth daily., Disp: 90 tablet, Rfl: 3 .  metFORMIN (GLUCOPHAGE) 850 MG tablet, TAKE 1 TABLET(850 MG) BY MOUTH TWICE DAILY WITH A MEAL, Disp: 180 tablet, Rfl: 0 .  NITROSTAT 0.4 MG SL tablet, place 1 tablet under the tongue every 5 minutes for UP TO 3 doses if needed for angina as directed by prescriber, Disp: 25 tablet, Rfl: 1 .  nortriptyline (PAMELOR) 10 MG capsule, take 1 capsule at bedtime daily, Disp: , Rfl: 1 .  omeprazole (PRILOSEC) 20 MG capsule, Take 1 capsule (20 mg total) by mouth every morning., Disp:  30 capsule, Rfl: 5 .  ranolazine (RANEXA) 1000 MG SR tablet, TAKE 1 TABLET BY MOUTH TWICE DAILY, Disp: 60 tablet, Rfl: 2 .  selenium sulfide (SELSUN) 2.5 % shampoo, Apply 1 application topically daily as needed for irritation., Disp: 118 mL, Rfl: 12 .  sildenafil (REVATIO) 20 MG tablet, 1 TO 2 TABLETS BY MOUTH EVERY DAY, Disp: , Rfl: 0 .  venlafaxine (EFFEXOR) 75 MG tablet, Take 1 tablet by mouth daily., Disp: , Rfl: 3 .  atorvastatin (LIPITOR) 80 MG tablet, TAKE 1 TABLET BY MOUTH EVERY DAY (Patient not taking: Reported on 01/18/2019), Disp: 90 tablet, Rfl: 0 .  isosorbide mononitrate (IMDUR) 30 MG 24 hr tablet, Take 1 tablet (30 mg total) by mouth daily., Disp: 90 tablet, Rfl: 3   Family History  Problem Relation Age of Onset  . Heart Problems Mother   . Heart Problems Father 18       myocardial infarction  . Mental illness Brother      Social History   Tobacco Use  . Smoking status: Former Smoker    Years: 2.00    Types: Cigars    Start date: 12/28/2002    Last attempt to quit: 06/04/2005    Years since quitting: 13.6  . Smokeless tobacco: Never Used  Substance Use Topics  .  Alcohol use: Yes    Alcohol/week: 1.0 standard drinks    Types: 1 Glasses of wine per week    Comment: RARE BEER  . Drug use: No    Allergies as of 01/18/2019 - Review Complete 01/18/2019  Allergen Reaction Noted  . Gabapentin Other (See Comments) 04/16/2015  . Latex Rash 06/20/2015  . Penicillins Hives and Rash 03/23/2013    Review of Systems:    All systems reviewed and negative except where noted in HPI.   Physical Exam:  BP 132/87 (BP Location: Left Arm, Patient Position: Sitting, Cuff Size: Large)   Pulse 81   Resp 17   Ht '5\' 11"'  (1.803 m)   Wt 251 lb (113.9 kg)   BMI 35.01 kg/m  No LMP for male patient.  General:   Alert,  Well-developed, well-nourished, pleasant and cooperative in NAD Head:  Normocephalic and atraumatic. Eyes:  Sclera clear, no icterus.   Conjunctiva pink. Ears:  Normal auditory acuity. Nose:  No deformity, discharge, or lesions. Mouth:  No deformity or lesions,oropharynx pink & moist. Neck:  Supple; no masses or thyromegaly. Lungs:  Respirations even and unlabored.  Clear throughout to auscultation.   No wheezes, crackles, or rhonchi. No acute distress. Heart:  Regular rate and rhythm; no murmurs, clicks, rubs, or gallops. Abdomen:  Normal bowel sounds. Soft, non-tender and non-distended without masses, hepatosplenomegaly or hernias noted.  No guarding or rebound tenderness.   Rectal: Not performed Msk:  Symmetrical without gross deformities. Good, equal movement & strength bilaterally. Pulses:  Normal pulses noted. Extremities:  No clubbing or edema.  No cyanosis. Neurologic:  Alert and oriented x3;  grossly normal neurologically. Skin:  Intact without significant lesions or rashes. No jaundice. Psych:  Alert and cooperative. Normal mood and affect.  Imaging Studies: Reviewed  Assessment and Plan:   UMAR PATMON is a 58 y.o. African-American male with metabolic syndrome seen in consultation for 2 to 4 weeks of upper abdominal pain  associated with significant bloating, weight loss, rectal bleeding and alternating episodes of constipation and diarrhea.  Part of his symptoms are probably secondary to intake of carbonated beverages.  However, recommend to evaluate for  peptic ulcer disease, malignancy, H. pylori infection Schedule EGD with biopsies Strongly advised him to avoid all carbonated beverages, artificial sweeteners  Rectal bleeding Discussed with him that his rectal bleeding is secondary to hemorrhoids can be treated with hemorrhoid ligation Patient would like to wait until EGD is performed  Tubular adenomas of colon Recommend surveillance colonoscopy in 01/2020   Follow up in 4 weeks   [name], MD

## 2019-01-23 ENCOUNTER — Encounter: Payer: Self-pay | Admitting: *Deleted

## 2019-01-24 ENCOUNTER — Encounter
Admission: RE | Disposition: A | Discharge status: Home / Self Care | Payer: Self-pay | Source: Home / Self Care | Attending: Gastroenterology

## 2019-01-24 ENCOUNTER — Encounter: Payer: Self-pay | Admitting: Certified Registered"

## 2019-01-24 ENCOUNTER — Encounter: Payer: Self-pay | Admitting: Emergency Medicine

## 2019-01-24 ENCOUNTER — Other Ambulatory Visit: Payer: Self-pay

## 2019-01-24 ENCOUNTER — Inpatient Hospital Stay
Admission: EM | Admit: 2019-01-24 | Discharge: 2019-01-27 | DRG: 638 | Disposition: A | Discharge status: Home / Self Care | Payer: Medicaid Other | Attending: Internal Medicine | Admitting: Internal Medicine

## 2019-01-24 ENCOUNTER — Ambulatory Visit
Admission: RE | Admit: 2019-01-24 | Discharge: 2019-01-24 | Disposition: A | Discharge status: Home / Self Care | Payer: Medicaid Other | Source: Home / Self Care | Attending: Gastroenterology | Admitting: Gastroenterology

## 2019-01-24 DIAGNOSIS — Z87891 Personal history of nicotine dependence: Secondary | ICD-10-CM | POA: Diagnosis not present

## 2019-01-24 DIAGNOSIS — E1110 Type 2 diabetes mellitus with ketoacidosis without coma: Secondary | ICD-10-CM

## 2019-01-24 DIAGNOSIS — Z7982 Long term (current) use of aspirin: Secondary | ICD-10-CM | POA: Diagnosis not present

## 2019-01-24 DIAGNOSIS — Z818 Family history of other mental and behavioral disorders: Secondary | ICD-10-CM

## 2019-01-24 DIAGNOSIS — R1013 Epigastric pain: Secondary | ICD-10-CM | POA: Diagnosis present

## 2019-01-24 DIAGNOSIS — G4733 Obstructive sleep apnea (adult) (pediatric): Secondary | ICD-10-CM | POA: Diagnosis present

## 2019-01-24 DIAGNOSIS — Z91040 Latex allergy status: Secondary | ICD-10-CM

## 2019-01-24 DIAGNOSIS — Z5309 Procedure and treatment not carried out because of other contraindication: Secondary | ICD-10-CM

## 2019-01-24 DIAGNOSIS — N179 Acute kidney failure, unspecified: Secondary | ICD-10-CM | POA: Diagnosis not present

## 2019-01-24 DIAGNOSIS — E1165 Type 2 diabetes mellitus with hyperglycemia: Secondary | ICD-10-CM | POA: Diagnosis not present

## 2019-01-24 DIAGNOSIS — Z8249 Family history of ischemic heart disease and other diseases of the circulatory system: Secondary | ICD-10-CM

## 2019-01-24 DIAGNOSIS — E860 Dehydration: Secondary | ICD-10-CM | POA: Diagnosis present

## 2019-01-24 DIAGNOSIS — R112 Nausea with vomiting, unspecified: Secondary | ICD-10-CM | POA: Diagnosis not present

## 2019-01-24 DIAGNOSIS — E669 Obesity, unspecified: Secondary | ICD-10-CM | POA: Diagnosis present

## 2019-01-24 DIAGNOSIS — I2510 Atherosclerotic heart disease of native coronary artery without angina pectoris: Secondary | ICD-10-CM | POA: Diagnosis present

## 2019-01-24 DIAGNOSIS — I10 Essential (primary) hypertension: Secondary | ICD-10-CM | POA: Diagnosis not present

## 2019-01-24 DIAGNOSIS — M1990 Unspecified osteoarthritis, unspecified site: Secondary | ICD-10-CM | POA: Diagnosis present

## 2019-01-24 DIAGNOSIS — E1100 Type 2 diabetes mellitus with hyperosmolarity without nonketotic hyperglycemic-hyperosmolar coma (NKHHC): Principal | ICD-10-CM | POA: Diagnosis present

## 2019-01-24 DIAGNOSIS — Z6835 Body mass index (BMI) 35.0-35.9, adult: Secondary | ICD-10-CM | POA: Diagnosis not present

## 2019-01-24 DIAGNOSIS — Z79899 Other long term (current) drug therapy: Secondary | ICD-10-CM | POA: Diagnosis not present

## 2019-01-24 DIAGNOSIS — R140 Abdominal distension (gaseous): Secondary | ICD-10-CM | POA: Insufficient documentation

## 2019-01-24 DIAGNOSIS — R739 Hyperglycemia, unspecified: Secondary | ICD-10-CM | POA: Diagnosis present

## 2019-01-24 DIAGNOSIS — Z7984 Long term (current) use of oral hypoglycemic drugs: Secondary | ICD-10-CM | POA: Diagnosis not present

## 2019-01-24 DIAGNOSIS — Z888 Allergy status to other drugs, medicaments and biological substances status: Secondary | ICD-10-CM | POA: Diagnosis not present

## 2019-01-24 DIAGNOSIS — E559 Vitamin D deficiency, unspecified: Secondary | ICD-10-CM | POA: Diagnosis present

## 2019-01-24 DIAGNOSIS — G4730 Sleep apnea, unspecified: Secondary | ICD-10-CM | POA: Diagnosis not present

## 2019-01-24 DIAGNOSIS — K219 Gastro-esophageal reflux disease without esophagitis: Secondary | ICD-10-CM | POA: Diagnosis present

## 2019-01-24 DIAGNOSIS — Z880 Allergy status to penicillin: Secondary | ICD-10-CM | POA: Diagnosis not present

## 2019-01-24 DIAGNOSIS — E119 Type 2 diabetes mellitus without complications: Secondary | ICD-10-CM | POA: Diagnosis not present

## 2019-01-24 DIAGNOSIS — E871 Hypo-osmolality and hyponatremia: Secondary | ICD-10-CM | POA: Diagnosis not present

## 2019-01-24 DIAGNOSIS — K921 Melena: Secondary | ICD-10-CM | POA: Diagnosis not present

## 2019-01-24 DIAGNOSIS — E785 Hyperlipidemia, unspecified: Secondary | ICD-10-CM | POA: Diagnosis present

## 2019-01-24 DIAGNOSIS — K2960 Other gastritis without bleeding: Secondary | ICD-10-CM | POA: Diagnosis not present

## 2019-01-24 LAB — BASIC METABOLIC PANEL
ANION GAP: 14 (ref 5–15)
Anion gap: 9 (ref 5–15)
Anion gap: 7 (ref 5–15)
BUN: 29 mg/dL — ABNORMAL HIGH (ref 6–20)
BUN: 28 mg/dL — ABNORMAL HIGH (ref 6–20)
BUN: 32 mg/dL — ABNORMAL HIGH (ref 6–20)
CALCIUM: 9.2 mg/dL (ref 8.9–10.3)
CALCIUM: 8.1 mg/dL — AB (ref 8.9–10.3)
CALCIUM: 8.1 mg/dL — AB (ref 8.9–10.3)
CO2: 18 mmol/L — AB (ref 22–32)
CO2: 20 mmol/L — AB (ref 22–32)
CO2: 25 mmol/L (ref 22–32)
CREATININE: 2.18 mg/dL — AB (ref 0.61–1.24)
Chloride: 90 mmol/L — ABNORMAL LOW (ref 98–111)
Chloride: 98 mmol/L (ref 98–111)
Chloride: 100 mmol/L (ref 98–111)
Creatinine, Ser: 2.59 mg/dL — ABNORMAL HIGH (ref 0.61–1.24)
Creatinine, Ser: 2.18 mg/dL — ABNORMAL HIGH (ref 0.61–1.24)
GFR calc Af Amer: 38 mL/min — ABNORMAL LOW (ref >60)
GFR calc Af Amer: 38 mL/min — ABNORMAL LOW (ref >60)
GFR calc non Af Amer: 33 mL/min — ABNORMAL LOW (ref >60)
GFR calc non Af Amer: 33 mL/min — ABNORMAL LOW (ref >60)
GFR, EST AFRICAN AMERICAN: 31 mL/min — AB (ref >60)
GFR, EST NON AFRICAN AMERICAN: 27 mL/min — AB (ref >60)
Glucose, Bld: 706 mg/dL (ref 70–99)
Glucose, Bld: 440 mg/dL — ABNORMAL HIGH (ref 70–99)
Glucose, Bld: 301 mg/dL — ABNORMAL HIGH (ref 70–99)
POTASSIUM: 5.1 mmol/L (ref 3.5–5.1)
Potassium: 4.4 mmol/L (ref 3.5–5.1)
Potassium: 4 mmol/L (ref 3.5–5.1)
Sodium: 122 mmol/L — ABNORMAL LOW (ref 135–145)
Sodium: 127 mmol/L — ABNORMAL LOW (ref 135–145)
Sodium: 132 mmol/L — ABNORMAL LOW (ref 135–145)

## 2019-01-24 LAB — CBC
HCT: 44.4 % (ref 39.0–52.0)
Hemoglobin: 15.7 g/dL (ref 13.0–17.0)
MCH: 31.7 pg (ref 26.0–34.0)
MCHC: 35.4 g/dL (ref 30.0–36.0)
MCV: 89.7 fL (ref 80.0–100.0)
Platelets: 280 10*3/uL (ref 150–400)
RBC: 4.95 MIL/uL (ref 4.22–5.81)
RDW: 11.9 % (ref 11.5–15.5)
WBC: 12.3 10*3/uL — ABNORMAL HIGH (ref 4.0–10.5)
nRBC: 0 % (ref 0.0–0.2)

## 2019-01-24 LAB — GLUCOSE, CAPILLARY
Glucose-Capillary: 250 mg/dL — ABNORMAL HIGH (ref 70–99)
Glucose-Capillary: 364 mg/dL — ABNORMAL HIGH (ref 70–99)
Glucose-Capillary: 381 mg/dL — ABNORMAL HIGH (ref 70–99)
Glucose-Capillary: 418 mg/dL — ABNORMAL HIGH (ref 70–99)
Glucose-Capillary: 425 mg/dL — ABNORMAL HIGH (ref 70–99)
Glucose-Capillary: 428 mg/dL — ABNORMAL HIGH (ref 70–99)

## 2019-01-24 LAB — BETA-HYDROXYBUTYRIC ACID: Beta-Hydroxybutyric Acid: 0.34 mmol/L — ABNORMAL HIGH (ref 0.05–0.27)

## 2019-01-24 LAB — HEMOGLOBIN AND HEMATOCRIT, BLOOD
HCT: 37.7 % — ABNORMAL LOW (ref 39.0–52.0)
Hemoglobin: 12.9 g/dL — ABNORMAL LOW (ref 13.0–17.0)

## 2019-01-24 LAB — MRSA PCR SCREENING: MRSA by PCR: NEGATIVE

## 2019-01-24 SURGERY — ESOPHAGOGASTRODUODENOSCOPY (EGD) WITH PROPOFOL
Anesthesia: General

## 2019-01-24 MED ORDER — PANTOPRAZOLE SODIUM 40 MG IV SOLR
40.0000 mg | Freq: Two times a day (BID) | INTRAVENOUS | Status: DC
  Administered 2019-01-24 – 2019-01-27 (×6): 40 mg via INTRAVENOUS
  Filled 2019-01-24 (×6): qty 40

## 2019-01-24 MED ORDER — ONDANSETRON HCL 4 MG PO TABS: 4.0000 mg | ORAL_TABLET | Freq: Four times a day (QID) | ORAL | Status: DC | PRN

## 2019-01-24 MED ORDER — PROPOFOL 10 MG/ML IV BOLUS
INTRAVENOUS | Status: AC
  Filled 2019-01-24: qty 20

## 2019-01-24 MED ORDER — INSULIN ASPART 100 UNIT/ML ~~LOC~~ SOLN
10.0000 [IU] | Freq: Once | SUBCUTANEOUS | Status: AC
  Administered 2019-01-24: 10 [IU] via INTRAVENOUS
  Filled 2019-01-24: qty 0.1

## 2019-01-24 MED ORDER — SODIUM CHLORIDE 0.9 % IV SOLN: INTRAVENOUS | Status: DC

## 2019-01-24 MED ORDER — INSULIN GLARGINE 100 UNIT/ML ~~LOC~~ SOLN
10.0000 [IU] | Freq: Every day | SUBCUTANEOUS | Status: DC
  Administered 2019-01-24: 10 [IU] via SUBCUTANEOUS
  Filled 2019-01-24 (×2): qty 0.1

## 2019-01-24 MED ORDER — INSULIN REGULAR(HUMAN) IN NACL 100-0.9 UT/100ML-% IV SOLN: INTRAVENOUS | Status: DC

## 2019-01-24 MED ORDER — INSULIN ASPART 100 UNIT/ML ~~LOC~~ SOLN: 0.0000 [IU] | SUBCUTANEOUS | Status: DC

## 2019-01-24 MED ORDER — DEXTROSE-NACL 5-0.45 % IV SOLN: INTRAVENOUS | Status: DC

## 2019-01-24 MED ORDER — SODIUM CHLORIDE 0.9 % IV BOLUS
1000.0000 mL | Freq: Once | INTRAVENOUS | Status: AC
  Administered 2019-01-24: 1000 mL via INTRAVENOUS

## 2019-01-24 MED ORDER — DOCUSATE SODIUM 100 MG PO CAPS
100.0000 mg | ORAL_CAPSULE | Freq: Two times a day (BID) | ORAL | Status: DC
  Administered 2019-01-24 – 2019-01-27 (×5): 100 mg via ORAL
  Filled 2019-01-24 (×5): qty 1

## 2019-01-24 MED ORDER — INSULIN ASPART 100 UNIT/ML ~~LOC~~ SOLN: 10.0000 [IU] | Freq: Once | SUBCUTANEOUS | Status: DC

## 2019-01-24 MED ORDER — SODIUM CHLORIDE 0.9 % IV BOLUS: 1000.0000 mL | Freq: Once | INTRAVENOUS | Status: DC

## 2019-01-24 MED ORDER — ENOXAPARIN SODIUM 40 MG/0.4ML ~~LOC~~ SOLN
40.0000 mg | SUBCUTANEOUS | Status: DC
  Administered 2019-01-24 – 2019-01-26 (×3): 40 mg via SUBCUTANEOUS
  Filled 2019-01-24 (×3): qty 0.4

## 2019-01-24 MED ORDER — PROPOFOL 10 MG/ML IV BOLUS
INTRAVENOUS | Status: AC
  Filled 2019-01-24: qty 40

## 2019-01-24 MED ORDER — TRAMADOL HCL 50 MG PO TABS
50.0000 mg | ORAL_TABLET | Freq: Four times a day (QID) | ORAL | Status: DC | PRN
  Administered 2019-01-25 – 2019-01-26 (×2): 50 mg via ORAL
  Filled 2019-01-24 (×2): qty 1

## 2019-01-24 MED ORDER — INSULIN REGULAR(HUMAN) IN NACL 100-0.9 UT/100ML-% IV SOLN
INTRAVENOUS | Status: DC
  Administered 2019-01-24: 3.6 [IU]/h via INTRAVENOUS
  Filled 2019-01-24: qty 100

## 2019-01-24 MED ORDER — SODIUM CHLORIDE 0.9 % IV SOLN
INTRAVENOUS | Status: DC
  Administered 2019-01-24: 18:00:00 via INTRAVENOUS

## 2019-01-24 MED ORDER — INSULIN ASPART 100 UNIT/ML ~~LOC~~ SOLN
SUBCUTANEOUS | Status: AC
  Administered 2019-01-24: 10 [IU] via INTRAVENOUS
  Filled 2019-01-24: qty 1

## 2019-01-24 MED ORDER — SODIUM CHLORIDE 0.9 % IV SOLN
1000.0000 mL | Freq: Once | INTRAVENOUS | Status: AC
  Administered 2019-01-24: 1000 mL via INTRAVENOUS

## 2019-01-24 MED ORDER — ONDANSETRON HCL 4 MG/2ML IJ SOLN: 4.0000 mg | Freq: Four times a day (QID) | INTRAMUSCULAR | Status: DC | PRN

## 2019-01-24 NOTE — H&P (Signed)
[place] [facility] [place] NAME: [name] [name]    MR#:  [id number]  DATE OF BIRTH:  07/31/1961  DATE OF ADMISSION:  01/24/2019  PRIMARY CARE PHYSICIAN: Steele Sizer, MD   REQUESTING/REFERRING PHYSICIAN: Dr. Corky Downs  CHIEF COMPLAINT:   High sugar HISTORY OF PRESENT ILLNESS:  [name] [name]  is a 58 y.o. male with a known history of GERD and recent history of GI bleed, hypertension hyperlipidemia, non-insulin requiring diabetes mellitus.  Nonobstructive coronary artery disease, obesity came for outpatient EGD but unfortunately the procedure got canceled as his blood sugar was at 700 and patient is sent over to the emergency department.  Patient denies any chest pain or shortness of breath.  Mom at bedside.  Patient was given 1 L fluid bolus and repeat Accu-Chek was greater than 600.  Hospitalist team is called to admit the patient patient is started on IV fluid boluses and insulin drip.  Patient reports that he is very thirsty and feeling dry with frequent urination.  Denies any chest pain or shortness of breath  PAST MEDICAL HISTORY:   Past Medical History:  Diagnosis Date  . Allergy   . Angina, class III ([place])   . Chronic constipation   . Degeneration of intervertebral disc of cervical region   . Diabetes mellitus without complication ([place])   . Diastolic dysfunction    a. 06/2017 Echo: EF 60-65%, no rwma, Gr1 DD.  Marland Kitchen Dyspnea   . GERD (gastroesophageal reflux disease)   . Hernia 1991   02/10/2012-RIH repair  . Hyperlipidemia   . Hypertension 2008  . Nerve root pain   . Neuropathy   . Non-obstructive CAD (coronary artery disease)    a. 05/30/2015 cath: LM nl, mLAD 50% (FFR 0.83), LCx minor irregs, RCA minor irregs, EF 55-65%-->Med Rx; b. 03/2016 Cath: LM nl LAD 65m D1/2/3 min irregs, LCX min irregs, OM1/2/3 nl, RCA min irregs, RPDA/RPAV/RPL1/RPL2 nl-->Med Rx.  . Obesity, unspecified 2012  . Personal history of tobacco use, presenting hazards  to health 2012  . Polycythemia   . Recurrent Right Inguinal Hernia Repair 02/09/2011, 04/07/2013   [facility] . Sleep apnea    a. On CPAP;  b. 06/2017 Echo: no PAH.  .Marland KitchenUmbilical hernia without mention of obstruction or gangrene 02/09/2011   [facility] . Vitamin D deficiency     PAST SURGICAL HISTOIRY:   Past Surgical History:  Procedure Laterality Date  . BACK SURGERY  12/2008   X2-LUMBAR  . CARDIAC CATHETERIZATION N/A 05/30/2015   Procedure: Left Heart Cath;  Surgeon: MWellington Hampshire MD;  [place];  Service: Cardiovascular;  Laterality: N/A;  . CARDIAC CATHETERIZATION N/A 04/16/2016   Procedure: Left Heart Cath and Coronary Angiography;  Surgeon: MWellington Hampshire MD;  [place] [facility];  Service: Cardiovascular;  Laterality: N/A;  . COLONOSCOPY  2014   Dr. SJamal Collin . COLONOSCOPY WITH PROPOFOL N/A 12/25/2016   Procedure: COLONOSCOPY WITH PROPOFOL;  Surgeon: KJonathon Bellows MD;  Location: [facility];  Service: Endoscopy;  Laterality: N/A;  . COLONOSCOPY WITH PROPOFOL N/A 01/29/2017   Procedure: COLONOSCOPY WITH PROPOFOL;  Surgeon: KJonathon Bellows MD;  Location: [facility] END[facility]COPY;  Service: Endoscopy;  Laterality: N/A;  . EVALUATION UNDER ANESTHESIA WITH HEMORRHOIDECTOMY N/A 02/24/2017   Procedure: EXAM UNDER ANESTHESIA WITH POSSIBLE EXCISION OF INTERNAL HEMORRHOIDS;  Surgeon: JOlean Ree MD;  Location: [facility] ORS;  Service: General;  Laterality: N/A;  . FISSURECTOMY  02/24/2017   Procedure: FISSURECTOMY;  Surgeon: Olean Ree, MD;  Location: [facility] ORS;  Service: General;;  . HERNIA REPAIR Right 1991  . INGUINAL HERNIA REPAIR Right 2012   Dr [name]  . INGUINAL HERNIA REPAIR Right 2014   Dr [name]    SOCIAL HISTORY:   Social History   Tobacco Use  . Smoking status: Former Smoker    Years: 2.00    Types: Cigars    Start date: 12/28/2002    Last attempt to quit: 06/04/2005    Years since quitting: 13.6  . Smokeless tobacco: Never Used   Substance Use Topics  . Alcohol use: Yes    Alcohol/week: 1.0 standard drinks    Types: 1 Glasses of wine per week    Comment: RARE BEER    FAMILY HISTORY:   Family History  Problem Relation Age of Onset  . Heart Problems Mother   . Heart Problems Father 80       myocardial infarction  . Mental illness Brother     DRUG ALLERGIES:   Allergies  Allergen Reactions  . Gabapentin Other (See Comments)    Groggy-Mood Changes  . Latex Rash  . Penicillins Hives and Rash    Has patient had a PCN reaction causing immediate rash, facial/tongue/throat swelling, SOB or lightheadedness with hypotension: Yes Has patient had a PCN reaction causing severe rash involving mucus membranes or skin necrosis: No Has patient had a PCN reaction that required hospitalization No Has patient had a PCN reaction occurring within the last 10 years: No If all of the above answers are "NO", then may proceed with Cephalosporin use.     REVIEW OF SYSTEMS:  CONSTITUTIONAL: No fever, reports feeling weak EYES: No blurred or double vision.  EARS, NOSE, AND THROAT: No tinnitus or ear pain.  RESPIRATORY: No cough, shortness of breath, wheezing or hemoptysis.  CARDIOVASCULAR: No chest pain, orthopnea, edema.  GASTROINTESTINAL: No nausea, vomiting, diarrhea or abdominal pain.  GENITOURINARY: No dysuria, hematuria.  ENDOCRINE: Reports polydipsia polyuria, nocturia,  HEMATOLOGY: No anemia, easy bruising or bleeding SKIN: No rash or lesion. MUSCULOSKELETAL: No joint pain or arthritis.   NEUROLOGIC: No tingling, numbness, weakness.  PSYCHIATRY: No anxiety or depression.   MEDICATIONS AT HOME:   Prior to Admission medications   Medication Sig Start Date End Date Taking? Authorizing Provider  ACCU-CHEK SMARTVIEW test strip Check fsbs twice daily E11.9 03/18/18   Steele Sizer, MD  aspirin 81 MG tablet Take 81 mg by mouth daily.    [provider]  atorvastatin (LIPITOR) 80 MG tablet TAKE 1 TABLET BY  MOUTH EVERY DAY 12/04/18   Ancil Boozer, Drue Stager, MD  carvedilol (COREG) 12.5 MG tablet TAKE 1 TABLET BY MOUTH TWICE DAILY 11/21/18   Minna Merritts, MD  cetirizine (ZYRTEC) 10 MG tablet Take 10 mg by mouth daily as needed for allergies.  07/09/16   [provider]  Cyanocobalamin (B-12) 1000 MCG SUBL Place 1 tablet under the tongue daily. Patient taking differently: Place 1 tablet under the tongue every 30 (thirty) days.  06/28/17   Steele Sizer, MD  diclofenac sodium (VOLTAREN) 1 % GEL Apply 4 g topically 2 (two) times daily as needed. 12/30/18   Steele Sizer, MD  dicyclomine (BENTYL) 10 MG capsule TAKE 1 CAPSULE BY MOUTH THREE TIMES A DAY BEFORE MEALS 04/27/18   Jonathon Bellows, MD  ezetimibe (ZETIA) 10 MG tablet Take 1 tablet (10 mg total) by mouth daily. 11/23/18   Hubbard Hartshorn, FNP  [facility]  1.3 % PTCH Place 1 patch onto the skin daily as needed (pain). 12/23/18   Steele Sizer, MD  fluticasone (FLONASE) 50 MCG/ACT nasal spray Place 2 sprays into the nose daily as needed for allergies or rhinitis.  03/05/15   [provider]  Lancets (ACCU-CHEK SOFT TOUCH) lancets Use as instructed 03/15/18   Steele Sizer, MD  Lancets Misc. (ACCU-CHEK FASTCLIX LANCET) KIT Use as instructed. 03/18/18   Steele Sizer, MD  losartan-hydrochlorothiazide (HYZAAR) 100-12.5 MG tablet Take 1 tablet by mouth daily. 06/21/18   Steele Sizer, MD  metFORMIN (GLUCOPHAGE) 850 MG tablet TAKE 1 TABLET(850 MG) BY MOUTH TWICE DAILY WITH A MEAL 07/31/18   Sowles, Drue Stager, MD  NITROSTAT 0.4 MG SL tablet place 1 tablet under the tongue every 5 minutes for UP TO 3 doses if needed for angina as directed by prescriber 01/15/17   Wellington Hampshire, MD  nortriptyline (PAMELOR) 10 MG capsule take 1 capsule at bedtime daily 02/17/17   [provider]  omeprazole (PRILOSEC) 20 MG capsule Take 1 capsule (20 mg total) by mouth every morning. 03/10/17   Steele Sizer, MD  ranolazine (RANEXA) 1000 MG SR tablet TAKE 1 TABLET  BY MOUTH TWICE DAILY 11/21/18   Minna Merritts, MD  selenium sulfide (SELSUN) 2.5 % shampoo Apply 1 application topically daily as needed for irritation. 06/21/18   Steele Sizer, MD  sildenafil (REVATIO) 20 MG tablet 1 TO 2 TABLETS BY MOUTH EVERY DAY 10/27/18   [provider]  venlafaxine (EFFEXOR) 75 MG tablet Take 1 tablet by mouth daily. 06/08/18   [provider]      VITAL SIGNS:  Blood pressure 119/62, pulse 72, temperature 98 F (36.7 C), temperature source Oral, resp. rate 16, height '5\' 11"'  (1.803 m), weight 109.3 kg, SpO2 98 %.  PHYSICAL EXAMINATION:  GENERAL:  58 y.o.-year-old patient lying in the bed with no acute distress.  EYES: Pupils equal, round, reactive to light and accommodation. No scleral icterus. Extraocular muscles intact.  HEENT: Head atraumatic, normocephalic. Oropharynx and nasopharynx clear.  NECK:  Supple, no jugular venous distention. No thyroid enlargement, no tenderness.  LUNGS: Normal breath sounds bilaterally, no wheezing, rales,rhonchi or crepitation. No use of accessory muscles of respiration.  CARDIOVASCULAR: S1, S2 normal. No murmurs, rubs, or gallops.  ABDOMEN: Soft, nontender, nondistended. Bowel sounds present. No organomegaly or mass.  EXTREMITIES: No pedal edema, cyanosis, or clubbing.  NEUROLOGIC: Awake, alert and oriented x3 sensation intact. Gait not checked.  PSYCHIATRIC: The patient is alert and oriented x 3.  SKIN: No obvious rash, lesion, or ulcer.   LABORATORY PANEL:   CBC Recent Labs  Lab 01/24/19 1459  WBC 12.3*  HGB 15.7  HCT 44.4  PLT 280   ------------------------------------------------------------------------------------------------------------------  Chemistries  Recent Labs  Lab 01/24/19 1358  NA 122*  K 5.1  CL 90*  CO2 18*  GLUCOSE 706*  BUN 29*  CREATININE 2.59*  CALCIUM 9.2    ------------------------------------------------------------------------------------------------------------------  Cardiac Enzymes No results for input(s): TROPONINI in the last 168 hours. ------------------------------------------------------------------------------------------------------------------  RADIOLOGY:  No results found.  EKG:   Orders placed or performed in visit on 10/25/18  . EKG 12-Lead    IMPRESSION AND PLAN:     #Hyperosmolar nonketotic hyperglycemia Admit patient to stepdown unit 10 units of IV regular insulin given Will start patient on insulin drip per protocol Serial BMPs Check hemoglobin A1c  #Pseudohyponatremia secondary to hyperglycemia Should be auto corrected with IV fluids and insulin drip  #History of  GI bleed Patient's endoscopy got canceled today because of the high sugars Will consult gastroenterology Dr. Bonna Gains, text message sent via haiku IV Protonix  #AKI secondary to dehydration from hyperglycemia Aggressive hydration with IV fluids and avoid nephrotoxins hold metformin  #Essential hypertension-currently blood pressure is stable hold antihypertensives  #Obstructive sleep apnea CPAP nightly  DVT prophylaxis with Lovenox subcu renal dose adjusted  All the records are reviewed and case discussed with ED provider. Management plans discussed with the patient, family and they are in agreement.  CODE STATUS: FC  TOTAL TIME TAKING CARE OF THIS PATIENT: 45 minutes.   Note: This dictation was prepared with Dragon dictation along with smaller phrase technology. Any transcriptional errors that result from this process are unintentional.  Nicholes Mango M.D on 01/24/2019 at 5:29 PM  Between 7am to 6pm - Pager - [phone]  After 6pm go to [email or web address] - password [email or web address]  [place] [facility]  Office  [phone]  CC: Primary care physician; Steele Sizer, MD

## 2019-01-24 NOTE — ED Provider Notes (Addendum)
[facility] Emergency Department Provider Note   ____________________________________________    I have reviewed the triage vital signs and the nursing notes.   HISTORY  Chief Complaint Hyperglycemia     HPI [name] [name] is a 58 y.o. male who presents with elevated glucose.  Patient sent down from endoscopy suite, scheduled for endoscopy but labs demonstrated significantly elevated glucose.  Patient reports polyuria and polydipsia over the last several days.  Reports compliance with his metformin.  Denies fevers or chills.  Has not been on steroids.  Past Medical History:  Diagnosis Date  . Allergy   . Angina, class III ([place])   . Chronic constipation   . Degeneration of intervertebral disc of cervical region   . Diabetes mellitus without complication ([place])   . Diastolic dysfunction    a. 06/2017 Echo: EF 60-65%, no rwma, Gr1 DD.  Marland Kitchen Dyspnea   . GERD (gastroesophageal reflux disease)   . Hernia 1991   02/10/2012-RIH repair  . Hyperlipidemia   . Hypertension 2008  . Nerve root pain   . Neuropathy   . Non-obstructive CAD (coronary artery disease)    a. 05/30/2015 cath: LM nl, mLAD 50% (FFR 0.83), LCx minor irregs, RCA minor irregs, EF 55-65%-->Med Rx; b. 03/2016 Cath: LM nl LAD 62m D1/2/3 min irregs, LCX min irregs, OM1/2/3 nl, RCA min irregs, RPDA/RPAV/RPL1/RPL2 nl-->Med Rx.  . Obesity, unspecified 2012  . Personal history of tobacco use, presenting hazards to health 2012  . Polycythemia   . Recurrent Right Inguinal Hernia Repair 02/09/2011, 04/07/2013   [facility] . Sleep apnea    a. On CPAP;  b. 06/2017 Echo: no PAH.  .Marland KitchenUmbilical hernia without mention of obstruction or gangrene 02/09/2011   [facility] . Vitamin D deficiency     Patient Active Problem List   Diagnosis Date Noted  . Groin strain 03/30/2018  . Seizure ([place] 02/22/2018  . Mild cognitive impairment 11/17/2017  . Radiculopathy of cervical region 11/17/2017  .  Stenosis of carotid artery 09/08/2017  . Cervical stenosis of spine 09/08/2017  . B12 deficiency 06/28/2017  . Headache disorder 02/15/2017  . Memory loss or impairment 02/15/2017  . Rectal bleeding 02/11/2017  . Coronary artery disease involving native coronary artery of native heart   . Radiculitis of left cervical region 03/05/2016  . Diabetic neuropathy associated with type 2 diabetes mellitus ([place] 12/05/2015  . Patellar subluxation 06/05/2015  . Allergic rhinitis, seasonal 06/05/2015  . Chronic constipation 06/05/2015  . Chronic lower back pain 06/05/2015  . Morbid obesity due to excess calories ([place] 06/05/2015  . Vitamin D deficiency 06/05/2015  . Central sleep apnea 06/05/2015  . 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 Nerve root pain 06/05/2015  . Acquired polycythemia 06/05/2015  . Anterior knee pain 06/05/2015  . Dysmetabolic syndrome 008/65/7846 . Failure of erection 06/05/2015  . Gastro-esophageal reflux disease without esophagitis 06/05/2015  . Neuropathy 06/05/2015  . CAD (coronary artery disease)   . Angina, class III ([place] 05/23/2015  . Essential hypertension 05/23/2015  . Hyperlipidemia 05/23/2015  . Inguinal hernia without mention of obstruction or gangrene, recurrent unilateral or unspecified 03/24/2013    Past Surgical History:  Procedure Laterality Date  . BACK SURGERY  12/2008   X2-LUMBAR  . CARDIAC CATHETERIZATION N/A 05/30/2015   Procedure: Left Heart Cath;  Surgeon: MWellington Hampshire MD;  [facility];  Service: Cardiovascular;  Laterality: N/A;  . CARDIAC CATHETERIZATION N/A 04/16/2016  Procedure: Left Heart Cath and Coronary Angiography;  Surgeon: Wellington Hampshire, MD;  Location: [place] [facility];  Service: Cardiovascular;  Laterality: N/A;  . COLONOSCOPY  2014   Dr. [name]  . COLONOSCOPY WITH PROPOFOL N/A 12/25/2016   Procedure: COLONOSCOPY WITH PROPOFOL;  Surgeon: Jonathon Bellows, MD;  Location: [facility] ENDOSCOPY;  Service: Endoscopy;   Laterality: N/A;  . COLONOSCOPY WITH PROPOFOL N/A 01/29/2017   Procedure: COLONOSCOPY WITH PROPOFOL;  Surgeon: Jonathon Bellows, MD;  Location: [facility] ENDOSCOPY;  Service: Endoscopy;  Laterality: N/A;  . EVALUATION UNDER ANESTHESIA WITH HEMORRHOIDECTOMY N/A 02/24/2017   Procedure: EXAM UNDER ANESTHESIA WITH POSSIBLE EXCISION OF INTERNAL HEMORRHOIDS;  Surgeon: Olean Ree, MD;  Location: [facility] ORS;  Service: General;  Laterality: N/A;  . FISSURECTOMY  02/24/2017   Procedure: FISSURECTOMY;  Surgeon: Olean Ree, MD;  Location: [facility] ORS;  Service: General;;  . HERNIA REPAIR Right 1991  . INGUINAL HERNIA REPAIR Right 2012   Dr [name]  . INGUINAL HERNIA REPAIR Right 2014   Dr [name]    Prior to Admission medications   Medication Sig Start Date End Date Taking? Authorizing Provider  ACCU-CHEK SMARTVIEW test strip Check fsbs twice daily E11.9 03/18/18   Steele Sizer, MD  aspirin 81 MG tablet Take 81 mg by mouth daily.    [provider]  atorvastatin (LIPITOR) 80 MG tablet TAKE 1 TABLET BY MOUTH EVERY DAY 12/04/18   Ancil Boozer, Drue Stager, MD  carvedilol (COREG) 12.5 MG tablet TAKE 1 TABLET BY MOUTH TWICE DAILY 11/21/18   Minna Merritts, MD  cetirizine (ZYRTEC) 10 MG tablet Take 10 mg by mouth daily as needed for allergies.  07/09/16   [provider]  Cyanocobalamin (B-12) 1000 MCG SUBL Place 1 tablet under the tongue daily. Patient taking differently: Place 1 tablet under the tongue every 30 (thirty) days.  06/28/17   Steele Sizer, MD  diclofenac sodium (VOLTAREN) 1 % GEL Apply 4 g topically 2 (two) times daily as needed. 12/30/18   Steele Sizer, MD  dicyclomine (BENTYL) 10 MG capsule TAKE 1 CAPSULE BY MOUTH THREE TIMES A DAY BEFORE MEALS 04/27/18   Jonathon Bellows, MD  ezetimibe (ZETIA) 10 MG tablet Take 1 tablet (10 mg total) by mouth daily. 11/23/18   Hubbard Hartshorn, FNP  FLECTOR 1.3 % PTCH Place 1 patch onto the skin daily as needed (pain). 12/23/18   Steele Sizer, MD  fluticasone  (FLONASE) 50 MCG/ACT nasal spray Place 2 sprays into the nose daily as needed for allergies or rhinitis.  03/05/15   [provider]  isosorbide mononitrate (IMDUR) 30 MG 24 hr tablet Take 1 tablet (30 mg total) by mouth daily. 08/24/17 11/22/17  Theora Gianotti, NP  Lancets (ACCU-CHEK SOFT [facility]) lancets Use as instructed 03/15/18   Steele Sizer, MD  Lancets Misc. (ACCU-CHEK FASTCLIX LANCET) KIT Use as instructed. 03/18/18   Steele Sizer, MD  losartan-hydrochlorothiazide (HYZAAR) 100-12.5 MG tablet Take 1 tablet by mouth daily. 06/21/18   Steele Sizer, MD  metFORMIN (GLUCOPHAGE) 850 MG tablet TAKE 1 TABLET(850 MG) BY MOUTH TWICE DAILY WITH A MEAL 07/31/18   Sowles, Drue Stager, MD  NITROSTAT 0.4 MG SL tablet place 1 tablet under the tongue every 5 minutes for UP TO 3 doses if needed for angina as directed by prescriber 01/15/17   Wellington Hampshire, MD  nortriptyline (PAMELOR) 10 MG capsule take 1 capsule at bedtime daily 02/17/17   [provider]  omeprazole (PRILOSEC) 20 MG capsule Take 1 capsule (20 mg  total) by mouth every morning. 03/10/17   Steele Sizer, MD  ranolazine (RANEXA) 1000 MG SR tablet TAKE 1 TABLET BY MOUTH TWICE DAILY 11/21/18   Minna Merritts, MD  selenium sulfide (SELSUN) 2.5 % shampoo Apply 1 application topically daily as needed for irritation. 06/21/18   Steele Sizer, MD  sildenafil (REVATIO) 20 MG tablet 1 TO 2 TABLETS BY MOUTH EVERY DAY 10/27/18   [provider]  venlafaxine (EFFEXOR) 75 MG tablet Take 1 tablet by mouth daily. 06/08/18   [provider]     Allergies Gabapentin; Latex; and Penicillins  Family History  Problem Relation Age of Onset  . Heart Problems Mother   . Heart Problems Father 96       myocardial infarction  . Mental illness Brother     Social History Social History   Tobacco Use  . Smoking status: Former Smoker    Years: 2.00    Types: Cigars    Start date: 12/28/2002    Last attempt to  quit: 06/04/2005    Years since quitting: 13.6  . Smokeless tobacco: Never Used  Substance Use Topics  . Alcohol use: Yes    Alcohol/week: 1.0 standard drinks    Types: 1 Glasses of wine per week    Comment: RARE BEER  . Drug use: No    Review of Systems  Constitutional: No fever/chills Eyes: No visual changes.  ENT: Feels dehydrated Cardiovascular: Denies chest pain. Respiratory: Denies shortness of breath. Gastrointestinal: No abdominal pain.  No nausea, no vomiting.   Genitourinary: Negative for dysuria.  Polyuria Musculoskeletal: Negative for back pain. Skin: Negative for rash. Neurological: Negative for headaches   ____________________________________________   PHYSICAL EXAM:  VITAL SIGNS: ED Triage Vitals  Enc Vitals Group     BP 01/24/19 1446 119/62     Pulse Rate 01/24/19 1446 72     Resp 01/24/19 1446 16     Temp 01/24/19 1446 98 F (36.7 C)     Temp Source 01/24/19 1446 Oral     SpO2 01/24/19 1446 98 %     Weight 01/24/19 1447 109.3 kg (240 lb 15.4 oz)     Height 01/24/19 1447 1.803 m (_0 )     Head Circumference --      Peak Flow --      Pain Score 01/24/19 1446 7     Pain Loc --      Pain Edu? --      Excl. in [place]? --     Constitutional: Alert and oriented.  Eyes: Conjunctivae are normal.   Nose: No congestion/rhinnorhea. Mouth/Throat: Mucous membranes are dry Neck:  Painless ROM Cardiovascular: Normal rate, regular rhythm. Grossly normal heart sounds.  Good peripheral circulation. Respiratory: Normal respiratory effort.  No retractions. Lungs CTAB. Gastrointestinal: Soft and nontender. No distention.    Musculoskeletal:  Warm and well perfused Neurologic:  Normal speech and language. No gross focal neurologic deficits are appreciated.  Skin:  Skin is warm, dry and intact. No rash noted. Psychiatric: Mood and affect are normal. Speech and behavior are normal.  ____________________________________________   LABS (all labs ordered are  listed, but only abnormal results are displayed)  Labs Reviewed  CBC - Abnormal; Notable for the following components:      Result Value   WBC 12.3 (*)    All other components within normal limits  URINALYSIS, COMPLETE (UACMP) WITH MICROSCOPIC  BLOOD GAS, ARTERIAL   ____________________________________________  EKG  None ____________________________________________  RADIOLOGY  None ____________________________________________   PROCEDURES  Procedure(s) performed: No  Procedures   Critical Care performed: yes  CRITICAL CARE Performed by: Lavonia Drafts   Total critical care time:30 minutes  Critical care time was exclusive of separately billable procedures and treating other patients.  Critical care was necessary to treat or prevent imminent or life-threatening deterioration.  Critical care was time spent personally by me on the following activities: development of treatment plan with patient and/or surrogate as well as nursing, discussions with consultants, evaluation of patient's response to treatment, examination of patient, obtaining history from patient or surrogate, ordering and performing treatments and interventions, ordering and review of laboratory studies, ordering and review of radiographic studies, pulse oximetry and re-evaluation of patient's condition. ____________________________________________   INITIAL IMPRESSION / ASSESSMENT AND PLAN / ED COURSE  Pertinent labs & imaging results that were available during my care of the patient were reviewed by me and considered in my medical decision making (see chart for details).  Patient's lab work demonstrates glucose of 700, also consistent with acute kidney injury with a creatinine of 2.59.  We will treat with 2 L of IV saline however the patient will require admission for further management    ____________________________________________   FINAL CLINICAL IMPRESSION(S) / ED DIAGNOSES  Final  diagnoses:  Hyperglycemia  AKI (acute kidney injury) ([place])        Note:  This document was prepared using Dragon voice recognition software and may include unintentional dictation errors.   Lavonia Drafts, MD 01/24/19 1649    Lavonia Drafts, MD 02/06/19 [id number]

## 2019-01-24 NOTE — Progress Notes (Signed)
Family Meeting Note  Advance Directive:yes  Today a meeting took place with the Patient, spouse and mom at bedside    The following clinical team members were present during this meeting:MD  The following were discussed:Patient's diagnosis: Hyperosmolar nonketotic hyperglycemia, hyperlipidemia, hypertension, GI bleed, non-insulin-requiring diabetes mellitus, obstructive sleep apnea and multiple other medical problems is admitted to the hospital for high blood sugar and GI bleed.  Patient's outpatient EGD procedure got canceled secondary to high sugars.  Patient will be provided with aggressive hydration with IV fluid boluses and insulin drip and will be admitted to the hospital.   Patient verbalized understanding of the plan   patient's progosis: Unable to determine and Goals for treatment: Full Code  Patient's mom is his healthcare power of attorney  Additional follow-up to be provided: Hospitalist, intensivist, gastroenterology   Time spent during discussion:17 MIN  [name] Mango, MD

## 2019-01-24 NOTE — Progress Notes (Signed)
Pt refused cpap tonight. 

## 2019-01-24 NOTE — ED Notes (Signed)
Patient refusing second IV at this time 

## 2019-01-24 NOTE — Progress Notes (Signed)
Very MILD DKA  Will NOT start Insulin drip Start Lantus 10 units and start SSI Aggressive IVF's Stop Metformin Watch UOP   If sugars improved, can transfer out to gen med floor tonight   Corrin Parker, M.D.  [facility]  Medical Director [place] Director [facility] Cardio-Pulmonary Department

## 2019-01-24 NOTE — Progress Notes (Signed)
Anticoagulation monitoring(Lovenox):  57 yo male ordered Lovenox 30 mg Q24h  Filed Weights   01/24/19 1447  Weight: 240 lb 15.4 oz (109.3 kg)   BMI    Lab Results  Component Value Date   CREATININE 2.59 (H) 01/24/2019   CREATININE 1.06 11/22/2018   CREATININE 1.00 06/09/2018   Estimated Creatinine Clearance: 40 mL/min (A) (by C-G formula based on SCr of 2.59 mg/dL (H)). Hemoglobin & Hematocrit     Component Value Date/Time   HGB 15.7 01/24/2019 1459   HGB 16.3 09/05/2015 1131   HCT 44.4 01/24/2019 1459   HCT 46.1 09/05/2015 1131     Per Protocol for Patient with estCrcl > 30 ml/min and BMI < 40, will transition to Lovenox 40 mg Q24h.

## 2019-01-24 NOTE — Anesthesia Post-op Follow-up Note (Signed)
Anesthesia QCDR form completed.        

## 2019-01-24 NOTE — ED Notes (Signed)
Patient was sent from endo due to elevated glucose and AKI noted in labs. Patient ambulatory, A&O x4 and denies any acute symptoms at this time.

## 2019-01-24 NOTE — OR Nursing (Signed)
PT PRESENTED WITH BLOOD SUGAR OVER 600 CHECKED X2.  lAB DREW BLOOD THAT SHOWED BLOOD SUGAR IN 700'S. DR Marius Ditch AND DR Ouida Sills STATED TO SEND PT TO ER. PT. MOVED TO ER ON STRECTHER . Iv NS FLOWING. FAMILY W ITH PT. VITAL SIGNS STABLE. REPORT TO CHARGE NURSE.

## 2019-01-24 NOTE — H&P (Signed)
EGD cancelled as pt is in DKA and AKI. Pt will be sent to ER and hospital admission  [name], MD [address]  [place]  [place], [place] [address]  Main: [phone]  Fax: [phone] Pager: [phone]

## 2019-01-24 NOTE — ED Triage Notes (Signed)
Brought down from endo due to high glucose.

## 2019-01-24 NOTE — ED Notes (Signed)
RT called to obtain ABG

## 2019-01-25 LAB — COMPREHENSIVE METABOLIC PANEL
ALK PHOS: 62 U/L (ref 38–126)
ALT: 20 U/L (ref 0–44)
AST: 16 U/L (ref 15–41)
Albumin: 3.6 g/dL (ref 3.5–5.0)
Anion gap: 7 (ref 5–15)
BILIRUBIN TOTAL: 0.6 mg/dL (ref 0.3–1.2)
BUN: 29 mg/dL — ABNORMAL HIGH (ref 6–20)
CALCIUM: 8.3 mg/dL — AB (ref 8.9–10.3)
CO2: 23 mmol/L (ref 22–32)
Chloride: 103 mmol/L (ref 98–111)
Creatinine, Ser: 1.58 mg/dL — ABNORMAL HIGH (ref 0.61–1.24)
GFR calc Af Amer: 56 mL/min — ABNORMAL LOW (ref >60)
GFR calc non Af Amer: 48 mL/min — ABNORMAL LOW (ref >60)
Glucose, Bld: 158 mg/dL — ABNORMAL HIGH (ref 70–99)
Potassium: 3.5 mmol/L (ref 3.5–5.1)
Sodium: 133 mmol/L — ABNORMAL LOW (ref 135–145)
TOTAL PROTEIN: 6.4 g/dL — AB (ref 6.5–8.1)

## 2019-01-25 LAB — GLUCOSE, CAPILLARY
GLUCOSE-CAPILLARY: 163 mg/dL — AB (ref 70–99)
Glucose-Capillary: 132 mg/dL — ABNORMAL HIGH (ref 70–99)
Glucose-Capillary: 146 mg/dL — ABNORMAL HIGH (ref 70–99)
Glucose-Capillary: 148 mg/dL — ABNORMAL HIGH (ref 70–99)
Glucose-Capillary: 149 mg/dL — ABNORMAL HIGH (ref 70–99)
Glucose-Capillary: 151 mg/dL — ABNORMAL HIGH (ref 70–99)
Glucose-Capillary: 160 mg/dL — ABNORMAL HIGH (ref 70–99)
Glucose-Capillary: 165 mg/dL — ABNORMAL HIGH (ref 70–99)
Glucose-Capillary: 187 mg/dL — ABNORMAL HIGH (ref 70–99)
Glucose-Capillary: 190 mg/dL — ABNORMAL HIGH (ref 70–99)
Glucose-Capillary: 243 mg/dL — ABNORMAL HIGH (ref 70–99)
Glucose-Capillary: 244 mg/dL — ABNORMAL HIGH (ref 70–99)

## 2019-01-25 LAB — BASIC METABOLIC PANEL
Anion gap: 5 (ref 5–15)
Anion gap: 5 (ref 5–15)
BUN: 31 mg/dL — AB (ref 6–20)
BUN: 26 mg/dL — ABNORMAL HIGH (ref 6–20)
CALCIUM: 8 mg/dL — AB (ref 8.9–10.3)
CHLORIDE: 103 mmol/L (ref 98–111)
CO2: 24 mmol/L (ref 22–32)
CO2: 24 mmol/L (ref 22–32)
CREATININE: 1.52 mg/dL — AB (ref 0.61–1.24)
Calcium: 8 mg/dL — ABNORMAL LOW (ref 8.9–10.3)
Chloride: 103 mmol/L (ref 98–111)
Creatinine, Ser: 1.9 mg/dL — ABNORMAL HIGH (ref 0.61–1.24)
GFR calc Af Amer: 45 mL/min — ABNORMAL LOW (ref >60)
GFR calc Af Amer: 59 mL/min — ABNORMAL LOW (ref >60)
GFR calc non Af Amer: 39 mL/min — ABNORMAL LOW (ref >60)
GFR, EST NON AFRICAN AMERICAN: 50 mL/min — AB (ref >60)
Glucose, Bld: 171 mg/dL — ABNORMAL HIGH (ref 70–99)
Glucose, Bld: 176 mg/dL — ABNORMAL HIGH (ref 70–99)
Potassium: 3.6 mmol/L (ref 3.5–5.1)
Potassium: 3.5 mmol/L (ref 3.5–5.1)
Sodium: 132 mmol/L — ABNORMAL LOW (ref 135–145)
Sodium: 132 mmol/L — ABNORMAL LOW (ref 135–145)

## 2019-01-25 LAB — URINALYSIS, COMPLETE (UACMP) WITH MICROSCOPIC
Bacteria, UA: NONE SEEN
Bilirubin Urine: NEGATIVE
Glucose, UA: >=500 mg/dL — AB
Hgb urine dipstick: NEGATIVE
Ketones, ur: NEGATIVE mg/dL
Leukocytes, UA: NEGATIVE
Nitrite: NEGATIVE
Protein, ur: NEGATIVE mg/dL
Specific Gravity, Urine: 1.009 (ref 1.005–1.030)
Squamous Epithelial / HPF: NONE SEEN (ref 0–5)
pH: 6 (ref 5.0–8.0)

## 2019-01-25 LAB — CBC
HCT: 38.6 % — ABNORMAL LOW (ref 39.0–52.0)
Hemoglobin: 13.3 g/dL (ref 13.0–17.0)
MCH: 31.4 pg (ref 26.0–34.0)
MCHC: 34.5 g/dL (ref 30.0–36.0)
MCV: 91 fL (ref 80.0–100.0)
Platelets: 242 10*3/uL (ref 150–400)
RBC: 4.24 MIL/uL (ref 4.22–5.81)
RDW: 12 % (ref 11.5–15.5)
WBC: 10 10*3/uL (ref 4.0–10.5)
nRBC: 0 % (ref 0.0–0.2)

## 2019-01-25 LAB — HEMOGLOBIN AND HEMATOCRIT, BLOOD
HEMATOCRIT: 37.4 % — AB (ref 39.0–52.0)
Hemoglobin: 13 g/dL (ref 13.0–17.0)

## 2019-01-25 LAB — HEMOGLOBIN A1C
Hgb A1c MFr Bld: 12.7 % — ABNORMAL HIGH (ref 4.8–5.6)
Mean Plasma Glucose: 317.79 mg/dL

## 2019-01-25 MED ORDER — LIVING WELL WITH DIABETES BOOK
Freq: Once | Status: AC
  Administered 2019-01-25: 11:00:00
  Filled 2019-01-25: qty 1

## 2019-01-25 MED ORDER — DEXTROSE-NACL 5-0.45 % IV SOLN
INTRAVENOUS | Status: DC
  Administered 2019-01-25: 02:00:00 via INTRAVENOUS

## 2019-01-25 MED ORDER — DEXTROSE-NACL 5-0.45 % IV SOLN
INTRAVENOUS | Status: DC
  Administered 2019-01-25: 03:00:00 via INTRAVENOUS

## 2019-01-25 MED ORDER — DEXTROSE-NACL 5-0.45 % IV SOLN: INTRAVENOUS | Status: DC

## 2019-01-25 MED ORDER — INSULIN ASPART 100 UNIT/ML ~~LOC~~ SOLN: 3.0000 [IU] | SUBCUTANEOUS | Status: DC

## 2019-01-25 MED ORDER — INSULIN ASPART 100 UNIT/ML ~~LOC~~ SOLN
3.0000 [IU] | SUBCUTANEOUS | Status: DC
  Administered 2019-01-25: 3 [IU] via SUBCUTANEOUS
  Filled 2019-01-25: qty 1

## 2019-01-25 MED ORDER — INSULIN ASPART 100 UNIT/ML ~~LOC~~ SOLN
0.0000 [IU] | SUBCUTANEOUS | Status: DC
  Administered 2019-01-25 (×2): 3 [IU] via SUBCUTANEOUS
  Administered 2019-01-26: 2 [IU] via SUBCUTANEOUS
  Administered 2019-01-26 – 2019-01-27 (×6): 3 [IU] via SUBCUTANEOUS
  Administered 2019-01-27 (×2): 2 [IU] via SUBCUTANEOUS
  Filled 2019-01-25 (×12): qty 1

## 2019-01-25 MED ORDER — INSULIN STARTER KIT- PEN NEEDLES (ENGLISH)
1.0000 | Freq: Once | Status: AC
  Administered 2019-01-25: 1
  Filled 2019-01-25: qty 1

## 2019-01-25 MED ORDER — INSULIN REGULAR BOLUS VIA INFUSION
0.0000 [IU] | Freq: Three times a day (TID) | INTRAVENOUS | Status: DC
  Filled 2019-01-25: qty 10

## 2019-01-25 MED ORDER — INSULIN DETEMIR 100 UNIT/ML ~~LOC~~ SOLN: 15.0000 [IU] | Freq: Two times a day (BID) | SUBCUTANEOUS | Status: DC

## 2019-01-25 MED ORDER — KCL IN DEXTROSE-NACL 20-5-0.45 MEQ/L-%-% IV SOLN
INTRAVENOUS | Status: DC
  Administered 2019-01-25: 07:00:00 via INTRAVENOUS
  Filled 2019-01-25 (×5): qty 1000

## 2019-01-25 MED ORDER — INSULIN ASPART 100 UNIT/ML ~~LOC~~ SOLN: 7.0000 [IU] | SUBCUTANEOUS | Status: DC

## 2019-01-25 MED ORDER — INSULIN DETEMIR 100 UNIT/ML ~~LOC~~ SOLN
18.0000 [IU] | Freq: Two times a day (BID) | SUBCUTANEOUS | Status: DC
  Administered 2019-01-25: 18 [IU] via SUBCUTANEOUS
  Filled 2019-01-25 (×3): qty 0.18

## 2019-01-25 MED ORDER — INSULIN REGULAR(HUMAN) IN NACL 100-0.9 UT/100ML-% IV SOLN: INTRAVENOUS | Status: DC

## 2019-01-25 MED ORDER — SODIUM CHLORIDE 0.9 % IV SOLN: INTRAVENOUS | Status: DC

## 2019-01-25 MED ORDER — DEXTROSE 50 % IV SOLN: 25.0000 mL | INTRAVENOUS | Status: DC | PRN

## 2019-01-25 MED ORDER — DEXTROSE 10 % IV SOLN: INTRAVENOUS | Status: DC | PRN

## 2019-01-25 NOTE — Progress Notes (Signed)
Ch directed pt son to pt room in ICU. Son wanted to see his father before going to work. Pt was responsive and in an upbeat mood. Pt was glad to see son. Ch shared that they would f/u w/ pt at another time. Pt and family were appreciative of help.    01/25/19 0815  Clinical Encounter Type  Visited With Patient and family together  Visit Type Initial;Psychological support;Spiritual support;Social support  Referral From Family  Consult/Referral To Chaplain  Spiritual Encounters  Spiritual Needs Emotional  Stress Factors  Patient Stress Factors Health changes  Family Stress Factors Major life changes

## 2019-01-25 NOTE — Progress Notes (Signed)
[place] [facility] [place] NAME: [name] [name]    MR#:  [id number]  DATE OF BIRTH:  04/25/1961  SUBJECTIVE:  CHIEF COMPLAINT: Patient is feeling much better today.  Had one bowel movement but denies any blood in it.  Waiting for GI to come and see the patient tolerating diet.  REVIEW OF SYSTEMS:  CONSTITUTIONAL: No fever, fatigue or weakness.  EYES: No blurred or double vision.  EARS, NOSE, AND THROAT: No tinnitus or ear pain.  RESPIRATORY: No cough, shortness of breath, wheezing or hemoptysis.  CARDIOVASCULAR: No chest pain, orthopnea, edema.  GASTROINTESTINAL: No nausea, vomiting, diarrhea or abdominal pain.  GENITOURINARY: No dysuria, hematuria.  ENDOCRINE: No polyuria, nocturia,  HEMATOLOGY: No anemia, easy bruising or bleeding SKIN: No rash or lesion. MUSCULOSKELETAL: No joint pain or arthritis.   NEUROLOGIC: No tingling, numbness, weakness.  PSYCHIATRY: No anxiety or depression.   DRUG ALLERGIES:   Allergies  Allergen Reactions  . Gabapentin Other (See Comments)    Groggy-Mood Changes  . Latex Rash  . Penicillins Hives and Rash    Has patient had a PCN reaction causing immediate rash, facial/tongue/throat swelling, SOB or lightheadedness with hypotension: Yes Has patient had a PCN reaction causing severe rash involving mucus membranes or skin necrosis: No Has patient had a PCN reaction that required hospitalization No Has patient had a PCN reaction occurring within the last 10 years: No If all of the above answers are "NO", then may proceed with Cephalosporin use.     VITALS:  Blood pressure (!) 109/59, pulse (!) 58, temperature 97.8 F (36.6 C), temperature source Oral, resp. rate 18, height 5\' 11"  (1.803 m), weight 114.5 kg, SpO2 98 %.  PHYSICAL EXAMINATION:  GENERAL:  58 y.o.-year-old patient lying in the bed with no acute distress.  EYES: Pupils equal, round, reactive to light and accommodation. No scleral icterus.  Extraocular muscles intact.  HEENT: Head atraumatic, normocephalic. Oropharynx and nasopharynx clear.  NECK:  Supple, no jugular venous distention. No thyroid enlargement, no tenderness.  LUNGS: Normal breath sounds bilaterally, no wheezing, rales,rhonchi or crepitation. No use of accessory muscles of respiration.  CARDIOVASCULAR: S1, S2 normal. No murmurs, rubs, or gallops.  ABDOMEN: Soft, nontender, nondistended. Bowel sounds present. No organomegaly or mass.  EXTREMITIES: No pedal edema, cyanosis, or clubbing.  NEUROLOGIC: Awake alert and oriented times 3  sensation intact. Gait not checked.  PSYCHIATRIC: The patient is alert and oriented x 3.  SKIN: No obvious rash, lesion, or ulcer.    LABORATORY PANEL:   CBC Recent Labs  Lab 01/25/19 0414 01/25/19 0741  WBC 10.0  --   HGB 13.3 13.0  HCT 38.6* 37.4*  PLT 242  --    ------------------------------------------------------------------------------------------------------------------  Chemistries  Recent Labs  Lab 01/25/19 0414 01/25/19 0741  NA 133* 132*  K 3.5 3.5  CL 103 103  CO2 23 24  GLUCOSE 158* 176*  BUN 29* 26*  CREATININE 1.58* 1.52*  CALCIUM 8.3* 8.0*  AST 16  --   ALT 20  --   ALKPHOS 62  --   BILITOT 0.6  --    ------------------------------------------------------------------------------------------------------------------  Cardiac Enzymes No results for input(s): TROPONINI in the last 168 hours. ------------------------------------------------------------------------------------------------------------------  RADIOLOGY:  No results found.  EKG:   Orders placed or performed in visit on 10/25/18  . EKG 12-Lead    ASSESSMENT AND PLAN:   #Hyperosmolar nonketotic hyperglycemia Clinically improving.  Off insulin drip   hemoglobin A1c 12.7   #  Pseudohyponatremia secondary to hyperglycemia Sodium is 132 Off insulin drip  #History of GI bleed Patient's endoscopy got canceled today because of  the high sugars pending  gastroenterology consult Dr. Bonna Gains, text message sent via haiku IV Protonix Hemoglobin 13.3  #AKI secondary to dehydration from hyperglycemia Aggressive hydration with IV fluids and avoid nephrotoxins hold metformin  #Essential hypertension-currently blood pressure is stable hold antihypertensives  #Obstructive sleep apnea CPAP nightly  DVT prophylaxis with Lovenox subcu renal dose adjusted    All the records are reviewed and case discussed with Care Management/Social Workerr. Management plans discussed with the patient, family and they are in agreement.  CODE STATUS:   TOTAL TIME TAKING CARE OF THIS PATIENT: 36  minutes.   POSSIBLE D/C IN 1-2  DAYS, DEPENDING ON CLINICAL CONDITION.  Note: This dictation was prepared with Dragon dictation along with smaller phrase technology. Any transcriptional errors that result from this process are unintentional.   Nicholes Mango M.D on 01/25/2019 at 2:38 PM  Between 7am to 6pm - Pager - [phone] After 6pm go to [email or web address] - password [email or web address]  [place] [facility]  Office  [phone]  CC: Primary care physician; Steele Sizer, MD

## 2019-01-25 NOTE — Progress Notes (Signed)
Per Dr. Alice Reichert okay for RN to make pt NPO after midnight. Pt might have EGD done tomorrow morning.

## 2019-01-25 NOTE — Progress Notes (Signed)
Pt has been on insulin drip for HHNK.  Pt's CBG's are now less than 250.  Considered weaning pt off insulin drip and transitioning to Lantus and SSI.  However pt was to undergo elective EGD yesterday.  GI is to see pt today and to determine whether pt will undergo EGD this admission.  Given that pt could possibly need EGD this admission, will keep NPO.  Since NPO for now, will keep on insulin drip for now (to avoid hypoglycemia with long acting insulin), until EGD status is decided.  Once pt able to have diet, he can be converted at that time.    Darel Hong, AGACNP-BC [facility] Pager: [phone] Cell: [phone]

## 2019-01-25 NOTE — Progress Notes (Addendum)
Inpatient Diabetes Program Recommendations  AACE/ADA: New Consensus Statement on Inpatient Glycemic Control  Target Ranges:  Prepandial:   less than 140 mg/dL      Peak postprandial:   less than 180 mg/dL (1-2 hours)      Critically ill patients:  140 - 180 mg/dL  Results for [name] [name], [name] [name] (MRN [id number]) as of 01/25/2019 10:28  Ref. Range 01/25/2019 04:11 01/25/2019 05:46 01/25/2019 06:49 01/25/2019 07:47 01/25/2019 09:02 01/25/2019 09:57  Glucose-Capillary Latest Ref Range: 70 - 99 mg/dL 149 (H) 160 (H) 165 (H) 187 (H) 163 (H) 146 (H)   Results for [name] [name], [name] [name] (MRN [id number]) as of 01/25/2019 10:28  Ref. Range 06/21/2018 11:51 11/22/2018 14:01 01/25/2019 04:14  Hemoglobin A1C Latest Ref Range: 4.8 - 5.6 % 7.0 (A) 6.7 (H) 12.7 (H)  Results for [name] [name], [name] [name] (MRN [id number]) as of 01/25/2019 10:28  Ref. Range 01/24/2019 13:58  Glucose Latest Ref Range: 70 - 99 mg/dL 706 (HH)   Review of Glycemic Control  Diabetes history: DM2 Outpatient Diabetes medications: Metformin 850 mg BID Current orders for Inpatient glycemic control: IV insulin drip  Inpatient Diabetes Program Recommendations:  Insulin - Once MD is ready to transition from IV to SQ insulin, please consider ordering Lantus 20 units Q24H, CBGs Q4H, and Novolog 0-9 units Q4H. HgbA1C: A1C 12.7% on 01/25/19 indicating an average glucose of 318 mg/dl over the past 2-3 months.  Noted prior A1C 6.7% on 11/22/2018.   NOTE: In reviewing patient's chart, noted patient seen Dr. Melrose Nakayama on 11/21/2018 and he received an injection of Decadron 10 mg IM into Right [place] on 11/21/18 at office visit.  Noted patient is currently NPO for possible EGD and it was noted that IV insulin will be continued until EGD status is decided.  Will plan to see patient today.  Addendum 01/25/19@14 :45-Spoke with patient and his wife about diabetes and home regimen for diabetes control. Patient reports that he was dx with DM about 5 years ago and is followed by  PCP for diabetes management. Patient reports that he takes Metformin 850 mg BID as an outpatient for diabetes control which he has been taking consistently. Patient states that he has never been on any other medications for DM control in the past.  Patient notes that he has been to the [facility] in the past and received DM outpatient education which he states helped him to have a better understanding of DM and what he needed to do for control. Patient states that his DM has been well controlled since then with diet and Metformin.  Patient states that he does not check glucose routinely (last checked about 2 months ago). Patient notes that he will need a prescription for a new glucometer and testing supplies as his glucometer is old and he request a prescription for a new glucometer and testing supplies.  Inquired about any recent steroids and patient notes that he hasn't taken any steroids recently. Explained that it was noted in chart that he received a Decadron injection on 11/21/18 which is a steroid. Discussed impact of steroids on glycemic control.  Anticipate recent steroid injection initiated hyperglycemia. Patient reports that he has been experiencing symptoms of hyperglycemia for at least 4-5 weeks and he notes over the past couple of weeks they have getting worse (urinating 6-8 times per night, extreme thirst, excessive fatigue and weakness).   Prior A1C noted to be 6.7% on 11/22/2018.  Discussed A1C results (12.7% on 01/25/19) and explained that his current  A1C indicates an average glucose of 319 mg/dl over the past 2-3 months. Discussed glucose and A1C goals. Discussed importance of checking CBGs and maintaining good CBG control to prevent long-term and short-term complications. Discussed transition from IV to SQ insulin with Levemir and Novolog insulin.  Discussed both Levemir and Novolog insulin in more detail with how they work for DM control.  Inquired about willingness to take insulin as a  outpatient and patient notes that he will take insulin if he needs to but he notes that his wife will have to give him his insulin shots due to a fear of needles.  Patient's wife reports that she use to help give her mother insulin injections with vial/syringe in the past and she is willing to give insulin to her husband if necessary.  Reviewed both vial/syringe and insulin pens with patient and his wife and patient would not look at the needle and said he could not even look at the needle but he would be fine with his wife giving him insulin injections.  Explained that if he has to take insulin as an outpatient, he may have to self administer insulin since his wife works during the day.  Patient states he doesn't think he would be able to self administer insulin if wife is not at home to give him an injection.  Encouraged patient to check his glucose 4 times per day (before meals and at bedtime) and to keep a log book of glucose readings. Encouraged patient to ask PCP about referring him to an Endocrinologist for assistance with DM management.   Patient verbalized understanding of information discussed and he states that he has no further questions at this time related to diabetes. Informed patient and his wife that Diabetes Coordinator will follow back up with him tomorrow.  If patient is not able to self administer insulin to himself while his wife is at work, may have to use basal insulin, Metformin, and  additional oral DM medication(s) for DM control.  Thanks, Barnie Alderman, RN, MSN, CDE Diabetes Coordinator Inpatient Diabetes Program [phone] (Team Pager from 8am to 5pm)

## 2019-01-26 ENCOUNTER — Encounter
Admission: EM | Disposition: A | Discharge status: Home / Self Care | Payer: Self-pay | Source: Home / Self Care | Attending: Internal Medicine

## 2019-01-26 ENCOUNTER — Inpatient Hospital Stay: Payer: Medicaid Other | Admitting: Certified Registered"

## 2019-01-26 ENCOUNTER — Encounter: Payer: Self-pay | Admitting: Student

## 2019-01-26 DIAGNOSIS — R1013 Epigastric pain: Secondary | ICD-10-CM

## 2019-01-26 HISTORY — PX: ESOPHAGOGASTRODUODENOSCOPY (EGD) WITH PROPOFOL: SHX5813

## 2019-01-26 LAB — BASIC METABOLIC PANEL
Anion gap: 7 (ref 5–15)
BUN: 18 mg/dL (ref 6–20)
CALCIUM: 8.7 mg/dL — AB (ref 8.9–10.3)
CO2: 22 mmol/L (ref 22–32)
Chloride: 104 mmol/L (ref 98–111)
Creatinine, Ser: 1.07 mg/dL (ref 0.61–1.24)
GFR calc Af Amer: >60 mL/min (ref >60)
GFR calc non Af Amer: >60 mL/min (ref >60)
GLUCOSE: 200 mg/dL — AB (ref 70–99)
Potassium: 3.9 mmol/L (ref 3.5–5.1)
Sodium: 133 mmol/L — ABNORMAL LOW (ref 135–145)

## 2019-01-26 LAB — GLUCOSE, CAPILLARY
Glucose-Capillary: 170 mg/dL — ABNORMAL HIGH (ref 70–99)
Glucose-Capillary: 182 mg/dL — ABNORMAL HIGH (ref 70–99)
Glucose-Capillary: 193 mg/dL — ABNORMAL HIGH (ref 70–99)
Glucose-Capillary: 193 mg/dL — ABNORMAL HIGH (ref 70–99)
Glucose-Capillary: 204 mg/dL — ABNORMAL HIGH (ref 70–99)
Glucose-Capillary: 208 mg/dL — ABNORMAL HIGH (ref 70–99)
Glucose-Capillary: 214 mg/dL — ABNORMAL HIGH (ref 70–99)
Glucose-Capillary: 240 mg/dL — ABNORMAL HIGH (ref 70–99)

## 2019-01-26 LAB — CBC
HCT: 40.7 % (ref 39.0–52.0)
Hemoglobin: 13.9 g/dL (ref 13.0–17.0)
MCH: 31.5 pg (ref 26.0–34.0)
MCHC: 34.2 g/dL (ref 30.0–36.0)
MCV: 92.3 fL (ref 80.0–100.0)
Platelets: 223 10*3/uL (ref 150–400)
RBC: 4.41 MIL/uL (ref 4.22–5.81)
RDW: 12.1 % (ref 11.5–15.5)
WBC: 8.5 10*3/uL (ref 4.0–10.5)
nRBC: 0 % (ref 0.0–0.2)

## 2019-01-26 LAB — HIV ANTIBODY (ROUTINE TESTING W REFLEX): HIV Screen 4th Generation wRfx: NONREACTIVE

## 2019-01-26 SURGERY — ESOPHAGOGASTRODUODENOSCOPY (EGD) WITH PROPOFOL
Anesthesia: General

## 2019-01-26 MED ORDER — INSULIN GLARGINE 100 UNIT/ML ~~LOC~~ SOLN
20.0000 [IU] | Freq: Every day | SUBCUTANEOUS | Status: DC
  Administered 2019-01-26 – 2019-01-27 (×2): 20 [IU] via SUBCUTANEOUS
  Filled 2019-01-26 (×2): qty 0.2

## 2019-01-26 MED ORDER — LORATADINE 10 MG PO TABS
10.0000 mg | ORAL_TABLET | Freq: Every day | ORAL | Status: DC
  Administered 2019-01-26 – 2019-01-27 (×2): 10 mg via ORAL
  Filled 2019-01-26 (×2): qty 1

## 2019-01-26 MED ORDER — PHENYLEPHRINE HCL 10 MG/ML IJ SOLN
INTRAMUSCULAR | Status: DC | PRN
  Administered 2019-01-26: 100 ug via INTRAVENOUS

## 2019-01-26 MED ORDER — VENLAFAXINE HCL 37.5 MG PO TABS
75.0000 mg | ORAL_TABLET | Freq: Every day | ORAL | Status: DC
  Filled 2019-01-26: qty 2

## 2019-01-26 MED ORDER — LIDOCAINE HCL (CARDIAC) PF 100 MG/5ML IV SOSY
PREFILLED_SYRINGE | INTRAVENOUS | Status: DC | PRN
  Administered 2019-01-26: 100 mg via INTRAVENOUS

## 2019-01-26 MED ORDER — RANOLAZINE ER 500 MG PO TB12
1000.0000 mg | ORAL_TABLET | Freq: Two times a day (BID) | ORAL | Status: DC
  Administered 2019-01-26 – 2019-01-27 (×2): 1000 mg via ORAL
  Filled 2019-01-26 (×3): qty 2

## 2019-01-26 MED ORDER — PROPOFOL 10 MG/ML IV BOLUS
INTRAVENOUS | Status: DC | PRN
  Administered 2019-01-26 (×4): 50 mg via INTRAVENOUS

## 2019-01-26 MED ORDER — SODIUM CHLORIDE 0.9 % IV SOLN
INTRAVENOUS | Status: DC
  Administered 2019-01-26: 10:00:00 via INTRAVENOUS

## 2019-01-26 MED ORDER — ATORVASTATIN CALCIUM 20 MG PO TABS
80.0000 mg | ORAL_TABLET | Freq: Every day | ORAL | Status: DC
  Administered 2019-01-26: 80 mg via ORAL
  Filled 2019-01-26: qty 4

## 2019-01-26 MED ORDER — METFORMIN HCL 500 MG PO TABS
500.0000 mg | ORAL_TABLET | Freq: Two times a day (BID) | ORAL | Status: DC
  Administered 2019-01-26 – 2019-01-27 (×2): 500 mg via ORAL
  Filled 2019-01-26 (×2): qty 1

## 2019-01-26 MED ORDER — FLUTICASONE PROPIONATE 50 MCG/ACT NA SUSP
2.0000 | Freq: Every day | NASAL | Status: DC
  Administered 2019-01-26 – 2019-01-27 (×2): 2 via NASAL
  Filled 2019-01-26: qty 16

## 2019-01-26 MED ORDER — ASPIRIN EC 81 MG PO TBEC
81.0000 mg | DELAYED_RELEASE_TABLET | Freq: Every day | ORAL | Status: DC
  Administered 2019-01-26 – 2019-01-27 (×2): 81 mg via ORAL
  Filled 2019-01-26 (×2): qty 1

## 2019-01-26 MED ORDER — DICLOFENAC SODIUM 1 % TD GEL
4.0000 g | Freq: Two times a day (BID) | TRANSDERMAL | Status: DC | PRN
  Filled 2019-01-26: qty 100

## 2019-01-26 MED ORDER — LINAGLIPTIN 5 MG PO TABS
5.0000 mg | ORAL_TABLET | Freq: Every day | ORAL | Status: DC
  Administered 2019-01-26 – 2019-01-27 (×2): 5 mg via ORAL
  Filled 2019-01-26 (×2): qty 1

## 2019-01-26 MED ORDER — INSULIN GLARGINE 100 UNIT/ML ~~LOC~~ SOLN
20.0000 [IU] | Freq: Every day | SUBCUTANEOUS | Status: DC
  Filled 2019-01-26: qty 0.2

## 2019-01-26 MED ORDER — GLYCOPYRROLATE 0.2 MG/ML IJ SOLN
INTRAMUSCULAR | Status: DC | PRN
  Administered 2019-01-26: 0.2 mg via INTRAVENOUS

## 2019-01-26 MED ORDER — INSULIN STARTER KIT- PEN NEEDLES (ENGLISH)
1.0000 | Freq: Once | Status: AC
  Administered 2019-01-26: 1
  Filled 2019-01-26: qty 1

## 2019-01-26 MED ORDER — NORTRIPTYLINE HCL 10 MG PO CAPS
10.0000 mg | ORAL_CAPSULE | Freq: Every day | ORAL | Status: DC
  Administered 2019-01-26: 20 mg via ORAL
  Filled 2019-01-26 (×2): qty 2

## 2019-01-26 NOTE — Anesthesia Postprocedure Evaluation (Signed)
Anesthesia Post Note  Patient: [name] [name]  Procedure(s) Performed: ESOPHAGOGASTRODUODENOSCOPY (EGD) WITH PROPOFOL (N/A )  Patient location during evaluation: Endoscopy Anesthesia Type: General Level of consciousness: awake and alert Pain management: pain level controlled Vital Signs Assessment: post-procedure vital signs reviewed and stable Respiratory status: spontaneous breathing, nonlabored ventilation, respiratory function stable and patient connected to nasal cannula oxygen Cardiovascular status: blood pressure returned to baseline and stable Postop Assessment: no apparent nausea or vomiting Anesthetic complications: no     Last Vitals:  Vitals:   01/26/19 1215 01/26/19 1225  BP: (!) 137/95 (!) 128/94  Pulse:    Resp:    Temp:    SpO2:      Last Pain:  Vitals:   01/26/19 1215  TempSrc:   PainSc: 0-No pain                 Precious Haws Lyfe Monger

## 2019-01-26 NOTE — Transfer of Care (Signed)
Immediate Anesthesia Transfer of Care Note  Patient: [name] [name]  Procedure(s) Performed: ESOPHAGOGASTRODUODENOSCOPY (EGD) WITH PROPOFOL (N/A )  Patient Location: PACU and Endoscopy Unit  Anesthesia Type:General  Level of Consciousness: awake, alert  and patient cooperative  Airway & Oxygen Therapy: Patient Spontanous Breathing  Post-op Assessment: Report given to RN and Post -op Vital signs reviewed and stable  Post vital signs: Reviewed and stable  Last Vitals:  Vitals Value Taken Time  BP 119/85 01/26/2019 12:09 PM  Temp    Pulse 84 01/26/2019 12:09 PM  Resp 13 01/26/2019 12:09 PM  SpO2 93 % 01/26/2019 12:09 PM  Vitals shown include unvalidated device data.  Last Pain:  Vitals:   01/26/19 0953  TempSrc: Oral  PainSc: 0-No pain         Complications: No apparent anesthesia complications

## 2019-01-26 NOTE — H&P (Signed)
[name] Darby, MD [address]  [place]  [place], [place] [address]  Main: [phone]  Fax: [phone] Pager: [phone]  Primary Care Physician:  Steele Sizer, MD Primary Gastroenterologist:  Dr. Cephas [name]  Pre-Procedure History & Physical: HPI:  [name] [name] is a 58 y.o. male is here for an endoscopy.   Past Medical History:  Diagnosis Date  . Allergy   . Angina, class III ([place])   . Chronic constipation   . Degeneration of intervertebral disc of cervical region   . Diabetes mellitus without complication ([place])   . Diastolic dysfunction    a. 06/2017 Echo: EF 60-65%, no rwma, Gr1 DD.  Marland Kitchen Dyspnea   . GERD (gastroesophageal reflux disease)   . Hernia 1991   02/10/2012-RIH repair  . Hyperlipidemia   . Hypertension 2008  . Nerve root pain   . Neuropathy   . Non-obstructive CAD (coronary artery disease)    a. 05/30/2015 cath: LM nl, mLAD 50% (FFR 0.83), LCx minor irregs, RCA minor irregs, EF 55-65%-->Med Rx; b. 03/2016 Cath: LM nl LAD 25m D1/2/3 min irregs, LCX min irregs, OM1/2/3 nl, RCA min irregs, RPDA/RPAV/RPL1/RPL2 nl-->Med Rx.  . Obesity, unspecified 2012  . Personal history of tobacco use, presenting hazards to health 2012  . Polycythemia   . Recurrent Right Inguinal Hernia Repair 02/09/2011, 04/07/2013   [facility] . Sleep apnea    a. On CPAP;  b. 06/2017 Echo: no PAH.  .Marland KitchenUmbilical hernia without mention of obstruction or gangrene 02/09/2011   [facility] . Vitamin D deficiency     Past Surgical History:  Procedure Laterality Date  . BACK SURGERY  12/2008   X2-LUMBAR  . CARDIAC CATHETERIZATION N/A 05/30/2015   Procedure: Left Heart Cath;  Surgeon: MWellington Hampshire MD;  [facility];  Service: Cardiovascular;  Laterality: N/A;  . CARDIAC CATHETERIZATION N/A 04/16/2016   Procedure: Left Heart Cath and Coronary Angiography;  Surgeon: MWellington Hampshire MD;  [facility];  Service: Cardiovascular;   Laterality: N/A;  . COLONOSCOPY  2014   Dr. SJamal Collin . COLONOSCOPY WITH PROPOFOL N/A 12/25/2016   Procedure: COLONOSCOPY WITH PROPOFOL;  Surgeon: KJonathon Bellows MD;  Location: [facility] ENDOSCOPY;  Service: Endoscopy;  Laterality: N/A;  . COLONOSCOPY WITH PROPOFOL N/A 01/29/2017   Procedure: COLONOSCOPY WITH PROPOFOL;  Surgeon: KJonathon Bellows MD;  Location: [facility] ENDOSCOPY;  Service: Endoscopy;  Laterality: N/A;  . EVALUATION UNDER ANESTHESIA WITH HEMORRHOIDECTOMY N/A 02/24/2017   Procedure: EXAM UNDER ANESTHESIA WITH POSSIBLE EXCISION OF INTERNAL HEMORRHOIDS;  Surgeon: JOlean Ree MD;  Location: [facility] ORS;  Service: General;  Laterality: N/A;  . FISSURECTOMY  02/24/2017   Procedure: FISSURECTOMY;  Surgeon: JOlean Ree MD;  Location: [facility] ORS;  Service: General;;  . HERNIA REPAIR Right 1991  . INGUINAL HERNIA REPAIR Right 2012   Dr SJamal Collin . INGUINAL HERNIA REPAIR Right 2014   Dr SJamal Collin   Prior to Admission medications   Medication Sig Start Date End Date Taking? Authorizing Provider  ACCU-CHEK SMARTVIEW test strip Check fsbs twice daily E11.9 03/18/18  Yes Sowles, KDrue Stager MD  aspirin 81 MG tablet Take 81 mg by mouth daily.   Yes [provider]  atorvastatin (LIPITOR) 80 MG tablet TAKE 1 TABLET BY MOUTH EVERY DAY 12/04/18  Yes Sowles, KDrue Stager MD  carvedilol (COREG) 12.5 MG tablet TAKE 1 TABLET BY MOUTH TWICE DAILY 11/21/18  Yes Gollan, TKathlene November MD  cetirizine (  ZYRTEC) 10 MG tablet Take 10 mg by mouth daily as needed for allergies.  07/09/16  Yes [provider]  cyanocobalamin (,VITAMIN B-12,) 1000 MCG/ML injection Inject 1,000 mcg into the muscle every 30 (thirty) days.   Yes [provider]  diclofenac sodium (VOLTAREN) 1 % GEL Apply 4 g topically 2 (two) times daily as needed. 12/30/18  Yes Sowles, Drue Stager, MD  dicyclomine (BENTYL) 10 MG capsule TAKE 1 CAPSULE BY MOUTH THREE TIMES A DAY BEFORE MEALS 04/27/18  Yes Jonathon Bellows, MD  FLECTOR 1.3 % [facility] Place 1 patch onto the  skin daily as needed (pain). 12/23/18  Yes Sowles, Drue Stager, MD  fluticasone (FLONASE) 50 MCG/ACT nasal spray Place 2 sprays into the nose daily as needed for allergies or rhinitis.  03/05/15  Yes [provider]  Lancets (ACCU-CHEK SOFT TOUCH) lancets Use as instructed 03/15/18  Yes Sowles, Drue Stager, MD  Lancets Misc. (ACCU-CHEK FASTCLIX LANCET) KIT Use as instructed. 03/18/18  Yes Sowles, Drue Stager, MD  losartan-hydrochlorothiazide (HYZAAR) 100-12.5 MG tablet Take 1 tablet by mouth daily. 06/21/18  Yes Sowles, Drue Stager, MD  metFORMIN (GLUCOPHAGE) 850 MG tablet TAKE 1 TABLET(850 MG) BY MOUTH TWICE DAILY WITH A MEAL 07/31/18  Yes Sowles, Drue Stager, MD  NITROSTAT 0.4 MG SL tablet place 1 tablet under the tongue every 5 minutes for UP TO 3 doses if needed for angina as directed by prescriber 01/15/17  Yes Wellington Hampshire, MD  nortriptyline (PAMELOR) 10 MG capsule take 1 capsule at bedtime daily 02/17/17  Yes [provider]  omeprazole (PRILOSEC) 20 MG capsule Take 1 capsule (20 mg total) by mouth every morning. 03/10/17  Yes Sowles, Drue Stager, MD  ranolazine (RANEXA) 1000 MG SR tablet TAKE 1 TABLET BY MOUTH TWICE DAILY 11/21/18  Yes Gollan, Kathlene November, MD  selenium sulfide (SELSUN) 2.5 % shampoo Apply 1 application topically daily as needed for irritation. 06/21/18  Yes Sowles, Drue Stager, MD  sildenafil (REVATIO) 20 MG tablet 1 TO 2 TABLETS BY MOUTH EVERY DAY 10/27/18  Yes [provider]  venlafaxine (EFFEXOR) 75 MG tablet Take 1 tablet by mouth daily. 06/08/18  Yes [provider]    Allergies as of 01/24/2019 - Review Complete 01/24/2019  Allergen Reaction Noted  . Gabapentin Other (See Comments) 04/16/2015  . Latex Rash 06/20/2015  . Penicillins Hives and Rash 03/23/2013    Family History  Problem Relation Age of Onset  . Heart Problems Mother   . Heart Problems Father 30       myocardial infarction  . Mental illness Brother     Social History   Socioeconomic History    . Marital status: Single    Spouse name: Not on file  . Number of children: Not on file  . Years of education: Not on file  . Highest education level: Not on file  Occupational History  . Not on file  Social Needs  . Financial resource strain: Not on file  . Food insecurity:    Worry: Not on file    Inability: Not on file  . Transportation needs:    Medical: Not on file    Non-medical: Not on file  Tobacco Use  . Smoking status: Former Smoker    Years: 2.00    Types: Cigars    Start date: 12/28/2002    Last attempt to quit: 06/04/2005    Years since quitting: 13.6  . Smokeless tobacco: Never Used  Substance and Sexual Activity  . Alcohol use: Yes  Alcohol/week: 1.0 standard drinks    Types: 1 Glasses of wine per week    Comment: RARE BEER  . Drug use: No  . Sexual activity: Yes    Partners: Female  Lifestyle  . Physical activity:    Days per week: Not on file    Minutes per session: Not on file  . Stress: Not on file  Relationships  . Social connections:    Talks on phone: Not on file    Gets together: Not on file    Attends religious service: Not on file    Active member of club or organization: Not on file    Attends meetings of clubs or organizations: Not on file    Relationship status: Not on file  . Intimate partner violence:    Fear of current or ex partner: Not on file    Emotionally abused: Not on file    Physically abused: Not on file    Forced sexual activity: Not on file  Other Topics Concern  . Not on file  Social History Narrative  . Not on file    Review of Systems: See HPI, otherwise negative ROS  Physical Exam: BP 131/71   Pulse 66   Temp 97.9 F (36.6 C) (Oral)   Resp 18   Ht 5' 11" (1.803 m)   Wt 114.5 kg   SpO2 98%   BMI 35.21 kg/m  General:   Alert,  pleasant and cooperative in NAD Head:  Normocephalic and atraumatic. Neck:  Supple; no masses or thyromegaly. Lungs:  Clear throughout to auscultation.    Heart:  Regular rate  and rhythm. Abdomen:  Soft, nontender and nondistended. Normal bowel sounds, without guarding, and without rebound.   Neurologic:  Alert and  oriented x4;  grossly normal neurologically.  Impression/Plan: [name] [name] is here for an endoscopy to be performed for upper abdominal pain, nausea  Risks, benefits, limitations, and alternatives regarding  endoscopy have been reviewed with the patient.  Questions have been answered.  All parties agreeable.   [name], MD  01/26/2019, 11:33 AM

## 2019-01-26 NOTE — Progress Notes (Signed)
RN educated pt and pt's wife to give give insulin shots. Pt didn't want to give his insulin shots this time, but his wife was able to give the shots. Pt stated he will try to do it later.

## 2019-01-26 NOTE — Progress Notes (Signed)
[place] [facility] [place] NAME: [name] [name]    MR#:  [id number]  DATE OF BIRTH:  11-30-1961  SUBJECTIVE:  CHIEF COMPLAINT: Patient is feelingiting fine.  No complaints overnight Family at bedside  REVIEW OF SYSTEMS:  CONSTITUTIONAL: No fever, fatigue or weakness.  EYES: No blurred or double vision.  EARS, NOSE, AND THROAT: No tinnitus or ear pain.  RESPIRATORY: No cough, shortness of breath, wheezing or hemoptysis.  CARDIOVASCULAR: No chest pain, orthopnea, edema.  GASTROINTESTINAL: No nausea, vomiting, diarrhea or abdominal pain.  GENITOURINARY: No dysuria, hematuria.  ENDOCRINE: No polyuria, nocturia,  HEMATOLOGY: No anemia, easy bruising or bleeding SKIN: No rash or lesion. MUSCULOSKELETAL: No joint pain or arthritis.   NEUROLOGIC: No tingling, numbness, weakness.  PSYCHIATRY: No anxiety or depression.   DRUG ALLERGIES:   Allergies  Allergen Reactions  . Gabapentin Other (See Comments)    Groggy-Mood Changes  . Latex Rash  . Penicillins Hives and Rash    Has patient had a PCN reaction causing immediate rash, facial/tongue/throat swelling, SOB or lightheadedness with hypotension: Yes Has patient had a PCN reaction causing severe rash involving mucus membranes or skin necrosis: No Has patient had a PCN reaction that required hospitalization No Has patient had a PCN reaction occurring within the last 10 years: No If all of the above answers are "NO", then may proceed with Cephalosporin use.     VITALS:  Blood pressure 116/79, pulse 65, temperature 97.9 F (36.6 C), temperature source Oral, resp. rate 18, height 5\' 11"  (1.803 m), weight 114.5 kg, SpO2 95 %.  PHYSICAL EXAMINATION:  GENERAL:  58 y.o.-year-old patient lying in the bed with no acute distress.  EYES: Pupils equal, round, reactive to light and accommodation. No scleral icterus. Extraocular muscles intact.  HEENT: Head atraumatic, normocephalic. Oropharynx and  nasopharynx clear.  NECK:  Supple, no jugular venous distention. No thyroid enlargement, no tenderness.  LUNGS: Normal breath sounds bilaterally, no wheezing, rales,rhonchi or crepitation. No use of accessory muscles of respiration.  CARDIOVASCULAR: S1, S2 normal. No murmurs, rubs, or gallops.  ABDOMEN: Soft, nontender, nondistended. Bowel sounds present. No organomegaly or mass.  EXTREMITIES: No pedal edema, cyanosis, or clubbing.  NEUROLOGIC: Awake alert and oriented times 3  sensation intact. Gait not checked.  PSYCHIATRIC: The patient is alert and oriented x 3.  SKIN: No obvious rash, lesion, or ulcer.    LABORATORY PANEL:   CBC Recent Labs  Lab 01/26/19 0908  WBC 8.5  HGB 13.9  HCT 40.7  PLT 223   ------------------------------------------------------------------------------------------------------------------  Chemistries  Recent Labs  Lab 01/25/19 0414  01/26/19 0908  NA 133*   < > 133*  K 3.5   < > 3.9  CL 103   < > 104  CO2 23   < > 22  GLUCOSE 158*   < > 200*  BUN 29*   < > 18  CREATININE 1.58*   < > 1.07  CALCIUM 8.3*   < > 8.7*  AST 16  --   --   ALT 20  --   --   ALKPHOS 62  --   --   BILITOT 0.6  --   --    < > = values in this interval not displayed.   ------------------------------------------------------------------------------------------------------------------  Cardiac Enzymes No results for input(s): TROPONINI in the last 168 hours. ------------------------------------------------------------------------------------------------------------------  RADIOLOGY:  No results found.  EKG:   Orders placed or performed in visit on 10/25/18  .  EKG 12-Lead    ASSESSMENT AND PLAN:   #Hyperosmolar nonketotic hyperglycemia Clinically improving.  Off insulin drip   hemoglobin A1c 12.7  Metformin and Tradjenta is added to the regimen continue close monitoring And titrate medications Diabetic coordinator is following and providing   teaching  #Pseudohyponatremia secondary to hyperglycemia Sodium is 133 Off insulin drip  #History of GI bleed EGD done on 01/26/2019.  Normal findings according to GI.  Recommending outpatient follow-up with GI and okay to discharge patient from GI standpoint IV Protonix Hemoglobin 13.3   #AKI secondary to dehydration from hyperglycemia Improved with IV fluids  avoid nephrotoxins Creatinine 1.07 Resume metformin  #Essential hypertension-currently blood pressure is stable hold antihypertensives  #Obstructive sleep apnea CPAP nightly  DVT prophylaxis with Lovenox subcu renal dose adjusted    All the records are reviewed and case discussed with Care Management/Social Workerr. Management plans discussed with the patient, family and they are in agreement.  CODE STATUS:   TOTAL TIME TAKING CARE OF THIS PATIENT: 36  minutes.   POSSIBLE D/C IN 1  DAYS, DEPENDING ON CLINICAL CONDITION.  Note: This dictation was prepared with Dragon dictation along with smaller phrase technology. Any transcriptional errors that result from this process are unintentional.   Nicholes Mango M.D on 01/26/2019 at 1:06 PM  Between 7am to 6pm - Pager - [phone] After 6pm go to [email or web address] - password [email or web address] [facility]  [facility]  Office  [phone]  CC: Primary care physician; Steele Sizer, MD

## 2019-01-26 NOTE — Anesthesia Preprocedure Evaluation (Signed)
Anesthesia Evaluation  Patient identified by MRN, date of birth, ID band Patient awake    Reviewed: Allergy & Precautions, NPO status , Patient's Chart, lab work & pertinent test results  History of Anesthesia Complications Negative for: history of anesthetic complications  Airway Mallampati: I  TM Distance: >3 FB Neck ROM: Full    Dental  (+) Implants   Pulmonary sleep apnea , neg COPD, former smoker,    breath sounds clear to auscultation- rhonchi (-) wheezing      Cardiovascular hypertension, Pt. on medications + angina (last used NTG several months ago) + CAD (nonocclusive on last cath)   Rhythm:Regular Rate:Normal - Systolic murmurs and - Diastolic murmurs    Neuro/Psych negative neurological ROS  negative psych ROS   GI/Hepatic Neg liver ROS, GERD  ,  Endo/Other  diabetes, Oral Hypoglycemic Agents  Renal/GU negative Renal ROS     Musculoskeletal  (+) Arthritis ,   Abdominal (+) + obese,   Peds  Hematology negative hematology ROS (+)   Anesthesia Other Findings Past Medical History: No date: Allergy No date: CAD (coronary artery disease)     Comment: a. cardiac cath 05/30/2015: LM nl, mLAD 50%, LCx              minor irregs, RCA minor irregs, EF 55-65%, no               WMA, no MR/AS, nl LVEDP, recommend aggressive               med Rx No date: Degeneration of intervertebral disc of cervica* No date: Diabetes mellitus without complication ([place]) 0272: Hernia     Comment: 02/10/2012-RIH repair No date: Hyperlipidemia 2008: Hypertension 2012: Inguinal hernia without mention of obstruction* 2012: Obesity, unspecified 2012: Personal history of tobacco use, presenting ha* No date: Sleep apnea 5366: Umbilical hernia without mention of obstructio*   Reproductive/Obstetrics                             Anesthesia Physical  Anesthesia Plan  ASA: III  Anesthesia Plan: General    Post-op Pain Management:    Induction: Intravenous  PONV Risk Score and Plan: 2 and Propofol infusion and TIVA  Airway Management Planned: Natural Airway and Nasal Cannula  Additional Equipment:   Intra-op Plan:   Post-operative Plan:   Informed Consent: I have reviewed the patients History and Physical, chart, labs and discussed the procedure including the risks, benefits and alternatives for the proposed anesthesia with the patient or authorized representative who has indicated his/her understanding and acceptance.     Dental advisory given  Plan Discussed with: CRNA and Anesthesiologist  Anesthesia Plan Comments:         Anesthesia Quick Evaluation

## 2019-01-26 NOTE — Anesthesia Post-op Follow-up Note (Signed)
Anesthesia QCDR form completed.        

## 2019-01-26 NOTE — Progress Notes (Addendum)
Inpatient Diabetes Program Recommendations  AACE/ADA: New Consensus Statement on Inpatient Glycemic Control   Target Ranges:  Prepandial:   less than 140 mg/dL      Peak postprandial:   less than 180 mg/dL (1-2 hours)      Critically ill patients:  140 - 180 mg/dL  Results for [name] [name], [name] [name] (MRN [id number]) as of 01/26/2019 07:50  Ref. Range 01/26/2019 07:42  Glucose-Capillary Latest Ref Range: 70 - 99 mg/dL 170 (H)   Results for [name] [name], [name] [name] (MRN [id number]) as of 01/26/2019 07:37  Ref. Range 01/25/2019 09:02 01/25/2019 09:57 01/25/2019 11:55 01/25/2019 16:22 01/25/2019 19:59 01/26/2019 00:31 01/26/2019 04:08  Glucose-Capillary Latest Ref Range: 70 - 99 mg/dL 163 (H) 146 (H) 132 (H)  Novolog 3 units  Levemir 18 units 244 (H)  Novolog 3 units 243 (H)  Novolog 3 units 204 (H)  Novolog 3 units 214 (H)  Novolog 3 units  Results for [name] [name], [name] [name] (MRN [id number]) as of 01/26/2019 07:37  Ref. Range 11/22/2018 14:01 01/25/2019 04:14  Hemoglobin A1C Latest Ref Range: 4.8 - 5.6 % 6.7 (H) 12.7 (H)   Review of Glycemic Control  Diabetes history: DM2 Outpatient Diabetes medications:  Metformin 850 mg BID Current orders for Inpatient glycemic control: Novolog 0-9 units Q4H  Inpatient Diabetes Program Recommendations:  Insulin - Basal: Please consider ordering Lantus 20 units daily (starting today). Oral Agents: Once diet is resumed today, may want to consider ordering Tradjenta 5 mg daily and Metformin 500 mg BID. HgbA1C: A1C 12.7% on 01/25/19 indicating an average glucose of 318 mg/dl over the past 2-3 months.  Noted prior A1C 6.7% on 11/22/2018.   NOTE: Patient received Levemir 18 units at 12:32 pm on 01/25/19 and has received a total of Novolog 12 units since getting Levemir.  Noted patient is NPO for possible EGD today and current CBG 170 mg/dl.  Diabetes Coordinator spoke with patient on 01/25/19 and patient has a fear of needles and his wife will likely be giving him insulin injections if  prescribed. Patient's wife work during the day.  Anticipate patient will need at least basal insulin and perhaps could use additional oral DM medications to keep DM controlled. Once diet is resumed today, may want to consider ordering Metformin and Tradjenta while inpatient to determine impact on glucose.  Addendum 01/26/19_0 :15- Met with patient and his wife again regarding DM control and insulin. Patient's wife states that she administer an insulin injection to her husband and she felt very confident about administer insulin to him. Patient states he still feels he can't give himself an insulin injection but he says he may get to where he can in the future if he has to.  Discussed insulin as ordered and again reviewed Lantus and Novolog.  Also discussed Tradjenta and Metformin and how each oral DM medication as well as Lantus work for DM control.  Patient is agreeable to take Lantus along with oral DM medications for DM control as he does not feel he could use Novolog with meals since his wife has to work and he has a fear of needles.  Had patient's wife demonstrate how to use an insulin pen which she was successful with demonstration. Informed patient and his wife that an insulin pen starter kit would be ordered again (ordered on 1/29 but patient reports he did not receive yesterday). Encouraged them to review the insulin pen starter kit once it is received. Reviewed hypoglycemia along with treatment and asked patient to purchase glucose tablets  when he picks up medications from pharmacy after discharged.  Asked patient to check glucose 3-4 times per day and to keep a record which he needs to take to the doctor when following up. Explained that PCP can review glucose trends and determine if adjustments need to be made with DM medications. Patient and his wife verbalized understanding of information discussed and they report they have no questions at this time. At time of discharge, please provide Rx for:  glucose monitoring kit (#[id number]), Lantus SoloStar pen (#[id number]), insulin pen needles (#[id number]), and other oral DM medications being prescribed.      Thanks, Barnie Alderman, RN, MSN, CDE Diabetes Coordinator Inpatient Diabetes Program [phone] (Team Pager from 8am to 5pm)

## 2019-01-26 NOTE — Op Note (Signed)
[facility] Gastroenterology Patient Name: [name] [name] Procedure Date: 01/26/2019 11:29 AM MRN: [id number] Account #: [id number] Date of Birth: 06-Jan-1961 Admit Type: Inpatient Age: 58 Room: [facility] ENDO ROOM 2 Gender: Male Note Status: Finalized Procedure:            Upper GI endoscopy Indications:          Dyspepsia Providers:            Lin Landsman MD, MD Referring MD:         Herminio Commons (Referring MD) Medicines:            Monitored Anesthesia Care Complications:        No immediate complications. Estimated blood loss: None. Procedure:            Pre-Anesthesia Assessment:                       - Prior to the procedure, a History and Physical was                        performed, and patient medications and allergies were                        reviewed. The patient is competent. The risks and                        benefits of the procedure and the sedation options and                        risks were discussed with the patient. All questions                        were answered and informed consent was obtained.                        Patient identification and proposed procedure were                        verified by the physician, the nurse, the                        anesthesiologist, the anesthetist and the technician in                        the pre-procedure area in the procedure room in the                        endoscopy suite. Mental Status Examination: alert and                        oriented. Airway Examination: normal oropharyngeal                        airway and neck mobility. Respiratory Examination:                        clear to auscultation. CV Examination: normal.                        Prophylactic Antibiotics: The patient does not require  prophylactic antibiotics. Prior Anticoagulants: The                        patient has taken no previous anticoagulant or                        antiplatelet  agents. ASA Grade Assessment: III - A                        patient with severe systemic disease. After reviewing                        the risks and benefits, the patient was deemed in                        satisfactory condition to undergo the procedure. The                        anesthesia plan was to use monitored anesthesia care                        (MAC). Immediately prior to administration of                        medications, the patient was re-assessed for adequacy                        to receive sedatives. The heart rate, respiratory rate,                        oxygen saturations, blood pressure, adequacy of                        pulmonary ventilation, and response to care were                        monitored throughout the procedure. The physical status                        of the patient was re-assessed after the procedure.                       After obtaining informed consent, the endoscope was                        passed under direct vision. Throughout the procedure,                        the patient's blood pressure, pulse, and oxygen                        saturations were monitored continuously. The Endoscope                        was introduced through the mouth, and advanced to the                        second part of duodenum. The upper GI endoscopy was  accomplished without difficulty. The patient tolerated                        the procedure well. Findings:      The duodenal bulb and second portion of the duodenum were normal.      The entire examined stomach was normal. Biopsies were taken with a cold       forceps for Helicobacter pylori testing.      The cardia and gastric fundus were normal on retroflexion.      Esophagogastric landmarks were identified: the gastroesophageal junction       was found at 35 cm from the incisors.      The gastroesophageal junction and examined esophagus were normal. Impression:           -  Normal duodenal bulb and second portion of the                        duodenum.                       - Normal stomach. Biopsied.                       - Esophagogastric landmarks identified.                       - Normal gastroesophageal junction and esophagus. Recommendation:       - Return patient to hospital ward for ongoing care.                       - Diabetic (ADA) diet today.                       - Continue present medications.                       - Await pathology results.                       - Return to my office as previously scheduled. Procedure Code(s):    --- Professional ---                       [phone], Esophagogastroduodenoscopy, flexible, transoral;                        with biopsy, single or multiple Diagnosis Code(s):    --- Professional ---                       R10.13, Epigastric pain CPT copyright 2018 [employer]. All rights reserved. The codes documented in this report are preliminary and upon coder review may  be revised to meet current compliance requirements. Dr. Ulyess Mort Lin Landsman MD, MD 01/26/2019 12:01:10 PM This report has been signed electronically. Number of Addenda: 0 Note Initiated On: 01/26/2019 11:29 AM      [facility]

## 2019-01-27 LAB — GLUCOSE, CAPILLARY
Glucose-Capillary: 215 mg/dL — ABNORMAL HIGH (ref 70–99)
Glucose-Capillary: 198 mg/dL — ABNORMAL HIGH (ref 70–99)
Glucose-Capillary: 201 mg/dL — ABNORMAL HIGH (ref 70–99)

## 2019-01-27 MED ORDER — LINAGLIPTIN 5 MG PO TABS: 5.0000 mg | ORAL_TABLET | Freq: Every day | ORAL | 0 refills | Status: DC

## 2019-01-27 MED ORDER — PANTOPRAZOLE SODIUM 40 MG PO TBEC: 40.0000 mg | DELAYED_RELEASE_TABLET | Freq: Every day | ORAL | Status: DC

## 2019-01-27 MED ORDER — INSULIN GLARGINE 100 UNIT/ML SOLOSTAR PEN: 30.0000 [IU] | PEN_INJECTOR | Freq: Every day | SUBCUTANEOUS | 1 refills | Status: DC

## 2019-01-27 MED ORDER — BLOOD GLUCOSE MONITOR KIT: PACK | 0 refills | Status: DC

## 2019-01-27 MED ORDER — INSULIN GLARGINE 100 UNIT/ML ~~LOC~~ SOLN: 30.0000 [IU] | Freq: Every day | SUBCUTANEOUS | Status: DC

## 2019-01-27 MED ORDER — TRAMADOL HCL 50 MG PO TABS: 50.0000 mg | ORAL_TABLET | Freq: Two times a day (BID) | ORAL | 0 refills | Status: DC | PRN

## 2019-01-27 MED ORDER — INSULIN PEN NEEDLE 32G X 4 MM MISC: 30.0000 [IU] | Freq: Every day | 1 refills | Status: DC

## 2019-01-27 MED ORDER — METFORMIN HCL 500 MG PO TABS: 500.0000 mg | ORAL_TABLET | Freq: Two times a day (BID) | ORAL | 0 refills | Status: DC

## 2019-01-27 MED ORDER — INSULIN GLARGINE 100 UNIT/ML ~~LOC~~ SOLN
10.0000 [IU] | Freq: Once | SUBCUTANEOUS | Status: AC
  Administered 2019-01-27: 10 [IU] via SUBCUTANEOUS
  Filled 2019-01-27: qty 0.1

## 2019-01-27 MED ORDER — DOCUSATE SODIUM 100 MG PO CAPS: 100.0000 mg | ORAL_CAPSULE | Freq: Two times a day (BID) | ORAL | 0 refills | Status: DC | PRN

## 2019-01-27 NOTE — Progress Notes (Signed)
Inpatient Diabetes Program Recommendations  AACE/ADA: New Consensus Statement on Inpatient Glycemic Control   Target Ranges:  Prepandial:   less than 140 mg/dL      Peak postprandial:   less than 180 mg/dL (1-2 hours)      Critically ill patients:  140 - 180 mg/dL   Results for KITT, LEDET (MRN [id number]) as of 01/27/2019 07:23  Ref. Range 01/26/2019 07:42 01/26/2019 09:55 01/26/2019 13:06 01/26/2019 16:11 01/26/2019 20:01 01/26/2019 23:22 01/27/2019 04:49  Glucose-Capillary Latest Ref Range: 70 - 99 mg/dL 170 (H) 182 (H) 193 (H) 240 (H) 208 (H) 193 (H) 215 (H)   Review of Glycemic Control  Diabetes history: DM2 Outpatient Diabetes medications: Metformin 850 mg BID Current orders for Inpatient glycemic control: Lantus 20 units daily, Novolog 0-9 units Q4H, Tradjenta 5 mg daily, Metformin 500 mg BID  Inpatient Diabetes Program Recommendations:   Insulin - Basal: Please consider increasing Lantus to 30 units daily (starting today). Insulin-Correction: Please change frequency of CBGs to AC&HS and Novolog to 0-9 units TID with meals and Novolog 0-5 units QHS.  At time of discharge, please provide Rx for: glucose monitoring kit (#[id number]), Lantus SoloStar pen (#[id number]), insulin pen needles (#[id number]), and other oral DM medications being prescribed.  Thanks, Barnie Alderman, RN, MSN, CDE Diabetes Coordinator Inpatient Diabetes Program [phone] (Team Pager from 8am to 5pm)

## 2019-01-27 NOTE — Discharge Summary (Signed)
[name] [name] at [name] [name] NAME: [name] [name]    MR#:  [id number]  DATE OF BIRTH:  13-Jan-1961  DATE OF ADMISSION:  01/24/2019 ADMITTING PHYSICIAN: Nicholes Mango, MD  DATE OF DISCHARGE:  01/27/19   PRIMARY CARE PHYSICIAN: Steele Sizer, MD    ADMISSION DIAGNOSIS:  Hyperglycemia [R73.9] AKI (acute kidney injury) ([place]) [N17.9]  DISCHARGE DIAGNOSIS:  Hyperosmolar nonketotic hyperglycemia Uncontrolled diabetes mellitus  SECONDARY DIAGNOSIS:   Past Medical History:  Diagnosis Date  . Allergy   . Angina, class III ([place])   . Chronic constipation   . Degeneration of intervertebral disc of cervical region   . Diabetes mellitus without complication ([place])   . Diastolic dysfunction    a. 06/2017 Echo: EF 60-65%, no rwma, Gr1 DD.  Marland Kitchen Dyspnea   . GERD (gastroesophageal reflux disease)   . Hernia 1991   02/10/2012-RIH repair  . Hyperlipidemia   . Hypertension 2008  . Nerve root pain   . Neuropathy   . Non-obstructive CAD (coronary artery disease)    a. 05/30/2015 cath: LM nl, mLAD 50% (FFR 0.83), LCx minor irregs, RCA minor irregs, EF 55-65%-->Med Rx; b. 03/2016 Cath: LM nl LAD 45m D1/2/3 min irregs, LCX min irregs, OM1/2/3 nl, RCA min irregs, RPDA/RPAV/RPL1/RPL2 nl-->Med Rx.  . Obesity, unspecified 2012  . Personal history of tobacco use, presenting hazards to health 2012  . Polycythemia   . Recurrent Right Inguinal Hernia Repair 02/09/2011, [facility]07[facility] Sleep apnea    a. On CPAP;  b. 06/2017 Echo: no PAH.  .Marland KitchenUmbilical hernia without mention of obstruction or gangrene 02/09/2011   [facility] . Vitamin D deficiency     HOSPITAL COURSE:   HPI  CGurkirat [name] is a 58y.o. male with a known history of GERD and recent history of GI bleed, hypertension hyperlipidemia, non-insulin requiring diabetes mellitus.  Nonobstructive coronary artery disease, obesity came for outpatient EGD but unfortunately the procedure got canceled  as his blood sugar was at 700 and patient is sent over to the emergency department.  Patient denies any chest pain or shortness of breath.  Mom at bedside.  Patient was given 1 L fluid bolus and repeat Accu-Chek was greater than 600.  Hospitalist team is called to admit the patient patient is started on IV fluid boluses and insulin drip.  Patient reports that he is very thirsty and feeling dry with frequent urination.  Denies any chest pain or shortness of breath  #Hyperosmolar nonketotic hyperglycemia Clinically improving.  Off insulin drip   hemoglobin A1c 12.7  Metformin and Tradjenta is added to the regimen continue close monitoring And titrate medications Lantus dose increased to 30 units once daily Diabetic coordinator is following and provided  teaching.  Outpatient follow-up with lifestyle clinic as needed Will provide Lantus pens and glucometer kit with insulin pen needles  #Pseudohyponatremia secondary to hyperglycemia Sodium is 133 Off insulin drip  #History of GI bleed EGD done on 01/26/2019.  Normal findings according to GI.  Recommending outpatient follow-up with GI and okay to discharge patient from GI standpoint IV Protonix changed to p.o. PPI Hemoglobin 13.3   #AKI secondary to dehydration from hyperglycemia Improved with IV fluids  avoid nephrotoxins Creatinine 1.07 Resume metformin  #Essential hypertension-currently blood pressure is stable hold antihypertensive losartan hydrochlorothiazide Continue Coreg  #Obstructive sleep apnea CPAP nightly  DVT prophylaxis with Lovenox subcu renal dose adjusted  Plan of care discussed  in detail with the patient and family members at bedside.  They all verbalized understanding of the plan.  Discharge patient home  DISCHARGE CONDITIONS:   Stable  CONSULTS OBTAINED:     PROCEDURES EGD DRUG ALLERGIES:   Allergies  Allergen Reactions  . Gabapentin Other (See Comments)    Groggy-Mood Changes  . Latex Rash  .  Penicillins Hives and Rash    Has patient had a PCN reaction causing immediate rash, facial/tongue/throat swelling, SOB or lightheadedness with hypotension: Yes Has patient had a PCN reaction causing severe rash involving mucus membranes or skin necrosis: No Has patient had a PCN reaction that required hospitalization No Has patient had a PCN reaction occurring within the last 10 years: No If all of the above answers are "NO", then may proceed with Cephalosporin use.     DISCHARGE MEDICATIONS:   Allergies as of 01/27/2019      Reactions   Gabapentin Other (See Comments)   Groggy-Mood Changes   Latex Rash   Penicillins Hives, Rash   Has patient had a PCN reaction causing immediate rash, facial/tongue/throat swelling, SOB or lightheadedness with hypotension: Yes Has patient had a PCN reaction causing severe rash involving mucus membranes or skin necrosis: No Has patient had a PCN reaction that required hospitalization No Has patient had a PCN reaction occurring within the last 10 years: No If all of the above answers are "NO", then may proceed with Cephalosporin use.      Medication List    STOP taking these medications   ACCU-CHEK FASTCLIX LANCET Kit   ACCU-CHEK SMARTVIEW test strip Generic drug:  glucose blood   accu-chek soft touch lancets   losartan-hydrochlorothiazide 100-12.5 MG tablet Commonly known as:  HYZAAR     TAKE these medications   aspirin 81 MG tablet Take 81 mg by mouth daily.   atorvastatin 80 MG tablet Commonly known as:  LIPITOR TAKE 1 TABLET BY MOUTH EVERY DAY   blood glucose meter kit and supplies Kit Dispense based on patient and insurance preference. Use up to four times daily as directed. (FOR ICD-9 250.00, 250.01).   carvedilol 12.5 MG tablet Commonly known as:  COREG TAKE 1 TABLET BY MOUTH TWICE DAILY   cetirizine 10 MG tablet Commonly known as:  ZYRTEC Take 10 mg by mouth daily as needed for allergies.   cyanocobalamin 1000 MCG/ML  injection Commonly known as:  (VITAMIN B-12) Inject 1,000 mcg into the muscle every 30 (thirty) days.   diclofenac sodium 1 % Gel Commonly known as:  VOLTAREN Apply 4 g topically 2 (two) times daily as needed.   dicyclomine 10 MG capsule Commonly known as:  BENTYL TAKE 1 CAPSULE BY MOUTH THREE TIMES A DAY BEFORE MEALS   docusate sodium 100 MG capsule Commonly known as:  COLACE Take 1 capsule (100 mg total) by mouth 2 (two) times daily as needed for mild constipation.   FLECTOR 1.3 % Ptch Generic drug:  diclofenac Place 1 patch onto the skin daily as needed (pain).   fluticasone 50 MCG/ACT nasal spray Commonly known as:  FLONASE Place 2 sprays into the nose daily as needed for allergies or rhinitis.   Insulin Glargine 100 UNIT/ML Solostar Pen Commonly known as:  LANTUS Inject 30 Units into the skin daily.   Insulin Pen Needle 32G X 4 MM Misc 30 Units by Does not apply route daily.   linagliptin 5 MG Tabs tablet Commonly known as:  TRADJENTA Take 1 tablet (5 mg total) by mouth  daily. Start taking on:  January 28, 2019   metFORMIN 500 MG tablet Commonly known as:  GLUCOPHAGE Take 1 tablet (500 mg total) by mouth 2 (two) times daily with a meal. What changed:    medication strength  See the new instructions.   NITROSTAT 0.4 MG SL tablet Generic drug:  nitroGLYCERIN place 1 tablet under the tongue every 5 minutes for UP TO 3 doses if needed for angina as directed by prescriber   nortriptyline 10 MG capsule Commonly known as:  PAMELOR take 1 capsule at bedtime daily   omeprazole 20 MG capsule Commonly known as:  PRILOSEC Take 1 capsule (20 mg total) by mouth every morning.   ranolazine 1000 MG SR tablet Commonly known as:  RANEXA TAKE 1 TABLET BY MOUTH TWICE DAILY   selenium sulfide 2.5 % shampoo Commonly known as:  SELSUN Apply 1 application topically daily as needed for irritation.   sildenafil 20 MG tablet Commonly known as:  REVATIO 1 TO 2 TABLETS BY  MOUTH EVERY DAY   traMADol 50 MG tablet Commonly known as:  ULTRAM Take 1 tablet (50 mg total) by mouth every 12 (twelve) hours as needed for moderate pain.   venlafaxine 75 MG tablet Commonly known as:  EFFEXOR Take 1 tablet by mouth daily.        DISCHARGE INSTRUCTIONS:   Follow-up with primary care physician in 3 days-PCP to refer the patient to endocrinologist Follow-up with gastroenterology Dr. Marius Ditch in 1 to 2 weeks  DIET:  Diabetic diet  DISCHARGE CONDITION:  Fair  ACTIVITY:  Activity as tolerated  OXYGEN:  Home Oxygen: No.   Oxygen Delivery: room air  DISCHARGE LOCATION:  home   If you experience worsening of your admission symptoms, develop shortness of breath, life threatening emergency, suicidal or homicidal thoughts you must seek medical attention immediately by calling 911 or calling your MD immediately  if symptoms less severe.  You Must read complete instructions/literature along with all the possible adverse reactions/side effects for all the Medicines you take and that have been prescribed to you. Take any new Medicines after you have completely understood and accpet all the possible adverse reactions/side effects.   Please note  You were cared for by a hospitalist during your hospital stay. If you have any questions about your discharge medications or the care you received while you were in the hospital after you are discharged, you can call the unit and asked to speak with the hospitalist on call if the hospitalist that took care of you is not available. Once you are discharged, your primary care physician will handle any further medical issues. Please note that NO REFILLS for any discharge medications will be authorized once you are discharged, as it is imperative that you return to your primary care physician (or establish a relationship with a primary care physician if you do not have one) for your aftercare needs so that they can reassess your need for  medications and monitor your lab values.     Today  Chief Complaint  Patient presents with  . Hyperglycemia   Patient is doing fine.  No complaints.  Very motivated to follow carb controlled diet.  Family members at bedside.  ROS:  CONSTITUTIONAL: Denies fevers, chills. Denies any fatigue, weakness.  EYES: Denies blurry vision, double vision, eye pain. EARS, NOSE, THROAT: Denies tinnitus, ear pain, hearing loss. RESPIRATORY: Denies cough, wheeze, shortness of breath.  CARDIOVASCULAR: Denies chest pain, palpitations, edema.  GASTROINTESTINAL: Denies nausea, vomiting,  diarrhea, abdominal pain. Denies bright red blood per rectum. GENITOURINARY: Denies dysuria, hematuria. ENDOCRINE: Denies nocturia or thyroid problems. HEMATOLOGIC AND LYMPHATIC: Denies easy bruising or bleeding. SKIN: Denies rash or lesion. MUSCULOSKELETAL: Denies pain in neck, back, shoulder, knees, hips or arthritic symptoms.  NEUROLOGIC: Denies paralysis, paresthesias.  PSYCHIATRIC: Denies anxiety or depressive symptoms.   VITAL SIGNS:  Blood pressure 127/77, pulse 61, temperature 98.4 F (36.9 C), temperature source Oral, resp. rate 20, height _0  (1.803 m), weight 114.5 kg, SpO2 97 %.  I/O:    Intake/Output Summary (Last 24 hours) at 01/27/2019 1213 Last data filed at 01/26/2019 1700 Gross per 24 hour  Intake 361 ml  Output 300 ml  Net 61 ml    PHYSICAL EXAMINATION:  GENERAL:  58 y.o.-year-old patient lying in the bed with no acute distress.  EYES: Pupils equal, round, reactive to light and accommodation. No scleral icterus. Extraocular muscles intact.  HEENT: Head atraumatic, normocephalic. Oropharynx and nasopharynx clear.  NECK:  Supple, no jugular venous distention. No thyroid enlargement, no tenderness.  LUNGS: Normal breath sounds bilaterally, no wheezing, rales,rhonchi or crepitation. No use of accessory muscles of respiration.  CARDIOVASCULAR: S1, S2 normal. No murmurs, rubs, or gallops.   ABDOMEN: Soft, non-tender, non-distended. Bowel sounds present.  EXTREMITIES: No pedal edema, cyanosis, or clubbing.  NEUROLOGIC: Cranial nerves II through XII are intact. Muscle strength 5/5 in all extremities. Sensation intact. Gait not checked.  PSYCHIATRIC: The patient is alert and oriented x 3.  SKIN: No obvious rash, lesion, or ulcer.   DATA REVIEW:   CBC Recent Labs  Lab 01/26/19 0908  WBC 8.5  HGB 13.9  HCT 40.7  PLT 223    Chemistries  Recent Labs  Lab 01/25/19 0414  01/26/19 0908  NA 133*   < > 133*  K 3.5   < > 3.9  CL 103   < > 104  CO2 23   < > 22  GLUCOSE 158*   < > 200*  BUN 29*   < > 18  CREATININE 1.58*   < > 1.07  CALCIUM 8.3*   < > 8.7*  AST 16  --   --   ALT 20  --   --   ALKPHOS 62  --   --   BILITOT 0.6  --   --    < > = values in this interval not displayed.    Cardiac Enzymes No results for input(s): TROPONINI in the last 168 hours.  Microbiology Results  Results for orders placed or performed during the hospital encounter of 01/24/19  MRSA PCR Screening     Status: None   Collection Time: 01/24/19  6:08 PM  Result Value Ref Range Status   MRSA by PCR NEGATIVE NEGATIVE Final    Comment:        The GeneXpert MRSA Assay (FDA approved for NASAL specimens only), is one component of a comprehensive MRSA colonization surveillance program. It is not intended to diagnose MRSA infection nor to guide or monitor treatment for MRSA infections. Performed at [facility], [address], [place], [place] [address]     RADIOLOGY:  No results found.  EKG:   Orders placed or performed in visit on 10/25/18  . EKG 12-Lead      Management plans discussed with the patient, family and they are in agreement.  CODE STATUS:     Code Status Orders  (From admission, onward)  Start     Ordered   01/24/19 1948  Full code  Continuous     01/24/19 1950        Code Status History    Date Active Date Inactive Code  Status Order ID Comments User Context   01/24/2019 1727 01/24/2019 1950 Full Code [id number]  Nicholes Mango, MD ED   05/30/2015 0938 05/30/2015 1532 Full Code [id number]  Wellington Hampshire, MD Inpatient    Advance Directive Documentation     Most Recent Value  Type of Advance Directive  Out of facility DNR (pink MOST or yellow form)  Pre-existing out of facility DNR order (yellow form or pink MOST form)  -  "MOST" Form in Place?  -      TOTAL TIME TAKING CARE OF THIS PATIENT: 45  minutes.   Note: This dictation was prepared with Dragon dictation along with smaller phrase technology. Any transcriptional errors that result from this process are unintentional.   _0 @  on 01/27/2019 at 12:13 PM  Between 7am to 6pm - Pager - [phone]  After 6pm go to [email or web address] - password [email or web address] [facility]  [facility]  Office  [phone]  CC: Primary care physician; Steele Sizer, MD

## 2019-01-27 NOTE — Progress Notes (Signed)
PHARMACIST - PHYSICIAN COMMUNICATION  DR:   [name] [name]  CONCERNING: IV to Oral Route Change Policy  RECOMMENDATION: This patient is receiving pantoprazole by the intravenous route.  Based on criteria approved by the Pharmacy and Therapeutics Committee, the intravenous medication(s) is/are being converted to the equivalent oral dose form(s).   DESCRIPTION: These criteria include:  The patient is eating (either orally or via tube) and/or has been taking other orally administered medications for a least 24 hours  The patient has no evidence of active gastrointestinal bleeding or impaired GI absorption (gastrectomy, short bowel, patient on TNA or NPO).  If you have questions about this conversion, please contact the Pharmacy Department  []   [phone] )  [name] [name] [x]   [phone] )  [facility] []   [phone] )  [name] [name] []   [phone] )  [facility] []   [phone] )  [place], [facility] 01/27/2019 8:44 AM

## 2019-01-27 NOTE — Progress Notes (Signed)
Patient and attending confirmed that patient had Diabetes education. Patient and his wife verbalized understanding on administration instructions.

## 2019-01-27 NOTE — Discharge Instructions (Signed)
Follow-up with primary care physician in 3 days-PCP to refer the patient to endocrinologist Follow-up with gastroenterology Dr. Marius Ditch in 1 to 2 weeks

## 2019-01-30 ENCOUNTER — Encounter: Payer: Self-pay | Admitting: Gastroenterology

## 2019-01-30 ENCOUNTER — Telehealth: Payer: Self-pay

## 2019-01-30 ENCOUNTER — Encounter: Payer: Self-pay | Admitting: Family Medicine

## 2019-01-30 NOTE — Telephone Encounter (Signed)
Copied from [place] [id number]. Topic: Appointment Scheduling - Scheduling Inquiry for Clinic >> Jan 30, 2019  8:52 AM Scherrie Gerlach wrote: Reason for CRM: pt needs hospitla fup with Dr Ancil Boozer with 3 days per pt's AVS.   Please advise on schedule. Thank you! >> Jan 30, 2019  9:19 AM [place], Marland Kitchen wrote: Please advise where we can schedule pt at   She has an opening on 02/01/2019 at 11:00.

## 2019-01-30 NOTE — Telephone Encounter (Signed)
Transition Care Management Follow-up Telephone Call  Date of discharge and from where: 01/27/19 [facility]  How have you been since you were released from the hospital? Pt states he has been doing okay, blood sugar at hospital was over 700 and since he's been home more stable ranging from 180-220.  Any questions or concerns? Yes  - AVS states to stop losartan/hctz and pt feels this is the only thing that keeps his blood pressure down. Advised to discuss with Dr. Ancil Boozer. He also is in need of a new meter and suplies and took his prescription for testing supplies to the pharmacy but was told it could take a few days to fill. Advised pt to let us know if any issues or if we need to contact the pharmacy.    Items Reviewed:  Did the pt receive and understand the discharge instructions provided? Yes   Medications obtained and verified? Yes   Any new allergies since your discharge? No   Dietary orders reviewed? Yes  Do you have support at home? Yes   Functional Questionnaire: (I = Independent and D = Dependent) ADLs: I  Bathing/Dressing- I  Meal Prep- I  Eating- I  Maintaining continence- I  Transferring/Ambulation- I  Managing Meds- I  Follow up appointments reviewed:   PCP Hospital f/u appt confirmed? Yes  Scheduled to see Dr. Ancil Boozer on 02/02/19  @ 11:40.  [facility] f/u appt confirmed? Yes  Scheduled to see Dr. Marius Ditch on 02/01/19  Are transportation arrangements needed? No   If their condition worsens, is the pt aware to call PCP or go to the Emergency Dept.? Yes  Was the patient provided with contact information for the PCP's office or ED? Yes  Was to pt encouraged to call back with questions or concerns? Yes

## 2019-01-31 LAB — BLOOD GAS, VENOUS
Acid-base deficit: 2.6 mmol/L — ABNORMAL HIGH (ref 0.0–2.0)
Bicarbonate: 23.2 mmol/L (ref 20.0–28.0)
FIO2: 0.21
O2 Saturation: 40.7 %
PATIENT TEMPERATURE: 37
pCO2, Ven: 43 mmHg — ABNORMAL LOW (ref 44.0–60.0)
pH, Ven: 7.34 (ref 7.250–7.430)

## 2019-01-31 LAB — SURGICAL PATHOLOGY

## 2019-02-01 ENCOUNTER — Ambulatory Visit: Payer: Medicaid Other | Admitting: Gastroenterology

## 2019-02-01 ENCOUNTER — Encounter: Payer: Self-pay | Admitting: Gastroenterology

## 2019-02-01 VITALS — BP 120/76 | HR 63 | Resp 16 | Ht 71.0 in | Wt 258.0 lb

## 2019-02-01 DIAGNOSIS — K625 Hemorrhage of anus and rectum: Secondary | ICD-10-CM

## 2019-02-01 DIAGNOSIS — R1013 Epigastric pain: Secondary | ICD-10-CM | POA: Diagnosis not present

## 2019-02-01 NOTE — Patient Instructions (Signed)

## 2019-02-01 NOTE — Progress Notes (Signed)
[name] Darby, MD [address]  [place]  [place], [place] [address]  Main: [phone]  Fax: [phone]    Gastroenterology Consultation  Referring Provider:     Steele Sizer, MD Primary Care Physician:  Steele Sizer, MD Primary Gastroenterologist:  Dr. Vicente Males Reason for Consultation: Rectal bleeding  And dyspepsia        HPI:   BARRY FAIRCLOTH is a 58 y.o. African-American male referred by Dr. Steele Sizer, MD  for consultation & management of rectal bleeding.  Patient was seen by Dr. Vicente Males for rectal bleeding in the past.  And, he was found to have large hemorrhoids.  He subsequently underwent left lateral hemorrhoidectomy by Dr. Hampton Abbot in 03/2017. Patient reports that he has been noticing recurrence of bright red bleeding per rectum, dripping and on wiping.  He reports alternating episodes of 1 week diarrhea followed by constipation for which he takes stool softeners.  He reports upper abdominal pain associated with significant abdominal bloating.  He generally has lower abdominal discomfort.  He used to drink alcohol now replaced with sodas and Gatorade in last 3 to 4 weeks.  He lost about 10 pounds in last 1 month which he is worried about.  He is accompanied by his wife today.  He does not smoke cigarettes.  His hemoglobin is normal. CT in 02/2018 with normal hepato-pancreatic or biliary pathology  Follow-up visit 02/01/2019 Patient was hospitalized to [facility] last week due to DKA on the day of EGD.  EGD was canceled and admitted to the hospital.  After his DKA resolved, EGD was performed prior to discharge.  It was unremarkable including pathology.  His hemoglobin A1c was 13.3, started on insulin.  He reports feeling significantly better.  His blood sugars are under 200s.  He also reports minimal rectal bleeding and he thinks it is from passing hard stools.  He took Dulcolax in the hospital and he stools on a regular with minimal bleeding.  He would like to defer hemorrhoid  ligation today.  NSAIDs: None  Antiplts/Anticoagulants/Anti thrombotics: None  GI Procedures:  EGD 01/26/2019 - Normal duodenal bulb and second portion of the duodenum. - Normal stomach. Biopsied. - Esophagogastric landmarks identified. - Normal gastroesophageal junction and esophagus.  DIAGNOSIS:  A. STOMACH; COLD BIOPSY:  - ANTRAL MUCOSA WITH MILD NON-SPECIFIC CHRONIC AND FOCAL ACTIVE  GASTRITIS.  - UNREMARKABLE OXYNTIC MUCOSA.  - NEGATIVE FOR HELICOBACTER; IHC STAIN EXAMINED.  - NEGATIVE FOR INTESTINAL METAPLASIA, DYSPLASIA, AND MALIGNANCY.   Colonoscopy in 2017 and 2018 by Dr. Vicente Males - Preparation of the colon was poor. - Stool in the sigmoid colon. - No specimens collected.  - Three 5 to 7 mm polyps in the transverse colon, removed with a cold snare. Resected and retrieved. - One 3 mm polyp in the ascending colon, removed with a cold biopsy forceps. Resected and retrieved. - Non-bleeding internal hemorrhoids. - The examination was otherwise normal on direct and retroflexion views.  DIAGNOSIS:  A. COLON POLYP, ASCENDING; COLD BIOPSY:  - TUBULAR ADENOMA.  - NEGATIVE FOR HIGH-GRADE DYSPLASIA AND MALIGNANCY.   B. COLON POLYP 2, TRANSVERSE; COLD SNARE:  - TUBULAR ADENOMAS (2), NEGATIVE FOR HIGH-GRADE DYSPLASIA AND  MALIGNANCY.  - POLYPOID COLONIC MUCOSA WITH HEMORRHAGE OF THE LAMINA PROPRIA (1).    Past Medical History:  Diagnosis Date  . Allergy   . Angina, class III ([place])   . Chronic constipation   . Degeneration of intervertebral disc of cervical region   . Diabetes mellitus  without complication ([place])   . Diastolic dysfunction    a. 06/2017 Echo: EF 60-65%, no rwma, Gr1 DD.  Marland Kitchen Dyspnea   . GERD (gastroesophageal reflux disease)   . Hernia 1991   02/10/2012-RIH repair  . Hyperlipidemia   . Hypertension 2008  . Nerve root pain   . Neuropathy   . Non-obstructive CAD (coronary artery disease)    a. 05/30/2015 cath: LM nl, mLAD 50% (FFR 0.83), LCx minor irregs,  RCA minor irregs, EF 55-65%-->Med Rx; b. 03/2016 Cath: LM nl LAD 41m D1/2/3 min irregs, LCX min irregs, OM1/2/3 nl, RCA min irregs, RPDA/RPAV/RPL1/RPL2 nl-->Med Rx.  . Obesity, unspecified 2012  . Personal history of tobacco use, presenting hazards to health 2012  . Polycythemia   . Recurrent Right Inguinal Hernia Repair 02/09/2011, 04/07/2013   [facility] . Sleep apnea    a. On CPAP;  b. 06/2017 Echo: no PAH.  .Marland KitchenUmbilical hernia without mention of obstruction or gangrene 02/09/2011   [facility] . Vitamin D deficiency     Past Surgical History:  Procedure Laterality Date  . BACK SURGERY  12/2008   X2-LUMBAR  . CARDIAC CATHETERIZATION N/A 05/30/2015   Procedure: Left Heart Cath;  Surgeon: MWellington Hampshire MD;  [facility];  Service: Cardiovascular;  Laterality: N/A;  . CARDIAC CATHETERIZATION N/A 04/16/2016   Procedure: Left Heart Cath and Coronary Angiography;  Surgeon: MWellington Hampshire MD;  [facility];  Service: Cardiovascular;  Laterality: N/A;  . COLONOSCOPY  2014   Dr. SJamal Collin . COLONOSCOPY WITH PROPOFOL N/A 12/25/2016   Procedure: COLONOSCOPY WITH PROPOFOL;  Surgeon: KJonathon Bellows MD;  Location: [facility];  Service: Endoscopy;  Laterality: N/A;  . COLONOSCOPY WITH PROPOFOL N/A 01/29/2017   Procedure: COLONOSCOPY WITH PROPOFOL;  Surgeon: KJonathon Bellows MD;  Location: [facility];  Service: Endoscopy;  Laterality: N/A;  . ESOPHAGOGASTRODUODENOSCOPY (EGD) WITH PROPOFOL N/A 01/26/2019   Procedure: ESOPHAGOGASTRODUODENOSCOPY (EGD) WITH PROPOFOL;  Surgeon: VLin Landsman MD;  [facility];  Service: Gastroenterology;  Laterality: N/A;  . EVALUATION UNDER ANESTHESIA WITH HEMORRHOIDECTOMY N/A 02/24/2017   Procedure: EXAM UNDER ANESTHESIA WITH POSSIBLE EXCISION OF INTERNAL HEMORRHOIDS;  Surgeon: JOlean Ree MD;  Location: [facility];  Service: General;  Laterality: N/A;  . FISSURECTOMY  02/24/2017   Procedure: FISSURECTOMY;   Surgeon: JOlean Ree MD;  Location: [facility];  Service: General;;  . HERNIA REPAIR Right 1991  . INGUINAL HERNIA REPAIR Right 2012   Dr SJamal Collin . INGUINAL HERNIA REPAIR Right 2014   Dr SJamal Collin    Current Outpatient Medications:  .  aspirin 81 MG tablet, Take 81 mg by mouth daily., Disp: , Rfl:  .  atorvastatin (LIPITOR) 80 MG tablet, TAKE 1 TABLET BY MOUTH EVERY DAY, Disp: 90 tablet, Rfl: 0 .  blood glucose meter kit and supplies KIT, Dispense based on patient and insurance preference. Use up to four times daily as directed. (FOR ICD-9 250.00, 250.01)., Disp: 1 each, Rfl: 0 .  carvedilol (COREG) 12.5 MG tablet, TAKE 1 TABLET BY MOUTH TWICE DAILY, Disp: 60 tablet, Rfl: 2 .  cetirizine (ZYRTEC) 10 MG tablet, Take 10 mg by mouth daily as needed for allergies. , Disp: , Rfl: 1 .  cyanocobalamin (,VITAMIN B-12,) 1000 MCG/ML injection, Inject 1,000 mcg into the muscle every 30 (thirty) days., Disp: , Rfl:  .  diclofenac sodium (VOLTAREN) 1 % GEL, Apply 4 g topically 2 (two) times daily as  needed., Disp: 100 g, Rfl: 5 .  dicyclomine (BENTYL) 10 MG capsule, TAKE 1 CAPSULE BY MOUTH THREE TIMES A DAY BEFORE MEALS, Disp: 90 capsule, Rfl: 0 .  docusate sodium (COLACE) 100 MG capsule, Take 1 capsule (100 mg total) by mouth 2 (two) times daily as needed for mild constipation., Disp: 10 capsule, Rfl: 0 .  FLECTOR 1.3 % PTCH, Place 1 patch onto the skin daily as needed (pain)., Disp: 30 patch, Rfl: 5 .  fluticasone (FLONASE) 50 MCG/ACT nasal spray, Place 2 sprays into the nose daily as needed for allergies or rhinitis. , Disp: , Rfl:  .  Insulin Glargine (LANTUS) 100 UNIT/ML Solostar Pen, Inject 30 Units into the skin daily., Disp: 5 pen, Rfl: 1 .  Insulin Pen Needle 32G X 4 MM MISC, 30 Units by Does not apply route daily., Disp: 90 each, Rfl: 1 .  linagliptin (TRADJENTA) 5 MG TABS tablet, Take 1 tablet (5 mg total) by mouth daily., Disp: 30 tablet, Rfl: 0 .  metFORMIN (GLUCOPHAGE) 500 MG tablet, Take 1  tablet (500 mg total) by mouth 2 (two) times daily with a meal., Disp: 60 tablet, Rfl: 0 .  NITROSTAT 0.4 MG SL tablet, place 1 tablet under the tongue every 5 minutes for UP TO 3 doses if needed for angina as directed by prescriber, Disp: 25 tablet, Rfl: 1 .  nortriptyline (PAMELOR) 10 MG capsule, take 1 capsule at bedtime daily, Disp: , Rfl: 1 .  omeprazole (PRILOSEC) 20 MG capsule, Take 1 capsule (20 mg total) by mouth every morning., Disp: 30 capsule, Rfl: 5 .  ranolazine (RANEXA) 1000 MG SR tablet, TAKE 1 TABLET BY MOUTH TWICE DAILY, Disp: 60 tablet, Rfl: 2 .  selenium sulfide (SELSUN) 2.5 % shampoo, Apply 1 application topically daily as needed for irritation., Disp: 118 mL, Rfl: 12 .  sildenafil (REVATIO) 20 MG tablet, 1 TO 2 TABLETS BY MOUTH EVERY DAY, Disp: , Rfl: 0 .  traMADol (ULTRAM) 50 MG tablet, Take 1 tablet (50 mg total) by mouth every 12 (twelve) hours as needed for moderate pain., Disp: 10 tablet, Rfl: 0 .  venlafaxine (EFFEXOR) 75 MG tablet, Take 1 tablet by mouth daily., Disp: , Rfl: 3   Family History  Problem Relation Age of Onset  . Heart Problems Mother   . Heart Problems Father 29       myocardial infarction  . Mental illness Brother      Social History   Tobacco Use  . Smoking status: Former Smoker    Years: 2.00    Types: Cigars    Start date: 12/28/2002    Last attempt to quit: 06/04/2005    Years since quitting: 13.6  . Smokeless tobacco: Never Used  Substance Use Topics  . Alcohol use: Yes    Alcohol/week: 1.0 standard drinks    Types: 1 Glasses of wine per week    Comment: RARE BEER  . Drug use: No    Allergies as of 02/01/2019 - Review Complete 02/01/2019  Allergen Reaction Noted  . Gabapentin Other (See Comments) 04/16/2015  . Latex Rash 06/20/2015  . Penicillins Hives and Rash 03/23/2013    Review of Systems:    All systems reviewed and negative except where noted in HPI.   Physical Exam:  BP 120/76 (BP Location: Left Arm, Patient  Position: Sitting, Cuff Size: Large)   Pulse 63   Resp 16   Ht '5\' 11"'  (1.803 m)   Wt 258 lb (117 kg)  BMI 35.98 kg/m  No LMP for male patient.  General:   Alert,  Well-developed, well-nourished, pleasant and cooperative in NAD Head:  Normocephalic and atraumatic. Eyes:  Sclera clear, no icterus.   Conjunctiva pink. Ears:  Normal auditory acuity. Nose:  No deformity, discharge, or lesions. Mouth:  No deformity or lesions,oropharynx pink & moist. Neck:  Supple; no masses or thyromegaly. Lungs:  Respirations even and unlabored.  Clear throughout to auscultation.   No wheezes, crackles, or rhonchi. No acute distress. Heart:  Regular rate and rhythm; no murmurs, clicks, rubs, or gallops. Abdomen:  Normal bowel sounds. Soft, non-tender and non-distended without masses, hepatosplenomegaly or hernias noted.  No guarding or rebound tenderness.   Rectal: Not performed Msk:  Symmetrical without gross deformities. Good, equal movement & strength bilaterally. Pulses:  Normal pulses noted. Extremities:  No clubbing or edema.  No cyanosis. Neurologic:  Alert and oriented x3;  grossly normal neurologically. Skin:  Intact without significant lesions or rashes. No jaundice. Psych:  Alert and cooperative. Normal mood and affect.  Imaging Studies: Reviewed  Assessment and Plan:   BRENNDEN MASTEN is a 58 y.o. African-American male with metabolic syndrome seen for follow-up of 1 month history of upper abdominal pain associated with significant bloating, weight loss, rectal bleeding and alternating episodes of constipation and diarrhea.  Part of his symptoms are probably secondary to intake of carbonated beverages.  EGD was unremarkable.  His dyspepsia symptoms are secondary to DKA and uncontrolled diabetes. Strongly advised him to continue to avoid all carbonated beverages, artificial sweeteners  Dyspepsia: Minimal symptoms EGD unremarkable  Rectal bleeding: Minimal Maintain high-fiber diet,  information provided Avoid laxatives, recommend MiraLAX 1-2 times daily Defer hemorrhoid ligation at this time  Tubular adenomas of colon Recommend surveillance colonoscopy in 01/2020   Follow up as needed   [name] Darby, MD

## 2019-02-02 ENCOUNTER — Ambulatory Visit: Payer: Medicaid Other | Admitting: Family Medicine

## 2019-02-02 ENCOUNTER — Encounter: Payer: Self-pay | Admitting: Family Medicine

## 2019-02-02 VITALS — BP 108/72 | HR 84 | Temp 97.6°F | Resp 16 | Ht 71.0 in | Wt 254.0 lb

## 2019-02-02 DIAGNOSIS — E1110 Type 2 diabetes mellitus with ketoacidosis without coma: Secondary | ICD-10-CM | POA: Diagnosis not present

## 2019-02-02 DIAGNOSIS — E1129 Type 2 diabetes mellitus with other diabetic kidney complication: Secondary | ICD-10-CM

## 2019-02-02 DIAGNOSIS — E1100 Type 2 diabetes mellitus with hyperosmolarity without nonketotic hyperglycemic-hyperosmolar coma (NKHHC): Secondary | ICD-10-CM

## 2019-02-02 DIAGNOSIS — N179 Acute kidney failure, unspecified: Secondary | ICD-10-CM

## 2019-02-02 DIAGNOSIS — Z09 Encounter for follow-up examination after completed treatment for conditions other than malignant neoplasm: Secondary | ICD-10-CM

## 2019-02-02 DIAGNOSIS — R809 Proteinuria, unspecified: Secondary | ICD-10-CM | POA: Diagnosis not present

## 2019-02-02 DIAGNOSIS — I10 Essential (primary) hypertension: Secondary | ICD-10-CM | POA: Diagnosis not present

## 2019-02-02 NOTE — Addendum Note (Signed)
Addended by: Steele Sizer F on: 02/02/2019 01:16 PM   Modules accepted: Level of Service

## 2019-02-02 NOTE — Progress Notes (Signed)
Name: [name] [name]   MRN: [id number]    DOB: 06/28/1961   Date:02/02/2019       Progress Note  Subjective  Chief Complaint  Chief Complaint  Patient presents with  . Hospitalization Follow-up    Needs refill on Flector and Acid Reflux j  . Hyperglycemia    Went to get a Upper GI and was in cold sweats- Sugar was over 700's-went to ER  . Diabetes    Checks BS four times daily  [facility] discharge: he was fasting to have EGD on 01/27/2019, he was at [facility] around 11:30 am and felt very diaphoretic, his shirt was soaked, felt dizzy, very drained. His mother drove him to have the EGD and glucose was over 700 and he was confused, he was sent to [facility] and had a direct admission for AKI and hyperosmolar nonketotic hyperglycemia. He was given IV fluids and started on insulin drip. He was discharged home three days later on lantus 30 units, metformin lower dose at 500 mg BID , and also on Trajenta. He is tolerating medication well. Glucose at home is above 140 fasting but below 250, three times in the 150's. He denies polyphagia, polydipsia or polyuria. He states he has gained weight since hospital visit. He was having a lot of symptoms of uncontrolled diabetes prior admission. A1C went from 6.7% to 12.7% in 2 months. He gained weight and BMI is above 35 with co-morbidities.    Patient Active Problem List   Diagnosis Date Noted  . Hyperglycemia 01/24/2019  . Groin strain 03/30/2018  . Seizure ([place]) 02/22/2018  . Mild cognitive impairment 11/17/2017  . Radiculopathy of cervical region 11/17/2017  . Stenosis of carotid artery 09/08/2017  . Cervical stenosis of spine 09/08/2017  . B12 deficiency 06/28/2017  . Headache disorder 02/15/2017  . Memory loss or impairment 02/15/2017  . Coronary artery disease involving native coronary artery of native heart   . Radiculitis of left cervical region 03/05/2016  . Diabetic neuropathy associated with type 2 diabetes mellitus ([place])  12/05/2015  . Patellar subluxation 06/05/2015  . Allergic rhinitis, seasonal 06/05/2015  . Chronic constipation 06/05/2015  . Chronic lower back pain 06/05/2015  . Morbid obesity due to excess calories ([place]) 06/05/2015  . Vitamin D deficiency 06/05/2015  . Central sleep apnea 06/05/2015  . 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 Nerve root pain 06/05/2015  . Acquired polycythemia 06/05/2015  . Anterior knee pain 06/05/2015  . Dysmetabolic syndrome 64/15/8309  . Failure of erection 06/05/2015  . Gastro-esophageal reflux disease without esophagitis 06/05/2015  . Neuropathy 06/05/2015  . CAD (coronary artery disease)   . Angina, class III ([place]) 05/23/2015  . Essential hypertension 05/23/2015  . Hyperlipidemia 05/23/2015  . Inguinal hernia without mention of obstruction or gangrene, recurrent unilateral or unspecified 03/24/2013    Past Surgical History:  Procedure Laterality Date  . BACK SURGERY  12/2008   X2-LUMBAR  . CARDIAC CATHETERIZATION N/A 05/30/2015   Procedure: Left Heart Cath;  Surgeon: Wellington Hampshire, MD;  Location: [place] [facility];  Service: Cardiovascular;  Laterality: N/A;  . CARDIAC CATHETERIZATION N/A 04/16/2016   Procedure: Left Heart Cath and Coronary Angiography;  Surgeon: Wellington Hampshire, MD;  Location: [facility] [facility];  Service: Cardiovascular;  Laterality: N/A;  . COLONOSCOPY  2014   Dr. [name]  . COLONOSCOPY WITH PROPOFOL N/A 12/25/2016   Procedure: COLONOSCOPY WITH PROPOFOL;  Surgeon: Jonathon Bellows, MD;  Location: [facility];  Service: Endoscopy;  Laterality: N/A;  . COLONOSCOPY WITH PROPOFOL N/A 01/29/2017   Procedure: COLONOSCOPY WITH PROPOFOL;  Surgeon: Jonathon Bellows, MD;  Location: [facility] ENDOSCOPY;  Service: Endoscopy;  Laterality: N/A;  . ESOPHAGOGASTRODUODENOSCOPY (EGD) WITH PROPOFOL N/A 01/26/2019   Procedure: ESOPHAGOGASTRODUODENOSCOPY (EGD) WITH PROPOFOL;  Surgeon: Lin Landsman, MD;  Location: [facility] ENDOSCOPY;  Service: Gastroenterology;  Laterality:  N/A;  . EVALUATION UNDER ANESTHESIA WITH HEMORRHOIDECTOMY N/A 02/24/2017   Procedure: EXAM UNDER ANESTHESIA WITH POSSIBLE EXCISION OF INTERNAL HEMORRHOIDS;  Surgeon: Olean Ree, MD;  Location: [facility] ORS;  Service: General;  Laterality: N/A;  . FISSURECTOMY  02/24/2017   Procedure: FISSURECTOMY;  Surgeon: Olean Ree, MD;  Location: [facility] ORS;  Service: General;;  . HERNIA REPAIR Right 1991  . INGUINAL HERNIA REPAIR Right 2012   Dr [name]  . INGUINAL HERNIA REPAIR Right 2014   Dr [name]    Family History  Problem Relation Age of Onset  . Heart Problems Mother   . Heart Problems Father 26       myocardial infarction  . Mental illness Brother     Social History   Socioeconomic History  . Marital status: Single    Spouse name: Not on file  . Number of children: Not on file  . Years of education: Not on file  . Highest education level: Not on file  Occupational History  . Not on file  Social Needs  . Financial resource strain: Not on file  . Food insecurity:    Worry: Not on file    Inability: Not on file  . Transportation needs:    Medical: Not on file    Non-medical: Not on file  Tobacco Use  . Smoking status: Former Smoker    Years: 2.00    Types: Cigars    Start date: 12/28/2002    Last attempt to quit: 06/04/2005    Years since quitting: 13.6  . Smokeless tobacco: Never Used  Substance and Sexual Activity  . Alcohol use: Yes    Alcohol/week: 1.0 standard drinks    Types: 1 Glasses of wine per week    Comment: RARE [employer]ER  . Drug use: No  . Sexual activity: Yes    Partners: Female  Lifestyle  . Physical activity:    Days per week: Not on file    Minutes per session: Not on file  . Stress: Not on file  Relationships  . Social connections:    Talks on phone: Not on file    Gets together: Not on file    Attends religious service: Not on file    Active member of club or organization: Not on file    Attends meetings of clubs or organizations: Not on file     Relationship status: Not on file  . Intimate partner violence:    Fear of current or ex partner: Not on file    Emotionally abused: Not on file    Physically abused: Not on file    Forced sexual activity: Not on file  Other Topics Concern  . Not on file  Social History Narrative  . Not on file     Current Outpatient Medications:  .  aspirin 81 MG tablet, Take 81 mg by mouth daily., Disp: , Rfl:  .  atorvastatin (LIPITOR) 80 MG tablet, TAKE 1 TABLET BY MOUTH EVERY DAY, Disp: 90 tablet, Rfl: 0 .  blood glucose meter kit and supplies KIT, Dispense based on patient and insurance preference. Use up to four  times daily as directed. (FOR ICD-9 250.00, 250.01)., Disp: 1 each, Rfl: 0 .  carvedilol (COREG) 12.5 MG tablet, TAKE 1 TABLET BY MOUTH TWICE DAILY, Disp: 60 tablet, Rfl: 2 .  cetirizine (ZYRTEC) 10 MG tablet, Take 10 mg by mouth daily as needed for allergies. , Disp: , Rfl: 1 .  cyanocobalamin (,VITAMIN B-12,) 1000 MCG/ML injection, Inject 1,000 mcg into the muscle every 30 (thirty) days., Disp: , Rfl:  .  diclofenac sodium (VOLTAREN) 1 % GEL, Apply 4 g topically 2 (two) times daily as needed., Disp: 100 g, Rfl: 5 .  docusate sodium (COLACE) 100 MG capsule, Take 1 capsule (100 mg total) by mouth 2 (two) times daily as needed for mild constipation., Disp: 10 capsule, Rfl: 0 .  fluticasone (FLONASE) 50 MCG/ACT nasal spray, Place 2 sprays into the nose daily as needed for allergies or rhinitis. , Disp: , Rfl:  .  Insulin Glargine (LANTUS) 100 UNIT/ML Solostar Pen, Inject 30 Units into the skin daily., Disp: 5 pen, Rfl: 1 .  Insulin Pen Needle 32G X 4 MM MISC, 30 Units by Does not apply route daily., Disp: 90 each, Rfl: 1 .  linagliptin (TRADJENTA) 5 MG TABS tablet, Take 1 tablet (5 mg total) by mouth daily., Disp: 30 tablet, Rfl: 0 .  metFORMIN (GLUCOPHAGE) 500 MG tablet, Take 1 tablet (500 mg total) by mouth 2 (two) times daily with a meal., Disp: 60 tablet, Rfl: 0 .  NITROSTAT 0.4 MG SL  tablet, place 1 tablet under the tongue every 5 minutes for UP TO 3 doses if needed for angina as directed by prescriber, Disp: 25 tablet, Rfl: 1 .  nortriptyline (PAMELOR) 10 MG capsule, take 1 capsule at bedtime daily, Disp: , Rfl: 1 .  omeprazole (PRILOSEC) 20 MG capsule, Take 1 capsule (20 mg total) by mouth every morning., Disp: 30 capsule, Rfl: 5 .  ranolazine (RANEXA) 1000 MG SR tablet, TAKE 1 TABLET BY MOUTH TWICE DAILY, Disp: 60 tablet, Rfl: 2 .  selenium sulfide (SELSUN) 2.5 % shampoo, Apply 1 application topically daily as needed for irritation., Disp: 118 mL, Rfl: 12 .  sildenafil (REVATIO) 20 MG tablet, 1 TO 2 TABLETS BY MOUTH EVERY DAY, Disp: , Rfl: 0 .  traMADol (ULTRAM) 50 MG tablet, Take 1 tablet (50 mg total) by mouth every 12 (twelve) hours as needed for moderate pain., Disp: 10 tablet, Rfl: 0 .  dicyclomine (BENTYL) 10 MG capsule, TAKE 1 CAPSULE BY MOUTH THREE TIMES A DAY BEFORE MEALS (Patient not taking: Reported on 02/02/2019), Disp: 90 capsule, Rfl: 0 .  FLECTOR 1.3 % PTCH, Place 1 patch onto the skin daily as needed (pain). (Patient not taking: Reported on 02/02/2019), Disp: 30 patch, Rfl: 5 .  venlafaxine (EFFEXOR) 75 MG tablet, Take 1 tablet by mouth daily., Disp: , Rfl: 3  Allergies  Allergen Reactions  . Gabapentin Other (See Comments)    Groggy-Mood Changes  . Latex Rash  . Penicillins Hives and Rash    Has patient had a PCN reaction causing immediate rash, facial/tongue/throat swelling, SOB or lightheadedness with hypotension: Yes Has patient had a PCN reaction causing severe rash involving mucus membranes or skin necrosis: No Has patient had a PCN reaction that required hospitalization No Has patient had a PCN reaction occurring within the last 10 years: No If all of the above answers are "NO", then may proceed with Cephalosporin use.     I personally reviewed active problem list, medication list, allergies, family history, social  history with the patient/caregiver  today.   ROS  Constitutional: Negative for fever or weight change.  Respiratory: Negative for cough and shortness of breath.   Cardiovascular: Negative for chest pain or palpitations.  Gastrointestinal: Negative for abdominal pain, no bowel changes.  Musculoskeletal: Negative for gait problem or joint swelling.  Skin: Negative for rash.  Neurological: Negative for dizziness or headache.  No other specific complaints in a complete review of systems (except as listed in HPI above).  Objective  Vitals:   02/02/19 1146  BP: 108/72  Pulse: 84  Resp: 16  Temp: 97.6 F (36.4 C)  TempSrc: Oral  SpO2: 97%  Weight: 254 lb (115.2 kg)  Height: _0  (1.803 m)    Body mass index is 35.43 kg/m.  Physical Exam  Constitutional: Patient appears well-developed and well-nourished. Obese  No distress.  HEENT: head atraumatic, normocephalic, pupils equal and reactive to light, neck supple, throat within normal limits Cardiovascular: Normal rate, regular rhythm and normal heart sounds.  No murmur heard. No BLE edema. Pulmonary/Chest: Effort normal and breath sounds normal. No respiratory distress. Abdominal: Soft.  There is no tenderness. Psychiatric: Patient has a normal mood and affect. behavior is normal. Judgment and thought content normal.  Recent Results (from the past 2160 hour(s))  Hemoglobin A1c     Status: Abnormal   Collection Time: 11/22/18  2:01 PM  Result Value Ref Range   Hgb A1c MFr Bld 6.7 (H) <5.7 % of total Hgb    Comment: For someone without known diabetes, a hemoglobin A1c value of 6.5% or greater indicates that they may have  diabetes and this should be confirmed with a follow-up  test. . For someone with known diabetes, a value <7% indicates  that their diabetes is well controlled and a value  greater than or equal to 7% indicates suboptimal  control. A1c targets should be individualized based on  duration of diabetes, age, comorbid conditions, and  other  considerations. . Currently, no consensus exists regarding use of hemoglobin A1c for diagnosis of diabetes for children. .    Mean Plasma Glucose 146 (calc)   eAG (mmol/L) 8.1 (calc)  COMPLETE METABOLIC PANEL WITH GFR     Status: Abnormal   Collection Time: 11/22/18  2:01 PM  Result Value Ref Range   Glucose, Bld 136 (H) 65 - 99 mg/dL    Comment: .            Fasting reference interval . For someone without known diabetes, a glucose value >125 mg/dL indicates that they may have diabetes and this should be confirmed with a follow-up test. .    BUN 15 7 - 25 mg/dL   Creat 1.06 0.70 - 1.33 mg/dL    Comment: For patients >6 years of age, the reference limit for Creatinine is approximately 13% higher for people identified as African-American. .    GFR, Est Non African American 78 > OR = 60 mL/min/1.61m   GFR, Est African American 90 > OR = 60 mL/min/1.738m  BUN/Creatinine Ratio NOT APPLICABLE 6 - 22 (calc)   Sodium 140 135 - 146 mmol/L   Potassium 4.3 3.5 - 5.3 mmol/L   Chloride 102 98 - 110 mmol/L   CO2 27 20 - 32 mmol/L   Calcium 10.3 8.6 - 10.3 mg/dL   Total Protein 7.5 6.1 - 8.1 g/dL   Albumin 4.8 3.6 - 5.1 g/dL   Globulin 2.7 1.9 - 3.7 g/dL (calc)   AG Ratio 1.8  1.0 - 2.5 (calc)   Total Bilirubin 0.4 0.2 - 1.2 mg/dL   Alkaline phosphatase (APISO) 80 40 - 115 U/L   AST 19 10 - 35 U/L   ALT 26 9 - 46 U/L  Lipid panel     Status: None   Collection Time: 11/22/18  2:01 PM  Result Value Ref Range   Cholesterol 157 <200 mg/dL   HDL 47 >40 mg/dL   Triglycerides 87 <150 mg/dL   LDL Cholesterol (Calc) 92 mg/dL (calc)    Comment: Reference range: <100 . Desirable range <100 mg/dL for primary prevention;   <70 mg/dL for patients with CHD or diabetic patients  with > or = 2 CHD risk factors. Marland Kitchen LDL-C is now calculated using the Martin-Hopkins  calculation, which is a validated novel method providing  better accuracy than the Friedewald equation in the  estimation of  LDL-C.  Cresenciano Genre et al. Annamaria Helling. 1093;235(57): 2061-2068  (http://education.[employer].com/faq/FAQ164)    Total CHOL/HDL Ratio 3.3 <5.0 (calc)   Non-HDL Cholesterol (Calc) 110 <130 mg/dL (calc)    Comment: For patients with diabetes plus 1 major ASCVD risk  factor, treating to a non-HDL-C goal of <100 mg/dL  (LDL-C of <70 mg/dL) is considered a therapeutic  option.   CBC with Differential/Platelet     Status: None   Collection Time: 12/23/18 11:43 AM  Result Value Ref Range   WBC 10.7 3.8 - 10.8 Thousand/uL   RBC 4.74 4.20 - 5.80 Million/uL   Hemoglobin 15.4 13.2 - 17.1 g/dL   HCT 44.8 38.5 - 50.0 %   MCV 94.5 80.0 - 100.0 fL   MCH 32.5 27.0 - 33.0 pg   MCHC 34.4 32.0 - 36.0 g/dL   RDW 12.7 11.0 - 15.0 %   Platelets 317 140 - 400 Thousand/uL   MPV 10.7 7.5 - 12.5 fL   Neutro Abs 5,692 1,500 - 7,800 cells/uL   Lymphs Abs 3,863 850 - 3,900 cells/uL   Absolute Monocytes 931 200 - 950 cells/uL   Eosinophils Absolute 150 15 - 500 cells/uL   Basophils Absolute 64 0 - 200 cells/uL   Neutrophils Relative % 53.2 %   Total Lymphocyte 36.1 %   Monocytes Relative 8.7 %   Eosinophils Relative 1.4 %   Basophils Relative 0.6 %  Glucose, capillary     Status: Abnormal   Collection Time: 01/24/19  1:44 PM  Result Value Ref Range   Glucose-Capillary >600 (HH) 70 - 99 mg/dL  Basic metabolic panel     Status: Abnormal   Collection Time: 01/24/19  1:58 PM  Result Value Ref Range   Sodium 122 (L) 135 - 145 mmol/L   Potassium 5.1 3.5 - 5.1 mmol/L   Chloride 90 (L) 98 - 111 mmol/L   CO2 18 (L) 22 - 32 mmol/L   Glucose, Bld 706 (HH) 70 - 99 mg/dL    Comment: CRITICAL RESULT CALLED TO, READ BACK BY AND VERIFIED WITH DAVA ISLEY AT 1425 ON 01/24/2019 [place].    BUN 29 (H) 6 - 20 mg/dL   Creatinine, Ser 2.59 (H) 0.61 - 1.24 mg/dL   Calcium 9.2 8.9 - 10.3 mg/dL   GFR calc non Af Amer 27 (L) >60 mL/min   GFR calc Af Amer 31 (L) >60 mL/min   Anion gap 14 5 - 15    Comment: Performed at  [facility], [address], [place], [place] [address]  CBC     Status: Abnormal   Collection Time:  01/24/19  2:59 PM  Result Value Ref Range   WBC 12.3 (H) 4.0 - 10.5 K/uL   RBC 4.95 4.22 - 5.81 MIL/uL   Hemoglobin 15.7 13.0 - 17.0 g/dL   HCT 44.4 39.0 - 52.0 %   MCV 89.7 80.0 - 100.0 fL   MCH 31.7 26.0 - 34.0 pg   MCHC 35.4 30.0 - 36.0 g/dL   RDW 11.9 11.5 - 15.5 %   Platelets 280 150 - 400 K/uL   nRBC 0.0 0.0 - 0.2 %    Comment: Performed at [facility], [address] [place], [place] [address]  Glucose, capillary     Status: Abnormal   Collection Time: 01/24/19  4:17 PM  Result Value Ref Range   Glucose-Capillary >600 (HH) 70 - 99 mg/dL  Blood gas, venous     Status: Abnormal   Collection Time: 01/24/19  4:56 PM  Result Value Ref Range   FIO2 0.21    pH, Ven 7.34 7.250 - 7.430   pCO2, Ven 43 (L) 44.0 - 60.0 mmHg   Bicarbonate 23.2 20.0 - 28.0 mmol/L   Acid-base deficit 2.6 (H) 0.0 - 2.0 mmol/L   O2 Saturation 40.7 %   Patient temperature 37.0    Collection site VENOUS    Sample type VENOUS     Comment: Performed at [facility], [address] [place], [place] [address]  Glucose, capillary     Status: Abnormal   Collection Time: 01/24/19  5:47 PM  Result Value Ref Range   Glucose-Capillary 425 (H) 70 - 99 mg/dL  MRSA PCR Screening     Status: None   Collection Time: 01/24/19  6:08 PM  Result Value Ref Range   MRSA by PCR NEGATIVE NEGATIVE    Comment:        The GeneXpert MRSA Assay (FDA approved for NASAL specimens only), is one component of a comprehensive MRSA colonization surveillance program. It is not intended to diagnose MRSA infection nor to guide or monitor treatment for MRSA infections. Performed at [facility], [address] [place],  [address]   Glucose, capillary     Status: Abnormal   Collection Time: 01/24/19  6:11 PM  Result Value Ref Range   Glucose-Capillary 381 (H) 70 - 99 mg/dL   Urinalysis, Complete w Microscopic     Status: Abnormal   Collection Time: 01/24/19  7:01 PM  Result Value Ref Range   Color, Urine STRAW (A) YELLOW   APPearance CLEAR (A) CLEAR   Specific Gravity, Urine 1.009 1.005 - 1.030   pH 6.0 5.0 - 8.0   Glucose, UA >=500 (A) NEGATIVE mg/dL   Hgb urine dipstick NEGATIVE NEGATIVE   Bilirubin Urine NEGATIVE NEGATIVE   Ketones, ur NEGATIVE NEGATIVE mg/dL   Protein, ur NEGATIVE NEGATIVE mg/dL   Nitrite NEGATIVE NEGATIVE   Leukocytes, UA NEGATIVE NEGATIVE   WBC, UA 0-5 0 - 5 WBC/hpf   Bacteria, UA NONE SEEN NONE SEEN   Squamous Epithelial / LPF NONE SEEN 0 - 5    Comment: Performed at [facility], [address] [place], [place] [address]  Glucose, capillary     Status: Abnormal   Collection Time: 01/24/19  7:26 PM  Result Value Ref Range   Glucose-Capillary 418 (H) 70 - 99 mg/dL   Comment 1 Notify RN   Basic metabolic panel     Status: Abnormal   Collection Time: 01/24/19  8:10 PM  Result Value Ref Range  Sodium 127 (L) 135 - 145 mmol/L   Potassium 4.4 3.5 - 5.1 mmol/L   Chloride 98 98 - 111 mmol/L   CO2 20 (L) 22 - 32 mmol/L   Glucose, Bld 440 (H) 70 - 99 mg/dL   BUN 28 (H) 6 - 20 mg/dL   Creatinine, Ser 2.18 (H) 0.61 - 1.24 mg/dL   Calcium 8.1 (L) 8.9 - 10.3 mg/dL   GFR calc non Af Amer 33 (L) >60 mL/min   GFR calc Af Amer 38 (L) >60 mL/min   Anion gap 9 5 - 15    Comment: Performed at [facility], [address] [place], [place] [address]  Beta-hydroxybutyric acid     Status: Abnormal   Collection Time: 01/24/19  8:10 PM  Result Value Ref Range   Beta-Hydroxybutyric Acid 0.34 (H) 0.05 - 0.27 mmol/L    Comment: Performed at [facility], [address] [place], [place] [address]  Glucose, capillary     Status: Abnormal   Collection Time: 01/24/19  9:19 PM  Result Value Ref Range   Glucose-Capillary 428 (H) 70 - 99 mg/dL   Comment 1 Notify RN   Glucose, capillary     Status: Abnormal    Collection Time: 01/24/19 10:30 PM  Result Value Ref Range   Glucose-Capillary 364 (H) 70 - 99 mg/dL   Comment 1 Notify RN   HIV antibody (Routine Testing)     Status: None   Collection Time: 01/24/19 11:20 PM  Result Value Ref Range   HIV Screen 4th Generation wRfx Non Reactive Non Reactive    Comment: (NOTE) Performed At: [facility] [place], [place] [id number] Rush Farmer MD OI:[id number]   Hemoglobin and hematocrit, blood     Status: Abnormal   Collection Time: 01/24/19 11:20 PM  Result Value Ref Range   Hemoglobin 12.9 (L) 13.0 - 17.0 g/dL   HCT 37.7 (L) 39.0 - 52.0 %    Comment: Performed at [facility], [address] [place], [place] [address]  Basic metabolic panel     Status: Abnormal   Collection Time: 01/24/19 11:20 PM  Result Value Ref Range   Sodium 132 (L) 135 - 145 mmol/L   Potassium 4.0 3.5 - 5.1 mmol/L   Chloride 100 98 - 111 mmol/L   CO2 25 22 - 32 mmol/L   Glucose, Bld 301 (H) 70 - 99 mg/dL   BUN 32 (H) 6 - 20 mg/dL   Creatinine, Ser 2.18 (H) 0.61 - 1.24 mg/dL   Calcium 8.1 (L) 8.9 - 10.3 mg/dL   GFR calc non Af Amer 33 (L) >60 mL/min   GFR calc Af Amer 38 (L) >60 mL/min   Anion gap 7 5 - 15    Comment: Performed at [facility], [address] [place], [place] [address]  Glucose, capillary     Status: Abnormal   Collection Time: 01/24/19 11:43 PM  Result Value Ref Range   Glucose-Capillary 250 (H) 70 - 99 mg/dL  Glucose, capillary     Status: Abnormal   Collection Time: 01/25/19 12:40 AM  Result Value Ref Range   Glucose-Capillary 190 (H) 70 - 99 mg/dL   Comment 1 Notify RN   Basic metabolic panel     Status: Abnormal   Collection Time: 01/25/19  1:34 AM  Result Value Ref Range   Sodium 132 (L) 135 - 145 mmol/L   Potassium 3.6 3.5 - 5.1 mmol/L   Chloride 103  98 - 111 mmol/L   CO2 24 22 - 32 mmol/L   Glucose, Bld 171 (H) 70 - 99 mg/dL   BUN 31 (H) 6 - 20 mg/dL   Creatinine, Ser 1.90 (H) 0.61 -  1.24 mg/dL   Calcium 8.0 (L) 8.9 - 10.3 mg/dL   GFR calc non Af Amer 39 (L) >60 mL/min   GFR calc Af Amer 45 (L) >60 mL/min   Anion gap 5 5 - 15    Comment: Performed at [facility], [address] [place], [place] [address]  Glucose, capillary     Status: Abnormal   Collection Time: 01/25/19  1:51 AM  Result Value Ref Range   Glucose-Capillary 148 (H) 70 - 99 mg/dL   Comment 1 Notify RN   Glucose, capillary     Status: Abnormal   Collection Time: 01/25/19  2:55 AM  Result Value Ref Range   Glucose-Capillary 151 (H) 70 - 99 mg/dL   Comment 1 Notify RN   Glucose, capillary     Status: Abnormal   Collection Time: 01/25/19  4:11 AM  Result Value Ref Range   Glucose-Capillary 149 (H) 70 - 99 mg/dL   Comment 1 Notify RN   Comprehensive metabolic panel     Status: Abnormal   Collection Time: 01/25/19  4:14 AM  Result Value Ref Range   Sodium 133 (L) 135 - 145 mmol/L   Potassium 3.5 3.5 - 5.1 mmol/L   Chloride 103 98 - 111 mmol/L   CO2 23 22 - 32 mmol/L   Glucose, Bld 158 (H) 70 - 99 mg/dL   BUN 29 (H) 6 - 20 mg/dL   Creatinine, Ser 1.58 (H) 0.61 - 1.24 mg/dL   Calcium 8.3 (L) 8.9 - 10.3 mg/dL   Total Protein 6.4 (L) 6.5 - 8.1 g/dL   Albumin 3.6 3.5 - 5.0 g/dL   AST 16 15 - 41 U/L   ALT 20 0 - 44 U/L   Alkaline Phosphatase 62 38 - 126 U/L   Total Bilirubin 0.6 0.3 - 1.2 mg/dL   GFR calc non Af Amer 48 (L) >60 mL/min   GFR calc Af Amer 56 (L) >60 mL/min   Anion gap 7 5 - 15    Comment: Performed at [facility], [address] [place], [place] [address]  CBC     Status: Abnormal   Collection Time: 01/25/19  4:14 AM  Result Value Ref Range   WBC 10.0 4.0 - 10.5 K/uL   RBC 4.24 4.22 - 5.81 MIL/uL   Hemoglobin 13.3 13.0 - 17.0 g/dL   HCT 38.6 (L) 39.0 - 52.0 %   MCV 91.0 80.0 - 100.0 fL   MCH 31.4 26.0 - 34.0 pg   MCHC 34.5 30.0 - 36.0 g/dL   RDW 12.0 11.5 - 15.5 %   Platelets 242 150 - 400 K/uL   nRBC 0.0 0.0 - 0.2 %    Comment: Performed at  [facility], [address] [place], [place] [address]  Hemoglobin A1c     Status: Abnormal   Collection Time: 01/25/19  4:14 AM  Result Value Ref Range   Hgb A1c MFr Bld 12.7 (H) 4.8 - 5.6 %    Comment: (NOTE) Pre diabetes:          5.7%-6.4% Diabetes:              >6.4% Glycemic control for   <7.0% adults with diabetes    Mean Plasma  Glucose 317.79 mg/dL    Comment: Performed at [facility], [place] [address], [place], [place] [address]  Glucose, capillary     Status: Abnormal   Collection Time: 01/25/19  5:46 AM  Result Value Ref Range   Glucose-Capillary 160 (H) 70 - 99 mg/dL  Glucose, capillary     Status: Abnormal   Collection Time: 01/25/19  6:49 AM  Result Value Ref Range   Glucose-Capillary 165 (H) 70 - 99 mg/dL   Comment 1 Notify RN   Hemoglobin and hematocrit, blood     Status: Abnormal   Collection Time: 01/25/19  7:41 AM  Result Value Ref Range   Hemoglobin 13.0 13.0 - 17.0 g/dL   HCT 37.4 (L) 39.0 - 52.0 %    Comment: Performed at [facility] [address] [place], [place] [address]  Basic metabolic panel     Status: Abnormal   Collection Time: 01/25/19  7:41 AM  Result Value Ref Range   Sodium 132 (L) 135 - 145 mmol/L   Potassium 3.5 3.5 - 5.1 mmol/L   Chloride 103 98 - 111 mmol/L   CO2 24 22 - 32 mmol/L   Glucose, Bld 176 (H) 70 - 99 mg/dL   BUN 26 (H) 6 - 20 mg/dL   Creatinine, Ser 1.52 (H) 0.61 - 1.24 mg/dL   Calcium 8.0 (L) 8.9 - 10.3 mg/dL   GFR calc non Af Amer 50 (L) >60 mL/min   GFR calc Af Amer 59 (L) >60 mL/min   Anion gap 5 5 - 15    Comment: Performed at [facility], [address] [place], [place] [address]  Glucose, capillary     Status: Abnormal   Collection Time: 01/25/19  7:47 AM  Result Value Ref Range   Glucose-Capillary 187 (H) 70 - 99 mg/dL  Glucose, capillary     Status: Abnormal   Collection Time: 01/25/19  9:02 AM  Result Value Ref Range   Glucose-Capillary 163 (H) 70 - 99 mg/dL   Glucose, capillary     Status: Abnormal   Collection Time: 01/25/19  9:57 AM  Result Value Ref Range   Glucose-Capillary 146 (H) 70 - 99 mg/dL  Glucose, capillary     Status: Abnormal   Collection Time: 01/25/19 11:55 AM  Result Value Ref Range   Glucose-Capillary 132 (H) 70 - 99 mg/dL  Glucose, capillary     Status: Abnormal   Collection Time: 01/25/19  4:22 PM  Result Value Ref Range   Glucose-Capillary 244 (H) 70 - 99 mg/dL  Glucose, capillary     Status: Abnormal   Collection Time: 01/25/19  7:59 PM  Result Value Ref Range   Glucose-Capillary 243 (H) 70 - 99 mg/dL  Glucose, capillary     Status: Abnormal   Collection Time: 01/26/19 12:31 AM  Result Value Ref Range   Glucose-Capillary 204 (H) 70 - 99 mg/dL  Glucose, capillary     Status: Abnormal   Collection Time: 01/26/19  4:08 AM  Result Value Ref Range   Glucose-Capillary 214 (H) 70 - 99 mg/dL  Glucose, capillary     Status: Abnormal   Collection Time: 01/26/19  7:42 AM  Result Value Ref Range   Glucose-Capillary 170 (H) 70 - 99 mg/dL   Comment 1 Notify RN   CBC     Status: None   Collection Time: 01/26/19  9:08 AM  Result Value Ref Range   WBC 8.5 4.0 - 10.5 K/uL  RBC 4.41 4.22 - 5.81 MIL/uL   Hemoglobin 13.9 13.0 - 17.0 g/dL   HCT 40.7 39.0 - 52.0 %   MCV 92.3 80.0 - 100.0 fL   MCH 31.5 26.0 - 34.0 pg   MCHC 34.2 30.0 - 36.0 g/dL   RDW 12.1 11.5 - 15.5 %   Platelets 223 150 - 400 K/uL   nRBC 0.0 0.0 - 0.2 %    Comment: Performed at [facility], [address] [place], [place] [address]  Basic metabolic panel     Status: Abnormal   Collection Time: 01/26/19  9:08 AM  Result Value Ref Range   Sodium 133 (L) 135 - 145 mmol/L   Potassium 3.9 3.5 - 5.1 mmol/L   Chloride 104 98 - 111 mmol/L   CO2 22 22 - 32 mmol/L   Glucose, Bld 200 (H) 70 - 99 mg/dL   BUN 18 6 - 20 mg/dL   Creatinine, Ser 1.07 0.61 - 1.24 mg/dL   Calcium 8.7 (L) 8.9 - 10.3 mg/dL   GFR calc non Af Amer >60 >60 mL/min   GFR calc  Af Amer >60 >60 mL/min   Anion gap 7 5 - 15    Comment: Performed at [facility], [address] [place], [place] [address]  Glucose, capillary     Status: Abnormal   Collection Time: 01/26/19  9:55 AM  Result Value Ref Range   Glucose-Capillary 182 (H) 70 - 99 mg/dL  Surgical pathology     Status: None   Collection Time: 01/26/19 11:55 AM  Result Value Ref Range   SURGICAL PATHOLOGY      Surgical Pathology CASE: [id number] PATIENT: Ander Gaster Surgical Pathology Report     SPECIMEN SUBMITTED: A. Stomach, r/o H. pylori; cbx  CLINICAL HISTORY: None provided  PRE-OPERATIVE DIAGNOSIS: Nausea vomiting  POST-OPERATIVE DIAGNOSIS: None provided.     DIAGNOSIS: A.  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 - UNREMARKABLE OXYNTIC MUCOSA. - NEGATIVE FOR HELICOBACTER; IHC STAIN EXAMINED. - NEGATIVE FOR INTESTINAL METAPLASIA, DYSPLASIA, AND MALIGNANCY.  Comment: A gastrin stain was performed to determine if one of the biopsy fragments represented antral mucosa versus oxyntic mucosa with early atrophic change. The gastrin stain confirms the tissue fragment is from the antrum. There is no evidence in this specimen of early atrophic change.  IHC slides were prepared by [facility] Path[facility], [place], [place]. All controls stained appropriately.  This test was develop ed and its performance characteristics determined by [facility]. It has not been cleared or approved by the [place] Food and Drug Administration. The FDA does not require this test to go through premarket FDA review. This test is used for clinical purposes. It should not be regarded as investigational or for research. This laboratory is certified under the Clinical Laboratory Improvement Amendments (CLIA) as qualified to perform high complexity clinical laboratory testing.  GROSS DESCRIPTION: A. Labeled: C BX gastric to rule  out H. pylori Received: Formalin Tissue fragment(s): 3 Size: 0.4-0.5 cm Description: Tan soft tissue fragments Entirely submitted in 1 cassette.   Final Diagnosis performed by Quay Burow, MD.   Electronically signed 01/31/2019 11:46:37AM The electronic signature indicates that the named Attending Pathologist has evaluated the specimen  Technical component performed at [facility], [address], [place], [place] [address] Lab: [phone] Dir: Tera Partridge a, MD, MMM  Professional component performed at [facility], [facility], [address], [place], [place] [address]  Lab: [phone] Dir: Dellia Nims. Rubinas, MD   Glucose, capillary     Status: Abnormal   Collection Time: 01/26/19  1:06 PM  Result Value Ref Range   Glucose-Capillary 193 (H) 70 - 99 mg/dL   Comment 1 Notify RN   Glucose, capillary     Status: Abnormal   Collection Time: 01/26/19  4:11 PM  Result Value Ref Range   Glucose-Capillary 240 (H) 70 - 99 mg/dL   Comment 1 Notify RN   Glucose, capillary     Status: Abnormal   Collection Time: 01/26/19  8:01 PM  Result Value Ref Range   Glucose-Capillary 208 (H) 70 - 99 mg/dL  Glucose, capillary     Status: Abnormal   Collection Time: 01/26/19 11:22 PM  Result Value Ref Range   Glucose-Capillary 193 (H) 70 - 99 mg/dL  Glucose, capillary     Status: Abnormal   Collection Time: 01/27/19  4:49 AM  Result Value Ref Range   Glucose-Capillary 215 (H) 70 - 99 mg/dL  Glucose, capillary     Status: Abnormal   Collection Time: 01/27/19  7:55 AM  Result Value Ref Range   Glucose-Capillary 198 (H) 70 - 99 mg/dL  Glucose, capillary     Status: Abnormal   Collection Time: 01/27/19 11:19 AM  Result Value Ref Range   Glucose-Capillary 201 (H) 70 - 99 mg/dL     PHQ2/9: Depression screen [facility] 2/9 12/23/2018 11/22/2018 06/09/2018 06/28/2017 03/10/2017  Decreased Interest 0 0 0 0 0  Down, Depressed, Hopeless 0 0 0 0 0  PHQ - 2 Score 0 0 0 0 0     Fall Risk: Fall  Risk  12/23/2018 11/22/2018 06/09/2018 03/15/2018 06/28/2017  Falls in the past year? 0 0 No Yes No  Number falls in past yr: 0 0 - 2 or more -  Injury with Fall? 1 - - Yes -  Comment - - - - -  Follow up - - - Falls evaluation completed -      Assessment & Plan  1. Controlled type 2 diabetes mellitus with microalbuminuria, without long-term current use of insulin (HCC)  - COMPLETE METABOLIC PANEL WITH GFR  He has an appointment with chronic care team on Monday and I will ask July Hendrick to adjust his insulin based on fasting glucose at home   2. Morbid obesity ([place])  He lost weight in Dec, likely from hyperglycemia  3. Essential hypertension  He never stopped taking losartan hctz and bp is at goal, we will continue it for now   4. Hospital discharge follow-up  - COMPLETE METABOLIC PANEL WITH GFR  5. Diabetic ketoacidosis without coma associated with type 2 diabetes mellitus (HCC)  - COMPLETE METABOLIC PANEL WITH GFR  6. Acute renal failure, unspecified acute renal failure type ([place])  - COMPLETE METABOLIC PANEL WITH GFR

## 2019-02-03 LAB — COMPLETE METABOLIC PANEL WITH GFR
AG Ratio: 1.5 (calc) (ref 1.0–2.5)
ALT: 37 U/L (ref 9–46)
AST: 25 U/L (ref 10–35)
Albumin: 4.3 g/dL (ref 3.6–5.1)
Alkaline phosphatase (APISO): 68 U/L (ref 35–144)
BUN/Creatinine Ratio: 13 (calc) (ref 6–22)
BUN: 18 mg/dL (ref 7–25)
CO2: 25 mmol/L (ref 20–32)
CREATININE: 1.34 mg/dL — AB (ref 0.70–1.33)
Calcium: 9.6 mg/dL (ref 8.6–10.3)
Chloride: 104 mmol/L (ref 98–110)
GFR, Est African American: 68 mL/min/{1.73_m2} (ref >=60)
GFR, Est Non African American: 59 mL/min/{1.73_m2} — ABNORMAL LOW (ref >=60)
GLUCOSE: 132 mg/dL — AB (ref 65–99)
Globulin: 2.8 g/dL (calc) (ref 1.9–3.7)
Potassium: 4.6 mmol/L (ref 3.5–5.3)
Sodium: 138 mmol/L (ref 135–146)
TOTAL PROTEIN: 7.1 g/dL (ref 6.1–8.1)
Total Bilirubin: 0.4 mg/dL (ref 0.2–1.2)

## 2019-02-17 DIAGNOSIS — H538 Other visual disturbances: Secondary | ICD-10-CM | POA: Diagnosis not present

## 2019-02-19 ENCOUNTER — Other Ambulatory Visit: Payer: Self-pay | Admitting: Family Medicine

## 2019-02-19 DIAGNOSIS — E782 Mixed hyperlipidemia: Secondary | ICD-10-CM

## 2019-02-20 ENCOUNTER — Encounter: Payer: Self-pay | Admitting: Family Medicine

## 2019-02-20 ENCOUNTER — Other Ambulatory Visit: Payer: Self-pay | Admitting: Family Medicine

## 2019-02-20 ENCOUNTER — Ambulatory Visit: Payer: Medicaid Other | Admitting: Gastroenterology

## 2019-02-20 DIAGNOSIS — R1013 Epigastric pain: Secondary | ICD-10-CM

## 2019-02-20 MED ORDER — GLUCOSE BLOOD VI STRP: ORAL_STRIP | 12 refills | Status: DC

## 2019-02-20 MED ORDER — ATORVASTATIN CALCIUM 80 MG PO TABS: 80.0000 mg | ORAL_TABLET | Freq: Every day | ORAL | 0 refills | Status: DC

## 2019-02-20 NOTE — Telephone Encounter (Signed)
Copied from [place] [id number]. Topic: Quick Communication - See Telephone Encounter >> Feb 20, 2019  1:17 PM [name] [name] wrote: CRM for notification. See Telephone encounter for: 02/20/19.  Patient is calling for [name] refill for Accu Chek Test Strip. Not seen in his chart.  Said he was given script at the hospital.  [facility] [place], [place] [phone] (Phone) [phone] (Fax)

## 2019-02-23 ENCOUNTER — Encounter: Payer: Self-pay | Admitting: Family Medicine

## 2019-02-24 ENCOUNTER — Other Ambulatory Visit: Payer: Self-pay | Admitting: Family Medicine

## 2019-02-24 ENCOUNTER — Encounter: Payer: Self-pay | Admitting: Family Medicine

## 2019-02-24 DIAGNOSIS — E782 Mixed hyperlipidemia: Secondary | ICD-10-CM

## 2019-02-25 ENCOUNTER — Other Ambulatory Visit: Payer: Self-pay | Admitting: Family Medicine

## 2019-02-25 MED ORDER — METFORMIN HCL 500 MG PO TABS: 500.0000 mg | ORAL_TABLET | Freq: Two times a day (BID) | ORAL | 0 refills | Status: DC

## 2019-02-25 MED ORDER — LINAGLIPTIN 5 MG PO TABS: 5.0000 mg | ORAL_TABLET | Freq: Every day | ORAL | 0 refills | Status: DC

## 2019-03-13 ENCOUNTER — Ambulatory Visit: Payer: Medicaid Other | Admitting: Gastroenterology

## 2019-03-13 ENCOUNTER — Other Ambulatory Visit: Payer: Self-pay | Admitting: Family Medicine

## 2019-03-14 ENCOUNTER — Other Ambulatory Visit: Payer: Self-pay

## 2019-03-14 ENCOUNTER — Other Ambulatory Visit: Payer: Self-pay | Admitting: Family Medicine

## 2019-03-14 ENCOUNTER — Ambulatory Visit: Payer: Medicaid Other | Admitting: Family Medicine

## 2019-03-14 ENCOUNTER — Telehealth: Payer: Self-pay

## 2019-03-14 MED ORDER — HYDROCHLOROTHIAZIDE 12.5 MG PO TABS: 12.5000 mg | ORAL_TABLET | Freq: Every day | ORAL | 0 refills | Status: DC

## 2019-03-14 MED ORDER — LOSARTAN POTASSIUM 100 MG PO TABS: 100.0000 mg | ORAL_TABLET | Freq: Every day | ORAL | 0 refills | Status: DC

## 2019-03-14 MED ORDER — OLMESARTAN MEDOXOMIL-HCTZ 20-12.5 MG PO TABS: 1.0000 | ORAL_TABLET | Freq: Every day | ORAL | 0 refills | Status: DC

## 2019-03-14 NOTE — Telephone Encounter (Signed)
Medication Losartan/HCTZ is on backorder needs to be split up. Patient confirmed he is taking 100-12.5 mg daily. Please send to the pharmacy since patient only has 3 left. Patient was notified.

## 2019-03-14 NOTE — Telephone Encounter (Signed)
[facility] fax [place] Losartan/HCTZ is on backorder. Please send new scripts.

## 2019-03-24 ENCOUNTER — Other Ambulatory Visit: Payer: Self-pay | Admitting: Family Medicine

## 2019-03-24 ENCOUNTER — Encounter: Payer: Self-pay | Admitting: Family Medicine

## 2019-03-30 ENCOUNTER — Ambulatory Visit: Payer: Medicaid Other | Admitting: Family Medicine

## 2019-04-01 ENCOUNTER — Other Ambulatory Visit: Payer: Self-pay | Admitting: Cardiovascular Disease

## 2019-04-10 DIAGNOSIS — M5030 Other cervical disc degeneration, unspecified cervical region: Secondary | ICD-10-CM | POA: Diagnosis not present

## 2019-04-12 ENCOUNTER — Telehealth: Payer: Self-pay | Admitting: Gastroenterology

## 2019-04-12 NOTE — Telephone Encounter (Signed)
Pt is calling stating he received a No show Fee $25 he states when he had an apt in [place] he cancelled that apt  He would like to receive a call regarding this

## 2019-04-18 ENCOUNTER — Encounter: Payer: Self-pay | Admitting: Family Medicine

## 2019-04-20 ENCOUNTER — Other Ambulatory Visit: Payer: Self-pay | Admitting: Family Medicine

## 2019-04-20 MED ORDER — INSULIN GLARGINE 100 UNIT/ML SOLOSTAR PEN: 30.0000 [IU] | PEN_INJECTOR | Freq: Every day | SUBCUTANEOUS | 1 refills | Status: DC

## 2019-04-20 NOTE — Telephone Encounter (Signed)
Copied from [place] [id number]. Topic: General - Other >> Apr 20, 2019 11:30 AM Lennox Solders wrote: Reason for CRM: pt is calling and needs a refill on lantus insulin . Pt is almost out. [address] in [place]

## 2019-04-20 NOTE — Telephone Encounter (Signed)
Refill request was sent to Dr. Krichna Sowles for approval and submission.  

## 2019-04-23 ENCOUNTER — Other Ambulatory Visit: Payer: Self-pay | Admitting: Family Medicine

## 2019-04-26 ENCOUNTER — Telehealth: Payer: Self-pay

## 2019-04-26 NOTE — Telephone Encounter (Signed)
Error

## 2019-04-26 NOTE — Telephone Encounter (Signed)
Virtual Visit Pre-Appointment Phone Call  "(Name), I am calling you today to discuss your upcoming appointment. We are currently trying to limit exposure to the virus that causes COVID-19 by seeing patients at home rather than in the office."  1. "What is the BEST phone number to call the [name] of the visit?" - include this in appointment notes  2. Do you have or have access to (through a family member/friend) a smartphone with video capability that we can use for your visit?" a. If yes - list this number in appt notes as cell (if different from BEST phone #) and list the appointment type as a VIDEO visit in appointment notes b. If no - list the appointment type as a PHONE visit in appointment notes  3. Confirm consent - "In the setting of the current Covid19 crisis, you are scheduled for a (phone or video) visit with your provider on (date) at (time).  Just as we do with many in-office visits, in order for you to participate in this visit, we must obtain consent.  If you'd like, I can send this to your mychart (if signed up) or email for you to review.  Otherwise, I can obtain your verbal consent now.  All virtual visits are billed to your insurance company just like a normal visit would be.  By agreeing to a virtual visit, we'd like you to understand that the technology does not allow for your provider to perform an examination, and thus may limit your provider's ability to fully assess your condition. If your provider identifies any concerns that need to be evaluated in person, we will make arrangements to do so.  Finally, though the technology is pretty good, we cannot assure that it will always work on either your or our end, and in the setting of a video visit, we may have to convert it to a phone-only visit.  In either situation, we cannot ensure that we have a secure connection.  Are you willing to proceed?" STAFF: Did the patient verbally acknowledge consent to telehealth visit? Document  YES/NO here: YES  4. Advise patient to be prepared - "Two hours prior to your appointment, go ahead and check your blood pressure, pulse, oxygen saturation, and your weight (if you have the equipment to check those) and write them all down. When your visit starts, your provider will ask you for this information. If you have an Apple Watch or Kardia device, please plan to have heart rate information ready on the [name] of your appointment. Please have a pen and paper handy nearby the [name] of the visit as well."  5. Give patient instructions for MyChart download to smartphone OR Doximity/[email or web address] as below if video visit (depending on what platform provider is using)  6. Inform patient they will receive a phone call 15 minutes prior to their appointment time (may be from unknown caller ID) so they should be prepared to answer    TELEPHONE CALL NOTE  [name] [name] has been deemed a candidate for a follow-up tele-health visit to limit community exposure during the Covid-19 pandemic. I spoke with the patient via phone to ensure availability of phone/video source, confirm preferred email & phone number, and discuss instructions and expectations.  I reminded [name] [name] to be prepared with any vital sign and/or heart rhythm information that could potentially be obtained via home monitoring, at the time of his visit. I reminded [name] [name] to expect a phone call prior to  his visit.  Clarisse Gouge 04/26/2019 2:54 PM   INSTRUCTIONS FOR DOWNLOADING THE MYCHART APP TO SMARTPHONE  - The patient must first make sure to have activated MyChart and know their login information - If Apple, go to [employer] and type in MyChart in the search bar and download the app. If Android, ask patient to go to [employer] and type in [place] in the search bar and download the app. The app is free but as with any other app downloads, their phone may require them to verify saved payment information or Apple/Android  password.  - The patient will need to then log into the app with their MyChart username and password, and select [facility] as their healthcare provider to link the account. When it is time for your visit, go to the MyChart app, find appointments, and click Begin Video Visit. Be sure to Select Allow for your device to access the Microphone and Camera for your visit. You will then be connected, and your provider will be with you shortly.  **If they have any issues connecting, or need assistance please contact MyChart service desk [phone])**  **If using a computer, in order to ensure the best quality for their visit they will need to use either of the following Internet Browsers: [employer], or Google Chrome**  IF USING DOXIMITY or DOXY.ME - The patient will receive a link just prior to their visit by text.     FULL LENGTH CONSENT FOR TELE-HEALTH VISIT   I hereby voluntarily request, consent and authorize [facility] and its employed or contracted physicians, physician assistants, nurse practitioners or other licensed health care professionals (the Practitioner), to provide me with telemedicine health care services (the Services") as deemed necessary by the treating Practitioner. I acknowledge and consent to receive the Services by the Practitioner via telemedicine. I understand that the telemedicine visit will involve communicating with the Practitioner through live audiovisual communication technology and the disclosure of certain medical information by electronic transmission. I acknowledge that I have been given the opportunity to request an in-person assessment or other available alternative prior to the telemedicine visit and am voluntarily participating in the telemedicine visit.  I understand that I have the right to withhold or withdraw my consent to the use of telemedicine in the course of my care at any time, without affecting my right to future care or treatment,  and that the Practitioner or I may terminate the telemedicine visit at any time. I understand that I have the right to inspect all information obtained and/or recorded in the course of the telemedicine visit and may receive copies of available information for a reasonable fee.  I understand that some of the potential risks of receiving the Services via telemedicine include:   Delay or interruption in medical evaluation due to technological equipment failure or disruption;  Information transmitted may not be sufficient (e.g. poor resolution of images) to allow for appropriate medical decision making by the Practitioner; and/or   In rare instances, security protocols could fail, causing a breach of personal health information.  Furthermore, I acknowledge that it is my responsibility to provide information about my medical history, conditions and care that is complete and accurate to the best of my ability. I acknowledge that Practitioner's advice, recommendations, and/or decision may be based on factors not within their control, such as incomplete or inaccurate data provided by me or distortions of diagnostic images or specimens that may result from electronic transmissions. I  understand that the practice of medicine is not an exact science and that Practitioner makes no warranties or guarantees regarding treatment outcomes. I acknowledge that I will receive a copy of this consent concurrently upon execution via email to the email address I last provided but may also request a printed copy by calling the office of [facility].    I understand that my insurance will be billed for this visit.   I have read or had this consent read to me.  I understand the contents of this consent, which adequately explains the benefits and risks of the Services being provided via telemedicine.   I have been provided ample opportunity to ask questions regarding this consent and the Services and have had my questions  answered to my satisfaction.  I give my informed consent for the services to be provided through the use of telemedicine in my medical care  By participating in this telemedicine visit I agree to the above.

## 2019-04-27 ENCOUNTER — Other Ambulatory Visit: Payer: Self-pay

## 2019-04-27 ENCOUNTER — Encounter: Payer: Self-pay | Admitting: Cardiovascular Disease

## 2019-04-27 ENCOUNTER — Telehealth (INDEPENDENT_AMBULATORY_CARE_PROVIDER_SITE_OTHER): Payer: Medicaid Other | Admitting: Cardiovascular Disease

## 2019-04-27 VITALS — BP 124/87 | Ht 71.0 in | Wt 252.0 lb

## 2019-04-27 DIAGNOSIS — I25118 Atherosclerotic heart disease of native coronary artery with other forms of angina pectoris: Secondary | ICD-10-CM

## 2019-04-27 DIAGNOSIS — E785 Hyperlipidemia, unspecified: Secondary | ICD-10-CM | POA: Diagnosis not present

## 2019-04-27 DIAGNOSIS — I10 Essential (primary) hypertension: Secondary | ICD-10-CM | POA: Diagnosis not present

## 2019-04-27 NOTE — Progress Notes (Signed)
Virtual Visit via Video Note   This visit type was conducted due to national recommendations for restrictions regarding the COVID-19 Pandemic (e.g. social distancing) in an effort to limit this patient's exposure and mitigate transmission in our community.  Due to his co-morbid illnesses, this patient is at least at moderate risk for complications without adequate follow up.  This format is felt to be most appropriate for this patient at this time.  All issues noted in this document were discussed and addressed.  A limited physical exam was performed with this format.  Please refer to the patient's chart for his consent to telehealth for [facility].   Evaluation Performed:  Follow-up visit  Date:  04/27/2019   ID:  [name] [name], [name] [name] [birth year], MRN [id number]  Patient Location: Home Provider Location: Office  PCP:  Steele Sizer, MD  Cardiologist:  [name], MD  Electrophysiologist:  None   Chief Complaint: 6 months follow-up  History of Present Illness:    [name] [name] is a 58 y.o. male who was seen via video visit for a follow-up visit regarding mild -moderate one-vessel coronary artery disease with stable angina and suspected endothelial dysfunction. He has known history of type 2 diabetes, hypertension, hyperlipidemia and obesity. He is a previous smoker. Cardiac catheterization in June 2016 showed moderate mid LAD stenosis (50%) with an FFR ratio of 0.83 . LV systolic function with normal with normal left ventricular end-diastolic pressure. He had worsening angina in April 2017. Repeat cardiac catheterization showed improvement in mid LAD stenosis to 30% and no evidence of obstructive coronary artery disease.  He does have sleep apnea on CPAP . The patient had recurrent episodes of loss of consciousness of unclear etiology.  Echocardiogram showed normal ejection fraction.  2-week outpatient monitor showed no significant arrhythmia.  It was felt that his episodes are  not cardiac in origin and he has been evaluated by neurology.  Some mild memory impairment is described.  The patient also had recurrent concussions growing up when he played sports.  He was hospitalized in January for severe hypoglycemia after colonoscopy.  His blood sugar was in the 700 range.  He was started on insulin with subsequent improvement.  He has been stable from a cardiac standpoint.  He reports only one episode of chest pain since last time I saw him.  His blood pressure has been controlled.  He is compliant with his medications    The patient does not have symptoms concerning for COVID-19 infection (fever, chills, cough, or new shortness of breath).    Past Medical History:  Diagnosis Date  . Allergy   . Angina, class III ([place])   . Chronic constipation   . Degeneration of intervertebral disc of cervical region   . Diabetes mellitus without complication ([place])   . Diastolic dysfunction    a. 06/2017 Echo: EF 60-65%, no rwma, Gr1 DD.  Marland Kitchen Dyspnea   . GERD (gastroesophageal reflux disease)   . Hernia 1991   02/10/2012-RIH repair  . Hyperlipidemia   . Hypertension 2008  . Nerve root pain   . Neuropathy   . Non-obstructive CAD (coronary artery disease)    a. 05/30/2015 cath: LM nl, mLAD 50% (FFR 0.83), LCx minor irregs, RCA minor irregs, EF 55-65%-->Med Rx; b. 03/2016 Cath: LM nl LAD 53m D1/2/3 min irregs, LCX min irregs, OM1/2/3 nl, RCA min irregs, RPDA/RPAV/RPL1/RPL2 nl-->Med Rx.  . Obesity, unspecified 2012  . Personal history of tobacco use, presenting hazards to health 2012  .  Polycythemia   . Rectal bleeding 02/11/2017  . Recurrent Right Inguinal Hernia Repair 02/09/2011, 04/07/2013   Dr [name], [facility]  . Sleep apnea    a. On CPAP;  b. 06/2017 Echo: no PAH.  Marland Kitchen Umbilical hernia without mention of obstruction or gangrene 02/09/2011   Dr [name], [facility]  . Vitamin D deficiency    Past Surgical History:  Procedure Laterality Date  . BACK SURGERY  12/2008   X2-LUMBAR  .  CARDIAC CATHETERIZATION N/A 05/30/2015   Procedure: Left Heart Cath;  Surgeon: Wellington Hampshire, MD;  Location: [place] [facility];  Service: Cardiovascular;  Laterality: N/A;  . CARDIAC CATHETERIZATION N/A 04/16/2016   Procedure: Left Heart Cath and Coronary Angiography;  Surgeon: Wellington Hampshire, MD;  Location: [place] [facility];  Service: Cardiovascular;  Laterality: N/A;  . COLONOSCOPY  2014   Dr. [name]  . COLONOSCOPY WITH PROPOFOL N/A 12/25/2016   Procedure: COLONOSCOPY WITH PROPOFOL;  Surgeon: Jonathon Bellows, MD;  Location: [facility] ENDOSCOPY;  Service: Endoscopy;  Laterality: N/A;  . COLONOSCOPY WITH PROPOFOL N/A 01/29/2017   Procedure: COLONOSCOPY WITH PROPOFOL;  Surgeon: Jonathon Bellows, MD;  Location: [facility] ENDOSCOPY;  Service: Endoscopy;  Laterality: N/A;  . ESOPHAGOGASTRODUODENOSCOPY (EGD) WITH PROPOFOL N/A 01/26/2019   Procedure: ESOPHAGOGASTRODUODENOSCOPY (EGD) WITH PROPOFOL;  Surgeon: Lin Landsman, MD;  Location: [facility] ENDOSCOPY;  Service: Gastroenterology;  Laterality: N/A;  . EVALUATION UNDER ANESTHESIA WITH HEMORRHOIDECTOMY N/A 02/24/2017   Procedure: EXAM UNDER ANESTHESIA WITH POSSIBLE EXCISION OF INTERNAL HEMORRHOIDS;  Surgeon: Olean Ree, MD;  Location: [facility] ORS;  Service: General;  Laterality: N/A;  . FISSURECTOMY  02/24/2017   Procedure: FISSURECTOMY;  Surgeon: Olean Ree, MD;  Location: [facility] ORS;  Service: General;;  . HERNIA REPAIR Right 1991  . INGUINAL HERNIA REPAIR Right 2012   Dr [name]  . INGUINAL HERNIA REPAIR Right 2014   Dr [name]     Current Meds  Medication Sig  . aspirin 81 MG tablet Take 81 mg by mouth daily.  Marland Kitchen atorvastatin (LIPITOR) 80 MG tablet Take 1 tablet (80 mg total) by mouth daily.  . blood glucose meter kit and supplies KIT Dispense based on patient and insurance preference. Use up to four times daily as directed. (FOR ICD-9 250.00, 250.01).  . carvedilol (COREG) 12.5 MG tablet TAKE 1 TABLET BY MOUTH TWICE DAILY  . cetirizine (ZYRTEC) 10 MG  tablet Take 10 mg by mouth daily as needed for allergies.   . cyanocobalamin (,VITAMIN B-12,) 1000 MCG/ML injection Inject 1,000 mcg into the muscle every 30 (thirty) days.  . diclofenac sodium (VOLTAREN) 1 % GEL Apply 4 g topically 2 (two) times daily as needed.  . dicyclomine (BENTYL) 10 MG capsule TAKE 1 CAPSULE BY MOUTH THREE TIMES A DAY BEFORE MEALS  . docusate sodium (COLACE) 100 MG capsule Take 1 capsule (100 mg total) by mouth 2 (two) times daily as needed for mild constipation.  Marland Kitchen ezetimibe (ZETIA) 10 MG tablet Take 10 mg by mouth daily.   . fluticasone (FLONASE) 50 MCG/ACT nasal spray Place 2 sprays into the nose daily as needed for allergies or rhinitis.   Marland Kitchen glucose blood test strip TID and prn  . hydrochlorothiazide (HYDRODIURIL) 12.5 MG tablet Take 1 tablet (12.5 mg total) by mouth daily.  Marland Kitchen HYDROcodone-acetaminophen (NORCO/VICODIN) 5-325 MG tablet 1/2 to 1 tab po q HS prn.  . Insulin Glargine (LANTUS) 100 UNIT/ML Solostar Pen Inject 30 Units into the skin daily.  . Insulin Pen Needle 32G X 4 MM  MISC 30 Units by Does not apply route daily.  Marland Kitchen losartan (COZAAR) 100 MG tablet Take 1 tablet (100 mg total) by mouth daily.  . metFORMIN (GLUCOPHAGE) 500 MG tablet TAKE 1 TABLET(500 MG) BY MOUTH TWICE DAILY WITH A MEAL  . NITROSTAT 0.4 MG SL tablet place 1 tablet under the tongue every 5 minutes for UP TO 3 doses if needed for angina as directed by prescriber  . nortriptyline (PAMELOR) 10 MG capsule take 1 capsule at bedtime daily  . RANEXA 1000 MG SR tablet TAKE 1 TABLET(1000 MG) BY MOUTH TWICE DAILY  . selenium sulfide (SELSUN) 2.5 % shampoo Apply 1 application topically daily as needed for irritation.  . sildenafil (REVATIO) 20 MG tablet 1 TO 2 TABLETS BY MOUTH EVERY DAY  . TRADJENTA 5 MG TABS tablet TAKE 1 TABLET(5 MG) BY MOUTH DAILY  . venlafaxine (EFFEXOR) 75 MG tablet Take 1 tablet by mouth daily.     Allergies:   Gabapentin; Latex; and Penicillins   Social History   Tobacco  Use  . Smoking status: Former Smoker    Years: 2.00    Types: Cigars    Start date: 12/28/2002    Last attempt to quit: 06/04/2005    Years since quitting: 13.9  . Smokeless tobacco: Never Used  Substance Use Topics  . Alcohol use: Yes    Alcohol/week: 1.0 standard drinks    Types: 1 Glasses of wine per week    Comment: [employer]  . Drug use: No     Family Hx: The patient's family history includes Heart Problems in his mother; Heart Problems (age of onset: 41) in his father; Mental illness in his brother.  ROS:   Please see the history of present illness.     All other systems reviewed and are negative.   Prior CV studies:   The following studies were reviewed today:    Labs/Other Tests and Data Reviewed:    EKG:  No ECG reviewed.  Recent Labs: 01/26/2019: Hemoglobin 13.9; Platelets 223 02/02/2019: ALT 37; BUN 18; Creat 1.34; Potassium 4.6; Sodium 138   Recent Lipid Panel Lab Results  Component Value Date/Time   CHOL 157 11/22/2018 02:01 PM   CHOL 167 05/08/2016 08:03 AM   TRIG 87 11/22/2018 02:01 PM   HDL 47 11/22/2018 02:01 PM   HDL 52 05/08/2016 08:03 AM   CHOLHDL 3.3 11/22/2018 02:01 PM   LDLCALC 92 11/22/2018 02:01 PM    Wt Readings from Last 3 Encounters:  04/27/19 252 lb (114.3 kg)  02/02/19 254 lb (115.2 kg)  02/01/19 258 lb (117 kg)     Objective:    Vital Signs:  BP 124/87   Ht '5\' 11"'  (1.803 m)   Wt 252 lb (114.3 kg)   BMI 35.15 kg/m    VITAL SIGNS:  reviewed GEN:  no acute distress EYES:  sclerae anicteric, EOMI - Extraocular Movements Intact RESPIRATORY:  normal respiratory effort, symmetric expansion CARDIOVASCULAR:  no peripheral edema SKIN:  no rash, lesions or ulcers. MUSCULOSKELETAL:  no obvious deformities. NEURO:  alert and oriented x 3, no obvious focal deficit PSYCH:  normal affect  ASSESSMENT & PLAN:    1.  Coronary artery disease involving native coronary arteries with other forms of angina:  The patient has suspected  endothelial dysfunction. Cardiac catheterization twice showed no evidence of obstructive coronary artery disease.    His symptoms are well controlled on antianginal medications including carvedilol and Ranexa  2. Hyperlipidemia:  Continue treatment with  high dose atorvastatin and Zetia.  3. Essential hypertension: Blood pressure is well controlled.    COVID-19 Education: The signs and symptoms of COVID-19 were discussed with the patient and how to seek care for testing (follow up with PCP or arrange E-visit).  The importance of social distancing was discussed today.  Time:   Today, I have spent 16 minutes with the patient with telehealth technology discussing the above problems.     Medication Adjustments/Labs and Tests Ordered: Current medicines are reviewed at length with the patient today.  Concerns regarding medicines are outlined above.   Tests Ordered: No orders of the defined types were placed in this encounter.   Medication Changes: No orders of the defined types were placed in this encounter.   Disposition:  Follow up in 6 month(s)  Signed, [name], MD  04/27/2019 2:18 PM    [facility]

## 2019-04-27 NOTE — Patient Instructions (Signed)
Medication Instructions:  Continue same medications If you need a refill on your cardiac medications before your next appointment, please call your pharmacy.   Lab work: None If you have labs (blood work) drawn today and your tests are completely normal, you will receive your results only by: . MyChart Message (if you have MyChart) OR . A paper copy in the mail If you have any lab test that is abnormal or we need to change your treatment, we will call you to review the results.  Testing/Procedures: None  Follow-Up: At [facility], you and your health needs are our priority.  As part of our continuing mission to provide you with exceptional heart care, we have created designated Provider Care Teams.  These Care Teams include your primary Cardiologist (physician) and Advanced Practice Providers (APPs -  Physician Assistants and Nurse Practitioners) who all work together to provide you with the care you need, when you need it. You will need a follow up appointment in 6 months.  Please call our office 2 months in advance to schedule this appointment.  You may see Alcee Sipos, MD or one of the following Advanced Practice Providers on your designated Care Team:   Christopher Berge, NP Ryan Dunn, PA-C . Jacquelyn Visser, PA-C   

## 2019-05-09 ENCOUNTER — Encounter: Payer: Self-pay | Admitting: Family Medicine

## 2019-05-10 ENCOUNTER — Encounter: Payer: Self-pay | Admitting: Family Medicine

## 2019-05-10 NOTE — Telephone Encounter (Signed)
You can write a letter statin, Mr. Viviano is high risk for COVID-19 compications

## 2019-05-10 NOTE — Telephone Encounter (Signed)
Copied from [place] [phone]. Topic: General - Other >> May 09, 2019 10:30 AM Rayann Heman wrote: Reason for CRM: pt called and stated that his fiance is going back to work at [employer] and does not believe that it is safe for him. Pt states that he would like a note that he can give to fiances employer.  Please advise   Patient was informed that letter could not be written for fiance because she is not a patient.  However patient wanted to know if he could have a letter stating that he is at high risk of complications due to his medical condition if he got COVID-19.

## 2019-05-31 ENCOUNTER — Other Ambulatory Visit: Payer: Self-pay | Admitting: Family Medicine

## 2019-05-31 ENCOUNTER — Other Ambulatory Visit: Payer: Self-pay | Admitting: Cardiovascular Disease

## 2019-05-31 DIAGNOSIS — I10 Essential (primary) hypertension: Secondary | ICD-10-CM

## 2019-06-01 NOTE — Telephone Encounter (Signed)
Pt scheduled virtual visit for 6.15.2020

## 2019-06-01 NOTE — Telephone Encounter (Signed)
Will this be a nurse visit or an actual appointment

## 2019-06-05 ENCOUNTER — Ambulatory Visit: Payer: Self-pay | Admitting: Family Medicine

## 2019-06-05 DIAGNOSIS — Z20822 Contact with and (suspected) exposure to covid-19: Secondary | ICD-10-CM

## 2019-06-05 NOTE — Telephone Encounter (Signed)
Over weekend I went to one of the rallies on Friday.    Sunday I have a fever and cough and don't feel good. I have high BP and diabetes so I know I'm high risk so I want to be tested for the COVID-19.  I scheduled him for 06/06/2019 at his request for 10:00 at the [facility] location in [place]. I let him know to stay in his car and wear a mask.   It was a drive thru.   He verbalized understanding.

## 2019-06-06 ENCOUNTER — Other Ambulatory Visit: Payer: Medicaid Other

## 2019-06-06 DIAGNOSIS — R6889 Other general symptoms and signs: Secondary | ICD-10-CM | POA: Diagnosis not present

## 2019-06-06 DIAGNOSIS — Z20822 Contact with and (suspected) exposure to covid-19: Secondary | ICD-10-CM

## 2019-06-09 ENCOUNTER — Encounter: Payer: Self-pay | Admitting: Family Medicine

## 2019-06-09 LAB — NOVEL CORONAVIRUS, NAA: SARS-CoV-2, NAA: NOT DETECTED

## 2019-06-10 ENCOUNTER — Encounter: Payer: Self-pay | Admitting: Family Medicine

## 2019-06-12 ENCOUNTER — Encounter: Payer: Self-pay | Admitting: Family Medicine

## 2019-06-12 ENCOUNTER — Ambulatory Visit (INDEPENDENT_AMBULATORY_CARE_PROVIDER_SITE_OTHER): Payer: Medicaid Other | Admitting: Family Medicine

## 2019-06-12 ENCOUNTER — Other Ambulatory Visit: Payer: Self-pay

## 2019-06-12 VITALS — BP 124/80 | HR 71 | Ht 71.0 in | Wt 252.0 lb

## 2019-06-12 DIAGNOSIS — E1129 Type 2 diabetes mellitus with other diabetic kidney complication: Secondary | ICD-10-CM

## 2019-06-12 DIAGNOSIS — N528 Other male erectile dysfunction: Secondary | ICD-10-CM

## 2019-06-12 DIAGNOSIS — E782 Mixed hyperlipidemia: Secondary | ICD-10-CM

## 2019-06-12 DIAGNOSIS — R809 Proteinuria, unspecified: Secondary | ICD-10-CM | POA: Diagnosis not present

## 2019-06-12 DIAGNOSIS — M79642 Pain in left hand: Secondary | ICD-10-CM

## 2019-06-12 DIAGNOSIS — R569 Unspecified convulsions: Secondary | ICD-10-CM | POA: Diagnosis not present

## 2019-06-12 DIAGNOSIS — E1140 Type 2 diabetes mellitus with diabetic neuropathy, unspecified: Secondary | ICD-10-CM

## 2019-06-12 LAB — POCT GLYCOSYLATED HEMOGLOBIN (HGB A1C): HbA1c, POC (controlled diabetic range): 6.2 % (ref 0.0–7.0)

## 2019-06-12 MED ORDER — EZETIMIBE 10 MG PO TABS: 10.0000 mg | ORAL_TABLET | Freq: Every day | ORAL | 1 refills | Status: DC

## 2019-06-12 MED ORDER — INSULIN GLARGINE 100 UNIT/ML SOLOSTAR PEN: 28.0000 [IU] | PEN_INJECTOR | Freq: Every day | SUBCUTANEOUS | 0 refills | Status: DC

## 2019-06-12 MED ORDER — LINAGLIPTIN 5 MG PO TABS: 5.0000 mg | ORAL_TABLET | Freq: Every day | ORAL | 1 refills | Status: DC

## 2019-06-12 MED ORDER — SILDENAFIL CITRATE 100 MG PO TABS: 50.0000 mg | ORAL_TABLET | Freq: Every day | ORAL | 0 refills | Status: DC | PRN

## 2019-06-12 MED ORDER — METFORMIN HCL 500 MG PO TABS: 500.0000 mg | ORAL_TABLET | Freq: Two times a day (BID) | ORAL | 0 refills | Status: DC

## 2019-06-12 MED ORDER — OLMESARTAN MEDOXOMIL 40 MG PO TABS: 40.0000 mg | ORAL_TABLET | Freq: Every day | ORAL | 0 refills | Status: DC

## 2019-06-12 MED ORDER — ATORVASTATIN CALCIUM 80 MG PO TABS: 80.0000 mg | ORAL_TABLET | Freq: Every day | ORAL | 1 refills | Status: DC

## 2019-06-12 NOTE — Progress Notes (Signed)
Name: [name] [name]   MRN: [id number]    DOB: 06/23/1961   Date:06/12/2019       Progress Note  Subjective  Chief Complaint  Chief Complaint  Patient presents with  . Medication Refill  . Diabetes    Checks three times a day running Average 115-125  . Hypertension    Constant headache in the back of his head  . Hyperlipidemia  . Sleep Apnea    Wearing CPAP nightly, Average sleep 3 hours  . Chronic Neck Pain  . Dizziness  . Finger Injury    Onset-2 weeks ago on his left thumb, fell and tried to catch him self     HPI  Left thumb: he was going down his attic ladder and shoe lace got stuck and fell backwards one week ago, he states left thumb ( proximal ) has been swollen, tender and still has a bruise, we will refer him to ortho, pain is constant and radiating to forearm.   DMII: last A1C was very high when he went to have an EGD and glucose was in the 700's, his A1C was over 12, he was admitted and was on insulin drip, he has been eating healthy since. Only drinking water, denies polyphagia, polydipsia and polyuria, trying to walk more and A1C is down to 6.2%. Continue medications, we will go down on Lantus to 28 units daily . He has ED, discussed good RX and he will get rx from [facility]   HTN/Angina : taking medication, denies chest pain or palpitation at this time  Hyperlipidemia: compliant with medication, we will recheck labs next visit when he comes in person  Seizure: no recent episodes, still avoids driving, he sees neurologist and is not on medication. He has also seen neurologist for headaches but stopped medication on his own   OSA: he is wearing CPAP at night, but only for 3 hours.      Patient Active Problem List   Diagnosis Date Noted  . Seizure ([place]) 02/22/2018  . Mild cognitive impairment 11/17/2017  . Radiculopathy of cervical region 11/17/2017  . Stenosis of carotid artery 09/08/2017  . Cervical stenosis of spine 09/08/2017  . B12 deficiency  06/28/2017  . Headache disorder 02/15/2017  . Memory loss or impairment 02/15/2017  . Coronary artery disease involving native coronary artery of native heart   . Radiculitis of left cervical region 03/05/2016  . Diabetic neuropathy associated with type 2 diabetes mellitus ([place]) 12/05/2015  . Patellar subluxation 06/05/2015  . Allergic rhinitis, seasonal 06/05/2015  . Chronic constipation 06/05/2015  . Chronic lower back pain 06/05/2015  . Morbid obesity due to excess calories ([place]) 06/05/2015  . Vitamin D deficiency 06/05/2015  . Central sleep apnea 06/05/2015  . 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 Nerve root pain 06/05/2015  . Acquired polycythemia 06/05/2015  . Anterior knee pain 06/05/2015  . Dysmetabolic syndrome 19/62/2297  . Failure of erection 06/05/2015  . Gastro-esophageal reflux disease without esophagitis 06/05/2015  . Neuropathy 06/05/2015  . CAD (coronary artery disease)   . Angina, class III ([place]) 05/23/2015  . Essential hypertension 05/23/2015  . Hyperlipidemia 05/23/2015  . Inguinal hernia without mention of obstruction or gangrene, recurrent unilateral or unspecified 03/24/2013    Past Surgical History:  Procedure Laterality Date  . BACK SURGERY  12/2008   X2-LUMBAR  . CARDIAC CATHETERIZATION N/A 05/30/2015   Procedure: Left Heart Cath;  Surgeon: Wellington Hampshire, MD;  Location: [facility];  Service: Cardiovascular;  Laterality:  N/A;  . CARDIAC CATHETERIZATION N/A 04/16/2016   Procedure: Left Heart Cath and Coronary Angiography;  Surgeon: Wellington Hampshire, MD;  Location: [facility];  Service: Cardiovascular;  Laterality: N/A;  . COLONOSCOPY  2014   Dr. [name]  . COLONOSCOPY WITH PROPOFOL N/A 12/25/2016   Procedure: COLONOSCOPY WITH PROPOFOL;  Surgeon: Jonathon Bellows, MD;  Location: [facility];  Service: Endoscopy;  Laterality: N/A;  . COLONOSCOPY WITH PROPOFOL N/A 01/29/2017   Procedure: COLONOSCOPY WITH PROPOFOL;  Surgeon: Jonathon Bellows, MD;  Location:  [facility];  Service: Endoscopy;  Laterality: N/A;  . ESOPHAGOGASTRODUODENOSCOPY (EGD) WITH PROPOFOL N/A 01/26/2019   Procedure: ESOPHAGOGASTRODUODENOSCOPY (EGD) WITH PROPOFOL;  Surgeon: Lin Landsman, MD;  Location: [facility];  Service: Gastroenterology;  Laterality: N/A;  . EVALUATION UNDER ANESTHESIA WITH HEMORRHOIDECTOMY N/A 02/24/2017   Procedure: EXAM UNDER ANESTHESIA WITH POSSIBLE EXCISION OF INTERNAL HEMORRHOIDS;  Surgeon: Olean Ree, MD;  Location: [facility];  Service: General;  Laterality: N/A;  . FISSURECTOMY  02/24/2017   Procedure: FISSURECTOMY;  Surgeon: Olean Ree, MD;  Location: [facility];  Service: General;;  . HERNIA REPAIR Right 1991  . INGUINAL HERNIA REPAIR Right 2012   Dr [name]  . INGUINAL HERNIA REPAIR Right 2014   Dr [name]    Family History  Problem Relation Age of Onset  . Heart Problems Mother   . Heart Problems Father 80       myocardial infarction  . Mental illness Brother     Social History   Socioeconomic History  . Marital status: Single    Spouse name: Not on file  . Number of children: Not on file  . Years of education: Not on file  . Highest education level: Not on file  Occupational History  . Not on file  Social Needs  . Financial resource strain: Not on file  . Food insecurity    Worry: Not on file    Inability: Not on file  . Transportation needs    Medical: Not on file    Non-medical: Not on file  Tobacco Use  . Smoking status: Former Smoker    Years: 2.00    Types: Cigars    Start date: 12/28/2002    Quit date: 06/04/2005    Years since quitting: 14.0  . Smokeless tobacco: Never Used  Substance and Sexual Activity  . Alcohol use: Yes    Alcohol/week: 1.0 standard drinks    Types: 1 Glasses of wine per week    Comment: [employer]  . Drug use: No  . Sexual activity: Yes    Partners: Female  Lifestyle  . Physical activity    Days per week: 0 days    Minutes per session: 0 min  . Stress: Not at all   Relationships  . Social connections    Talks on phone: More than three times a week    Gets together: More than three times a week    Attends religious service: More than 4 times per year    Active member of club or organization: Yes    Attends meetings of clubs or organizations: More than 4 times per year    Relationship status: Divorced  . Intimate partner violence    Fear of current or ex partner: No    Emotionally abused: No    Physically abused: No    Forced sexual activity: No  Other Topics Concern  . Not on file  Social History Narrative  . Not on file  Current Outpatient Medications:  .  aspirin 81 MG tablet, Take 81 mg by mouth daily., Disp: , Rfl:  .  atorvastatin (LIPITOR) 80 MG tablet, Take 1 tablet (80 mg total) by mouth daily., Disp: 90 tablet, Rfl: 1 .  blood glucose meter kit and supplies KIT, Dispense based on patient and insurance preference. Use up to four times daily as directed. (FOR ICD-9 250.00, 250.01)., Disp: 1 each, Rfl: 0 .  carvedilol (COREG) 12.5 MG tablet, TAKE 1 TABLET BY MOUTH TWICE DAILY, Disp: 60 tablet, Rfl: 4 .  cetirizine (ZYRTEC) 10 MG tablet, Take 10 mg by mouth daily as needed for allergies. , Disp: , Rfl: 1 .  cyanocobalamin (,VITAMIN B-12,) 1000 MCG/ML injection, Inject 1,000 mcg into the muscle every 30 (thirty) days., Disp: , Rfl:  .  diclofenac sodium (VOLTAREN) 1 % GEL, Apply 4 g topically 2 (two) times daily as needed., Disp: 100 g, Rfl: 5 .  dicyclomine (BENTYL) 10 MG capsule, TAKE 1 CAPSULE BY MOUTH THREE TIMES A DAY BEFORE MEALS, Disp: 90 capsule, Rfl: 0 .  ezetimibe (ZETIA) 10 MG tablet, Take 1 tablet (10 mg total) by mouth daily., Disp: 90 tablet, Rfl: 1 .  fluticasone (FLONASE) 50 MCG/ACT nasal spray, Place 2 sprays into the nose daily as needed for allergies or rhinitis. , Disp: , Rfl:  .  glucose blood test strip, TID and prn, Disp: 100 each, Rfl: 12 .  HYDROcodone-acetaminophen (NORCO/VICODIN) 5-325 MG tablet, 1/2 to 1 tab  po q HS prn., Disp: , Rfl:  .  Insulin Glargine (LANTUS) 100 UNIT/ML Solostar Pen, Inject 28 Units into the skin daily., Disp: 5 pen, Rfl: 0 .  Insulin Pen Needle 32G X 4 MM MISC, 30 Units by Does not apply route daily., Disp: 90 each, Rfl: 1 .  linagliptin (TRADJENTA) 5 MG TABS tablet, Take 1 tablet (5 mg total) by mouth daily., Disp: 90 tablet, Rfl: 1 .  metFORMIN (GLUCOPHAGE) 500 MG tablet, Take 1 tablet (500 mg total) by mouth 2 (two) times daily with a meal., Disp: 180 tablet, Rfl: 0 .  NITROSTAT 0.4 MG SL tablet, place 1 tablet under the tongue every 5 minutes for UP TO 3 doses if needed for angina as directed by prescriber, Disp: 25 tablet, Rfl: 1 .  olmesartan (BENICAR) 40 MG tablet, Take 1 tablet (40 mg total) by mouth daily. Stop losartan and olmasartan 20 mg, Disp: 90 tablet, Rfl: 0 .  RANEXA 1000 MG SR tablet, TAKE 1 TABLET(1000 MG) BY MOUTH TWICE DAILY, Disp: 180 tablet, Rfl: 0 .  selenium sulfide (SELSUN) 2.5 % shampoo, Apply 1 application topically daily as needed for irritation., Disp: 118 mL, Rfl: 12 .  sildenafil (VIAGRA) 100 MG tablet, Take 0.5-1 tablets (50-100 mg total) by mouth daily as needed for erectile dysfunction., Disp: 30 tablet, Rfl: 0  Allergies  Allergen Reactions  . Gabapentin Other (See Comments)    Groggy-Mood Changes  . Latex Rash  . Penicillins Hives and Rash    Has patient had a PCN reaction causing immediate rash, facial/tongue/throat swelling, SOB or lightheadedness with hypotension: Yes Has patient had a PCN reaction causing severe rash involving mucus membranes or skin necrosis: No Has patient had a PCN reaction that required hospitalization No Has patient had a PCN reaction occurring within the last 10 years: No If all of the above answers are "NO", then may proceed with Cephalosporin use.     I personally reviewed active problem list, medication list, allergies, family history,  social history with the patient/caregiver today.   ROS  Ten  systems reviewed and is negative except as mentioned in HPI   Objective  Vitals:   06/12/19 0843  BP: 124/80  Pulse: 71  Weight: 252 lb (114.3 kg)  Height: '5\' 11"'  (1.803 m)    Body mass index is 35.15 kg/m.  Physical Exam  Awake, alert and oriented Left thumb tender with movement and large area of swelling   Recent Results (from the past 2160 hour(s))  Novel Coronavirus, NAA ([facility])     Status: None   Collection Time: 06/06/19  9:38 AM  Result Value Ref Range   SARS-CoV-2, NAA Not Detected Not Detected    Comment: This test was developed and its performance characteristics determined by [employer]. This test has not been FDA cleared or approved. This test has been authorized by FDA under an Emergency Use Authorization (EUA). This test is only authorized for the duration of time the declaration that circumstances exist justifying the authorization of the emergency use of in vitro diagnostic tests for detection of SARS-CoV-2 virus and/or diagnosis of COVID-19 infection under section 564(b)(1) of the Act, 21 U.S.C. 831DVV-6(H)(6), unless the authorization is terminated or revoked sooner. When diagnostic testing is negative, the possibility of a false negative result should be considered in the context of a patient's recent exposures and the presence of clinical signs and symptoms consistent with COVID-19. An individual without symptoms of COVID-19 and who is not shedding SARS-CoV-2 virus would expect to have a negative (not detected) result in this assay.   POCT HgB A1C     Status: Normal   Collection Time: 06/12/19 10:00 AM  Result Value Ref Range   Hemoglobin A1C     HbA1c POC (<> result, manual entry)     HbA1c, POC (prediabetic range)     HbA1c, POC (controlled diabetic range) 6.2 0.0 - 7.0 %      PHQ2/9: Depression screen [facility] 2/9 06/12/2019 12/23/2018 11/22/2018 06/09/2018 06/28/2017  Decreased Interest 0 0 0 0 0  Down, Depressed, Hopeless 0 0 0 0 0   PHQ - 2 Score 0 0 0 0 0  Altered sleeping 0 - - - -  Tired, decreased energy 0 - - - -  Change in appetite 0 - - - -  Feeling bad or failure about yourself  0 - - - -  Trouble concentrating 0 - - - -  Moving slowly or fidgety/restless 0 - - - -  Suicidal thoughts 0 - - - -  PHQ-9 Score 0 - - - -  Difficult doing work/chores Not difficult at all - - - -    phq 9 is negative   Fall Risk: Fall Risk  06/12/2019 12/23/2018 11/22/2018 06/09/2018 03/15/2018  Falls in the past year? 1 0 0 No Yes  Number falls in past yr: 1 0 0 - 2 or more  Injury with Fall? 1 1 - - Yes  Comment Left Thumb - - - -  Follow up - - - - Falls evaluation completed     Functional Status Survey: Is the patient deaf or have difficulty hearing?: No Does the patient have difficulty seeing, even when wearing glasses/contacts?: No Does the patient have difficulty concentrating, remembering, or making decisions?: Yes Does the patient have difficulty walking or climbing stairs?: No Does the patient have difficulty dressing or bathing?: No Does the patient have difficulty doing errands alone such as visiting a doctor's office or shopping?: No  Assessment & Plan  1. Controlled type 2 diabetes mellitus with microalbuminuria, without long-term current use of insulin (HCC)  - POCT HgB A1C - olmesartan (BENICAR) 40 MG tablet; Take 1 tablet (40 mg total) by mouth daily. Stop losartan and olmasartan 20 mg  Dispense: 90 tablet; Refill: 0 - metFORMIN (GLUCOPHAGE) 500 MG tablet; Take 1 tablet (500 mg total) by mouth 2 (two) times daily with a meal.  Dispense: 180 tablet; Refill: 0 - Insulin Glargine (LANTUS) 100 UNIT/ML Solostar Pen; Inject 28 Units into the skin daily.  Dispense: 5 pen; Refill: 0  2. Seizure [facility])  Keep follow up with neurologist   3. Mixed hyperlipidemia  - atorvastatin (LIPITOR) 80 MG tablet; Take 1 tablet (80 mg total) by mouth daily.  Dispense: 90 tablet; Refill: 1 - ezetimibe (ZETIA) 10 MG  tablet; Take 1 tablet (10 mg total) by mouth daily.  Dispense: 90 tablet; Refill: 1  4. Morbid obesity due to excess calories [facility])  Discussed co-morbidities , he has been eating healthier  5. Controlled type 2 diabetes with neuropathy (HCC)  - olmesartan (BENICAR) 40 MG tablet; Take 1 tablet (40 mg total) by mouth daily. Stop losartan and olmasartan 20 mg  Dispense: 90 tablet; Refill: 0 - metFORMIN (GLUCOPHAGE) 500 MG tablet; Take 1 tablet (500 mg total) by mouth 2 (two) times daily with a meal.  Dispense: 180 tablet; Refill: 0 - Insulin Glargine (LANTUS) 100 UNIT/ML Solostar Pen; Inject 28 Units into the skin daily.  Dispense: 5 pen; Refill: 0  6. Other male erectile dysfunction  - sildenafil (VIAGRA) 100 MG tablet; Take 0.5-1 tablets (50-100 mg total) by mouth daily as needed for erectile dysfunction.  Dispense: 30 tablet; Refill: 0  7. Left hand pain  - Ambulatory referral to Orthopedic Surgery

## 2019-06-19 DIAGNOSIS — M25532 Pain in left wrist: Secondary | ICD-10-CM | POA: Diagnosis not present

## 2019-06-28 DIAGNOSIS — S93601A Unspecified sprain of right foot, initial encounter: Secondary | ICD-10-CM | POA: Diagnosis not present

## 2019-07-06 DIAGNOSIS — M189 Osteoarthritis of first carpometacarpal joint, unspecified: Secondary | ICD-10-CM | POA: Diagnosis not present

## 2019-07-06 DIAGNOSIS — M654 Radial styloid tenosynovitis [de Quervain]: Secondary | ICD-10-CM | POA: Diagnosis not present

## 2019-07-10 NOTE — Telephone Encounter (Signed)
Sent to accounting to take off No show charge

## 2019-07-11 ENCOUNTER — Other Ambulatory Visit: Payer: Self-pay | Admitting: Family Medicine

## 2019-07-11 DIAGNOSIS — M654 Radial styloid tenosynovitis [de Quervain]: Secondary | ICD-10-CM | POA: Diagnosis not present

## 2019-07-11 DIAGNOSIS — M189 Osteoarthritis of first carpometacarpal joint, unspecified: Secondary | ICD-10-CM | POA: Diagnosis not present

## 2019-07-13 ENCOUNTER — Encounter: Payer: Self-pay | Admitting: Family Medicine

## 2019-07-13 ENCOUNTER — Telehealth: Payer: Self-pay | Admitting: Family Medicine

## 2019-07-13 DIAGNOSIS — N528 Other male erectile dysfunction: Secondary | ICD-10-CM

## 2019-07-14 ENCOUNTER — Other Ambulatory Visit: Payer: Self-pay | Admitting: Family Medicine

## 2019-07-14 MED ORDER — INSULIN PEN NEEDLE 32G X 4 MM MISC: 30.0000 [IU] | Freq: Every day | 1 refills | Status: DC

## 2019-07-14 NOTE — Telephone Encounter (Signed)
Pt is calling and needs refill on 32G x4 MM pen needles. [address]

## 2019-07-16 MED ORDER — INSULIN PEN NEEDLE 32G X 4 MM MISC: 30.0000 [IU] | Freq: Every day | 1 refills | Status: DC

## 2019-07-17 NOTE — Telephone Encounter (Signed)
lvm for pt to call and schedule an appt °

## 2019-08-08 DIAGNOSIS — Z01812 Encounter for preprocedural laboratory examination: Secondary | ICD-10-CM | POA: Diagnosis not present

## 2019-08-08 DIAGNOSIS — Z20828 Contact with and (suspected) exposure to other viral communicable diseases: Secondary | ICD-10-CM | POA: Diagnosis not present

## 2019-08-10 ENCOUNTER — Encounter: Payer: Self-pay | Admitting: Family Medicine

## 2019-08-11 DIAGNOSIS — I10 Essential (primary) hypertension: Secondary | ICD-10-CM | POA: Diagnosis not present

## 2019-08-11 DIAGNOSIS — G4733 Obstructive sleep apnea (adult) (pediatric): Secondary | ICD-10-CM | POA: Diagnosis not present

## 2019-08-11 DIAGNOSIS — M1812 Unilateral primary osteoarthritis of first carpometacarpal joint, left hand: Secondary | ICD-10-CM | POA: Diagnosis not present

## 2019-08-11 DIAGNOSIS — Z87891 Personal history of nicotine dependence: Secondary | ICD-10-CM | POA: Diagnosis not present

## 2019-08-11 DIAGNOSIS — M25542 Pain in joints of left hand: Secondary | ICD-10-CM | POA: Diagnosis not present

## 2019-08-11 DIAGNOSIS — S52512K Displaced fracture of left radial styloid process, subsequent encounter for closed fracture with nonunion: Secondary | ICD-10-CM | POA: Diagnosis not present

## 2019-08-11 DIAGNOSIS — M25832 Other specified joint disorders, left wrist: Secondary | ICD-10-CM | POA: Diagnosis not present

## 2019-08-11 DIAGNOSIS — Z79899 Other long term (current) drug therapy: Secondary | ICD-10-CM | POA: Diagnosis not present

## 2019-08-11 DIAGNOSIS — E7800 Pure hypercholesterolemia, unspecified: Secondary | ICD-10-CM | POA: Diagnosis not present

## 2019-08-11 DIAGNOSIS — G8918 Other acute postprocedural pain: Secondary | ICD-10-CM | POA: Diagnosis not present

## 2019-08-11 DIAGNOSIS — Z888 Allergy status to other drugs, medicaments and biological substances status: Secondary | ICD-10-CM | POA: Diagnosis not present

## 2019-08-11 DIAGNOSIS — M654 Radial styloid tenosynovitis [de Quervain]: Secondary | ICD-10-CM | POA: Diagnosis not present

## 2019-08-11 DIAGNOSIS — S52511K Displaced fracture of right radial styloid process, subsequent encounter for closed fracture with nonunion: Secondary | ICD-10-CM | POA: Diagnosis not present

## 2019-08-11 DIAGNOSIS — Z794 Long term (current) use of insulin: Secondary | ICD-10-CM | POA: Diagnosis not present

## 2019-08-11 DIAGNOSIS — Z880 Allergy status to penicillin: Secondary | ICD-10-CM | POA: Diagnosis not present

## 2019-08-11 DIAGNOSIS — Z91040 Latex allergy status: Secondary | ICD-10-CM | POA: Diagnosis not present

## 2019-08-11 DIAGNOSIS — E119 Type 2 diabetes mellitus without complications: Secondary | ICD-10-CM | POA: Diagnosis not present

## 2019-09-07 DIAGNOSIS — M654 Radial styloid tenosynovitis [de Quervain]: Secondary | ICD-10-CM | POA: Diagnosis not present

## 2019-09-07 DIAGNOSIS — Z09 Encounter for follow-up examination after completed treatment for conditions other than malignant neoplasm: Secondary | ICD-10-CM | POA: Diagnosis not present

## 2019-09-07 DIAGNOSIS — M1812 Unilateral primary osteoarthritis of first carpometacarpal joint, left hand: Secondary | ICD-10-CM | POA: Diagnosis not present

## 2019-09-11 DIAGNOSIS — M1812 Unilateral primary osteoarthritis of first carpometacarpal joint, left hand: Secondary | ICD-10-CM | POA: Diagnosis not present

## 2019-09-11 DIAGNOSIS — M654 Radial styloid tenosynovitis [de Quervain]: Secondary | ICD-10-CM | POA: Diagnosis not present

## 2019-09-19 ENCOUNTER — Encounter: Payer: Self-pay | Admitting: Family Medicine

## 2019-09-21 ENCOUNTER — Other Ambulatory Visit: Payer: Self-pay

## 2019-09-21 ENCOUNTER — Ambulatory Visit (INDEPENDENT_AMBULATORY_CARE_PROVIDER_SITE_OTHER): Payer: Medicaid Other

## 2019-09-21 DIAGNOSIS — M654 Radial styloid tenosynovitis [de Quervain]: Secondary | ICD-10-CM | POA: Diagnosis not present

## 2019-09-21 DIAGNOSIS — Z23 Encounter for immunization: Secondary | ICD-10-CM | POA: Diagnosis not present

## 2019-09-21 DIAGNOSIS — M1812 Unilateral primary osteoarthritis of first carpometacarpal joint, left hand: Secondary | ICD-10-CM | POA: Diagnosis not present

## 2019-09-25 ENCOUNTER — Other Ambulatory Visit: Payer: Self-pay | Admitting: Family Medicine

## 2019-09-25 DIAGNOSIS — R809 Proteinuria, unspecified: Secondary | ICD-10-CM

## 2019-09-25 DIAGNOSIS — E1129 Type 2 diabetes mellitus with other diabetic kidney complication: Secondary | ICD-10-CM

## 2019-09-25 DIAGNOSIS — E1140 Type 2 diabetes mellitus with diabetic neuropathy, unspecified: Secondary | ICD-10-CM

## 2019-09-26 ENCOUNTER — Ambulatory Visit: Payer: Medicaid Other | Admitting: Family Medicine

## 2019-09-28 ENCOUNTER — Other Ambulatory Visit: Payer: Self-pay

## 2019-09-28 ENCOUNTER — Ambulatory Visit: Payer: Medicaid Other | Admitting: Family Medicine

## 2019-09-28 ENCOUNTER — Encounter: Payer: Self-pay | Admitting: Family Medicine

## 2019-09-28 VITALS — BP 160/100 | HR 65 | Temp 96.8°F | Resp 16 | Ht 71.0 in | Wt 280.0 lb

## 2019-09-28 DIAGNOSIS — E782 Mixed hyperlipidemia: Secondary | ICD-10-CM | POA: Diagnosis not present

## 2019-09-28 DIAGNOSIS — I2583 Coronary atherosclerosis due to lipid rich plaque: Secondary | ICD-10-CM | POA: Diagnosis not present

## 2019-09-28 DIAGNOSIS — M1812 Unilateral primary osteoarthritis of first carpometacarpal joint, left hand: Secondary | ICD-10-CM | POA: Diagnosis not present

## 2019-09-28 DIAGNOSIS — K5903 Drug induced constipation: Secondary | ICD-10-CM

## 2019-09-28 DIAGNOSIS — E1129 Type 2 diabetes mellitus with other diabetic kidney complication: Secondary | ICD-10-CM | POA: Diagnosis not present

## 2019-09-28 DIAGNOSIS — E1140 Type 2 diabetes mellitus with diabetic neuropathy, unspecified: Secondary | ICD-10-CM | POA: Diagnosis not present

## 2019-09-28 DIAGNOSIS — R569 Unspecified convulsions: Secondary | ICD-10-CM

## 2019-09-28 DIAGNOSIS — I10 Essential (primary) hypertension: Secondary | ICD-10-CM | POA: Diagnosis not present

## 2019-09-28 DIAGNOSIS — I2510 Atherosclerotic heart disease of native coronary artery without angina pectoris: Secondary | ICD-10-CM | POA: Diagnosis not present

## 2019-09-28 DIAGNOSIS — R809 Proteinuria, unspecified: Secondary | ICD-10-CM

## 2019-09-28 DIAGNOSIS — M19049 Primary osteoarthritis, unspecified hand: Secondary | ICD-10-CM | POA: Insufficient documentation

## 2019-09-28 DIAGNOSIS — N528 Other male erectile dysfunction: Secondary | ICD-10-CM | POA: Diagnosis not present

## 2019-09-28 DIAGNOSIS — M654 Radial styloid tenosynovitis [de Quervain]: Secondary | ICD-10-CM | POA: Diagnosis not present

## 2019-09-28 LAB — POCT GLYCOSYLATED HEMOGLOBIN (HGB A1C): HbA1c, POC (controlled diabetic range): 6.6 % (ref 0.0–7.0)

## 2019-09-28 MED ORDER — LUBIPROSTONE 24 MCG PO CAPS: 24.0000 ug | ORAL_CAPSULE | Freq: Two times a day (BID) | ORAL | 0 refills | Status: DC

## 2019-09-28 MED ORDER — SILDENAFIL CITRATE 100 MG PO TABS: 100.0000 mg | ORAL_TABLET | ORAL | 0 refills | Status: DC | PRN

## 2019-09-28 NOTE — Progress Notes (Signed)
Name: [name] [name]   MRN: [id number]    DOB: Jan 30, 1961   Date:09/28/2019       Progress Note  Subjective  Chief Complaint  Chief Complaint  Patient presents with  . Medication Refill    4 month F/U  . Diabetes    Checking twice daily running around 85-150   . Hypertension    Denies any symptoms  . Hyperlipidemia  . Seizures  . Sleep Apnea    HPI  Left thumb:s/p repair 07/2019, still wearing brace , he states finally off hydrocodone, still aching and is having OT and is doing better.   DMII: A1C was down to 6.2% but is up a little at 6.6%, still at goal and we will continue current regiment. He is on Lantus 25 units daily, metformin and Trajenta. Tolerating medications well.  Glucose at home has been 120's fasting, highest 150 occasionally. He has gained weight with steroids that he was given after thumb surgery. He is trying to eat healthier now. Denies polyphagia, polydipsia or polyuria. On viagra for ED, we will check urine micro today  Constipation: symptoms started after surgery , he was taking hydrocodone daily but stopped last week, still having hard stools, frequency down from daily to every two days, no blood in stools. He is taking miralax, stool softeners and milk of magnesium but is not working. We will try amitiza and if no improvement we will refer him earlier for colonoscopy, he is due for repeat in Feb   HTN/Angina : taking medication, bp is usually at goal, he did not take medications this am, denies chest pain or palpitation at this time. BP at home has been 120's/80's. He states came here form OT   Hyperlipidemia: compliant with medication, we will recheck labs today .  Seizure: no recent episodes, still avoids driving, he sees neurologist and is not on medication. He has also seen neurologist for headaches but stopped medication on his own . Unchanged, but has noticed mild dizzy spells.   OSA: he is wearing CPAP at night, but only for 3 hours. Unchanged    Morbid obesity: BMI above 35 wih multiple co-morbidities. He states he will try to increase physical activity and avoid carbohydrates   Patient Active Problem List   Diagnosis Date Noted  . Localized, primary osteoarthritis of hand 09/28/2019  . Seizure ([place]) 02/22/2018  . Mild cognitive impairment 11/17/2017  . Radiculopathy of cervical region 11/17/2017  . Stenosis of carotid artery 09/08/2017  . Cervical stenosis of spine 09/08/2017  . B12 deficiency 06/28/2017  . Headache disorder 02/15/2017  . Memory loss or impairment 02/15/2017  . Coronary artery disease involving native coronary artery of native heart   . Radiculitis of left cervical region 03/05/2016  . Diabetic neuropathy associated with type 2 diabetes mellitus ([place]) 12/05/2015  . Patellar subluxation 06/05/2015  . Allergic rhinitis, seasonal 06/05/2015  . Chronic constipation 06/05/2015  . Chronic lower back pain 06/05/2015  . Morbid obesity due to excess calories ([place]) 06/05/2015  . Vitamin D deficiency 06/05/2015  . Central sleep apnea 06/05/2015  . 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 Nerve root pain 06/05/2015  . Acquired polycythemia 06/05/2015  . Anterior knee pain 06/05/2015  . Dysmetabolic syndrome 50/35/4656  . Failure of erection 06/05/2015  . Gastro-esophageal reflux disease without esophagitis 06/05/2015  . Neuropathy 06/05/2015  . CAD (coronary artery disease)   . Angina, class III ([place]) 05/23/2015  . Essential hypertension 05/23/2015  . Hyperlipidemia 05/23/2015  . Inguinal hernia  without mention of obstruction or gangrene, recurrent unilateral or unspecified 03/24/2013    Past Surgical History:  Procedure Laterality Date  . BACK SURGERY  12/2008   X2-LUMBAR  . CARDIAC CATHETERIZATION N/A 05/30/2015   Procedure: Left Heart Cath;  Surgeon: Wellington Hampshire, MD;  Location: [facility];  Service: Cardiovascular;  Laterality: N/A;  . CARDIAC CATHETERIZATION N/A 04/16/2016   Procedure: Left Heart  Cath and Coronary Angiography;  Surgeon: Wellington Hampshire, MD;  Location: [facility];  Service: Cardiovascular;  Laterality: N/A;  . COLONOSCOPY  2014   Dr. [name]  . COLONOSCOPY WITH PROPOFOL N/A 12/25/2016   Procedure: COLONOSCOPY WITH PROPOFOL;  Surgeon: Jonathon Bellows, MD;  Location: [facility];  Service: Endoscopy;  Laterality: N/A;  . COLONOSCOPY WITH PROPOFOL N/A 01/29/2017   Procedure: COLONOSCOPY WITH PROPOFOL;  Surgeon: Jonathon Bellows, MD;  Location: [facility];  Service: Endoscopy;  Laterality: N/A;  . ESOPHAGOGASTRODUODENOSCOPY (EGD) WITH PROPOFOL N/A 01/26/2019   Procedure: ESOPHAGOGASTRODUODENOSCOPY (EGD) WITH PROPOFOL;  Surgeon: Lin Landsman, MD;  Location[facility];  Service: Gastroenterology;  Laterality: N/A;  . EVALUATION UNDER ANESTHESIA WITH HEMORRHOIDECTOMY N/A 02/24/2017   Procedure: EXAM UNDER ANESTHESIA WITH POSSIBLE EXCISION OF INTERNAL HEMORRHOIDS;  Surgeon: Olean Ree, MD;  Location: [facility];  Service: General;  Laterality: N/A;  . FISSURECTOMY  02/24/2017   Procedure: FISSURECTOMY;  Surgeon: Olean Ree, MD;  Location: [facility];  Service: General;;  . HERNIA REPAIR Right 1991  . INGUINAL HERNIA REPAIR Right 2012   Dr [name]  . INGUINAL HERNIA REPAIR Right 2014   Dr [name]    Family History  Problem Relation Age of Onset  . Heart Problems Mother   . Heart Problems Father 15       myocardial infarction  . Mental illness Brother     Social History   Socioeconomic History  . Marital status: Single    Spouse name: Not on file  . Number of children: Not on file  . Years of education: Not on file  . Highest education level: Not on file  Occupational History  . Not on file  Social Needs  . Financial resource strain: Not on file  . Food insecurity    Worry: Not on file    Inability: Not on file  . Transportation needs    Medical: Not on file    Non-medical: Not on file  Tobacco Use  . Smoking status: Former Smoker    Years:  2.00    Types: Cigars    Start date: 12/28/2002    Quit date: 06/04/2005    Years since quitting: 14.3  . Smokeless tobacco: Never Used  Substance and Sexual Activity  . Alcohol use: Yes    Alcohol/week: 1.0 standard drinks    Types: 1 Glasses of wine per week    Comment: R[employer]  . Drug use: No  . Sexual activity: Yes    Partners: Female  Lifestyle  . Physical activity    Days per week: 0 days    Minutes per session: 0 min  . Stress: Not at all  Relationships  . Social connections    Talks on phone: More than three times a week    Gets together: More than three times a week    Attends religious service: More than 4 times per year    Active member of club or organization: Yes    Attends meetings of clubs or organizations: More than 4 times per year  Relationship status: Divorced  . Intimate partner violence    Fear of current or ex partner: No    Emotionally abused: No    Physically abused: No    Forced sexual activity: No  Other Topics Concern  . Not on file  Social History Narrative  . Not on file     Current Outpatient Medications:  .  aspirin 81 MG tablet, Take 81 mg by mouth daily., Disp: , Rfl:  .  atorvastatin (LIPITOR) 80 MG tablet, Take 1 tablet (80 mg total) by mouth daily., Disp: 90 tablet, Rfl: 1 .  blood glucose meter kit and supplies KIT, Dispense based on patient and insurance preference. Use up to four times daily as directed. (FOR ICD-9 250.00, 250.01)., Disp: 1 each, Rfl: 0 .  carvedilol (COREG) 12.5 MG tablet, TAKE 1 TABLET BY MOUTH TWICE DAILY, Disp: 60 tablet, Rfl: 4 .  cetirizine (ZYRTEC) 10 MG tablet, Take 10 mg by mouth daily as needed for allergies. , Disp: , Rfl: 1 .  diclofenac sodium (VOLTAREN) 1 % GEL, Apply 4 g topically 2 (two) times daily as needed., Disp: 100 g, Rfl: 5 .  dicyclomine (BENTYL) 10 MG capsule, TAKE 1 CAPSULE BY MOUTH THREE TIMES A DAY BEFORE MEALS, Disp: 90 capsule, Rfl: 0 .  ezetimibe (ZETIA) 10 MG tablet, Take 1 tablet  (10 mg total) by mouth daily., Disp: 90 tablet, Rfl: 1 .  fluticasone (FLONASE) 50 MCG/ACT nasal spray, Place 2 sprays into the nose daily as needed for allergies or rhinitis. , Disp: , Rfl:  .  glucose blood test strip, TID and prn, Disp: 100 each, Rfl: 12 .  Insulin Glargine (LANTUS) 100 UNIT/ML Solostar Pen, Inject 28 Units into the skin daily., Disp: 5 pen, Rfl: 0 .  Insulin Pen Needle 32G X 4 MM MISC, 30 Units by Does not apply route daily., Disp: 90 each, Rfl: 1 .  linagliptin (TRADJENTA) 5 MG TABS tablet, Take 1 tablet (5 mg total) by mouth daily., Disp: 90 tablet, Rfl: 1 .  metFORMIN (GLUCOPHAGE) 500 MG tablet, Take 1 tablet (500 mg total) by mouth 2 (two) times daily with a meal., Disp: 180 tablet, Rfl: 0 .  NITROSTAT 0.4 MG SL tablet, place 1 tablet under the tongue every 5 minutes for UP TO 3 doses if needed for angina as directed by prescriber, Disp: 25 tablet, Rfl: 1 .  olmesartan (BENICAR) 40 MG tablet, TAKE 1 TABLET(40 MG) BY MOUTH DAILY. STOP LOSARTAN AND OLMASARTAN 20 MG, Disp: 90 tablet, Rfl: 0 .  RANEXA 1000 MG SR tablet, TAKE 1 TABLET(1000 MG) BY MOUTH TWICE DAILY, Disp: 180 tablet, Rfl: 0 .  selenium sulfide (SELSUN) 2.5 % shampoo, Apply 1 application topically daily as needed for irritation., Disp: 118 mL, Rfl: 12 .  sildenafil (VIAGRA) 100 MG tablet, Take 1 tablet (100 mg total) by mouth as needed for erectile dysfunction., Disp: 30 tablet, Rfl: 0 .  lubiprostone (AMITIZA) 24 MCG capsule, Take 1 capsule (24 mcg total) by mouth 2 (two) times daily with a meal., Disp: 60 capsule, Rfl: 0  Allergies  Allergen Reactions  . Gabapentin Other (See Comments)    Groggy-Mood Changes  . Latex Rash  . Penicillins Hives and Rash    Has patient had a PCN reaction causing immediate rash, facial/tongue/throat swelling, SOB or lightheadedness with hypotension: Yes Has patient had a PCN reaction causing severe rash involving mucus membranes or skin necrosis: No Has patient had a PCN  reaction that required hospitalization  No Has patient had a PCN reaction occurring within the last 10 years: No If all of the above answers are "NO", then may proceed with Cephalosporin use.     I personally reviewed active problem list, medication list, allergies, family history, social history, health maintenance with the patient/caregiver today.   ROS  Constitutional: Negative for fever, positive for  weight change.  Respiratory: Negative for cough and shortness of breath.   Cardiovascular: Negative for chest pain or palpitations.  Gastrointestinal: Negative for abdominal pain, no bowel changes.  Musculoskeletal: Negative for gait problem , positive for  joint swelling.  Skin: Negative for rash.  Neurological:Positive for intermittent dizziness but no headache.  No other specific complaints in a complete review of systems (except as listed in HPI above).  Objective  Vitals:   09/28/19 0903  BP: (!) 152/98  Pulse: 65  Resp: 16  Temp: (!) 96.8 F (36 C)  TempSrc: Temporal  SpO2: 99%  Weight: 280 lb (127 kg)  Height: '5\' 11"'  (1.803 m)    Body mass index is 39.05 kg/m.  Physical Exam  Constitutional: Patient appears well-developed and well-nourished. Obese No distress.  HEENT: head atraumatic, normocephalic, pupils equal and reactive to light Cardiovascular: Normal rate, regular rhythm and normal heart sounds.  No murmur heard. No BLE edema. Pulmonary/Chest: Effort normal and breath sounds normal. No respiratory distress. Abdominal: Soft.  There is no tenderness. Psychiatric: Patient has a normal mood and affect. behavior is normal. Judgment and thought content normal.  Recent Results (from the past 2160 hour(s))  POCT HgB A1C     Status: Normal   Collection Time: 09/28/19  9:13 AM  Result Value Ref Range   Hemoglobin A1C     HbA1c POC (<> result, manual entry)     HbA1c, POC (prediabetic range)     HbA1c, POC (controlled diabetic range) 6.6 0.0 - 7.0 %     Diabetic Foot Exam: Diabetic Foot Exam - Simple   Simple Foot Form Diabetic Foot exam was performed with the following findings: Yes 09/28/2019  9:49 AM  Visual Inspection See comments: Yes Sensation Testing Intact to touch and monofilament testing bilaterally: Yes Pulse Check Posterior Tibialis and Dorsalis pulse intact bilaterally: Yes Comments Thick toe nails       PHQ2/9: Depression screen [facility] 2/9 09/28/2019 06/12/2019 12/23/2018 11/22/2018 06/09/2018  Decreased Interest 0 0 0 0 0  Down, Depressed, Hopeless 0 0 0 0 0  PHQ - 2 Score 0 0 0 0 0  Altered sleeping 2 0 - - -  Tired, decreased energy 3 0 - - -  Change in appetite 1 0 - - -  Feeling bad or failure about yourself  0 0 - - -  Trouble concentrating 0 0 - - -  Moving slowly or fidgety/restless 0 0 - - -  Suicidal thoughts 0 0 - - -  PHQ-9 Score 6 0 - - -  Difficult doing work/chores Somewhat difficult Not difficult at all - - -    phq 9 is negative   Fall Risk: Fall Risk  09/28/2019 06/12/2019 12/23/2018 11/22/2018 06/09/2018  Falls in the past year? 0 1 0 0 No  Number falls in past yr: 0 1 0 0 -  Injury with Fall? 0 1 1 - -  Comment - Left Thumb - - -  Follow up - - - - -     Functional Status Survey: Is the patient deaf or have difficulty hearing?: No Does the patient  have difficulty seeing, even when wearing glasses/contacts?: No Does the patient have difficulty concentrating, remembering, or making decisions?: No Does the patient have difficulty walking or climbing stairs?: No Does the patient have difficulty dressing or bathing?: No Does the patient have difficulty doing errands alone such as visiting a doctor's office or shopping?: No    Assessment & Plan  1. Controlled type 2 diabetes mellitus with microalbuminuria, without long-term current use of insulin (HCC)  - POCT HgB A1C - Urine Microalbumin w/creat. ratio  2. Other male erectile dysfunction  - sildenafil (VIAGRA) 100 MG tablet;  Take 1 tablet (100 mg total) by mouth as needed for erectile dysfunction.  Dispense: 30 tablet; Refill: 0  3. Controlled type 2 diabetes with neuropathy ([place])   4. Essential hypertension  - COMPLETE METABOLIC PANEL WITH GFR - CBC with Differential/Platelet  5. Morbid obesity ([place])  Discussed with the patient the risk posed by an increased BMI. Discussed importance of portion control, calorie counting and at least 150 minutes of physical activity weekly. Avoid sweet beverages and drink more water. Eat at least 6 servings of fruit and vegetables daily   6. Coronary artery disease due to lipid rich plaque   7. Mixed hyperlipidemia  - Lipid panel  8. Seizure ([place])  Doing well   9. Drug-induced constipation  - lubiprostone (AMITIZA) 24 MCG capsule; Take 1 capsule (24 mcg total) by mouth 2 (two) times daily with a meal.  Dispense: 60 capsule; Refill: 0

## 2019-09-29 ENCOUNTER — Encounter: Payer: Self-pay | Admitting: Family Medicine

## 2019-09-29 ENCOUNTER — Other Ambulatory Visit: Payer: Self-pay | Admitting: Family Medicine

## 2019-09-29 LAB — COMPLETE METABOLIC PANEL WITH GFR
AG Ratio: 1.4 (calc) (ref 1.0–2.5)
ALT: 31 U/L (ref 9–46)
AST: 24 U/L (ref 10–35)
Albumin: 4.2 g/dL (ref 3.6–5.1)
Alkaline phosphatase (APISO): 77 U/L (ref 35–144)
BUN: 12 mg/dL (ref 7–25)
CO2: 26 mmol/L (ref 20–32)
Calcium: 9.5 mg/dL (ref 8.6–10.3)
Chloride: 102 mmol/L (ref 98–110)
Creat: 1.09 mg/dL (ref 0.70–1.33)
GFR, Est African American: 87 mL/min/{1.73_m2} (ref >=60)
GFR, Est Non African American: 75 mL/min/{1.73_m2} (ref >=60)
Globulin: 3 g/dL (calc) (ref 1.9–3.7)
Glucose, Bld: 136 mg/dL — ABNORMAL HIGH (ref 65–99)
Potassium: 4.2 mmol/L (ref 3.5–5.3)
Sodium: 137 mmol/L (ref 135–146)
Total Bilirubin: 0.5 mg/dL (ref 0.2–1.2)
Total Protein: 7.2 g/dL (ref 6.1–8.1)

## 2019-09-29 LAB — CBC WITH DIFFERENTIAL/PLATELET
Absolute Monocytes: 757 cells/uL (ref 200–950)
Basophils Absolute: 53 cells/uL (ref 0–200)
Basophils Relative: 0.6 %
Eosinophils Absolute: 178 cells/uL (ref 15–500)
Eosinophils Relative: 2 %
HCT: 45.9 % (ref 38.5–50.0)
Hemoglobin: 15.3 g/dL (ref 13.2–17.1)
Lymphs Abs: 3498 cells/uL (ref 850–3900)
MCH: 30.7 pg (ref 27.0–33.0)
MCHC: 33.3 g/dL (ref 32.0–36.0)
MCV: 92.2 fL (ref 80.0–100.0)
MPV: 10.4 fL (ref 7.5–12.5)
Monocytes Relative: 8.5 %
Neutro Abs: 4414 cells/uL (ref 1500–7800)
Neutrophils Relative %: 49.6 %
Platelets: 257 10*3/uL (ref 140–400)
RBC: 4.98 10*6/uL (ref 4.20–5.80)
RDW: 12.9 % (ref 11.0–15.0)
Total Lymphocyte: 39.3 %
WBC: 8.9 10*3/uL (ref 3.8–10.8)

## 2019-09-29 LAB — MICROALBUMIN / CREATININE URINE RATIO
Creatinine, Urine: 60 mg/dL (ref 20–320)
Microalb Creat Ratio: 208 mcg/mg creat — ABNORMAL HIGH (ref <30)
Microalb, Ur: 12.5 mg/dL

## 2019-09-29 LAB — LIPID PANEL
Cholesterol: 133 mg/dL (ref <200)
HDL: 42 mg/dL (ref >=40)
LDL Cholesterol (Calc): 68 mg/dL (calc)
Non-HDL Cholesterol (Calc): 91 mg/dL (calc) (ref <130)
Total CHOL/HDL Ratio: 3.2 (calc) (ref <5.0)
Triglycerides: 146 mg/dL (ref <150)

## 2019-09-29 MED ORDER — VALSARTAN 160 MG PO TABS: 160.0000 mg | ORAL_TABLET | Freq: Every day | ORAL | 0 refills | Status: DC

## 2019-09-29 NOTE — Telephone Encounter (Signed)
Patient states he needs a PA on Olmesartan on 40 mg. He only has one more pill left.

## 2019-09-29 NOTE — Telephone Encounter (Signed)
Copied from [place] [id number]. Topic: General - Other >> Sep 29, 2019  9:27 AM Rainey Pines A wrote: Patient would like a callback from [place] in regards to his blood pressure medication

## 2019-10-02 DIAGNOSIS — M4802 Spinal stenosis, cervical region: Secondary | ICD-10-CM | POA: Diagnosis not present

## 2019-10-02 DIAGNOSIS — M62838 Other muscle spasm: Secondary | ICD-10-CM | POA: Diagnosis not present

## 2019-10-02 DIAGNOSIS — M5412 Radiculopathy, cervical region: Secondary | ICD-10-CM | POA: Diagnosis not present

## 2019-10-02 DIAGNOSIS — M5030 Other cervical disc degeneration, unspecified cervical region: Secondary | ICD-10-CM | POA: Diagnosis not present

## 2019-10-05 DIAGNOSIS — M25532 Pain in left wrist: Secondary | ICD-10-CM | POA: Diagnosis not present

## 2019-10-09 DIAGNOSIS — M654 Radial styloid tenosynovitis [de Quervain]: Secondary | ICD-10-CM | POA: Diagnosis not present

## 2019-10-09 DIAGNOSIS — M1812 Unilateral primary osteoarthritis of first carpometacarpal joint, left hand: Secondary | ICD-10-CM | POA: Diagnosis not present

## 2019-10-19 ENCOUNTER — Ambulatory Visit (INDEPENDENT_AMBULATORY_CARE_PROVIDER_SITE_OTHER): Payer: Medicaid Other | Admitting: Cardiovascular Disease

## 2019-10-19 ENCOUNTER — Encounter: Payer: Self-pay | Admitting: Cardiovascular Disease

## 2019-10-19 ENCOUNTER — Other Ambulatory Visit: Payer: Self-pay

## 2019-10-19 ENCOUNTER — Telehealth: Payer: Self-pay

## 2019-10-19 VITALS — BP 145/90 | HR 58 | Ht 71.0 in | Wt 277.2 lb

## 2019-10-19 DIAGNOSIS — I25118 Atherosclerotic heart disease of native coronary artery with other forms of angina pectoris: Secondary | ICD-10-CM | POA: Diagnosis not present

## 2019-10-19 DIAGNOSIS — M1812 Unilateral primary osteoarthritis of first carpometacarpal joint, left hand: Secondary | ICD-10-CM | POA: Diagnosis not present

## 2019-10-19 DIAGNOSIS — I10 Essential (primary) hypertension: Secondary | ICD-10-CM | POA: Diagnosis not present

## 2019-10-19 DIAGNOSIS — E782 Mixed hyperlipidemia: Secondary | ICD-10-CM | POA: Diagnosis not present

## 2019-10-19 DIAGNOSIS — I6529 Occlusion and stenosis of unspecified carotid artery: Secondary | ICD-10-CM | POA: Diagnosis not present

## 2019-10-19 DIAGNOSIS — M654 Radial styloid tenosynovitis [de Quervain]: Secondary | ICD-10-CM | POA: Diagnosis not present

## 2019-10-19 MED ORDER — CARVEDILOL 12.5 MG PO TABS: 12.5000 mg | ORAL_TABLET | Freq: Two times a day (BID) | ORAL | 3 refills | Status: DC

## 2019-10-19 MED ORDER — LOSARTAN POTASSIUM-HCTZ 50-12.5 MG PO TABS: 1.0000 | ORAL_TABLET | Freq: Every day | ORAL | 1 refills | Status: DC

## 2019-10-19 MED ORDER — RANOLAZINE ER 1000 MG PO TB12: ORAL_TABLET | ORAL | 3 refills | Status: DC

## 2019-10-19 NOTE — Progress Notes (Signed)
Cardiology Office Note  Date:  10/19/2019   ID:  [name], [name] 01-31-1961, MRN [id number]  PCP:  [name] Sizer, MD   Chief Complaint  Patient presents with  . other    6 month follow up. Meds reviewed by the pt. verbally. Pt. c/o shortness of breath and chest pain that comes and goes with or without activity.     HPI:  [name] is a 58 year old gentleman with past medical history of Coronary artery disease, single-vessel  stable angina and suspected endothelial dysfunction. type 2 diabetes,  Hypertension, hyperlipidemia   obesity.  previous smoker. Obstructive sleep apnea on CPAP Episodes of loss of consciousness, unclear etiology Who presents for follow-up of his coronary artery disease  Reports doing well in general, though does report having episode of near syncope, was walking into attic [name] [name], broke left wrist Had wrist surgery  Cortisone shot 12/2018 for neck pain In the hospital for hypergylcemia after colonoscopy.   His blood sugar was in the 700 range.  started on insulin with subsequent improvement. insulin infusion while he was in the hospital As outpatient was put on prednisone oral dosing for wrist inflammation, stopped on his own  Lab work reviewed with him HBA1C 6.6  Has some SOB on todays visit Weight up 30 pounds in one year Thinks he is deconditioned, has not been working out and given the weight gain  Home blood pressure 131/84 Elevated today, mild improvement on recheck 106 systolic down to 269 Feels it is elevated because he walked " 3 miles" to get into the office  EKG personally reviewed by myself on todays visit Shows normal sinus rhythm rate 58 bpm no significant ST-T wave changes  Other past medical history reviewed Cardiac catheterization in June 2016 showed moderate mid LAD stenosis (50%) with an FFR ratio of 0.83 . LV systolic function with normal with normal left ventricular end-diastolic pressure.  angina in April 2017.   Repeat cardiac catheterization showed improvement in mid LAD stenosis to 30% and no evidence of obstructive coronary artery disease.  For prior syncopal episodes Echocardiogram showed normal ejection fraction.  2-week outpatient monitor showed no significant arrhythmia. Episodes felt noncardiac in etiology Evaluated by neurology  mild memory impairment   recurrent concussions growing up when he played sports.    PMH:   has a past medical history of Allergy, Angina, class III ([place]), Chronic constipation, Degeneration of intervertebral disc of cervical region, Diabetes mellitus without complication ([place]), Diastolic dysfunction, Dyspnea, GERD (gastroesophageal reflux disease), Hernia (1991), Hyperlipidemia, Hypertension (2008), Nerve root pain, Neuropathy, Non-obstructive CAD (coronary artery disease), Obesity, unspecified (2012), Personal history of tobacco use, presenting hazards to health (2012), Polycythemia, Rectal bleeding (02/11/2017), Recurrent Right Inguinal Hernia Repair (02/09/2011, 04/07/2013), Sleep apnea, Umbilical hernia without mention of obstruction or gangrene (02/09/2011), and Vitamin D deficiency.  PSH:    Past Surgical History:  Procedure Laterality Date  . BACK SURGERY  12/2008   X2-LUMBAR  . CARDIAC CATHETERIZATION N/A 05/30/2015   Procedure: Left Heart Cath;  Surgeon: Wellington Hampshire, MD;  Location: [place] [facility];  Service: Cardiovascular;  Laterality: N/A;  . CARDIAC CATHETERIZATION N/A 04/16/2016   Procedure: Left Heart Cath and Coronary Angiography;  Surgeon: Wellington Hampshire, MD;  Location: [place] [facility];  Service: Cardiovascular;  Laterality: N/A;  . COLONOSCOPY  2014   Dr. [name]  . COLONOSCOPY WITH PROPOFOL N/A 12/25/2016   Procedure: COLONOSCOPY WITH PROPOFOL;  Surgeon: Jonathon Bellows, MD;  Location: [facility];  Service: Endoscopy;  Laterality: N/A;  . COLONOSCOPY WITH PROPOFOL N/A 01/29/2017   Procedure: COLONOSCOPY WITH PROPOFOL;  Surgeon: Jonathon Bellows, MD;  Location: [facility];  Service: Endoscopy;  Laterality: N/A;  . ESOPHAGOGASTRODUODENOSCOPY (EGD) WITH PROPOFOL N/A 01/26/2019   Procedure: ESOPHAGOGASTRODUODENOSCOPY (EGD) WITH PROPOFOL;  Surgeon: Lin Landsman, MD;  Location: [facility];  Service: Gastroenterology;  Laterality: N/A;  . EVALUATION UNDER ANESTHESIA WITH HEMORRHOIDECTOMY N/A 02/24/2017   Procedure: EXAM UNDER ANESTHESIA WITH POSSIBLE EXCISION OF INTERNAL HEMORRHOIDS;  Surgeon: Olean Ree, MD;  Location: [facility];  Service: General;  Laterality: N/A;  . FISSURECTOMY  02/24/2017   Procedure: FISSURECTOMY;  Surgeon: Olean Ree, MD;  Location: [facility];  Service: General;;  . HERNIA REPAIR Right 1991  . INGUINAL HERNIA REPAIR Right 2012   Dr [name]  . INGUINAL HERNIA REPAIR Right 2014   Dr [name]    Current Outpatient Medications  Medication Sig Dispense Refill  . aspirin 81 MG tablet Take 81 mg by mouth daily.    Marland Kitchen atorvastatin (LIPITOR) 80 MG tablet Take 1 tablet (80 mg total) by mouth daily. 90 tablet 1  . blood glucose meter kit and supplies KIT Dispense based on patient and insurance preference. Use up to four times daily as directed. (FOR ICD-9 250.00, 250.01). 1 each 0  . carvedilol (COREG) 12.5 MG tablet TAKE 1 TABLET BY MOUTH TWICE DAILY 60 tablet 4  . cetirizine (ZYRTEC) 10 MG tablet Take 10 mg by mouth daily as needed for allergies.   1  . diclofenac sodium (VOLTAREN) 1 % GEL Apply 4 g topically 2 (two) times daily as needed. 100 g 5  . dicyclomine (BENTYL) 10 MG capsule TAKE 1 CAPSULE BY MOUTH THREE TIMES A DAY BEFORE MEALS 90 capsule 0  . ezetimibe (ZETIA) 10 MG tablet Take 1 tablet (10 mg total) by mouth daily. 90 tablet 1  . fluticasone (FLONASE) 50 MCG/ACT nasal spray Place 2 sprays into the nose daily as needed for allergies or rhinitis.     Marland Kitchen glucose blood test strip TID and prn 100 each 12  . ibuprofen (ADVIL) 800 MG tablet Take 800 mg by mouth every 6 (six) hours as needed.     .  Insulin Glargine (LANTUS) 100 UNIT/ML Solostar Pen Inject 28 Units into the skin daily. 5 pen 0  . Insulin Pen Needle 32G X 4 MM MISC 30 Units by Does not apply route daily. 90 each 1  . linagliptin (TRADJENTA) 5 MG TABS tablet Take 1 tablet (5 mg total) by mouth daily. 90 tablet 1  . lubiprostone (AMITIZA) 24 MCG capsule Take 1 capsule (24 mcg total) by mouth 2 (two) times daily with a meal. 60 capsule 0  . metFORMIN (GLUCOPHAGE) 500 MG tablet Take 1 tablet (500 mg total) by mouth 2 (two) times daily with a meal. 180 tablet 0  . NITROSTAT 0.4 MG SL tablet place 1 tablet under the tongue every 5 minutes for UP TO 3 doses if needed for angina as directed by prescriber 25 tablet 1  . RANEXA 1000 MG SR tablet TAKE 1 TABLET(1000 MG) BY MOUTH TWICE DAILY 180 tablet 0  . selenium sulfide (SELSUN) 2.5 % shampoo Apply 1 application topically daily as needed for irritation. 118 mL 12  . sildenafil (VIAGRA) 100 MG tablet Take 1 tablet (100 mg total) by mouth as needed for erectile dysfunction. 30 tablet 0  . valsartan (DIOVAN) 160 MG tablet Take 1 tablet (160 mg total) by mouth daily.  90 tablet 0   No current facility-administered medications for this visit.      Allergies:   Gabapentin, Latex, and Penicillins   Social History:  The patient  reports that he quit smoking about 14 years ago. His smoking use included cigars. He started smoking about 16 years ago. He quit after 2.00 years of use. He has never used smokeless tobacco. He reports current alcohol use of about 1.0 standard drinks of alcohol per week. He reports that he does not use drugs.   Family History:   family history includes Heart Problems in his mother; Heart Problems (age of onset: 52) in his father; Mental illness in his brother.    Review of Systems: Review of Systems  Constitutional: Negative.   HENT: Negative.   Respiratory: Negative.   Cardiovascular: Negative.   Gastrointestinal: Negative.   Musculoskeletal: Negative.    Neurological: Negative.   Psychiatric/Behavioral: Negative.   All other systems reviewed and are negative.    PHYSICAL EXAM: VS:  BP (!) 145/90 (BP Location: Left Arm, Patient Position: Sitting, Cuff Size: Normal)   Pulse (!) 58   Ht '5\' 11"'  (1.803 m)   Wt 277 lb 4 oz (125.8 kg)   BMI 38.67 kg/m  , BMI Body mass index is 38.67 kg/m. GEN: Well nourished, well developed, in no acute distress, obese HEENT: normal Neck: no JVD, carotid bruits, or masses Cardiac: RRR; no murmurs, rubs, or gallops,no edema  Respiratory:  clear to auscultation bilaterally, normal work of breathing GI: soft, nontender, nondistended, + BS MS: no deformity or atrophy Skin: warm and dry, no rash Neuro:  Strength and sensation are intact Psych: euthymic mood, full affect   Recent Labs: 09/28/2019: ALT 31; BUN 12; Creat 1.09; Hemoglobin 15.3; Platelets 257; Potassium 4.2; Sodium 137    Lipid Panel Lab Results  Component Value Date   CHOL 133 09/28/2019   HDL 42 09/28/2019   LDLCALC 68 09/28/2019   TRIG 146 09/28/2019      Wt Readings from Last 3 Encounters:  10/19/19 277 lb 4 oz (125.8 kg)  09/28/19 280 lb (127 kg)  06/12/19 252 lb (114.3 kg)     ASSESSMENT AND PLAN:  Problem List Items Addressed This Visit      Cardiology Problems   Stenosis of carotid artery   Essential hypertension (Chronic)   Hyperlipidemia (Chronic)   CAD (coronary artery disease) - Primary (Chronic)     CAD with stable angina Denies any anginal symptoms but is having some shortness of breath on exertion He is deconditioned, weight gain 30 pounds over the past year from sedentary lifestyle Discussed diet with him, need to restart his walking program, follow a low carbohydrate diet Recommended if symptoms of shortness of breath get worse that he call the office for further evaluation -Prior echocardiogram and stress testing 2018 and 2017 respectively were reviewed with him on today's  visit  Hyperlipidemia Cholesterol is at goal on the current lipid regimen. No changes to the medications were made.  Diabetes type 2 Hemoglobin A1c relatively well controlled in the 6 range  Essential hypertension Reports well-controlled numbers at home, was elevated on today's visit with mild improvement on recheck Recommended he continue to closely monitor blood pressure at home and call our office if numbers run high   Disposition:   F/U  3 to 6 months with Dr. Fletcher Anon.  He will call if shortness of breath does not improve   Total encounter time more than 25 minutes  Greater than 50% was spent in counseling and coordination of care with the patient    Signed, Esmond Plants, M.D., Ph.D. [place], [place]

## 2019-10-19 NOTE — Telephone Encounter (Signed)
Spoke wit the patient. Adv the patient that I have updated Dr. Fletcher Anon. He recommends the patient resume Hyzaar (Losartan HCTZ) 50-12.5mg  daily. An Rx has been sent to his pharmacy. Adv the patient to monitor his BP daily over the next 7 days and mychart the readings for Dr. Fletcher Anon to review. Patient verbalized understanding and voiced appreciation for the call.

## 2019-10-19 NOTE — Patient Instructions (Addendum)
We will call [facility] to find out valsartan combo pill dosing   Medication Instructions:  No changes  If you need a refill on your cardiac medications before your next appointment, please call your pharmacy.    Lab work: No new labs needed   If you have labs (blood work) drawn today and your tests are completely normal, you will receive your results only by: Marland Kitchen MyChart Message (if you have MyChart) OR . A paper copy in the mail If you have any lab test that is abnormal or we need to change your treatment, we will call you to review the results.   Testing/Procedures: No new testing needed   Follow-Up: At [facility], you and your health needs are our priority.  As part of our continuing mission to provide you with exceptional heart care, we have created designated Provider Care Teams.  These Care Teams include your primary Cardiologist (physician) and Advanced Practice Providers (APPs -  Physician Assistants and Nurse Practitioners) who all work together to provide you with the care you need, when you need it.  . You will need a follow up appointment in 3 to 6 months with Dr Fletcher Anon   . Providers on your designated Care Team:   . Murray Hodgkins, NP . Christell Faith, PA-C . Marrianne Mood, PA-C  Any Other Special Instructions Will Be Listed Below (If Applicable).  For educational health videos Log in to : [email or web address] Or : [email or web address], password : [email or web address]

## 2019-10-20 ENCOUNTER — Other Ambulatory Visit: Payer: Self-pay | Admitting: Family Medicine

## 2019-10-20 DIAGNOSIS — K5903 Drug induced constipation: Secondary | ICD-10-CM

## 2019-10-26 DIAGNOSIS — M1812 Unilateral primary osteoarthritis of first carpometacarpal joint, left hand: Secondary | ICD-10-CM | POA: Diagnosis not present

## 2019-10-26 DIAGNOSIS — M654 Radial styloid tenosynovitis [de Quervain]: Secondary | ICD-10-CM | POA: Diagnosis not present

## 2019-11-02 DIAGNOSIS — M654 Radial styloid tenosynovitis [de Quervain]: Secondary | ICD-10-CM | POA: Diagnosis not present

## 2019-11-02 DIAGNOSIS — M1812 Unilateral primary osteoarthritis of first carpometacarpal joint, left hand: Secondary | ICD-10-CM | POA: Diagnosis not present

## 2019-11-02 DIAGNOSIS — M5412 Radiculopathy, cervical region: Secondary | ICD-10-CM | POA: Diagnosis not present

## 2019-11-20 DIAGNOSIS — M1812 Unilateral primary osteoarthritis of first carpometacarpal joint, left hand: Secondary | ICD-10-CM | POA: Diagnosis not present

## 2019-11-20 DIAGNOSIS — M654 Radial styloid tenosynovitis [de Quervain]: Secondary | ICD-10-CM | POA: Diagnosis not present

## 2019-11-24 ENCOUNTER — Other Ambulatory Visit: Payer: Self-pay | Admitting: Family Medicine

## 2019-11-24 DIAGNOSIS — K5903 Drug induced constipation: Secondary | ICD-10-CM

## 2019-11-29 ENCOUNTER — Other Ambulatory Visit: Payer: Self-pay | Admitting: Family Medicine

## 2019-11-29 DIAGNOSIS — E1140 Type 2 diabetes mellitus with diabetic neuropathy, unspecified: Secondary | ICD-10-CM

## 2019-11-29 DIAGNOSIS — E1129 Type 2 diabetes mellitus with other diabetic kidney complication: Secondary | ICD-10-CM

## 2019-12-07 DIAGNOSIS — G5602 Carpal tunnel syndrome, left upper limb: Secondary | ICD-10-CM | POA: Diagnosis not present

## 2019-12-12 DIAGNOSIS — G5602 Carpal tunnel syndrome, left upper limb: Secondary | ICD-10-CM | POA: Diagnosis not present

## 2019-12-23 ENCOUNTER — Emergency Department: Payer: Medicaid Other

## 2019-12-23 ENCOUNTER — Other Ambulatory Visit: Payer: Self-pay

## 2019-12-23 ENCOUNTER — Encounter: Payer: Self-pay | Admitting: Emergency Medicine

## 2019-12-23 ENCOUNTER — Emergency Department
Admission: EM | Admit: 2019-12-23 | Discharge: 2019-12-23 | Disposition: A | Discharge status: Home / Self Care | Payer: Medicaid Other | Attending: Emergency Medicine | Admitting: Emergency Medicine

## 2019-12-23 DIAGNOSIS — I10 Essential (primary) hypertension: Secondary | ICD-10-CM | POA: Diagnosis not present

## 2019-12-23 DIAGNOSIS — E119 Type 2 diabetes mellitus without complications: Secondary | ICD-10-CM | POA: Insufficient documentation

## 2019-12-23 DIAGNOSIS — S91209A Unspecified open wound of unspecified toe(s) with damage to nail, initial encounter: Secondary | ICD-10-CM

## 2019-12-23 DIAGNOSIS — Z87891 Personal history of nicotine dependence: Secondary | ICD-10-CM | POA: Insufficient documentation

## 2019-12-23 DIAGNOSIS — Z7984 Long term (current) use of oral hypoglycemic drugs: Secondary | ICD-10-CM | POA: Insufficient documentation

## 2019-12-23 DIAGNOSIS — Z91040 Latex allergy status: Secondary | ICD-10-CM | POA: Diagnosis not present

## 2019-12-23 DIAGNOSIS — W228XXA Striking against or struck by other objects, initial encounter: Secondary | ICD-10-CM | POA: Insufficient documentation

## 2019-12-23 DIAGNOSIS — S99921A Unspecified injury of right foot, initial encounter: Secondary | ICD-10-CM | POA: Diagnosis not present

## 2019-12-23 DIAGNOSIS — Y939 Activity, unspecified: Secondary | ICD-10-CM | POA: Insufficient documentation

## 2019-12-23 DIAGNOSIS — Y999 Unspecified external cause status: Secondary | ICD-10-CM | POA: Insufficient documentation

## 2019-12-23 DIAGNOSIS — S91201A Unspecified open wound of right great toe with damage to nail, initial encounter: Secondary | ICD-10-CM | POA: Insufficient documentation

## 2019-12-23 DIAGNOSIS — Y929 Unspecified place or not applicable: Secondary | ICD-10-CM | POA: Diagnosis not present

## 2019-12-23 DIAGNOSIS — Z7982 Long term (current) use of aspirin: Secondary | ICD-10-CM | POA: Diagnosis not present

## 2019-12-23 DIAGNOSIS — I25118 Atherosclerotic heart disease of native coronary artery with other forms of angina pectoris: Secondary | ICD-10-CM | POA: Diagnosis not present

## 2019-12-23 MED ORDER — SULFAMETHOXAZOLE-TRIMETHOPRIM 800-160 MG PO TABS: 1.0000 | ORAL_TABLET | Freq: Two times a day (BID) | ORAL | 0 refills | Status: DC

## 2019-12-23 MED ORDER — LIDOCAINE HCL (PF) 1 % IJ SOLN
5.0000 mL | Freq: Once | INTRAMUSCULAR | Status: AC
  Administered 2019-12-23: 5 mL
  Filled 2019-12-23: qty 5

## 2019-12-23 MED ORDER — TRAMADOL HCL 50 MG PO TABS: 50.0000 mg | ORAL_TABLET | Freq: Four times a day (QID) | ORAL | 0 refills | Status: AC | PRN

## 2019-12-23 MED ORDER — BACITRACIN-NEOMYCIN-POLYMYXIN 400-5-5000 EX OINT
TOPICAL_OINTMENT | Freq: Once | CUTANEOUS | Status: AC
  Administered 2019-12-23: 1 via TOPICAL
  Filled 2019-12-23: qty 1

## 2019-12-23 MED ORDER — NEOSPORIN PLUS PAIN RELIEF MS 3.5-10000-10 EX CREA: TOPICAL_CREAM | Freq: Two times a day (BID) | CUTANEOUS | 0 refills | Status: DC

## 2019-12-23 NOTE — ED Triage Notes (Signed)
Pt to ED via POV. Pt states that he hit his right great toe yesterday and now the toe nail is hanging off. Pt is concerned because he is diabetic. Pt is in NAD.

## 2019-12-23 NOTE — ED Provider Notes (Signed)
[facility] Emergency Department Provider Note   ____________________________________________   First MD Initiated Contact with Patient 12/23/19 [phone]     (approximate)  I have reviewed the triage vital signs and the nursing notes.   HISTORY  Chief Complaint Toe Injury    HPI TEO MOEDE is a 58 y.o. male patient presents with right great toe pain secondary to contusion.  Patient states stoped his toe on a cement block.  Patient has a partially involved toenail.  Patient rates pain as a 7/10.  Patient described the pain as "achy".  Patient is diabetic.         Past Medical History:  Diagnosis Date  . Allergy   . Angina, class III ([place])   . Chronic constipation   . Degeneration of intervertebral disc of cervical region   . Diabetes mellitus without complication ([place])   . Diastolic dysfunction    a. 06/2017 Echo: EF 60-65%, no rwma, Gr1 DD.  Marland Kitchen Dyspnea   . GERD (gastroesophageal reflux disease)   . Hernia 1991   02/10/2012-RIH repair  . Hyperlipidemia   . Hypertension 2008  . Nerve root pain   . Neuropathy   . Non-obstructive CAD (coronary artery disease)    a. 05/30/2015 cath: LM nl, mLAD 50% (FFR 0.83), LCx minor irregs, RCA minor irregs, EF 55-65%-->Med Rx; b. 03/2016 Cath: LM nl LAD 88m D1/2/3 min irregs, LCX min irregs, OM1/2/3 nl, RCA min irregs, RPDA/RPAV/RPL1/RPL2 nl-->Med Rx.  . Obesity, unspecified 2012  . Personal history of tobacco use, presenting hazards to health 2012  . Polycythemia   . Rectal bleeding 02/11/2017  . Recurrent Right Inguinal Hernia Repair 02/09/2011, 04/07/2013   [facility] . Sleep apnea    a. On CPAP;  b. 06/2017 Echo: no PAH.  .Marland KitchenUmbilical hernia without mention of obstruction or gangrene 02/09/2011   [facility] . Vitamin D deficiency     Patient Active Problem List   Diagnosis Date Noted  . Localized, primary osteoarthritis of hand 09/28/2019  . Seizure ([place] 02/22/2018  . Mild cognitive  impairment 11/17/2017  . Radiculopathy of cervical region 11/17/2017  . Stenosis of carotid artery 09/08/2017  . Cervical stenosis of spine 09/08/2017  . B12 deficiency 06/28/2017  . Headache disorder 02/15/2017  . Memory loss or impairment 02/15/2017  . Coronary artery disease involving native coronary artery of native heart   . Radiculitis of left cervical region 03/05/2016  . Diabetic neuropathy associated with type 2 diabetes mellitus ([place] 12/05/2015  . Patellar subluxation 06/05/2015  . Allergic rhinitis, seasonal 06/05/2015  . Chronic constipation 06/05/2015  . Chronic lower back pain 06/05/2015  . Morbid obesity due to excess calories ([place] 06/05/2015  . Vitamin D deficiency 06/05/2015  . Central sleep apnea 06/05/2015  . 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 Nerve root pain 06/05/2015  . Acquired polycythemia 06/05/2015  . Anterior knee pain 06/05/2015  . Dysmetabolic syndrome 044/12/270 . Failure of erection 06/05/2015  . Gastro-esophageal reflux disease without esophagitis 06/05/2015  . Neuropathy 06/05/2015  . CAD (coronary artery disease)   . Angina, class III ([place] 05/23/2015  . Essential hypertension 05/23/2015  . Hyperlipidemia 05/23/2015  . Inguinal hernia without mention of obstruction or gangrene, recurrent unilateral or unspecified 03/24/2013    Past Surgical History:  Procedure Laterality Date  . BACK SURGERY  12/2008   X2-LUMBAR  . CARDIAC CATHETERIZATION N/A 05/30/2015   Procedure: Left Heart Cath;  Surgeon: MWellington Hampshire MD;  Location: [place] CV LAB;  Service: Cardiovascular;  Laterality: N/A;  . CARDIAC CATHETERIZATION N/A 04/16/2016   Procedure: Left Heart Cath and Coronary Angiography;  Surgeon: Wellington Hampshire, MD;  Location: [place] CV LAB;  Service: Cardiovascular;  Laterality: N/A;  . COLONOSCOPY  2014   Dr. [name]  . COLONOSCOPY WITH PROPOFOL N/A 12/25/2016   Procedure: COLONOSCOPY WITH PROPOFOL;  Surgeon: Jonathon Bellows, MD;  Location:  [facility] ENDOSCOPY;  Service: Endoscopy;  Laterality: N/A;  . COLONOSCOPY WITH PROPOFOL N/A 01/29/2017   Procedure: COLONOSCOPY WITH PROPOFOL;  Surgeon: Jonathon Bellows, MD;  Location: [facility] ENDOSCOPY;  Service: Endoscopy;  Laterality: N/A;  . ESOPHAGOGASTRODUODENOSCOPY (EGD) WITH PROPOFOL N/A 01/26/2019   Procedure: ESOPHAGOGASTRODUODENOSCOPY (EGD) WITH PROPOFOL;  Surgeon: Lin Landsman, MD;  Location: [facility] ENDOSCOPY;  Service: Gastroenterology;  Laterality: N/A;  . EVALUATION UNDER ANESTHESIA WITH HEMORRHOIDECTOMY N/A 02/24/2017   Procedure: EXAM UNDER ANESTHESIA WITH POSSIBLE EXCISION OF INTERNAL HEMORRHOIDS;  Surgeon: Olean Ree, MD;  Location: [facility] ORS;  Service: General;  Laterality: N/A;  . FISSURECTOMY  02/24/2017   Procedure: FISSURECTOMY;  Surgeon: Olean Ree, MD;  Location: [facility] ORS;  Service: General;;  . HAND SURGERY Left 2020  . HERNIA REPAIR Right 1991  . INGUINAL HERNIA REPAIR Right 2012   Dr [name]  . INGUINAL HERNIA REPAIR Right 2014   Dr [name]    Prior to Admission medications   Medication Sig Start Date End Date Taking? Authorizing Provider  AMITIZA 24 MCG capsule TAKE 1 CAPSULE(24 MCG) BY MOUTH TWICE DAILY WITH A MEAL 11/24/19   Steele Sizer, MD  aspirin 81 MG tablet Take 81 mg by mouth daily.    [provider]  atorvastatin (LIPITOR) 80 MG tablet Take 1 tablet (80 mg total) by mouth daily. 06/12/19   Steele Sizer, MD  blood glucose meter kit and supplies KIT Dispense based on patient and insurance preference. Use up to four times daily as directed. (FOR ICD-9 250.00, 250.01). 01/27/19   Gouru, Aruna, MD  carvedilol (COREG) 12.5 MG tablet Take 1 tablet (12.5 mg total) by mouth 2 (two) times daily. 10/19/19   Wellington Hampshire, MD  cetirizine (ZYRTEC) 10 MG tablet Take 10 mg by mouth daily as needed for allergies.  07/09/16   [provider]  diclofenac sodium (VOLTAREN) 1 % GEL Apply 4 g topically 2 (two) times daily as needed. 12/30/18   Steele Sizer,  MD  dicyclomine (BENTYL) 10 MG capsule TAKE 1 CAPSULE BY MOUTH THREE TIMES A DAY BEFORE MEALS 04/27/18   Jonathon Bellows, MD  ezetimibe (ZETIA) 10 MG tablet Take 1 tablet (10 mg total) by mouth daily. 06/12/19   Steele Sizer, MD  fluticasone (FLONASE) 50 MCG/ACT nasal spray Place 2 sprays into the nose daily as needed for allergies or rhinitis.  03/05/15   [provider]  glucose blood test strip TID and prn 02/20/19   Steele Sizer, MD  ibuprofen (ADVIL) 800 MG tablet Take 800 mg by mouth every 6 (six) hours as needed.  09/11/19   [provider]  Insulin Pen Needle 32G X 4 MM MISC 30 Units by Does not apply route daily. 07/16/19   Steele Sizer, MD  LANTUS SOLOSTAR 100 UNIT/ML Solostar Pen INJECT 28 UNITS INTO THE SKIN DAILY 11/29/19   Ancil Boozer, Drue Stager, MD  linagliptin (TRADJENTA) 5 MG TABS tablet Take 1 tablet (5 mg total) by mouth daily. 06/12/19   Steele Sizer, MD  losartan-hydrochlorothiazide (HYZAAR) 50-12.5 MG tablet Take 1 tablet by mouth  daily. 10/19/19   Wellington Hampshire, MD  metFORMIN (GLUCOPHAGE) 500 MG tablet Take 1 tablet (500 mg total) by mouth 2 (two) times daily with a meal. 06/12/19   Sowles, Drue Stager, MD  neomycin-polymyxin-pramoxine (NEOSPORIN PLUS) 1 % cream Apply topically 2 (two) times daily. 12/23/19   Sable Feil, PA-C  NITROSTAT 0.4 MG SL tablet place 1 tablet under the tongue every 5 minutes for UP TO 3 doses if needed for angina as directed by prescriber 01/15/17   Wellington Hampshire, MD  ranolazine (RANEXA) 1000 MG SR tablet TAKE 1 TABLET(1000 MG) BY MOUTH TWICE DAILY 10/19/19   Wellington Hampshire, MD  selenium sulfide (SELSUN) 2.5 % shampoo Apply 1 application topically daily as needed for irritation. 06/21/18   Steele Sizer, MD  sildenafil (VIAGRA) 100 MG tablet Take 1 tablet (100 mg total) by mouth as needed for erectile dysfunction. 09/28/19   Steele Sizer, MD  sulfamethoxazole-trimethoprim (BACTRIM DS) 800-160 MG tablet Take 1 tablet by mouth 2  (two) times daily. 12/23/19   Sable Feil, PA-C  traMADol (ULTRAM) 50 MG tablet Take 1 tablet (50 mg total) by mouth every 6 (six) hours as needed for up to 5 days. 12/23/19 12/28/19  Sable Feil, PA-C    Allergies Gabapentin, Latex, and Penicillins  Family History  Problem Relation Age of Onset  . Heart Problems Mother   . Heart Problems Father 40       myocardial infarction  . Mental illness Brother     Social History Social History   Tobacco Use  . Smoking status: Former Smoker    Years: 2.00    Types: Cigars    Start date: 12/28/2002    Quit date: 06/04/2005    Years since quitting: 14.5  . Smokeless tobacco: Never Used  Substance Use Topics  . Alcohol use: Yes    Alcohol/week: 1.0 standard drinks    Types: 1 Glasses of wine per week    Comment: [employer]  . Drug use: No    Review of Systems Constitutional: No fever/chills Eyes: No visual changes. ENT: No sore throat. Cardiovascular: Denies chest pain. Respiratory: Denies shortness of breath. Gastrointestinal: No abdominal pain.  No nausea, no vomiting.  No diarrhea.  No constipation. Genitourinary: Negative for dysuria. Musculoskeletal: Negative for back pain. Skin: Negative for rash.  Neurological: Negative for headaches, focal weakness or numbness. Endocrine:  Diabetes, hyperlipidemia, and hypertension. Allergic/Immunilogical: Gabapentin, latex, penicillin. ____________________________________________   PHYSICAL EXAM:  VITAL SIGNS: ED Triage Vitals  Enc Vitals Group     BP 12/23/19 0902 (!) 170/92     Pulse Rate 12/23/19 0902 70     Resp 12/23/19 0902 16     Temp 12/23/19 0902 98 F (36.7 C)     Temp Source 12/23/19 0902 Oral     SpO2 12/23/19 0902 98 %     Weight 12/23/19 0900 262 lb (118.8 kg)     Height 12/23/19 0900 '5\' 11"'  (1.803 m)     Head Circumference --      Peak Flow --      Pain Score 12/23/19 0900 7     Pain Loc --      Pain Edu? --      Excl. in [place]? --     Constitutional:  Alert and oriented. Well appearing and in no acute distress. Cardiovascular: Normal rate, regular rhythm. Grossly normal heart sounds.  Good peripheral circulation.  Evaded blood pressure. Respiratory: Normal respiratory effort.  No  retractions. Lungs CTAB. Musculoskeletal: No obvious deformity to the right great toe.  Neurologic:  Normal speech and language. No gross focal neurologic deficits are appreciated. No gait instability. Skin:  Skin is warm, dry and intact. No rash noted.  Partial nail avulsion right great toe. Psychiatric: Mood and affect are normal. Speech and behavior are normal.  ____________________________________________   LABS (all labs ordered are listed, but only abnormal results are displayed)  Labs Reviewed - No data to display ____________________________________________  EKG   ____________________________________________  RADIOLOGY  ED MD interpretation:    Official radiology report(s): DG Toe Great Right  Result Date: 12/23/2019 CLINICAL DATA:  Right great toe injury. EXAM: RIGHT GREAT TOE COMPARISON:  None. FINDINGS: There is no evidence of fracture or dislocation. Degenerative joint changes of the right great toe interphalangeal joint with osteophyte formation in narrowed joint space are noted. Soft tissues are unremarkable. IMPRESSION: No acute fracture or dislocation. Degenerative joint changes of the right great toe interphalangeal joint. Electronically Signed   By: Abelardo Diesel M.D.   On: 12/23/2019 10:11    ____________________________________________   PROCEDURES  Procedure(s) performed (including Critical Care):  Procedures   ____________________________________________   INITIAL IMPRESSION / ASSESSMENT AND PLAN / ED COURSE  As part of my medical decision making, I reviewed the following data within the [place]     Patient presents with right great toe pain secondary to contusion.  Patient has a partial avulsed  toenail.  Discussed negative x-ray findings with patient.  See procedure note for toenail removal.  Patient given discharge care instruction advised take medication as directed.  Patient advised to follow-up podiatry.    [name] [name] was evaluated in Emergency Department on 12/23/2019 for the symptoms described in the history of present illness. He was evaluated in the context of the global COVID-19 pandemic, which necessitated consideration that the patient might be at risk for infection with the SARS-CoV-2 virus that causes COVID-19. Institutional protocols and algorithms that pertain to the evaluation of patients at risk for COVID-19 are in a state of rapid change based on information released by regulatory bodies including the CDC and federal and state organizations. These policies and algorithms were followed during the patient's care in the ED.       ____________________________________________   FINAL CLINICAL IMPRESSION(S) / ED DIAGNOSES  Final diagnoses:  Avulsion of toenail, initial encounter     ED Discharge Orders         Ordered    sulfamethoxazole-trimethoprim (BACTRIM DS) 800-160 MG tablet  2 times daily     12/23/19 1057    neomycin-polymyxin-pramoxine (NEOSPORIN PLUS) 1 % cream  2 times daily     12/23/19 1057    traMADol (ULTRAM) 50 MG tablet  Every 6 hours PRN     12/23/19 1057           Note:  This document was prepared using Dragon voice recognition software and may include unintentional dictation errors.    Sable Feil, PA-C 12/23/19 1059    Harvest Dark, MD 12/23/19 1446

## 2019-12-23 NOTE — ED Notes (Signed)
Sterile dressing applied to right great toe. Pt given instructions as to signs of infection. Pt states understanding.

## 2019-12-23 NOTE — ED Notes (Signed)
See triage note  Presents with toe injury  States he hit his right great toes yesterday

## 2020-01-01 DIAGNOSIS — M79675 Pain in left toe(s): Secondary | ICD-10-CM | POA: Diagnosis not present

## 2020-01-01 DIAGNOSIS — E119 Type 2 diabetes mellitus without complications: Secondary | ICD-10-CM | POA: Diagnosis not present

## 2020-01-01 DIAGNOSIS — B351 Tinea unguium: Secondary | ICD-10-CM | POA: Diagnosis not present

## 2020-01-01 DIAGNOSIS — M79674 Pain in right toe(s): Secondary | ICD-10-CM | POA: Diagnosis not present

## 2020-01-18 ENCOUNTER — Encounter: Payer: Self-pay | Admitting: Cardiovascular Disease

## 2020-01-18 ENCOUNTER — Other Ambulatory Visit: Payer: Self-pay

## 2020-01-18 ENCOUNTER — Ambulatory Visit (INDEPENDENT_AMBULATORY_CARE_PROVIDER_SITE_OTHER): Payer: Medicaid Other | Admitting: Cardiovascular Disease

## 2020-01-18 VITALS — BP 132/94 | HR 65 | Ht 71.0 in | Wt 270.0 lb

## 2020-01-18 DIAGNOSIS — I10 Essential (primary) hypertension: Secondary | ICD-10-CM | POA: Diagnosis not present

## 2020-01-18 DIAGNOSIS — E782 Mixed hyperlipidemia: Secondary | ICD-10-CM

## 2020-01-18 DIAGNOSIS — I2510 Atherosclerotic heart disease of native coronary artery without angina pectoris: Secondary | ICD-10-CM

## 2020-01-18 NOTE — Patient Instructions (Signed)

## 2020-01-18 NOTE — Progress Notes (Signed)
Cardiology Office Note   Date:  01/18/2020   ID:  [name] [name], [name] [name] [birth year], MRN [id number]  PCP:  Steele Sizer, MD  Cardiologist:   [name], MD   Chief Complaint  Patient presents with  . Other    3 month follow up; Meds reviewed verbally with patient.       History of Present Illness: [name] [name] is a 59 y.o. male who presents for a follow-up visit regarding mild -moderate one-vessel coronary artery disease with stable angina and suspected endothelial dysfunction. He has known history of type 2 diabetes, hypertension, hyperlipidemia and obesity. He is a previous smoker. Cardiac catheterization in June 2016 showed moderate mid LAD stenosis (50%) with an FFR ratio of 0.83 . LV systolic function with normal with normal left ventricular end-diastolic pressure. He had worsening angina in April 2017. Repeat cardiac catheterization showed improvement in mid LAD stenosis to 30% and no evidence of obstructive coronary artery disease.  He does have sleep apnea on CPAP . The patient had recurrent episodes of loss of consciousness of unclear etiology.  Echocardiogram showed normal ejection fraction.  2-week outpatient monitor showed no significant arrhythmia.  It was felt that his episodes are not cardiac in origin and he has been evaluated by neurology but no clear etiology was identified according to him.    The patient did have recurrent concussions growing up when he played sports.  He has been doing well with no recent hospitalizations.  He denies chest pain or worsening dyspnea.  He tries to exercise daily at home.  He does both cardio and weightlifting.  He has no exertional symptoms.      Past Medical History:  Diagnosis Date  . Allergy   . Angina, class III ([place])   . Chronic constipation   . Degeneration of intervertebral disc of cervical region   . Diabetes mellitus without complication ([place])   . Diastolic dysfunction    a. 06/2017 Echo: EF 60-65%, no rwma,  Gr1 DD.  Marland Kitchen Dyspnea   . GERD (gastroesophageal reflux disease)   . Hernia 1991   02/10/2012-RIH repair  . Hyperlipidemia   . Hypertension 2008  . Nerve root pain   . Neuropathy   . Non-obstructive CAD (coronary artery disease)    a. 05/30/2015 cath: LM nl, mLAD 50% (FFR 0.83), LCx minor irregs, RCA minor irregs, EF 55-65%-->Med Rx; b. 03/2016 Cath: LM nl LAD 10m D1/2/3 min irregs, LCX min irregs, OM1/2/3 nl, RCA min irregs, RPDA/RPAV/RPL1/RPL2 nl-->Med Rx.  . Obesity, unspecified 2012  . Personal history of tobacco use, presenting hazards to health 2012  . Polycythemia   . Rectal bleeding 02/11/2017  . Recurrent Right Inguinal Hernia Repair 02/09/2011, 04/07/2013   [facility] . Sleep apnea    a. On CPAP;  b. 06/2017 Echo: no PAH.  .Marland KitchenUmbilical hernia without mention of obstruction or gangrene 02/09/2011   [facility] . Vitamin D deficiency     Past Surgical History:  Procedure Laterality Date  . BACK SURGERY  12/2008   X2-LUMBAR  . CARDIAC CATHETERIZATION N/A 05/30/2015   Procedure: Left Heart Cath;  Surgeon: MWellington Hampshire MD;  [place];  Service: Cardiovascular;  Laterality: N/A;  . CARDIAC CATHETERIZATION N/A 04/16/2016   Procedure: Left Heart Cath and Coronary Angiography;  Surgeon: MWellington Hampshire MD;  [place];  Service: Cardiovascular;  Laterality: N/A;  . COLONOSCOPY  2014   Dr.  Sankar  . COLONOSCOPY WITH PROPOFOL N/A 12/25/2016   Procedure: COLONOSCOPY WITH PROPOFOL;  Surgeon: Jonathon Bellows, MD;  Location: [facility] ENDOSCOPY;  Service: Endoscopy;  Laterality: N/A;  . COLONOSCOPY WITH PROPOFOL N/A 01/29/2017   Procedure: COLONOSCOPY WITH PROPOFOL;  Surgeon: Jonathon Bellows, MD;  Location: [facility] ENDOSCOPY;  Service: Endoscopy;  Laterality: N/A;  . ESOPHAGOGASTRODUODENOSCOPY (EGD) WITH PROPOFOL N/A 01/26/2019   Procedure: ESOPHAGOGASTRODUODENOSCOPY (EGD) WITH PROPOFOL;  Surgeon: Lin Landsman, MD;  [facility] ENDOSCOPY;  Service:  Gastroenterology;  Laterality: N/A;  . EVALUATION UNDER ANESTHESIA WITH HEMORRHOIDECTOMY N/A 02/24/2017   Procedure: EXAM UNDER ANESTHESIA WITH POSSIBLE EXCISION OF INTERNAL HEMORRHOIDS;  Surgeon: Olean Ree, MD;  Location: [facility] ORS;  Service: General;  Laterality: N/A;  . FISSURECTOMY  02/24/2017   Procedure: FISSURECTOMY;  Surgeon: Olean Ree, MD;  Location: [facility] ORS;  Service: General;;  . HAND SURGERY Left 2020  . HERNIA REPAIR Right 1991  . INGUINAL HERNIA REPAIR Right 2012   Dr [name]  . INGUINAL HERNIA REPAIR Right 2014   Dr [name]     Current Outpatient Medications  Medication Sig Dispense Refill  . AMITIZA 24 MCG capsule TAKE 1 CAPSULE(24 MCG) BY MOUTH TWICE DAILY WITH A MEAL 60 capsule 0  . aspirin 81 MG tablet Take 81 mg by mouth daily.    Marland Kitchen atorvastatin (LIPITOR) 80 MG tablet Take 1 tablet (80 mg total) by mouth daily. 90 tablet 1  . blood glucose meter kit and supplies KIT Dispense based on patient and insurance preference. Use up to four times daily as directed. (FOR ICD-9 250.00, 250.01). 1 each 0  . carvedilol (COREG) 12.5 MG tablet Take 1 tablet (12.5 mg total) by mouth 2 (two) times daily. 180 tablet 3  . cetirizine (ZYRTEC) 10 MG tablet Take 10 mg by mouth daily as needed for allergies.   1  . diclofenac sodium (VOLTAREN) 1 % GEL Apply 4 g topically 2 (two) times daily as needed. 100 g 5  . dicyclomine (BENTYL) 10 MG capsule TAKE 1 CAPSULE BY MOUTH THREE TIMES A DAY BEFORE MEALS 90 capsule 0  . ezetimibe (ZETIA) 10 MG tablet Take 1 tablet (10 mg total) by mouth daily. 90 tablet 1  . fluticasone (FLONASE) 50 MCG/ACT nasal spray Place 2 sprays into the nose daily as needed for allergies or rhinitis.     Marland Kitchen glucose blood test strip TID and prn 100 each 12  . Insulin Pen Needle 32G X 4 MM MISC 30 Units by Does not apply route daily. 90 each 1  . LANTUS SOLOSTAR 100 UNIT/ML Solostar Pen INJECT 28 UNITS INTO THE SKIN DAILY 15 mL 0  . linagliptin (TRADJENTA) 5 MG TABS tablet  Take 1 tablet (5 mg total) by mouth daily. 90 tablet 1  . losartan-hydrochlorothiazide (HYZAAR) 50-12.5 MG tablet Take 1 tablet by mouth daily. 90 tablet 1  . metFORMIN (GLUCOPHAGE) 500 MG tablet Take 1 tablet (500 mg total) by mouth 2 (two) times daily with a meal. 180 tablet 0  . neomycin-polymyxin-pramoxine (NEOSPORIN PLUS) 1 % cream Apply topically 2 (two) times daily. 14.2 g 0  . NITROSTAT 0.4 MG SL tablet place 1 tablet under the tongue every 5 minutes for UP TO 3 doses if needed for angina as directed by prescriber 25 tablet 1  . ranolazine (RANEXA) 1000 MG SR tablet TAKE 1 TABLET(1000 MG) BY MOUTH TWICE DAILY 180 tablet 3  . selenium sulfide (SELSUN) 2.5 % shampoo Apply 1 application topically daily as needed for  irritation. 118 mL 12  . sildenafil (VIAGRA) 100 MG tablet Take 1 tablet (100 mg total) by mouth as needed for erectile dysfunction. 30 tablet 0  . sulfamethoxazole-trimethoprim (BACTRIM DS) 800-160 MG tablet Take 1 tablet by mouth 2 (two) times daily. 20 tablet 0   No current facility-administered medications for this visit.    Allergies:   Gabapentin, Latex, and Penicillins    Social History:  The patient  reports that he quit smoking about 14 years ago. His smoking use included cigars. He started smoking about 17 years ago. He quit after 2.00 years of use. He has never used smokeless tobacco. He reports current alcohol use of about 1.0 standard drinks of alcohol per week. He reports that he does not use drugs.   Family History:  The patient's family history includes Heart Problems in his mother; Heart Problems (age of onset: 49) in his father; Mental illness in his brother.    ROS:  Please see the history of present illness.   Otherwise, review of systems are positive for none.   All other systems are reviewed and negative.    PHYSICAL EXAM: VS:  BP (!) 132/94 (BP Location: Left Arm, Patient Position: Sitting, Cuff Size: Large)   Pulse 65   Ht '5\' 11"'  (1.803 m)   Wt 270  lb (122.5 kg)   BMI 37.66 kg/m  , BMI Body mass index is 37.66 kg/m. GEN: Well nourished, well developed, in no acute distress  HEENT: normal  Neck: no JVD, carotid bruits, or masses Cardiac: RRR; no murmurs, rubs, or gallops,no edema  Respiratory:  clear to auscultation bilaterally, normal work of breathing GI: soft, nontender, nondistended, + BS MS: no deformity or atrophy  Skin: warm and dry, no rash Neuro:  Strength and sensation are intact Psych: euthymic mood, full affect   EKG:  EKG is  ordered today.  EKG shows normal sinus rhythm with no significant ST or T wave changes.  Recent Labs: 09/28/2019: ALT 31; BUN 12; Creat 1.09; Hemoglobin 15.3; Platelets 257; Potassium 4.2; Sodium 137    Lipid Panel    Component Value Date/Time   CHOL 133 09/28/2019 0000   CHOL 167 05/08/2016 0803   TRIG 146 09/28/2019 0000   HDL 42 09/28/2019 0000   HDL 52 05/08/2016 0803   CHOLHDL 3.2 09/28/2019 0000   LDLCALC 68 09/28/2019 0000      Wt Readings from Last 3 Encounters:  01/18/20 270 lb (122.5 kg)  12/23/19 262 lb (118.8 kg)  10/19/19 277 lb 4 oz (125.8 kg)        ASSESSMENT AND PLAN:  1.  Coronary artery disease involving native coronary arteries with other forms of angina:  The patient has suspected endothelial dysfunction. Cardiac catheterization twice showed no evidence of obstructive coronary artery disease.    His symptoms are well controlled on antianginal medications including carvedilol and Ranexa.  He reports significant chest discomfort if he does not take Ranexa.    2. Hyperlipidemia:  Continue treatment with high dose atorvastatin.  Most recent lipid profile showed an LDL of 68 and triglyceride of 146.  3. Essential hypertension: Blood pressure is well controlled.  4.  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  Suspected neurologic etiology.   Disposition:   FU with me in 6 months  Signed,  [name], MD   01/18/2020 11:04 AM    [place]

## 2020-01-20 ENCOUNTER — Other Ambulatory Visit: Payer: Self-pay | Admitting: Family Medicine

## 2020-01-20 DIAGNOSIS — E1129 Type 2 diabetes mellitus with other diabetic kidney complication: Secondary | ICD-10-CM

## 2020-01-20 DIAGNOSIS — E782 Mixed hyperlipidemia: Secondary | ICD-10-CM

## 2020-01-20 DIAGNOSIS — E1140 Type 2 diabetes mellitus with diabetic neuropathy, unspecified: Secondary | ICD-10-CM

## 2020-01-25 DIAGNOSIS — M5030 Other cervical disc degeneration, unspecified cervical region: Secondary | ICD-10-CM | POA: Diagnosis not present

## 2020-01-25 DIAGNOSIS — G5602 Carpal tunnel syndrome, left upper limb: Secondary | ICD-10-CM | POA: Diagnosis not present

## 2020-01-25 DIAGNOSIS — M5412 Radiculopathy, cervical region: Secondary | ICD-10-CM | POA: Diagnosis not present

## 2020-01-29 ENCOUNTER — Telehealth: Payer: Medicaid Other | Admitting: Family Medicine

## 2020-01-29 ENCOUNTER — Ambulatory Visit: Payer: Medicaid Other | Admitting: Family Medicine

## 2020-01-29 ENCOUNTER — Ambulatory Visit (INDEPENDENT_AMBULATORY_CARE_PROVIDER_SITE_OTHER): Payer: Medicaid Other | Admitting: Family Medicine

## 2020-01-29 ENCOUNTER — Encounter: Payer: Self-pay | Admitting: Family Medicine

## 2020-01-29 VITALS — BP 125/83 | HR 63 | Ht 71.0 in | Wt 262.0 lb

## 2020-01-29 DIAGNOSIS — E1140 Type 2 diabetes mellitus with diabetic neuropathy, unspecified: Secondary | ICD-10-CM

## 2020-01-29 DIAGNOSIS — R569 Unspecified convulsions: Secondary | ICD-10-CM

## 2020-01-29 DIAGNOSIS — I2583 Coronary atherosclerosis due to lipid rich plaque: Secondary | ICD-10-CM | POA: Diagnosis not present

## 2020-01-29 DIAGNOSIS — N528 Other male erectile dysfunction: Secondary | ICD-10-CM

## 2020-01-29 DIAGNOSIS — E782 Mixed hyperlipidemia: Secondary | ICD-10-CM

## 2020-01-29 DIAGNOSIS — I10 Essential (primary) hypertension: Secondary | ICD-10-CM | POA: Diagnosis not present

## 2020-01-29 DIAGNOSIS — I2510 Atherosclerotic heart disease of native coronary artery without angina pectoris: Secondary | ICD-10-CM

## 2020-01-29 DIAGNOSIS — I25118 Atherosclerotic heart disease of native coronary artery with other forms of angina pectoris: Secondary | ICD-10-CM

## 2020-01-29 DIAGNOSIS — E1129 Type 2 diabetes mellitus with other diabetic kidney complication: Secondary | ICD-10-CM | POA: Diagnosis not present

## 2020-01-29 DIAGNOSIS — R809 Proteinuria, unspecified: Secondary | ICD-10-CM

## 2020-01-29 LAB — POCT GLYCOSYLATED HEMOGLOBIN (HGB A1C): HbA1c, POC (controlled diabetic range): 6.7 % (ref 0.0–7.0)

## 2020-01-29 MED ORDER — SILDENAFIL CITRATE 100 MG PO TABS: 100.0000 mg | ORAL_TABLET | ORAL | 0 refills | Status: DC | PRN

## 2020-01-29 MED ORDER — LANTUS SOLOSTAR 100 UNIT/ML ~~LOC~~ SOPN: PEN_INJECTOR | SUBCUTANEOUS | 0 refills | Status: DC

## 2020-01-29 MED ORDER — ATORVASTATIN CALCIUM 80 MG PO TABS: 80.0000 mg | ORAL_TABLET | Freq: Every day | ORAL | 1 refills | Status: DC

## 2020-01-29 NOTE — Progress Notes (Signed)
Name: [name] [name]   MRN: [id number]    DOB: [birth year]   Date:01/29/2020       Progress Note  Subjective  Chief Complaint  Chief Complaint  Patient presents with  . Follow-up    4 month F/U  . Diabetes    Checks BS daily every morning-120 Average  . Hypertension    Headaches and dizziness sometimes  . Hyperlipidemia  . Seizures  . Sleep Apnea    I connected with  [name]  on 01/29/20 at 10:20 AM EST by a video enabled telemedicine application and verified that I am speaking with the correct person using two identifiers.  I discussed the limitations of evaluation and management by telemedicine and the availability of in person appointments. The patient expressed understanding and agreed to proceed. Staff also discussed with the patient that there may be a patient responsible charge related to this service. Patient Location: at home Provider Location:  [facility]   HPI  DMII: A1C was down to 6.2% , 6.6%, today was 6.7%  still at goal and we will continue current regiment. He is on Lantus 25 units daily, metformin and Trajenta. Tolerating medications well.  Glucose at home has been 120's fasting.  He has been walking and lost some weight. Denies polyphagia, polydipsia or polyuria. On viagra for ED  Constipation: doing well on Amitiza and has follow up with GI, referral already placed he will contact Dr. Marius Ditch directly   HTN/Angina : taking medication, bp is usually at goal, he did not take medications this am, denies chest pain or palpitation at this time. BP at home has been 120's/80's. Had a recent visit with Dr. Fletcher Anon, bp goes up a little after activity but not elevated 129/87 or so   Hyperlipidemia: compliant with medication, reviewed last labs , LDL at goal it was 68   Seizure: no recent episodes, he gets dizzy occasionally  ,but has been able to drive short distances.  OSA: he is wearing CPAP at least 6 times a week,  but only for 3 hours.Doing  well   Morbid obesity: BMI above 35 wih multiple co-morbidities. He has increased physical activity, three times a week walking at the mall, splurged on food during holidays but is losing weight since last visit.   Patient Active Problem List   Diagnosis Date Noted  . Localized, primary osteoarthritis of hand 09/28/2019  . Seizure ([place]) 02/22/2018  . Mild cognitive impairment 11/17/2017  . Radiculopathy of cervical region 11/17/2017  . Stenosis of carotid artery 09/08/2017  . Cervical stenosis of spine 09/08/2017  . B12 deficiency 06/28/2017  . Headache disorder 02/15/2017  . Memory loss or impairment 02/15/2017  . Coronary artery disease involving native coronary artery of native heart   . Radiculitis of left cervical region 03/05/2016  . Diabetic neuropathy associated with type 2 diabetes mellitus ([place]) 12/05/2015  . Patellar subluxation 06/05/2015  . Allergic rhinitis, seasonal 06/05/2015  . Chronic constipation 06/05/2015  . Chronic lower back pain 06/05/2015  . Morbid obesity due to excess calories ([place]) 06/05/2015  . Vitamin D deficiency 06/05/2015  . Central sleep apnea 06/05/2015  . 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 Nerve root pain 06/05/2015  . Acquired polycythemia 06/05/2015  . Anterior knee pain 06/05/2015  . Dysmetabolic syndrome 92/44/6286  . Failure of erection 06/05/2015  . Gastro-esophageal reflux disease without esophagitis 06/05/2015  . Neuropathy 06/05/2015  . CAD (coronary artery disease)   . Angina, class III ([place]) 05/23/2015  .  Essential hypertension 05/23/2015  . Hyperlipidemia 05/23/2015  . Inguinal hernia without mention of obstruction or gangrene, recurrent unilateral or unspecified 03/24/2013    Past Surgical History:  Procedure Laterality Date  . BACK SURGERY  12/2008   X2-LUMBAR  . CARDIAC CATHETERIZATION N/A 05/30/2015   Procedure: Left Heart Cath;  Surgeon: Wellington Hampshire, MD;  Location: [place] [facility];  Service: Cardiovascular;   Laterality: N/A;  . CARDIAC CATHETERIZATION N/A 04/16/2016   Procedure: Left Heart Cath and Coronary Angiography;  Surgeon: Wellington Hampshire, MD;  Location: [place] [facility];  Service: Cardiovascular;  Laterality: N/A;  . COLONOSCOPY  2014   Dr. [name]  . COLONOSCOPY WITH PROPOFOL N/A 12/25/2016   Procedure: COLONOSCOPY WITH PROPOFOL;  Surgeon: Jonathon Bellows, MD;  Location: [facility] ENDOSCOPY;  Service: Endoscopy;  Laterality: N/A;  . COLONOSCOPY WITH PROPOFOL N/A 01/29/2017   Procedure: COLONOSCOPY WITH PROPOFOL;  Surgeon: Jonathon Bellows, MD;  Location: [facility] ENDOSCOPY;  Service: Endoscopy;  Laterality: N/A;  . ESOPHAGOGASTRODUODENOSCOPY (EGD) WITH PROPOFOL N/A 01/26/2019   Procedure: ESOPHAGOGASTRODUODENOSCOPY (EGD) WITH PROPOFOL;  Surgeon: Lin Landsman, MD;  Location: [facility] ENDOSCOPY;  Service: Gastroenterology;  Laterality: N/A;  . EVALUATION UNDER ANESTHESIA WITH HEMORRHOIDECTOMY N/A 02/24/2017   Procedure: EXAM UNDER ANESTHESIA WITH POSSIBLE EXCISION OF INTERNAL HEMORRHOIDS;  Surgeon: Olean Ree, MD;  Location: [facility] ORS;  Service: General;  Laterality: N/A;  . FISSURECTOMY  02/24/2017   Procedure: FISSURECTOMY;  Surgeon: Olean Ree, MD;  Location: [facility] ORS;  Service: General;;  . HAND SURGERY Left 2020  . HERNIA REPAIR Right 1991  . INGUINAL HERNIA REPAIR Right 2012   Dr [name]  . INGUINAL HERNIA REPAIR Right 2014   Dr [name]    Family History  Problem Relation Age of Onset  . Heart Problems Mother   . Heart Problems Father 1       myocardial infarction  . Mental illness Brother   . Testicular cancer Paternal Uncle   . Other Maternal Aunt        COVID  . Breast cancer Cousin     Social History   Socioeconomic History  . Marital status: Single    Spouse name: Not on file  . Number of children: Not on file  . Years of education: Not on file  . Highest education level: Not on file  Occupational History  . Not on file  Tobacco Use  . Smoking status: Former Smoker     Years: 2.00    Types: Cigars    Start date: 12/28/2002    Quit date: 06/04/2005    Years since quitting: 14.6  . Smokeless tobacco: Never Used  Substance and Sexual Activity  . Alcohol use: Yes    Alcohol/week: 1.0 standard drinks    Types: 1 Glasses of wine per week    Comment: RARE BEER  . Drug use: No  . Sexual activity: Yes    Partners: Female  Other Topics Concern  . Not on file  Social History Narrative  . Not on file   Social Determinants of Health   Financial Resource Strain: Low Risk   . Difficulty of Paying Living Expenses: Not hard at all  Food Insecurity: No Food Insecurity  . Worried About Charity fundraiser in the Last Year: Never true  . Ran Out of Food in the Last Year: Never true  Transportation Needs: No Transportation Needs  . Lack of Transportation (Medical): No  . Lack of Transportation (Non-Medical): No  Physical Activity:  Insufficiently Active  . Days of Exercise per Week: 3 days  . Minutes of Exercise per Session: 40 min  Stress: No Stress Concern Present  . Feeling of Stress : Not at all  Social Connections: Slightly Isolated  . Frequency of Communication with Friends and Family: More than three times a week  . Frequency of Social Gatherings with Friends and Family: More than three times a week  . Attends Religious Services: More than 4 times per year  . Active Member of Clubs or Organizations: Yes  . Attends Archivist Meetings: More than 4 times per year  . Marital Status: Divorced  Human resources officer Violence: Not At Risk  . Fear of Current or Ex-Partner: No  . Emotionally Abused: No  . Physically Abused: No  . Sexually Abused: No     Current Outpatient Medications:  .  AMITIZA 24 MCG capsule, TAKE 1 CAPSULE(24 MCG) BY MOUTH TWICE DAILY WITH A MEAL, Disp: 60 capsule, Rfl: 0 .  aspirin 81 MG tablet, Take 81 mg by mouth daily., Disp: , Rfl:  .  atorvastatin (LIPITOR) 80 MG tablet, Take 1 tablet (80 mg total) by mouth daily., Disp: 90  tablet, Rfl: 1 .  blood glucose meter kit and supplies KIT, Dispense based on patient and insurance preference. Use up to four times daily as directed. (FOR ICD-9 250.00, 250.01)., Disp: 1 each, Rfl: 0 .  carvedilol (COREG) 12.5 MG tablet, Take 1 tablet (12.5 mg total) by mouth 2 (two) times daily., Disp: 180 tablet, Rfl: 3 .  cetirizine (ZYRTEC) 10 MG tablet, Take 10 mg by mouth daily as needed for allergies. , Disp: , Rfl: 1 .  diclofenac sodium (VOLTAREN) 1 % GEL, Apply 4 g topically 2 (two) times daily as needed., Disp: 100 g, Rfl: 5 .  dicyclomine (BENTYL) 10 MG capsule, TAKE 1 CAPSULE BY MOUTH THREE TIMES A DAY BEFORE MEALS, Disp: 90 capsule, Rfl: 0 .  ezetimibe (ZETIA) 10 MG tablet, TAKE 1 TABLET(10 MG) BY MOUTH DAILY, Disp: 90 tablet, Rfl: 1 .  fluticasone (FLONASE) 50 MCG/ACT nasal spray, Place 2 sprays into the nose daily as needed for allergies or rhinitis. , Disp: , Rfl:  .  glucose blood test strip, TID and prn, Disp: 100 each, Rfl: 12 .  Insulin Pen Needle 32G X 4 MM MISC, 30 Units by Does not apply route daily., Disp: 90 each, Rfl: 1 .  LANTUS SOLOSTAR 100 UNIT/ML Solostar Pen, INJECT 28 UNITS INTO THE SKIN DAILY, Disp: 15 mL, Rfl: 0 .  losartan-hydrochlorothiazide (HYZAAR) 50-12.5 MG tablet, Take 1 tablet by mouth daily., Disp: 90 tablet, Rfl: 1 .  metFORMIN (GLUCOPHAGE) 500 MG tablet, TAKE 1 TABLET(500 MG) BY MOUTH TWICE DAILY WITH A MEAL, Disp: 180 tablet, Rfl: 0 .  NITROSTAT 0.4 MG SL tablet, place 1 tablet under the tongue every 5 minutes for UP TO 3 doses if needed for angina as directed by prescriber, Disp: 25 tablet, Rfl: 1 .  ranolazine (RANEXA) 1000 MG SR tablet, TAKE 1 TABLET(1000 MG) BY MOUTH TWICE DAILY, Disp: 180 tablet, Rfl: 3 .  selenium sulfide (SELSUN) 2.5 % shampoo, Apply 1 application topically daily as needed for irritation., Disp: 118 mL, Rfl: 12 .  sildenafil (VIAGRA) 100 MG tablet, Take 1 tablet (100 mg total) by mouth as needed for erectile dysfunction., Disp:  30 tablet, Rfl: 0 .  TRADJENTA 5 MG TABS tablet, TAKE 1 TABLET(5 MG) BY MOUTH DAILY, Disp: 90 tablet, Rfl: 1  Allergies  Allergen Reactions  . Gabapentin Other (See Comments)    Groggy-Mood Changes  . Latex Rash  . Penicillins Hives and Rash    Has patient had a PCN reaction causing immediate rash, facial/tongue/throat swelling, SOB or lightheadedness with hypotension: Yes Has patient had a PCN reaction causing severe rash involving mucus membranes or skin necrosis: No Has patient had a PCN reaction that required hospitalization No Has patient had a PCN reaction occurring within the last 10 years: No If all of the above answers are "NO", then may proceed with Cephalosporin use.     I personally reviewed active problem list, medication list, allergies, family history, social history with the patient/caregiver today.   ROS  Constitutional: Negative for fever , positive for  weight change.  Respiratory: Negative for cough and shortness of breath.   Cardiovascular: Negative for chest pain or palpitations.  Gastrointestinal: Negative for abdominal pain, no bowel changes.  Musculoskeletal: Negative for gait problem or joint swelling.  Skin: Negative for rash.  Neurological: Negative for dizziness or headache.  No other specific complaints in a complete review of systems (except as listed in HPI above).  Objective  Virtual encounter, vitals obtained at home  Today's Vitals   01/29/20 0915  BP: 125/83  Pulse: 63  Weight: 262 lb (118.8 kg)  Height: '5\' 11"'  (1.803 m)  PainSc: 5    Body mass index is 36.54 kg/m.   Physical Exam  Awake, alert and oriented   Results for orders placed or performed in visit on 01/29/20 (from the past 72 hour(s))  POCT HgB A1C     Status: Normal   Collection Time: 01/29/20 10:05 AM  Result Value Ref Range   Hemoglobin A1C     HbA1c POC (<> result, manual entry)     HbA1c, POC (prediabetic range)     HbA1c, POC (controlled diabetic range) 6.7  0.0 - 7.0 %    PHQ2/9: Depression screen [facility] 2/9 01/29/2020 09/28/2019 06/12/2019 12/23/2018 11/22/2018  Decreased Interest 0 0 0 0 0  Down, Depressed, Hopeless 0 0 0 0 0  PHQ - 2 Score 0 0 0 0 0  Altered sleeping 0 2 0 - -  Tired, decreased energy 0 3 0 - -  Change in appetite 0 1 0 - -  Feeling bad or failure about yourself  0 0 0 - -  Trouble concentrating 0 0 0 - -  Moving slowly or fidgety/restless 0 0 0 - -  Suicidal thoughts 0 0 0 - -  PHQ-9 Score 0 6 0 - -  Difficult doing work/chores Not difficult at all Somewhat difficult Not difficult at all - -  Some recent data might be hidden   PHQ-2/9 Result is negative.    Fall Risk: Fall Risk  01/29/2020 09/28/2019 06/12/2019 12/23/2018 11/22/2018  Falls in the past year? 0 0 1 0 0  Number falls in past yr: 0 0 1 0 0  Injury with Fall? 0 0 1 1 -  Comment - - Left Thumb - -  Follow up - - - - -     Assessment & Plan  1. Controlled type 2 diabetes mellitus with microalbuminuria, without long-term current use of insulin (HCC)  - POCT HgB A1C - Insulin Glargine (LANTUS SOLOSTAR) 100 UNIT/ML Solostar Pen; INJECT 28 UNITS INTO THE SKIN DAILY  Dispense: 15 mL; Refill: 0  2. Essential hypertension  At goal   3. Seizure ([place])  Doing well   4. Morbid obesity ([place])  Losing weight   5. Controlled type 2 diabetes with neuropathy (HCC)  - Insulin Glargine (LANTUS SOLOSTAR) 100 UNIT/ML Solostar Pen; INJECT 28 UNITS INTO THE SKIN DAILY  Dispense: 15 mL; Refill: 0  6. Coronary artery disease due to lipid rich plaque  Doing well on medications  7. Other male erectile dysfunction  - sildenafil (VIAGRA) 100 MG tablet; Take 1 tablet (100 mg total) by mouth as needed for erectile dysfunction.  Dispense: 30 tablet; Refill: 0  8. Coronary artery disease of native artery of native heart with stable angina pectoris ([place])   9. Mixed hyperlipidemia  - atorvastatin (LIPITOR) 80 MG tablet; Take 1 tablet (80 mg total) by mouth daily.   Dispense: 90 tablet; Refill: 1   I discussed the assessment and treatment plan with the patient. The patient was provided an opportunity to ask questions and all were answered. The patient agreed with the plan and demonstrated an understanding of the instructions.  The patient was advised to call back or seek an in-person evaluation if the symptoms worsen or if the condition fails to improve as anticipated.  I provided 25  minutes of non-face-to-face time during this encounter.

## 2020-01-30 ENCOUNTER — Other Ambulatory Visit: Payer: Self-pay | Admitting: Family Medicine

## 2020-01-30 ENCOUNTER — Encounter: Payer: Self-pay | Admitting: Family Medicine

## 2020-01-30 NOTE — Telephone Encounter (Signed)
Requested Prescriptions  Pending Prescriptions Disp Refills  . BD PEN NEEDLE NANO 2ND GEN 32G X 4 MM MISC [Pharmacy Med Name: B-D NANO 2ND GEN PEN NDL [id number]] 100 each     Sig: USE AS DIRECTED     Endocrinology: Diabetes - Testing Supplies Passed - 01/30/2020  1:19 PM      Passed - Valid encounter within last 12 months    Recent Outpatient Visits          Yesterday Controlled type 2 diabetes mellitus with microalbuminuria, without long-term current use of insulin [facility])   [facility], Drue Stager, MD   4 months ago Controlled type 2 diabetes mellitus with microalbuminuria, without long-term current use of insulin [facility])   [facility] [place], Drue Stager, MD   7 months ago Controlled type 2 diabetes mellitus with microalbuminuria, without long-term current use of insulin [facility])   [facility] [place], Drue Stager, MD   12 months ago Controlled type 2 diabetes mellitus with microalbuminuria, without long-term current use of insulin [facility])   [facility] Steele Sizer, MD   1 year ago Bright red rectal bleeding   [facility] Steele Sizer, MD      Future Appointments            In 4 months Ancil Boozer, Drue Stager, MD [facility], [place]   In 5 months Fletcher Anon, Mertie Clause, MD [facility]

## 2020-01-31 ENCOUNTER — Other Ambulatory Visit: Payer: Self-pay | Admitting: Orthopedic Surgery

## 2020-01-31 ENCOUNTER — Other Ambulatory Visit: Payer: Self-pay | Admitting: Family Medicine

## 2020-01-31 DIAGNOSIS — M5030 Other cervical disc degeneration, unspecified cervical region: Secondary | ICD-10-CM

## 2020-01-31 DIAGNOSIS — E1129 Type 2 diabetes mellitus with other diabetic kidney complication: Secondary | ICD-10-CM

## 2020-01-31 DIAGNOSIS — R809 Proteinuria, unspecified: Secondary | ICD-10-CM

## 2020-02-12 ENCOUNTER — Ambulatory Visit: Payer: Medicaid Other

## 2020-02-16 ENCOUNTER — Other Ambulatory Visit: Payer: Self-pay

## 2020-02-16 ENCOUNTER — Ambulatory Visit
Admission: RE | Admit: 2020-02-16 | Discharge: 2020-02-16 | Disposition: A | Discharge status: Home / Self Care | Payer: Medicaid Other | Source: Ambulatory Visit | Attending: Orthopedic Surgery | Admitting: Orthopedic Surgery

## 2020-02-16 DIAGNOSIS — M5030 Other cervical disc degeneration, unspecified cervical region: Secondary | ICD-10-CM | POA: Insufficient documentation

## 2020-02-16 DIAGNOSIS — M542 Cervicalgia: Secondary | ICD-10-CM | POA: Diagnosis not present

## 2020-02-25 ENCOUNTER — Other Ambulatory Visit: Payer: Self-pay | Admitting: Family Medicine

## 2020-03-15 ENCOUNTER — Ambulatory Visit: Payer: Medicaid Other | Attending: Internal Medicine

## 2020-03-15 DIAGNOSIS — Z23 Encounter for immunization: Secondary | ICD-10-CM

## 2020-03-15 NOTE — Progress Notes (Signed)
   Covid-19 Vaccination Clinic  Name:  [name] [name]    MRN: [id number] DOB: 1961-01-04  03/15/2020  [name] was observed post Covid-19 immunization for 15 minutes without incident. He was provided with Vaccine Information Sheet and instruction to access the V-Safe system.   [name] was instructed to call 911 with any severe reactions post vaccine: Marland Kitchen Difficulty breathing  . Swelling of face and throat  . A fast heartbeat  . A bad rash all over body  . Dizziness and weakness   Immunizations Administered    Name Date Dose VIS Date Route   Pfizer COVID-19 Vaccine 03/15/2020 10:31 AM 0.3 mL 12/08/2019 Intramuscular   Manufacturer: [place]   Lot: [id number]   [place]: [id number]

## 2020-03-19 ENCOUNTER — Other Ambulatory Visit: Payer: Self-pay

## 2020-03-19 ENCOUNTER — Encounter: Payer: Self-pay | Admitting: Gastroenterology

## 2020-03-19 ENCOUNTER — Telehealth: Payer: Self-pay | Admitting: Gastroenterology

## 2020-03-19 ENCOUNTER — Ambulatory Visit (INDEPENDENT_AMBULATORY_CARE_PROVIDER_SITE_OTHER): Payer: Medicaid Other | Admitting: Gastroenterology

## 2020-03-19 VITALS — BP 105/76 | HR 70 | Temp 97.7°F | Ht 71.0 in | Wt 269.0 lb

## 2020-03-19 DIAGNOSIS — K5909 Other constipation: Secondary | ICD-10-CM

## 2020-03-19 DIAGNOSIS — Z1211 Encounter for screening for malignant neoplasm of colon: Secondary | ICD-10-CM

## 2020-03-19 DIAGNOSIS — D126 Benign neoplasm of colon, unspecified: Secondary | ICD-10-CM | POA: Diagnosis not present

## 2020-03-19 MED ORDER — NA SULFATE-K SULFATE-MG SULF 17.5-3.13-1.6 GM/177ML PO SOLN: 354.0000 mL | Freq: Once | ORAL | 0 refills | Status: AC

## 2020-03-19 NOTE — Telephone Encounter (Signed)
Pt left vm he was seen today and has a question for a nurse

## 2020-03-19 NOTE — Progress Notes (Signed)
[name] Darby, MD [address]  [place]  [place], [place] [address]  Main: [phone]  Fax: [phone]    Gastroenterology Consultation  Referring Provider:     Steele Sizer, MD Primary Care Physician:  [name] Sizer, MD Primary Gastroenterologist:  Dr. Vicente [name] Reason for Consultation: Rectal bleeding  And dyspepsia        HPI:   [name] [name] is a 59 y.o. African-American male referred by Dr. Steele Sizer, MD  for consultation & management of rectal bleeding.  Patient was seen by Dr. Vicente [name] for rectal bleeding in the past.  And, he was found to have large hemorrhoids.  He subsequently underwent left lateral hemorrhoidectomy by Dr. Hampton [name] in 03/2017. Patient reports that he has been noticing recurrence of bright red bleeding per rectum, dripping and on wiping.  He reports alternating episodes of 1 week diarrhea followed by constipation for which he takes stool softeners.  He reports upper abdominal pain associated with significant abdominal bloating.  He generally has lower abdominal discomfort.  He used to drink alcohol now replaced with sodas and Gatorade in last 3 to 4 weeks.  He lost about 10 pounds in last 1 month which he is worried about.  He is accompanied by his wife today.  He does not smoke cigarettes.  His hemoglobin is normal. CT in 02/2018 with normal hepato-pancreatic or biliary pathology  Follow-up visit 02/01/2019 Patient was hospitalized to [name] last week due to DKA on the day of EGD.  EGD was canceled and admitted to the hospital.  After his DKA resolved, EGD was performed prior to discharge.  It was unremarkable including pathology.  His hemoglobin A1c was 13.3, started on insulin.  He reports feeling significantly better.  His blood sugars are under 200s.  He also reports minimal rectal bleeding and he thinks it is from passing hard stools.  He took Dulcolax in the hospital and he stools on a regular with minimal bleeding.  He would like to defer hemorrhoid  ligation today.  Follow-up visit 03/19/2020 Patient reports doing significantly well within last 1 year.  He reports that his blood sugars are in the 120s.  His only concern today is constipation and bloating.  He was taking MiraLAX once a day which helped and then stopped because he did not know it is available over-the-counter.  He is started on Amitiza 24 MCG twice daily which he thinks is not helpful.  He is also here to discuss about colonoscopy as he is due.  NSAIDs: None  Antiplts/Anticoagulants/Anti thrombotics: None  GI Procedures:  EGD 01/26/2019 - Normal duodenal bulb and second portion of the duodenum. - Normal stomach. Biopsied. - Esophagogastric landmarks identified. - Normal gastroesophageal junction and esophagus.  DIAGNOSIS:  A. STOMACH; COLD BIOPSY:  - ANTRAL MUCOSA WITH MILD NON-SPECIFIC CHRONIC AND FOCAL ACTIVE  GASTRITIS.  - UNREMARKABLE OXYNTIC MUCOSA.  - NEGATIVE FOR HELICOBACTER; IHC STAIN EXAMINED.  - NEGATIVE FOR INTESTINAL METAPLASIA, DYSPLASIA, AND MALIGNANCY.   Colonoscopy in 2017 and 2018 by Dr. Vicente [name] - Preparation of the colon was poor. - Stool in the sigmoid colon. - No specimens collected.  - Three 5 to 7 mm polyps in the transverse colon, removed with a cold snare. Resected and retrieved. - One 3 mm polyp in the ascending colon, removed with a cold biopsy forceps. Resected and retrieved. - Non-bleeding internal hemorrhoids. - The examination was otherwise normal on direct and retroflexion views.  DIAGNOSIS:  A. COLON POLYP, ASCENDING; COLD BIOPSY:  -  TUBULAR ADENOMA.  - NEGATIVE FOR HIGH-GRADE DYSPLASIA AND MALIGNANCY.   B. COLON POLYP 2, TRANSVERSE; COLD SNARE:  - TUBULAR ADENOMAS (2), NEGATIVE FOR HIGH-GRADE DYSPLASIA AND  MALIGNANCY.  - POLYPOID COLONIC MUCOSA WITH HEMORRHAGE OF THE LAMINA PROPRIA (1).    Past Medical History:  Diagnosis Date  . Allergy   . Angina, class III ([place])   . Chronic constipation   . Degeneration of  intervertebral disc of cervical region   . Diabetes mellitus without complication ([place])   . Diastolic dysfunction    a. 06/2017 Echo: EF 60-65%, no rwma, Gr1 DD.  Marland Kitchen Dyspnea   . GERD (gastroesophageal reflux disease)   . Hernia 1991   02/10/2012-RIH repair  . Hyperlipidemia   . Hypertension 2008  . Nerve root pain   . Neuropathy   . Non-obstructive CAD (coronary artery disease)    a. 05/30/2015 cath: LM nl, mLAD 50% (FFR 0.83), LCx minor irregs, RCA minor irregs, EF 55-65%-->Med Rx; b. 03/2016 Cath: LM nl LAD 29m D1/2/3 min irregs, LCX min irregs, OM1/2/3 nl, RCA min irregs, RPDA/RPAV/RPL1/RPL2 nl-->Med Rx.  . Obesity, unspecified 2012  . Personal history of tobacco use, presenting hazards to health 2012  . Polycythemia   . Rectal bleeding 02/11/2017  . Recurrent Right Inguinal Hernia Repair 02/09/2011, 04/07/2013   [facility] . Sleep apnea    a. On CPAP;  b. 06/2017 Echo: no PAH.  .Marland KitchenUmbilical hernia without mention of obstruction or gangrene 02/09/2011   [facility] . Vitamin D deficiency     Past Surgical History:  Procedure Laterality Date  . BACK SURGERY  12/2008   X2-LUMBAR  . CARDIAC CATHETERIZATION N/A 05/30/2015   Procedure: Left Heart Cath;  Surgeon: MWellington Hampshire MD;  [facility];  Service: Cardiovascular;  Laterality: N/A;  . CARDIAC CATHETERIZATION N/A 04/16/2016   Procedure: Left Heart Cath and Coronary Angiography;  Surgeon: MWellington Hampshire MD;  [facility];  Service: Cardiovascular;  Laterality: N/A;  . COLONOSCOPY  2014   Dr. SJamal Collin . COLONOSCOPY WITH PROPOFOL N/A 12/25/2016   Procedure: COLONOSCOPY WITH PROPOFOL;  Surgeon: KJonathon Bellows MD;  Location: [facility] ENDOSCOPY;  Service: Endoscopy;  Laterality: N/A;  . COLONOSCOPY WITH PROPOFOL N/A 01/29/2017   Procedure: COLONOSCOPY WITH PROPOFOL;  Surgeon: KJonathon Bellows MD;  Location: [facility] ENDOSCOPY;  Service: Endoscopy;  Laterality: N/A;  . ESOPHAGOGASTRODUODENOSCOPY (EGD) WITH  PROPOFOL N/A 01/26/2019   Procedure: ESOPHAGOGASTRODUODENOSCOPY (EGD) WITH PROPOFOL;  Surgeon: VLin Landsman MD;  [facility];  Service: Gastroenterology;  Laterality: N/A;  . EVALUATION UNDER ANESTHESIA WITH HEMORRHOIDECTOMY N/A 02/24/2017   Procedure: EXAM UNDER ANESTHESIA WITH POSSIBLE EXCISION OF INTERNAL HEMORRHOIDS;  Surgeon: JOlean Ree MD;  Location: [facility] ORS;  Service: General;  Laterality: N/A;  . FISSURECTOMY  02/24/2017   Procedure: FISSURECTOMY;  Surgeon: JOlean Ree MD;  Location: [facility] ORS;  Service: General;;  . HAND SURGERY Left 2020  . HERNIA REPAIR Right 1991  . INGUINAL HERNIA REPAIR Right 2012   Dr SJamal Collin . INGUINAL HERNIA REPAIR Right 2014   Dr SJamal Collin    Current Outpatient Medications:  .  ACCU-CHEK AVIVA PLUS test strip, USE AS DIRECTED TO TEST BLOOD SUGAR THREE TIMES DAILY AND AS NEEDED, Disp: 100 strip, Rfl: 3 .  AMITIZA 24 MCG capsule, TAKE 1 CAPSULE(24 MCG) BY MOUTH TWICE DAILY WITH A MEAL, Disp: 60 capsule, Rfl: 0 .  aspirin 81 MG tablet, Take 81  mg by mouth daily., Disp: , Rfl:  .  atorvastatin (LIPITOR) 80 MG tablet, Take 1 tablet (80 mg total) by mouth daily., Disp: 90 tablet, Rfl: 1 .  BD PEN NEEDLE NANO 2ND GEN 32G X 4 MM MISC, USE AS DIRECTED, Disp: 100 each, Rfl: 1 .  blood glucose meter kit and supplies KIT, Dispense based on patient and insurance preference. Use up to four times daily as directed. (FOR ICD-9 250.00, 250.01)., Disp: 1 each, Rfl: 0 .  carvedilol (COREG) 12.5 MG tablet, Take 1 tablet (12.5 mg total) by mouth 2 (two) times daily., Disp: 180 tablet, Rfl: 3 .  cetirizine (ZYRTEC) 10 MG tablet, Take 10 mg by mouth daily as needed for allergies. , Disp: , Rfl: 1 .  diclofenac sodium (VOLTAREN) 1 % GEL, Apply 4 g topically 2 (two) times daily as needed., Disp: 100 g, Rfl: 5 .  dicyclomine (BENTYL) 10 MG capsule, TAKE 1 CAPSULE BY MOUTH THREE TIMES A DAY BEFORE MEALS, Disp: 90 capsule, Rfl: 0 .  ezetimibe (ZETIA) 10 MG  tablet, TAKE 1 TABLET(10 MG) BY MOUTH DAILY, Disp: 90 tablet, Rfl: 1 .  fluticasone (FLONASE) 50 MCG/ACT nasal spray, Place 2 sprays into the nose daily as needed for allergies or rhinitis. , Disp: , Rfl:  .  Insulin Glargine (LANTUS SOLOSTAR) 100 UNIT/ML Solostar Pen, INJECT 28 UNITS INTO THE SKIN DAILY, Disp: 15 mL, Rfl: 0 .  losartan-hydrochlorothiazide (HYZAAR) 50-12.5 MG tablet, Take 1 tablet by mouth daily., Disp: 90 tablet, Rfl: 1 .  metFORMIN (GLUCOPHAGE) 500 MG tablet, TAKE 1 TABLET(500 MG) BY MOUTH TWICE DAILY WITH A MEAL, Disp: 180 tablet, Rfl: 0 .  NITROSTAT 0.4 MG SL tablet, place 1 tablet under the tongue every 5 minutes for UP TO 3 doses if needed for angina as directed by prescriber, Disp: 25 tablet, Rfl: 1 .  ranolazine (RANEXA) 1000 MG SR tablet, TAKE 1 TABLET(1000 MG) BY MOUTH TWICE DAILY, Disp: 180 tablet, Rfl: 3 .  selenium sulfide (SELSUN) 2.5 % shampoo, Apply 1 application topically daily as needed for irritation., Disp: 118 mL, Rfl: 12 .  sildenafil (VIAGRA) 100 MG tablet, Take 1 tablet (100 mg total) by mouth as needed for erectile dysfunction., Disp: 30 tablet, Rfl: 0 .  TRADJENTA 5 MG TABS tablet, TAKE 1 TABLET(5 MG) BY MOUTH DAILY, Disp: 90 tablet, Rfl: 1 .  Na Sulfate-K Sulfate-Mg Sulf 17.5-3.13-1.6 GM/177ML SOLN, Take 354 mLs by mouth once for 1 dose., Disp: 354 mL, Rfl: 0   Family History  Problem Relation Age of Onset  . Heart Problems Mother   . Heart Problems Father 84       myocardial infarction  . Mental illness Brother   . Testicular cancer Paternal Uncle   . Other Maternal Aunt        COVID  . Breast cancer Cousin      Social History   Tobacco Use  . Smoking status: Former Smoker    Years: 2.00    Types: Cigars    Start date: 12/28/2002    Quit date: 06/04/2005    Years since quitting: 14.8  . Smokeless tobacco: Never Used  Substance Use Topics  . Alcohol use: Yes    Alcohol/week: 1.0 standard drinks    Types: 1 Glasses of wine per week     Comment: RARE BEER  . Drug use: No    Allergies as of 03/19/2020 - Review Complete 03/19/2020  Allergen Reaction Noted  . Gabapentin Other (See Comments)  04/16/2015  . Latex Rash 06/20/2015  . Penicillins Hives and Rash 03/23/2013    Review of Systems:    All systems reviewed and negative except where noted in HPI.   Physical Exam:  BP 105/76 (BP Location: Left Arm, Patient Position: Sitting, Cuff Size: Normal)   Pulse 70   Temp 97.7 F (36.5 C) (Oral)   Ht '5\' 11"'  (1.803 m)   Wt 269 lb (122 kg)   BMI 37.52 kg/m  No LMP for male patient.  General:   Alert,  Well-developed, well-nourished, pleasant and cooperative in NAD Head:  Normocephalic and atraumatic. Eyes:  Sclera clear, no icterus.   Conjunctiva pink. Ears:  Normal auditory acuity. Nose:  No deformity, discharge, or lesions. Mouth:  No deformity or lesions,oropharynx pink & moist. Neck:  Supple; no masses or thyromegaly. Lungs:  Respirations even and unlabored.  Clear throughout to auscultation.   No wheezes, crackles, or rhonchi. No acute distress. Heart:  Regular rate and rhythm; no murmurs, clicks, rubs, or gallops. Abdomen:  Normal bowel sounds. Soft, non-tender and non-distended without masses, hepatosplenomegaly or hernias noted.  No guarding or rebound tenderness.   Rectal: Not performed Msk:  Symmetrical without gross deformities. Good, equal movement & strength bilaterally. Pulses:  Normal pulses noted. Extremities:  No clubbing or edema.  No cyanosis. Neurologic:  Alert and oriented x3;  grossly normal neurologically. Skin:  Intact without significant lesions or rashes. No jaundice. Psych:  Alert and cooperative. Normal mood and affect.  Imaging Studies: Reviewed  Assessment and Plan:   [name] [name] is a 59 y.o. African-American male with metabolic syndrome seen for follow-up of abdominal bloating, constipation.  EGD is unremarkable.  No evidence of H. pylori.  Dyspepsia: Minimal symptoms EGD  unremarkable  Constipation and abdominal bloating Maintain high-fiber diet Samples of Metamucil provided Recommend to restart MiraLAX 1-2 times daily Amitiza 24 MCG 2 times a day is not working, can be stopped Next option is trial of Linzess if MiraLAX does not work  Tubular adenomas of colon Recommend surveillance colonoscopy, scheduled   Follow up as needed   [name] Darby, MD

## 2020-03-19 NOTE — Patient Instructions (Signed)
Please get fiber choice over the counter

## 2020-03-19 NOTE — Telephone Encounter (Signed)
Patient was calling to make sure it was okay for him to get the last COVID vaccine and the Covid swab. Informed patient it was that there would be no interaction

## 2020-03-27 ENCOUNTER — Other Ambulatory Visit: Payer: Self-pay

## 2020-03-27 ENCOUNTER — Encounter: Payer: Self-pay | Admitting: Family Medicine

## 2020-03-27 MED ORDER — SELENIUM SULFIDE 2.5 % EX LOTN: 1.0000 "application " | TOPICAL_LOTION | Freq: Every day | CUTANEOUS | 12 refills | Status: DC | PRN

## 2020-04-04 ENCOUNTER — Encounter: Payer: Self-pay | Admitting: Family Medicine

## 2020-04-04 ENCOUNTER — Other Ambulatory Visit: Payer: Self-pay | Admitting: *Deleted

## 2020-04-04 ENCOUNTER — Other Ambulatory Visit: Payer: Self-pay | Admitting: Family Medicine

## 2020-04-04 DIAGNOSIS — E782 Mixed hyperlipidemia: Secondary | ICD-10-CM

## 2020-04-04 MED ORDER — EZETIMIBE 10 MG PO TABS: 10.0000 mg | ORAL_TABLET | Freq: Every day | ORAL | 1 refills | Status: DC

## 2020-04-04 MED ORDER — LOSARTAN POTASSIUM-HCTZ 50-12.5 MG PO TABS: 1.0000 | ORAL_TABLET | Freq: Every day | ORAL | 0 refills | Status: DC

## 2020-04-05 ENCOUNTER — Ambulatory Visit: Payer: Medicaid Other | Attending: Internal Medicine

## 2020-04-05 DIAGNOSIS — Z23 Encounter for immunization: Secondary | ICD-10-CM

## 2020-04-05 NOTE — Progress Notes (Signed)
   Covid-19 Vaccination Clinic  Name:  ARKEEM GIMA    MRN: [id number] DOB: 03-04-1961  04/05/2020  Mr. Wackerman was observed post Covid-19 immunization for 15 minutes without incident. He was provided with Vaccine Information Sheet and instruction to access the V-Safe system.   Mr. Ocon was instructed to call 911 with any severe reactions post vaccine: Marland Kitchen Difficulty breathing  . Swelling of face and throat  . A fast heartbeat  . A bad rash all over body  . Dizziness and weakness   Immunizations Administered    Name Date Dose VIS Date Route   Pfizer COVID-19 Vaccine 04/05/2020 10:30 AM 0.3 mL 12/08/2019 Intramuscular   Manufacturer: [employer], [employer]   Lot: [id number]   [place]: [id number]

## 2020-04-09 ENCOUNTER — Ambulatory Visit: Payer: Medicaid Other

## 2020-04-09 ENCOUNTER — Telehealth: Payer: Self-pay

## 2020-04-09 ENCOUNTER — Other Ambulatory Visit
Admission: RE | Admit: 2020-04-09 | Discharge: 2020-04-09 | Disposition: A | Discharge status: Home / Self Care | Payer: Medicaid Other | Source: Ambulatory Visit | Attending: Gastroenterology | Admitting: Gastroenterology

## 2020-04-09 ENCOUNTER — Other Ambulatory Visit: Payer: Self-pay

## 2020-04-09 VITALS — BP 118/74 | HR 60

## 2020-04-09 DIAGNOSIS — Z01812 Encounter for preprocedural laboratory examination: Secondary | ICD-10-CM | POA: Insufficient documentation

## 2020-04-09 DIAGNOSIS — Z20822 Contact with and (suspected) exposure to covid-19: Secondary | ICD-10-CM | POA: Diagnosis not present

## 2020-04-09 DIAGNOSIS — I10 Essential (primary) hypertension: Secondary | ICD-10-CM

## 2020-04-09 LAB — SARS CORONAVIRUS 2 (TAT 6-24 HRS): SARS Coronavirus 2: NEGATIVE

## 2020-04-09 NOTE — Telephone Encounter (Signed)
Patient is a patient of Dr.Vanga's, scheduled for his colonoscopy on 04/11/20.  Dr. Marius Ditch is scheduled for [facility] on 15th.  She does not have any room to move the location.  Patient has already had COVID Test was done this morning.  He has plans to go out of town, so would not be able to reschedule for next week.  I've asked him if he is okay with another doctor doing the procedure.  He is definitely okay with this. Dr. Marius Ditch ordered a screening colonoscopy on him.  I would like to make sure that both you and Dr. Vicente Males are okay with this.  Thanks,  [place], [place]

## 2020-04-09 NOTE — Telephone Encounter (Signed)
Yes, I'm ok with it  RV

## 2020-04-09 NOTE — Telephone Encounter (Signed)
Iwill do at [facility] correct ?

## 2020-04-09 NOTE — Progress Notes (Signed)
Patient is here for a blood pressure check. Patient denies chest pain, palpitations, shortness of breath or visual disturbances. At previous visit blood pressure was 125/83 with a heart rate of 63. Today during nurse visit first check blood pressure was 118/74.  He does take any blood pressure medications.

## 2020-04-10 ENCOUNTER — Encounter: Payer: Self-pay | Admitting: Gastroenterology

## 2020-04-11 ENCOUNTER — Ambulatory Visit
Admission: RE | Admit: 2020-04-11 | Discharge: 2020-04-11 | Disposition: A | Discharge status: Home / Self Care | Payer: Medicaid Other | Attending: Gastroenterology | Admitting: Gastroenterology

## 2020-04-11 ENCOUNTER — Ambulatory Visit: Payer: Medicaid Other | Admitting: Certified Registered"

## 2020-04-11 ENCOUNTER — Encounter
Admission: RE | Disposition: A | Discharge status: Home / Self Care | Payer: Self-pay | Source: Home / Self Care | Attending: Gastroenterology

## 2020-04-11 ENCOUNTER — Encounter: Payer: Self-pay | Admitting: Gastroenterology

## 2020-04-11 ENCOUNTER — Other Ambulatory Visit: Payer: Medicaid Other

## 2020-04-11 DIAGNOSIS — Z87891 Personal history of nicotine dependence: Secondary | ICD-10-CM | POA: Diagnosis not present

## 2020-04-11 DIAGNOSIS — M1990 Unspecified osteoarthritis, unspecified site: Secondary | ICD-10-CM | POA: Diagnosis not present

## 2020-04-11 DIAGNOSIS — Z1211 Encounter for screening for malignant neoplasm of colon: Secondary | ICD-10-CM

## 2020-04-11 DIAGNOSIS — Z7984 Long term (current) use of oral hypoglycemic drugs: Secondary | ICD-10-CM | POA: Diagnosis not present

## 2020-04-11 DIAGNOSIS — I10 Essential (primary) hypertension: Secondary | ICD-10-CM | POA: Diagnosis not present

## 2020-04-11 DIAGNOSIS — E119 Type 2 diabetes mellitus without complications: Secondary | ICD-10-CM | POA: Insufficient documentation

## 2020-04-11 DIAGNOSIS — Z79899 Other long term (current) drug therapy: Secondary | ICD-10-CM | POA: Diagnosis not present

## 2020-04-11 DIAGNOSIS — G4730 Sleep apnea, unspecified: Secondary | ICD-10-CM | POA: Diagnosis not present

## 2020-04-11 DIAGNOSIS — K219 Gastro-esophageal reflux disease without esophagitis: Secondary | ICD-10-CM | POA: Insufficient documentation

## 2020-04-11 DIAGNOSIS — E785 Hyperlipidemia, unspecified: Secondary | ICD-10-CM | POA: Diagnosis not present

## 2020-04-11 DIAGNOSIS — I25119 Atherosclerotic heart disease of native coronary artery with unspecified angina pectoris: Secondary | ICD-10-CM | POA: Diagnosis not present

## 2020-04-11 DIAGNOSIS — Z7982 Long term (current) use of aspirin: Secondary | ICD-10-CM | POA: Insufficient documentation

## 2020-04-11 DIAGNOSIS — I2510 Atherosclerotic heart disease of native coronary artery without angina pectoris: Secondary | ICD-10-CM | POA: Insufficient documentation

## 2020-04-11 DIAGNOSIS — E669 Obesity, unspecified: Secondary | ICD-10-CM | POA: Diagnosis not present

## 2020-04-11 DIAGNOSIS — K5900 Constipation, unspecified: Secondary | ICD-10-CM

## 2020-04-11 HISTORY — PX: COLONOSCOPY WITH PROPOFOL: SHX5780

## 2020-04-11 LAB — GLUCOSE, CAPILLARY: Glucose-Capillary: 114 mg/dL — ABNORMAL HIGH (ref 70–99)

## 2020-04-11 SURGERY — COLONOSCOPY WITH PROPOFOL
Anesthesia: General

## 2020-04-11 MED ORDER — PROPOFOL 500 MG/50ML IV EMUL
INTRAVENOUS | Status: DC | PRN
  Administered 2020-04-11: 155 ug/kg/min via INTRAVENOUS

## 2020-04-11 MED ORDER — GLYCOPYRROLATE 0.2 MG/ML IJ SOLN
INTRAMUSCULAR | Status: DC | PRN
  Administered 2020-04-11: .2 mg via INTRAVENOUS

## 2020-04-11 MED ORDER — PROPOFOL 10 MG/ML IV BOLUS
INTRAVENOUS | Status: DC | PRN
  Administered 2020-04-11: 50 mg via INTRAVENOUS

## 2020-04-11 MED ORDER — SODIUM CHLORIDE 0.9 % IV SOLN
INTRAVENOUS | Status: DC
  Administered 2020-04-11: 1000 mL via INTRAVENOUS

## 2020-04-11 MED ORDER — LIDOCAINE HCL (CARDIAC) PF 100 MG/5ML IV SOSY
PREFILLED_SYRINGE | INTRAVENOUS | Status: DC | PRN
  Administered 2020-04-11: 100 mg via INTRATRACHEAL

## 2020-04-11 NOTE — Anesthesia Postprocedure Evaluation (Signed)
Anesthesia Post Note  Patient: [name] [name]  Procedure(s) Performed: COLONOSCOPY WITH PROPOFOL (N/A )  Patient location during evaluation: Endoscopy Anesthesia Type: General Level of consciousness: awake and alert and oriented Pain management: pain level controlled Vital Signs Assessment: post-procedure vital signs reviewed and stable Respiratory status: spontaneous breathing Cardiovascular status: blood pressure returned to baseline Anesthetic complications: no     Last Vitals:  Vitals:   04/11/20 1110 04/11/20 1120  BP: 125/70 125/72  Pulse: 76 71  Resp: 16 13  Temp:    SpO2: 98% 97%    Last Pain:  Vitals:   04/11/20 1050  TempSrc: Temporal  PainSc:                  [name]

## 2020-04-11 NOTE — Op Note (Signed)
[facility]astroenter[facility] Patient Name: [name] [name] Procedure Date: 04/11/2020 10:23 AM MRN: [id number] Account #: [id number] Date of Birth: 11-12-1961 Admit Type: Outpatient Age: 59 Room: [facility] ENDO ROOM 1 Gender: Male Note Status: Finalized Procedure:             Colonoscopy Indications:           Constipation Providers:             Jonathon Bellows MD, MD Medicines:             Monitored Anesthesia Care Complications:         No immediate complications. Procedure:             Pre-Anesthesia Assessment:                        - Prior to the procedure, a History and Physical was                         performed, and patient medications, allergies and                         sensitivities were reviewed. The patient's tolerance                         of previous anesthesia was reviewed.                        - The risks and benefits of the procedure and the                         sedation options and risks were discussed with the                         patient. All questions were answered and informed                         consent was obtained.                        - ASA Grade Assessment: II - A patient with mild                         systemic disease.                        After obtaining informed consent, the colonoscope was                         passed under direct vision. Throughout the procedure,                         the patient's blood pressure, pulse, and oxygen                         saturations were monitored continuously. The                         Colonoscope was introduced through the anus and  advanced to the the cecum, identified by the                         appendiceal orifice. The colonoscopy was performed                         with ease. The patient tolerated the procedure well.                         The quality of the bowel preparation was good. Findings:      The perianal and digital rectal  examinations were normal.      The entire examined colon appeared normal on direct and retroflexion       views. Impression:            - The entire examined colon is normal on direct and                         retroflexion views.                        - No specimens collected. Recommendation:        - Discharge patient to home (with escort).                        - Resume previous diet.                        - Continue present medications.                        - Repeat colonoscopy in 5 years for surveillance. Procedure Code(s):     --- Professional ---                        [phone], Colonoscopy, flexible; diagnostic, including                         collection of specimen(s) by brushing or washing, when                         performed (separate procedure) Diagnosis Code(s):     --- Professional ---                        K59.00, Constipation, unspecified CPT copyright 2019 American Medical Association. All rights reserved. The codes documented in this report are preliminary and upon coder review may  be revised to meet current compliance requirements. Jonathon Bellows, MD Jonathon Bellows MD, MD 04/11/2020 10:51:06 AM This report has been signed electronically. Number of Addenda: 0 Note Initiated On: 04/11/2020 10:23 AM Scope Withdrawal Time: 0 hours 9 minutes 25 seconds  Total Procedure Duration: 0 hours 11 minutes 15 seconds  Estimated Blood Loss:  Estimated blood loss: none.      [facility]

## 2020-04-11 NOTE — Transfer of Care (Signed)
Immediate Anesthesia Transfer of Care Note  Patient: [name] [name]  Procedure(s) Performed: COLONOSCOPY WITH PROPOFOL (N/A )  Patient Location: Endoscopy Unit  Anesthesia Type:General  Level of Consciousness: awake and patient cooperative  Airway & Oxygen Therapy: Patient Spontanous Breathing and Patient connected to face mask oxygen  Post-op Assessment: Report given to RN and Post -op Vital signs reviewed and stable  Post vital signs: Reviewed and stable  Last Vitals:  Vitals Value Taken Time  BP 114/73 04/11/20 1055  Temp 35.8 C 04/11/20 1050  Pulse 93 04/11/20 1058  Resp 18 04/11/20 1058  SpO2 96 % 04/11/20 1058  Vitals shown include unvalidated device data.  Last Pain:  Vitals:   04/11/20 1050  TempSrc: Temporal  PainSc:          Complications: No apparent anesthesia complications

## 2020-04-11 NOTE — Anesthesia Preprocedure Evaluation (Signed)
Anesthesia Evaluation  Patient identified by MRN, date of birth, ID band Patient awake    Reviewed: Allergy & Precautions, NPO status , Patient's Chart, lab work & pertinent test results  History of Anesthesia Complications Negative for: history of anesthetic complications  Airway Mallampati: II  TM Distance: >3 FB Neck ROM: Full    Dental  (+) Implants   Pulmonary shortness of breath and with exertion, sleep apnea , neg COPD, former smoker,    breath sounds clear to auscultation- rhonchi (-) wheezing      Cardiovascular hypertension, Pt. on medications + angina (last used NTG several months ago) + CAD (nonocclusive on last cath)   Rhythm:Regular Rate:Normal - Systolic murmurs and - Diastolic murmurs    Neuro/Psych  Headaches,  Neuromuscular disease negative psych ROS   GI/Hepatic Neg liver ROS, GERD  ,  Endo/Other  diabetes, Oral Hypoglycemic Agents  Renal/GU negative Renal ROS     Musculoskeletal  (+) Arthritis ,   Abdominal (+) + obese,   Peds  Hematology negative hematology ROS (+)   Anesthesia Other Findings Past Medical History: No date: Allergy No date: CAD (coronary artery disease)     Comment: a. cardiac cath 05/30/2015: LM nl, mLAD 50%, LCx              minor irregs, RCA minor irregs, EF 55-65%, no               WMA, no MR/AS, nl LVEDP, recommend aggressive               med Rx No date: Degeneration of intervertebral disc of cervica* No date: Diabetes mellitus without complication ([place]) [id number]: Hernia     Comment: 02/10/2012-RIH repair No date: Hyperlipidemia 2008: Hypertension 2012: Inguinal hernia without mention of obstruction* 2012: Obesity, unspecified 2012: Personal history of tobacco use, presenting ha* No date: Sleep apnea 0000000: Umbilical hernia without mention of obstructio*   Reproductive/Obstetrics                             Anesthesia Physical  Anesthesia  Plan  ASA: III  Anesthesia Plan: General   Post-op Pain Management:    Induction: Intravenous  PONV Risk Score and Plan: 2 and Propofol infusion and TIVA  Airway Management Planned: Natural Airway and Nasal Cannula  Additional Equipment:   Intra-op Plan:   Post-operative Plan:   Informed Consent: I have reviewed the patients History and Physical, chart, labs and discussed the procedure including the risks, benefits and alternatives for the proposed anesthesia with the patient or authorized representative who has indicated his/her understanding and acceptance.     Dental advisory given  Plan Discussed with: CRNA and Anesthesiologist  Anesthesia Plan Comments:         Anesthesia Quick Evaluation

## 2020-04-11 NOTE — H&P (Signed)
Jonathon Bellows, MD [address] [place], [place], [place], [address] [address], [place], [place], [place], [address] Phone: [phone]  Fax: [phone]  Primary Care Physician:  Steele Sizer, MD   Pre-Procedure History & Physical: HPI:  [name] [name] is a 59 y.o. male is here for an colonoscopy.   Past Medical History:  Diagnosis Date  . Allergy   . Angina, class III ([place])   . Chronic constipation   . Degeneration of intervertebral disc of cervical region   . Diabetes mellitus without complication ([place])   . Diastolic dysfunction    a. 06/2017 Echo: EF 60-65%, no rwma, Gr1 DD.  Marland Kitchen Dyspnea   . GERD (gastroesophageal reflux disease)   . Hernia 1991   02/10/2012-RIH repair  . Hyperlipidemia   . Hypertension 2008  . Nerve root pain   . Neuropathy   . Non-obstructive CAD (coronary artery disease)    a. 05/30/2015 cath: LM nl, mLAD 50% (FFR 0.83), LCx minor irregs, RCA minor irregs, EF 55-65%-->Med Rx; b. 03/2016 Cath: LM nl LAD 52m D1/2/3 min irregs, LCX min irregs, OM1/2/3 nl, RCA min irregs, RPDA/RPAV/RPL1/RPL2 nl-->Med Rx.  . Obesity, unspecified 2012  . Personal history of tobacco use, presenting hazards to health 2012  . Polycythemia   . Rectal bleeding 02/11/2017  . Recurrent Right Inguinal Hernia Repair 02/09/2011, 04/07/2013   Dr [facility] . Sleep apnea    a. On CPAP;  b. 06/2017 Echo: no PAH.  .Marland KitchenUmbilical hernia without mention of obstruction or gangrene 02/09/2011   Dr [facility] . Vitamin D deficiency     Past Surgical History:  Procedure Laterality Date  . BACK SURGERY  12/2008   X2-LUMBAR  . CARDIAC CATHETERIZATION N/A 05/30/2015   Procedure: Left Heart Cath;  Surgeon: MWellington Hampshire MD;  [facility];  Service: Cardiovascular;  Laterality: N/A;  . CARDIAC CATHETERIZATION N/A 04/16/2016   Procedure: Left Heart Cath and Coronary Angiography;  Surgeon: MWellington Hampshire MD;  [facility];  Service:  Cardiovascular;  Laterality: N/A;  . COLONOSCOPY  2014   Dr. SJamal Collin . COLONOSCOPY WITH PROPOFOL N/A 12/25/2016   Procedure: COLONOSCOPY WITH PROPOFOL;  Surgeon: KJonathon Bellows MD;  Location: [facility] ENDOSCOPY;  Service: Endoscopy;  Laterality: N/A;  . COLONOSCOPY WITH PROPOFOL N/A 01/29/2017   Procedure: COLONOSCOPY WITH PROPOFOL;  Surgeon: KJonathon Bellows MD;  Location: [facility] ENDOSCOPY;  Service: Endoscopy;  Laterality: N/A;  . ESOPHAGOGASTRODUODENOSCOPY (EGD) WITH PROPOFOL N/A 01/26/2019   Procedure: ESOPHAGOGASTRODUODENOSCOPY (EGD) WITH PROPOFOL;  Surgeon: VLin Landsman MD;  [facility];  Service: Gastroenterology;  Laterality: N/A;  . EVALUATION UNDER ANESTHESIA WITH HEMORRHOIDECTOMY N/A 02/24/2017   Procedure: EXAM UNDER ANESTHESIA WITH POSSIBLE EXCISION OF INTERNAL HEMORRHOIDS;  Surgeon: JOlean Ree MD;  Location: [facility] ORS;  Service: General;  Laterality: N/A;  . FISSURECTOMY  02/24/2017   Procedure: FISSURECTOMY;  Surgeon: JOlean Ree MD;  Location: [facility] ORS;  Service: General;;  . HAND SURGERY Left 2020  . HERNIA REPAIR Right 1991  . INGUINAL HERNIA REPAIR Right 2012   Dr SJamal Collin . INGUINAL HERNIA REPAIR Right 2014   Dr SJamal Collin   Prior to Admission medications   Medication Sig Start Date End Date Taking? Authorizing Provider  ACCU-CHEK AVIVA PLUS test strip USE AS DIRECTED TO TEST BLOOD SUGAR THREE TIMES DAILY AND AS NEEDED 02/25/20  Yes Sowles, KDrue Stager MD  AMITIZA 24 MCG capsule TAKE 1 CAPSULE(24  MCG) BY MOUTH TWICE DAILY WITH A MEAL 11/24/19  Yes Steele Sizer, MD  aspirin 81 MG tablet Take 81 mg by mouth daily.   Yes [provider]  atorvastatin (LIPITOR) 80 MG tablet Take 1 tablet (80 mg total) by mouth daily. 01/29/20  Yes Sowles, Drue Stager, MD  BD PEN NEEDLE NANO 2ND GEN 32G X 4 MM MISC USE AS DIRECTED 01/30/20  Yes Ancil Boozer, Drue Stager, MD  blood glucose meter kit and supplies KIT Dispense based on patient and insurance preference. Use up to four times daily as  directed. (FOR ICD-9 250.00, 250.01). 01/27/19  Yes Gouru, Aruna, MD  carvedilol (COREG) 12.5 MG tablet Take 1 tablet (12.5 mg total) by mouth 2 (two) times daily. 10/19/19  Yes Wellington Hampshire, MD  cetirizine (ZYRTEC) 10 MG tablet Take 10 mg by mouth daily as needed for allergies.  07/09/16  Yes [provider]  diclofenac sodium (VOLTAREN) 1 % GEL Apply 4 g topically 2 (two) times daily as needed. 12/30/18  Yes Sowles, Drue Stager, MD  Insulin Glargine (LANTUS SOLOSTAR) 100 UNIT/ML Solostar Pen INJECT 28 UNITS INTO THE SKIN DAILY 01/29/20  Yes Sowles, Drue Stager, MD  losartan-hydrochlorothiazide (HYZAAR) 50-12.5 MG tablet Take 1 tablet by mouth daily. 04/04/20  Yes Wellington Hampshire, MD  metFORMIN (GLUCOPHAGE) 500 MG tablet TAKE 1 TABLET(500 MG) BY MOUTH TWICE DAILY WITH A MEAL 01/20/20  Yes Sowles, Drue Stager, MD  ranolazine (RANEXA) 1000 MG SR tablet TAKE 1 TABLET(1000 MG) BY MOUTH TWICE DAILY 10/19/19  Yes Wellington Hampshire, MD  selenium sulfide (SELSUN) 2.5 % shampoo Apply 1 application topically daily as needed for irritation. 03/27/20  Yes Sowles, Drue Stager, MD  TRADJENTA 5 MG TABS tablet TAKE 1 TABLET(5 MG) BY MOUTH DAILY 01/20/20  Yes Sowles, Drue Stager, MD  dicyclomine (BENTYL) 10 MG capsule TAKE 1 CAPSULE BY MOUTH THREE TIMES A DAY BEFORE MEALS Patient not taking: Reported on 04/11/2020 04/27/18   Jonathon Bellows, MD  ezetimibe (ZETIA) 10 MG tablet Take 1 tablet (10 mg total) by mouth daily. Patient not taking: Reported on 04/11/2020 04/04/20   Steele Sizer, MD  fluticasone [facility]) 50 MCG/ACT nasal spray Place 2 sprays into the nose daily as needed for allergies or rhinitis.  03/05/15   [provider]  NITROSTAT 0.4 MG SL tablet place 1 tablet under the tongue every 5 minutes for UP TO 3 doses if needed for angina as directed by prescriber 01/15/17   Wellington Hampshire, MD  sildenafil (VIAGRA) 100 MG tablet Take 1 tablet (100 mg total) by mouth as needed for erectile dysfunction. 01/29/20   Steele Sizer, MD    Allergies as of 03/19/2020 - Review Complete 03/19/2020  Allergen Reaction Noted  . Gabapentin Other (See Comments) 04/16/2015  . Latex Rash 06/20/2015  . Penicillins Hives and Rash 03/23/2013    Family History  Problem Relation Age of Onset  . Heart Problems Mother   . Heart Problems Father 50       myocardial infarction  . Mental illness Brother   . Testicular cancer Paternal Uncle   . Other Maternal Aunt        COVID  . Breast cancer Cousin     Social History   Socioeconomic History  . Marital status: Single    Spouse name: Not on file  . Number of children: Not on file  . Years of education: Not on file  . Highest education level: Not on file  Occupational History  . Not on file  Tobacco Use  . Smoking status: Former Smoker    Years: 2.00    Types: Cigars    Start date: 12/28/2002    Quit date: 06/04/2005    Years since quitting: 14.8  . Smokeless tobacco: Never Used  Substance and Sexual Activity  . Alcohol use: Yes    Alcohol/week: 1.0 standard drinks    Types: 1 Glasses of wine per week    Comment: RARE BEER  . Drug use: No  . Sexual activity: Yes    Partners: Female  Other Topics Concern  . Not on file  Social History Narrative  . Not on file   Social Determinants of Health   Financial Resource Strain: Low Risk   . Difficulty of Paying Living Expenses: Not hard at all  Food Insecurity: No Food Insecurity  . Worried About Charity fundraiser in the Last Year: Never true  . Ran Out of Food in the Last Year: Never true  Transportation Needs: No Transportation Needs  . Lack of Transportation (Medical): No  . Lack of Transportation (Non-Medical): No  Physical Activity: Insufficiently Active  . Days of Exercise per Week: 3 days  . Minutes of Exercise per Session: 40 min  Stress: No Stress Concern Present  . Feeling of Stress : Not at all  Social Connections: Slightly Isolated  . Frequency of Communication with Friends and Family: More  than three times a week  . Frequency of Social Gatherings with Friends and Family: More than three times a week  . Attends Religious Services: More than 4 times per year  . Active Member of Clubs or Organizations: Yes  . Attends Archivist Meetings: More than 4 times per year  . Marital Status: Divorced  Human resources officer Violence: Not At Risk  . Fear of Current or Ex-Partner: No  . Emotionally Abused: No  . Physically Abused: No  . Sexually Abused: No    Review of Systems: See HPI, otherwise negative ROS  Physical Exam: BP (!) 164/103   Pulse 65   Temp (!) 96.5 F (35.8 C) (Temporal)   Resp 20   Ht '5\' 11"'  (1.803 m)   Wt 118.2 kg   SpO2 97%   BMI 36.34 kg/m  General:   Alert,  pleasant and cooperative in NAD Head:  Normocephalic and atraumatic. Neck:  Supple; no masses or thyromegaly. Lungs:  Clear throughout to auscultation, normal respiratory effort.    Heart:  +S1, +S2, Regular rate and rhythm, No edema. Abdomen:  Soft, nontender and nondistended. Normal bowel sounds, without guarding, and without rebound.   Neurologic:  Alert and  oriented x4;  grossly normal neurologically.  Impression/Plan: [name] [name] is here for an colonoscopy to be performed for Screening colonoscopy for constuipation Risks, benefits, limitations, and alternatives regarding  colonoscopy have been reviewed with the patient.  Questions have been answered.  All parties agreeable.   Jonathon Bellows, MD  04/11/2020, 10:25 AM

## 2020-04-12 ENCOUNTER — Encounter: Payer: Self-pay | Admitting: *Deleted

## 2020-04-18 DIAGNOSIS — E119 Type 2 diabetes mellitus without complications: Secondary | ICD-10-CM | POA: Diagnosis not present

## 2020-04-18 LAB — HM DIABETES EYE EXAM

## 2020-05-06 ENCOUNTER — Other Ambulatory Visit: Payer: Self-pay | Admitting: Family Medicine

## 2020-05-06 DIAGNOSIS — E782 Mixed hyperlipidemia: Secondary | ICD-10-CM

## 2020-05-06 DIAGNOSIS — K5903 Drug induced constipation: Secondary | ICD-10-CM

## 2020-05-06 NOTE — Telephone Encounter (Signed)
Requested Prescriptions  Pending Prescriptions Disp Refills  . lubiprostone (AMITIZA) 24 MCG capsule [Pharmacy Med Name: LUBIPROSTONE 24MCG CAPSULES] 60 capsule 0    Sig: TAKE 1 CAPSULE(24 MCG) BY MOUTH TWICE DAILY WITH A MEAL     Gastroenterology: Irritable Bowel Syndrome Passed - 05/06/2020 10:17 AM      Passed - Valid encounter within last 12 months    Recent Outpatient Visits          3 months ago Controlled type 2 diabetes mellitus with microalbuminuria, without long-term current use of insulin [facility])   [facility] [place], Drue Stager, MD   7 months ago Controlled type 2 diabetes mellitus with microalbuminuria, without long-term current use of insulin [facility])   [facility] [place], Drue Stager, MD   10 months ago Controlled type 2 diabetes mellitus with microalbuminuria, without long-term current use of insulin [facility])   [facility] [place], Drue Stager, MD   1 year ago Controlled type 2 diabetes mellitus with microalbuminuria, without long-term current use of insulin [facility])   [facility] Steele Sizer, MD   1 year ago Bright red rectal bleeding   [facility] Steele Sizer, MD      Future Appointments            In 1 month Steele Sizer, MD [facility], [place]   In 1 month Vanga, Tally Due, MD [place]   In 2 months Arida, Mertie Clause, MD [place]           . ezetimibe (ZETIA) 10 MG tablet [Pharmacy Med Name: EZETIMIBE 10MG  TABLETS] 90 tablet 1    Sig: TAKE 1 TABLET(10 MG) BY MOUTH DAILY     Cardiovascular:  Antilipid - Sterol Transport Inhibitors Passed - 05/06/2020 10:17 AM      Passed - Total Cholesterol in normal range and within 360 days    Cholesterol, Total  Date Value Ref Range Status  05/08/2016 167 100 - 199 mg/dL Final   Cholesterol  Date Value Ref Range Status  09/28/2019 133 <200 mg/dL Final         Passed  - LDL in normal range and within 360 days    LDL Cholesterol (Calc)  Date Value Ref Range Status  09/28/2019 68 mg/dL (calc) Final    Comment:    Reference range: <100 . Desirable range <100 mg/dL for primary prevention;   <70 mg/dL for patients with CHD or diabetic patients  with > or = 2 CHD risk factors. Marland Kitchen LDL-C is now calculated using the Martin-Hopkins  calculation, which is a validated novel method providing  better accuracy than the Friedewald equation in the  estimation of LDL-C.  Cresenciano Genre et al. Annamaria Helling. [id number]): 2061-2068  (http://education.[employer].com/faq/FAQ164)          Passed - HDL in normal range and within 360 days    HDL  Date Value Ref Range Status  09/28/2019 42 > OR = 40 mg/dL Final  05/08/2016 52 >39 mg/dL Final         Passed - Triglycerides in normal range and within 360 days    Triglycerides  Date Value Ref Range Status  09/28/2019 146 <150 mg/dL Final         Passed - Valid encounter within last 12 months    Recent Outpatient Visits          3 months ago Controlled type 2 diabetes mellitus with microalbuminuria, without long-term current  use of insulin [facility])   [facility] [place], Drue Stager, MD   7 months ago Controlled type 2 diabetes mellitus with microalbuminuria, without long-term current use of insulin [facility])   [facility] [place], Drue Stager, MD   10 months ago Controlled type 2 diabetes mellitus with microalbuminuria, without long-term current use of insulin [facility])   [facility] [place], Drue Stager, MD   1 year ago Controlled type 2 diabetes mellitus with microalbuminuria, without long-term current use of insulin [facility])   [facility] Steele Sizer, MD   1 year ago Bright red rectal bleeding   [facility] Steele Sizer, MD      Future Appointments            In 1 month Ancil Boozer, Drue Stager, MD [facility], [place]   In 1  month Vanga, Tally Due, MD [place]   In 2 months Fletcher Anon, Mertie Clause, MD [facility], [place]

## 2020-05-07 ENCOUNTER — Other Ambulatory Visit: Payer: Self-pay

## 2020-05-07 MED ORDER — LOSARTAN POTASSIUM-HCTZ 50-12.5 MG PO TABS: 1.0000 | ORAL_TABLET | Freq: Every day | ORAL | 0 refills | Status: DC

## 2020-05-29 DIAGNOSIS — B351 Tinea unguium: Secondary | ICD-10-CM | POA: Diagnosis not present

## 2020-05-29 DIAGNOSIS — L600 Ingrowing nail: Secondary | ICD-10-CM | POA: Diagnosis not present

## 2020-05-29 DIAGNOSIS — L03031 Cellulitis of right toe: Secondary | ICD-10-CM | POA: Diagnosis not present

## 2020-05-29 DIAGNOSIS — E119 Type 2 diabetes mellitus without complications: Secondary | ICD-10-CM | POA: Diagnosis not present

## 2020-06-07 ENCOUNTER — Ambulatory Visit: Payer: Medicaid Other | Admitting: Family Medicine

## 2020-06-07 ENCOUNTER — Encounter: Payer: Self-pay | Admitting: Family Medicine

## 2020-06-07 ENCOUNTER — Other Ambulatory Visit: Payer: Self-pay

## 2020-06-07 VITALS — BP 126/84 | HR 69 | Temp 97.1°F | Resp 16 | Ht 71.0 in | Wt 262.1 lb

## 2020-06-07 DIAGNOSIS — I10 Essential (primary) hypertension: Secondary | ICD-10-CM

## 2020-06-07 DIAGNOSIS — R809 Proteinuria, unspecified: Secondary | ICD-10-CM

## 2020-06-07 DIAGNOSIS — H9313 Tinnitus, bilateral: Secondary | ICD-10-CM | POA: Diagnosis not present

## 2020-06-07 DIAGNOSIS — R569 Unspecified convulsions: Secondary | ICD-10-CM | POA: Diagnosis not present

## 2020-06-07 DIAGNOSIS — N528 Other male erectile dysfunction: Secondary | ICD-10-CM

## 2020-06-07 DIAGNOSIS — R42 Dizziness and giddiness: Secondary | ICD-10-CM

## 2020-06-07 DIAGNOSIS — E1129 Type 2 diabetes mellitus with other diabetic kidney complication: Secondary | ICD-10-CM

## 2020-06-07 DIAGNOSIS — E1140 Type 2 diabetes mellitus with diabetic neuropathy, unspecified: Secondary | ICD-10-CM

## 2020-06-07 DIAGNOSIS — E1159 Type 2 diabetes mellitus with other circulatory complications: Secondary | ICD-10-CM | POA: Diagnosis not present

## 2020-06-07 LAB — POCT GLYCOSYLATED HEMOGLOBIN (HGB A1C): Hemoglobin A1C: 6 % — AB (ref 4.0–5.6)

## 2020-06-07 MED ORDER — DAPAGLIFLOZIN PROPANEDIOL 10 MG PO TABS: 10.0000 mg | ORAL_TABLET | Freq: Every day | ORAL | 0 refills | Status: DC

## 2020-06-07 MED ORDER — TRULICITY 1.5 MG/0.5ML ~~LOC~~ SOAJ: 1.5000 mg | SUBCUTANEOUS | 1 refills | Status: DC

## 2020-06-07 MED ORDER — SILDENAFIL CITRATE 100 MG PO TABS: 100.0000 mg | ORAL_TABLET | ORAL | 0 refills | Status: DC | PRN

## 2020-06-07 MED ORDER — LANTUS SOLOSTAR 100 UNIT/ML ~~LOC~~ SOPN: PEN_INJECTOR | SUBCUTANEOUS | 0 refills | Status: DC

## 2020-06-07 NOTE — Patient Instructions (Addendum)
Go down to 20 units of lantus, and check glucose every morning if glucose below 110 for 3 days go down by 4 units to keep fasting around 120   Stop Trajenta  Next visit we may stop Metformin

## 2020-06-07 NOTE — Progress Notes (Addendum)
Name: [name] [name]   MRN: [id number]    DOB: [birth year]   Date:06/07/2020       Progress Note  Subjective  Chief Complaint  Chief Complaint  Patient presents with  . Diabetes  . Hypertension  . Hyperlipidemia  . Sleep Apnea    HPI  DMII:A1C was down to 6.2% , 6.6%, went up to 6.7% today is down to 6 % He is on Lantus 24 units daily, metformin and Trajenta. Tolerating medications well. Glucose at home has been 120's fasting. Discussed changing medications to more cardiovascular protective drugs and also medications that help with weight loss. We will stop Tradjenta, wean off Lantus and continue Metformin. We will add [place] and Trulicity and recheck in about 2 months. No polyphagia, polydipsia or polyuria. He feels a little shaky in am's. He denies personal history of pancreatitis or family history of MEN  Constipation: doing well on Amitiza , Bristol scale of 3, daily, has follow up with Dr. Marius [name] coming up soon.   HTN/Angina : taking medication,bp when he first arrived was high but normalized with rest , he denies chest pain or palpitation at this time. BP at home has been 120's/80's. No side effects of medication . Angina is controlled with medication, last episode that he took NTG was about 7 months ago   Hyperlipidemia: compliant with medication, reviewed last labs , LDL at goal it was 68 , at goal   Tinnitus: also has some dizziness, he would like to go back to ENT, he also has some hearing loss.   Seizure: no recent episodes, he gets dizzy occasionally , but has been able to drive short distances.Sees Dr. Manuella [name] [facility] but not recently  OSA: he is wearing CPAP at least 6 times a week, he wears all night but does not sleep much at night. Grandkids keeps him up at night   Morbid obesity: BMI above 35 wih multiple co-morbidities. He has increased physical activity, three times a week walking at the mall. We will start him on Trulicity  Patient Active Problem  List   Diagnosis Date Noted  . Localized, primary osteoarthritis of hand 09/28/2019  . Seizure ([place]) 02/22/2018  . Mild cognitive impairment 11/17/2017  . Radiculopathy of cervical region 11/17/2017  . Stenosis of carotid artery 09/08/2017  . Cervical stenosis of spine 09/08/2017  . B12 deficiency 06/28/2017  . Headache disorder 02/15/2017  . Memory loss or impairment 02/15/2017  . Coronary artery disease involving native coronary artery of native heart   . Radiculitis of left cervical region 03/05/2016  . Diabetic neuropathy associated with type 2 diabetes mellitus ([place]) 12/05/2015  . Patellar subluxation 06/05/2015  . Allergic rhinitis, seasonal 06/05/2015  . Chronic constipation 06/05/2015  . Chronic lower back pain 06/05/2015  . Morbid obesity due to excess calories ([place]) 06/05/2015  . Vitamin D deficiency 06/05/2015  . Central sleep apnea 06/05/2015  . 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 Nerve root pain 06/05/2015  . Acquired polycythemia 06/05/2015  . Anterior knee pain 06/05/2015  . Dysmetabolic syndrome 76/28/3151  . Failure of erection 06/05/2015  . Gastro-esophageal reflux disease without esophagitis 06/05/2015  . Neuropathy 06/05/2015  . CAD (coronary artery disease)   . Angina, class III ([place]) 05/23/2015  . Essential hypertension 05/23/2015  . Hyperlipidemia 05/23/2015  . Inguinal hernia without mention of obstruction or gangrene, recurrent unilateral or unspecified 03/24/2013    Past Surgical History:  Procedure Laterality Date  . BACK SURGERY  12/2008   X2-LUMBAR  .  CARDIAC CATHETERIZATION N/A 05/30/2015   Procedure: Left Heart Cath;  Surgeon: [name] Hampshire, MD;  Location: [place] [facility];  Service: Cardiovascular;  Laterality: N/A;  . CARDIAC CATHETERIZATION N/A 04/16/2016   Procedure: Left Heart Cath and Coronary Angiography;  Surgeon: [name] Hampshire, MD;  Location: [place] [facility];  Service: Cardiovascular;  Laterality: N/A;  . COLONOSCOPY  2014    Dr. Jamal [name]  . COLONOSCOPY WITH PROPOFOL N/A 12/25/2016   Procedure: COLONOSCOPY WITH PROPOFOL;  Surgeon: [name] Bellows, MD;  Location: [facility] ENDOSCOPY;  Service: Endoscopy;  Laterality: N/A;  . COLONOSCOPY WITH PROPOFOL N/A 01/29/2017   Procedure: COLONOSCOPY WITH PROPOFOL;  Surgeon: [name] Bellows, MD;  Location: [facility] ENDOSCOPY;  Service: Endoscopy;  Laterality: N/A;  . COLONOSCOPY WITH PROPOFOL N/A 04/11/2020   Procedure: COLONOSCOPY WITH PROPOFOL;  Surgeon: [name] Bellows, MD;  Location: [facility] ENDOSCOPY;  Service: Gastroenterology;  Laterality: N/A;  . ESOPHAGOGASTRODUODENOSCOPY (EGD) WITH PROPOFOL N/A 01/26/2019   Procedure: ESOPHAGOGASTRODUODENOSCOPY (EGD) WITH PROPOFOL;  Surgeon: [name] Landsman, MD;  Location: [facility] ENDOSCOPY;  Service: Gastroenterology;  Laterality: N/A;  . EVALUATION UNDER ANESTHESIA WITH HEMORRHOIDECTOMY N/A 02/24/2017   Procedure: EXAM UNDER ANESTHESIA WITH POSSIBLE EXCISION OF INTERNAL HEMORRHOIDS;  Surgeon: [name] Ree, MD;  Location: [facility] ORS;  Service: General;  Laterality: N/A;  . FISSURECTOMY  02/24/2017   Procedure: FISSURECTOMY;  Surgeon: [name] Ree, MD;  Location: [facility] ORS;  Service: General;;  . HAND SURGERY Left 2020  . HERNIA REPAIR Right 1991  . INGUINAL HERNIA REPAIR Right 2012   Dr [name] [name]  . INGUINAL HERNIA REPAIR Right 2014   Dr [name] [name]    Family History  Problem Relation Age of Onset  . Heart Problems Mother   . Heart Problems Father 36       myocardial infarction  . Mental illness Brother   . Testicular cancer Paternal Uncle   . Other Maternal Aunt        COVID  . Breast cancer Cousin     Social History   Tobacco Use  . Smoking status: Former Smoker    Years: 2.00    Types: Cigars    Start date: 12/28/2002    Quit date: 06/04/2005    Years since quitting: 15.0  . Smokeless tobacco: Never Used  Substance Use Topics  . Alcohol use: Yes    Alcohol/week: 1.0 standard drink    Types: 1 Glasses of wine per week    Comment: RARE BEER      Current Outpatient Medications:  .  ACCU-CHEK AVIVA PLUS test strip, USE AS DIRECTED TO TEST BLOOD SUGAR THREE TIMES DAILY AND AS NEEDED, Disp: 100 strip, Rfl: 3 .  aspirin 81 MG tablet, Take 81 mg by mouth daily., Disp: , Rfl:  .  atorvastatin (LIPITOR) 80 MG tablet, Take 1 tablet (80 mg total) by mouth daily., Disp: 90 tablet, Rfl: 1 .  BD PEN NEEDLE NANO 2ND GEN 32G X 4 MM MISC, USE AS DIRECTED, Disp: 100 each, Rfl: 1 .  blood glucose meter kit and supplies KIT, Dispense based on patient and insurance preference. Use up to four times daily as directed. (FOR ICD-9 250.00, 250.01)., Disp: 1 each, Rfl: 0 .  carvedilol (COREG) 12.5 MG tablet, Take 1 tablet (12.5 mg total) by mouth 2 (two) times daily., Disp: 180 tablet, Rfl: 3 .  cetirizine (ZYRTEC) 10 MG tablet, Take 10 mg by mouth daily as needed for allergies. , Disp: , Rfl: 1 .  diclofenac sodium (VOLTAREN) 1 %  GEL, Apply 4 g topically 2 (two) times daily as needed., Disp: 100 g, Rfl: 5 .  dicyclomine (BENTYL) 10 MG capsule, TAKE 1 CAPSULE BY MOUTH THREE TIMES A DAY BEFORE MEALS, Disp: 90 capsule, Rfl: 0 .  ezetimibe (ZETIA) 10 MG tablet, TAKE 1 TABLET(10 MG) BY MOUTH DAILY, Disp: 90 tablet, Rfl: 1 .  fluticasone (FLONASE) 50 MCG/ACT nasal spray, Place 2 sprays into the nose daily as needed for allergies or rhinitis. , Disp: , Rfl:  .  ibuprofen (ADVIL) 800 MG tablet, Take by mouth., Disp: , Rfl:  .  insulin glargine (LANTUS SOLOSTAR) 100 UNIT/ML Solostar Pen, INJECT 28 UNITS INTO THE SKIN DAILY, Disp: 10 mL, Rfl: 0 .  losartan-hydrochlorothiazide (HYZAAR) 50-12.5 MG tablet, Take 1 tablet by mouth daily., Disp: 90 tablet, Rfl: 0 .  lubiprostone (AMITIZA) 24 MCG capsule, TAKE 1 CAPSULE(24 MCG) BY MOUTH TWICE DAILY WITH A MEAL, Disp: 60 capsule, Rfl: 0 .  metFORMIN (GLUCOPHAGE) 500 MG tablet, TAKE 1 TABLET(500 MG) BY MOUTH TWICE DAILY WITH A MEAL, Disp: 180 tablet, Rfl: 0 .  NITROSTAT 0.4 MG SL tablet, place 1 tablet under the tongue every  5 minutes for UP TO 3 doses if needed for angina as directed by prescriber, Disp: 25 tablet, Rfl: 1 .  ranolazine (RANEXA) 1000 MG SR tablet, TAKE 1 TABLET(1000 MG) BY MOUTH TWICE DAILY, Disp: 180 tablet, Rfl: 3 .  selenium sulfide (SELSUN) 2.5 % shampoo, Apply 1 application topically daily as needed for irritation., Disp: 118 mL, Rfl: 12 .  sildenafil (VIAGRA) 100 MG tablet, Take 1 tablet (100 mg total) by mouth as needed for erectile dysfunction., Disp: 30 tablet, Rfl: 0 .  dapagliflozin propanediol (FARXIGA) 10 MG TABS tablet, Take 1 tablet (10 mg total) by mouth daily before breakfast., Disp: 90 tablet, Rfl: 0 .  Dulaglutide (TRULICITY) 1.5 JO/8.7OM SOPN, Inject 0.5 mLs (1.5 mg total) into the skin once a week., Disp: 2 mL, Rfl: 1  Allergies  Allergen Reactions  . Gabapentin Other (See Comments)    Groggy-Mood Changes  . Latex Rash  . Penicillins Hives and Rash    Has patient had a PCN reaction causing immediate rash, facial/tongue/throat swelling, SOB or lightheadedness with hypotension: Yes Has patient had a PCN reaction causing severe rash involving mucus membranes or skin necrosis: No Has patient had a PCN reaction that required hospitalization No Has patient had a PCN reaction occurring within the last 10 years: No If all of the above answers are "NO", then may proceed with Cephalosporin use.     I personally reviewed active problem list, medication list, allergies, family history, social history, health maintenance with the patient/caregiver today.   ROS  Constitutional: Negative for fever or weight change.  Respiratory: Negative for cough and shortness of breath.   Cardiovascular: Negative for chest pain or palpitations.  Gastrointestinal: Negative for abdominal pain, no bowel changes.  Musculoskeletal: Negative for gait problem or joint swelling.  Skin: Negative for rash.  Neurological: positive  for dizziness and intermittent  headache.  No other specific complaints in a  complete review of systems (except as listed in HPI above).   Objective  Vitals:   06/07/20 0809 06/07/20 0825  BP: (!) 150/90 126/84  Pulse: 69   Resp: 16   Temp: (!) 97.1 F (36.2 C)   TempSrc: Temporal   SpO2: 94%   Weight: 262 lb 1.6 oz (118.9 kg)   Height: '5\' 11"'  (1.803 m)     Body mass index  is 36.56 kg/m.  Physical Exam  Constitutional: Patient appears well-developed and well-nourished. Obese  No distress.  HEENT: head atraumatic, normocephalic, pupils equal and reactive to light, ears normal TM,  neck supple, throat within normal limits Cardiovascular: Normal rate, regular rhythm and normal heart sounds.  No murmur heard. No BLE edema. Pulmonary/Chest: Effort normal and breath sounds normal. No respiratory distress. Abdominal: Soft.  There is no tenderness. Psychiatric: Patient has a normal mood and affect. behavior is normal. Judgment and thought content normal.  Recent Results (from the past 2160 hour(s))  SARS CORONAVIRUS 2 (TAT 6-24 HRS) Nasopharyngeal Nasopharyngeal Swab     Status: None   Collection Time: 04/09/20  9:10 AM   Specimen: Nasopharyngeal Swab  Result Value Ref Range   SARS Coronavirus 2 NEGATIVE NEGATIVE    Comment: (NOTE) SARS-CoV-2 target nucleic acids are NOT DETECTED. The SARS-CoV-2 RNA is generally detectable in upper and lower respiratory specimens during the acute phase of infection. Negative results do not preclude SARS-CoV-2 infection, do not rule out co-infections with other pathogens, and should not be used as the sole basis for treatment or other patient management decisions. Negative results must be combined with clinical observations, patient history, and epidemiological information. The expected result is Negative. Fact Sheet for Patients: SugarRoll.be Fact Sheet for Healthcare Providers: https://www.woods-mathews.com/ This test is not yet approved or cleared by the [place] FDA and   has been authorized for detection and/or diagnosis of SARS-CoV-2 by FDA under an Emergency Use Authorization (EUA). This EUA will remain  in effect (meaning this test can be used) for the duration of the COVID-19 declaration under Section 56 4(b)(1) of the Act, 21 U.S.C. section 360bbb-3(b)(1), unless the authorization is terminated or revoked sooner. Performed at [facility], [place] [address], [place], [place] [address]   Glucose, capillary     Status: Abnormal   Collection Time: 04/11/20  9:19 AM  Result Value Ref Range   Glucose-Capillary 114 (H) 70 - 99 mg/dL    Comment: Glucose reference range applies only to samples taken after fasting for at least 8 hours.  HM DIABETES EYE EXAM     Status: None   Collection Time: 04/18/20 12:00 AM  Result Value Ref Range   HM Diabetic Eye Exam No Retinopathy No Retinopathy  POCT HgB A1C     Status: Abnormal   Collection Time: 06/07/20  8:17 AM  Result Value Ref Range   Hemoglobin A1C 6.0 (A) 4.0 - 5.6 %   HbA1c POC (<> result, manual entry)     HbA1c, POC (prediabetic range)     HbA1c, POC (controlled diabetic range)        PHQ2/9: Depression screen [facility] 2/9 06/07/2020 01/29/2020 09/28/2019 06/12/2019 12/23/2018  Decreased Interest 0 0 0 0 0  Down, Depressed, Hopeless 0 0 0 0 0  PHQ - 2 Score 0 0 0 0 0  Altered sleeping 0 0 2 0 -  Tired, decreased energy 0 0 3 0 -  Change in appetite 0 0 1 0 -  Feeling bad or failure about yourself  0 0 0 0 -  Trouble concentrating 0 0 0 0 -  Moving slowly or fidgety/restless 0 0 0 0 -  Suicidal thoughts 0 0 0 0 -  PHQ-9 Score 0 0 6 0 -  Difficult doing work/chores - Not difficult at all Somewhat difficult Not difficult at all -  Some recent data might be hidden    phq 9 is negative  Fall Risk: Fall Risk  06/07/2020 01/29/2020 09/28/2019 06/12/2019 12/23/2018  Falls in the past year? 1 0 0 1 0  Number falls in past yr: 1 0 0 1 0  Injury with Fall? 1 0 0 1 1  Comment - - - Left Thumb -  Follow  up - - - - -     Functional Status Survey: Is the patient deaf or have difficulty hearing?: Yes Does the patient have difficulty seeing, even when wearing glasses/contacts?: No Does the patient have difficulty concentrating, remembering, or making decisions?: Yes Does the patient have difficulty walking or climbing stairs?: No Does the patient have difficulty dressing or bathing?: No Does the patient have difficulty doing errands alone such as visiting a doctor's office or shopping?: No    Assessment & Plan   1. Controlled type 2 diabetes mellitus with microalbuminuria, without long-term current use of insulin (HCC)  - POCT HgB A1C - Dulaglutide (TRULICITY) 1.5 DO/7.2VH SOPN; Inject 0.5 mLs (1.5 mg total) into the skin once a week.  Dispense: 2 mL; Refill: 1 - dapagliflozin propanediol (FARXIGA) 10 MG TABS tablet; Take 1 tablet (10 mg total) by mouth daily before breakfast.  Dispense: 90 tablet; Refill: 0 - insulin glargine (LANTUS SOLOSTAR) 100 UNIT/ML Solostar Pen; INJECT 28 UNITS INTO THE SKIN DAILY  Dispense: 10 mL; Refill: 0  2. Morbid obesity (HCC)  - Dulaglutide (TRULICITY) 1.5 QI/1.6YW SOPN; Inject 0.5 mLs (1.5 mg total) into the skin once a week.  Dispense: 2 mL; Refill: 1  3. Controlled type 2 diabetes with neuropathy (HCC)  - insulin glargine (LANTUS SOLOSTAR) 100 UNIT/ML Solostar Pen; INJECT 28 UNITS INTO THE SKIN DAILY  Dispense: 10 mL; Refill: 0  4. Seizure ([place])  Needs to go back to neurologist   5. Essential hypertension  At goal   6. Hypertension associated with diabetes ([place])  At goal   7. Tinnitus, bilateral  - Ambulatory referral to ENT  8. Dizziness  - Ambulatory referral to ENT

## 2020-06-07 NOTE — Addendum Note (Signed)
Addended by: Steele Sizer F on: 06/07/2020 03:44 PM   Modules accepted: Orders

## 2020-06-10 ENCOUNTER — Ambulatory Visit: Payer: Medicaid Other | Admitting: Gastroenterology

## 2020-06-12 ENCOUNTER — Other Ambulatory Visit: Payer: Self-pay

## 2020-06-12 MED ORDER — BD PEN NEEDLE NANO 2ND GEN 32G X 4 MM MISC: 12 refills | Status: DC

## 2020-06-18 DIAGNOSIS — H903 Sensorineural hearing loss, bilateral: Secondary | ICD-10-CM | POA: Diagnosis not present

## 2020-06-18 DIAGNOSIS — R42 Dizziness and giddiness: Secondary | ICD-10-CM | POA: Diagnosis not present

## 2020-06-18 DIAGNOSIS — H9319 Tinnitus, unspecified ear: Secondary | ICD-10-CM | POA: Diagnosis not present

## 2020-06-24 ENCOUNTER — Ambulatory Visit: Payer: Medicaid Other | Admitting: Gastroenterology

## 2020-07-05 DIAGNOSIS — L600 Ingrowing nail: Secondary | ICD-10-CM | POA: Diagnosis not present

## 2020-07-05 DIAGNOSIS — L03031 Cellulitis of right toe: Secondary | ICD-10-CM | POA: Diagnosis not present

## 2020-07-25 ENCOUNTER — Ambulatory Visit (INDEPENDENT_AMBULATORY_CARE_PROVIDER_SITE_OTHER): Payer: Medicaid Other | Admitting: Cardiovascular Disease

## 2020-07-25 ENCOUNTER — Other Ambulatory Visit: Payer: Self-pay

## 2020-07-25 ENCOUNTER — Encounter: Payer: Self-pay | Admitting: Cardiovascular Disease

## 2020-07-25 VITALS — BP 124/72 | HR 66 | Ht 71.0 in | Wt 249.1 lb

## 2020-07-25 DIAGNOSIS — E782 Mixed hyperlipidemia: Secondary | ICD-10-CM

## 2020-07-25 DIAGNOSIS — I10 Essential (primary) hypertension: Secondary | ICD-10-CM

## 2020-07-25 DIAGNOSIS — L03031 Cellulitis of right toe: Secondary | ICD-10-CM | POA: Diagnosis not present

## 2020-07-25 DIAGNOSIS — I25118 Atherosclerotic heart disease of native coronary artery with other forms of angina pectoris: Secondary | ICD-10-CM

## 2020-07-25 DIAGNOSIS — L600 Ingrowing nail: Secondary | ICD-10-CM | POA: Diagnosis not present

## 2020-07-25 NOTE — Progress Notes (Signed)
Cardiology Office Note   Date:  07/25/2020   ID:  Merrit, Friesen [birth year], MRN [id number]  PCP:  Steele Sizer, MD  Cardiologist:   [name], MD   Chief Complaint  Patient presents with  . other    6 month follow up. Meds reviewed by the pt. verbally. Pt. c/o shortness of breath with over exertion, chest pressure when he first gets up with having to take a NTG pill and then breaks out into a sweat, has some chest pain that radiates down his left arm with walking.       History of Present Illness: [name] [name] is a 59 y.o. male who presents for a follow-up visit regarding mild -moderate one-vessel coronary artery disease with stable angina and suspected endothelial dysfunction. He has known history of type 2 diabetes, hypertension, hyperlipidemia and obesity. He is a previous smoker. Cardiac catheterization in June 2016 showed moderate mid LAD stenosis (50%) with an FFR ratio of 0.83. LV systolic function was normal with normal left ventricular end-diastolic pressure. He had worsening angina in April 2017. Repeat cardiac catheterization showed improvement in mid LAD stenosis to 30% and no evidence of obstructive coronary artery disease.  He does have sleep apnea on CPAP . The patient had recurrent episodes of loss of consciousness of unclear etiology in 2018.  Echocardiogram showed normal ejection fraction.  2-week outpatient monitor showed no significant arrhythmia.  It was felt that his episodes are not cardiac in origin and he has been evaluated by neurology but no clear etiology was identified according to him.    The patient did have recurrent concussions growing up when he played sports.  He has done well with medical therapy for angina and responded very well to Ranexa.  He is also on carvedilol.  However, over the last 2 months, he has experienced significant exertional chest pain and tightness.  This happens when he starts exercising and then he breaks out in a  sweat.  The pain lasts for 15 to 30 minutes and he has to take sublingual nitroglycerin.  This has limited his ability to exercise and be more active.  His diabetes control has improved with most recent hemoglobin A1c of 6.    Past Medical History:  Diagnosis Date  . Allergy   . Angina, class III ([place])   . Chronic constipation   . Degeneration of intervertebral disc of cervical region   . Diabetes mellitus without complication ([place])   . Diastolic dysfunction    a. 06/2017 Echo: EF 60-65%, no rwma, Gr1 DD.  Marland Kitchen Dyspnea   . GERD (gastroesophageal reflux disease)   . Hernia 1991   02/10/2012-RIH repair  . Hyperlipidemia   . Hypertension 2008  . Nerve root pain   . Neuropathy   . Non-obstructive CAD (coronary artery disease)    a. 05/30/2015 cath: LM nl, mLAD 50% (FFR 0.83), LCx minor irregs, RCA minor irregs, EF 55-65%-->Med Rx; b. 03/2016 Cath: LM nl LAD 23m D1/2/3 min irregs, LCX min irregs, OM1/2/3 nl, RCA min irregs, RPDA/RPAV/RPL1/RPL2 nl-->Med Rx.  . Obesity, unspecified 2012  . Personal history of tobacco use, presenting hazards to health 2012  . Polycythemia   . Rectal bleeding 02/11/2017  . Recurrent Right Inguinal Hernia Repair 02/09/2011[facility] . Sleep apnea    a. On CPAP;  b. 06/2017 Echo: no PAH.  .Marland KitchenUmbilical hernia without mention of obstruction or gangrene 02/09/2011   [facility] .  Vitamin D deficiency     Past Surgical History:  Procedure Laterality Date  . BACK SURGERY  12/2008   X2-LUMBAR  . CARDIAC CATHETERIZATION N/A 05/30/2015   Procedure: Left Heart Cath;  Surgeon: Wellington Hampshire, MD;  Location: [facility];  Service: Cardiovascular;  Laterality: N/A;  . CARDIAC CATHETERIZATION N/A 04/16/2016   Procedure: Left Heart Cath and Coronary Angiography;  Surgeon: Wellington Hampshire, MD;  Location: [place] [facility];  Service: Cardiovascular;  Laterality: N/A;  . COLONOSCOPY  2014   Dr. [name]  . COLONOSCOPY WITH PROPOFOL N/A  12/25/2016   Procedure: COLONOSCOPY WITH PROPOFOL;  Surgeon: Jonathon Bellows, MD;  Location: [facility];  Service: Endoscopy;  Laterality: N/A;  . COLONOSCOPY WITH PROPOFOL N/A 01/29/2017   Procedure: COLONOSCOPY WITH PROPOFOL;  Surgeon: Jonathon Bellows, MD;  Location: [facility] ENDOSCOPY;  Service: Endoscopy;  Laterality: N/A;  . COLONOSCOPY WITH PROPOFOL N/A 04/11/2020   Procedure: COLONOSCOPY WITH PROPOFOL;  Surgeon: Jonathon Bellows, MD;  Location: [facility] ENDOSCOPY;  Service: Gastroenterology;  Laterality: N/A;  . ESOPHAGOGASTRODUODENOSCOPY (EGD) WITH PROPOFOL N/A 01/26/2019   Procedure: ESOPHAGOGASTRODUODENOSCOPY (EGD) WITH PROPOFOL;  Surgeon: Lin Landsman, MD;  Location: [facility] ENDOSCOPY;  Service: Gastroenterology;  Laterality: N/A;  . EVALUATION UNDER ANESTHESIA WITH HEMORRHOIDECTOMY N/A 02/24/2017   Procedure: EXAM UNDER ANESTHESIA WITH POSSIBLE EXCISION OF INTERNAL HEMORRHOIDS;  Surgeon: Olean Ree, MD;  Location: [facility] ORS;  Service: General;  Laterality: N/A;  . FISSURECTOMY  02/24/2017   Procedure: FISSURECTOMY;  Surgeon: Olean Ree, MD;  Location: [facility] ORS;  Service: General;;  . HAND SURGERY Left 2020  . HERNIA REPAIR Right 1991  . INGUINAL HERNIA REPAIR Right 2012   Dr [name]  . INGUINAL HERNIA REPAIR Right 2014   Dr [name]     Current Outpatient Medications  Medication Sig Dispense Refill  . ACCU-CHEK AVIVA PLUS test strip USE AS DIRECTED TO TEST BLOOD SUGAR THREE TIMES DAILY AND AS NEEDED 100 strip 3  . aspirin 81 MG tablet Take 81 mg by mouth daily.    Marland Kitchen atorvastatin (LIPITOR) 80 MG tablet Take 1 tablet (80 mg total) by mouth daily. 90 tablet 1  . blood glucose meter kit and supplies KIT Dispense based on patient and insurance preference. Use up to four times daily as directed. (FOR ICD-9 250.00, 250.01). 1 each 0  . carvedilol (COREG) 12.5 MG tablet Take 1 tablet (12.5 mg total) by mouth 2 (two) times daily. 180 tablet 3  . cetirizine (ZYRTEC) 10 MG tablet Take 10 mg by mouth daily as  needed for allergies.   1  . dapagliflozin propanediol (FARXIGA) 10 MG TABS tablet Take 1 tablet (10 mg total) by mouth daily before breakfast. 90 tablet 0  . diclofenac sodium (VOLTAREN) 1 % GEL Apply 4 g topically 2 (two) times daily as needed. 100 g 5  . dicyclomine (BENTYL) 10 MG capsule TAKE 1 CAPSULE BY MOUTH THREE TIMES A DAY BEFORE MEALS 90 capsule 0  . Dulaglutide (TRULICITY) 1.5 WV/3.7TG SOPN Inject 0.5 mLs (1.5 mg total) into the skin once a week. 2 mL 1  . ezetimibe (ZETIA) 10 MG tablet TAKE 1 TABLET(10 MG) BY MOUTH DAILY 90 tablet 1  . fluticasone (FLONASE) 50 MCG/ACT nasal spray Place 2 sprays into the nose daily as needed for allergies or rhinitis.     Marland Kitchen ibuprofen (ADVIL) 800 MG tablet Take by mouth.    . insulin glargine (LANTUS SOLOSTAR) 100 UNIT/ML Solostar Pen INJECT 28 UNITS INTO THE SKIN  DAILY 10 mL 0  . Insulin Pen Needle (BD PEN NEEDLE NANO 2ND GEN) 32G X 4 MM MISC USE AS DIRECTED 100 each 12  . losartan-hydrochlorothiazide (HYZAAR) 50-12.5 MG tablet Take 1 tablet by mouth daily. 90 tablet 0  . lubiprostone (AMITIZA) 24 MCG capsule TAKE 1 CAPSULE(24 MCG) BY MOUTH TWICE DAILY WITH A MEAL 60 capsule 0  . metFORMIN (GLUCOPHAGE) 500 MG tablet TAKE 1 TABLET(500 MG) BY MOUTH TWICE DAILY WITH A MEAL 180 tablet 0  . NITROSTAT 0.4 MG SL tablet place 1 tablet under the tongue every 5 minutes for UP TO 3 doses if needed for angina as directed by prescriber 25 tablet 1  . ranolazine (RANEXA) 1000 MG SR tablet TAKE 1 TABLET(1000 MG) BY MOUTH TWICE DAILY 180 tablet 3  . selenium sulfide (SELSUN) 2.5 % shampoo Apply 1 application topically daily as needed for irritation. 118 mL 12  . sildenafil (VIAGRA) 100 MG tablet Take 1 tablet (100 mg total) by mouth as needed for erectile dysfunction. 30 tablet 0   No current facility-administered medications for this visit.    Allergies:   Gabapentin, Latex, and Penicillins    Social History:  The patient  reports that he quit smoking about 15  years ago. His smoking use included cigars. He started smoking about 17 years ago. He quit after 2.00 years of use. He has never used smokeless tobacco. He reports current alcohol use of about 1.0 standard drink of alcohol per week. He reports that he does not use drugs.   Family History:  The patient's family history includes Breast cancer in his cousin; Heart Problems in his mother; Heart Problems (age of onset: 83) in his father; Mental illness in his brother; Other in his maternal aunt; Testicular cancer in his paternal uncle.    ROS:  Please see the history of present illness.   Otherwise, review of systems are positive for none.   All other systems are reviewed and negative.    PHYSICAL EXAM: VS:  BP 124/72 (BP Location: Left Arm, Patient Position: Sitting, Cuff Size: Normal)   Pulse 66   Ht _0  (1.803 m)   Wt (!) 249 lb 2 oz (113 kg)   SpO2 98%   BMI 34.75 kg/m  , BMI Body mass index is 34.75 kg/m. GEN: Well nourished, well developed, in no acute distress  HEENT: normal  Neck: no JVD, carotid bruits, or masses Cardiac: RRR; no murmurs, rubs, or gallops,no edema  Respiratory:  clear to auscultation bilaterally, normal work of breathing GI: soft, nontender, nondistended, + BS MS: no deformity or atrophy  Skin: warm and dry, no rash Neuro:  Strength and sensation are intact Psych: euthymic mood, full affect Right radial pulses normal.   EKG:  EKG is  ordered today.  EKG shows normal sinus rhythm with anterolateral T wave changes suggestive of ischemia.  Recent Labs: 09/28/2019: ALT 31; BUN 12; Creat 1.09; Hemoglobin 15.3; Platelets 257; Potassium 4.2; Sodium 137    Lipid Panel    Component Value Date/Time   CHOL 133 09/28/2019 0000   CHOL 167 05/08/2016 0803   TRIG 146 09/28/2019 0000   HDL 42 09/28/2019 0000   HDL 52 05/08/2016 0803   CHOLHDL 3.2 09/28/2019 0000   LDLCALC 68 09/28/2019 0000      Wt Readings from Last 3 Encounters:  07/25/20 (!) 249 lb 2 oz  (113 kg)  06/07/20 262 lb 1.6 oz (118.9 kg)  04/11/20 260 lb 8.6 oz (118.2  kg)        ASSESSMENT AND PLAN:  1.  Coronary artery disease involving native coronary arteries with worsening stable angina, the patient symptoms previously were controlled on carvedilol and Ranexa.  However, he is now restarting exertional symptoms suggestive of class III angina in spite of maximal antianginal medications.  He is known to have coronary artery disease with most recent cardiac catheterization in 2017.  Given his risk factors, there is high chance for progression of disease.  Due to that, I recommend proceeding with left heart catheterization and possible PCI.  I discussed the procedure in details as well as risk and benefits.  Planned access is via the right radial artery.    2. Hyperlipidemia:  Continue treatment with high dose atorvastatin.  Most recent lipid profile showed an LDL of 68 and triglyceride of 146.  3. Essential hypertension: Blood pressure is well controlled.  4.  Type 2 diabetes: Well controlled with most recent hemoglobin A1c of 6.   Disposition:   FU with me in 1 months  Signed,  [name], MD  07/25/2020 10:35 AM    [place]

## 2020-07-25 NOTE — Patient Instructions (Addendum)
Medication Instructions:  Your physician recommends that you continue on your current medications as directed. Please refer to the Current Medication list given to you today.  *If you need a refill on your cardiac medications before your next appointment, please call your pharmacy*   Lab Work: Bmet and Cbc today.   You will need a COVID test on 08/02/20. Please report to the [facility] medical arts building drive up test site between 8am-1pm.  If you have labs (blood work) drawn today and your tests are completely normal, you will receive your results only by: Marland Kitchen MyChart Message (if you have MyChart) OR . A paper copy in the mail If you have any lab test that is abnormal or we need to change your treatment, we will call you to review the results.   Testing/Procedures: Your physician has requested that you have a cardiac catheterization. Cardiac catheterization is used to diagnose and/or treat various heart conditions. Doctors may recommend this procedure for a number of different reasons. The most common reason is to evaluate chest pain. Chest pain can be a symptom of coronary artery disease (CAD), and cardiac catheterization can show whether plaque is narrowing or blocking your heart's arteries. This procedure is also used to evaluate the valves, as well as measure the blood flow and oxygen levels in different parts of your heart. For further information please visit [email or web address]. Please follow instruction sheet, as given.     Follow-Up: At [facility], you and your health needs are our priority.  As part of our continuing mission to provide you with exceptional heart care, we have created designated Provider Care Teams.  These Care Teams include your primary Cardiologist (physician) and Advanced Practice Providers (APPs -  Physician Assistants and Nurse Practitioners) who all work together to provide you with the care you need, when you need it.  We recommend signing up for the patient  portal called "MyChart".  Sign up information is provided on this After Visit Summary.  MyChart is used to connect with patients for Virtual Visits (Telemedicine).  Patients are able to view lab/test results, encounter notes, upcoming appointments, etc.  Non-urgent messages can be sent to your provider as well.   To learn more about what you can do with MyChart, go to [email or web address].    Your next appointment:   4 week(s)  The format for your next appointment:   In Person  Provider:    You may see [name], MD or one of the following Advanced Practice Providers on your designated Care Team:    Murray Hodgkins, NP  Christell Faith, PA-C  Marrianne Mood, PA-C    Other Instructions [facility] Cardiac Cath Instructions   You are scheduled for a Cardiac Cath on: 08/05/20 with Dr. Fletcher Anon  Please arrive at 8:30 am on the day of your procedure  Please expect a call from our [place] to pre-register you  Do not eat/drink anything after midnight  Someone will need to drive you home  It is recommended someone be with you for the first 24 hours after your procedure  Wear clothes that are easy to get on/off and wear slip on shoes if possible   Medications bring a current list of all medications with you  _X__ Do not take these medications before your procedure: -Metformin the morning of the procedure and for 48 hours after the procedure. -Take 1/2 of your normal insulin units the morning of the procedure.    You may take all  of your other medications the morning of your procedure with enough water to swallow safely   Day of your procedure: Arrive at the [place] entrance.  Free valet service is available.  After entering the [place] please check-in at the registration desk (1st desk on your right) to receive your armband. After receiving your armband someone will escort you to the cardiac cath/special procedures waiting area.  The usual length  of stay after your procedure is about 2 to 3 hours.  This can vary.  If you have any questions, please call our office at [phone], or you may call the cardiac cath lab at [facility] directly at [phone]

## 2020-07-25 NOTE — H&P (View-Only) (Signed)
  Cardiology Office Note   Date:  07/25/2020   ID:  [name] [name], DOB 10/13/1961, MRN [id number]  PCP:  Sowles, Krichna, MD  Cardiologist:   Shammara Jarrett, MD   Chief Complaint  Patient presents with  . other    6 month follow up. Meds reviewed by the pt. verbally. Pt. c/o shortness of breath with over exertion, chest pressure when he first gets up with having to take a NTG pill and then breaks out into a sweat, has some chest pain that radiates down his left arm with walking.       History of Present Illness: [name] [name] is a 58 y.o. male who presents for a follow-up visit regarding mild -moderate one-vessel coronary artery disease with stable angina and suspected endothelial dysfunction. He has known history of type 2 diabetes, hypertension, hyperlipidemia and obesity. He is a previous smoker. Cardiac catheterization in June 2016 showed moderate mid LAD stenosis (50%) with an FFR ratio of 0.83. LV systolic function was normal with normal left ventricular end-diastolic pressure. He had worsening angina in April 2017. Repeat cardiac catheterization showed improvement in mid LAD stenosis to 30% and no evidence of obstructive coronary artery disease.  He does have sleep apnea on CPAP . The patient had recurrent episodes of loss of consciousness of unclear etiology in 2018.  Echocardiogram showed normal ejection fraction.  2-week outpatient monitor showed no significant arrhythmia.  It was felt that his episodes are not cardiac in origin and he has been evaluated by neurology but no clear etiology was identified according to him.    The patient did have recurrent concussions growing up when he played sports.  He has done well with medical therapy for angina and responded very well to Ranexa.  He is also on carvedilol.  However, over the last 2 months, he has experienced significant exertional chest pain and tightness.  This happens when he starts exercising and then he breaks out in a  sweat.  The pain lasts for 15 to 30 minutes and he has to take sublingual nitroglycerin.  This has limited his ability to exercise and be more active.  His diabetes control has improved with most recent hemoglobin A1c of 6.    Past Medical History:  Diagnosis Date  . Allergy   . Angina, class III (HCC)   . Chronic constipation   . Degeneration of intervertebral disc of cervical region   . Diabetes mellitus without complication (HCC)   . Diastolic dysfunction    a. 06/2017 Echo: EF 60-65%, no rwma, Gr1 DD.  . Dyspnea   . GERD (gastroesophageal reflux disease)   . Hernia 1991   02/10/2012-[facility] repair  . Hyperlipidemia   . Hypertension 2008  . Nerve root pain   . Neuropathy   . Non-obstructive CAD (coronary artery disease)    a. 05/30/2015 cath: LM nl, mLAD 50% (FFR 0.83), LCx minor irregs, RCA minor irregs, EF 55-65%-->Med Rx; b. 03/2016 Cath: LM nl LAD 30m, D1/2/3 min irregs, LCX min irregs, OM1/2/3 nl, RCA min irregs, RPDA/RPAV/RPL1/RPL2 nl-->Med Rx.  . Obesity, unspecified 2012  . Personal history of tobacco use, presenting hazards to health 2012  . Polycythemia   . Rectal bleeding 02/11/2017  . Recurrent Right Inguinal Hernia Repair 02/09/2011, 04/07/2013   Dr Sankar, [facility]  . Sleep apnea    a. On CPAP;  b. 06/2017 Echo: no PAH.  . Umbilical hernia without mention of obstruction or gangrene 02/09/2011   Dr Sankar, [facility]  .   Vitamin D deficiency     Past Surgical History:  Procedure Laterality Date  . BACK SURGERY  12/2008   X2-LUMBAR  . CARDIAC CATHETERIZATION N/A 05/30/2015   Procedure: Left Heart Cath;  Surgeon: Alijah Hyde A September Mormile, MD;  Location: [facility];  Service: Cardiovascular;  Laterality: N/A;  . CARDIAC CATHETERIZATION N/A 04/16/2016   Procedure: Left Heart Cath and Coronary Angiography;  Surgeon: Jeannene Tschetter A Connelly [place], MD;  Location: [facility];  Service: Cardiovascular;  Laterality: N/A;  . COLONOSCOPY  2014   Dr. Sankar  . COLONOSCOPY WITH PROPOFOL N/A  12/25/2016   Procedure: COLONOSCOPY WITH PROPOFOL;  Surgeon: Kiran Anna, MD;  Location: [facility];  Service: Endoscopy;  Laterality: N/A;  . COLONOSCOPY WITH PROPOFOL N/A 01/29/2017   Procedure: COLONOSCOPY WITH PROPOFOL;  Surgeon: Kiran Anna, MD;  Location: [facility];  Service: Endoscopy;  Laterality: N/A;  . COLONOSCOPY WITH PROPOFOL N/A 04/11/2020   Procedure: COLONOSCOPY WITH PROPOFOL;  Surgeon: Anna, Kiran, MD;  Location: [facility];  Service: Gastroenterology;  Laterality: N/A;  . ESOPHAGOGASTRODUODENOSCOPY (EGD) WITH PROPOFOL N/A 01/26/2019   Procedure: ESOPHAGOGASTRODUODENOSCOPY (EGD) WITH PROPOFOL;  Surgeon: Vanga, Rohini Reddy, MD;  Location: [facility];  Service: Gastroenterology;  Laterality: N/A;  . EVALUATION UNDER ANESTHESIA WITH HEMORRHOIDECTOMY N/A 02/24/2017   Procedure: EXAM UNDER ANESTHESIA WITH POSSIBLE EXCISION OF INTERNAL HEMORRHOIDS;  Surgeon: Jose Piscoya, MD;  Location: [facility];  Service: General;  Laterality: N/A;  . FISSURECTOMY  02/24/2017   Procedure: FISSURECTOMY;  Surgeon: Jose Piscoya, MD;  Location: [facility];  Service: General;;  . HAND SURGERY Left 2020  . HERNIA REPAIR Right 1991  . INGUINAL HERNIA REPAIR Right 2012   Dr Sankar  . INGUINAL HERNIA REPAIR Right 2014   Dr Sankar     Current Outpatient Medications  Medication Sig Dispense Refill  . ACCU-CHEK AVIVA PLUS test strip USE AS DIRECTED TO TEST BLOOD SUGAR THREE TIMES DAILY AND AS NEEDED 100 strip 3  . aspirin 81 MG tablet Take 81 mg by mouth daily.    . atorvastatin (LIPITOR) 80 MG tablet Take 1 tablet (80 mg total) by mouth daily. 90 tablet 1  . blood glucose meter kit and supplies KIT Dispense based on patient and insurance preference. Use up to four times daily as directed. (FOR ICD-9 250.00, 250.01). 1 each 0  . carvedilol (COREG) 12.5 MG tablet Take 1 tablet (12.5 mg total) by mouth 2 (two) times daily. 180 tablet 3  . cetirizine (ZYRTEC) 10 MG tablet Take 10 mg by mouth daily as  needed for allergies.   1  . dapagliflozin propanediol (FARXIGA) 10 MG TABS tablet Take 1 tablet (10 mg total) by mouth daily before breakfast. 90 tablet 0  . diclofenac sodium (VOLTAREN) 1 % GEL Apply 4 g topically 2 (two) times daily as needed. 100 g 5  . dicyclomine (BENTYL) 10 MG capsule TAKE 1 CAPSULE BY MOUTH THREE TIMES A DAY BEFORE MEALS 90 capsule 0  . Dulaglutide (TRULICITY) 1.5 MG/0.5ML SOPN Inject 0.5 mLs (1.5 mg total) into the skin once a week. 2 mL 1  . ezetimibe (ZETIA) 10 MG tablet TAKE 1 TABLET(10 MG) BY MOUTH DAILY 90 tablet 1  . fluticasone (FLONASE) 50 MCG/ACT nasal spray Place 2 sprays into the nose daily as needed for allergies or rhinitis.     . ibuprofen (ADVIL) 800 MG tablet Take by mouth.    . insulin glargine (LANTUS SOLOSTAR) 100 UNIT/ML Solostar Pen INJECT 28 UNITS INTO THE SKIN   DAILY 10 mL 0  . Insulin Pen Needle (BD PEN NEEDLE NANO 2ND GEN) 32G X 4 MM MISC USE AS DIRECTED 100 each 12  . losartan-hydrochlorothiazide (HYZAAR) 50-12.5 MG tablet Take 1 tablet by mouth daily. 90 tablet 0  . lubiprostone (AMITIZA) 24 MCG capsule TAKE 1 CAPSULE(24 MCG) BY MOUTH TWICE DAILY WITH A MEAL 60 capsule 0  . metFORMIN (GLUCOPHAGE) 500 MG tablet TAKE 1 TABLET(500 MG) BY MOUTH TWICE DAILY WITH A MEAL 180 tablet 0  . NITROSTAT 0.4 MG SL tablet place 1 tablet under the tongue every 5 minutes for UP TO 3 doses if needed for angina as directed by prescriber 25 tablet 1  . ranolazine (RANEXA) 1000 MG SR tablet TAKE 1 TABLET(1000 MG) BY MOUTH TWICE DAILY 180 tablet 3  . selenium sulfide (SELSUN) 2.5 % shampoo Apply 1 application topically daily as needed for irritation. 118 mL 12  . sildenafil (VIAGRA) 100 MG tablet Take 1 tablet (100 mg total) by mouth as needed for erectile dysfunction. 30 tablet 0   No current facility-administered medications for this visit.    Allergies:   Gabapentin, Latex, and Penicillins    Social History:  The patient  reports that he quit smoking about 15  years ago. His smoking use included cigars. He started smoking about 17 years ago. He quit after 2.00 years of use. He has never used smokeless tobacco. He reports current alcohol use of about 1.0 standard drink of alcohol per week. He reports that he does not use drugs.   Family History:  The patient's family history includes Breast cancer in his cousin; Heart Problems in his mother; Heart Problems (age of onset: 38) in his father; Mental illness in his brother; Other in his maternal aunt; Testicular cancer in his paternal uncle.    ROS:  Please see the history of present illness.   Otherwise, review of systems are positive for none.   All other systems are reviewed and negative.    PHYSICAL EXAM: VS:  BP 124/72 (BP Location: Left Arm, Patient Position: Sitting, Cuff Size: Normal)   Pulse 66   Ht 5' 11" (1.803 m)   Wt (!) 249 lb 2 oz (113 kg)   SpO2 98%   BMI 34.75 kg/m  , BMI Body mass index is 34.75 kg/m. GEN: Well nourished, well developed, in no acute distress  HEENT: normal  Neck: no JVD, carotid bruits, or masses Cardiac: RRR; no murmurs, rubs, or gallops,no edema  Respiratory:  clear to auscultation bilaterally, normal work of breathing GI: soft, nontender, nondistended, + BS MS: no deformity or atrophy  Skin: warm and dry, no rash Neuro:  Strength and sensation are intact Psych: euthymic mood, full affect Right radial pulses normal.   EKG:  EKG is  ordered today.  EKG shows normal sinus rhythm with anterolateral T wave changes suggestive of ischemia.  Recent Labs: 09/28/2019: ALT 31; BUN 12; Creat 1.09; Hemoglobin 15.3; Platelets 257; Potassium 4.2; Sodium 137    Lipid Panel    Component Value Date/Time   CHOL 133 09/28/2019 0000   CHOL 167 05/08/2016 0803   TRIG 146 09/28/2019 0000   HDL 42 09/28/2019 0000   HDL 52 05/08/2016 0803   CHOLHDL 3.2 09/28/2019 0000   LDLCALC 68 09/28/2019 0000      Wt Readings from Last 3 Encounters:  07/25/20 (!) 249 lb 2 oz  (113 kg)  06/07/20 262 lb 1.6 oz (118.9 kg)  04/11/20 260 lb 8.6 oz (118.2   kg)        ASSESSMENT AND PLAN:  1.  Coronary artery disease involving native coronary arteries with worsening stable angina, the patient symptoms previously were controlled on carvedilol and Ranexa.  However, he is now restarting exertional symptoms suggestive of class III angina in spite of maximal antianginal medications.  He is known to have coronary artery disease with most recent cardiac catheterization in 2017.  Given his risk factors, there is high chance for progression of disease.  Due to that, I recommend proceeding with left heart catheterization and possible PCI.  I discussed the procedure in details as well as risk and benefits.  Planned access is via the right radial artery.    2. Hyperlipidemia:  Continue treatment with high dose atorvastatin.  Most recent lipid profile showed an LDL of 68 and triglyceride of 146.  3. Essential hypertension: Blood pressure is well controlled.  4.  Type 2 diabetes: Well controlled with most recent hemoglobin A1c of 6.   Disposition:   FU with me in 1 months  Signed,  Donnis Pecha, MD  07/25/2020 10:35 AM    [facility] 

## 2020-07-26 LAB — BASIC METABOLIC PANEL
BUN/Creatinine Ratio: 13 (ref 9–20)
BUN: 15 mg/dL (ref 6–24)
CO2: 22 mmol/L (ref 20–29)
Calcium: 9.5 mg/dL (ref 8.7–10.2)
Chloride: 99 mmol/L (ref 96–106)
Creatinine, Ser: 1.12 mg/dL (ref 0.76–1.27)
GFR calc Af Amer: 84 mL/min/{1.73_m2} (ref >59)
GFR calc non Af Amer: 73 mL/min/{1.73_m2} (ref >59)
Glucose: 123 mg/dL — ABNORMAL HIGH (ref 65–99)
Potassium: 3.8 mmol/L (ref 3.5–5.2)
Sodium: 137 mmol/L (ref 134–144)

## 2020-07-26 LAB — CBC WITH DIFFERENTIAL/PLATELET
Basophils Absolute: 0.1 10*3/uL (ref 0.0–0.2)
Basos: 1 %
EOS (ABSOLUTE): 0.1 10*3/uL (ref 0.0–0.4)
Eos: 1 %
Hematocrit: 46.7 % (ref 37.5–51.0)
Hemoglobin: 16.8 g/dL (ref 13.0–17.7)
Immature Grans (Abs): 0.1 10*3/uL (ref 0.0–0.1)
Immature Granulocytes: 1 %
Lymphocytes Absolute: 3.3 10*3/uL — ABNORMAL HIGH (ref 0.7–3.1)
Lymphs: 41 %
MCH: 33.7 pg — ABNORMAL HIGH (ref 26.6–33.0)
MCHC: 36 g/dL — ABNORMAL HIGH (ref 31.5–35.7)
MCV: 94 fL (ref 79–97)
Monocytes Absolute: 0.7 10*3/uL (ref 0.1–0.9)
Monocytes: 9 %
Neutrophils Absolute: 3.7 10*3/uL (ref 1.4–7.0)
Neutrophils: 47 %
Platelets: 238 10*3/uL (ref 150–450)
RBC: 4.98 x10E6/uL (ref 4.14–5.80)
RDW: 13 % (ref 11.6–15.4)
WBC: 8 10*3/uL (ref 3.4–10.8)

## 2020-08-01 ENCOUNTER — Other Ambulatory Visit: Payer: Self-pay | Admitting: Family Medicine

## 2020-08-01 DIAGNOSIS — R809 Proteinuria, unspecified: Secondary | ICD-10-CM

## 2020-08-02 ENCOUNTER — Other Ambulatory Visit: Payer: Self-pay

## 2020-08-02 ENCOUNTER — Other Ambulatory Visit
Admission: RE | Admit: 2020-08-02 | Discharge: 2020-08-02 | Disposition: A | Discharge status: Home / Self Care | Payer: Medicaid Other | Source: Ambulatory Visit | Attending: Cardiovascular Disease | Admitting: Cardiovascular Disease

## 2020-08-02 DIAGNOSIS — Z01812 Encounter for preprocedural laboratory examination: Secondary | ICD-10-CM | POA: Diagnosis not present

## 2020-08-02 DIAGNOSIS — Z20822 Contact with and (suspected) exposure to covid-19: Secondary | ICD-10-CM | POA: Diagnosis not present

## 2020-08-02 LAB — SARS CORONAVIRUS 2 (TAT 6-24 HRS): SARS Coronavirus 2: NEGATIVE

## 2020-08-05 ENCOUNTER — Other Ambulatory Visit: Payer: Self-pay

## 2020-08-05 ENCOUNTER — Encounter: Payer: Self-pay | Admitting: Cardiovascular Disease

## 2020-08-05 ENCOUNTER — Encounter
Admission: RE | Disposition: A | Discharge status: Home / Self Care | Payer: Medicaid Other | Source: Home / Self Care | Attending: Cardiovascular Disease

## 2020-08-05 ENCOUNTER — Ambulatory Visit
Admission: RE | Admit: 2020-08-05 | Discharge: 2020-08-05 | Disposition: A | Discharge status: Home / Self Care | Payer: Medicaid Other | Attending: Cardiovascular Disease | Admitting: Cardiovascular Disease

## 2020-08-05 DIAGNOSIS — Z888 Allergy status to other drugs, medicaments and biological substances status: Secondary | ICD-10-CM | POA: Insufficient documentation

## 2020-08-05 DIAGNOSIS — K219 Gastro-esophageal reflux disease without esophagitis: Secondary | ICD-10-CM | POA: Insufficient documentation

## 2020-08-05 DIAGNOSIS — E785 Hyperlipidemia, unspecified: Secondary | ICD-10-CM | POA: Diagnosis not present

## 2020-08-05 DIAGNOSIS — Z794 Long term (current) use of insulin: Secondary | ICD-10-CM | POA: Insufficient documentation

## 2020-08-05 DIAGNOSIS — G4730 Sleep apnea, unspecified: Secondary | ICD-10-CM | POA: Insufficient documentation

## 2020-08-05 DIAGNOSIS — E559 Vitamin D deficiency, unspecified: Secondary | ICD-10-CM | POA: Diagnosis not present

## 2020-08-05 DIAGNOSIS — Z79899 Other long term (current) drug therapy: Secondary | ICD-10-CM | POA: Diagnosis not present

## 2020-08-05 DIAGNOSIS — Z87891 Personal history of nicotine dependence: Secondary | ICD-10-CM | POA: Diagnosis not present

## 2020-08-05 DIAGNOSIS — E669 Obesity, unspecified: Secondary | ICD-10-CM | POA: Insufficient documentation

## 2020-08-05 DIAGNOSIS — Z6834 Body mass index (BMI) 34.0-34.9, adult: Secondary | ICD-10-CM | POA: Insufficient documentation

## 2020-08-05 DIAGNOSIS — Z7982 Long term (current) use of aspirin: Secondary | ICD-10-CM | POA: Diagnosis not present

## 2020-08-05 DIAGNOSIS — I10 Essential (primary) hypertension: Secondary | ICD-10-CM | POA: Diagnosis not present

## 2020-08-05 DIAGNOSIS — I25118 Atherosclerotic heart disease of native coronary artery with other forms of angina pectoris: Secondary | ICD-10-CM | POA: Insufficient documentation

## 2020-08-05 DIAGNOSIS — I208 Other forms of angina pectoris: Secondary | ICD-10-CM

## 2020-08-05 DIAGNOSIS — E1140 Type 2 diabetes mellitus with diabetic neuropathy, unspecified: Secondary | ICD-10-CM | POA: Diagnosis not present

## 2020-08-05 DIAGNOSIS — Z880 Allergy status to penicillin: Secondary | ICD-10-CM | POA: Diagnosis not present

## 2020-08-05 HISTORY — PX: LEFT HEART CATH AND CORONARY ANGIOGRAPHY: CATH118249

## 2020-08-05 LAB — GLUCOSE, CAPILLARY: Glucose-Capillary: 102 mg/dL — ABNORMAL HIGH (ref 70–99)

## 2020-08-05 SURGERY — LEFT HEART CATH AND CORONARY ANGIOGRAPHY
Anesthesia: Moderate Sedation | Laterality: Left

## 2020-08-05 MED ORDER — LIDOCAINE HCL (PF) 1 % IJ SOLN
INTRAMUSCULAR | Status: AC
  Filled 2020-08-05: qty 30

## 2020-08-05 MED ORDER — LIDOCAINE HCL (PF) 1 % IJ SOLN
INTRAMUSCULAR | Status: DC | PRN
  Administered 2020-08-05: 2 mL

## 2020-08-05 MED ORDER — SODIUM CHLORIDE 0.9% FLUSH: 3.0000 mL | INTRAVENOUS | Status: DC | PRN

## 2020-08-05 MED ORDER — SODIUM CHLORIDE 0.9 % IV SOLN: INTRAVENOUS | Status: DC

## 2020-08-05 MED ORDER — VERAPAMIL HCL 2.5 MG/ML IV SOLN
INTRAVENOUS | Status: AC
  Filled 2020-08-05: qty 2

## 2020-08-05 MED ORDER — SODIUM CHLORIDE 0.9 % IV SOLN: 250.0000 mL | INTRAVENOUS | Status: DC | PRN

## 2020-08-05 MED ORDER — ASPIRIN 81 MG PO CHEW
CHEWABLE_TABLET | ORAL | Status: AC
  Filled 2020-08-05: qty 1

## 2020-08-05 MED ORDER — IOHEXOL 300 MG/ML  SOLN
INTRAMUSCULAR | Status: DC | PRN
  Administered 2020-08-05: 50 mL

## 2020-08-05 MED ORDER — SODIUM CHLORIDE 0.9% FLUSH: 3.0000 mL | Freq: Two times a day (BID) | INTRAVENOUS | Status: DC

## 2020-08-05 MED ORDER — MIDAZOLAM HCL 2 MG/2ML IJ SOLN
INTRAMUSCULAR | Status: DC | PRN
  Administered 2020-08-05: 1 mg via INTRAVENOUS

## 2020-08-05 MED ORDER — MIDAZOLAM HCL 2 MG/2ML IJ SOLN
INTRAMUSCULAR | Status: AC
  Filled 2020-08-05: qty 2

## 2020-08-05 MED ORDER — HEPARIN (PORCINE) IN NACL 1000-0.9 UT/500ML-% IV SOLN
INTRAVENOUS | Status: DC | PRN
  Administered 2020-08-05: 500 mL

## 2020-08-05 MED ORDER — SODIUM CHLORIDE 0.9 % IV SOLN
INTRAVENOUS | Status: DC
  Administered 2020-08-05: 1000 mL via INTRAVENOUS

## 2020-08-05 MED ORDER — VERAPAMIL HCL 2.5 MG/ML IV SOLN
INTRAVENOUS | Status: DC | PRN
  Administered 2020-08-05: 2.5 mg via INTRA_ARTERIAL

## 2020-08-05 MED ORDER — HEPARIN (PORCINE) IN NACL 1000-0.9 UT/500ML-% IV SOLN
INTRAVENOUS | Status: AC
  Filled 2020-08-05: qty 1000

## 2020-08-05 MED ORDER — HEPARIN SODIUM (PORCINE) 1000 UNIT/ML IJ SOLN
INTRAMUSCULAR | Status: AC
  Filled 2020-08-05: qty 1

## 2020-08-05 MED ORDER — ONDANSETRON HCL 4 MG/2ML IJ SOLN: 4.0000 mg | Freq: Four times a day (QID) | INTRAMUSCULAR | Status: DC | PRN

## 2020-08-05 MED ORDER — FENTANYL CITRATE (PF) 100 MCG/2ML IJ SOLN
INTRAMUSCULAR | Status: DC | PRN
  Administered 2020-08-05: 50 ug via INTRAVENOUS

## 2020-08-05 MED ORDER — ASPIRIN 81 MG PO CHEW
81.0000 mg | CHEWABLE_TABLET | ORAL | Status: AC
  Administered 2020-08-05: 81 mg via ORAL

## 2020-08-05 MED ORDER — FENTANYL CITRATE (PF) 100 MCG/2ML IJ SOLN
INTRAMUSCULAR | Status: AC
  Filled 2020-08-05: qty 2

## 2020-08-05 MED ORDER — ACETAMINOPHEN 325 MG PO TABS: 650.0000 mg | ORAL_TABLET | ORAL | Status: DC | PRN

## 2020-08-05 MED ORDER — HEPARIN SODIUM (PORCINE) 1000 UNIT/ML IJ SOLN
INTRAMUSCULAR | Status: DC | PRN
  Administered 2020-08-05: 6000 [IU] via INTRAVENOUS

## 2020-08-05 SURGICAL SUPPLY — 8 items
CATH INFINITI 5 FR JL3.5 (CATHETERS) ×3 IMPLANT
CATH INFINITI 5FR JK (CATHETERS) ×3 IMPLANT
DEVICE RAD TR BAND REGULAR (VASCULAR PRODUCTS) ×3 IMPLANT
GLIDESHEATH SLEND SS 6F .021 (SHEATH) ×3 IMPLANT
GUIDEWIRE INQWIRE 1.5J.035X260 (WIRE) ×1 IMPLANT
INQWIRE 1.5J .035X260CM (WIRE) ×3
KIT MANI 3VAL PERCEP (MISCELLANEOUS) ×3 IMPLANT
PACK CARDIAC CATH (CUSTOM PROCEDURE TRAY) ×3 IMPLANT

## 2020-08-05 NOTE — Interval H&P Note (Signed)
Cath Lab Visit (complete for each Cath Lab visit)  Clinical Evaluation Leading to the Procedure:   ACS: No.  Non-ACS:    Anginal Classification: CCS III  Anti-ischemic medical therapy: Maximal Therapy (2 or more classes of medications)  Non-Invasive Test Results: No non-invasive testing performed  Prior CABG: No previous CABG      History and Physical Interval Note:  08/05/2020 9:49 AM  [name] [name]  has presented today for surgery, with the diagnosis of LT Heart Cath poss PCI   Stable angina.  The various methods of treatment have been discussed with the patient and family. After consideration of risks, benefits and other options for treatment, the patient has consented to  Procedure(s): LEFT HEART CATH AND CORONARY ANGIOGRAPHY poss PCI (Left) as a surgical intervention.  The patient's history has been reviewed, patient examined, no change in status, stable for surgery.  I have reviewed the patient's chart and labs.  Questions were answered to the patient's satisfaction.     [name] [name]

## 2020-08-13 NOTE — Progress Notes (Signed)
Name: [name] [name]   MRN: [id number]    DOB: [birth year]   Date:08/14/2020       Progress Note  Subjective  Chief Complaint  Chief Complaint  Patient presents with  . Diabetes    HPI   DMII:A1C was down to 6.2%,6.6%,went up to 6.7%today is down to 6 % He was on Lantus 24 , Metformin twice daily and Farxiga, since last visit 2 months ago he has been on Trulicity, he went down on Lantus gradually and is current at 10 units , still on Farxiga but down on Metformin to once daily. Tolerating medications well. Glucose at home has beenin the range of past 90 days has been 104  Discussed changing medications to more cardiovascular protective drugs and also medications that help with weight loss. Positive for polyphagia, polydipsia or polyuria. He is no longer feeling shaky in the mornings. He is pleased with the weight loss. Only side effect was constipation . Medication curbs his appetite and his weight is down from 262 lbs to 245 lbs and is happy with results . Advised to resume Metformin BID and try to come off Lantus by next visit   Dyspnea with activity: seen by Dr. Fletcher Anon and left cardiac cath is below, no stent was placed and is on medical management only    Mid LAD lesion is 30% stenosed.  The left ventricular systolic function is normal.  LV end diastolic pressure is mildly elevated.  The left ventricular ejection fraction is 55-65% by visual estimate.   1.  Mild nonobstructive one-vessel coronary artery disease.  Mid LAD stenosis does not appear to be any worse since most recent catheterization. 2.  Normal LV systolic function and mildly elevated left ventricular end-diastolic pressure.  Recommendations: Continue medical therapy for stable angina which does not seem to be due to obstructive coronary artery disease.    Patient Active Problem List   Diagnosis Date Noted  . Localized, primary osteoarthritis of hand 09/28/2019  . Seizure ([place]) 02/22/2018  . Mild cognitive  impairment 11/17/2017  . Radiculopathy of cervical region 11/17/2017  . Stenosis of carotid artery 09/08/2017  . Cervical stenosis of spine 09/08/2017  . B12 deficiency 06/28/2017  . Headache disorder 02/15/2017  . Memory loss or impairment 02/15/2017  . Coronary artery disease involving native coronary artery of native heart   . Radiculitis of left cervical region 03/05/2016  . Diabetic neuropathy associated with type 2 diabetes mellitus ([place]) 12/05/2015  . Patellar subluxation 06/05/2015  . Allergic rhinitis, seasonal 06/05/2015  . Chronic constipation 06/05/2015  . Chronic lower back pain 06/05/2015  . Morbid obesity due to excess calories ([place]) 06/05/2015  . Vitamin D deficiency 06/05/2015  . Central sleep apnea 06/05/2015  . 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 Nerve root pain 06/05/2015  . Acquired polycythemia 06/05/2015  . Anterior knee pain 06/05/2015  . Dysmetabolic syndrome 37/29/0211  . Failure of erection 06/05/2015  . Gastro-esophageal reflux disease without esophagitis 06/05/2015  . Neuropathy 06/05/2015  . CAD (coronary artery disease)   . Angina, class III ([place]) 05/23/2015  . Essential hypertension 05/23/2015  . Hyperlipidemia 05/23/2015  . Inguinal hernia without mention of obstruction or gangrene, recurrent unilateral or unspecified 03/24/2013    Past Surgical History:  Procedure Laterality Date  . BACK SURGERY  12/2008   X2-LUMBAR  . CARDIAC CATHETERIZATION N/A 05/30/2015   Procedure: Left Heart Cath;  Surgeon: Wellington Hampshire, MD;  Location: [place] [facility];  Service: Cardiovascular;  Laterality: N/A;  .  CARDIAC CATHETERIZATION N/A 04/16/2016   Procedure: Left Heart Cath and Coronary Angiography;  Surgeon: Wellington Hampshire, MD;  Location: [place] [facility];  Service: Cardiovascular;  Laterality: N/A;  . COLONOSCOPY  2014   Dr. [name]  . COLONOSCOPY WITH PROPOFOL N/A 12/25/2016   Procedure: COLONOSCOPY WITH PROPOFOL;  Surgeon: Jonathon Bellows, MD;  Location:  [facility] ENDOSCOPY;  Service: Endoscopy;  Laterality: N/A;  . COLONOSCOPY WITH PROPOFOL N/A 01/29/2017   Procedure: COLONOSCOPY WITH PROPOFOL;  Surgeon: Jonathon Bellows, MD;  Location: [facility] ENDOSCOPY;  Service: Endoscopy;  Laterality: N/A;  . COLONOSCOPY WITH PROPOFOL N/A 04/11/2020   Procedure: COLONOSCOPY WITH PROPOFOL;  Surgeon: Jonathon Bellows, MD;  Location: [facility] ENDOSCOPY;  Service: Gastroenterology;  Laterality: N/A;  . ESOPHAGOGASTRODUODENOSCOPY (EGD) WITH PROPOFOL N/A 01/26/2019   Procedure: ESOPHAGOGASTRODUODENOSCOPY (EGD) WITH PROPOFOL;  Surgeon: Lin Landsman, MD;  Location: [facility]COPY;  Service: Gastroenterology;  Laterality: N/A;  . EVALUATION UNDER ANESTHESIA WITH HEMORRHOIDECTOMY N/A 02/24/2017   Procedure: EXAM UNDER ANESTHESIA WITH POSSIBLE EXCISION OF INTERNAL HEMORRHOIDS;  Surgeon: Olean Ree, MD;  Location: [facility] ORS;  Service: General;  Laterality: N/A;  . FISSURECTOMY  02/24/2017   Procedure: FISSURECTOMY;  Surgeon: Olean Ree, MD;  Location: [facility] ORS;  Service: General;;  . HAND SURGERY Left 2020  . HERNIA REPAIR Right 1991  . INGUINAL HERNIA REPAIR Right 2012   Dr [name]  . INGUINAL HERNIA REPAIR Right 2014   Dr [name]  . LEFT HEART CATH AND CORONARY ANGIOGRAPHY Left 08/05/2020   Procedure: LEFT HEART CATH AND CORONARY ANGIOGRAPHY poss PCI;  Surgeon: Wellington Hampshire, MD;  Location: [place] [facility];  Service: Cardiovascular;  Laterality: Left;    Family History  Problem Relation Age of Onset  . Heart Problems Mother   . Heart Problems Father 27       myocardial infarction  . Mental illness Brother   . Testicular cancer Paternal Uncle   . Other Maternal Aunt        COVID  . Breast cancer Cousin     Social History   Tobacco Use  . Smoking status: Former Smoker    Years: 2.00    Types: Cigars    Start date: 12/28/2002    Quit date: 06/04/2005    Years since quitting: 15.2  . Smokeless tobacco: Never Used  Substance Use Topics  . Alcohol use: Yes     Alcohol/week: 1.0 standard drink    Types: 1 Glasses of wine per week    Comment: RARE BEER     Current Outpatient Medications:  .  ACCU-CHEK AVIVA PLUS test strip, USE AS DIRECTED TO TEST BLOOD SUGAR THREE TIMES DAILY AND AS NEEDED, Disp: 100 strip, Rfl: 3 .  aspirin 81 MG tablet, Take 81 mg by mouth daily., Disp: , Rfl:  .  atorvastatin (LIPITOR) 80 MG tablet, Take 1 tablet (80 mg total) by mouth daily., Disp: 90 tablet, Rfl: 1 .  blood glucose meter kit and supplies KIT, Dispense based on patient and insurance preference. Use up to four times daily as directed. (FOR ICD-9 250.00, 250.01)., Disp: 1 each, Rfl: 0 .  carvedilol (COREG) 12.5 MG tablet, Take 1 tablet (12.5 mg total) by mouth 2 (two) times daily., Disp: 180 tablet, Rfl: 3 .  cetirizine (ZYRTEC) 10 MG tablet, Take 10 mg by mouth daily as needed for allergies. , Disp: , Rfl: 1 .  dapagliflozin propanediol (FARXIGA) 10 MG TABS tablet, Take 1 tablet (10 mg total) by mouth daily before  breakfast., Disp: 90 tablet, Rfl: 0 .  dicyclomine (BENTYL) 10 MG capsule, TAKE 1 CAPSULE BY MOUTH THREE TIMES A DAY BEFORE MEALS (Patient taking differently: Take 10 mg by mouth 3 (three) times daily before meals. ), Disp: 90 capsule, Rfl: 0 .  ezetimibe (ZETIA) 10 MG tablet, TAKE 1 TABLET(10 MG) BY MOUTH DAILY (Patient taking differently: Take 10 mg by mouth daily. ), Disp: 90 tablet, Rfl: 1 .  fluticasone (FLONASE) 50 MCG/ACT nasal spray, Place 2 sprays into the nose daily as needed for allergies or rhinitis. , Disp: , Rfl:  .  ibuprofen (ADVIL) 800 MG tablet, Take 800 mg by mouth daily as needed for mild pain or moderate pain. , Disp: , Rfl:  .  insulin glargine (LANTUS SOLOSTAR) 100 UNIT/ML Solostar Pen, INJECT 28 UNITS INTO THE SKIN DAILY (Patient taking differently: Inject 14 Units into the skin daily. ), Disp: 10 mL, Rfl: 0 .  Insulin Pen Needle (BD PEN NEEDLE NANO 2ND GEN) 32G X 4 MM MISC, USE AS DIRECTED, Disp: 100 each, Rfl: 12 .   losartan-hydrochlorothiazide (HYZAAR) 50-12.5 MG tablet, Take 1 tablet by mouth daily., Disp: 90 tablet, Rfl: 0 .  lubiprostone (AMITIZA) 24 MCG capsule, TAKE 1 CAPSULE(24 MCG) BY MOUTH TWICE DAILY WITH A MEAL (Patient taking differently: Take 24 mcg by mouth 2 (two) times daily with a meal. ), Disp: 60 capsule, Rfl: 0 .  metFORMIN (GLUCOPHAGE) 500 MG tablet, TAKE 1 TABLET(500 MG) BY MOUTH TWICE DAILY WITH A MEAL (Patient taking differently: Take 500 mg by mouth daily with breakfast. ), Disp: 180 tablet, Rfl: 0 .  NITROSTAT 0.4 MG SL tablet, place 1 tablet under the tongue every 5 minutes for UP TO 3 doses if needed for angina as directed by prescriber (Patient taking differently: Place 0.4 mg under the tongue every 5 (five) minutes as needed for chest pain. ), Disp: 25 tablet, Rfl: 1 .  ranolazine (RANEXA) 1000 MG SR tablet, TAKE 1 TABLET(1000 MG) BY MOUTH TWICE DAILY (Patient taking differently: Take 1,000 mg by mouth 2 (two) times daily. ), Disp: 180 tablet, Rfl: 3 .  selenium sulfide (SELSUN) 2.5 % shampoo, Apply 1 application topically daily as needed for irritation. (Patient taking differently: Apply 1 application topically daily. ), Disp: 118 mL, Rfl: 12 .  TRULICITY 1.5 GY/6.5LD SOPN, ADMINISTER 1.5 MG UNDER THE SKIN 1 TIME A WEEK, Disp: 2 mL, Rfl: 1  Allergies  Allergen Reactions  . Gabapentin Other (See Comments)    Groggy-Mood Changes  . Latex Rash  . Penicillins Hives and Rash    Has patient had a PCN reaction causing immediate rash, facial/tongue/throat swelling, SOB or lightheadedness with hypotension: Yes Has patient had a PCN reaction causing severe rash involving mucus membranes or skin necrosis: No Has patient had a PCN reaction that required hospitalization No Has patient had a PCN reaction occurring within the last 10 years: No If all of the above answers are "NO", then may proceed with Cephalosporin use.     I personally reviewed active problem list, medication list,  allergies, family history, social history, health maintenance with the patient/caregiver today.   Constitutional: Negative for fever, positive for  weight change.  Respiratory: Negative for cough and shortness of breath.   Cardiovascular: Negative for chest pain or palpitations.  Gastrointestinal: Negative for abdominal pain, no bowel changes.  Musculoskeletal: Negative for gait problem or joint swelling.  Skin: Negative for rash.  Neurological: Negative for dizziness or headache.  No other specific  complaints in a complete review of systems (except as listed in HPI above).   Objective  Vitals:   08/14/20 0751  BP: 140/80  Pulse: 88  Resp: 16  Temp: 98.1 F (36.7 C)  TempSrc: Oral  SpO2: 99%  Weight: 245 lb 12.8 oz (111.5 kg)  Height: '5\' 11"'  (1.803 m)    Body mass index is 34.28 kg/m.  Physical Exam  Constitutional: Patient appears well-developed and well-nourished. Obese No distress.  HEENT: head atraumatic, normocephalic, pupils equal and reactive to light,  neck supple, throat within normal limits Cardiovascular: Normal rate, regular rhythm and normal heart sounds.  No murmur heard. No BLE edema. Pulmonary/Chest: Effort normal and breath sounds normal. No respiratory distress. Abdominal: Soft.  There is no tenderness. Psychiatric: Patient has a normal mood and affect. behavior is normal. Judgment and thought content normal.   Recent Results (from the past 2160 hour(s))  POCT HgB A1C     Status: Abnormal   Collection Time: 06/07/20  8:17 AM  Result Value Ref Range   Hemoglobin A1C 6.0 (A) 4.0 - 5.6 %   HbA1c POC (<> result, manual entry)     HbA1c, POC (prediabetic range)     HbA1c, POC (controlled diabetic range)    Basic metabolic panel     Status: Abnormal   Collection Time: 07/25/20 10:57 AM  Result Value Ref Range   Glucose 123 (H) 65 - 99 mg/dL   BUN 15 6 - 24 mg/dL   Creatinine, Ser 1.12 0.76 - 1.27 mg/dL   GFR calc non Af Amer 73 >59 mL/min/1.73   GFR  calc Af Amer 84 >59 mL/min/1.73    Comment: **[facility] currently reports eGFR in compliance with the current**   recommendations of the [employer]. [facility] will   update reporting as new guidelines are published from the NKF-ASN   Task force.    BUN/Creatinine Ratio 13 9 - 20   Sodium 137 134 - 144 mmol/L   Potassium 3.8 3.5 - 5.2 mmol/L   Chloride 99 96 - 106 mmol/L   CO2 22 20 - 29 mmol/L   Calcium 9.5 8.7 - 10.2 mg/dL  CBC w/Diff     Status: Abnormal   Collection Time: 07/25/20 10:57 AM  Result Value Ref Range   WBC 8.0 3.4 - 10.8 x10E3/uL   RBC 4.98 4.14 - 5.80 x10E6/uL   Hemoglobin 16.8 13.0 - 17.7 g/dL   Hematocrit 46.7 37.5 - 51.0 %   MCV 94 79 - 97 fL   MCH 33.7 (H) 26.6 - 33.0 pg   MCHC 36.0 (H) 31 - 35 g/dL   RDW 13.0 11.6 - 15.4 %   Platelets 238 150 - 450 x10E3/uL   Neutrophils 47 Not Estab. %   Lymphs 41 Not Estab. %   Monocytes 9 Not Estab. %   Eos 1 Not Estab. %   Basos 1 Not Estab. %   Neutrophils Absolute 3.7 1 - 7 x10E3/uL   Lymphocytes Absolute 3.3 (H) 0 - 3 x10E3/uL   Monocytes Absolute 0.7 0 - 0 x10E3/uL   EOS (ABSOLUTE) 0.1 0.0 - 0.4 x10E3/uL   Basophils Absolute 0.1 0 - 0 x10E3/uL   Immature Granulocytes 1 Not Estab. %   Immature Grans (Abs) 0.1 0.0 - 0.1 x10E3/uL  SARS CORONAVIRUS 2 (TAT 6-24 HRS) Nasopharyngeal Nasopharyngeal Swab     Status: None   Collection Time: 08/02/20 10:19 AM   Specimen: Nasopharyngeal Swab  Result Value Ref Range  SARS Coronavirus 2 NEGATIVE NEGATIVE    Comment: (NOTE) SARS-CoV-2 target nucleic acids are NOT DETECTED.  The SARS-CoV-2 RNA is generally detectable in upper and lower respiratory specimens during the acute phase of infection. Negative results do not preclude SARS-CoV-2 infection, do not rule out co-infections with other pathogens, and should not be used as the sole basis for treatment or other patient management decisions. Negative results must be combined with clinical  observations, patient history, and epidemiological information. The expected result is Negative.  Fact Sheet for Patients: SugarRoll.be  Fact Sheet for Healthcare Providers: https://www.woods-mathews.com/  This test is not yet approved or cleared by the [place] FDA and  has been authorized for detection and/or diagnosis of SARS-CoV-2 by FDA under an Emergency Use Authorization (EUA). This EUA will remain  in effect (meaning this test can be used) for the duration of the COVID-19 declaration under Se ction 564(b)(1) of the Act, 21 U.S.C. section 360bbb-3(b)(1), unless the authorization is terminated or revoked sooner.  Performed at [facility], [place] [address], [place], [place] [address]   Glucose, capillary     Status: Abnormal   Collection Time: 08/05/20  9:12 AM  Result Value Ref Range   Glucose-Capillary 102 (H) 70 - 99 mg/dL    Comment: Glucose reference range applies only to samples taken after fasting for at least 8 hours.     PHQ2/9: Depression screen [facility] 2/9 08/14/2020 06/07/2020 01/29/2020 09/28/2019 06/12/2019  Decreased Interest 0 0 0 0 0  Down, Depressed, Hopeless 0 0 0 0 0  PHQ - 2 Score 0 0 0 0 0  Altered sleeping 0 0 0 2 0  Tired, decreased energy 0 0 0 3 0  Change in appetite 0 0 0 1 0  Feeling bad or failure about yourself  0 0 0 0 0  Trouble concentrating 0 0 0 0 0  Moving slowly or fidgety/restless 0 0 0 0 0  Suicidal thoughts 0 0 0 0 0  PHQ-9 Score 0 0 0 6 0  Difficult doing work/chores - - Not difficult at all Somewhat difficult Not difficult at all  Some recent data might be hidden    phq 9 is negative   Fall Risk: Fall Risk  08/14/2020 06/07/2020 01/29/2020 09/28/2019 06/12/2019  Falls in the past year? 0 1 0 0 1  Number falls in past yr: 0 1 0 0 1  Injury with Fall? 0 1 0 0 1  Comment - - - - Left Thumb  Follow up - - - - -    Functional Status Survey: Is the patient deaf or have difficulty  hearing?: Yes Does the patient have difficulty seeing, even when wearing glasses/contacts?: Yes Does the patient have difficulty concentrating, remembering, or making decisions?: Yes Does the patient have difficulty walking or climbing stairs?: Yes Does the patient have difficulty dressing or bathing?: No Does the patient have difficulty doing errands alone such as visiting a doctor's office or shopping?: No    Assessment & Plan   1. Obesity   Discussed with the patient the risk posed by an increased BMI. Discussed importance of portion control, calorie counting and at least 150 minutes of physical activity weekly. Avoid sweet beverages and drink more water. Eat at least 6 servings of fruit and vegetables daily   2. Controlled type 2 diabetes mellitus with microalbuminuria, without long-term current use of insulin (HCC)  Try to wean off Lantus  Continue Trulicity and Farxiga Go back up  on Metformin to be able to come off Lantus

## 2020-08-14 ENCOUNTER — Encounter: Payer: Self-pay | Admitting: Family Medicine

## 2020-08-14 ENCOUNTER — Ambulatory Visit: Payer: Medicaid Other | Admitting: Family Medicine

## 2020-08-14 ENCOUNTER — Other Ambulatory Visit: Payer: Self-pay

## 2020-08-14 VITALS — BP 140/80 | HR 88 | Temp 98.1°F | Resp 16 | Ht 71.0 in | Wt 245.8 lb

## 2020-08-14 DIAGNOSIS — E669 Obesity, unspecified: Secondary | ICD-10-CM

## 2020-08-14 DIAGNOSIS — R809 Proteinuria, unspecified: Secondary | ICD-10-CM | POA: Diagnosis not present

## 2020-08-14 DIAGNOSIS — E1129 Type 2 diabetes mellitus with other diabetic kidney complication: Secondary | ICD-10-CM | POA: Diagnosis not present

## 2020-08-22 ENCOUNTER — Other Ambulatory Visit: Payer: Self-pay

## 2020-08-22 ENCOUNTER — Ambulatory Visit: Payer: Medicaid Other | Admitting: Nurse Practitioner

## 2020-08-22 ENCOUNTER — Encounter: Payer: Self-pay | Admitting: Nurse Practitioner

## 2020-08-22 VITALS — BP 100/70 | HR 72 | Ht 71.0 in | Wt 247.0 lb

## 2020-08-22 DIAGNOSIS — E782 Mixed hyperlipidemia: Secondary | ICD-10-CM | POA: Diagnosis not present

## 2020-08-22 DIAGNOSIS — I208 Other forms of angina pectoris: Secondary | ICD-10-CM | POA: Diagnosis not present

## 2020-08-22 DIAGNOSIS — I25118 Atherosclerotic heart disease of native coronary artery with other forms of angina pectoris: Secondary | ICD-10-CM | POA: Diagnosis not present

## 2020-08-22 DIAGNOSIS — I10 Essential (primary) hypertension: Secondary | ICD-10-CM

## 2020-08-22 MED ORDER — ISOSORBIDE MONONITRATE ER 30 MG PO TB24
15.0000 mg | ORAL_TABLET | Freq: Every day | ORAL | 3 refills | Status: DC
Start: 2020-08-22 — End: 2020-12-19

## 2020-08-22 MED ORDER — CARVEDILOL 12.5 MG PO TABS: 6.2500 mg | ORAL_TABLET | Freq: Two times a day (BID) | ORAL | 3 refills | Status: DC

## 2020-08-22 NOTE — Patient Instructions (Signed)
Medication Instructions:  1- DECREASE Coreg to 0.5 tablet (6.25 mg total) twice daily 2- START Imdur 0.5 tablet (15 mg total) once daily  *If you need a refill on your cardiac medications before your next appointment, please call your pharmacy*   Lab Work: None ordered If you have labs (blood work) drawn today and your tests are completely normal, you will receive your results only by: Marland Kitchen MyChart Message (if you have MyChart) OR . A paper copy in the mail If you have any lab test that is abnormal or we need to change your treatment, we will call you to review the results.   Testing/Procedures: None ordered   Follow-Up: At [facility], you and your health needs are our priority.  As part of our continuing mission to provide you with exceptional heart care, we have created designated Provider Care Teams.  These Care Teams include your primary Cardiologist (physician) and Advanced Practice Providers (APPs -  Physician Assistants and Nurse Practitioners) who all work together to provide you with the care you need, when you need it.  We recommend signing up for the patient portal called "MyChart".  Sign up information is provided on this After Visit Summary.  MyChart is used to connect with patients for Virtual Visits (Telemedicine).  Patients are able to view lab/test results, encounter notes, upcoming appointments, etc.  Non-urgent messages can be sent to your provider as well.   To learn more about what you can do with MyChart, go to [email or web address].    Your next appointment:   4-6 week(s)  The format for your next appointment:   In Person  Provider:    You may see [name], MD or Murray Hodgkins, NP

## 2020-08-22 NOTE — Progress Notes (Signed)
Office Visit    Patient Name: [name] [name] Date of Encounter: 08/22/2020  Primary Care Provider:  Steele Sizer, MD Primary Cardiologist:  [name], MD  Chief Complaint    59 year old male with a history of nonobstructive CAD, stable angina with suspected endothelial dysfunction, diabetes, hypertension, hyperlipidemia, obesity, sleep apnea, diastolic dysfunction, and GERD, who presents for follow-up after recent catheterization for stable angina.  Past Medical History    Past Medical History:  Diagnosis Date  . Allergy   . Angina, class III ([place])   . Chronic constipation   . Degeneration of intervertebral disc of cervical region   . Diabetes mellitus without complication ([place])   . Diastolic dysfunction    a. 06/2017 Echo: EF 60-65%, no rwma, Gr1 DD.  Marland Kitchen Dyspnea   . GERD (gastroesophageal reflux disease)   . Hernia 1991   02/10/2012-RIH repair  . Hyperlipidemia   . Hypertension 2008  . Nerve root pain   . Neuropathy   . Non-obstructive CAD (coronary artery disease)    a. 05/30/2015 cath: LM nl, mLAD 50% (FFR 0.83), LCx minor irregs, RCA minor irregs, EF 55-65%-->Med Rx; b. 03/2016 Cath: LM nl LAD 70m D1/2/3 min irregs, LCX min irregs, OM1/2/3 nl, RCA min irregs, RPDA/RPAV/RPL1/RPL2 nl-->Med Rx; b. 07/2020 Cath: LM nl, LAD 390mD1/2/3 min irregs, LCX min irregs, RCA min irregs, RPDA/RPAV/RPL1,2 nl, EF 55-65%.  . Obesity, unspecified 2012  . Personal history of tobacco use, presenting hazards to health 2012  . Polycythemia   . Rectal bleeding 02/11/2017  . Recurrent Right Inguinal Hernia Repair 02/09/2011[facility]   [facility] Sleep apnea    a. On CPAP;  b. 06/2017 Echo: no PAH.  . Marland Kitchenmbilical hernia without mention of obstruction or gangrene 02/09/2011   [facility] D deficiency    Past Surgical History:  Procedure Laterality Date  . BACK SURGERY  12/2008   X2-LUMBAR  . CARDIAC CATHETERIZATION N/A 05/30/2015   Procedure: Left Heart Cath;   Surgeon: MuWellington HampshireMD;  [place];  Service: Cardiovascular;  Laterality: N/A;  . CARDIAC CATHETERIZATION N/A 04/16/2016   Procedure: Left Heart Cath and Coronary Angiography;  Surgeon: MuWellington HampshireMD;  [place] [facility];  Service: Cardiovascular;  Laterality: N/A;  . COLONOSCOPY  2014   Dr. SaJamal Collin. COLONOSCOPY WITH PROPOFOL N/A 12/25/2016   Procedure: COLONOSCOPY WITH PROPOFOL;  Surgeon: KiJonathon BellowsMD;  Location: [facility];  Service: Endoscopy;  Laterality: N/A;  . COLONOSCOPY WITH PROPOFOL N/A 01/29/2017   Procedure: COLONOSCOPY WITH PROPOFOL;  Surgeon: KiJonathon BellowsMD;  Location: [facility];  Service: Endoscopy;  Laterality: N/A;  . COLONOSCOPY WITH PROPOFOL N/A 04/11/2020   Procedure: COLONOSCOPY WITH PROPOFOL;  Surgeon: AnJonathon BellowsMD;  [facility];  Service: Gastroenterology;  Laterality: N/A;  . ESOPHAGOGASTRODUODENOSCOPY (EGD) WITH PROPOFOL N/A 01/26/2019   Procedure: ESOPHAGOGASTRODUODENOSCOPY (EGD) WITH PROPOFOL;  Surgeon: VaLin LandsmanMD;  [facility];  Service: Gastroenterology;  Laterality: N/A;  . EVALUATION UNDER ANESTHESIA WITH HEMORRHOIDECTOMY N/A 02/24/2017   Procedure: EXAM UNDER ANESTHESIA WITH POSSIBLE EXCISION OF INTERNAL HEMORRHOIDS;  Surgeon: JoOlean ReeMD;  Location: [facility] OR[facility];  Service: General;  Laterality: N/A;  . FISSURECTOMY  02/24/2017   Procedure: FISSURECTOMY;  Surgeon: JoOlean ReeMD;  Location: [facility];  Service: General;;  . HAND SURGERY Left 2020  . HERNIA REPAIR Right 1991  . INGUINAL HERNIA REPAIR Right 2012  Dr [name]  . INGUINAL HERNIA REPAIR Right 2014   Dr [name]  . LEFT HEART CATH AND CORONARY ANGIOGRAPHY Left 08/05/2020   Procedure: LEFT HEART CATH AND CORONARY ANGIOGRAPHY poss PCI;  Surgeon: Wellington Hampshire, MD;  Location: [place] [facility] LAB;  Service: Cardiovascular;  Laterality: Left;    Allergies  Allergies  Allergen Reactions  . Gabapentin  Other (See Comments)    Groggy-Mood Changes  . Latex Rash  . Penicillins Hives and Rash    Has patient had a PCN reaction causing immediate rash, facial/tongue/throat swelling, SOB or lightheadedness with hypotension: Yes Has patient had a PCN reaction causing severe rash involving mucus membranes or skin necrosis: No Has patient had a PCN reaction that required hospitalization No Has patient had a PCN reaction occurring within the last 10 years: No If all of the above answers are "NO", then may proceed with Cephalosporin use.     History of Present Illness    59 year old male with a history of nonobstructive CAD, stable angina with suspected endothelial dysfunction, diabetes, hypertension, hyperlipidemia, obesity, sleep apnea, diastolic dysfunction, and GERD. He previously underwent diagnostic catheterization in 2016 showing moderate mid LAD stenosis (50%) with an FFR of 0.83. EF was normal. He had worsening angina April 2017 with repeat catheterization showing improvement in mid LAD stenosis to 30% and no evidence of obstructive disease. In 2018, he had a syncopal episode. Echo showed normal LV function. Outpatient monitoring showed no significant arrhythmia and episodes were felt to be noncardiac in origin. He was evaluated by neurology however no clear etiology was identified. He was last seen in cardiology clinic on July 29, at which time he reported significant exertional chest pain and tightness despite antianginal therapy including Ranexa. As result, he underwent diagnostic catheterization on August 9 which showed stable, 30% mid LAD disease with otherwise minor irregularities and normal LV function. Continued medical therapy was recommended.  Since his catheterization, he has continued to experience dyspnea on exertion and mild chest pressure after walking about half a mile.  He tries to walk 4 times around his track but after 2 times, he says he is just too tired to continue.  He denies  palpitations, PND, orthopnea, dizziness, syncope, edema, or early satiety.  Home Medications    Prior to Admission medications   Medication Sig Start Date End Date Taking? Authorizing Provider  ACCU-CHEK AVIVA PLUS test strip USE AS DIRECTED TO TEST BLOOD SUGAR THREE TIMES DAILY AND AS NEEDED 02/25/20   Steele Sizer, MD  aspirin 81 MG tablet Take 81 mg by mouth daily.    [provider]  atorvastatin (LIPITOR) 80 MG tablet Take 1 tablet (80 mg total) by mouth daily. 01/29/20   Steele Sizer, MD  blood glucose meter kit and supplies KIT Dispense based on patient and insurance preference. Use up to four times daily as directed. (FOR ICD-9 250.00, 250.01). 01/27/19   Gouru, Aruna, MD  carvedilol (COREG) 12.5 MG tablet Take 1 tablet (12.5 mg total) by mouth 2 (two) times daily. 10/19/19   Wellington Hampshire, MD  cetirizine (ZYRTEC) 10 MG tablet Take 10 mg by mouth daily as needed for allergies.  07/09/16   [provider]  dapagliflozin propanediol (FARXIGA) 10 MG TABS tablet Take 1 tablet (10 mg total) by mouth daily before breakfast. 06/07/20   Ancil Boozer, Drue Stager, MD  dicyclomine (BENTYL) 10 MG capsule TAKE 1 CAPSULE BY MOUTH THREE TIMES A DAY BEFORE MEALS Patient taking differently: Take 10 mg by  mouth 3 (three) times daily before meals.  04/27/18   Jonathon Bellows, MD  ezetimibe (ZETIA) 10 MG tablet TAKE 1 TABLET(10 MG) BY MOUTH DAILY Patient taking differently: Take 10 mg by mouth daily.  05/06/20   Steele Sizer, MD  fluticasone (FLONASE) 50 MCG/ACT nasal spray Place 2 sprays into the nose daily as needed for allergies or rhinitis.  03/05/15   [provider]  ibuprofen (ADVIL) 800 MG tablet Take 800 mg by mouth daily as needed for mild pain or moderate pain.  02/20/20   [provider]  insulin glargine (LANTUS SOLOSTAR) 100 UNIT/ML Solostar Pen INJECT 28 UNITS INTO THE SKIN DAILY Patient taking differently: Inject 14 Units into the skin daily.  06/07/20   Steele Sizer, MD  Insulin Pen Needle (BD PEN NEEDLE NANO 2ND GEN) 32G X 4 MM MISC USE AS DIRECTED 06/12/20   Ancil Boozer, Drue Stager, MD  losartan-hydrochlorothiazide (HYZAAR) 50-12.5 MG tablet Take 1 tablet by mouth daily. 05/07/20   Wellington Hampshire, MD  lubiprostone (AMITIZA) 24 MCG capsule TAKE 1 CAPSULE(24 MCG) BY MOUTH TWICE DAILY WITH A MEAL Patient taking differently: Take 24 mcg by mouth 2 (two) times daily with a meal.  05/06/20   Sowles, Drue Stager, MD  metFORMIN (GLUCOPHAGE) 500 MG tablet TAKE 1 TABLET(500 MG) BY MOUTH TWICE DAILY WITH A MEAL Patient taking differently: Take 500 mg by mouth daily with breakfast.  01/20/20   Sowles, Drue Stager, MD  NITROSTAT 0.4 MG SL tablet place 1 tablet under the tongue every 5 minutes for UP TO 3 doses if needed for angina as directed by prescriber Patient taking differently: Place 0.4 mg under the tongue every 5 (five) minutes as needed for chest pain.  01/15/17   Wellington Hampshire, MD  ranolazine (RANEXA) 1000 MG SR tablet TAKE 1 TABLET(1000 MG) BY MOUTH TWICE DAILY Patient taking differently: Take 1,000 mg by mouth 2 (two) times daily.  10/19/19   Wellington Hampshire, MD  selenium sulfide (SELSUN) 2.5 % shampoo Apply 1 application topically daily as needed for irritation. Patient taking differently: Apply 1 application topically daily.  03/27/20   Steele Sizer, MD  TRULICITY 1.5 JO/8.3GP SOPN ADMINISTER 1.5 MG UNDER THE SKIN 1 TIME A WEEK 08/01/20   Steele Sizer, MD    Review of Systems    Ongoing exertional chest pressure and dyspnea.  He denies palpitations, PND, orthopnea, dizziness, syncope, edema, or early satiety.  All other systems reviewed and are otherwise negative except as noted above.  Physical Exam    VS:  BP 100/70 (BP Location: Left Arm, Patient Position: Sitting, Cuff Size: Large)   Pulse 72   Ht '5\' 11"'  (1.803 m)   Wt 247 lb (112 kg)   SpO2 98%   BMI 34.45 kg/m  , BMI Body mass index is 34.45 kg/m. GEN: Well nourished, well developed, in no  acute distress. HEENT: normal. Neck: Supple, no JVD, carotid bruits, or masses. Cardiac: RRR, no murmurs, rubs, or gallops. No clubbing, cyanosis, edema.  Radials/PT 2+ and equal bilaterally.  Right radial catheterization site without bleeding, bruit, or hematoma. Respiratory:  Respirations regular and unlabored, clear to auscultation bilaterally. GI: Soft, nontender, nondistended, BS + x 4. MS: no deformity or atrophy. Skin: warm and dry, no rash. Neuro:  Strength and sensation are intact. Psych: Normal affect.  Accessory Clinical Findings    ECG personally reviewed by me today -regular sinus rhythm, 72, nonspecific T changes- no acute changes.  Lab Results  Component Value  Date   WBC 8.0 07/25/2020   HGB 16.8 07/25/2020   HCT 46.7 07/25/2020   MCV 94 07/25/2020   PLT 238 07/25/2020   Lab Results  Component Value Date   CREATININE 1.12 07/25/2020   BUN 15 07/25/2020   NA 137 07/25/2020   K 3.8 07/25/2020   CL 99 07/25/2020   CO2 22 07/25/2020   Lab Results  Component Value Date   ALT 31 09/28/2019   AST 24 09/28/2019   ALKPHOS 62 01/25/2019   BILITOT 0.5 09/28/2019   Lab Results  Component Value Date   CHOL 133 09/28/2019   HDL 42 09/28/2019   LDLCALC 68 09/28/2019   TRIG 146 09/28/2019   CHOLHDL 3.2 09/28/2019    Lab Results  Component Value Date   HGBA1C 6.0 (A) 06/07/2020    Assessment & Plan    1.  Nonobstructive CAD with stable angina: Question endothelial dysfunction in the setting of ongoing exertional dyspnea and chest pressure after walking about a half a mile.  Recent catheterization with minimal, nonobstructive disease (LAD 30%).  His blood pressure is soft today at 100/70.  In discussing options for management, we decided to reduce his carvedilol dose to 6.25 mg twice daily in order to make room for Imdur 15 mg daily.  He remains on Ranexa therapy as well.  He was previously on Imdur and upon review of notes, he tolerated this fine with improvement  in symptoms.  It appears it was likely stopped in early 2020 after experiencing recurrent episodes of loss of consciousness of unclear etiology.  He will monitor for symptoms of orthostasis.  2.  Essential hypertension: Blood pressure soft today.  Reducing beta-blocker therapy in order to make room for long-acting nitrate.  He remains on ARB therapy in the setting of history of creatinine elevation.  3.  Hyperlipidemia: LDL of 68 last October.  He remains on atorvastatin and Zetia therapy.  4.  Type 2 diabetes mellitus: Followed closely by his primary care provider with improvement in A1c to 6.0.  5.  Disposition: Follow-up in approximately 1 month to reevaluate symptoms.  Murray Hodgkins, NP 08/22/2020, 9:12 AM

## 2020-09-06 ENCOUNTER — Other Ambulatory Visit: Payer: Self-pay | Admitting: Family Medicine

## 2020-09-06 DIAGNOSIS — E1140 Type 2 diabetes mellitus with diabetic neuropathy, unspecified: Secondary | ICD-10-CM

## 2020-09-06 DIAGNOSIS — R809 Proteinuria, unspecified: Secondary | ICD-10-CM

## 2020-09-11 ENCOUNTER — Ambulatory Visit: Payer: Medicaid Other | Admitting: Family Medicine

## 2020-09-25 NOTE — Progress Notes (Signed)
Name: [name] [name]   MRN: [id number]    DOB: 1961-04-20   Date:09/26/2020       Progress Note  Subjective  Chief Complaint  Chief Complaint  Patient presents with  . Follow-up  . Diabetes  . Obesity  . Hypertension    HPI  DMII:A1C was down to 6.2%,6.6%,went up to 6.7%,  6 % and now is 5.3% He is off Lantus and trajenta , still on metformin, Truliicty and we added  Farxiga, no low sugars that he has been feeling tired, glucose in the 90-100 He has changed his diet, avoiding Gatorade, only drinking water, advised to stop metformin and if still low on his next visit we will decrease dose of Trulicity. No polyphagia, polydipsia or polyuria. He feels a little shaky in am's. He denies personal history of pancreatitis or family history of MEN  Constipation:doing well on Amitiza , Bristol scale of 2-4 , daily, has follow up with Dr. Marius Ditch coming up soon.   HTN/Angina : taking medication,bp when he first arrived was high but normalized with rest , he denies chest pain or palpitation at this time. BP at home has been 120's/80's.No side effects of medication . Angina is controlled with medication, recently seen by cardiologist and bp was low, they adjusted the coreg dose to 6.25 mg bid, bp slightly elevated today but still at goal   Hyperlipidemia: compliant with medication,reviewed last labs , LDL at goal it was 68, at goal , we will recheck labs today   Seizure: no recent episodes,he gets dizzy occasionally , but has been able to drive short distances.[facility] but not recently, unchanged   OSA: he is wearing CPAPat least 6 times a week, he wears all night , sleeping well now   Rash: recurrent, spots are on his back, arms and legs, not itchy, topical medication is not working, would like to go back to Dr. Nehemiah Massed. The spots are darker than usual now , gets red when he is sweaty  Sciatica: used to see Dr. Phyllis Ginger for neck problems, now having right lower  back pain, that radiates to posterior thigh, advised to try lyrica or gabapentin, avoid steroids, he would like to see Dr. Phyllis Ginger again. No bowel or bladder incontinence  Obesity: BMI is below 35 now Hehas increased physical activity, three times a week walking at the mall. Avoiding sweet beverages, taking Trulicity, smaller portion size, and healthier snacks.   Patient Active Problem List   Diagnosis Date Noted  . Localized, primary osteoarthritis of hand 09/28/2019  . Seizure ([place]) 02/22/2018  . Mild cognitive impairment 11/17/2017  . Radiculopathy of cervical region 11/17/2017  . Stenosis of carotid artery 09/08/2017  . Cervical stenosis of spine 09/08/2017  . B12 deficiency 06/28/2017  . Headache disorder 02/15/2017  . Memory loss or impairment 02/15/2017  . Coronary artery disease involving native coronary artery of native heart   . Radiculitis of left cervical region 03/05/2016  . Diabetic neuropathy associated with type 2 diabetes mellitus ([place]) 12/05/2015  . Patellar subluxation 06/05/2015  . Allergic rhinitis, seasonal 06/05/2015  . Chronic constipation 06/05/2015  . Chronic lower back pain 06/05/2015  . Morbid obesity due to excess calories ([place]) 06/05/2015  . Vitamin D deficiency 06/05/2015  . Central sleep apnea 06/05/2015  . 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 Nerve root pain 06/05/2015  . Acquired polycythemia 06/05/2015  . Anterior knee pain 06/05/2015  . Dysmetabolic syndrome 57/97/2820  . Failure of erection 06/05/2015  .  Gastro-esophageal reflux disease without esophagitis 06/05/2015  . Neuropathy 06/05/2015  . CAD (coronary artery disease)   . Angina, class III ([place]) 05/23/2015  . Essential hypertension 05/23/2015  . Hyperlipidemia 05/23/2015  . Inguinal hernia without mention of obstruction or gangrene, recurrent unilateral or unspecified 03/24/2013    Past Surgical History:  Procedure Laterality Date  . BACK SURGERY  12/2008   X2-LUMBAR  . CARDIAC  CATHETERIZATION N/A 05/30/2015   Procedure: Left Heart Cath;  Surgeon: Wellington Hampshire, MD;  Location: [facility];  Service: Cardiovascular;  Laterality: N/A;  . CARDIAC CATHETERIZATION N/A 04/16/2016   Procedure: Left Heart Cath and Coronary Angiography;  Surgeon: Wellington Hampshire, MD;  Location: [facility];  Service: Cardiovascular;  Laterality: N/A;  . COLONOSCOPY  2014   Dr. [name]  . COLONOSCOPY WITH PROPOFOL N/A 12/25/2016   Procedure: COLONOSCOPY WITH PROPOFOL;  Surgeon: Jonathon Bellows, MD;  Location: [facility] ENDOSCOPY;  Service: Endoscopy;  Laterality: N/A;  . COLONOSCOPY WITH PROPOFOL N/A 01/29/2017   Procedure: COLONOSCOPY WITH PROPOFOL;  Surgeon: Jonathon Bellows, MD;  Location: [facility] ENDOSCOPY;  Service: Endoscopy;  Laterality: N/A;  . COLONOSCOPY WITH PROPOFOL N/A 04/11/2020   Procedure: COLONOSCOPY WITH PROPOFOL;  Surgeon: Jonathon Bellows, MD;  Location: [facility] ENDOSCOPY;  Service: Gastroenterology;  Laterality: N/A;  . ESOPHAGOGASTRODUODENOSCOPY (EGD) WITH PROPOFOL N/A 01/26/2019   Procedure: ESOPHAGOGASTRODUODENOSCOPY (EGD) WITH PROPOFOL;  Surgeon: Lin Landsman, MD;  Location: [facility] ENDOSCOPY;  Service: Gastroenterology;  Laterality: N/A;  . EVALUATION UNDER ANESTHESIA WITH HEMORRHOIDECTOMY N/A 02/24/2017   Procedure: EXAM UNDER ANESTHESIA WITH POSSIBLE EXCISION OF INTERNAL HEMORRHOIDS;  Surgeon: Olean Ree, MD;  Location: [facility] ORS;  Service: General;  Laterality: N/A;  . FISSURECTOMY  02/24/2017   Procedure: FISSURECTOMY;  Surgeon: Olean Ree, MD;  Location: [facility] ORS;  Service: General;;  . HAND SURGERY Left 2020  . HERNIA REPAIR Right 1991  . INGUINAL HERNIA REPAIR Right 2012   Dr [name]  . INGUINAL HERNIA REPAIR Right 2014   Dr [name]  . LEFT HEART CATH AND CORONARY ANGIOGRAPHY Left 08/05/2020   Procedure: LEFT HEART CATH AND CORONARY ANGIOGRAPHY poss PCI;  Surgeon: Wellington Hampshire, MD;  Location: [facility];  Service: Cardiovascular;  Laterality: Left;     Family History  Problem Relation Age of Onset  . Heart Problems Mother   . Heart Problems Father 22       myocardial infarction  . Mental illness Brother   . Testicular cancer Paternal Uncle   . Other Maternal Aunt        COVID  . Breast cancer Cousin     Social History   Tobacco Use  . Smoking status: Former Smoker    Years: 2.00    Types: Cigars    Start date: 12/28/2002    Quit date: 06/04/2005    Years since quitting: 15.3  . Smokeless tobacco: Never Used  Substance Use Topics  . Alcohol use: Yes    Alcohol/week: 1.0 standard drink    Types: 1 Glasses of wine per week    Comment: RARE BEER     Current Outpatient Medications:  .  ACCU-CHEK AVIVA PLUS test strip, USE AS DIRECTED TO TEST BLOOD SUGAR THREE TIMES DAILY AND AS NEEDED, Disp: 100 strip, Rfl: 3 .  aspirin 81 MG tablet, Take 81 mg by mouth daily., Disp: , Rfl:  .  atorvastatin (LIPITOR) 80 MG tablet, Take 1 tablet (80 mg total) by mouth daily., Disp: 90 tablet, Rfl:  1 .  blood glucose meter kit and supplies KIT, Dispense based on patient and insurance preference. Use up to four times daily as directed. (FOR ICD-9 250.00, 250.01)., Disp: 1 each, Rfl: 0 .  carvedilol (COREG) 6.25 MG tablet, Take 1 tablet (6.25 mg total) by mouth 2 (two) times daily., Disp: 180 tablet, Rfl: 0 .  cetirizine (ZYRTEC) 10 MG tablet, Take 10 mg by mouth daily as needed for allergies. , Disp: , Rfl: 1 .  dapagliflozin propanediol (FARXIGA) 10 MG TABS tablet, Take 1 tablet (10 mg total) by mouth daily before breakfast., Disp: 90 tablet, Rfl: 0 .  dicyclomine (BENTYL) 10 MG capsule, TAKE 1 CAPSULE BY MOUTH THREE TIMES A DAY BEFORE MEALS (Patient taking differently: Take 10 mg by mouth 3 (three) times daily before meals. ), Disp: 90 capsule, Rfl: 0 .  Dulaglutide (TRULICITY) 1.5 WU/9.8JX SOPN, Inject 1.5 mg into the skin once a week., Disp: 2 mL, Rfl: 2 .  ezetimibe (ZETIA) 10 MG tablet, Take 1 tablet (10 mg total) by mouth daily., Disp: 90  tablet, Rfl: 1 .  fluticasone (FLONASE) 50 MCG/ACT nasal spray, Place 2 sprays into the nose daily as needed for allergies or rhinitis. , Disp: , Rfl:  .  ibuprofen (ADVIL) 800 MG tablet, Take 800 mg by mouth daily as needed for mild pain or moderate pain. , Disp: , Rfl:  .  isosorbide mononitrate (IMDUR) 30 MG 24 hr tablet, Take 0.5 tablets (15 mg total) by mouth daily., Disp: 45 tablet, Rfl: 3 .  losartan-hydrochlorothiazide (HYZAAR) 50-12.5 MG tablet, Take 1 tablet by mouth daily., Disp: 90 tablet, Rfl: 0 .  lubiprostone (AMITIZA) 24 MCG capsule, TAKE 1 CAPSULE(24 MCG) BY MOUTH TWICE DAILY WITH A MEAL (Patient taking differently: Take 24 mcg by mouth 2 (two) times daily with a meal. ), Disp: 60 capsule, Rfl: 0 .  NITROSTAT 0.4 MG SL tablet, place 1 tablet under the tongue every 5 minutes for UP TO 3 doses if needed for angina as directed by prescriber (Patient taking differently: Place 0.4 mg under the tongue every 5 (five) minutes as needed for chest pain. ), Disp: 25 tablet, Rfl: 1 .  ranolazine (RANEXA) 1000 MG SR tablet, TAKE 1 TABLET(1000 MG) BY MOUTH TWICE DAILY (Patient taking differently: Take 1,000 mg by mouth 2 (two) times daily. ), Disp: 180 tablet, Rfl: 3 .  selenium sulfide (SELSUN) 2.5 % shampoo, Apply 1 application topically daily as needed for irritation. (Patient taking differently: Apply 1 application topically daily. ), Disp: 118 mL, Rfl: 12  Allergies  Allergen Reactions  . Gabapentin Other (See Comments)    Groggy-Mood Changes  . Latex Rash  . Penicillins Hives and Rash    Has patient had a PCN reaction causing immediate rash, facial/tongue/throat swelling, SOB or lightheadedness with hypotension: Yes Has patient had a PCN reaction causing severe rash involving mucus membranes or skin necrosis: No Has patient had a PCN reaction that required hospitalization No Has patient had a PCN reaction occurring within the last 10 years: No If all of the above answers are "NO", then  may proceed with Cephalosporin use.     I personally reviewed active problem list, medication list, allergies, family history, social history, health maintenance with the patient/caregiver today.   ROS  Constitutional: Negative for fever , positive for weight change.  Respiratory: Negative for cough and shortness of breath.   Cardiovascular: Negative for chest pain or palpitations.  Gastrointestinal: Negative for abdominal pain, no bowel changes.  Musculoskeletal: positive  for gait problem but no  joint swelling.  Skin: Negative for rash.  Neurological: Negative for dizziness or headache.  No other specific complaints in a complete review of systems (except as listed in HPI above).  Objective  Vitals:   09/26/20 1056  BP: 132/86  Pulse: 81  Resp: 18  Temp: 98.2 F (36.8 C)  TempSrc: Oral  SpO2: 99%  Weight: 235 lb 9.6 oz (106.9 kg)  Height: '5\' 11"'  (1.803 m)    Body mass index is 32.86 kg/m.  Physical Exam  Constitutional: Patient appears well-developed and well-nourished. Obese  No distress.  HEENT: head atraumatic, normocephalic, pupils equal and reactive to light,  neck supple Cardiovascular: Normal rate, regular rhythm and normal heart sounds.  No murmur heard. No BLE edema. Pulmonary/Chest: Effort normal and breath sounds normal. No respiratory distress. Abdominal: Soft.  There is no tenderness. Skin: hyperpigmented patches of different shapes and sizes  but mostly oval on trunk, arms and legs, some scaly, no erythema or oozing  Muscular skeletal: pain during palpation of sciatic notch and right lumbar spine, normal gait, negative straight leg raise Psychiatric: Patient has a normal mood and affect. behavior is normal. Judgment and thought content normal.   Recent Results (from the past 2160 hour(s))  Basic metabolic panel     Status: Abnormal   Collection Time: 07/25/20 10:57 AM  Result Value Ref Range   Glucose 123 (H) 65 - 99 mg/dL   BUN 15 6 - 24 mg/dL    Creatinine, Ser 1.12 0.76 - 1.27 mg/dL   GFR calc non Af Amer 73 >59 mL/min/1.73   GFR calc Af Amer 84 >59 mL/min/1.73    Comment: **[facility] currently reports eGFR in compliance with the current**   recommendations of the [employer]. [facility] will   update reporting as new guidelines are published from the NKF-ASN   Task force.    BUN/Creatinine Ratio 13 9 - 20   Sodium 137 134 - 144 mmol/L   Potassium 3.8 3.5 - 5.2 mmol/L   Chloride 99 96 - 106 mmol/L   CO2 22 20 - 29 mmol/L   Calcium 9.5 8.7 - 10.2 mg/dL  CBC w/Diff     Status: Abnormal   Collection Time: 07/25/20 10:57 AM  Result Value Ref Range   WBC 8.0 3.4 - 10.8 x10E3/uL   RBC 4.98 4.14 - 5.80 x10E6/uL   Hemoglobin 16.8 13.0 - 17.7 g/dL   Hematocrit 46.7 37.5 - 51.0 %   MCV 94 79 - 97 fL   MCH 33.7 (H) 26.6 - 33.0 pg   MCHC 36.0 (H) 31 - 35 g/dL   RDW 13.0 11.6 - 15.4 %   Platelets 238 150 - 450 x10E3/uL   Neutrophils 47 Not Estab. %   Lymphs 41 Not Estab. %   Monocytes 9 Not Estab. %   Eos 1 Not Estab. %   Basos 1 Not Estab. %   Neutrophils Absolute 3.7 1 - 7 x10E3/uL   Lymphocytes Absolute 3.3 (H) 0 - 3 x10E3/uL   Monocytes Absolute 0.7 0 - 0 x10E3/uL   EOS (ABSOLUTE) 0.1 0.0 - 0.4 x10E3/uL   Basophils Absolute 0.1 0 - 0 x10E3/uL   Immature Granulocytes 1 Not Estab. %   Immature Grans (Abs) 0.1 0.0 - 0.1 x10E3/uL  SARS CORONAVIRUS 2 (TAT 6-24 HRS) Nasopharyngeal Nasopharyngeal Swab     Status: None   Collection Time: 08/02/20 10:19 AM   Specimen: Nasopharyngeal Swab  Result  Value Ref Range   SARS Coronavirus 2 NEGATIVE NEGATIVE    Comment: (NOTE) SARS-CoV-2 target nucleic acids are NOT DETECTED.  The SARS-CoV-2 RNA is generally detectable in upper and lower respiratory specimens during the acute phase of infection. Negative results do not preclude SARS-CoV-2 infection, do not rule out co-infections with other pathogens, and should not be used as the sole basis for treatment or other patient  management decisions. Negative results must be combined with clinical observations, patient history, and epidemiological information. The expected result is Negative.  Fact Sheet for Patients: SugarRoll.be  Fact Sheet for Healthcare Providers: https://www.woods-mathews.com/  This test is not yet approved or cleared by the [place] FDA and  has been authorized for detection and/or diagnosis of SARS-CoV-2 by FDA under an Emergency Use Authorization (EUA). This EUA will remain  in effect (meaning this test can be used) for the duration of the COVID-19 declaration under Se ction 564(b)(1) of the Act, 21 U.S.C. section 360bbb-3(b)(1), unless the authorization is terminated or revoked sooner.  Performed at [facility], [place] [address], [place], [place] [address]   Glucose, capillary     Status: Abnormal   Collection Time: 08/05/20  9:12 AM  Result Value Ref Range   Glucose-Capillary 102 (H) 70 - 99 mg/dL    Comment: Glucose reference range applies only to samples taken after fasting for at least 8 hours.  POCT HgB A1C     Status: Normal   Collection Time: 09/26/20 11:00 AM  Result Value Ref Range   Hemoglobin A1C 5.3 4.0 - 5.6 %   HbA1c POC (<> result, manual entry)     HbA1c, POC (prediabetic range)     HbA1c, POC (controlled diabetic range)       PHQ2/9: Depression screen [facility] 2/9 09/26/2020 08/14/2020 06/07/2020 01/29/2020 09/28/2019  Decreased Interest 0 0 0 0 0  Down, Depressed, Hopeless 0 0 0 0 0  PHQ - 2 Score 0 0 0 0 0  Altered sleeping - 0 0 0 2  Tired, decreased energy - 0 0 0 3  Change in appetite - 0 0 0 1  Feeling bad or failure about yourself  - 0 0 0 0  Trouble concentrating - 0 0 0 0  Moving slowly or fidgety/restless - 0 0 0 0  Suicidal thoughts - 0 0 0 0  PHQ-9 Score - 0 0 0 6  Difficult doing work/chores - - - Not difficult at all Somewhat difficult  Some recent data might be hidden    phq 9 is  negative   Fall Risk: Fall Risk  09/26/2020 08/14/2020 06/07/2020 01/29/2020 09/28/2019  Falls in the past year? 1 0 1 0 0  Number falls in past yr: 0 0 1 0 0  Injury with Fall? 0 0 1 0 0  Comment - - - - -  Risk for fall due to : History of fall(s) - - - -  Follow up - - - - -     Functional Status Survey: Is the patient deaf or have difficulty hearing?: No Does the patient have difficulty seeing, even when wearing glasses/contacts?: No Does the patient have difficulty concentrating, remembering, or making decisions?: No Does the patient have difficulty walking or climbing stairs?: No Does the patient have difficulty dressing or bathing?: No Does the patient have difficulty doing errands alone such as visiting a doctor's office or shopping?: No    Assessment & Plan  1. Controlled type 2 diabetes mellitus  with microalbuminuria, without long-term current use of insulin (HCC)  - POCT HgB A1C - HM Diabetes Foot Exam - dapagliflozin propanediol (FARXIGA) 10 MG TABS tablet; Take 1 tablet (10 mg total) by mouth daily before breakfast.  Dispense: 90 tablet; Refill: 0 - Dulaglutide (TRULICITY) 1.5 ZO/1.0RU SOPN; Inject 1.5 mg into the skin once a week.  Dispense: 2 mL; Refill: 2 - Microalbumin / creatinine urine ratio  2. Need for immunization against influenza  - Flu Vaccine QUAD 36+ mos IM  3. Mixed hyperlipidemia  - atorvastatin (LIPITOR) 80 MG tablet; Take 1 tablet (80 mg total) by mouth daily.  Dispense: 90 tablet; Refill: 1 - ezetimibe (ZETIA) 10 MG tablet; Take 1 tablet (10 mg total) by mouth daily.  Dispense: 90 tablet; Refill: 1  4. Essential hypertension  - carvedilol (COREG) 6.25 MG tablet; Take 1 tablet (6.25 mg total) by mouth 2 (two) times daily.  Dispense: 180 tablet; Refill: 0 - CBC with Differential/Platelet - COMPLETE METABOLIC PANEL WITH GFR  5. Morbid obesity (HCC)  - Dulaglutide (TRULICITY) 1.5 EA/5.4UJ SOPN; Inject 1.5 mg into the skin once a week.   Dispense: 2 mL; Refill: 2  6. Controlled type 2 diabetes with neuropathy ([place])   7. Hypertension associated with diabetes ([place])  - Lipid panel  8. Coronary artery disease due to lipid rich plaque  - Lipid panel  9. Rash  - Ambulatory referral to Dermatology  10. Sciatica of right side  - Ambulatory referral to Orthopedic Surgery  11. Dyslipidemia associated with type 2 diabetes mellitus (HCC)  - Lipid panel

## 2020-09-26 ENCOUNTER — Encounter: Payer: Self-pay | Admitting: Cardiovascular Disease

## 2020-09-26 ENCOUNTER — Ambulatory Visit (INDEPENDENT_AMBULATORY_CARE_PROVIDER_SITE_OTHER): Payer: Medicaid Other | Admitting: Family Medicine

## 2020-09-26 ENCOUNTER — Encounter: Payer: Self-pay | Admitting: Family Medicine

## 2020-09-26 ENCOUNTER — Other Ambulatory Visit: Payer: Self-pay

## 2020-09-26 ENCOUNTER — Other Ambulatory Visit: Payer: Self-pay | Admitting: Family Medicine

## 2020-09-26 ENCOUNTER — Ambulatory Visit (INDEPENDENT_AMBULATORY_CARE_PROVIDER_SITE_OTHER): Payer: Medicaid Other | Admitting: Cardiovascular Disease

## 2020-09-26 VITALS — BP 132/86 | HR 81 | Temp 98.2°F | Resp 18 | Ht 71.0 in | Wt 235.6 lb

## 2020-09-26 VITALS — BP 140/78 | HR 76 | Ht 71.0 in | Wt 236.0 lb

## 2020-09-26 DIAGNOSIS — Z23 Encounter for immunization: Secondary | ICD-10-CM

## 2020-09-26 DIAGNOSIS — E1129 Type 2 diabetes mellitus with other diabetic kidney complication: Secondary | ICD-10-CM | POA: Diagnosis not present

## 2020-09-26 DIAGNOSIS — I2583 Coronary atherosclerosis due to lipid rich plaque: Secondary | ICD-10-CM

## 2020-09-26 DIAGNOSIS — R809 Proteinuria, unspecified: Secondary | ICD-10-CM | POA: Diagnosis not present

## 2020-09-26 DIAGNOSIS — E782 Mixed hyperlipidemia: Secondary | ICD-10-CM

## 2020-09-26 DIAGNOSIS — M5431 Sciatica, right side: Secondary | ICD-10-CM | POA: Diagnosis not present

## 2020-09-26 DIAGNOSIS — I2510 Atherosclerotic heart disease of native coronary artery without angina pectoris: Secondary | ICD-10-CM | POA: Diagnosis not present

## 2020-09-26 DIAGNOSIS — E1169 Type 2 diabetes mellitus with other specified complication: Secondary | ICD-10-CM

## 2020-09-26 DIAGNOSIS — I10 Essential (primary) hypertension: Secondary | ICD-10-CM

## 2020-09-26 DIAGNOSIS — I152 Hypertension secondary to endocrine disorders: Secondary | ICD-10-CM

## 2020-09-26 DIAGNOSIS — R21 Rash and other nonspecific skin eruption: Secondary | ICD-10-CM | POA: Diagnosis not present

## 2020-09-26 DIAGNOSIS — I25118 Atherosclerotic heart disease of native coronary artery with other forms of angina pectoris: Secondary | ICD-10-CM | POA: Diagnosis not present

## 2020-09-26 DIAGNOSIS — E1159 Type 2 diabetes mellitus with other circulatory complications: Secondary | ICD-10-CM

## 2020-09-26 DIAGNOSIS — E785 Hyperlipidemia, unspecified: Secondary | ICD-10-CM

## 2020-09-26 DIAGNOSIS — E1140 Type 2 diabetes mellitus with diabetic neuropathy, unspecified: Secondary | ICD-10-CM

## 2020-09-26 LAB — POCT GLYCOSYLATED HEMOGLOBIN (HGB A1C): Hemoglobin A1C: 5.3 % (ref 4.0–5.6)

## 2020-09-26 MED ORDER — DAPAGLIFLOZIN PROPANEDIOL 10 MG PO TABS: 10.0000 mg | ORAL_TABLET | Freq: Every day | ORAL | 0 refills | Status: DC

## 2020-09-26 MED ORDER — ATORVASTATIN CALCIUM 80 MG PO TABS: 80.0000 mg | ORAL_TABLET | Freq: Every day | ORAL | 1 refills | Status: DC

## 2020-09-26 MED ORDER — EZETIMIBE 10 MG PO TABS: 10.0000 mg | ORAL_TABLET | Freq: Every day | ORAL | 1 refills | Status: DC

## 2020-09-26 MED ORDER — TRULICITY 1.5 MG/0.5ML ~~LOC~~ SOAJ: 1.5000 mg | SUBCUTANEOUS | 2 refills | Status: DC

## 2020-09-26 MED ORDER — CARVEDILOL 6.25 MG PO TABS: 6.2500 mg | ORAL_TABLET | Freq: Two times a day (BID) | ORAL | 0 refills | Status: DC

## 2020-09-26 NOTE — Patient Instructions (Signed)

## 2020-09-26 NOTE — Progress Notes (Signed)
Cardiology Office Note   Date:  09/26/2020   ID:  [name], [name] September 01, 1961, MRN [id number]  PCP:  Steele Sizer, MD  Cardiologist:   [name], MD   Chief Complaint  Patient presents with  . other    4-6 week follow up. Pt. c/o weight loss, shortness of breath with just walking to the mailbox, feels exhausted, chest pain and BP decreased.       History of Present Illness: [name] [name] is a 59 y.o. male who presents for a follow-up visit regarding mild -moderate one-vessel coronary artery disease with stable angina and suspected endothelial dysfunction. He has known history of type 2 diabetes, hypertension, hyperlipidemia and obesity. He is a previous smoker. Cardiac catheterization in June 2016 showed moderate mid LAD stenosis (50%) with an FFR ratio of 0.83. LV systolic function was normal with normal left ventricular end-diastolic pressure. He had worsening angina in April 2017. Repeat cardiac catheterization showed improvement in mid LAD stenosis to 30% and no evidence of obstructive coronary artery disease.  He does have sleep apnea on CPAP . The patient had recurrent episodes of loss of consciousness of unclear etiology in 2018.  Echocardiogram showed normal ejection fraction.  2-week outpatient monitor showed no significant arrhythmia.  It was felt that his episodes are not cardiac in origin and he has been evaluated by neurology but no clear etiology was identified according to him.    The patient did have recurrent concussions growing up when he played sports.  He had recent worsening of angina in August.  I repeated his cardiac catheterization which showed mild nonobstructive one-vessel coronary artery disease with 30% stenosis in the mid LAD.  Ejection fraction was normal with mildly elevated left ventricular end-diastolic pressure.  The patient was treated medically and ultimately small dose Imdur was added to his medications.  He reports improvement in  symptoms overall.  He has minimal chest discomfort although he continues to complain of shortness of breath and fatigue.  He is now brisk walking 1 mile daily.     Past Medical History:  Diagnosis Date  . Allergy   . Angina, class III ([place])   . Chronic constipation   . Degeneration of intervertebral disc of cervical region   . Diabetes mellitus without complication ([place])   . Diastolic dysfunction    a. 06/2017 Echo: EF 60-65%, no rwma, Gr1 DD.  Marland Kitchen Dyspnea   . GERD (gastroesophageal reflux disease)   . Hernia 1991   02/10/2012-RIH repair  . Hyperlipidemia   . Hypertension 2008  . Nerve root pain   . Neuropathy   . Non-obstructive CAD (coronary artery disease)    a. 05/30/2015 cath: LM nl, mLAD 50% (FFR 0.83), LCx minor irregs, RCA minor irregs, EF 55-65%-->Med Rx; b. 03/2016 Cath: LM nl LAD 68m D1/2/3 min irregs, LCX min irregs, OM1/2/3 nl, RCA min irregs, RPDA/RPAV/RPL1/RPL2 nl-->Med Rx; b. 07/2020 Cath: LM nl, LAD 390mD1/2/3 min irregs, LCX min irregs, RCA min irregs, RPDA/RPAV/RPL1,2 nl, EF 55-65%.  . Obesity, unspecified 2012  . Personal history of tobacco use, presenting hazards to health 2012  . Polycythemia   . Rectal bleeding 02/11/2017  . Recurrent Right Inguinal Hernia Repair 02/09/2011, [facility]/07/2013   [facility] Sleep apnea    a. On CPAP;  b. 06/2017 Echo: no PAH.  . Marland Kitchenmbilical hernia without mention of obstruction or gangrene 02/09/2011   [facility] Vitamin D deficiency  Past Surgical History:  Procedure Laterality Date  . BACK SURGERY  12/2008   X2-LUMBAR  . CARDIAC CATHETERIZATION N/A 05/30/2015   Procedure: Left Heart Cath;  Surgeon: Wellington Hampshire, MD;  Location: [place] [facility];  Service: Cardiovascular;  Laterality: N/A;  . CARDIAC CATHETERIZATION N/A 04/16/2016   Procedure: Left Heart Cath and Coronary Angiography;  Surgeon: Wellington Hampshire, MD;  Location: [place] [facility];  Service: Cardiovascular;  Laterality: N/A;  . COLONOSCOPY  2014     Dr. [name]  . COLONOSCOPY WITH PROPOFOL N/A 12/25/2016   Procedure: COLONOSCOPY WITH PROPOFOL;  Surgeon: Jonathon Bellows, MD;  Location: [facility] ENDOSCOPY;  Service: Endoscopy;  Laterality: N/A;  . COLONOSCOPY WITH PROPOFOL N/A 01/29/2017   Procedure: COLONOSCOPY WITH PROPOFOL;  Surgeon: Jonathon Bellows, MD;  Location: [facility] ENDOSCOPY;  Service: Endoscopy;  Laterality: N/A;  . COLONOSCOPY WITH PROPOFOL N/A 04/11/2020   Procedure: COLONOSCOPY WITH PROPOFOL;  Surgeon: Jonathon Bellows, MD;  Location: [facility] ENDOSCOPY;  Service: Gastroenterology;  Laterality: N/A;  . ESOPHAGOGASTRODUODENOSCOPY (EGD) WITH PROPOFOL N/A 01/26/2019   Procedure: ESOPHAGOGASTRODUODENOSCOPY (EGD) WITH PROPOFOL;  Surgeon: Lin Landsman, MD;  Location: [facility] ENDOSCOPY;  Service: Gastroenterology;  Laterality: N/A;  . EVALUATION UNDER ANESTHESIA WITH HEMORRHOIDECTOMY N/A 02/24/2017   Procedure: EXAM UNDER ANESTHESIA WITH POSSIBLE EXCISION OF INTERNAL HEMORRHOIDS;  Surgeon: Olean Ree, MD;  Location: [facility] ORS;  Service: General;  Laterality: N/A;  . FISSURECTOMY  02/24/2017   Procedure: FISSURECTOMY;  Surgeon: Olean Ree, MD;  Location: [facility] ORS;  Service: General;;  . HAND SURGERY Left 2020  . HERNIA REPAIR Right 1991  . INGUINAL HERNIA REPAIR Right 2012   Dr [name]  . INGUINAL HERNIA REPAIR Right 2014   Dr [name]  . LEFT HEART CATH AND CORONARY ANGIOGRAPHY Left 08/05/2020   Procedure: LEFT HEART CATH AND CORONARY ANGIOGRAPHY poss PCI;  Surgeon: Wellington Hampshire, MD;  Location: [place] [facility];  Service: Cardiovascular;  Laterality: Left;     Current Outpatient Medications  Medication Sig Dispense Refill  . ACCU-CHEK AVIVA PLUS test strip USE AS DIRECTED TO TEST BLOOD SUGAR THREE TIMES DAILY AND AS NEEDED 100 strip 3  . aspirin 81 MG tablet Take 81 mg by mouth daily.    Marland Kitchen atorvastatin (LIPITOR) 80 MG tablet Take 1 tablet (80 mg total) by mouth daily. 90 tablet 1  . blood glucose meter kit and supplies KIT Dispense based on  patient and insurance preference. Use up to four times daily as directed. (FOR ICD-9 250.00, 250.01). 1 each 0  . carvedilol (COREG) 12.5 MG tablet Take 0.5 tablets (6.25 mg total) by mouth 2 (two) times daily. 180 tablet 3  . cetirizine (ZYRTEC) 10 MG tablet Take 10 mg by mouth daily as needed for allergies.   1  . dapagliflozin propanediol (FARXIGA) 10 MG TABS tablet Take 1 tablet (10 mg total) by mouth daily before breakfast. 90 tablet 0  . dicyclomine (BENTYL) 10 MG capsule TAKE 1 CAPSULE BY MOUTH THREE TIMES A DAY BEFORE MEALS (Patient taking differently: Take 10 mg by mouth 3 (three) times daily before meals. ) 90 capsule 0  . ezetimibe (ZETIA) 10 MG tablet TAKE 1 TABLET(10 MG) BY MOUTH DAILY (Patient taking differently: Take 10 mg by mouth daily. ) 90 tablet 1  . fluticasone (FLONASE) 50 MCG/ACT nasal spray Place 2 sprays into the nose daily as needed for allergies or rhinitis.     Marland Kitchen ibuprofen (ADVIL) 800 MG tablet Take 800 mg by mouth daily  as needed for mild pain or moderate pain.     Marland Kitchen insulin glargine (LANTUS SOLOSTAR) 100 UNIT/ML Solostar Pen INJECT 28 UNITS INTO THE SKIN DAILY (Patient taking differently: Inject 14 Units into the skin daily. ) 10 mL 0  . Insulin Pen Needle (BD PEN NEEDLE NANO 2ND GEN) 32G X 4 MM MISC USE AS DIRECTED 100 each 12  . isosorbide mononitrate (IMDUR) 30 MG 24 hr tablet Take 0.5 tablets (15 mg total) by mouth daily. 45 tablet 3  . losartan-hydrochlorothiazide (HYZAAR) 50-12.5 MG tablet Take 1 tablet by mouth daily. 90 tablet 0  . lubiprostone (AMITIZA) 24 MCG capsule TAKE 1 CAPSULE(24 MCG) BY MOUTH TWICE DAILY WITH A MEAL (Patient taking differently: Take 24 mcg by mouth 2 (two) times daily with a meal. ) 60 capsule 0  . metFORMIN (GLUCOPHAGE) 500 MG tablet TAKE 1 TABLET(500 MG) BY MOUTH TWICE DAILY WITH A MEAL 180 tablet 0  . NITROSTAT 0.4 MG SL tablet place 1 tablet under the tongue every 5 minutes for UP TO 3 doses if needed for angina as directed by  prescriber (Patient taking differently: Place 0.4 mg under the tongue every 5 (five) minutes as needed for chest pain. ) 25 tablet 1  . ranolazine (RANEXA) 1000 MG SR tablet TAKE 1 TABLET(1000 MG) BY MOUTH TWICE DAILY (Patient taking differently: Take 1,000 mg by mouth 2 (two) times daily. ) 180 tablet 3  . selenium sulfide (SELSUN) 2.5 % shampoo Apply 1 application topically daily as needed for irritation. (Patient taking differently: Apply 1 application topically daily. ) 118 mL 12  . TRULICITY 1.5 MA/2.6JF SOPN ADMINISTER 1.5 MG UNDER THE SKIN 1 TIME A WEEK 2 mL 1   No current facility-administered medications for this visit.    Allergies:   Gabapentin, Latex, and Penicillins    Social History:  The patient  reports that he quit smoking about 15 years ago. His smoking use included cigars. He started smoking about 17 years ago. He quit after 2.00 years of use. He has never used smokeless tobacco. He reports current alcohol use of about 1.0 standard drink of alcohol per week. He reports that he does not use drugs.   Family History:  The patient's family history includes Breast cancer in his cousin; Heart Problems in his mother; Heart Problems (age of onset: 41) in his father; Mental illness in his brother; Other in his maternal aunt; Testicular cancer in his paternal uncle.    ROS:  Please see the history of present illness.   Otherwise, review of systems are positive for none.   All other systems are reviewed and negative.    PHYSICAL EXAM: VS:  BP 140/78 (BP Location: Left Arm, Patient Position: Sitting, Cuff Size: Large)   Pulse 76   Ht _0  (1.803 m)   Wt 236 lb (107 kg)   SpO2 98%   BMI 32.92 kg/m  , BMI Body mass index is 32.92 kg/m. GEN: Well nourished, well developed, in no acute distress  HEENT: normal  Neck: no JVD, carotid bruits, or masses Cardiac: RRR; no murmurs, rubs, or gallops,no edema  Respiratory:  clear to auscultation bilaterally, normal work of breathing GI:  soft, nontender, nondistended, + BS MS: no deformity or atrophy  Skin: warm and dry, no rash Neuro:  Strength and sensation are intact Psych: euthymic mood, full affect Right radial pulses normal.   EKG:  EKG is  ordered today.  EKG shows normal sinus rhythm with no significant ST  changes.  Recent Labs: 09/28/2019: ALT 31 07/25/2020: BUN 15; Creatinine, Ser 1.12; Hemoglobin 16.8; Platelets 238; Potassium 3.8; Sodium 137    Lipid Panel    Component Value Date/Time   CHOL 133 09/28/2019 0000   CHOL 167 05/08/2016 0803   TRIG 146 09/28/2019 0000   HDL 42 09/28/2019 0000   HDL 52 05/08/2016 0803   CHOLHDL 3.2 09/28/2019 0000   LDLCALC 68 09/28/2019 0000      Wt Readings from Last 3 Encounters:  09/26/20 236 lb (107 kg)  08/22/20 247 lb (112 kg)  08/14/20 245 lb 12.8 oz (111.5 kg)        ASSESSMENT AND PLAN:  1.  Coronary artery disease involving native coronary arteries with other forms of angina: Suspected endothelial dysfunction.  No evidence of obstructive coronary artery disease on most recent cardiac catheterization.  Symptoms improved with addition of small dose Imdur.  He is also on carvedilol and Ranexa.  Continue same medications.   2. Hyperlipidemia:  Continue treatment with high dose atorvastatin.  Most recent lipid profile showed an LDL of 68 and triglyceride of 146.  3. Essential hypertension: Blood pressure is well controlled.  4.  Type 2 diabetes: Well controlled with most recent hemoglobin A1c of 6.  5.  Shortness of breath: Suspect some element of deconditioning and I encouraged him to continue with his exercise program.  He is trying to lose weight and is currently down to 236 from a previous peak of 263 pounds.   Disposition:   FU with me in 6 months  Signed,  [name], MD  09/26/2020 9:49 AM    [place]

## 2020-09-27 ENCOUNTER — Other Ambulatory Visit: Payer: Self-pay

## 2020-09-27 ENCOUNTER — Telehealth: Payer: Self-pay

## 2020-09-27 DIAGNOSIS — R809 Proteinuria, unspecified: Secondary | ICD-10-CM

## 2020-09-27 LAB — CBC WITH DIFFERENTIAL/PLATELET
Absolute Monocytes: 884 cells/uL (ref 200–950)
Basophils Absolute: 42 cells/uL (ref 0–200)
Basophils Relative: 0.4 %
Eosinophils Absolute: 73 cells/uL (ref 15–500)
Eosinophils Relative: 0.7 %
HCT: 51.9 % — ABNORMAL HIGH (ref 38.5–50.0)
Hemoglobin: 17.8 g/dL — ABNORMAL HIGH (ref 13.2–17.1)
Lymphs Abs: 3723 cells/uL (ref 850–3900)
MCH: 32.4 pg (ref 27.0–33.0)
MCHC: 34.3 g/dL (ref 32.0–36.0)
MCV: 94.4 fL (ref 80.0–100.0)
MPV: 9.5 fL (ref 7.5–12.5)
Monocytes Relative: 8.5 %
Neutro Abs: 5678 cells/uL (ref 1500–7800)
Neutrophils Relative %: 54.6 %
Platelets: 252 10*3/uL (ref 140–400)
RBC: 5.5 10*6/uL (ref 4.20–5.80)
RDW: 12.9 % (ref 11.0–15.0)
Total Lymphocyte: 35.8 %
WBC: 10.4 10*3/uL (ref 3.8–10.8)

## 2020-09-27 LAB — COMPLETE METABOLIC PANEL WITH GFR
AG Ratio: 1.6 (calc) (ref 1.0–2.5)
ALT: 49 U/L — ABNORMAL HIGH (ref 9–46)
AST: 75 U/L — ABNORMAL HIGH (ref 10–35)
Albumin: 4.7 g/dL (ref 3.6–5.1)
Alkaline phosphatase (APISO): 72 U/L (ref 35–144)
BUN: 13 mg/dL (ref 7–25)
CO2: 26 mmol/L (ref 20–32)
Calcium: 10 mg/dL (ref 8.6–10.3)
Chloride: 100 mmol/L (ref 98–110)
Creat: 0.92 mg/dL (ref 0.70–1.33)
GFR, Est African American: 106 mL/min/{1.73_m2} (ref >=60)
GFR, Est Non African American: 91 mL/min/{1.73_m2} (ref >=60)
Globulin: 2.9 g/dL (calc) (ref 1.9–3.7)
Glucose, Bld: 80 mg/dL (ref 65–99)
Potassium: 4.3 mmol/L (ref 3.5–5.3)
Sodium: 139 mmol/L (ref 135–146)
Total Bilirubin: 0.7 mg/dL (ref 0.2–1.2)
Total Protein: 7.6 g/dL (ref 6.1–8.1)

## 2020-09-27 LAB — LIPID PANEL
Cholesterol: 134 mg/dL (ref <200)
HDL: 46 mg/dL (ref >=40)
LDL Cholesterol (Calc): 68 mg/dL (calc)
Non-HDL Cholesterol (Calc): 88 mg/dL (calc) (ref <130)
Total CHOL/HDL Ratio: 2.9 (calc) (ref <5.0)
Triglycerides: 121 mg/dL (ref <150)

## 2020-09-27 LAB — MICROALBUMIN / CREATININE URINE RATIO
Creatinine, Urine: 426 mg/dL — ABNORMAL HIGH (ref 20–320)
Microalb Creat Ratio: 215 mcg/mg creat — ABNORMAL HIGH (ref <30)
Microalb, Ur: 91.6 mg/dL

## 2020-09-27 NOTE — Telephone Encounter (Signed)
Copied from [place] [id number]. Topic: General - Inquiry >> Sep 27, 2020 12:52 PM Gillis Ends D wrote: Reason for CRM: Patient called and said that he went to the pharmacy to get his test strips and the pharmacy told him that the ones he is currently using, is no longer available through his insurance. He needs a new prescription for the meter, test strips and lancets. He was unsure which one his insurance does cover. He can be reached at [phone]. Please advise

## 2020-09-30 MED ORDER — BLOOD GLUCOSE METER KIT
PACK | 0 refills | Status: DC
Start: 2020-09-30 — End: 2020-10-09

## 2020-09-30 NOTE — Addendum Note (Signed)
Addended by: Steele Sizer F on: 09/30/2020 02:14 PM   Modules accepted: Orders

## 2020-10-01 DIAGNOSIS — M545 Low back pain, unspecified: Secondary | ICD-10-CM | POA: Diagnosis not present

## 2020-10-01 DIAGNOSIS — M5136 Other intervertebral disc degeneration, lumbar region: Secondary | ICD-10-CM | POA: Diagnosis not present

## 2020-10-01 DIAGNOSIS — M5416 Radiculopathy, lumbar region: Secondary | ICD-10-CM | POA: Diagnosis not present

## 2020-10-07 ENCOUNTER — Other Ambulatory Visit: Payer: Self-pay

## 2020-10-09 ENCOUNTER — Other Ambulatory Visit: Payer: Self-pay

## 2020-10-09 ENCOUNTER — Other Ambulatory Visit: Payer: Self-pay | Admitting: Family Medicine

## 2020-10-09 ENCOUNTER — Ambulatory Visit: Payer: Medicaid Other | Admitting: Dermatology

## 2020-10-09 DIAGNOSIS — D2372 Other benign neoplasm of skin of left lower limb, including hip: Secondary | ICD-10-CM | POA: Diagnosis not present

## 2020-10-09 DIAGNOSIS — E1129 Type 2 diabetes mellitus with other diabetic kidney complication: Secondary | ICD-10-CM

## 2020-10-09 DIAGNOSIS — D485 Neoplasm of uncertain behavior of skin: Secondary | ICD-10-CM | POA: Diagnosis not present

## 2020-10-09 DIAGNOSIS — B356 Tinea cruris: Secondary | ICD-10-CM | POA: Diagnosis not present

## 2020-10-09 DIAGNOSIS — D492 Neoplasm of unspecified behavior of bone, soft tissue, and skin: Secondary | ICD-10-CM

## 2020-10-09 DIAGNOSIS — D239 Other benign neoplasm of skin, unspecified: Secondary | ICD-10-CM

## 2020-10-09 MED ORDER — BLOOD GLUCOSE METER KIT: 1.0000 | PACK | Freq: Two times a day (BID) | 0 refills | Status: AC

## 2020-10-09 NOTE — Progress Notes (Signed)
   Follow-Up Visit   Subjective  [name] [name] is a 58 y.o. male who presents for the following: Rash (spots trunk, groin, legs, arms, neck scalp, ~70m selenium sulfide shampoo  x 65m, not helping). He has a few other spots he would like checked.  The following portions of the chart were reviewed this encounter and updated as appropriate:  Tobacco  Allergies  Meds  Problems  Med Hx  Surg Hx  Fam Hx     Patient referred from Dr. Ancil Boozer.  Review of Systems:  No other skin or systemic complaints except as noted in HPI or Assessment and Plan.  Objective  Well appearing patient in no apparent distress; mood and affect are within normal limits.  A focused examination was performed including arms, legs, trunk, groin, scalp. Relevant physical exam findings are noted in the Assessment and Plan.  Objective  Left Hip (side) - Posterior: Firm pink/brown papulenodule with dimple sign.   Objective  Right Flank: Hyperpigmented patches trunk, legs, scalp       Objective  groin: Hyperpigmentation groin   Assessment & Plan  Dermatofibroma Left Hip (side) - Posterior Benign-appearing.  Observation.  Call clinic for new or changing lesions.  Recommend daily use of broad spectrum spf 30+ sunscreen to sun-exposed areas.   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 (versus other) Right Flank  Skin / nail biopsy Type of biopsy: tangential   Informed consent: discussed and consent obtained   Timeout: patient name, date of birth, surgical site, and procedure verified   Procedure prep:  Patient was prepped and draped in usual sterile fashion Prep type:  Isopropyl alcohol Anesthesia: the lesion was anesthetized in a standard fashion   Anesthetic:  1% lidocaine w/ epinephrine 1-100,000 buffered w/ 8.4% NaHCO3 Instrument used: flexible razor blade   Outcome: patient tolerated procedure well     Post-procedure details: sterile dressing applied and wound care instructions given   Dressing type: bandage and bacitracin    Specimen 1 - Surgical pathology Differential Diagnosis: D48.5 Tinea Versicolor vs Confluent and reticulate papillomatosis vs other  Check Margins: No Hyperpigmented patches trunk, legs  Tinea Versicolor vs Confluent and reticulate papillomatosis vs other bx today  Pt has been treating with Selenium sulfide shampoo ~40m with no improvemenr  Tinea cruris groin Tinea vs Intertrigo We will discuss treatment pending biopsy results   Return for prn pending bx results.   I, Othelia Pulling, RMA, am acting as scribe for Sarina Ser, MD .  Documentation: I have reviewed the above documentation for accuracy and completeness, and I agree with the above.  Sarina Ser, MD

## 2020-10-09 NOTE — Patient Instructions (Signed)

## 2020-10-10 ENCOUNTER — Encounter: Payer: Self-pay | Admitting: Dermatology

## 2020-10-11 ENCOUNTER — Other Ambulatory Visit: Payer: Self-pay | Admitting: Family Medicine

## 2020-10-16 ENCOUNTER — Telehealth: Payer: Self-pay

## 2020-10-16 MED ORDER — TERBINAFINE HCL 250 MG PO TABS: 250.0000 mg | ORAL_TABLET | Freq: Every day | ORAL | 0 refills | Status: DC

## 2020-10-16 NOTE — Telephone Encounter (Signed)
Patient advised of information per Dr. Nehemiah Massed. RX sent in and 6 week follow up appt scheduled.

## 2020-10-16 NOTE — Telephone Encounter (Signed)
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 Ask and document if any liver problems? I reviewed meds and see no contraindication to taking Lamisil Send Lamisil in (if no history of liver problem) 250 mg 1 po qd #30. Make appt for 5-6 weeks for re-check

## 2020-10-18 ENCOUNTER — Ambulatory Visit: Payer: Medicaid Other | Admitting: Family Medicine

## 2020-11-01 ENCOUNTER — Other Ambulatory Visit: Payer: Self-pay | Admitting: Cardiovascular Disease

## 2020-11-06 ENCOUNTER — Other Ambulatory Visit: Payer: Self-pay | Admitting: Family Medicine

## 2020-11-18 ENCOUNTER — Other Ambulatory Visit: Payer: Self-pay | Admitting: Dermatology

## 2020-11-27 ENCOUNTER — Ambulatory Visit (INDEPENDENT_AMBULATORY_CARE_PROVIDER_SITE_OTHER): Payer: Medicaid Other | Admitting: Dermatology

## 2020-11-27 ENCOUNTER — Other Ambulatory Visit: Payer: Self-pay

## 2020-11-27 DIAGNOSIS — B356 Tinea cruris: Secondary | ICD-10-CM | POA: Diagnosis not present

## 2020-11-27 DIAGNOSIS — B360 Pityriasis versicolor: Secondary | ICD-10-CM

## 2020-11-27 MED ORDER — KETOCONAZOLE 2 % EX SHAM: 1.0000 "application " | MEDICATED_SHAMPOO | CUTANEOUS | 3 refills | Status: DC

## 2020-11-27 MED ORDER — FLUCONAZOLE 200 MG PO TABS: 200.0000 mg | ORAL_TABLET | ORAL | 0 refills | Status: DC

## 2020-11-27 NOTE — Progress Notes (Signed)
   Follow-Up Visit   Subjective  [name] [name] is a 59 y.o. male who presents for the following: Tinea versicolor bx proven (trunk, Lamisil 250mg  1 po qd x 36m, pt doesnt feel like improving much) and Tinea Cruris vs Intertrigo (groin). The following portions of the chart were reviewed this encounter and updated as appropriate:  Tobacco  Allergies  Meds  Problems  Med Hx  Surg Hx  Fam Hx     Review of Systems:  No other skin or systemic complaints except as noted in HPI or Assessment and Plan.  Objective  Well appearing patient in no apparent distress; mood and affect are within normal limits.  A focused examination was performed including scalp, trunk, arms. Relevant physical exam findings are noted in the Assessment and Plan.  Objective  trunk, extremities, scalp: Hyperpigmented patches trunk, legs, scalp  Objective  groin: No exam today, pt states improving   Assessment & Plan  Tinea versicolor -persistent, not to goal trunk, extremities, scalp Biopsy proven Improving from 1 month of Lamisil  D/c Lamisil Start Diflucan 200mg  3d/wk x 37m #12 0rf Start Ketoconazole 2% wash 3d/wk wash from head to toe, let sit 5 minutes and rinse off  fluconazole (DIFLUCAN) 200 MG tablet - trunk, extremities, scalp  ketoconazole (NIZORAL) 2 % shampoo - trunk, extremities, scalp  Tinea cruris groin Vs Intertrigo Improved from oral Lamisil x 63m  Start Diflucan 200mg  3d/wk x 31m #12 0rf Start Ketoconazole 2% wash 3d/wk wash from head to toe, let sit 5 minutes and rinse off  Return in about 2 months (around 01/28/2021) for Tinea versicolor.  I, Othelia Pulling, RMA, am acting as scribe for Sarina Ser, MD .  Documentation: I have reviewed the above documentation for accuracy and completeness, and I agree with the above.  Sarina Ser, MD

## 2020-12-02 ENCOUNTER — Encounter: Payer: Self-pay | Admitting: Dermatology

## 2020-12-13 ENCOUNTER — Other Ambulatory Visit: Payer: Self-pay | Admitting: Family Medicine

## 2020-12-13 DIAGNOSIS — R809 Proteinuria, unspecified: Secondary | ICD-10-CM

## 2020-12-13 DIAGNOSIS — E1140 Type 2 diabetes mellitus with diabetic neuropathy, unspecified: Secondary | ICD-10-CM

## 2020-12-15 ENCOUNTER — Other Ambulatory Visit: Payer: Self-pay | Admitting: Family Medicine

## 2020-12-15 DIAGNOSIS — R809 Proteinuria, unspecified: Secondary | ICD-10-CM

## 2020-12-15 NOTE — Telephone Encounter (Signed)
Requested Prescriptions  Pending Prescriptions Disp Refills  . TRULICITY 1.5 HE/0.3TC SOPN [Pharmacy Med Name: TRULICITY 1.5MG /0.5ML SDP 0.5ML] 2 mL 2    Sig: ADMINISTER 1.5 MG UNDER THE SKIN 1 TIME A WEEK     Endocrinology:  Diabetes - GLP-1 Receptor Agonists Passed - 12/15/2020 11:25 AM      Passed - HBA1C is between 0 and 7.9 and within 180 days    Hemoglobin A1C  Date Value Ref Range Status  09/26/2020 5.3 4.0 - 5.6 % Final   HbA1c, POC (controlled diabetic range)  Date Value Ref Range Status  01/29/2020 6.7 0.0 - 7.0 % Final         Passed - Valid encounter within last 6 months    Recent Outpatient Visits          2 months ago Controlled type 2 diabetes mellitus with microalbuminuria, without long-term current use of insulin [facility])   [facility] [place], Drue Stager, MD   4 months ago Obesity (BMI 30.0-34.9)   [facility] [place], Drue Stager, MD   6 months ago Controlled type 2 diabetes mellitus with microalbuminuria, without long-term current use of insulin [facility])   [facility] [place], Drue Stager, MD   10 months ago Controlled type 2 diabetes mellitus with microalbuminuria, without long-term current use of insulin [facility])   [facility] [place], Drue Stager, MD   1 year ago Controlled type 2 diabetes mellitus with microalbuminuria, without long-term current use of insulin [facility])   [facility] Steele Sizer, MD      Future Appointments            In 4 days Steele Sizer, MD [facility] [place]   In 1 month Ralene Bathe, MD [place]

## 2020-12-17 ENCOUNTER — Other Ambulatory Visit: Payer: Self-pay | Admitting: Family Medicine

## 2020-12-17 DIAGNOSIS — R809 Proteinuria, unspecified: Secondary | ICD-10-CM

## 2020-12-17 NOTE — Progress Notes (Signed)
Name: [name] [name]   MRN: [id number]    DOB: 04-Aug-1961   Date:12/19/2020       Progress Note  Subjective  Chief Complaint  Annual Exam   HPI   DMII:A1C was down to 6.2%,6.6%,went up to 6.7%,  6 % and now is 5.3% He is off Lantus, Trajenta and Metformin , currently on Trulicity and Farxiga, and A1C is 5.5 % we will try going down on Farxiga and try to wean him off medication. FSBS at home has been 101-135  He has changed his diet, avoiding Gatorade, only drinking water.. No polyphagia, polydipsia or polyuria.  Constipation:doing well on Amitiza, down to once daily pill  , Bristol scale of 4 , daily, he is under the care of Dr. Marius Ditch   HTN/Angina : taking medication,bp has been under control at home highest is 130's/ high 80's  he denies chest pain or palpitation at this time. BP at home has been 120's/80's.No side effects of medication . Angina is controlled at this time, he stopped imdur because it was causing hypotension. He took NTG twice this month, he states symptoms resolved quickly with medication   Hyperlipidemia: compliant with medication,reviewed last labs , LDL at goal it was 68, at goal , we will recheck labs today   Seizure: no recent episodes,he has seen Dr. Manuella Ghazi, he has noticed some dizziness a few times a week. He has a history of concussion, he does not want to go back to Dr. Manuella Ghazi at this time. He knows when it will happen, he feels tired before the episode   OSA: he is wearing CPAPat least 6 times a week, he wears all night , doing well   Rash: recurrent, spots are on his back, seen by Dr. Nehemiah Massed and is taking Diflucan and lamisil and spots are clearing up   Sciatica: used to see Dr. Phyllis Ginger for neck problems, now having right lower back pain, that radiates to posterior thigh. He does not want to have injections because glucose went up . No bowel or bladder incontinence.He would like to try lyrica   Obesity: BMI is below 35 now Hehas increased  physical activity, three times a week walking at the mall.Taking Trulicity and it curbs his appetite    Patient Active Problem List   Diagnosis Date Noted  . Localized, primary osteoarthritis of hand 09/28/2019  . Seizure ([place]) 02/22/2018  . Mild cognitive impairment 11/17/2017  . Radiculopathy of cervical region 11/17/2017  . Stenosis of carotid artery 09/08/2017  . Cervical stenosis of spine 09/08/2017  . B12 deficiency 06/28/2017  . Headache disorder 02/15/2017  . Memory loss or impairment 02/15/2017  . Coronary artery disease involving native coronary artery of native heart   . Radiculitis of left cervical region 03/05/2016  . Diabetic neuropathy associated with type 2 diabetes mellitus ([place]) 12/05/2015  . Patellar subluxation 06/05/2015  . Allergic rhinitis, seasonal 06/05/2015  . Chronic constipation 06/05/2015  . Chronic lower back pain 06/05/2015  . Morbid obesity due to excess calories ([place]) 06/05/2015  . Vitamin D deficiency 06/05/2015  . Central sleep apnea 06/05/2015  . 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 Nerve root pain 06/05/2015  . Acquired polycythemia 06/05/2015  . Anterior knee pain 06/05/2015  . Dysmetabolic syndrome 47/42/5956  . Failure of erection 06/05/2015  . Gastro-esophageal reflux disease without esophagitis 06/05/2015  . Neuropathy 06/05/2015  . CAD (coronary artery disease)   . Angina, class III ([place]) 05/23/2015  . Essential hypertension 05/23/2015  . Hyperlipidemia 05/23/2015  .  Inguinal hernia without mention of obstruction or gangrene, recurrent unilateral or unspecified 03/24/2013    Past Surgical History:  Procedure Laterality Date  . BACK SURGERY  12/2008   X2-LUMBAR  . CARDIAC CATHETERIZATION N/A 05/30/2015   Procedure: Left Heart Cath;  Surgeon: Wellington Hampshire, MD;  Location: [facility];  Service: Cardiovascular;  Laterality: N/A;  . CARDIAC CATHETERIZATION N/A 04/16/2016   Procedure: Left Heart Cath and Coronary Angiography;   Surgeon: Wellington Hampshire, MD;  Location: [facility];  Service: Cardiovascular;  Laterality: N/A;  . COLONOSCOPY  2014   Dr. [name]  . COLONOSCOPY WITH PROPOFOL N/A 12/25/2016   Procedure: COLONOSCOPY WITH PROPOFOL;  Surgeon: Jonathon Bellows, MD;  Location: [facility];  Service: Endoscopy;  Laterality: N/A;  . COLONOSCOPY WITH PROPOFOL N/A 01/29/2017   Procedure: COLONOSCOPY WITH PROPOFOL;  Surgeon: Jonathon Bellows, MD;  Location: [facility] ENDOSCOPY;  Service: Endoscopy;  Laterality: N/A;  . COLONOSCOPY WITH PROPOFOL N/A 04/11/2020   Procedure: COLONOSCOPY WITH PROPOFOL;  Surgeon: Jonathon Bellows, MD;  Location: [facility] ENDOSCOPY;  Service: Gastroenterology;  Laterality: N/A;  . ESOPHAGOGASTRODUODENOSCOPY (EGD) WITH PROPOFOL N/A 01/26/2019   Procedure: ESOPHAGOGASTRODUODENOSCOPY (EGD) WITH PROPOFOL;  Surgeon: Lin Landsman, MD;  [facility] END[facility]COPY;  Service: Gastroenterology;  Laterality: N/A;  . EVALUATION UNDER ANESTHESIA WITH HEMORRHOIDECTOMY N/A 02/24/2017   Procedure: EXAM UNDER ANESTHESIA WITH POSSIBLE EXCISION OF INTERNAL HEMORRHOIDS;  Surgeon: Olean Ree, MD;  Location: [facility] ORS;  Service: General;  Laterality: N/A;  . FISSURECTOMY  02/24/2017   Procedure: FISSURECTOMY;  Surgeon: Olean Ree, MD;  Location: [facility];  Service: General;;  . HAND SURGERY Left 2020  . HERNIA REPAIR Right 1991  . INGUINAL HERNIA REPAIR Right 2012   Dr [name]  . INGUINAL HERNIA REPAIR Right 2014   Dr [name]  . LEFT HEART CATH AND CORONARY ANGIOGRAPHY Left 08/05/2020   Procedure: LEFT HEART CATH AND CORONARY ANGIOGRAPHY poss PCI;  Surgeon: Wellington Hampshire, MD;  Location: [facility];  Service: Cardiovascular;  Laterality: Left;    Family History  Problem Relation Age of Onset  . Heart Problems Mother   . Heart Problems Father 67       myocardial infarction  . Mental illness Brother   . Testicular cancer Paternal Uncle   . Other Maternal Aunt        COVID  . Breast cancer Cousin      Social History   Tobacco Use  . Smoking status: Former Smoker    Years: 2.00    Types: Cigars    Start date: 12/28/2002    Quit date: 06/04/2005    Years since quitting: 15.5  . Smokeless tobacco: Never Used  Substance Use Topics  . Alcohol use: Yes    Alcohol/week: 1.0 standard drink    Types: 1 Glasses of wine per week    Comment: RARE BEER     Current Outpatient Medications:  .  ACCU-CHEK GUIDE test strip, TEST BLOOD SUGAR UP TO TWICE DAILY, Disp: 100 strip, Rfl: 1 .  Accu-Chek Softclix Lancets lancets, TEST TWICE DAILY, Disp: 100 each, Rfl: 3 .  aspirin 81 MG tablet, Take 81 mg by mouth daily., Disp: , Rfl:  .  atorvastatin (LIPITOR) 80 MG tablet, Take 1 tablet (80 mg total) by mouth daily., Disp: 90 tablet, Rfl: 1 .  blood glucose meter kit and supplies, 1 each by Other route in the morning and at bedtime. Dispense based on patient and insurance preference. Use  up to two  times daily. (FOR ICD-10 E10.9, E11.9)., Disp: 1 each, Rfl: 0 .  carvedilol (COREG) 6.25 MG tablet, Take 1 tablet (6.25 mg total) by mouth 2 (two) times daily., Disp: 180 tablet, Rfl: 0 .  cetirizine (ZYRTEC) 10 MG tablet, Take 10 mg by mouth daily as needed for allergies. , Disp: , Rfl: 1 .  dicyclomine (BENTYL) 10 MG capsule, TAKE 1 CAPSULE BY MOUTH THREE TIMES A DAY BEFORE MEALS (Patient taking differently: Take 10 mg by mouth 3 (three) times daily before meals.), Disp: 90 capsule, Rfl: 0 .  ezetimibe (ZETIA) 10 MG tablet, Take 1 tablet (10 mg total) by mouth daily., Disp: 90 tablet, Rfl: 1 .  FARXIGA 10 MG TABS tablet, TAKE 1 TABLET(10 MG) BY MOUTH DAILY BEFORE BREAKFAST, Disp: 90 tablet, Rfl: 2 .  fluconazole (DIFLUCAN) 200 MG tablet, Take 1 tablet (200 mg total) by mouth 3 (three) times a week., Disp: 12 tablet, Rfl: 0 .  fluticasone (FLONASE) 50 MCG/ACT nasal spray, Place 2 sprays into the nose daily as needed for allergies or rhinitis. , Disp: , Rfl:  .  ketoconazole (NIZORAL) 2 % shampoo, Apply 1  application topically 3 (three) times a week. Wash scalp/body 3 times a week, let sit 5 minutes before rinsing off, avoid eyes, Disp: 120 mL, Rfl: 3 .  losartan-hydrochlorothiazide (HYZAAR) 50-12.5 MG tablet, TAKE 1 TABLET BY MOUTH DAILY, Disp: 90 tablet, Rfl: 0 .  lubiprostone (AMITIZA) 24 MCG capsule, TAKE 1 CAPSULE(24 MCG) BY MOUTH TWICE DAILY WITH A MEAL (Patient taking differently: Take 24 mcg by mouth 2 (two) times daily with a meal.), Disp: 60 capsule, Rfl: 0 .  NITROSTAT 0.4 MG SL tablet, place 1 tablet under the tongue every 5 minutes for UP TO 3 doses if needed for angina as directed by prescriber (Patient taking differently: Place 0.4 mg under the tongue every 5 (five) minutes as needed for chest pain.), Disp: 25 tablet, Rfl: 1 .  ranolazine (RANEXA) 1000 MG SR tablet, TAKE 1 TABLET(1000 MG) BY MOUTH TWICE DAILY (Patient taking differently: Take 1,000 mg by mouth 2 (two) times daily.), Disp: 180 tablet, Rfl: 3 .  terbinafine (LAMISIL) 250 MG tablet, Take 1 tablet (250 mg total) by mouth daily., Disp: 30 tablet, Rfl: 0 .  TRULICITY 1.5 DJ/2.4QA SOPN, ADMINISTER 1.5 MG UNDER THE SKIN 1 TIME A WEEK, Disp: 2 mL, Rfl: 2 .  isosorbide mononitrate (IMDUR) 30 MG 24 hr tablet, Take 0.5 tablets (15 mg total) by mouth daily., Disp: 45 tablet, Rfl: 3  Allergies  Allergen Reactions  . Gabapentin Other (See Comments)    Groggy-Mood Changes  . Latex Rash  . Penicillins Hives and Rash    Has patient had a PCN reaction causing immediate rash, facial/tongue/throat swelling, SOB or lightheadedness with hypotension: Yes Has patient had a PCN reaction causing severe rash involving mucus membranes or skin necrosis: No Has patient had a PCN reaction that required hospitalization No Has patient had a PCN reaction occurring within the last 10 years: No If all of the above answers are "NO", then may proceed with Cephalosporin use.     I personally reviewed active problem list, medication list, allergies,  family history, social history, health maintenance with the patient/caregiver today.   ROS  Constitutional: Negative for fever or weight change.  Respiratory: Negative for cough and shortness of breath.   Cardiovascular: Negative for chest pain or palpitations.  Gastrointestinal: Negative for abdominal pain, no bowel changes.  Musculoskeletal: Negative for  gait problem or joint swelling.  Skin: Negative for rash.  Neurological: Negative for dizziness or headache.  No other specific complaints in a complete review of systems (except as listed in HPI above).  Objective  Vitals:   12/19/20 0852  BP: 128/74  Pulse: 95  Resp: 16  Temp: 98.1 F (36.7 C)  TempSrc: Oral  SpO2: 98%  Weight: 224 lb 11.2 oz (101.9 kg)  Height: _0  (1.803 m)    Body mass index is 31.34 kg/m.  Physical Exam  Constitutional: Patient appears well-developed and well-nourished. Obese  No distress.  HEENT: head atraumatic, normocephalic, pupils equal and reactive to light, , neck supple Cardiovascular: Normal rate, regular rhythm and normal heart sounds.  No murmur heard. No BLE edema. Pulmonary/Chest: Effort normal and breath sounds normal. No respiratory distress. Abdominal: Soft.  There is no tenderness. Psychiatric: Patient has a normal mood and affect. behavior is normal. Judgment and thought content normal.  Recent Results (from the past 2160 hour(s))  POCT HgB A1C     Status: Normal   Collection Time: 09/26/20 11:00 AM  Result Value Ref Range   Hemoglobin A1C 5.3 4.0 - 5.6 %   HbA1c POC (<> result, manual entry)     HbA1c, POC (prediabetic range)     HbA1c, POC (controlled diabetic range)    CBC with Differential/Platelet     Status: Abnormal   Collection Time: 09/26/20 12:04 PM  Result Value Ref Range   WBC 10.4 3.8 - 10.8 Thousand/uL   RBC 5.50 4.20 - 5.80 Million/uL   Hemoglobin 17.8 (H) 13.2 - 17.1 g/dL   HCT 51.9 (H) 38.5 - 50.0 %   MCV 94.4 80.0 - 100.0 fL   MCH 32.4 27.0 - 33.0  pg   MCHC 34.3 32.0 - 36.0 g/dL   RDW 12.9 11.0 - 15.0 %   Platelets 252 140 - 400 Thousand/uL   MPV 9.5 7.5 - 12.5 fL   Neutro Abs 5,678 1,500 - 7,800 cells/uL   Lymphs Abs 3,723 850 - 3,900 cells/uL   Absolute Monocytes 884 200 - 950 cells/uL   Eosinophils Absolute 73 15 - 500 cells/uL   Basophils Absolute 42 0 - 200 cells/uL   Neutrophils Relative % 54.6 %   Total Lymphocyte 35.8 %   Monocytes Relative 8.5 %   Eosinophils Relative 0.7 %   Basophils Relative 0.4 %  COMPLETE METABOLIC PANEL WITH GFR     Status: Abnormal   Collection Time: 09/26/20 12:04 PM  Result Value Ref Range   Glucose, Bld 80 65 - 99 mg/dL    Comment: .            Fasting reference interval .    BUN 13 7 - 25 mg/dL   Creat 0.92 0.70 - 1.33 mg/dL    Comment: For patients >36 years of age, the reference limit for Creatinine is approximately 13% higher for people identified as African-American. .    GFR, Est Non African American 91 > OR = 60 mL/min/1.53m   GFR, Est African American 106 > OR = 60 mL/min/1.777m  BUN/Creatinine Ratio NOT APPLICABLE 6 - 22 (calc)   Sodium 139 135 - 146 mmol/L   Potassium 4.3 3.5 - 5.3 mmol/L   Chloride 100 98 - 110 mmol/L   CO2 26 20 - 32 mmol/L   Calcium 10.0 8.6 - 10.3 mg/dL   Total Protein 7.6 6.1 - 8.1 g/dL   Albumin 4.7 3.6 - 5.1 g/dL  Globulin 2.9 1.9 - 3.7 g/dL (calc)   AG Ratio 1.6 1.0 - 2.5 (calc)   Total Bilirubin 0.7 0.2 - 1.2 mg/dL   Alkaline phosphatase (APISO) 72 35 - 144 U/L   AST 75 (H) 10 - 35 U/L   ALT 49 (H) 9 - 46 U/L  Lipid panel     Status: None   Collection Time: 09/26/20 12:04 PM  Result Value Ref Range   Cholesterol 134 <200 mg/dL   HDL 46 > OR = 40 mg/dL   Triglycerides 121 <150 mg/dL   LDL Cholesterol (Calc) 68 mg/dL (calc)    Comment: Reference range: <100 . Desirable range <100 mg/dL for primary prevention;   <70 mg/dL for patients with CHD or diabetic patients  with > or = 2 CHD risk factors. Marland Kitchen LDL-C is now calculated using  the Martin-Hopkins  calculation, which is a validated novel method providing  better accuracy than the Friedewald equation in the  estimation of LDL-C.  Cresenciano Genre et al. Annamaria Helling. 9622;297(98): 2061-2068  (http://education.[facility].com/faq/FAQ164)    Total CHOL/HDL Ratio 2.9 <5.0 (calc)   Non-HDL Cholesterol (Calc) 88 <130 mg/dL (calc)    Comment: For patients with diabetes plus 1 major ASCVD risk  factor, treating to a non-HDL-C goal of <100 mg/dL  (LDL-C of <70 mg/dL) is considered a therapeutic  option.   Microalbumin / creatinine urine ratio     Status: Abnormal   Collection Time: 09/26/20 12:04 PM  Result Value Ref Range   Creatinine, Urine 426 (H) 20 - 320 mg/dL    Comment: Verified by repeat analysis. .    Microalb, Ur 91.6 mg/dL    Comment: Verified by repeat analysis. Marland Kitchen Reference Range Not established    Microalb Creat Ratio 215 (H) <30 mcg/mg creat    Comment: . The ADA defines abnormalities in albumin excretion as follows: Marland Kitchen Albuminuria Category        Result (mcg/mg creatinine) . Normal to Mildly increased   <30 Moderately increased         30-299  Severely increased           > OR = 300 . The ADA recommends that at least two of three specimens collected within a 3-6 month period be abnormal before considering a patient to be within a diagnostic category.   POCT HgB A1C     Status: Normal   Collection Time: 12/19/20  8:59 AM  Result Value Ref Range   Hemoglobin A1C 5.5 4.0 - 5.6 %   HbA1c POC (<> result, manual entry)     HbA1c, POC (prediabetic range)     HbA1c, POC (controlled diabetic range)        PHQ2/9: Depression screen [facility] 2/9 12/19/2020 09/26/2020 08/14/2020 06/07/2020 01/29/2020  Decreased Interest 0 0 0 0 0  Down, Depressed, Hopeless 0 0 0 0 0  PHQ - 2 Score 0 0 0 0 0  Altered sleeping 0 - 0 0 0  Tired, decreased energy 3 - 0 0 0  Change in appetite 0 - 0 0 0  Feeling bad or failure about yourself  0 - 0 0 0  Trouble concentrating 0  - 0 0 0  Moving slowly or fidgety/restless 0 - 0 0 0  Suicidal thoughts 0 - 0 0 0  PHQ-9 Score 3 - 0 0 0  Difficult doing work/chores - - - - Not difficult at all  Some recent data might be hidden    phq 9 is negative  Fall Risk: Fall Risk  12/19/2020 09/26/2020 08/14/2020 06/07/2020 01/29/2020  Falls in the past year? 0 1 0 1 0  Number falls in past yr: 0 0 0 1 0  Injury with Fall? 0 0 0 1 0  Comment - - - - -  Risk for fall due to : - History of fall(s) - - -  Follow up - - - - -     Functional Status Survey: Is the patient deaf or have difficulty hearing?: Yes Does the patient have difficulty seeing, even when wearing glasses/contacts?: No Does the patient have difficulty concentrating, remembering, or making decisions?: Yes Does the patient have difficulty walking or climbing stairs?: Yes Does the patient have difficulty dressing or bathing?: No Does the patient have difficulty doing errands alone such as visiting a doctor's office or shopping?: No    Assessment & Plan  1. Controlled type 2 diabetes mellitus with microalbuminuria, without long-term current use of insulin (HCC)  - POCT HgB A1C - dapagliflozin propanediol (FARXIGA) 5 MG TABS tablet; Take 1 tablet (5 mg total) by mouth daily. New dose  Dispense: 90 tablet; Refill: 0  2. Essential hypertension  - carvedilol (COREG) 6.25 MG tablet; Take 1 tablet (6.25 mg total) by mouth 2 (two) times daily.  Dispense: 180 tablet; Refill: 1  3. Seizure ([place])   4. Controlled type 2 diabetes with neuropathy ([place])   5. Hypertension associated with diabetes ([place])   6. Coronary artery disease due to lipid rich plaque   7. Dyslipidemia associated with type 2 diabetes mellitus ([place])   8. Coronary artery disease of native artery of native heart with stable angina pectoris (HCC)   9. Obesity (BMI 30.0-34.9)  Doing well with life style modification  10. Sciatica of right side  - pregabalin (LYRICA) 50 MG capsule;  Take 1 capsule (50 mg total) by mouth 3 (three) times daily.  Dispense: 90 capsule; Refill: 0

## 2020-12-19 ENCOUNTER — Encounter: Payer: Self-pay | Admitting: Family Medicine

## 2020-12-19 ENCOUNTER — Ambulatory Visit (INDEPENDENT_AMBULATORY_CARE_PROVIDER_SITE_OTHER): Payer: Medicaid Other | Admitting: Family Medicine

## 2020-12-19 ENCOUNTER — Other Ambulatory Visit: Payer: Self-pay

## 2020-12-19 VITALS — BP 128/74 | HR 95 | Temp 98.1°F | Resp 16 | Ht 71.0 in | Wt 224.7 lb

## 2020-12-19 DIAGNOSIS — E785 Hyperlipidemia, unspecified: Secondary | ICD-10-CM

## 2020-12-19 DIAGNOSIS — E1129 Type 2 diabetes mellitus with other diabetic kidney complication: Secondary | ICD-10-CM

## 2020-12-19 DIAGNOSIS — E1159 Type 2 diabetes mellitus with other circulatory complications: Secondary | ICD-10-CM

## 2020-12-19 DIAGNOSIS — I2510 Atherosclerotic heart disease of native coronary artery without angina pectoris: Secondary | ICD-10-CM | POA: Diagnosis not present

## 2020-12-19 DIAGNOSIS — I152 Hypertension secondary to endocrine disorders: Secondary | ICD-10-CM

## 2020-12-19 DIAGNOSIS — I2583 Coronary atherosclerosis due to lipid rich plaque: Secondary | ICD-10-CM

## 2020-12-19 DIAGNOSIS — R569 Unspecified convulsions: Secondary | ICD-10-CM

## 2020-12-19 DIAGNOSIS — E669 Obesity, unspecified: Secondary | ICD-10-CM | POA: Diagnosis not present

## 2020-12-19 DIAGNOSIS — M5431 Sciatica, right side: Secondary | ICD-10-CM

## 2020-12-19 DIAGNOSIS — I10 Essential (primary) hypertension: Secondary | ICD-10-CM | POA: Diagnosis not present

## 2020-12-19 DIAGNOSIS — E1140 Type 2 diabetes mellitus with diabetic neuropathy, unspecified: Secondary | ICD-10-CM | POA: Diagnosis not present

## 2020-12-19 DIAGNOSIS — I25118 Atherosclerotic heart disease of native coronary artery with other forms of angina pectoris: Secondary | ICD-10-CM

## 2020-12-19 DIAGNOSIS — E1169 Type 2 diabetes mellitus with other specified complication: Secondary | ICD-10-CM

## 2020-12-19 DIAGNOSIS — R809 Proteinuria, unspecified: Secondary | ICD-10-CM

## 2020-12-19 LAB — POCT GLYCOSYLATED HEMOGLOBIN (HGB A1C): Hemoglobin A1C: 5.5 % (ref 4.0–5.6)

## 2020-12-19 MED ORDER — CARVEDILOL 6.25 MG PO TABS
6.2500 mg | ORAL_TABLET | Freq: Two times a day (BID) | ORAL | 1 refills | Status: DC
Start: 2020-12-19 — End: 2021-07-21

## 2020-12-19 MED ORDER — PREGABALIN 50 MG PO CAPS: 50.0000 mg | ORAL_CAPSULE | Freq: Three times a day (TID) | ORAL | 0 refills | Status: DC

## 2020-12-19 MED ORDER — DAPAGLIFLOZIN PROPANEDIOL 5 MG PO TABS: 5.0000 mg | ORAL_TABLET | Freq: Every day | ORAL | 0 refills | Status: DC

## 2020-12-21 ENCOUNTER — Other Ambulatory Visit: Payer: Self-pay | Admitting: Family Medicine

## 2020-12-21 ENCOUNTER — Other Ambulatory Visit: Payer: Self-pay | Admitting: Cardiovascular Disease

## 2020-12-21 DIAGNOSIS — E782 Mixed hyperlipidemia: Secondary | ICD-10-CM

## 2020-12-21 DIAGNOSIS — I10 Essential (primary) hypertension: Secondary | ICD-10-CM

## 2020-12-23 NOTE — Telephone Encounter (Signed)
Rx request sent to pharmacy.  

## 2020-12-26 ENCOUNTER — Other Ambulatory Visit: Payer: Self-pay | Admitting: Dermatology

## 2020-12-26 DIAGNOSIS — B360 Pityriasis versicolor: Secondary | ICD-10-CM

## 2020-12-30 ENCOUNTER — Encounter: Payer: Self-pay | Admitting: Dermatology

## 2020-12-30 ENCOUNTER — Encounter: Payer: Self-pay | Admitting: Family Medicine

## 2020-12-30 DIAGNOSIS — Z20822 Contact with and (suspected) exposure to covid-19: Secondary | ICD-10-CM | POA: Diagnosis not present

## 2020-12-30 NOTE — Telephone Encounter (Signed)
Patient advised that Diflucan was only to be used for one month, but he should continue his other topical medications until his follow up appointment.

## 2021-01-01 ENCOUNTER — Encounter: Payer: Self-pay | Admitting: Family Medicine

## 2021-01-01 ENCOUNTER — Ambulatory Visit: Payer: Medicaid Other | Admitting: Nurse Practitioner

## 2021-01-01 LAB — SARS-COV-2, NAA 2 DAY TAT

## 2021-01-01 LAB — NOVEL CORONAVIRUS, NAA: SARS-CoV-2, NAA: NOT DETECTED

## 2021-01-01 LAB — SPECIMEN STATUS REPORT

## 2021-01-06 ENCOUNTER — Other Ambulatory Visit: Payer: Self-pay | Admitting: Emergency Medicine

## 2021-01-06 DIAGNOSIS — Z20822 Contact with and (suspected) exposure to covid-19: Secondary | ICD-10-CM

## 2021-01-07 DIAGNOSIS — Z20822 Contact with and (suspected) exposure to covid-19: Secondary | ICD-10-CM | POA: Diagnosis not present

## 2021-01-08 ENCOUNTER — Ambulatory Visit: Payer: Medicaid Other | Admitting: Family

## 2021-01-09 LAB — SARS-COV-2, NAA 2 DAY TAT

## 2021-01-09 LAB — NOVEL CORONAVIRUS, NAA: SARS-CoV-2, NAA: NOT DETECTED

## 2021-01-09 LAB — SPECIMEN STATUS REPORT

## 2021-01-13 ENCOUNTER — Ambulatory Visit: Payer: Medicaid Other | Admitting: Dermatology

## 2021-01-13 NOTE — Progress Notes (Signed)
Office Visit    Patient Name: [name] [name] Date of Encounter: 01/21/2021  Primary Care Provider:  Steele Sizer, MD Primary Cardiologist:  [name], MD Electrophysiologist:  None   Chief Complaint    [name] [name] is a 60 y.o. 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 Allergy   . Angina, class III ([place])   . Chronic constipation   . Degeneration of intervertebral disc of cervical region   . Diabetes mellitus without complication ([place])   . Diastolic dysfunction    a. 06/2017 Echo: EF 60-65%, no rwma, Gr1 DD.  Marland Kitchen Dyspnea   . GERD (gastroesophageal reflux disease)   . Hernia 1991   02/10/2012-RIH repair  . Hyperlipidemia   . Hypertension 2008  . Nerve root pain   . Neuropathy   . Non-obstructive CAD (coronary artery disease)    a. 05/30/2015 cath: LM nl, mLAD 50% (FFR 0.83), LCx minor irregs, RCA minor irregs, EF 55-65%-->Med Rx; b. 03/2016 Cath: LM nl LAD 110m D1/2/3 min irregs, LCX min irregs, OM1/2/3 nl, RCA min irregs, RPDA/RPAV/RPL1/RPL2 nl-->Med Rx; b. 07/2020 Cath: LM nl, LAD 35mD1/2/3 min irregs, LCX min irregs, RCA min irregs, RPDA/RPAV/RPL1,2 nl, EF 55-65%.  . Obesity, unspecified 2012  . Personal history of tobacco use, presenting hazards to health 2012  . Polycythemia   . Rectal bleeding 02/11/2017  . Recurrent Right Inguinal Hernia Repair 02/09/2011, 04/07/2013   [facility]. Sleep apnea    a. On CPAP;  b. 06/2017 Echo: no PAH.  . Marland Kitchenmbilical hernia without mention of obstruction or gangrene 02/09/2011   [facility]. Vitamin D deficiency    Past Surgical History:  Procedure Laterality Date  . BACK SURGERY  12/2008   X2-LUMBAR  . CARDIAC CATHETERIZATION N/A 05/30/2015   Procedure: Left Heart Cath;  Surgeon:  MuWellington HampshireMD;  [place];  Service: Cardiovascular;  Laterality: N/A;  . CARDIAC CATHETERIZATION N/A 04/16/2016   Procedure: Left Heart Cath and Coronary Angiography;  Surgeon: MuWellington HampshireMD;  [place];  Service: Cardiovascular;  Laterality: N/A;  . COLONOSCOPY  2014   Dr. SaJamal Collin. COLONOSCOPY WITH PROPOFOL N/A 12/25/2016   Procedure: COLONOSCOPY WITH PROPOFOL;  Surgeon: KiJonathon BellowsMD;  Location: [facility];  Service: Endoscopy;  Laterality: N/A;  . COLONOSCOPY WITH PROPOFOL N/A 01/29/2017   Procedure: COLONOSCOPY WITH PROPOFOL;  Surgeon: KiJonathon BellowsMD;  Location: [facility]COPY;  Service: Endoscopy;  Laterality: N/A;  . COLONOSCOPY WITH PROPOFOL N/A 04/11/2020   Procedure: COLONOSCOPY WITH PROPOFOL;  Surgeon: AnJonathon BellowsMD;  [facility];  Service: Gastroenterology;  Laterality: N/A;  . ESOPHAGOGASTRODUODENOSCOPY (EGD) WITH PROPOFOL N/A 01/26/2019   Procedure: ESOPHAGOGASTRODUODENOSCOPY (EGD) WITH PROPOFOL;  Surgeon: VaLin LandsmanMD;  [facility];  Service: Gastroenterology;  Laterality: N/A;  . EVALUATION UNDER ANESTHESIA WITH HEMORRHOIDECTOMY N/A 02/24/2017   Procedure: EXAM UNDER ANESTHESIA WITH POSSIBLE EXCISION OF INTERNAL HEMORRHOIDS;  Surgeon: JoOlean ReeMD;  Location: [facility] ORS;  Service: General;  Laterality: N/A;  . FISSURECTOMY  02/24/2017   Procedure: FISSURECTOMY;  Surgeon: JoOlean ReeMD;  Location: [facility] ORS;  Service: General;;  . HAND SURGERY Left 2020  . HERNIA REPAIR Right 1991  . INGUINAL  HERNIA REPAIR Right 2012   Dr [name]  . INGUINAL HERNIA REPAIR Right 2014   Dr [name]  . LEFT HEART CATH AND CORONARY ANGIOGRAPHY Left 08/05/2020   Procedure: LEFT HEART CATH AND CORONARY ANGIOGRAPHY poss PCI;  Surgeon: Wellington Hampshire, MD;  Location: [place] CV LAB;  Service: Cardiovascular;  Laterality: Left;    Allergies  Allergies  Allergen Reactions  . Gabapentin Other (See  Comments)    Groggy-Mood Changes  . Latex Rash  . Penicillins Hives and Rash    Has patient had a PCN reaction causing immediate rash, facial/tongue/throat swelling, SOB or lightheadedness with hypotension: Yes Has patient had a PCN reaction causing severe rash involving mucus membranes or skin necrosis: No Has patient had a PCN reaction that required hospitalization No Has patient had a PCN reaction occurring within the last 10 years: No If all of the above answers are "NO", then may proceed with Cephalosporin use.     History of Present Illness    [name] [name] is a 60 y.o. male with a hx of mild to moderate one-vessel coronary artery disease with suspected endothelial dysfunction, DM2, hypertension, hyperlipidemia, obesity, OSA on CPAP, syncope  last seen 09/26/20 by Dr. Fletcher Anon.  Cardiac catheterization 05/2015 with moderate mod LAD stenosis (50%) with FFR 0.83. Normal LVEF with normal LVEDP. Due to worsening anginal 03/2016 underwent repeat cardiac cath with improvement in mid-LAD stenosis to 30% and no evidence of obstructive disease.   He had recurrent episodes of loss of consciousness in 1224 of uncertain etiology. Echo at that time with normal LVEF. 2 week ZIO monitor with no significant arrhytmia. Evaluated by neurology as well with no clear etiology. He did have recurrent concussions while growing up playing sports.  He had recurrent angina 07/2017 with cath one vessel CAD of 30% stenosis to mis LAD. EF normal with mildly elevated LVEDP. He was treated medically with low dose Imdur.   He presents today for dyspnea on exertion, lightheadedness, near syncope, heart pounding. Tells me 2 months ago he was walking 2 miles and now he is is having dyspnea on exertion even with walking to his mailbox.  This is gradually worsening.  He notes some sensation of heart pounding both at rest and with activity.  He reports intermittent lightheadedness and shares with me that he had a near-syncopal  episode during Christmas. Tells me he was able to sit down and get the sensation to stop.   Has 15 grandkids that he wants to spend more time with and shares with me that this is why he is focusing on finding out what is going on.  He is wearing his CPAP regularly at night. No orthopnea nor PND. Tells me he has worn his CPAP during the day time to try to get a deep breath when he feels dyspneic  He checks his blood pressure routinely and tells me that his readings are routinely 90s to 100s in the morning.  It is never higher than 130s.  His primary care provider recently discontinued his Metformin to prevent hypoglycemia.  He is maintained on [place] and Trulicity.  Reports no blood glucose reading in the 70s or 80s.  EKGs/Labs/Other Studies Reviewed:   The following studies were reviewed today:  LHC 08/05/20  Mid LAD lesion is 30% stenosed.  The left ventricular systolic function is normal.  LV end diastolic pressure is mildly elevated.  The left ventricular ejection fraction is 55-65% by visual estimate.   1.  Mild  nonobstructive one-vessel coronary artery disease.  Mid LAD stenosis does not appear to be any worse since most recent catheterization. 2.  Normal LV systolic function and mildly elevated left ventricular end-diastolic pressure.   Recommendations: Continue medical therapy for stable angina which does not seem to be due to obstructive coronary artery disease.  Monitor 03/22/18 Normal sinus rhythm. Average heart rate was 74 bpm. Rare PVCs and PACs. Overall, no significant arrhythmia  Echo 07/22/17 Left ventricle: The cavity size was normal. There was moderate    concentric hypertrophy. Systolic function was normal. The    estimated ejection fraction was in the range of 60% to 65%. Wall    motion was normal; there were no regional wall motion    abnormalities. Doppler parameters are consistent with abnormal    left ventricular relaxation (grade 1 diastolic dysfunction).    -------------------------------------------------------------------  Study data:  No prior study was available for comparison.  Study  status:  Routine.  Procedure:  Transthoracic echocardiography.  Image quality was good.  Study completion:  There were no  complications.          Transthoracic echocardiography.  M-mode,  complete 2D, spectral Doppler, and color Doppler.  Birthdate:  Patient birthdate: March 24, 1961.  Age:  Patient is 60 yr old.  Sex:  Gender: male.    BMI: 35.7 kg/m^2.  Blood pressure:     150/88  Patient status:  Outpatient.  Study date:  Study date: 07/22/2017.  Study time: 01:29 PM.   EKG:  EKG is ordered today.  The ekg ordered today demonstrates normal sinus rhythm 70 bpm with no acute ST/T wave changes.  Recent Labs: 09/26/2020: ALT 49; BUN 13; Creat 0.92; Hemoglobin 17.8; Platelets 252; Potassium 4.3; Sodium 139  Recent Lipid Panel    Component Value Date/Time   CHOL 134 09/26/2020 1204   CHOL 167 05/08/2016 0803   TRIG 121 09/26/2020 1204   HDL 46 09/26/2020 1204   HDL 52 05/08/2016 0803   CHOLHDL 2.9 09/26/2020 1204   LDLCALC 68 09/26/2020 1204   Home Medications   Current Meds  Medication Sig  . ACCU-CHEK GUIDE test strip TEST BLOOD SUGAR UP TO TWICE DAILY  . Accu-Chek Softclix Lancets lancets TEST TWICE DAILY  . aspirin 81 MG tablet Take 81 mg by mouth daily.  Marland Kitchen atorvastatin (LIPITOR) 80 MG tablet Take 1 tablet (80 mg total) by mouth daily.  . blood glucose meter kit and supplies 1 each by Other route in the morning and at bedtime. Dispense based on patient and insurance preference. Use up to two  times daily. (FOR ICD-10 E10.9, E11.9).  . carvedilol (COREG) 6.25 MG tablet Take 1 tablet (6.25 mg total) by mouth 2 (two) times daily.  . cetirizine (ZYRTEC) 10 MG tablet Take 10 mg by mouth daily as needed for allergies.   . dapagliflozin propanediol (FARXIGA) 5 MG TABS tablet Take 1 tablet (5 mg total) by mouth daily. New dose  . dicyclomine (BENTYL) 10  MG capsule TAKE 1 CAPSULE BY MOUTH THREE TIMES A DAY BEFORE MEALS  . ezetimibe (ZETIA) 10 MG tablet Take 1 tablet (10 mg total) by mouth daily.  . fluconazole (DIFLUCAN) 200 MG tablet Take 1 tablet (200 mg total) by mouth 3 (three) times a week.  . fluticasone (FLONASE) 50 MCG/ACT nasal spray Place 2 sprays into the nose daily as needed for allergies or rhinitis.   Marland Kitchen ketoconazole (NIZORAL) 2 % shampoo Apply 1 application topically 3 (three) times a week. Wash scalp/body  3 times a week, let sit 5 minutes before rinsing off, avoid eyes  . losartan-hydrochlorothiazide (HYZAAR) 50-12.5 MG tablet Take 1 tablet by mouth daily.  Marland Kitchen lubiprostone (AMITIZA) 24 MCG capsule TAKE 1 CAPSULE(24 MCG) BY MOUTH TWICE DAILY WITH A MEAL  . NITROSTAT 0.4 MG SL tablet place 1 tablet under the tongue every 5 minutes for UP TO 3 doses if needed for angina as directed by prescriber  . pregabalin (LYRICA) 50 MG capsule Take 1 capsule (50 mg total) by mouth 3 (three) times daily.  . ranolazine (RANEXA) 1000 MG SR tablet Take 1 tablet (1,000 mg total) by mouth 2 (two) times daily.  Marland Kitchen terbinafine (LAMISIL) 250 MG tablet Take 1 tablet (250 mg total) by mouth daily.  . TRULICITY 1.5 MO/2.9UT SOPN ADMINISTER 1.5 MG UNDER THE SKIN 1 TIME A WEEK     Review of Systems   Review of Systems  Constitutional: Negative for chills, fever and malaise/fatigue.  Cardiovascular: Positive for dyspnea on exertion, near-syncope and palpitations. Negative for chest pain, leg swelling, orthopnea and syncope.  Respiratory: Negative for cough, shortness of breath and wheezing.   Gastrointestinal: Negative for nausea and vomiting.  Neurological: Positive for light-headedness. Negative for dizziness and weakness.   All other systems reviewed and are otherwise negative except as noted above.  Physical Exam    VS:  BP 136/76 (BP Location: Left Arm, Patient Position: Sitting, Cuff Size: Large)   Pulse 78   Ht '5\' 11"'  (1.803 m)   Wt 229 lb (103.9  kg)   SpO2 96%   BMI 31.94 kg/m  , BMI Body mass index is 31.94 kg/m.  Wt Readings from Last 3 Encounters:  01/21/21 229 lb (103.9 kg)  12/19/20 224 lb 11.2 oz (101.9 kg)  09/26/20 235 lb 9.6 oz (106.9 kg)    GEN: Well nourished, overweight, well developed, in no acute distress. HEENT: normal. Neck: Supple, no JVD, carotid bruits, or masses. Cardiac: RRR, no murmurs, rubs, or gallops. No clubbing, cyanosis, edema.  Radials/DP/PT 2+ and equal bilaterally.  Respiratory:  Respirations regular and unlabored, clear to auscultation bilaterally. GI: Soft, nontender, nondistended. MS: No deformity or atrophy. Skin: Warm and dry, no rash. Neuro:  Strength and sensation are intact. Psych: Normal affect.  Assessment & Plan    1. Mild-moderate CAD with 30% stenosis to LAD by [facility] 07/2020-  EKG today NSR with no acute ST/T wave changes. No chest pain, pressure, tightness. Reports worsening dyspnea on exertion over the last 2 months. Plan for echocardiogram and lexiscan myoview to evaluated for potential ischemia vs valvular abnormality vs heart failure. The risks [chest pain, shortness of breath, cardiac arrhythmias, dizziness, blood pressure fluctuations, myocardial infarction, stroke/transient ischemic attack, nausea, vomiting, allergic reaction, radiation exposure, metallic taste sensation and life-threatening complications (estimated to be 1 in 10,000)], benefits (risk stratification, diagnosing coronary artery disease, treatment guidance) and alternatives of a nuclear stress test were discussed in detail with Mr. Alabi and he agrees to proceed. If this testing is unremarkable, consider referral to pulmonology.   2. HLD, LDL goal <70 -09/26/2020 LDL 68.  Continue atorvastatin 80 mg daily, Zetia 10 mg daily.  3. HTN - BP reasonably well controlled.  Orthostatic vital signs today negative.  Continue present antihypertensive regimen including Coreg 6.25 mg twice daily, losartan-HCTZ 50-12.5 mg  daily  4. DM2 -continue to follow with PCP.  Reports no episodes of hypoglycemia and checks his blood glucose routinely.    5. Near-syncope / Lightheadedness -previous ZIO monitor  2019 with no significant arrhythmia.  Orthostatic vitals today were negative.  Reports lightheadedness and near syncope.  Reports no hypoglycemia. Plan for echocardiogram to rule out valvular etiology. May consider ZIO at follow up but will defer to not interfere with imaging.   6. Palpitations - Reports intermittent sensation of pounding heart rate.  Continue Coreg 6.25 mg twice daily.  Previous monitor 03/2018 with no significant arrhythmia.  We did discuss obtaining CBC, BMP, TSH today but he politely requests to defer until next visit as he does not like lab work.  Consider ZIO monitor at next visit though will defer presently as would interfere with imaging for echo/lexiscan.  7. OSA -reports compliance with the CPAP.  Continued CPAP compliance encouraged.  Disposition: Follow up in 6 week(s) with Dr. Fletcher Anon or APP.   Signed, Loel Dubonnet, NP 01/21/2021, 10:51 AM [place]

## 2021-01-21 ENCOUNTER — Ambulatory Visit: Payer: Medicaid Other | Admitting: Family

## 2021-01-21 ENCOUNTER — Encounter: Payer: Self-pay | Admitting: Family

## 2021-01-21 ENCOUNTER — Other Ambulatory Visit: Payer: Self-pay

## 2021-01-21 VITALS — BP 136/76 | HR 78 | Ht 71.0 in | Wt 229.0 lb

## 2021-01-21 DIAGNOSIS — E782 Mixed hyperlipidemia: Secondary | ICD-10-CM | POA: Diagnosis not present

## 2021-01-21 DIAGNOSIS — I25118 Atherosclerotic heart disease of native coronary artery with other forms of angina pectoris: Secondary | ICD-10-CM

## 2021-01-21 DIAGNOSIS — I10 Essential (primary) hypertension: Secondary | ICD-10-CM

## 2021-01-21 DIAGNOSIS — R0600 Dyspnea, unspecified: Secondary | ICD-10-CM

## 2021-01-21 DIAGNOSIS — R002 Palpitations: Secondary | ICD-10-CM | POA: Diagnosis not present

## 2021-01-21 NOTE — Patient Instructions (Addendum)
Medication Instructions:  No medication changes today.   *If you need a refill on your cardiac medications before your next appointment, please call your pharmacy*   Lab Work: None ordered today.   Testing/Procedures: 1) Your physician has requested that you have an echocardiogram. Echocardiography is a painless test that uses sound waves to create images of your heart. It provides your doctor with information about the size and shape of your heart and how well your heart's chambers and valves are working. This procedure takes approximately one hour. There are no restrictions for this procedure.  2) Your physician has requested that you have a lexiscan myoview. Please follow instruction sheet, as given.  [place]  Your caregiver has ordered a Stress Test with nuclear imaging. The purpose of this test is to evaluate the blood supply to your heart muscle. This procedure is referred to as a "Non-Invasive Stress Test." This is because other than having an IV started in your vein, nothing is inserted or "invades" your body. Cardiac stress tests are done to find areas of poor blood flow to the heart by determining the extent of coronary artery disease (CAD). Some patients exercise on a treadmill, which naturally increases the blood flow to your heart, while others who are  unable to walk on a treadmill due to physical limitations have a pharmacologic/chemical stress agent called Lexiscan . This medicine will mimic walking on a treadmill by temporarily increasing your coronary blood flow.   Please note: these test may take anywhere between 2-4 hours to complete  PLEASE REPORT TO [place] AT THE FIRST DESK WILL DIRECT YOU WHERE TO GO  Date of Procedure:_____________________________________  Arrival Time for Procedure:______________________________  Instructions regarding medication:    __X__:  Hold betablocker(s) night before procedure and morning of  procedure - Carvedilol    PLEASE NOTIFY THE OFFICE AT LEAST 24 HOURS IN ADVANCE IF YOU ARE UNABLE TO KEEP YOUR APPOINTMENT.  [phone] AND  PLEASE NOTIFY NUCLEAR MEDICINE AT [facility] AT LEAST 24 HOURS IN ADVANCE IF YOU ARE UNABLE TO KEEP YOUR APPOINTMENT. [phone]  How to prepare for your Myoview test:  1. Do not eat or drink after midnight 2. No caffeine for 24 hours prior to test 3. No smoking 24 hours prior to test. 4. Your medication may be taken with water.  If your doctor stopped a medication because of this test, do not take that medication. 5. Please wear a short sleeve shirt. 6. No perfume, cologne or lotion. 7. Wear comfortable walking shoes.      Follow-Up: At [facility], you and your health needs are our priority.  As part of our continuing mission to provide you with exceptional heart care, we have created designated Provider Care Teams.  These Care Teams include your primary Cardiologist (physician) and Advanced Practice Providers (APPs -  Physician Assistants and Nurse Practitioners) who all work together to provide you with the care you need, when you need it.  We recommend signing up for the patient portal called "MyChart".  Sign up information is provided on this After Visit Summary.  MyChart is used to connect with patients for Virtual Visits (Telemedicine).  Patients are able to view lab/test results, encounter notes, upcoming appointments, etc.  Non-urgent messages can be sent to your provider as well.   To learn more about what you can do with MyChart, go to [email or web address].    Your next appointment:   6 week(s)  The format for  your next appointment:   In Person  Provider:   You may see [name], MD or one of the following Advanced Practice Providers on your designated Care Team:    Murray Hodgkins, NP  Christell Faith, PA-C  Marrianne Mood, PA-C  Cadence [place], [place]  Laurann Montana, NP  Other Instructions

## 2021-01-24 ENCOUNTER — Other Ambulatory Visit: Payer: Self-pay

## 2021-01-24 ENCOUNTER — Ambulatory Visit: Payer: Medicaid Other

## 2021-01-24 ENCOUNTER — Ambulatory Visit
Admission: RE | Admit: 2021-01-24 | Discharge: 2021-01-24 | Disposition: A | Discharge status: Home / Self Care | Payer: Medicaid Other | Source: Ambulatory Visit | Attending: Family | Admitting: Family

## 2021-01-24 DIAGNOSIS — R0600 Dyspnea, unspecified: Secondary | ICD-10-CM

## 2021-01-24 DIAGNOSIS — I25118 Atherosclerotic heart disease of native coronary artery with other forms of angina pectoris: Secondary | ICD-10-CM | POA: Diagnosis not present

## 2021-01-24 LAB — NM MYOCAR MULTI W/SPECT W/WALL MOTION / EF
Estimated workload: 1 METS
Exercise duration (min): 0 min
Exercise duration (sec): 0 s
LV dias vol: 120 mL (ref 62–150)
LV sys vol: 51 mL
MPHR: 162 {beats}/min
Peak HR: 85 {beats}/min
Percent HR: 52 %
Rest HR: 64 {beats}/min
SDS: 0
SRS: 5
SSS: 2
TID: 0.91

## 2021-01-24 MED ORDER — TECHNETIUM TC 99M TETROFOSMIN IV KIT
32.7100 | PACK | Freq: Once | INTRAVENOUS | Status: AC | PRN
  Administered 2021-01-24: 32.71 via INTRAVENOUS

## 2021-01-24 MED ORDER — TECHNETIUM TC 99M TETROFOSMIN IV KIT
10.0000 | PACK | Freq: Once | INTRAVENOUS | Status: AC | PRN
  Administered 2021-01-24: 10.15 via INTRAVENOUS

## 2021-01-24 MED ORDER — REGADENOSON 0.4 MG/5ML IV SOLN
0.4000 mg | Freq: Once | INTRAVENOUS | Status: AC
  Administered 2021-01-24: 0.4 mg via INTRAVENOUS

## 2021-01-25 ENCOUNTER — Encounter: Payer: Self-pay | Admitting: Family

## 2021-02-03 ENCOUNTER — Ambulatory Visit: Payer: Medicaid Other | Admitting: Dermatology

## 2021-02-14 ENCOUNTER — Ambulatory Visit (INDEPENDENT_AMBULATORY_CARE_PROVIDER_SITE_OTHER): Payer: Medicaid Other

## 2021-02-14 ENCOUNTER — Other Ambulatory Visit: Payer: Self-pay

## 2021-02-14 DIAGNOSIS — E119 Type 2 diabetes mellitus without complications: Secondary | ICD-10-CM | POA: Diagnosis not present

## 2021-02-14 DIAGNOSIS — R002 Palpitations: Secondary | ICD-10-CM

## 2021-02-14 DIAGNOSIS — B351 Tinea unguium: Secondary | ICD-10-CM | POA: Diagnosis not present

## 2021-02-14 DIAGNOSIS — R0600 Dyspnea, unspecified: Secondary | ICD-10-CM | POA: Diagnosis not present

## 2021-02-14 DIAGNOSIS — I25118 Atherosclerotic heart disease of native coronary artery with other forms of angina pectoris: Secondary | ICD-10-CM

## 2021-02-14 DIAGNOSIS — L03032 Cellulitis of left toe: Secondary | ICD-10-CM | POA: Diagnosis not present

## 2021-02-14 DIAGNOSIS — L600 Ingrowing nail: Secondary | ICD-10-CM | POA: Diagnosis not present

## 2021-02-14 LAB — ECHOCARDIOGRAM COMPLETE
AR max vel: 3.82 cm2
AV Area VTI: 4.09 cm2
AV Area mean vel: 3.58 cm2
AV Mean grad: 4 mmHg
AV Peak grad: 6.6 mmHg
Ao pk vel: 1.28 m/s
Area-P 1/2: 3.37 cm2
Calc EF: 62.7 %
S' Lateral: 2.8 cm
Single Plane A2C EF: 60.4 %
Single Plane A4C EF: 65.9 %

## 2021-02-15 ENCOUNTER — Encounter: Payer: Self-pay | Admitting: Family

## 2021-02-15 DIAGNOSIS — R0609 Other forms of dyspnea: Secondary | ICD-10-CM

## 2021-02-15 DIAGNOSIS — R0600 Dyspnea, unspecified: Secondary | ICD-10-CM

## 2021-02-17 NOTE — Telephone Encounter (Signed)
[name] [name] had [name] 01/24/21 which was low risk. Echocardiogram 02/14/21 with normal LVEF, gr1DD and mild LVH consistent with prior history of hypertension, and no significant valvular abnormalities. Previous cardiac catheterization 08/05/20 with mild nonobstructive coronary artery disease (mLAD 30% stenosed).   No clear cardiac etiology for dyspnea. Recommend follow up with PCP. I am also happy to place referral to pulmonology if he would like.   Loel Dubonnet, NP

## 2021-02-19 ENCOUNTER — Telehealth: Payer: Self-pay | Admitting: *Deleted

## 2021-02-19 DIAGNOSIS — R0600 Dyspnea, unspecified: Secondary | ICD-10-CM

## 2021-02-19 DIAGNOSIS — R0609 Other forms of dyspnea: Secondary | ICD-10-CM

## 2021-02-19 NOTE — Telephone Encounter (Signed)
-----   Message from Loel Dubonnet, NP sent at 02/17/2021 11:52 AM EST ----- Echocardiogram shows normal heart pumping function. Mild thickening of heart muscle and mild stiffness of heart likely due to history of high blood pressure - recommend we continue optimal blood pressure control. No significant valvular abnormalities. Good result! No sign of the cause of his dyspnea - recommend follow up with PCP or we can refer to pulmonology if he is interested.

## 2021-02-19 NOTE — Telephone Encounter (Signed)
Reviewed results and recommendations with patient and he verbalized understanding. Referral placed for pulmonary and updated him that someone should call to get that scheduled. He verbalized understanding of our conversation with no further questions at this time.

## 2021-02-28 ENCOUNTER — Other Ambulatory Visit: Payer: Self-pay

## 2021-02-28 ENCOUNTER — Encounter: Payer: Self-pay | Admitting: Family Medicine

## 2021-02-28 ENCOUNTER — Ambulatory Visit
Admission: RE | Admit: 2021-02-28 | Discharge: 2021-02-28 | Disposition: A | Discharge status: Home / Self Care | Payer: Medicaid Other | Source: Ambulatory Visit | Attending: Family Medicine | Admitting: Family Medicine

## 2021-02-28 ENCOUNTER — Other Ambulatory Visit
Admission: RE | Admit: 2021-02-28 | Discharge: 2021-02-28 | Disposition: A | Discharge status: Home / Self Care | Payer: Medicaid Other | Source: Ambulatory Visit | Attending: Family Medicine | Admitting: Family Medicine

## 2021-02-28 ENCOUNTER — Ambulatory Visit: Payer: Medicaid Other | Admitting: Family Medicine

## 2021-02-28 VITALS — BP 142/84 | HR 74 | Temp 97.8°F | Resp 22 | Ht 71.0 in | Wt 228.8 lb

## 2021-02-28 DIAGNOSIS — R0602 Shortness of breath: Secondary | ICD-10-CM | POA: Diagnosis not present

## 2021-02-28 DIAGNOSIS — R5383 Other fatigue: Secondary | ICD-10-CM

## 2021-02-28 DIAGNOSIS — M791 Myalgia, unspecified site: Secondary | ICD-10-CM

## 2021-02-28 LAB — COMPREHENSIVE METABOLIC PANEL
ALT: 47 U/L — ABNORMAL HIGH (ref 0–44)
AST: 57 U/L — ABNORMAL HIGH (ref 15–41)
Albumin: 4.3 g/dL (ref 3.5–5.0)
Alkaline Phosphatase: 61 U/L (ref 38–126)
Anion gap: 15 (ref 5–15)
BUN: 10 mg/dL (ref 6–20)
CO2: 23 mmol/L (ref 22–32)
Calcium: 9.3 mg/dL (ref 8.9–10.3)
Chloride: 102 mmol/L (ref 98–111)
Creatinine, Ser: 0.82 mg/dL (ref 0.61–1.24)
GFR, Estimated: >60 mL/min (ref >60)
Glucose, Bld: 88 mg/dL (ref 70–99)
Potassium: 4.1 mmol/L (ref 3.5–5.1)
Sodium: 140 mmol/L (ref 135–145)
Total Bilirubin: 1 mg/dL (ref 0.3–1.2)
Total Protein: 7.9 g/dL (ref 6.5–8.1)

## 2021-02-28 LAB — BRAIN NATRIURETIC PEPTIDE: B Natriuretic Peptide: 36.3 pg/mL (ref 0.0–100.0)

## 2021-02-28 LAB — CBC WITH DIFFERENTIAL/PLATELET
Abs Immature Granulocytes: 0.02 10*3/uL (ref 0.00–0.07)
Basophils Absolute: 0.1 10*3/uL (ref 0.0–0.1)
Basophils Relative: 1 %
Eosinophils Absolute: 0.1 10*3/uL (ref 0.0–0.5)
Eosinophils Relative: 1 %
HCT: 48.8 % (ref 39.0–52.0)
Hemoglobin: 17.1 g/dL — ABNORMAL HIGH (ref 13.0–17.0)
Immature Granulocytes: 0 %
Lymphocytes Relative: 42 %
Lymphs Abs: 4 10*3/uL (ref 0.7–4.0)
MCH: 33 pg (ref 26.0–34.0)
MCHC: 35 g/dL (ref 30.0–36.0)
MCV: 94.2 fL (ref 80.0–100.0)
Monocytes Absolute: 0.8 10*3/uL (ref 0.1–1.0)
Monocytes Relative: 8 %
Neutro Abs: 4.6 10*3/uL (ref 1.7–7.7)
Neutrophils Relative %: 48 %
Platelets: 205 10*3/uL (ref 150–400)
RBC: 5.18 MIL/uL (ref 4.22–5.81)
RDW: 14.2 % (ref 11.5–15.5)
WBC: 9.6 10*3/uL (ref 4.0–10.5)
nRBC: 0 % (ref 0.0–0.2)

## 2021-02-28 MED ORDER — IPRATROPIUM-ALBUTEROL 20-100 MCG/ACT IN AERS
1.0000 | INHALATION_SPRAY | Freq: Four times a day (QID) | RESPIRATORY_TRACT | 0 refills | Status: DC
Start: 2021-02-28 — End: 2021-07-30

## 2021-02-28 NOTE — Progress Notes (Signed)
Name: [name] [name]   MRN: [id number]    DOB: November 10, 1961   Date:02/28/2021       Progress Note  Subjective  Chief Complaint  Can't catch breath  HPI  SOB: she states his girlfriend got sick last week, tested negative for COVID-19 and flu negative however had to go to [facility] and is hospitalized with pneumonia.  He states he developed sore throat, dry cough and significant SOB over the past few days. He feels achy all over, no fever or chills. He is very worried. He has been having some SOB for months, seen by Cardiologist and had a recent stress test , he states it is just worse over the past few days.He already has a referral placed for pulmonologist for the end of the month. Explained we will get labs and CXR to rule out COVID-19 and also pneumonia, we will contact him later today once results are back   Patient Active Problem List   Diagnosis Date Noted  . Localized, primary osteoarthritis of hand 09/28/2019  . Seizure ([place]) 02/22/2018  . Mild cognitive impairment 11/17/2017  . Radiculopathy of cervical region 11/17/2017  . Stenosis of carotid artery 09/08/2017  . Cervical stenosis of spine 09/08/2017  . B12 deficiency 06/28/2017  . Headache disorder 02/15/2017  . Memory loss or impairment 02/15/2017  . Coronary artery disease involving native coronary artery of native heart   . Radiculitis of left cervical region 03/05/2016  . Diabetic neuropathy associated with type 2 diabetes mellitus ([place]) 12/05/2015  . Patellar subluxation 06/05/2015  . Allergic rhinitis, seasonal 06/05/2015  . Chronic constipation 06/05/2015  . Chronic lower back pain 06/05/2015  . Morbid obesity due to excess calories ([place]) 06/05/2015  . Vitamin D deficiency 06/05/2015  . Central sleep apnea 06/05/2015  . 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 Nerve root pain 06/05/2015  . Acquired polycythemia 06/05/2015  . Anterior knee pain 06/05/2015  . Dysmetabolic syndrome 87/86/7672  . Failure of erection 06/05/2015  .  Gastro-esophageal reflux disease without esophagitis 06/05/2015  . Neuropathy 06/05/2015  . CAD (coronary artery disease)   . Angina, class III ([place]) 05/23/2015  . Essential hypertension 05/23/2015  . Hyperlipidemia 05/23/2015  . Inguinal hernia without mention of obstruction or gangrene, recurrent unilateral or unspecified 03/24/2013    Past Surgical History:  Procedure Laterality Date  . BACK SURGERY  12/2008   X2-LUMBAR  . CARDIAC CATHETERIZATION N/A 05/30/2015   Procedure: Left Heart Cath;  Surgeon: Wellington Hampshire, MD;  Location: [place] [facility];  Service: Cardiovascular;  Laterality: N/A;  . CARDIAC CATHETERIZATION N/A 04/16/2016   Procedure: Left Heart Cath and Coronary Angiography;  Surgeon: Wellington Hampshire, MD;  Location: [place] [facility];  Service: Cardiovascular;  Laterality: N/A;  . COLONOSCOPY  2014   Dr. [name]  . COLONOSCOPY WITH PROPOFOL N/A 12/25/2016   Procedure: COLONOSCOPY WITH PROPOFOL;  Surgeon: Jonathon Bellows, MD;  Location: [facility] ENDOSCOPY;  Service: Endoscopy;  Laterality: N/A;  . COLONOSCOPY WITH PROPOFOL N/A 01/29/2017   Procedure: COLONOSCOPY WITH PROPOFOL;  Surgeon: Jonathon Bellows, MD;  Location: [facility] ENDOSCOPY;  Service: Endoscopy;  Laterality: N/A;  . COLONOSCOPY WITH PROPOFOL N/A 04/11/2020   Procedure: COLONOSCOPY WITH PROPOFOL;  Surgeon: Jonathon Bellows, MD;  Location: [facility]OSCOPY;  Service: Gastroenterology;  Laterality: N/A;  . ESOPHAGOGASTRODUODENOSCOPY (EGD) WITH PROPOFOL N/A 01/26/2019   Procedure: ESOPHAGOGASTRODUODENOSCOPY (EGD) WITH PROPOFOL;  Surgeon: Lin Landsman, MD;  Location: [facility] ENDOSCOPY;  Service: Gastroenterology;  Laterality: N/A;  . EVALUATION UNDER ANESTHESIA  WITH HEMORRHOIDECTOMY N/A 02/24/2017   Procedure: EXAM UNDER ANESTHESIA WITH POSSIBLE EXCISION OF INTERNAL HEMORRHOIDS;  Surgeon: Olean Ree, MD;  Location: [facility] ORS;  Service: General;  Laterality: N/A;  . FISSURECTOMY  02/24/2017   Procedure: FISSURECTOMY;  Surgeon: Olean Ree, MD;  Location: [facility] ORS;  Service: General;;  . HAND SURGERY Left 2020  . HERNIA REPAIR Right 1991  . INGUINAL HERNIA REPAIR Right 2012   Dr [name]  . INGUINAL HERNIA REPAIR Right 2014   Dr [name]  . LEFT HEART CATH AND CORONARY ANGIOGRAPHY Left 08/05/2020   Procedure: LEFT HEART CATH AND CORONARY ANGIOGRAPHY poss PCI;  Surgeon: Wellington Hampshire, MD;  Location: [place] [facility];  Service: Cardiovascular;  Laterality: Left;    Family History  Problem Relation Age of Onset  . Heart Problems Mother   . Heart Problems Father 99       myocardial infarction  . Mental illness Brother   . Testicular cancer Paternal Uncle   . Other Maternal Aunt        COVID  . Breast cancer Cousin     Social History   Tobacco Use  . Smoking status: Former Smoker    Years: 2.00    Types: Cigars    Start date: 12/28/2002    Quit date: 06/04/2005    Years since quitting: 15.7  . Smokeless tobacco: Never Used  Substance Use Topics  . Alcohol use: Yes    Alcohol/week: 1.0 standard drink    Types: 1 Glasses of wine per week    Comment: RARE BEER     Current Outpatient Medications:  .  ACCU-CHEK GUIDE test strip, TEST BLOOD SUGAR UP TO TWICE DAILY, Disp: 100 strip, Rfl: 1 .  Accu-Chek Softclix Lancets lancets, TEST TWICE DAILY, Disp: 100 each, Rfl: 3 .  aspirin 81 MG tablet, Take 81 mg by mouth daily., Disp: , Rfl:  .  atorvastatin (LIPITOR) 80 MG tablet, Take 1 tablet (80 mg total) by mouth daily., Disp: 90 tablet, Rfl: 1 .  blood glucose meter kit and supplies, 1 each by Other route in the morning and at bedtime. Dispense based on patient and insurance preference. Use up to two  times daily. (FOR ICD-10 E10.9, E11.9)., Disp: 1 each, Rfl: 0 .  carvedilol (COREG) 6.25 MG tablet, Take 1 tablet (6.25 mg total) by mouth 2 (two) times daily., Disp: 180 tablet, Rfl: 1 .  cetirizine (ZYRTEC) 10 MG tablet, Take 10 mg by mouth daily as needed for allergies. , Disp: , Rfl: 1 .  dapagliflozin  propanediol (FARXIGA) 5 MG TABS tablet, Take 1 tablet (5 mg total) by mouth daily. New dose, Disp: 90 tablet, Rfl: 0 .  dicyclomine (BENTYL) 10 MG capsule, TAKE 1 CAPSULE BY MOUTH THREE TIMES A DAY BEFORE MEALS, Disp: 90 capsule, Rfl: 0 .  ezetimibe (ZETIA) 10 MG tablet, Take 1 tablet (10 mg total) by mouth daily., Disp: 90 tablet, Rfl: 1 .  fluticasone (FLONASE) 50 MCG/ACT nasal spray, Place 2 sprays into the nose daily as needed for allergies or rhinitis. , Disp: , Rfl:  .  isosorbide mononitrate (IMDUR) 30 MG 24 hr tablet, Take 15 mg by mouth daily., Disp: , Rfl:  .  ketoconazole (NIZORAL) 2 % shampoo, Apply 1 application topically 3 (three) times a week. Wash scalp/body 3 times a week, let sit 5 minutes before rinsing off, avoid eyes, Disp: 120 mL, Rfl: 3 .  lubiprostone (AMITIZA) 24 MCG capsule, TAKE 1 CAPSULE(24 MCG) BY  MOUTH TWICE DAILY WITH A MEAL, Disp: 60 capsule, Rfl: 0 .  NITROSTAT 0.4 MG SL tablet, place 1 tablet under the tongue every 5 minutes for UP TO 3 doses if needed for angina as directed by prescriber, Disp: 25 tablet, Rfl: 1 .  pregabalin (LYRICA) 50 MG capsule, Take 1 capsule (50 mg total) by mouth 3 (three) times daily., Disp: 90 capsule, Rfl: 0 .  ranolazine (RANEXA) 1000 MG SR tablet, Take 1 tablet (1,000 mg total) by mouth 2 (two) times daily., Disp: 180 tablet, Rfl: 3 .  terbinafine (LAMISIL) 250 MG tablet, Take 1 tablet (250 mg total) by mouth daily., Disp: 30 tablet, Rfl: 0 .  TRULICITY 1.5 WE/3.1VQ SOPN, ADMINISTER 1.5 MG UNDER THE SKIN 1 TIME A WEEK, Disp: 2 mL, Rfl: 2 .  valsartan (DIOVAN) 160 MG tablet, valsartan 160 mg tablet, Disp: , Rfl:   Allergies  Allergen Reactions  . Gabapentin Other (See Comments)    Groggy-Mood Changes  . Latex Rash  . Penicillins Hives and Rash    Has patient had a PCN reaction causing immediate rash, facial/tongue/throat swelling, SOB or lightheadedness with hypotension: Yes Has patient had a PCN reaction causing severe rash  involving mucus membranes or skin necrosis: No Has patient had a PCN reaction that required hospitalization No Has patient had a PCN reaction occurring within the last 10 years: No If all of the above answers are "NO", then may proceed with Cephalosporin use.     I personally reviewed active problem list, medication list, allergies, family history, social history, health maintenance with the patient/caregiver today.   ROS  Ten systems reviewed and is negative except as mentioned in HPI   Objective  Vitals:   02/28/21 1120  BP: (!) 142/84  Pulse: 74  Resp: (!) 22  Temp: 97.8 F (36.6 C)  TempSrc: Oral  SpO2: 96%  Weight: 228 lb 12.8 oz (103.8 kg)  Height: 5' 11" (1.803 m)    Body mass index is 31.91 kg/m.  Physical Exam  Constitutional: Patient appears well-developed and well-nourished. Obese  Mild SOB  HEENT: head atraumatic, normocephalic, pupils equal and reactive to light,  neck supple Cardiovascular: Normal rate, regular rhythm and normal heart sounds.  No murmur heard. No BLE edema. Pulmonary/Chest: Effort normal and breath sounds normal. No respiratory distress. Abdominal: Soft.  There is no tenderness. Psychiatric: Patient has a normal mood and affect. behavior is normal. Judgment and thought content normal.  Recent Results (from the past 2160 hour(s))  POCT HgB A1C     Status: Normal   Collection Time: 12/19/20  8:59 AM  Result Value Ref Range   Hemoglobin A1C 5.5 4.0 - 5.6 %   HbA1c POC (<> result, manual entry)     HbA1c, POC (prediabetic range)     HbA1c, POC (controlled diabetic range)    Novel Coronavirus, NAA ([facility])     Status: None   Collection Time: 12/30/20 12:00 AM   Specimen: Nasopharyngeal(NP) swabs in vial transport medium   Nasopharynge  Is this  Result Value Ref Range   SARS-CoV-2, NAA Not Detected Not Detected    Comment: This nucleic acid amplification test was developed and its performance characteristics determined by [employer]. Nucleic acid amplification tests include RT-PCR and TMA. This test has not been FDA cleared or approved. This test has been authorized by FDA under an Emergency Use Authorization (EUA). This test is only authorized for the duration of time the declaration that circumstances exist justifying  the authorization of the emergency use of in vitro diagnostic tests for detection of SARS-CoV-2 virus and/or diagnosis of COVID-19 infection under section 564(b)(1) of the Act, 21 U.S.C. 829FAO-1(H) (1), unless the authorization is terminated or revoked sooner. When diagnostic testing is negative, the possibility of a false negative result should be considered in the context of a patient's recent exposures and the presence of clinical signs and symptoms consistent with COVID-19. An individual without symptoms of COVID-19 and who is not shedding SARS-CoV-2 virus wo uld expect to have a negative (not detected) result in this assay.   SARS-COV-2, NAA 2 DAY TAT     Status: None   Collection Time: 12/30/20 12:00 AM   Nasopharynge  Is this  Result Value Ref Range   SARS-CoV-2, NAA 2 DAY TAT Performed   Specimen status report     Status: None   Collection Time: 12/30/20 12:00 AM  Result Value Ref Range   specimen status report Comment     Comment: Please note Please note The date and/or time of collection was not indicated on the requisition as required by state and federal law.  The date of receipt of the specimen was used as the collection date if not supplied.   Novel Coronavirus, NAA ([facility])     Status: None   Collection Time: 01/07/21 12:00 AM   Specimen: Nasopharyngeal(NP) swabs in vial transport medium   Nasopharynge  Result Value Ref Range   SARS-CoV-2, NAA Not Detected Not Detected    Comment: This nucleic acid amplification test was developed and its performance characteristics determined by [employer]. Nucleic acid amplification tests include RT-PCR and TMA.  This test has not been FDA cleared or approved. This test has been authorized by FDA under an Emergency Use Authorization (EUA). This test is only authorized for the duration of time the declaration that circumstances exist justifying the authorization of the emergency use of in vitro diagnostic tests for detection of SARS-CoV-2 virus and/or diagnosis of COVID-19 infection under section 564(b)(1) of the Act, 21 U.S.C. 086VHQ-4(O) (1), unless the authorization is terminated or revoked sooner. When diagnostic testing is negative, the possibility of a false negative result should be considered in the context of a patient's recent exposures and the presence of clinical signs and symptoms consistent with COVID-19. An individual without symptoms of COVID-19 and who is not shedding SARS-CoV-2 virus wo uld expect to have a negative (not detected) result in this assay.   SARS-COV-2, NAA 2 DAY TAT     Status: None   Collection Time: 01/07/21 12:00 AM   Nasopharynge  Result Value Ref Range   SARS-CoV-2, NAA 2 DAY TAT Performed   Specimen status report     Status: None   Collection Time: 01/07/21 12:00 AM  Result Value Ref Range   specimen status report Comment     Comment: Please note Please note The date and/or time of collection was not indicated on the requisition as required by state and federal law.  The date of receipt of the specimen was used as the collection date if not supplied.   NM Myocar Multi W/Spect W/Wall Motion / EF     Status: None   Collection Time: 01/24/21  9:57 AM  Result Value Ref Range   Rest HR 64 bpm   Rest BP 141/78 mmHg   Exercise duration (sec) 0 sec   Percent HR 52 %   Exercise duration (min) 0 min   Estimated workload 1.0 METS  Peak HR 85 bpm   Peak BP 119/67 mmHg   MPHR 162 bpm   SSS 2    SRS 5    SDS 0    TID 0.91    LV sys vol 51 mL   LV dias vol 120 62 - 150 mL  ECHOCARDIOGRAM COMPLETE     Status: None   Collection Time: 02/14/21  9:49 AM   Result Value Ref Range   AR max vel 3.82 cm2   AV Peak grad 6.6 mmHg   Ao pk vel 1.28 m/s   S' Lateral 2.80 cm   Area-P 1/2 3.37 cm2   AV Area VTI 4.09 cm2   AV Mean grad 4.0 mmHg   Single Plane A4C EF 65.9 %   Single Plane A2C EF 60.4 %   Calc EF 62.7 %   AV Area mean vel 3.58 cm2      PHQ2/9: Depression screen [facility] 2/9 02/28/2021 12/19/2020 09/26/2020 08/14/2020 06/07/2020  Decreased Interest 0 0 0 0 0  Down, Depressed, Hopeless 0 0 0 0 0  PHQ - 2 Score 0 0 0 0 0  Altered sleeping - 0 - 0 0  Tired, decreased energy - 3 - 0 0  Change in appetite - 0 - 0 0  Feeling bad or failure about yourself  - 0 - 0 0  Trouble concentrating - 0 - 0 0  Moving slowly or fidgety/restless - 0 - 0 0  Suicidal thoughts - 0 - 0 0  PHQ-9 Score - 3 - 0 0  Difficult doing work/chores - - - - -  Some recent data might be hidden    phq 9 is negative   Fall Risk: Fall Risk  02/28/2021 12/19/2020 09/26/2020 08/14/2020 06/07/2020  Falls in the past year? 0 0 1 0 1  Number falls in past yr: 0 0 0 0 1  Injury with Fall? - 0 0 0 1  Comment - - - - -  Risk for fall due to : - - History of fall(s) - -  Follow up Falls prevention discussed - - - -     Functional Status Survey: Is the patient deaf or have difficulty hearing?: No Does the patient have difficulty seeing, even when wearing glasses/contacts?: No Does the patient have difficulty concentrating, remembering, or making decisions?: No Does the patient have difficulty walking or climbing stairs?: No Does the patient have difficulty dressing or bathing?: No Does the patient have difficulty doing errands alone such as visiting a doctor's office or shopping?: No   Assessment & Plan   1. SOB (shortness of breath)  - DG Chest 2 View; Future - Novel Coronavirus, NAA ([facility]) - B Nat Peptide; Future - Comprehensive metabolic panel; Future - CBC with Differential/Platelet; Future - Ipratropium-Albuterol (COMBIVENT) 20-100 MCG/ACT AERS respimat;  Inhale 1 puff into the lungs every 6 (six) hours.  Dispense: 4 g; Refill: 0  2. Other fatigue  - DG Chest 2 View; Future - Novel Coronavirus, NAA ([facility]) - Comprehensive metabolic panel; Future - CBC with Differential/Platelet; Future  3. Muscle ache

## 2021-02-28 NOTE — Patient Instructions (Signed)
Shortness of Breath, Adult Shortness of breath means you have trouble breathing. Shortness of breath could be a sign of a medical problem. Follow these instructions at home:  Watch for any changes in your symptoms.  Do not use any products that contain nicotine or tobacco, such as cigarettes, e-cigarettes, and chewing tobacco.  Do not smoke. Smoking can cause shortness of breath. If you need help to quit smoking, ask your doctor.  Avoid things that can make it harder to breathe, such as: ? Mold. ? Dust. ? Air pollution. ? Chemical smells. ? Things that can cause allergy symptoms (allergens), if you have allergies.  Keep your living space clean. Use products that help remove mold and dust.  Rest as needed. Slowly return to your normal activities.  Take over-the-counter and prescription medicines only as told by your doctor. This includes oxygen therapy and inhaled medicines.  Keep all follow-up visits as told by your doctor. This is important.   Contact a doctor if:  Your condition does not get better as soon as expected.  You have a hard time doing your normal activities, even after you rest.  You have new symptoms. Get help right away if:  Your shortness of breath gets worse.  You have trouble breathing when you are resting.  You feel light-headed or you pass out (faint).  You have a cough that is not helped by medicines.  You cough up blood.  You have pain with breathing.  You have pain in your chest, arms, shoulders, or belly (abdomen).  You have a fever.  You cannot walk up stairs.  You cannot exercise the way you normally do. These symptoms may represent a serious problem that is an emergency. Do not wait to see if the symptoms will go away. Get medical help right away. Call your local emergency services (911 in the [place] Do not drive yourself to the hospital. Summary  Shortness of breath is when you have trouble breathing enough air. It can be a sign of a  medical problem.  Avoid things that make it hard for you to breathe, such as smoking, pollution, mold, and dust.  Watch for any changes in your symptoms. Contact your doctor if you do not get better or you get worse. This information is not intended to replace advice given to you by your health care provider. Make sure you discuss any questions you have with your health care provider. Document Revised: 05/16/2018 Document Reviewed: 05/16/2018 Elsevier Patient Education  2021 Elsevier Inc.  

## 2021-03-02 LAB — NOVEL CORONAVIRUS, NAA: SARS-CoV-2, NAA: NOT DETECTED

## 2021-03-02 LAB — SARS-COV-2, NAA 2 DAY TAT

## 2021-03-04 ENCOUNTER — Telehealth: Payer: Self-pay

## 2021-03-04 ENCOUNTER — Ambulatory Visit: Payer: Medicaid Other | Admitting: Family

## 2021-03-04 NOTE — Telephone Encounter (Signed)
Ordered

## 2021-03-04 NOTE — Telephone Encounter (Signed)
Copied from [place] [id number]. Topic: General - Other >> Mar 04, 2021 10:17 AM Tessa Lerner A wrote: Reason for CRM: Patient has made contact requesting for PCP to order a new mask for their CPAP machine Patient was told that it had to be ordered by their Doctor Please contact to advise

## 2021-03-05 ENCOUNTER — Telehealth: Payer: Self-pay

## 2021-03-05 ENCOUNTER — Encounter: Payer: Self-pay | Admitting: Family Medicine

## 2021-03-05 DIAGNOSIS — G4731 Primary central sleep apnea: Secondary | ICD-10-CM

## 2021-03-05 NOTE — Telephone Encounter (Signed)
Spoke with Patient and he stated he uses [facility] instead of [employer] [facility] for CPAP supplies. Rx faxed over.

## 2021-03-10 ENCOUNTER — Other Ambulatory Visit: Payer: Self-pay | Admitting: Dermatology

## 2021-03-10 ENCOUNTER — Other Ambulatory Visit: Payer: Self-pay | Admitting: Family Medicine

## 2021-03-10 DIAGNOSIS — B360 Pityriasis versicolor: Secondary | ICD-10-CM

## 2021-03-10 DIAGNOSIS — R809 Proteinuria, unspecified: Secondary | ICD-10-CM

## 2021-03-11 ENCOUNTER — Encounter: Payer: Self-pay | Admitting: Dermatology

## 2021-03-11 ENCOUNTER — Encounter: Payer: Self-pay | Admitting: Family Medicine

## 2021-03-11 DIAGNOSIS — B360 Pityriasis versicolor: Secondary | ICD-10-CM

## 2021-03-11 MED ORDER — KETOCONAZOLE 2 % EX SHAM: 1.0000 "application " | MEDICATED_SHAMPOO | CUTANEOUS | 3 refills | Status: DC

## 2021-03-12 DIAGNOSIS — E119 Type 2 diabetes mellitus without complications: Secondary | ICD-10-CM | POA: Diagnosis not present

## 2021-03-12 DIAGNOSIS — B351 Tinea unguium: Secondary | ICD-10-CM | POA: Diagnosis not present

## 2021-03-12 DIAGNOSIS — L600 Ingrowing nail: Secondary | ICD-10-CM | POA: Diagnosis not present

## 2021-03-12 DIAGNOSIS — L03031 Cellulitis of right toe: Secondary | ICD-10-CM | POA: Diagnosis not present

## 2021-03-17 ENCOUNTER — Other Ambulatory Visit: Payer: Self-pay | Admitting: Family Medicine

## 2021-03-17 DIAGNOSIS — E782 Mixed hyperlipidemia: Secondary | ICD-10-CM

## 2021-03-19 ENCOUNTER — Encounter: Payer: Self-pay | Admitting: Family Medicine

## 2021-03-20 DIAGNOSIS — G4733 Obstructive sleep apnea (adult) (pediatric): Secondary | ICD-10-CM | POA: Diagnosis not present

## 2021-03-23 ENCOUNTER — Other Ambulatory Visit: Payer: Self-pay | Admitting: Family Medicine

## 2021-03-23 DIAGNOSIS — M5431 Sciatica, right side: Secondary | ICD-10-CM

## 2021-03-23 NOTE — Telephone Encounter (Signed)
Requested medication (s) are due for refill today   Yes  Requested medication (s) are on the active medication list Yes  Future visit scheduled Yes on 03/26/21.  Note to clinic-medication not delegated.   Requested Prescriptions  Pending Prescriptions Disp Refills   pregabalin (LYRICA) 50 MG capsule [Pharmacy Med Name: PREGABALIN 50MG  CAPSULES] 90 capsule     Sig: TAKE 1 CAPSULE(50 MG) BY MOUTH THREE TIMES DAILY      Not Delegated - Neurology:  Anticonvulsants - Controlled Failed - 03/23/2021 11:41 AM      Failed - This refill cannot be delegated      Passed - Valid encounter within last 12 months    Recent Outpatient Visits           3 weeks ago SOB (shortness of breath)   [facility] Steele Sizer, MD   3 months ago Controlled type 2 diabetes mellitus with microalbuminuria, without long-term current use of insulin [facility])   [facility] [place], Drue Stager, MD   5 months ago Controlled type 2 diabetes mellitus with microalbuminuria, without long-term current use of insulin [facility])   [facility] [place], Drue Stager, MD   7 months ago Obesity (BMI 30.0-34.9)   [facility] [place], Drue Stager, MD   9 months ago Controlled type 2 diabetes mellitus with microalbuminuria, without long-term current use of insulin [facility])   [facility] Steele Sizer, MD       Future Appointments             In 3 days Steele Sizer, MD [facility] [place]   In 1 month Ralene Bathe, MD [place]

## 2021-03-25 NOTE — Progress Notes (Signed)
Name: [name] [name]   MRN: [id number]    DOB: Nov 03, 1961   Date:03/26/2021       Progress Note  Subjective  Chief Complaint  Follow up   HPI  DMII:A1C has been well controlled now 5.4 %  He is off Lantus, Trajenta and Metformin , currently on Trulicity and Farxiga, and A1C is 5.4 % we will stop [place] today.  FSBS 104-110 fasting    He has changed his diet, avoiding Gatorade, only drinking water.. No polyphagia, polydipsia or polyuria. He has associated obesity, dyslipidemia and HTN   Constipation:doing well on Amitiza, down to once daily pill  , Bristol scale of 4 , no longer straining, has increased fiber in his diet.  He is under the care of Dr. Marius Ditch   HTN/Angina : taking medication,bp has been under control at home highest is 130's/ high 80's , today is borderline at 140/86 but he did not take his medications this morning  No side effects of medication . Angina is controlled at this time, he stopped imdur because it was causing hypotension. He only took NTG once since his last visit with me  SOB:  He states he developed sore throat, dry cough and significant SOB before his last visit with me early March. He was feeling achy all over, no fever or chills and he was very concerned because his SOB got worse.  He was having some SOB for a few  Months prior to that, he was seen by Cardiologist and had a stress test. We checked BNP that was negative, normal CXR and he was given an inhaler that helped symptoms a little, but still needs to take breaks when walking, he has orthopnea. He has an appointment with pulmonologist tomorrow. He smoked but quit 10 years ago.   Hyperlipidemia: compliant with medication,reviewed last labs , LDL at goal it was 68, at goal , continue current regiment   Seizure: no recent episodes,he has seen Dr. Manuella Ghazi, he has noticed some dizziness a few times a week. He has a history of concussion, he was seen Dr. Manuella Ghazi but he states last time saw Dr. Melrose Nakayama   OSA:  he is wearing CPAPat least 6 times a week, he wears all night , he got a new machine and seemed to help decrease the need to clear his throat in am.   Rash: recurrent, spots are on his back, seen by Dr. Nehemiah Massed , finished diflucan and is now using topical medication, states symptoms are improving.   Sciatica: used to see Dr. Phyllis Ginger for neck problems, now having right lower back pain, that radiates to posterior thigh. He does not want to have injections because glucose went up . No bowel or bladder incontinence.He would like to try lyrica   Obesity: BMI is below 35 now Hehas increased physical activity, three times a week walking at the mall.Taking Trulicity and it curbs his appetite He states not walking as long or as fast due to SOB  Multilevel disc degeneration: seen by Dr. Sharyne Richters in the past, he has constant pain and occasionally shooting pain down left arm  Vertigo: he states he has noticed spinning sensation when he moves head side ways, he also states if he stand up too long and turns his head his arms shakes, he will go back to see Dr. Melrose Nakayama, not interested in seeing ENT at this time    Patient Active Problem List   Diagnosis Date Noted  . Localized, primary osteoarthritis of hand 09/28/2019  .  Seizure ([place]) 02/22/2018  . Mild cognitive impairment 11/17/2017  . Radiculopathy of cervical region 11/17/2017  . Stenosis of carotid artery 09/08/2017  . Cervical stenosis of spine 09/08/2017  . B12 deficiency 06/28/2017  . Headache disorder 02/15/2017  . Memory loss or impairment 02/15/2017  . Coronary artery disease involving native coronary artery of native heart   . Radiculitis of left cervical region 03/05/2016  . Diabetic neuropathy associated with type 2 diabetes mellitus ([place]) 12/05/2015  . Patellar subluxation 06/05/2015  . Allergic rhinitis, seasonal 06/05/2015  . Chronic constipation 06/05/2015  . Chronic lower back pain 06/05/2015  . Morbid obesity due to excess  calories ([place]) 06/05/2015  . Vitamin D deficiency 06/05/2015  . Central sleep apnea 06/05/2015  . 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 Nerve root pain 06/05/2015  . Acquired polycythemia 06/05/2015  . Anterior knee pain 06/05/2015  . Dysmetabolic syndrome 73/71/0626  . Failure of erection 06/05/2015  . Gastro-esophageal reflux disease without esophagitis 06/05/2015  . Neuropathy 06/05/2015  . CAD (coronary artery disease)   . Angina, class III ([place]) 05/23/2015  . Essential hypertension 05/23/2015  . Hyperlipidemia 05/23/2015  . Inguinal hernia without mention of obstruction or gangrene, recurrent unilateral or unspecified 03/24/2013    Past Surgical History:  Procedure Laterality Date  . BACK SURGERY  12/2008   X2-LUMBAR  . CARDIAC CATHETERIZATION N/A 05/30/2015   Procedure: Left Heart Cath;  Surgeon: Wellington Hampshire, MD;  Location: [place] [facility];  Service: Cardiovascular;  Laterality: N/A;  . CARDIAC CATHETERIZATION N/A 04/16/2016   Procedure: Left Heart Cath and Coronary Angiography;  Surgeon: Wellington Hampshire, MD;  Location: [place] [facility];  Service: Cardiovascular;  Laterality: N/A;  . COLONOSCOPY  2014   Dr. [name]  . COLONOSCOPY WITH PROPOFOL N/A 12/25/2016   Procedure: COLONOSCOPY WITH PROPOFOL;  Surgeon: Jonathon Bellows, MD;  Location: [facility] ENDOSCOPY;  Service: Endoscopy;  Laterality: N/A;  . COLONOSCOPY WITH PROPOFOL N/A 01/29/2017   Procedure: COLONOSCOPY WITH PROPOFOL;  Surgeon: Jonathon Bellows, MD;  Location: [facility] ENDOSCOPY;  Service: Endoscopy;  Laterality: N/A;  . COLONOSCOPY WITH PROPOFOL N/A 04/11/2020   Procedure: COLONOSCOPY WITH PROPOFOL;  Surgeon: Jonathon Bellows, MD;  Location[facility] ENDOSCOPY;  Service: Gastroenterology;  Laterality: N/A;  . ESOPHAGOGASTRODUODENOSCOPY (EGD) WITH PROPOFOL N/A 01/26/2019   Procedure: ESOPHAGOGASTRODUODENOSCOPY (EGD) WITH PROPOFOL;  Surgeon: Lin Landsman, MD;  Location: [facility] ENDOSCOPY;  Service: Gastroenterology;  Laterality: N/A;  .  EVALUATION UNDER ANESTHESIA WITH HEMORRHOIDECTOMY N/A 02/24/2017   Procedure: EXAM UNDER ANESTHESIA WITH POSSIBLE EXCISION OF INTERNAL HEMORRHOIDS;  Surgeon: Olean Ree, MD;  Location: [facility] ORS;  Service: General;  Laterality: N/A;  . FISSURECTOMY  02/24/2017   Procedure: FISSURECTOMY;  Surgeon: Olean Ree, MD;  Location: [facility] ORS;  Service: General;;  . HAND SURGERY Left 2020  . HERNIA REPAIR Right 1991  . INGUINAL HERNIA REPAIR Right 2012   Dr [name]  . INGUINAL HERNIA REPAIR Right 2014   Dr [name]  . LEFT HEART CATH AND CORONARY ANGIOGRAPHY Left 08/05/2020   Procedure: LEFT HEART CATH AND CORONARY ANGIOGRAPHY poss PCI;  Surgeon: Wellington Hampshire, MD;  Location: [place] [facility];  Service: Cardiovascular;  Laterality: Left;    Family History  Problem Relation Age of Onset  . Heart Problems Mother   . Heart Problems Father 59       myocardial infarction  . Mental illness Brother   . Testicular cancer Paternal Uncle   . Other Maternal Aunt  COVID  . Breast cancer Cousin     Social History   Tobacco Use  . Smoking status: Former Smoker    Years: 2.00    Types: Cigars    Start date: 12/28/2002    Quit date: 06/04/2005    Years since quitting: 15.8  . Smokeless tobacco: Never Used  Substance Use Topics  . Alcohol use: Yes    Alcohol/week: 1.0 standard drink    Types: 1 Glasses of wine per week    Comment: RARE BEER     Current Outpatient Medications:  .  ACCU-CHEK GUIDE test strip, TEST BLOOD SUGAR UP TO TWICE DAILY, Disp: 100 strip, Rfl: 1 .  Accu-Chek Softclix Lancets lancets, TEST TWICE DAILY, Disp: 100 each, Rfl: 3 .  aspirin 81 MG tablet, Take 81 mg by mouth daily., Disp: , Rfl:  .  atorvastatin (LIPITOR) 80 MG tablet, TAKE 1 TABLET(80 MG) BY MOUTH DAILY, Disp: 90 tablet, Rfl: 1 .  blood glucose meter kit and supplies, 1 each by Other route in the morning and at bedtime. Dispense based on patient and insurance preference. Use up to two  times daily. (FOR  ICD-10 E10.9, E11.9)., Disp: 1 each, Rfl: 0 .  carvedilol (COREG) 6.25 MG tablet, Take 1 tablet (6.25 mg total) by mouth 2 (two) times daily., Disp: 180 tablet, Rfl: 1 .  cetirizine (ZYRTEC) 10 MG tablet, Take 10 mg by mouth daily as needed for allergies. , Disp: , Rfl: 1 .  dapagliflozin propanediol (FARXIGA) 5 MG TABS tablet, Take 1 tablet (5 mg total) by mouth daily. New dose, Disp: 90 tablet, Rfl: 0 .  dicyclomine (BENTYL) 10 MG capsule, TAKE 1 CAPSULE BY MOUTH THREE TIMES A DAY BEFORE MEALS, Disp: 90 capsule, Rfl: 0 .  ezetimibe (ZETIA) 10 MG tablet, Take 1 tablet (10 mg total) by mouth daily., Disp: 90 tablet, Rfl: 1 .  fluticasone (FLONASE) 50 MCG/ACT nasal spray, Place 2 sprays into the nose daily as needed for allergies or rhinitis. , Disp: , Rfl:  .  Ipratropium-Albuterol (COMBIVENT) 20-100 MCG/ACT AERS respimat, Inhale 1 puff into the lungs every 6 (six) hours., Disp: 4 g, Rfl: 0 .  isosorbide mononitrate (IMDUR) 30 MG 24 hr tablet, Take 15 mg by mouth daily., Disp: , Rfl:  .  ketoconazole (NIZORAL) 2 % shampoo, Apply 1 application topically 3 (three) times a week. Wash scalp/body 3 times a week, let sit 5 minutes before rinsing off, avoid eyes, Disp: 120 mL, Rfl: 3 .  losartan-hydrochlorothiazide (HYZAAR) 50-12.5 MG tablet, Take 1 tablet by mouth daily., Disp: , Rfl:  .  lubiprostone (AMITIZA) 24 MCG capsule, TAKE 1 CAPSULE(24 MCG) BY MOUTH TWICE DAILY WITH A MEAL, Disp: 60 capsule, Rfl: 0 .  NITROSTAT 0.4 MG SL tablet, place 1 tablet under the tongue every 5 minutes for UP TO 3 doses if needed for angina as directed by prescriber, Disp: 25 tablet, Rfl: 1 .  pregabalin (LYRICA) 50 MG capsule, TAKE 1 CAPSULE(50 MG) BY MOUTH THREE TIMES DAILY, Disp: 90 capsule, Rfl: 0 .  ranolazine (RANEXA) 1000 MG SR tablet, Take 1 tablet (1,000 mg total) by mouth 2 (two) times daily., Disp: 180 tablet, Rfl: 3 .  TRULICITY 1.5 IW/9.7LG SOPN, ADMINISTER 1.5 MG UNDER THE SKIN 1 TIME A WEEK, Disp: 2 mL, Rfl:  2 .  valsartan (DIOVAN) 160 MG tablet, valsartan 160 mg tablet, Disp: , Rfl:   Allergies  Allergen Reactions  . Gabapentin Other (See Comments)    Groggy-Mood Changes  .  Latex Rash  . Penicillins Hives and Rash    Has patient had a PCN reaction causing immediate rash, facial/tongue/throat swelling, SOB or lightheadedness with hypotension: Yes Has patient had a PCN reaction causing severe rash involving mucus membranes or skin necrosis: No Has patient had a PCN reaction that required hospitalization No Has patient had a PCN reaction occurring within the last 10 years: No If all of the above answers are "NO", then may proceed with Cephalosporin use.     I personally reviewed active problem list, medication list, allergies, family history, social history, health maintenance with the patient/caregiver today.   ROS  Constitutional: Negative for fever or weight change.  Respiratory: Negative for cough , positive for  shortness of breath.   Cardiovascular: Negative for chest pain or palpitations.  Gastrointestinal: Negative for abdominal pain, no bowel changes.  Musculoskeletal: Negative for gait problem or joint swelling.  Skin: Negative for rash.  Neurological: positive for dizziness and intermittent nuchal pain.  No other specific complaints in a complete review of systems (except as listed in HPI above).  Objective  Vitals:   03/26/21 0911  BP: 140/86  Pulse: 89  Resp: 16  Temp: 98.1 F (36.7 C)  TempSrc: Oral  SpO2: 96%  Weight: 224 lb (101.6 kg)  Height: 5' 11" (1.803 m)    Body mass index is 31.24 kg/m.  Physical Exam  Constitutional: Patient appears well-developed and well-nourished. Obese  No distress.  HEENT: head atraumatic, normocephalic, pupils equal and reactive to light,  neck supple Cardiovascular: Normal rate, regular rhythm and normal heart sounds.  No murmur heard. No BLE edema. Pulmonary/Chest: Effort normal and breath sounds normal. No respiratory  distress. Abdominal: Soft.  There is no tenderness. Psychiatric: Patient has a normal mood and affect. behavior is normal. Judgment and thought content normal.  Recent Results (from the past 2160 hour(s))  Novel Coronavirus, NAA ([facility])     Status: None   Collection Time: 12/30/20 12:00 AM   Specimen: Nasopharyngeal(NP) swabs in vial transport medium   Nasopharynge  Is this  Result Value Ref Range   SARS-CoV-2, NAA Not Detected Not Detected    Comment: This nucleic acid amplification test was developed and its performance characteristics determined by [employer]. Nucleic acid amplification tests include RT-PCR and TMA. This test has not been FDA cleared or approved. This test has been authorized by FDA under an Emergency Use Authorization (EUA). This test is only authorized for the duration of time the declaration that circumstances exist justifying the authorization of the emergency use of in vitro diagnostic tests for detection of SARS-CoV-2 virus and/or diagnosis of COVID-19 infection under section 564(b)(1) of the Act, 21 U.S.C. 476LYY-5(K) (1), unless the authorization is terminated or revoked sooner. When diagnostic testing is negative, the possibility of a false negative result should be considered in the context of a patient's recent exposures and the presence of clinical signs and symptoms consistent with COVID-19. An individual without symptoms of COVID-19 and who is not shedding SARS-CoV-2 virus wo uld expect to have a negative (not detected) result in this assay.   SARS-COV-2, NAA 2 DAY TAT     Status: None   Collection Time: 12/30/20 12:00 AM   Nasopharynge  Is this  Result Value Ref Range   SARS-CoV-2, NAA 2 DAY TAT Performed   Specimen status report     Status: None   Collection Time: 12/30/20 12:00 AM  Result Value Ref Range   specimen status report Comment  Comment: Please note Please note The date and/or time of collection was not indicated on  the requisition as required by state and federal law.  The date of receipt of the specimen was used as the collection date if not supplied.   Novel Coronavirus, NAA ([facility])     Status: None   Collection Time: 01/07/21 12:00 AM   Specimen: Nasopharyngeal(NP) swabs in vial transport medium   Nasopharynge  Result Value Ref Range   SARS-CoV-2, NAA Not Detected Not Detected    Comment: This nucleic acid amplification test was developed and its performance characteristics determined by [employer]. Nucleic acid amplification tests include RT-PCR and TMA. This test has not been FDA cleared or approved. This test has been authorized by FDA under an Emergency Use Authorization (EUA). This test is only authorized for the duration of time the declaration that circumstances exist justifying the authorization of the emergency use of in vitro diagnostic tests for detection of SARS-CoV-2 virus and/or diagnosis of COVID-19 infection under section 564(b)(1) of the Act, 21 U.S.C. 962EZM-6(Q) (1), unless the authorization is terminated or revoked sooner. When diagnostic testing is negative, the possibility of a false negative result should be considered in the context of a patient's recent exposures and the presence of clinical signs and symptoms consistent with COVID-19. An individual without symptoms of COVID-19 and who is not shedding SARS-CoV-2 virus wo uld expect to have a negative (not detected) result in this assay.   SARS-COV-2, NAA 2 DAY TAT     Status: None   Collection Time: 01/07/21 12:00 AM   Nasopharynge  Result Value Ref Range   SARS-CoV-2, NAA 2 DAY TAT Performed   Specimen status report     Status: None   Collection Time: 01/07/21 12:00 AM  Result Value Ref Range   specimen status report Comment     Comment: Please note Please note The date and/or time of collection was not indicated on the requisition as required by state and federal law.  The date of receipt of  the specimen was used as the collection date if not supplied.   NM Myocar Multi W/Spect W/Wall Motion / EF     Status: None   Collection Time: 01/24/21  9:57 AM  Result Value Ref Range   Rest HR 64 bpm   Rest BP 141/78 mmHg   Exercise duration (sec) 0 sec   Percent HR 52 %   Exercise duration (min) 0 min   Estimated workload 1.0 METS   Peak HR 85 bpm   Peak BP 119/67 mmHg   MPHR 162 bpm   SSS 2    SRS 5    SDS 0    TID 0.91    LV sys vol 51 mL   LV dias vol 120 62 - 150 mL  ECHOCARDIOGRAM COMPLETE     Status: None   Collection Time: 02/14/21  9:49 AM  Result Value Ref Range   AR max vel 3.82 cm2   AV Peak grad 6.6 mmHg   Ao pk vel 1.28 m/s   S' Lateral 2.80 cm   Area-P 1/2 3.37 cm2   AV Area VTI 4.09 cm2   AV Mean grad 4.0 mmHg   Single Plane A4C EF 65.9 %   Single Plane A2C EF 60.4 %   Calc EF 62.7 %   AV Area mean vel 3.58 cm2  Novel Coronavirus, NAA ([facility])     Status: None   Collection Time: 02/28/21 12:00 AM  Specimen: Nasopharyngeal(NP) swabs in vial transport medium   Nasopharynge  Result Value Ref Range   SARS-CoV-2, NAA Not Detected Not Detected    Comment: This nucleic acid amplification test was developed and its performance characteristics determined by [employer]. Nucleic acid amplification tests include RT-PCR and TMA. This test has not been FDA cleared or approved. This test has been authorized by FDA under an Emergency Use Authorization (EUA). This test is only authorized for the duration of time the declaration that circumstances exist justifying the authorization of the emergency use of in vitro diagnostic tests for detection of SARS-CoV-2 virus and/or diagnosis of COVID-19 infection under section 564(b)(1) of the Act, 21 U.S.C. 160VPX-1(G) (1), unless the authorization is terminated or revoked sooner. When diagnostic testing is negative, the possibility of a false negative result should be considered in the context of a  patient's recent exposures and the presence of clinical signs and symptoms consistent with COVID-19. An individual without symptoms of COVID-19 and who is not shedding SARS-CoV-2 virus wo uld expect to have a negative (not detected) result in this assay.   SARS-COV-2, NAA 2 DAY TAT     Status: None   Collection Time: 02/28/21 12:00 AM   Nasopharynge  Result Value Ref Range   SARS-CoV-2, NAA 2 DAY TAT Performed   CBC with Differential/Platelet     Status: Abnormal   Collection Time: 02/28/21 12:59 PM  Result Value Ref Range   WBC 9.6 4.0 - 10.5 K/uL   RBC 5.18 4.22 - 5.81 MIL/uL   Hemoglobin 17.1 (H) 13.0 - 17.0 g/dL   HCT 48.8 39.0 - 52.0 %   MCV 94.2 80.0 - 100.0 fL   MCH 33.0 26.0 - 34.0 pg   MCHC 35.0 30.0 - 36.0 g/dL   RDW 14.2 11.5 - 15.5 %   Platelets 205 150 - 400 K/uL   nRBC 0.0 0.0 - 0.2 %   Neutrophils Relative % 48 %   Neutro Abs 4.6 1.7 - 7.7 K/uL   Lymphocytes Relative 42 %   Lymphs Abs 4.0 0.7 - 4.0 K/uL   Monocytes Relative 8 %   Monocytes Absolute 0.8 0.1 - 1.0 K/uL   Eosinophils Relative 1 %   Eosinophils Absolute 0.1 0.0 - 0.5 K/uL   Basophils Relative 1 %   Basophils Absolute 0.1 0.0 - 0.1 K/uL   Immature Granulocytes 0 %   Abs Immature Granulocytes 0.02 0.00 - 0.07 K/uL    Comment: Performed at [facility], [address] [place], [place] [address]  Comprehensive metabolic panel     Status: Abnormal   Collection Time: 02/28/21 12:59 PM  Result Value Ref Range   Sodium 140 135 - 145 mmol/L   Potassium 4.1 3.5 - 5.1 mmol/L   Chloride 102 98 - 111 mmol/L   CO2 23 22 - 32 mmol/L   Glucose, Bld 88 70 - 99 mg/dL    Comment: Glucose reference range applies only to samples taken after fasting for at least 8 hours.   BUN 10 6 - 20 mg/dL   Creatinine, Ser 0.82 0.61 - 1.24 mg/dL   Calcium 9.3 8.9 - 10.3 mg/dL   Total Protein 7.9 6.5 - 8.1 g/dL   Albumin 4.3 3.5 - 5.0 g/dL   AST 57 (H) 15 - 41 U/L   ALT 47 (H) 0 - 44 U/L   Alkaline Phosphatase  61 38 - 126 U/L   Total Bilirubin 1.0 0.3 - 1.2 mg/dL   GFR,  Estimated >60 >60 mL/min    Comment: (NOTE) Calculated using the CKD-EPI Creatinine Equation (2021)    Anion gap 15 5 - 15    Comment: Performed at [facility], [address] [place], [place] [address]  B Nat Peptide     Status: None   Collection Time: 02/28/21 12:59 PM  Result Value Ref Range   B Natriuretic Peptide 36.3 0.0 - 100.0 pg/mL    Comment: Performed at [facility], [address] [place],  [address]  POCT HgB A1C     Status: Normal   Collection Time: 03/26/21  9:20 AM  Result Value Ref Range   Hemoglobin A1C 5.4 4.0 - 5.6 %   HbA1c POC (<> result, manual entry)     HbA1c, POC (prediabetic range)     HbA1c, POC (controlled diabetic range)       PHQ2/9: Depression screen [facility] 2/9 03/26/2021 02/28/2021 12/19/2020 09/26/2020 08/14/2020  Decreased Interest 0 0 0 0 0  Down, Depressed, Hopeless 0 0 0 0 0  PHQ - 2 Score 0 0 0 0 0  Altered sleeping - - 0 - 0  Tired, decreased energy - - 3 - 0  Change in appetite - - 0 - 0  Feeling bad or failure about yourself  - - 0 - 0  Trouble concentrating - - 0 - 0  Moving slowly or fidgety/restless - - 0 - 0  Suicidal thoughts - - 0 - 0  PHQ-9 Score - - 3 - 0  Difficult doing work/chores - - - - -  Some recent data might be hidden    phq 9 is negative   Fall Risk: Fall Risk  03/26/2021 02/28/2021 12/19/2020 09/26/2020 08/14/2020  Falls in the past year? 0 0 0 1 0  Number falls in past yr: 0 0 0 0 0  Injury with Fall? 0 - 0 0 0  Comment - - - - -  Risk for fall due to : - - - History of fall(s) -  Follow up - Falls prevention discussed - - -    Functional Status Survey: Is the patient deaf or have difficulty hearing?: No Does the patient have difficulty seeing, even when wearing glasses/contacts?: No Does the patient have difficulty concentrating, remembering, or making decisions?: No Does the patient have difficulty walking or climbing  stairs?: No Does the patient have difficulty dressing or bathing?: No Does the patient have difficulty doing errands alone such as visiting a doctor's office or shopping?: No   Assessment & Plan  1. Controlled type 2 diabetes mellitus with microalbuminuria, without long-term current use of insulin (HCC)  - POCT HgB A1C  2. Seizure ([place])   3. Essential hypertension  Continue medication   4. Controlled type 2 diabetes with neuropathy ([place])   5. Dyslipidemia associated with type 2 diabetes mellitus (HCC)  - ezetimibe (ZETIA) 10 MG tablet; Take 1 tablet (10 mg total) by mouth daily.  Dispense: 90 tablet; Refill: 1  6. Coronary artery disease of native artery of native heart with stable angina pectoris ([place])   7. Hypertension associated with diabetes ([place])   8. SOB (shortness of breath)  Keep follow up with pulmonologist  9. Coronary artery disease due to lipid rich plaque   10. Obesity (BMI 30.0-34.9)  Discussed with the patient the risk posed by an increased BMI. Discussed importance of portion control, calorie counting and at least 150 minutes of physical activity weekly. Avoid sweet beverages and drink  more water. Eat at least 6 servings of fruit and vegetables daily   11. Mixed hyperlipidemia   12. Sciatica of right side  - pregabalin (LYRICA) 50 MG capsule; Take 1 capsule (50 mg total) by mouth 3 (three) times daily.  Dispense: 270 capsule; Refill: 1  13. Cervical radiculitis  - pregabalin (LYRICA) 50 MG capsule; Take 1 capsule (50 mg total) by mouth 3 (three) times daily.  Dispense: 270 capsule; Refill: 1

## 2021-03-26 ENCOUNTER — Encounter: Payer: Self-pay | Admitting: Family Medicine

## 2021-03-26 ENCOUNTER — Ambulatory Visit: Payer: Medicaid Other | Admitting: Family Medicine

## 2021-03-26 ENCOUNTER — Other Ambulatory Visit: Payer: Self-pay

## 2021-03-26 VITALS — BP 140/86 | HR 89 | Temp 98.1°F | Resp 16 | Ht 71.0 in | Wt 224.0 lb

## 2021-03-26 DIAGNOSIS — R809 Proteinuria, unspecified: Secondary | ICD-10-CM

## 2021-03-26 DIAGNOSIS — M5412 Radiculopathy, cervical region: Secondary | ICD-10-CM

## 2021-03-26 DIAGNOSIS — I152 Hypertension secondary to endocrine disorders: Secondary | ICD-10-CM

## 2021-03-26 DIAGNOSIS — G4733 Obstructive sleep apnea (adult) (pediatric): Secondary | ICD-10-CM | POA: Diagnosis not present

## 2021-03-26 DIAGNOSIS — E1129 Type 2 diabetes mellitus with other diabetic kidney complication: Secondary | ICD-10-CM | POA: Diagnosis not present

## 2021-03-26 DIAGNOSIS — E1169 Type 2 diabetes mellitus with other specified complication: Secondary | ICD-10-CM | POA: Diagnosis not present

## 2021-03-26 DIAGNOSIS — E1159 Type 2 diabetes mellitus with other circulatory complications: Secondary | ICD-10-CM | POA: Diagnosis not present

## 2021-03-26 DIAGNOSIS — E785 Hyperlipidemia, unspecified: Secondary | ICD-10-CM

## 2021-03-26 DIAGNOSIS — E782 Mixed hyperlipidemia: Secondary | ICD-10-CM

## 2021-03-26 DIAGNOSIS — I2510 Atherosclerotic heart disease of native coronary artery without angina pectoris: Secondary | ICD-10-CM | POA: Diagnosis not present

## 2021-03-26 DIAGNOSIS — M5431 Sciatica, right side: Secondary | ICD-10-CM

## 2021-03-26 DIAGNOSIS — R569 Unspecified convulsions: Secondary | ICD-10-CM | POA: Diagnosis not present

## 2021-03-26 DIAGNOSIS — E669 Obesity, unspecified: Secondary | ICD-10-CM

## 2021-03-26 DIAGNOSIS — I25118 Atherosclerotic heart disease of native coronary artery with other forms of angina pectoris: Secondary | ICD-10-CM | POA: Diagnosis not present

## 2021-03-26 DIAGNOSIS — I10 Essential (primary) hypertension: Secondary | ICD-10-CM

## 2021-03-26 DIAGNOSIS — R0602 Shortness of breath: Secondary | ICD-10-CM

## 2021-03-26 DIAGNOSIS — E119 Type 2 diabetes mellitus without complications: Secondary | ICD-10-CM | POA: Diagnosis not present

## 2021-03-26 DIAGNOSIS — G4731 Primary central sleep apnea: Secondary | ICD-10-CM | POA: Diagnosis not present

## 2021-03-26 DIAGNOSIS — L03031 Cellulitis of right toe: Secondary | ICD-10-CM | POA: Diagnosis not present

## 2021-03-26 DIAGNOSIS — I2583 Coronary atherosclerosis due to lipid rich plaque: Secondary | ICD-10-CM

## 2021-03-26 DIAGNOSIS — E1140 Type 2 diabetes mellitus with diabetic neuropathy, unspecified: Secondary | ICD-10-CM | POA: Diagnosis not present

## 2021-03-26 LAB — POCT GLYCOSYLATED HEMOGLOBIN (HGB A1C): Hemoglobin A1C: 5.4 % (ref 4.0–5.6)

## 2021-03-26 MED ORDER — PREGABALIN 50 MG PO CAPS: 50.0000 mg | ORAL_CAPSULE | Freq: Three times a day (TID) | ORAL | 1 refills | Status: DC

## 2021-03-26 MED ORDER — EZETIMIBE 10 MG PO TABS: 10.0000 mg | ORAL_TABLET | Freq: Every day | ORAL | 1 refills | Status: DC

## 2021-03-27 ENCOUNTER — Ambulatory Visit (INDEPENDENT_AMBULATORY_CARE_PROVIDER_SITE_OTHER): Payer: Medicaid Other | Admitting: Pulmonary Disease

## 2021-03-27 ENCOUNTER — Encounter: Payer: Self-pay | Admitting: Pulmonary Disease

## 2021-03-27 VITALS — BP 138/82 | HR 72 | Temp 97.3°F | Ht 71.0 in | Wt 223.4 lb

## 2021-03-27 DIAGNOSIS — Z9989 Dependence on other enabling machines and devices: Secondary | ICD-10-CM

## 2021-03-27 DIAGNOSIS — K219 Gastro-esophageal reflux disease without esophagitis: Secondary | ICD-10-CM

## 2021-03-27 DIAGNOSIS — I2510 Atherosclerotic heart disease of native coronary artery without angina pectoris: Secondary | ICD-10-CM | POA: Diagnosis not present

## 2021-03-27 DIAGNOSIS — R0609 Other forms of dyspnea: Secondary | ICD-10-CM

## 2021-03-27 DIAGNOSIS — G4733 Obstructive sleep apnea (adult) (pediatric): Secondary | ICD-10-CM | POA: Diagnosis not present

## 2021-03-27 DIAGNOSIS — R0600 Dyspnea, unspecified: Secondary | ICD-10-CM

## 2021-03-27 MED ORDER — ESOMEPRAZOLE MAGNESIUM 40 MG PO PACK
40.0000 mg | PACK | Freq: Every day | ORAL | 3 refills | Status: DC
Start: 2021-03-27 — End: 2021-05-01

## 2021-03-27 NOTE — Progress Notes (Signed)
Subjective:    Patient ID: [name] [name], male    DOB: 12/29/1960, 60 y.o.   MRN: [id number]  HPI Patient is a 60 year old former smoker (quit 2006, less than 2 pack years) who presents for evaluation of dyspnea of 4 months duration.  Patient is kindly referred by Laurann Montana, NP.  Patient's primary care physician is Dr. Steele Sizer.  Patient states that he has had dyspnea for approximately 4 months which started quite sudden in onset.  He notes that he normally could walk 1 to 2 miles daily without any difficulty but now cannot even walk to his mailbox without feeling short of breath.  He also notices occasional dizziness with this.  He notes also some dry cough that occurs only when he is short of breath.  He is not sure about wheezing.  He does note severe gastroesophageal reflux symptoms.  He gets chest tightness when he gets short of breath.  Evaluation that included a stress test, left heart cath and echocardiogram.  Echo stress test was read as "low risk scan".  Left heart cath showed mid LAD lesion that was 30% stenosed, left ventricular systolic function that was normal LV end-diastolic pressure that was mildly elevated and left ventricular ejection fraction between 55 and 65%.  2D echo confirmed LVEF of 65% with grade 1 diastolic dysfunction no other abnormalities.  Patient also had a chest x-ray that showed no significant abnormalities.  Questionable mild air trapping.  Has been on a Combivent inhaler provided to him by his primary care physician without any improvement.  His only relief happens when he rests.  He has diabetes mellitus and has been well controlled and is compliant with diet and exercise as well as he can given his recent issues with dyspnea..  He has obstructive sleep apnea and has been on CPAP for 7 years and this is managed by primary care.  He has not had issues with the equipment.  No fevers, chills or sweats.  No weight loss or anorexia.  He has no military  history.  Does endorse a history of prior asbestos exposure in the 1990s.  Review of Systems A 10 point review of systems was performed and it is as noted above otherwise negative.  Past Medical History:  Diagnosis Date  . Allergy   . Angina, class III ([place])   . Chronic constipation   . Degeneration of intervertebral disc of cervical region   . Diabetes mellitus without complication ([place])   . Diastolic dysfunction    a. 06/2017 Echo: EF 60-65%, no rwma, Gr1 DD.  Marland Kitchen Dyspnea   . GERD (gastroesophageal reflux disease)   . Hernia 1991   02/10/2012-RIH repair  . Hyperlipidemia   . Hypertension 2008  . Nerve root pain   . Neuropathy   . Non-obstructive CAD (coronary artery disease)    a. 05/30/2015 cath: LM nl, mLAD 50% (FFR 0.83), LCx minor irregs, RCA minor irregs, EF 55-65%-->Med Rx; b. 03/2016 Cath: LM nl LAD 91m D1/2/3 min irregs, LCX min irregs, OM1/2/3 nl, RCA min irregs, RPDA/RPAV/RPL1/RPL2 nl-->Med Rx; b. 07/2020 Cath: LM nl, LAD 331mD1/2/3 min irregs, LCX min irregs, RCA min irregs, RPDA/RPAV/RPL1,2 nl, EF 55-65%.  . Obesity, unspecified 2012  . Personal history of tobacco use, presenting hazards to health 2012  . Polycythemia   . Rectal bleeding 02/11/2017  . Recurrent Right Inguinal Hernia Repair 02/09/2011, 04/07/2013   [facility]. Sleep apnea    a. On CPAP;  b. 06/2017 Echo: no PAH.  Marland Kitchen Umbilical hernia without mention of obstruction or gangrene 02/09/2011   Dr [name], [facility]  . Vitamin D deficiency    Past Surgical History:  Procedure Laterality Date  . BACK SURGERY  12/2008   X2-LUMBAR  . CARDIAC CATHETERIZATION N/A 05/30/2015   Procedure: Left Heart Cath;  Surgeon: Wellington Hampshire, MD;  Location: [place] [facility];  Service: Cardiovascular;  Laterality: N/A;  . CARDIAC CATHETERIZATION N/A 04/16/2016   Procedure: Left Heart Cath and Coronary Angiography;  Surgeon: Wellington Hampshire, MD;  Location: [place] [facility];  Service: Cardiovascular;  Laterality: N/A;  .  COLONOSCOPY  2014   Dr. [name]  . COLONOSCOPY WITH PROPOFOL N/A 12/25/2016   Procedure: COLONOSCOPY WITH PROPOFOL;  Surgeon: Jonathon Bellows, MD;  Location: [facility] ENDOSCOPY;  Service: Endoscopy;  Laterality: N/A;  . COLONOSCOPY WITH PROPOFOL N/A 01/29/2017   Procedure: COLONOSCOPY WITH PROPOFOL;  Surgeon: Jonathon Bellows, MD;  Location: [facility] ENDOSCOPY;  Service: Endoscopy;  Laterality: N/A;  . COLONOSCOPY WITH PROPOFOL N/A 04/11/2020   Procedure: COLONOSCOPY WITH PROPOFOL;  Surgeon: Jonathon Bellows, MD;  Location: [facility] ENDOSCOPY;  Service: Gastroenterology;  Laterality: N/A;  . ESOPHAGOGASTRODUODENOSCOPY (EGD) WITH PROPOFOL N/A 01/26/2019   Procedure: ESOPHAGOGASTRODUODENOSCOPY (EGD) WITH PROPOFOL;  Surgeon: Lin Landsman, MD;  Location: [facility] ENDOSCOPY;  Service: Gastroenterology;  Laterality: N/A;  . EVALUATION UNDER ANESTHESIA WITH HEMORRHOIDECTOMY N/A 02/24/2017   Procedure: EXAM UNDER ANESTHESIA WITH POSSIBLE EXCISION OF INTERNAL HEMORRHOIDS;  Surgeon: Olean Ree, MD;  Location: [facility] ORS;  Service: General;  Laterality: N/A;  . FISSURECTOMY  02/24/2017   Procedure: FISSURECTOMY;  Surgeon: Olean Ree, MD;  Location: [facility] ORS;  Service: General;;  . HAND SURGERY Left 2020  . HERNIA REPAIR Right 1991  . INGUINAL HERNIA REPAIR Right 2012   Dr [name]  . INGUINAL HERNIA REPAIR Right 2014   Dr [name]  . LEFT HEART CATH AND CORONARY ANGIOGRAPHY Left 08/05/2020   Procedure: LEFT HEART CATH AND CORONARY ANGIOGRAPHY poss PCI;  Surgeon: Wellington Hampshire, MD;  Location: [place] [facility];  Service: Cardiovascular;  Laterality: Left;   Family History  Problem Relation Age of Onset  . Heart Problems Mother   . Heart Problems Father 49       myocardial infarction  . Mental illness Brother   . Testicular cancer Paternal Uncle   . Other Maternal Aunt        COVID  . Breast cancer Cousin    Social History   Tobacco Use  . Smoking status: Former Smoker    Years: 2.00    Types: Cigars    Start date:  12/28/2002    Quit date: 06/04/2005    Years since quitting: 15.8  . Smokeless tobacco: Never Used  . Tobacco comment: quit 2006-smoked 1 cigar occ  Substance Use Topics  . Alcohol use: Yes    Alcohol/week: 1.0 standard drink    Types: 1 Glasses of wine per week    Comment: RARE [employer]   Allergies  Allergen Reactions  . Gabapentin Other (See Comments)    Groggy-Mood Changes  . Latex Rash  . Penicillins Hives and Rash    Has patient had a PCN reaction causing immediate rash, facial/tongue/throat swelling, SOB or lightheadedness with hypotension: Yes Has patient had a PCN reaction causing severe rash involving mucus membranes or skin necrosis: No Has patient had a PCN reaction that required hospitalization No Has patient had a PCN reaction occurring within the last 10  years: No If all of the above answers are "NO", then may proceed with Cephalosporin use.    Current Meds  Medication Sig  . ACCU-CHEK GUIDE test strip TEST BLOOD SUGAR UP TO TWICE DAILY  . Accu-Chek Softclix Lancets lancets TEST TWICE DAILY  . aspirin 81 MG tablet Take 81 mg by mouth daily.  Marland Kitchen atorvastatin (LIPITOR) 80 MG tablet TAKE 1 TABLET(80 MG) BY MOUTH DAILY  . blood glucose meter kit and supplies 1 each by Other route in the morning and at bedtime. Dispense based on patient and insurance preference. Use up to two  times daily. (FOR ICD-10 E10.9, E11.9).  . carvedilol (COREG) 6.25 MG tablet Take 1 tablet (6.25 mg total) by mouth 2 (two) times daily.  . cetirizine (ZYRTEC) 10 MG tablet Take 10 mg by mouth daily as needed for allergies.   Marland Kitchen dicyclomine (BENTYL) 10 MG capsule TAKE 1 CAPSULE BY MOUTH THREE TIMES A DAY BEFORE MEALS  . ezetimibe (ZETIA) 10 MG tablet Take 1 tablet (10 mg total) by mouth daily.  . fluticasone (FLONASE) 50 MCG/ACT nasal spray Place 2 sprays into the nose daily as needed for allergies or rhinitis.   . Ipratropium-Albuterol (COMBIVENT) 20-100 MCG/ACT AERS respimat Inhale 1 puff into the lungs  every 6 (six) hours.  . isosorbide mononitrate (IMDUR) 30 MG 24 hr tablet Take 15 mg by mouth daily.  Marland Kitchen ketoconazole (NIZORAL) 2 % shampoo Apply 1 application topically 3 (three) times a week. Wash scalp/body 3 times a week, let sit 5 minutes before rinsing off, avoid eyes  . losartan-hydrochlorothiazide (HYZAAR) 50-12.5 MG tablet Take 1 tablet by mouth daily.  Marland Kitchen lubiprostone (AMITIZA) 24 MCG capsule TAKE 1 CAPSULE(24 MCG) BY MOUTH TWICE DAILY WITH A MEAL  . NITROSTAT 0.4 MG SL tablet place 1 tablet under the tongue every 5 minutes for UP TO 3 doses if needed for angina as directed by prescriber  . pregabalin (LYRICA) 50 MG capsule Take 1 capsule (50 mg total) by mouth 3 (three) times daily.  . ranolazine (RANEXA) 1000 MG SR tablet Take 1 tablet (1,000 mg total) by mouth 2 (two) times daily.  . TRULICITY 1.5 XY/8.0XK SOPN ADMINISTER 1.5 MG UNDER THE SKIN 1 TIME A WEEK  . valsartan (DIOVAN) 160 MG tablet valsartan 160 mg tablet   Immunization History  Administered Date(s) Administered  . Influenza Inj Mdck Quad Pf 09/21/2019  . Influenza, Seasonal, Injecte, Preservative Fre 11/15/2012  . Influenza,inj,Quad PF,6+ Mos 10/10/2013, 08/24/2014, 09/05/2015, 09/25/2016, 10/21/2017, 10/05/2018, 09/26/2020  . Influenza-Unspecified 08/24/2014  . PFIZER(Purple Top)SARS-COV-2 Vaccination 03/15/2020, 04/05/2020, 10/20/2020  . Pneumococcal Conjugate-13 09/25/2016  . Pneumococcal Polysaccharide-23 06/05/2015  . Tdap 02/23/2013       Objective:   Physical Exam BP 138/82 (BP Location: Left Arm, Cuff Size: Normal)   Pulse 72   Temp (!) 97.3 F (36.3 C) (Temporal)   Ht _0  (1.803 m)   Wt 223 lb 6.4 oz (101.3 kg)   SpO2 98%   BMI 31.16 kg/m  GENERAL: Well developed well-nourished gentleman in no acute distress.  Fully ambulatory, mild conversational dyspnea. HEAD: Normocephalic, atraumatic.  EYES: Pupils equal, round, reactive to light.  No scleral icterus.  MOUTH: Nose/mouth/throat not examined  due to masking requirements for COVID 19. NECK: Supple. No thyromegaly. Trachea midline. No JVD.  No adenopathy. PULMONARY: Good air entry bilaterally.  No adventitious sounds.  Mild conversational dyspnea. CARDIOVASCULAR: S1 and S2. Regular rate and rhythm.  No rubs, murmurs or gallops heard. ABDOMEN: Benign. MUSCULOSKELETAL:  No joint deformity, no clubbing, no edema.  NEUROLOGIC: No focal deficit, no gait disturbance, speech is fluent. SKIN: Intact,warm,dry.  No rashes. PSYCH: Mood and behavior normal.  Ambulatory oximetry today which showed exercise limitation due to severe dizziness and shortness of breath.  His resting O2 sat was 94%.  At peak exercise 98%.  Patient however was only able to go 500 feet before becoming very short of breath and unable to continue.     Assessment & Plan:     ICD-10-CM   1. Dyspnea on exertion  R06.00 Pulmonary Function Test [facility] Only    CT ANGIO CHEST AORTA W/CM & OR WO/CM    CANCELED: CT ANGIO ABDOMEN W &/OR WO CONTRAST   Uncertain etiology at present Query chronic pulmonary emboli CT angio chest Query interstitial lung disease Cardiac evaluation has been nonrevealing  2. Gastroesophageal reflux disease, unspecified whether esophagitis present  K21.9    This issue adds complexity to his management Start Nexium 40 mg daily Antireflux measures  3. Atherosclerosis of native coronary artery of native heart without angina pectoris  I25.10    This issue adds complexity to his management Being followed by cardiology  4. OSA on CPAP  G47.33    Z99.89    Patient appears well compensated in this regard   Orders Placed This Encounter  Procedures  . CT ANGIO CHEST AORTA W/CM & OR WO/CM    Standing Status:   Future    Standing Expiration Date:   03/27/2022    Order Specific Question:   If indicated for the ordered procedure, I authorize the administration of contrast media per Radiology protocol    Answer:   Yes    Order Specific Question:   Preferred  imaging location?    Answer:   [facility]  . Pulmonary Function Test [facility] Only    Standing Status:   Future    Standing Expiration Date:   03/27/2022    Scheduling Instructions:     3 weeks    Order Specific Question:   Full PFT: includes the following: basic spirometry, spirometry pre & post bronchodilator, diffusion capacity (DLCO), lung volumes    Answer:   Full PFT   Meds ordered this encounter  Medications  . esomeprazole (NEXIUM) 40 MG packet    Sig: Take 40 mg by mouth daily before breakfast.    Dispense:  30 each    Refill:  3   We will see the patient in follow-up in 3 to 4 weeks time.  With either me or the nurse practitioner.  We will call the patient with results of tests as they become available.   Renold Don, MD [facility]   *This note was dictated using voice recognition software/Dragon.  Despite best efforts to proofread, errors can occur which can change the meaning.  Any change was purely unintentional.

## 2021-03-27 NOTE — Patient Instructions (Signed)
Your oxygen level was good when you walked and maintained at 98%.  We are going to get a special scan of the lungs as well as a lung test that will tell me more about your shortness of breath.  We also need to treat your reflux because this can affect your lungs.  A medication called esomeprazole (Nexium) was sent to your pharmacy for this.  Let us know if you have difficulties getting it.  For now I have no other medication to give you as I do not know what your lung test will show.  You may continue using your inhaler as you are doing now.  We will see you in follow-up in 3 to 4 weeks time with either me or the nurse practitioner.

## 2021-03-28 ENCOUNTER — Encounter: Payer: Self-pay | Admitting: Family Medicine

## 2021-04-01 ENCOUNTER — Encounter: Payer: Self-pay | Admitting: Family Medicine

## 2021-04-10 ENCOUNTER — Telehealth: Payer: Self-pay

## 2021-04-10 NOTE — Telephone Encounter (Signed)
Patient is aware of date/time of covid test prior to PFT.  

## 2021-04-14 ENCOUNTER — Other Ambulatory Visit: Payer: Self-pay

## 2021-04-14 ENCOUNTER — Other Ambulatory Visit
Admission: RE | Admit: 2021-04-14 | Discharge: 2021-04-14 | Disposition: A | Discharge status: Home / Self Care | Payer: Medicaid Other | Source: Ambulatory Visit | Attending: Pulmonary Disease | Admitting: Pulmonary Disease

## 2021-04-14 DIAGNOSIS — Z01812 Encounter for preprocedural laboratory examination: Secondary | ICD-10-CM | POA: Diagnosis not present

## 2021-04-14 DIAGNOSIS — Z20822 Contact with and (suspected) exposure to covid-19: Secondary | ICD-10-CM | POA: Insufficient documentation

## 2021-04-14 LAB — SARS CORONAVIRUS 2 (TAT 6-24 HRS): SARS Coronavirus 2: NEGATIVE

## 2021-04-15 ENCOUNTER — Ambulatory Visit: Payer: Medicaid Other | Attending: Pulmonary Disease

## 2021-04-15 DIAGNOSIS — R0600 Dyspnea, unspecified: Secondary | ICD-10-CM | POA: Diagnosis present

## 2021-04-15 DIAGNOSIS — R0609 Other forms of dyspnea: Secondary | ICD-10-CM

## 2021-04-15 DIAGNOSIS — Z87891 Personal history of nicotine dependence: Secondary | ICD-10-CM | POA: Diagnosis not present

## 2021-04-15 MED ORDER — ALBUTEROL SULFATE (2.5 MG/3ML) 0.083% IN NEBU
2.5000 mg | INHALATION_SOLUTION | Freq: Once | RESPIRATORY_TRACT | Status: AC
  Administered 2021-04-15: 2.5 mg via RESPIRATORY_TRACT
  Filled 2021-04-15: qty 3

## 2021-04-21 DIAGNOSIS — K219 Gastro-esophageal reflux disease without esophagitis: Secondary | ICD-10-CM | POA: Diagnosis not present

## 2021-04-21 DIAGNOSIS — G4733 Obstructive sleep apnea (adult) (pediatric): Secondary | ICD-10-CM | POA: Diagnosis not present

## 2021-04-21 DIAGNOSIS — R42 Dizziness and giddiness: Secondary | ICD-10-CM | POA: Diagnosis not present

## 2021-04-21 DIAGNOSIS — E119 Type 2 diabetes mellitus without complications: Secondary | ICD-10-CM | POA: Diagnosis not present

## 2021-04-21 DIAGNOSIS — Z91040 Latex allergy status: Secondary | ICD-10-CM | POA: Diagnosis not present

## 2021-04-21 DIAGNOSIS — Z7982 Long term (current) use of aspirin: Secondary | ICD-10-CM | POA: Diagnosis not present

## 2021-04-21 DIAGNOSIS — R0602 Shortness of breath: Secondary | ICD-10-CM | POA: Diagnosis not present

## 2021-04-21 DIAGNOSIS — I2510 Atherosclerotic heart disease of native coronary artery without angina pectoris: Secondary | ICD-10-CM | POA: Diagnosis not present

## 2021-04-21 DIAGNOSIS — Z7984 Long term (current) use of oral hypoglycemic drugs: Secondary | ICD-10-CM | POA: Diagnosis not present

## 2021-04-21 DIAGNOSIS — Z87891 Personal history of nicotine dependence: Secondary | ICD-10-CM | POA: Diagnosis not present

## 2021-04-21 DIAGNOSIS — Z79899 Other long term (current) drug therapy: Secondary | ICD-10-CM | POA: Diagnosis not present

## 2021-04-21 DIAGNOSIS — J386 Stenosis of larynx: Secondary | ICD-10-CM | POA: Diagnosis not present

## 2021-04-21 DIAGNOSIS — R1310 Dysphagia, unspecified: Secondary | ICD-10-CM | POA: Diagnosis not present

## 2021-04-21 DIAGNOSIS — Z880 Allergy status to penicillin: Secondary | ICD-10-CM | POA: Diagnosis not present

## 2021-04-21 DIAGNOSIS — E042 Nontoxic multinodular goiter: Secondary | ICD-10-CM | POA: Diagnosis not present

## 2021-04-21 DIAGNOSIS — M47812 Spondylosis without myelopathy or radiculopathy, cervical region: Secondary | ICD-10-CM | POA: Diagnosis not present

## 2021-04-21 DIAGNOSIS — R499 Unspecified voice and resonance disorder: Secondary | ICD-10-CM | POA: Diagnosis not present

## 2021-04-21 DIAGNOSIS — M25512 Pain in left shoulder: Secondary | ICD-10-CM | POA: Diagnosis not present

## 2021-04-21 DIAGNOSIS — Z791 Long term (current) use of non-steroidal anti-inflammatories (NSAID): Secondary | ICD-10-CM | POA: Diagnosis not present

## 2021-04-21 DIAGNOSIS — I10 Essential (primary) hypertension: Secondary | ICD-10-CM | POA: Diagnosis not present

## 2021-04-21 DIAGNOSIS — E041 Nontoxic single thyroid nodule: Secondary | ICD-10-CM | POA: Diagnosis not present

## 2021-04-21 DIAGNOSIS — R0600 Dyspnea, unspecified: Secondary | ICD-10-CM | POA: Diagnosis not present

## 2021-04-21 DIAGNOSIS — E785 Hyperlipidemia, unspecified: Secondary | ICD-10-CM | POA: Diagnosis not present

## 2021-04-21 DIAGNOSIS — J029 Acute pharyngitis, unspecified: Secondary | ICD-10-CM | POA: Diagnosis not present

## 2021-04-21 DIAGNOSIS — M1991 Primary osteoarthritis, unspecified site: Secondary | ICD-10-CM | POA: Diagnosis not present

## 2021-04-22 ENCOUNTER — Telehealth: Payer: Self-pay | Admitting: Pulmonary Disease

## 2021-04-22 ENCOUNTER — Telehealth: Payer: Self-pay | Admitting: *Deleted

## 2021-04-22 ENCOUNTER — Other Ambulatory Visit: Payer: Self-pay

## 2021-04-22 MED ORDER — BREO ELLIPTA 100-25 MCG/INH IN AEPB: 1.0000 | INHALATION_SPRAY | Freq: Every day | RESPIRATORY_TRACT | 11 refills | Status: AC

## 2021-04-22 NOTE — Telephone Encounter (Signed)
Transition Care Management Follow-up Telephone Call  Date of discharge and from where: 04/21/2021 [facility]  How have you been since you were released from the hospital? "Okay"  Any questions or concerns? No  Items Reviewed:  Did the pt receive and understand the discharge instructions provided? Yes   Medications obtained and verified? Yes   Other? No   Any new allergies since your discharge? No   Dietary orders reviewed? No  Do you have support at home? Yes   Functional Questionnaire: (I = Independent and D = Dependent) ADLs: I  Bathing/Dressing- I  Meal Prep- I  Eating- I  Maintaining continence- I  Transferring/Ambulation- I  Managing Meds- I  Follow up appointments reviewed:   PCP Hospital f/u appt confirmed? No  - has a routine appointment in June  Specialist Hospital f/u appt confirmed? No    Are transportation arrangements needed? No   If their condition worsens, is the pt aware to call PCP or go to the Emergency Dept.? Yes  Was the patient provided with contact information for the PCP's office or ED? Yes  Was to pt encouraged to call back with questions or concerns? Yes

## 2021-04-22 NOTE — Telephone Encounter (Addendum)
PA for [employer] has been started via CMM.  NGE:[id number]  Will await determination.

## 2021-04-23 DIAGNOSIS — E119 Type 2 diabetes mellitus without complications: Secondary | ICD-10-CM | POA: Diagnosis not present

## 2021-04-23 DIAGNOSIS — L03031 Cellulitis of right toe: Secondary | ICD-10-CM | POA: Diagnosis not present

## 2021-04-24 DIAGNOSIS — R42 Dizziness and giddiness: Secondary | ICD-10-CM | POA: Diagnosis not present

## 2021-04-24 DIAGNOSIS — R531 Weakness: Secondary | ICD-10-CM | POA: Diagnosis not present

## 2021-04-24 DIAGNOSIS — R413 Other amnesia: Secondary | ICD-10-CM | POA: Diagnosis not present

## 2021-04-24 DIAGNOSIS — R0602 Shortness of breath: Secondary | ICD-10-CM | POA: Diagnosis not present

## 2021-04-25 ENCOUNTER — Other Ambulatory Visit: Payer: Self-pay | Admitting: Family Medicine

## 2021-04-25 DIAGNOSIS — R0602 Shortness of breath: Secondary | ICD-10-CM | POA: Diagnosis not present

## 2021-04-25 DIAGNOSIS — R0600 Dyspnea, unspecified: Secondary | ICD-10-CM | POA: Diagnosis not present

## 2021-04-25 NOTE — Telephone Encounter (Signed)
Requested medication (s) are due for refill today: yes  Requested medication (s) are on the active medication list: yes  Last refill: 09/29/19  Future visit scheduled: yes  Notes to clinic:  historical provider   Requested Prescriptions  Pending Prescriptions Disp Refills   valsartan (DIOVAN) 160 MG tablet [Pharmacy Med Name: VALSARTAN 160MG  TABLETS] 90 tablet     Sig: TAKE 1 TABLET(160 MG) BY MOUTH DAILY      Cardiovascular:  Angiotensin Receptor Blockers Passed - 04/25/2021  9:21 AM      Passed - Cr in normal range and within 180 days    Creat  Date Value Ref Range Status  09/26/2020 0.92 0.70 - 1.33 mg/dL Final    Comment:    For patients >55 years of age, the reference limit for Creatinine is approximately 13% higher for people identified as African-American. .    Creatinine, Ser  Date Value Ref Range Status  02/28/2021 0.82 0.61 - 1.24 mg/dL Final   Creatinine, Urine  Date Value Ref Range Status  09/26/2020 426 (H) 20 - 320 mg/dL Final    Comment:    Verified by repeat analysis. .           Passed - K in normal range and within 180 days    Potassium  Date Value Ref Range Status  02/28/2021 4.1 3.5 - 5.1 mmol/L Final          Passed - Patient is not pregnant      Passed - Last BP in normal range    BP Readings from Last 1 Encounters:  03/27/21 138/82          Passed - Valid encounter within last 6 months    Recent Outpatient Visits           1 month ago Dyslipidemia associated with type 2 diabetes mellitus [facility])   [facility] [place], Drue Stager, MD   1 month ago SOB (shortness of breath)   [facility] [place], Drue Stager, MD   4 months ago Controlled type 2 diabetes mellitus with microalbuminuria, without long-term current use of insulin [facility])   [facility] [place], Drue Stager, MD   7 months ago Controlled type 2 diabetes mellitus with microalbuminuria, without long-term current use of insulin [facility])    [facility] [place], Drue Stager, MD   8 months ago Obesity (BMI 30.0-34.9)   [facility] Steele Sizer, MD       Future Appointments             In 3 days Ralene Bathe, MD [place]   In 2 months Steele Sizer, MD [facility]

## 2021-04-25 NOTE — Telephone Encounter (Signed)
Pt has an appt on 06/26/21

## 2021-04-25 NOTE — Telephone Encounter (Signed)
Pt stated that he doesn't need the valsartan because he is taking losartan which is what he prefers.

## 2021-04-25 NOTE — Telephone Encounter (Signed)
Per CMM, PA is not needed.  Spoke to [employer] at [employer], who stated that [employer] is covered with a three dollar co pay. Nothing further is needed at this time.

## 2021-04-28 ENCOUNTER — Ambulatory Visit (INDEPENDENT_AMBULATORY_CARE_PROVIDER_SITE_OTHER): Payer: Medicaid Other | Admitting: Dermatology

## 2021-04-28 ENCOUNTER — Other Ambulatory Visit: Payer: Self-pay

## 2021-04-28 DIAGNOSIS — B360 Pityriasis versicolor: Secondary | ICD-10-CM | POA: Diagnosis not present

## 2021-04-28 MED ORDER — KETOCONAZOLE 2 % EX SHAM: 1.0000 "application " | MEDICATED_SHAMPOO | CUTANEOUS | 11 refills | Status: DC

## 2021-04-28 NOTE — Progress Notes (Signed)
   Follow-Up Visit   Subjective  [name] [name] is a 60 y.o. male who presents for the following: Follow-up (Patient here for bx proven tinea versicolor follow up. He has taken terbinafine, diflucan and uses ketoconazole 2% shampoo as needed. Patient advises he is clear. ).  The following portions of the chart were reviewed this encounter and updated as appropriate:   Tobacco  Allergies  Meds  Problems  Med Hx  Surg Hx  Fam Hx     Review of Systems:  No other skin or systemic complaints except as noted in HPI or Assessment and Plan.  Objective  Well appearing patient in no apparent distress; mood and affect are within normal limits.  A focused examination was performed including face, neck, chest and back. Relevant physical exam findings are noted in the Assessment and Plan.  Objective  Chest - Medial [facility]): Clear    Assessment & Plan  Tinea versicolor -chronic and recurrent but improved and clear today. Chest - Medial [facility]) Bx proven Continue ketoconazole 2% shampoo once monthly for maintenance, may increase to TIW as needed for flares.   Reordered Medications ketoconazole (NIZORAL) 2 % shampoo  Return if symptoms worsen or fail to improve.  Graciella Belton, RMA, am acting as scribe for Sarina Ser, MD . Documentation: I have reviewed the above documentation for accuracy and completeness, and I agree with the above.  Sarina Ser, MD

## 2021-04-29 ENCOUNTER — Encounter: Payer: Self-pay | Admitting: Dermatology

## 2021-05-01 ENCOUNTER — Other Ambulatory Visit: Payer: Self-pay

## 2021-05-01 ENCOUNTER — Ambulatory Visit (INDEPENDENT_AMBULATORY_CARE_PROVIDER_SITE_OTHER): Payer: Medicaid Other | Admitting: Family

## 2021-05-01 ENCOUNTER — Encounter: Payer: Self-pay | Admitting: Family

## 2021-05-01 VITALS — BP 120/88 | HR 76 | Ht 71.0 in | Wt 222.2 lb

## 2021-05-01 DIAGNOSIS — R0609 Other forms of dyspnea: Secondary | ICD-10-CM

## 2021-05-01 DIAGNOSIS — R002 Palpitations: Secondary | ICD-10-CM | POA: Diagnosis not present

## 2021-05-01 DIAGNOSIS — E785 Hyperlipidemia, unspecified: Secondary | ICD-10-CM | POA: Diagnosis not present

## 2021-05-01 DIAGNOSIS — I10 Essential (primary) hypertension: Secondary | ICD-10-CM | POA: Diagnosis not present

## 2021-05-01 DIAGNOSIS — R0600 Dyspnea, unspecified: Secondary | ICD-10-CM

## 2021-05-01 DIAGNOSIS — I25118 Atherosclerotic heart disease of native coronary artery with other forms of angina pectoris: Secondary | ICD-10-CM | POA: Diagnosis not present

## 2021-05-01 DIAGNOSIS — K219 Gastro-esophageal reflux disease without esophagitis: Secondary | ICD-10-CM | POA: Diagnosis not present

## 2021-05-01 MED ORDER — PANTOPRAZOLE SODIUM 40 MG PO TBEC: 40.0000 mg | DELAYED_RELEASE_TABLET | Freq: Every day | ORAL | 3 refills | Status: DC

## 2021-05-01 NOTE — Progress Notes (Signed)
Office Visit    Patient Name: [name] [name] Date of Encounter: 05/01/2021  Primary Care Provider:  Steele Sizer, MD Primary Cardiologist:  [name], MD Electrophysiologist:  None   Chief Complaint    [name] [name] is a 60 y.o. 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 Allergy   . Angina, class III ([place])   . Chronic constipation   . Degeneration of intervertebral disc of cervical region   . Diabetes mellitus without complication ([place])   . Diastolic dysfunction    a. 06/2017 Echo: EF 60-65%, no rwma, Gr1 DD.  Marland Kitchen Dyspnea   . GERD (gastroesophageal reflux disease)   . Hernia 1991   02/10/2012-RIH repair  . Hyperlipidemia   . Hypertension 2008  . Nerve root pain   . Neuropathy   . Non-obstructive CAD (coronary artery disease)    a. 05/30/2015 cath: LM nl, mLAD 50% (FFR 0.83), LCx minor irregs, RCA minor irregs, EF 55-65%-->Med Rx; b. 03/2016 Cath: LM nl LAD 35m D1/2/3 min irregs, LCX min irregs, OM1/2/3 nl, RCA min irregs, RPDA/RPAV/RPL1/RPL2 nl-->Med Rx; b. 07/2020 Cath: LM nl, LAD 392mD1/2/3 min irregs, LCX min irregs, RCA min irregs, RPDA/RPAV/RPL1,2 nl, EF 55-65%.  . Obesity, unspecified 2012  . Personal history of tobacco use, presenting hazards to health 2012  . Polycythemia   . Rectal bleeding 02/11/2017  . Recurrent Right Inguinal Hernia Repair 02/09/2011, 04/07/2013   [facility]. Sleep apnea    a. On CPAP;  b. 06/2017 Echo: no PAH.  . Marland Kitchenmbilical hernia without mention of obstruction or gangrene 02/09/2011   [facility]. Vitamin D deficiency    Past Surgical History:  Procedure Laterality Date  . BACK SURGERY  12/2008   X2-LUMBAR  . CARDIAC CATHETERIZATION N/A 05/30/2015   Procedure: Left Heart Cath;  Surgeon: MuWellington HampshireMD;  [facility];  Service: Cardiovascular;  Laterality: N/A;  . CARDIAC CATHETERIZATION N/A 04/16/2016   Procedure: Left Heart Cath and Coronary Angiography;  Surgeon: MuWellington HampshireMD;  [facility];  Service: Cardiovascular;  Laterality: N/A;  . COLONOSCOPY  2014   Dr. SaJamal Collin. COLONOSCOPY WITH PROPOFOL N/A 12/25/2016   Procedure: COLONOSCOPY WITH PROPOFOL;  Surgeon: KiJonathon BellowsMD;  Location: [facility];  Service: Endoscopy;  Laterality: N/A;  . COLONOSCOPY WITH PROPOFOL N/A 01/29/2017   Procedure: COLONOSCOPY WITH PROPOFOL;  Surgeon: KiJonathon BellowsMD;  Location: [facility];  Service: Endoscopy;  Laterality: N/A;  . COLONOSCOPY WITH PROPOFOL N/A 04/11/2020   Procedure: COLONOSCOPY WITH PROPOFOL;  Surgeon: AnJonathon BellowsMD;  [facility];  Service: Gastroenterology;  Laterality: N/A;  . ESOPHAGOGASTRODUODENOSCOPY (EGD) WITH PROPOFOL N/A 01/26/2019   Procedure: ESOPHAGOGASTRODUODENOSCOPY (EGD) WITH PROPOFOL;  Surgeon: VaLin LandsmanMD;  [facility];  Service: Gastroenterology;  Laterality: N/A;  . EVALUATION UNDER ANESTHESIA WITH HEMORRHOIDECTOMY N/A 02/24/2017   Procedure: EXAM UNDER ANESTHESIA WITH POSSIBLE EXCISION OF INTERNAL HEMORRHOIDS;  Surgeon: JoOlean ReeMD;  Location: [facility] ORS;  Service: General;  Laterality: N/A;  . FISSURECTOMY  02/24/2017   Procedure: FISSURECTOMY;  Surgeon: JoOlean ReeMD;  Location: [facility] ORS;  Service: General;;  . HAND SURGERY Left 2020  . HERNIA REPAIR Right 1991  . INGUINAL  HERNIA REPAIR Right 2012   Dr [name]  . INGUINAL HERNIA REPAIR Right 2014   Dr [name]  . LEFT HEART CATH AND CORONARY ANGIOGRAPHY Left 08/05/2020   Procedure: LEFT HEART CATH AND CORONARY ANGIOGRAPHY poss PCI;  Surgeon: Wellington Hampshire, MD;  Location: [place] [facility] LAB;  Service: Cardiovascular;  Laterality: Left;    Allergies  Allergies  Allergen Reactions  . Gabapentin Other (See Comments)     Groggy-Mood Changes  . Latex Rash  . Penicillins Hives and Rash    Has patient had a PCN reaction causing immediate rash, facial/tongue/throat swelling, SOB or lightheadedness with hypotension: Yes Has patient had a PCN reaction causing severe rash involving mucus membranes or skin necrosis: No Has patient had a PCN reaction that required hospitalization No Has patient had a PCN reaction occurring within the last 10 years: No If all of the above answers are "NO", then may proceed with Cephalosporin use.     History of Present Illness    [name] [name] is a 60 y.o. male with a hx of mild to moderate one-vessel coronary artery disease with suspected endothelial dysfunction, DM2, hypertension, hyperlipidemia, obesity, OSA on CPAP, syncope  last seen 09/26/20 by Dr. Fletcher Anon.  Cardiac catheterization 05/2015 with moderate mod LAD stenosis (50%) with FFR 0.83. Normal LVEF with normal LVEDP. Due to worsening anginal 03/2016 underwent repeat cardiac cath with improvement in mid-LAD stenosis to 30% and no evidence of obstructive disease.   He had recurrent episodes of loss of consciousness in 6659 of uncertain etiology. Echo at that time with normal LVEF. 2 week ZIO monitor with no significant arrhytmia. Evaluated by neurology as well with no clear etiology. He did have recurrent concussions while growing up playing sports.  He had recurrent angina 07/2017 with cath one vessel CAD of 30% stenosis to mis LAD. EF normal with mildly elevated LVEDP. He was treated medically with low dose Imdur.   He was seen in clinic January 2022 noting worsening exertional dyspnea and intermittent lightheadedness.  He was understandably frustrated as he has 15 grandchildren and that he wants to spend more time with.  Subsequent stress test was low risk with no evidence of significant ischemia and CT attenuation images showing no coronary calcification in LAD and RCA.  He was recommended for echocardiogram and Lexiscan  Myoview.  Echo 02/14/2021 LVEF 60 to 65%, no wall motion, mild LVH, grade 1 diastolic dysfunction, no significant valvular abnormalities.  He was subsequently referred to pulmonology.  PFTs 04/15/2021 with mild obstructive airway disease without significant bronchodilator response.  He was unimproved on Combivent and was transitioned to [place].  [facility] visit to [facility] for 04/21/2021 with CTA chest showing no pulmonary embolism, coronary calcifications, and mildly prominent thyroid gland with minimal associated effacement of trachea.  He was recommended for outpatient thyroid ultrasound.  He presents today for follow-up with his wife.  Tells me he just picked up his new inhaler on Monday.  We reviewed his cardiac testing in detail.  He tells me since last seen he has seen ENT and had a "scope down my nose" which was unrevealing.  He has not been taking Nexium as prescribed pulmonology at recent clinic visit as it was a packet he tells me the taste is bad.  No previous difficulty with pills.  He does have upcoming appoint with neurosurgeon regarding previous break in his neck in 1981 and wonders if that could be contributory to his dyspnea. Notes his dyspnea on  exertion continues to progress.  Reports occasional chest tightness when he gets dyspneic.. Tells me he feels he needs to clear his throat but nothing is in there.   EKGs/Labs/Other Studies Reviewed:   The following studies were reviewed today: CT neck soft tissue with contrast 04/21/2021 No CT finding to suggest acute pathology in the neck.    ====================  ADDENDUM (04/21/2021 6:08 PM):  On review, the following additional findings were noted:   There is a mildly redundant appearance of the aryepiglottic folds without a discrete lesion. There is mild narrowing of the trachea in transverse dimension at the level of the thoracic inlet (4:76), possibly due to a mildly prominent thyroid. However, the airway appears overall patent. Tiny  scattered thyroid nodules.   There are scattered atherosclerotic calcifications in the proximal internal carotid arteries, carotid siphons, aortic arch, and proximal great vessels without significant stenosis.   CTA chest with contrast 04/21/2021  IMPRESSION:   -No pulmonary embolism.    ====================  ADDENDUM (04/21/2021 8:36 PM):  On review, the following additional findings were noted:   -Coronary artery calcifications.  -Mildly prominent thyroid gland with minimal associated effacement of the trachea at the level of the thoracic and inlet, as better appreciated on concurrent CT neck.   Echo 01/2021  1. Left ventricular ejection fraction, by estimation, is 60 to 65%. The  left ventricle has normal function. The left ventricle has no regional  wall motion abnormalities. There is mild left ventricular hypertrophy.  Left ventricular diastolic parameters  are consistent with Grade I diastolic dysfunction (impaired relaxation).  The average left ventricular global longitudinal strain is -17.4 %. The  global longitudinal strain is normal.   2. Right ventricular systolic function is normal. The right ventricular  size is normal. Tricuspid regurgitation signal is inadequate for assessing  PA pressure.   Lexiscan 12/2020 Pharmacological myocardial perfusion imaging study with no significant  ischemia Normal wall motion, EF estimated at 60% No EKG changes concerning for ischemia at peak stress or in recovery. CT attenuation correction imaging: Coronary calcification noted predominantly in the LAD and RCA no significant aortic atherosclerosis noted low risk scan    [facility]  Mid LAD lesion is 30% stenosed.  The left ventricular systolic function is normal.  LV end diastolic pressure is mildly elevated.  The left ventricular ejection fraction is 55-65% by visual estimate.   1.  Mild nonobstructive one-vessel coronary artery disease.  Mid LAD stenosis does not appear to be  any worse since most recent catheterization. 2.  Normal LV systolic function and mildly elevated left ventricular end-diastolic pressure.   Recommendations: Continue medical therapy for stable angina which does not seem to be due to obstructive coronary artery disease.  Monitor 03/22/18 Normal sinus rhythm. Average heart rate was 74 bpm. Rare PVCs and PACs. Overall, no significant arrhythmia  Echo 07/22/17 Left ventricle: The cavity size was normal. There was moderate    concentric hypertrophy. Systolic function was normal. The    estimated ejection fraction was in the range of 60% to 65%. Wall    motion was normal; there were no regional wall motion    abnormalities. Doppler parameters are consistent with abnormal    left ventricular relaxation (grade 1 diastolic dysfunction).   -------------------------------------------------------------------  Study data:  No prior study was available for comparison.  Study  status:  Routine.  Procedure:  Transthoracic echocardiography.  Image quality was good.  Study completion:  There were no  complications.  Transthoracic echocardiography.  M-mode,  complete 2D, spectral Doppler, and color Doppler.  Birthdate:  Patient birthdate: 17-Dec-1961.  Age:  Patient is 60 yr old.  Sex:  Gender: male.    BMI: 35.7 kg/m^2.  Blood pressure:     150/88  Patient status:  Outpatient.  Study date:  Study date: 07/22/2017.  Study time: 01:29 PM.   EKG:  EKG is ordered today.  The ekg ordered today demonstrates normal sinus rhythm 76 bpm with no acute ST/T wave changes.  Recent Labs: 02/28/2021: ALT 47; B Natriuretic Peptide 36.3; BUN 10; Creatinine, Ser 0.82; Hemoglobin 17.1; Platelets 205; Potassium 4.1; Sodium 140  Recent Lipid Panel    Component Value Date/Time   CHOL 134 09/26/2020 1204   CHOL 167 05/08/2016 0803   TRIG 121 09/26/2020 1204   HDL 46 09/26/2020 1204   HDL 52 05/08/2016 0803   CHOLHDL 2.9 09/26/2020 1204   LDLCALC 68 09/26/2020  1204   Home Medications   Current Meds  Medication Sig  . ACCU-CHEK GUIDE test strip TEST BLOOD SUGAR UP TO TWICE DAILY  . Accu-Chek Softclix Lancets lancets TEST TWICE DAILY  . aspirin 81 MG tablet Take 81 mg by mouth daily.  Marland Kitchen atorvastatin (LIPITOR) 80 MG tablet TAKE 1 TABLET(80 MG) BY MOUTH DAILY  . blood glucose meter kit and supplies 1 each by Other route in the morning and at bedtime. Dispense based on patient and insurance preference. Use up to two  times daily. (FOR ICD-10 E10.9, E11.9).  . carvedilol (COREG) 6.25 MG tablet Take 1 tablet (6.25 mg total) by mouth 2 (two) times daily.  . cetirizine (ZYRTEC) 10 MG tablet Take 10 mg by mouth daily as needed for allergies.   Marland Kitchen dicyclomine (BENTYL) 10 MG capsule TAKE 1 CAPSULE BY MOUTH THREE TIMES A DAY BEFORE MEALS  . ezetimibe (ZETIA) 10 MG tablet Take 1 tablet (10 mg total) by mouth daily.  . fluticasone (FLONASE) 50 MCG/ACT nasal spray Place 2 sprays into the nose daily as needed for allergies or rhinitis.   . fluticasone furoate-vilanterol (BREO ELLIPTA) 100-25 MCG/INH AEPB Inhale 1 puff into the lungs daily.  . Ipratropium-Albuterol (COMBIVENT) 20-100 MCG/ACT AERS respimat Inhale 1 puff into the lungs every 6 (six) hours.  . isosorbide mononitrate (IMDUR) 30 MG 24 hr tablet Take 15 mg by mouth daily.  Marland Kitchen ketoconazole (NIZORAL) 2 % shampoo Apply 1 application topically 3 (three) times a week. Wash scalp/body 3 times a week as needed for alres, let sit 5 minutes before rinsing off, avoid eyes. May use once monthly for maintenance.  Marland Kitchen losartan-hydrochlorothiazide (HYZAAR) 50-12.5 MG tablet Take 1 tablet by mouth daily.  Marland Kitchen lubiprostone (AMITIZA) 24 MCG capsule TAKE 1 CAPSULE(24 MCG) BY MOUTH TWICE DAILY WITH A MEAL  . NITROSTAT 0.4 MG SL tablet place 1 tablet under the tongue every 5 minutes for UP TO 3 doses if needed for angina as directed by prescriber  . pantoprazole (PROTONIX) 40 MG tablet Take 1 tablet (40 mg total) by mouth daily.   . pregabalin (LYRICA) 50 MG capsule Take 1 capsule (50 mg total) by mouth 3 (three) times daily.  . ranolazine (RANEXA) 1000 MG SR tablet Take 1 tablet (1,000 mg total) by mouth 2 (two) times daily.  . TRULICITY 1.5 MG/0.5ML SOPN ADMINISTER 1.5 MG UNDER THE SKIN 1 TIME A WEEK  . [DISCONTINUED] esomeprazole (NEXIUM) 40 MG packet Take 40 mg by mouth daily before breakfast.     Review of Systems  All other systems reviewed and are otherwise negative except as noted above.  Physical Exam    VS:  BP 120/88 (BP Location: Left Arm, Patient Position: Sitting, Cuff Size: Large)   Pulse 76   Ht _0  (1.803 m)   Wt 222 lb 4 oz (100.8 kg)   SpO2 98%   BMI 31.00 kg/m  , BMI Body mass index is 31 kg/m.  Wt Readings from Last 3 Encounters:  05/01/21 222 lb 4 oz (100.8 kg)  03/27/21 223 lb 6.4 oz (101.3 kg)  03/26/21 224 lb (101.6 kg)    GEN: Well nourished, overweight, well developed, in no acute distress. HEENT: normal. Neck: Supple, no JVD, carotid bruits, or masses. Cardiac: RRR, no murmurs, rubs, or gallops. No clubbing, cyanosis, edema.  Radials/DP/PT 2+ and equal bilaterally.  Respiratory:  Respirations regular and unlabored, clear to auscultation bilaterally. GI: Soft, nontender, nondistended. MS: No deformity or atrophy. Skin: Warm and dry, no rash. Neuro:  Strength and sensation are intact. Psych: Normal affect.  Assessment & Plan    1. Mild-moderate CAD with 30% stenosis to LAD by [facility] 07/2020 / DOE-  EKG today NSR with no acute ST/T wave changes.  Lexiscan Myoview 01/2021 low risk scan with no significant ischemia, normal LVEF, no EKG changes, CT attenuation imaging with calcification in LAD and RCA.  Echocardiogram 4/22 normal LVEF, mild LVH, grade 1 diastolic dysfunction, no significant valvular abnormalities.  No signs of cardiac etiology of dyspnea on exertion.  Continue to follow with pulmonology.  After recent PFTs he was transition from Combivent to [place] which he just  started a few days ago.  He likely follow-up pulmonology.  We did discuss possibility of CPX testing if breathing not improved with his inhaler and pulmonology team thinks that will be of benefit. Will route to pulmonology team for input.  2. HLD, LDL goal <70 -09/26/2020 LDL 68.  Continue atorvastatin 80 mg daily, Zetia 10 mg daily.  3. HTN - BP well controlled. Continue current antihypertensive regimen.   4. DM2 -continue to follow with PCP.  5. GERD - Not tolerating Nexium packet due to taste.  Stop Nexium.  Start Protonix 40 mg daily.  6. Near-syncope / Lightheadedness -previous ZIO monitor 2019 with no significant arrhythmia.  Orthostatic vitals 12/2020.  Reports he only gets lightheadedness when he feels significantly shortness of breath.  Continue to follow with pulmonology, as above.  Recent echocardiogram with no significant valvular abnormalities.  7. Palpitations -No recurrence.  Lab work 02/28/2021 normal electrolytes, kidney function, hemoglobin 17.1.  No indication for further evaluation this time.  Recent echo with no significant valvular abnormalities.   8. OSA -Reports compliance with the CPAP.  Continued CPAP compliance encouraged.  Disposition: Follow up in 3 month(s) with Dr. Fletcher Anon or APP.   Signed, Loel Dubonnet, NP 05/01/2021, 9:39 AM [place]

## 2021-05-01 NOTE — Patient Instructions (Addendum)
Medication Instructions:  Your physician has recommended you make the following change in your medication:   STOP Nexium  START Protonix 40mg  daily  *If you need a refill on your cardiac medications before your next appointment, please call your pharmacy*   Lab Work: None ordered today.   If you have labs (blood work) drawn today and your tests are completely normal, you will receive your results only by: Marland Kitchen MyChart Message (if you have MyChart) OR . A paper copy in the mail If you have any lab test that is abnormal or we need to change your treatment, we will call you to review the results.   Testing/Procedures: Your stress test showed no significant blockages.    Follow-Up: At [facility], you and your health needs are our priority.  As part of our continuing mission to provide you with exceptional heart care, we have created designated Provider Care Teams.  These Care Teams include your primary Cardiologist (physician) and Advanced Practice Providers (APPs -  Physician Assistants and Nurse Practitioners) who all work together to provide you with the care you need, when you need it.  We recommend signing up for the patient portal called "MyChart".  Sign up information is provided on this After Visit Summary.  MyChart is used to connect with patients for Virtual Visits (Telemedicine).  Patients are able to view lab/test results, encounter notes, upcoming appointments, etc.  Non-urgent messages can be sent to your provider as well.   To learn more about what you can do with MyChart, go to [email or web address].    Your next appointment:   3 month(s)  The format for your next appointment:   In Person  Provider:   You may see [name], MD or one of the following Advanced Practice Providers on your designated Care Team:    Murray Hodgkins, NP  Christell Faith, PA-C  Marrianne Mood, PA-C  Cadence Kathlen Mody, [place]  Other Instructions  Heart Healthy Diet  Recommendations: A low-salt diet is recommended. Meats should be grilled, baked, or boiled. Avoid fried foods. Focus on lean protein sources like fish or chicken with vegetables and fruits. The American Heart Association is a Microbiologist!  American Heart Association Diet and Lifeystyle Recommendations   Exercise recommendations: The American Heart Association recommends 150 minutes of moderate intensity exercise weekly. Try 30 minutes of moderate intensity exercise 4-5 times per week. This could include walking, jogging, or swimming.  Depending on your conversation with pulmonology we may consider CPX testing.

## 2021-05-08 ENCOUNTER — Telehealth: Payer: Self-pay | Admitting: Pulmonary Disease

## 2021-05-08 NOTE — Telephone Encounter (Signed)
Looking at his records he had a CTA at [facility] on 4/25.  CTA here no longer needed.

## 2021-05-08 NOTE — Telephone Encounter (Signed)
I finally got the CTA approved and now there is a contrast shortage. They only have 2 weeks supply left.  The radiology tech wants to know if we could order the D-dimer before we do the CTA.  If Dr. Patsey Berthold feels like the CTA really should be done then they will schedule the CTA for the patient

## 2021-05-08 NOTE — Addendum Note (Signed)
Addended by: Claudette Head A on: 05/08/2021 01:09 PM   Modules accepted: Orders

## 2021-05-08 NOTE — Addendum Note (Signed)
Addended by: Claudette Head A on: 05/08/2021 12:03 PM   Modules accepted: Orders

## 2021-05-08 NOTE — Telephone Encounter (Signed)
Noted.  Will close encounter.  

## 2021-05-14 ENCOUNTER — Telehealth: Payer: Self-pay | Admitting: *Deleted

## 2021-05-14 ENCOUNTER — Ambulatory Visit (INDEPENDENT_AMBULATORY_CARE_PROVIDER_SITE_OTHER): Payer: Medicaid Other | Admitting: Primary Care

## 2021-05-14 ENCOUNTER — Other Ambulatory Visit: Payer: Self-pay

## 2021-05-14 ENCOUNTER — Encounter: Payer: Self-pay | Admitting: Primary Care

## 2021-05-14 DIAGNOSIS — I25118 Atherosclerotic heart disease of native coronary artery with other forms of angina pectoris: Secondary | ICD-10-CM

## 2021-05-14 DIAGNOSIS — E042 Nontoxic multinodular goiter: Secondary | ICD-10-CM

## 2021-05-14 DIAGNOSIS — R0600 Dyspnea, unspecified: Secondary | ICD-10-CM

## 2021-05-14 DIAGNOSIS — I2510 Atherosclerotic heart disease of native coronary artery without angina pectoris: Secondary | ICD-10-CM | POA: Diagnosis not present

## 2021-05-14 DIAGNOSIS — J45909 Unspecified asthma, uncomplicated: Secondary | ICD-10-CM | POA: Insufficient documentation

## 2021-05-14 DIAGNOSIS — R0609 Other forms of dyspnea: Secondary | ICD-10-CM

## 2021-05-14 DIAGNOSIS — J4530 Mild persistent asthma, uncomplicated: Secondary | ICD-10-CM | POA: Diagnosis not present

## 2021-05-14 NOTE — Telephone Encounter (Signed)
Spoke to pt, reviewed instructions for CPX to be done at [facility].  Pt verbalized understanding of instructions. Number given to schedule procedure [phone]. Orders placed. Advised pt to contact me back if he is unable to schedule appointment.  Pt appreciative and voiced understanding.

## 2021-05-14 NOTE — Patient Instructions (Signed)
Breathing test showed some evidence of mild asthma Recommend continuing Breo one puff daily for the next three months until fllow-up We will get in touch with your PCP about ordering stress test Follow up with Dr. Melrose Nakayama regarding neck   Rx: Breo 100 one puff   Follow-up 3 months with Dr. Patsey Berthold only

## 2021-05-14 NOTE — Assessment & Plan Note (Deleted)
-   Cardiology is considering cardiopulmonary stress test

## 2021-05-14 NOTE — Telephone Encounter (Signed)
Pt has been scheduled 07/01/21 for CPX.

## 2021-05-14 NOTE — Telephone Encounter (Signed)
-----   Message from Loel Dubonnet, NP sent at 05/14/2021 12:55 PM EDT ----- Regarding: RE: CPX Thanks for the update Beth!  I've CC'd my nurse to help facilitate testing.   Jinny Blossom, can we please get [name] set up for CPX due to dyspnea on exertion, CAD? He and I discussed the possibility at our last visit so he shouldn't have many questions. If he does, I can call when I'm back in town Monday!  Sunnie Nielsen ----- Message ----- From: Martyn Ehrich, NP Sent: 05/14/2021  10:19 AM EDT To: Loel Dubonnet, NP Subject: CPX                                            Vito Berger, I would get a stress test on this guy. He still is having shortness of breath and reports chest tightness with exertion. He feels his symptoms are related to an old neck injury and he will be following back up with Dr. Melrose Nakayama. He does feels Memory Dance has helped some, his exam is just not overtly consistent with asthma in my opinion. I am going to keep him on BREO 100 for another 3 months and have him come back then.   -Beth

## 2021-05-14 NOTE — Assessment & Plan Note (Addendum)
Patient reports dyspnea and chest tightness with exertion. He has a dry cough in the morning that does not last through the day. Lungs are clear on exam.  No benefit from combivent; some improvement from BREO 100. Patient feels his respiratory symtpoms are due to an old neck injury, he will be following back up with Dr. Melrose Nakayama. Recommend continuing BREO 100 for another 3 months until follow-up. D/t exertional chest tightness I think if would be beneficial to check CPX with cardiology.   Testing: - 04/15/21 FEV1 2.71 (82%), ratio 63/ Minimal obstruction, no BD response - 04/21/21 CTA negative for PE. Bilateral mild dependent atelectasis. Otherwise, no focal consolidation. No sizable pleural effusion.

## 2021-05-14 NOTE — Assessment & Plan Note (Signed)
-   04/21/21 CT neck showed mildly prominent thyroid and tiny scattered thyroid nodules - I will reach out to his primary care to see if they will be following up on these findings

## 2021-05-14 NOTE — Progress Notes (Signed)
_0  ID: [name] [name], male    DOB: December 04, 1961, 60 y.o.   MRN: [id number]  Chief Complaint  Patient presents with  . Follow-up    SOB, wheezing, sore throat.     Referring provider: Steele Sizer, MD  HPI: 60 year old male, former smoker quit in 2006.  Past medical history significant for coronary artery disease, hypertension, stenosis of carotid artery, allergic rhinitis, GERD, type 2 diabetes, hyperlipidemia, seizure, mild cognitive impairment, obesity. Patient of Dr. Merrie Roof, seen for initial consult on 03/27/2021 for dyspnea on exertion.  Previous [facility] pulmonary encounter: 03/27/21- Dr. Patsey Berthold  Patient is a 60 year old former smoker (quit 2006, less than 2 pack years) who presents for evaluation of dyspnea of 4 months duration.  Patient is kindly referred by Laurann Montana, NP.  Patient's primary care physician is Dr. Steele Sizer.  Patient states that he has had dyspnea for approximately 4 months which started quite sudden in onset.  He notes that he normally could walk 1 to 2 miles daily without any difficulty but now cannot even walk to his mailbox without feeling short of breath.  He also notices occasional dizziness with this.  He notes also some dry cough that occurs only when he is short of breath.  He is not sure about wheezing.  He does note severe gastroesophageal reflux symptoms.  He gets chest tightness when he gets short of breath.  Evaluation that included a stress test, left heart cath and echocardiogram.  Echo stress test was read as "low risk scan".  Left heart cath showed mid LAD lesion that was 30% stenosed, left ventricular systolic function that was normal LV end-diastolic pressure that was mildly elevated and left ventricular ejection fraction between 55 and 65%.  2D echo confirmed LVEF of 65% with grade 1 diastolic dysfunction no other abnormalities.  Patient also had a chest x-ray that showed no significant abnormalities.  Questionable mild air trapping.  Has  been on a Combivent inhaler provided to him by his primary care physician without any improvement.  His only relief happens when he rests.  He has diabetes mellitus and has been well controlled and is compliant with diet and exercise as well as he can given his recent issues with dyspnea..  He has obstructive sleep apnea and has been on CPAP for 7 years and this is managed by primary care.  He has not had issues with the equipment.  No fevers, chills or sweats.  No weight loss or anorexia.  He has no military history.  Does endorse a history of prior asbestos exposure in the 1990s.  05/14/2021 - INTERIM HX  Patient presents today for 6-week follow-up/review PFTs. He has a morning cough with no mucus production. Occasional wheezing and chest tightness with exertion. No improvement with combivent. Started BREO 100, he feels this has helped some. CTA in April 2021 was negative for PE. Coronary artery calcifications. Mildly prominent thyroid gland with minimal associated. He feels his breathing problems are related to an old neck injury, he will be following back up with Dr. Melrose Nakayama.   Pulmonary function testing: 04/15/21 FVC 4.30 (10%), FEV1 2.71 (82%), ratio 63 Minimal obstruction, no BD response  Allergies  Allergen Reactions  . Gabapentin Other (See Comments)    Groggy-Mood Changes  . Latex Rash  . Penicillins Hives and Rash    Has patient had a PCN reaction causing immediate rash, facial/tongue/throat swelling, SOB or lightheadedness with hypotension: Yes Has patient had a PCN reaction causing severe rash involving  mucus membranes or skin necrosis: No Has patient had a PCN reaction that required hospitalization No Has patient had a PCN reaction occurring within the last 10 years: No If all of the above answers are "NO", then may proceed with Cephalosporin use.     Immunization History  Administered Date(s) Administered  . Influenza Inj Mdck Quad Pf 09/21/2019  . Influenza, Seasonal,  Injecte, Preservative Fre 11/15/2012  . Influenza,inj,Quad PF,6+ Mos 10/10/2013, 08/24/2014, 09/05/2015, 09/25/2016, 10/21/2017, 10/05/2018, 09/26/2020  . Influenza-Unspecified 08/24/2014  . PFIZER(Purple Top)SARS-COV-2 Vaccination 03/15/2020, 04/05/2020, 10/20/2020  . Pneumococcal Conjugate-13 09/25/2016  . Pneumococcal Polysaccharide-23 06/05/2015  . Tdap 02/23/2013    Past Medical History:  Diagnosis Date  . Allergy   . Angina, class III ([place])   . Chronic constipation   . Degeneration of intervertebral disc of cervical region   . Diabetes mellitus without complication ([place])   . Diastolic dysfunction    a. 06/2017 Echo: EF 60-65%, no rwma, Gr1 DD.  Marland Kitchen Dyspnea   . GERD (gastroesophageal reflux disease)   . Hernia 1991   02/10/2012-RIH repair  . Hyperlipidemia   . Hypertension 2008  . Nerve root pain   . Neuropathy   . Non-obstructive CAD (coronary artery disease)    a. 05/30/2015 cath: LM nl, mLAD 50% (FFR 0.83), LCx minor irregs, RCA minor irregs, EF 55-65%-->Med Rx; b. 03/2016 Cath: LM nl LAD 68m D1/2/3 min irregs, LCX min irregs, OM1/2/3 nl, RCA min irregs, RPDA/RPAV/RPL1/RPL2 nl-->Med Rx; b. 07/2020 Cath: LM nl, LAD 38mD1/2/3 min irregs, LCX min irregs, RCA min irregs, RPDA/RPAV/RPL1,2 nl, EF 55-65%.  . Obesity, unspecified 2012  . Personal history of tobacco use, presenting hazards to health 2012  . Polycythemia   . Rectal bleeding 02/11/2017  . Recurrent Right Inguinal Hernia Repair 02/09/2011, 04/07/2013   [facility] Sleep apnea    a. On CPAP;  b. 06/2017 Echo: no PAH.  . Marland Kitchenmbilical hernia without mention of obstruction or gangrene 02/09/2011   [facility]. Vitamin D deficiency     Tobacco History: Social History   Tobacco Use  Smoking Status Former Smoker  . Years: 2.00  . Types: Cigars  . Start date: 12/28/2002  . Quit date: 06/04/2005  . Years since quitting: 15.9  Smokeless Tobacco Never Used  Tobacco Comment   quit 2006-smoked 1 cigar occ    Counseling given: Not Answered Comment: quit 2006-smoked 1 cigar occ   Outpatient Medications Prior to Visit  Medication Sig Dispense Refill  . ACCU-CHEK GUIDE test strip TEST BLOOD SUGAR UP TO TWICE DAILY 100 strip 1  . Accu-Chek Softclix Lancets lancets TEST TWICE DAILY 100 each 3  . aspirin 81 MG tablet Take 81 mg by mouth daily.    . Marland Kitchentorvastatin (LIPITOR) 80 MG tablet TAKE 1 TABLET(80 MG) BY MOUTH DAILY 90 tablet 1  . blood glucose meter kit and supplies 1 each by Other route in the morning and at bedtime. Dispense based on patient and insurance preference. Use up to two  times daily. (FOR ICD-10 E10.9, E11.9). 1 each 0  . carvedilol (COREG) 6.25 MG tablet Take 1 tablet (6.25 mg total) by mouth 2 (two) times daily. 180 tablet 1  . cetirizine (ZYRTEC) 10 MG tablet Take 10 mg by mouth daily as needed for allergies.   1  . dicyclomine (BENTYL) 10 MG capsule TAKE 1 CAPSULE BY MOUTH THREE TIMES A DAY BEFORE MEALS 90 capsule 0  . ezetimibe (ZETIA) 10 MG tablet  Take 1 tablet (10 mg total) by mouth daily. 90 tablet 1  . fluticasone (FLONASE) 50 MCG/ACT nasal spray Place 2 sprays into the nose daily as needed for allergies or rhinitis.     . fluticasone furoate-vilanterol (BREO ELLIPTA) 100-25 MCG/INH AEPB Inhale 1 puff into the lungs daily. 60 each 11  . Ipratropium-Albuterol (COMBIVENT) 20-100 MCG/ACT AERS respimat Inhale 1 puff into the lungs every 6 (six) hours. 4 g 0  . isosorbide mononitrate (IMDUR) 30 MG 24 hr tablet Take 15 mg by mouth daily.    Marland Kitchen ketoconazole (NIZORAL) 2 % shampoo Apply 1 application topically 3 (three) times a week. Wash scalp/body 3 times a week as needed for alres, let sit 5 minutes before rinsing off, avoid eyes. May use once monthly for maintenance. 120 mL 11  . losartan-hydrochlorothiazide (HYZAAR) 50-12.5 MG tablet Take 1 tablet by mouth daily.    Marland Kitchen lubiprostone (AMITIZA) 24 MCG capsule TAKE 1 CAPSULE(24 MCG) BY MOUTH TWICE DAILY WITH A MEAL 60 capsule 0  .  NITROSTAT 0.4 MG SL tablet place 1 tablet under the tongue every 5 minutes for UP TO 3 doses if needed for angina as directed by prescriber 25 tablet 1  . pantoprazole (PROTONIX) 40 MG tablet Take 1 tablet (40 mg total) by mouth daily. 90 tablet 3  . pregabalin (LYRICA) 50 MG capsule Take 1 capsule (50 mg total) by mouth 3 (three) times daily. 270 capsule 1  . ranolazine (RANEXA) 1000 MG SR tablet Take 1 tablet (1,000 mg total) by mouth 2 (two) times daily. 180 tablet 3  . TRULICITY 1.5 MH/6.8GS SOPN ADMINISTER 1.5 MG UNDER THE SKIN 1 TIME A WEEK 2 mL 2   No facility-administered medications prior to visit.   Review of Systems  Review of Systems  Constitutional: Negative.   HENT: Negative.   Respiratory: Positive for cough and shortness of breath.        Exertional dyspnea and chest tightness  Cardiovascular: Negative.   Musculoskeletal: Positive for neck pain.   Physical Exam  BP 130/80 (BP Location: Right Arm, Patient Position: Sitting, Cuff Size: Normal)   Pulse 70   Temp (!) 97.1 F (36.2 C) (Temporal)   Ht _0  (1.803 m)   Wt 221 lb (100.2 kg)   SpO2 100%   BMI 30.82 kg/m  Physical Exam Constitutional:      Appearance: Normal appearance.  HENT:     Head: Normocephalic and atraumatic.  Cardiovascular:     Rate and Rhythm: Normal rate and regular rhythm.  Pulmonary:     Breath sounds: No wheezing.     Comments: CTA Musculoskeletal:        General: Normal range of motion.  Skin:    General: Skin is warm and dry.  Neurological:     General: No focal deficit present.     Mental Status: He is alert and oriented to person, place, and time. Mental status is at baseline.  Psychiatric:        Mood and Affect: Mood normal.        Behavior: Behavior normal.        Thought Content: Thought content normal.        Judgment: Judgment normal.      Lab Results:  CBC    Component Value Date/Time   WBC 9.6 02/28/2021 1259   RBC 5.18 02/28/2021 1259   HGB 17.1 (H)  02/28/2021 1259   HGB 16.8 07/25/2020 1057   HCT 48.8 02/28/2021 1259  HCT 46.7 07/25/2020 1057   PLT 205 02/28/2021 1259   PLT 238 07/25/2020 1057   MCV 94.2 02/28/2021 1259   MCV 94 07/25/2020 1057   MCH 33.0 02/28/2021 1259   MCHC 35.0 02/28/2021 1259   RDW 14.2 02/28/2021 1259   RDW 13.0 07/25/2020 1057   LYMPHSABS 4.0 02/28/2021 1259   LYMPHSABS 3.3 (H) 07/25/2020 1057   MONOABS 0.8 02/28/2021 1259   EOSABS 0.1 02/28/2021 1259   EOSABS 0.1 07/25/2020 1057   BASOSABS 0.1 02/28/2021 1259   BASOSABS 0.1 07/25/2020 1057    BMET    Component Value Date/Time   NA 140 02/28/2021 1259   NA 137 07/25/2020 1057   K 4.1 02/28/2021 1259   CL 102 02/28/2021 1259   CO2 23 02/28/2021 1259   GLUCOSE 88 02/28/2021 1259   BUN 10 02/28/2021 1259   BUN 15 07/25/2020 1057   CREATININE 0.82 02/28/2021 1259   CREATININE 0.92 09/26/2020 1204   CALCIUM 9.3 02/28/2021 1259   GFRNONAA >60 02/28/2021 1259   GFRNONAA 91 09/26/2020 1204   GFRAA 106 09/26/2020 1204    BNP    Component Value Date/Time   BNP 36.3 02/28/2021 1259    ProBNP No results found for: PROBNP  Imaging: Pulmonary Function Test [facility] Only  Result Date: 04/16/2021 Spirometry Data Is Acceptable and Reproducible Mild Obstructive Airways Disease without  Significant Broncho-Dilator Response Consider outpatient Pulmonary Consultation if needed Clinical Correlation Advised     Assessment & Plan:   Asthma Patient reports dyspnea and chest tightness with exertion. He has a dry cough in the morning that does not last through the day. Lungs are clear on exam.  No benefit from combivent; some improvement from BREO 100. Patient feels his respiratory symtpoms are due to an old neck injury, he will be following back up with Dr. Melrose Nakayama. Recommend continuing BREO 100 for another 3 months until follow-up. D/t exertional chest tightness I think if would be beneficial to check CPX with cardiology.   Testing: - 04/15/21 FEV1 2.71  (82%), ratio 63/ Minimal obstruction, no BD response - 04/21/21 CTA negative for PE. Bilateral mild dependent atelectasis. Otherwise, no focal consolidation. No sizable pleural effusion.   Multiple thyroid nodules - 04/21/21 CT neck showed mildly prominent thyroid and tiny scattered thyroid nodules - I will reach out to his primary care to see if they will be following up on these findings    Martyn Ehrich, NP 05/14/2021

## 2021-05-15 NOTE — Progress Notes (Signed)
In additionRecent imaging shows that he may have some impingement of his upper airway from a prominent thyroid.  He has had significant C-spine issues.  Dyspnea may be related to all these issues.  Agree with continuing Breo for now.  Agree with the details of the visit as noted by Geraldo Pitter, NP.  Renold Don, MD [facility]

## 2021-06-05 ENCOUNTER — Other Ambulatory Visit: Payer: Self-pay | Admitting: Physician Assistant

## 2021-06-05 DIAGNOSIS — R569 Unspecified convulsions: Secondary | ICD-10-CM

## 2021-06-06 ENCOUNTER — Other Ambulatory Visit: Payer: Self-pay | Admitting: Family Medicine

## 2021-06-06 DIAGNOSIS — E1129 Type 2 diabetes mellitus with other diabetic kidney complication: Secondary | ICD-10-CM

## 2021-06-13 DIAGNOSIS — R200 Anesthesia of skin: Secondary | ICD-10-CM | POA: Diagnosis not present

## 2021-06-14 ENCOUNTER — Ambulatory Visit
Admission: RE | Admit: 2021-06-14 | Discharge: 2021-06-14 | Disposition: A | Discharge status: Home / Self Care | Payer: Medicaid Other | Source: Ambulatory Visit | Attending: Physician Assistant | Admitting: Physician Assistant

## 2021-06-14 ENCOUNTER — Other Ambulatory Visit: Payer: Self-pay

## 2021-06-14 ENCOUNTER — Other Ambulatory Visit: Payer: Self-pay | Admitting: Family Medicine

## 2021-06-14 DIAGNOSIS — G9389 Other specified disorders of brain: Secondary | ICD-10-CM | POA: Diagnosis not present

## 2021-06-14 DIAGNOSIS — R251 Tremor, unspecified: Secondary | ICD-10-CM | POA: Diagnosis not present

## 2021-06-14 DIAGNOSIS — G319 Degenerative disease of nervous system, unspecified: Secondary | ICD-10-CM | POA: Diagnosis not present

## 2021-06-14 DIAGNOSIS — R569 Unspecified convulsions: Secondary | ICD-10-CM

## 2021-06-14 DIAGNOSIS — R42 Dizziness and giddiness: Secondary | ICD-10-CM | POA: Diagnosis not present

## 2021-06-14 MED ORDER — GADOBUTROL 1 MMOL/ML IV SOLN
10.0000 mL | Freq: Once | INTRAVENOUS | Status: AC | PRN
  Administered 2021-06-14: 10 mL via INTRAVENOUS

## 2021-06-14 NOTE — Telephone Encounter (Signed)
Requested Prescriptions  Pending Prescriptions Disp Refills  . ACCU-CHEK AVIVA PLUS test strip [Pharmacy Med Name: [place] 100S] 100 strip 1    Sig: USE AS DIRECTED TO TEST BLOOD SUGAR THREE TIMES DAILY AND AS NEEDED     Endocrinology: Diabetes - Testing Supplies Passed - 06/14/2021 10:36 AM      Passed - Valid encounter within last 12 months    Recent Outpatient Visits          2 months ago Dyslipidemia associated with type 2 diabetes mellitus [facility])   [facility] [place], Drue Stager, MD   3 months ago SOB (shortness of breath)   [facility] [place], Drue Stager, MD   5 months ago Controlled type 2 diabetes mellitus with microalbuminuria, without long-term current use of insulin [facility])   [facility] [place], Drue Stager, MD   8 months ago Controlled type 2 diabetes mellitus with microalbuminuria, without long-term current use of insulin [facility])   [facility] [place], Drue Stager, MD   10 months ago Obesity (BMI 30.0-34.9)   [facility] Steele Sizer, MD      Future Appointments            In 1 month Ancil Boozer, Drue Stager, MD [facility], [facility]   In 2 months Furth, Cadence H, PA-C [place], [facility]

## 2021-06-18 ENCOUNTER — Other Ambulatory Visit: Payer: Self-pay | Admitting: Family Medicine

## 2021-06-20 DIAGNOSIS — E538 Deficiency of other specified B group vitamins: Secondary | ICD-10-CM | POA: Diagnosis not present

## 2021-06-20 DIAGNOSIS — R531 Weakness: Secondary | ICD-10-CM | POA: Diagnosis not present

## 2021-06-20 DIAGNOSIS — R55 Syncope and collapse: Secondary | ICD-10-CM | POA: Diagnosis not present

## 2021-06-20 DIAGNOSIS — R413 Other amnesia: Secondary | ICD-10-CM | POA: Diagnosis not present

## 2021-06-20 DIAGNOSIS — M542 Cervicalgia: Secondary | ICD-10-CM | POA: Diagnosis not present

## 2021-06-20 DIAGNOSIS — R200 Anesthesia of skin: Secondary | ICD-10-CM | POA: Diagnosis not present

## 2021-06-20 DIAGNOSIS — R42 Dizziness and giddiness: Secondary | ICD-10-CM | POA: Diagnosis not present

## 2021-06-20 DIAGNOSIS — R0602 Shortness of breath: Secondary | ICD-10-CM | POA: Diagnosis not present

## 2021-06-20 DIAGNOSIS — R519 Headache, unspecified: Secondary | ICD-10-CM | POA: Diagnosis not present

## 2021-06-26 ENCOUNTER — Ambulatory Visit: Payer: Medicaid Other | Admitting: Family Medicine

## 2021-06-26 DIAGNOSIS — R569 Unspecified convulsions: Secondary | ICD-10-CM | POA: Diagnosis not present

## 2021-07-01 ENCOUNTER — Ambulatory Visit (HOSPITAL_COMMUNITY): Payer: Medicaid Other | Attending: Internal Medicine

## 2021-07-01 ENCOUNTER — Other Ambulatory Visit: Payer: Self-pay

## 2021-07-01 DIAGNOSIS — R0600 Dyspnea, unspecified: Secondary | ICD-10-CM | POA: Insufficient documentation

## 2021-07-01 DIAGNOSIS — I25118 Atherosclerotic heart disease of native coronary artery with other forms of angina pectoris: Secondary | ICD-10-CM | POA: Diagnosis not present

## 2021-07-01 DIAGNOSIS — R0609 Other forms of dyspnea: Secondary | ICD-10-CM

## 2021-07-03 ENCOUNTER — Encounter: Payer: Self-pay | Admitting: Family Medicine

## 2021-07-04 ENCOUNTER — Encounter (HOSPITAL_BASED_OUTPATIENT_CLINIC_OR_DEPARTMENT_OTHER): Payer: Self-pay | Admitting: Family

## 2021-07-10 ENCOUNTER — Encounter: Payer: Self-pay | Admitting: *Deleted

## 2021-07-10 ENCOUNTER — Telehealth (HOSPITAL_BASED_OUTPATIENT_CLINIC_OR_DEPARTMENT_OTHER): Payer: Self-pay | Admitting: Family

## 2021-07-10 ENCOUNTER — Other Ambulatory Visit (HOSPITAL_BASED_OUTPATIENT_CLINIC_OR_DEPARTMENT_OTHER): Payer: Self-pay | Admitting: Family

## 2021-07-10 DIAGNOSIS — Z01812 Encounter for preprocedural laboratory examination: Secondary | ICD-10-CM

## 2021-07-10 DIAGNOSIS — E538 Deficiency of other specified B group vitamins: Secondary | ICD-10-CM | POA: Diagnosis not present

## 2021-07-10 DIAGNOSIS — R0600 Dyspnea, unspecified: Secondary | ICD-10-CM

## 2021-07-10 DIAGNOSIS — I25118 Atherosclerotic heart disease of native coronary artery with other forms of angina pectoris: Secondary | ICD-10-CM

## 2021-07-10 DIAGNOSIS — R0609 Other forms of dyspnea: Secondary | ICD-10-CM

## 2021-07-10 NOTE — Telephone Encounter (Signed)
Clarified with Penni Homans, RN for Dr. Fletcher Anon, that he would like to perform the patient's heart cath on 07/30/21 at [facility].  I have scheduled him for Wednesday 07/30/21 at 12:00 pm. I have called and notified the patient. I have confirmed with him that this date/ time will work. I have also confirmed no contrast dye allergies for the patient.  He is aware I will forward a detailed copy of his pre-procedure cath instructions to his MyChart.  I have asked that he review these in detail and reach out to [place] with any questions or concerns.   He is also aware that he will require pre-procedure lab work. I have asked him to come to the [facility] between (7/20-8/1) to have this done.  The patient voices understanding of all of the above recommendations and is agreeable.   He was appreciative of the call back.

## 2021-07-10 NOTE — Addendum Note (Signed)
Addended by: Alvis Lemmings C on: 07/10/2021 11:56 AM   Modules accepted: Orders

## 2021-07-10 NOTE — Telephone Encounter (Signed)
Called patient to discuss CPX testing and recommendation for [facility].   Will route to nursing team to schedule R/LHC.   Will cancel 07/18/21 appt with Ignacia Bayley, NP and rescheduled for 2 week follow up after cardiac catheterization.   Shared Decision Making/Informed Consent The risks [stroke (1 in 1000), death (1 in 1000), kidney failure [usually temporary] (1 in 500), bleeding (1 in 200), allergic reaction [possibly serious] (1 in 200)], benefits (diagnostic support and management of coronary artery disease) and alternatives of a cardiac catheterization were discussed in detail with [name] [name] and he is willing to proceed.  Loel Dubonnet, NP

## 2021-07-10 NOTE — Telephone Encounter (Signed)
Patient has been scheduled for [facility] with Dr. Fletcher Anon 07/30/21.

## 2021-07-15 ENCOUNTER — Telehealth: Payer: Self-pay | Admitting: Cardiovascular Disease

## 2021-07-15 ENCOUNTER — Telehealth: Payer: Self-pay | Admitting: *Deleted

## 2021-07-15 NOTE — Telephone Encounter (Addendum)
Message fwd to scheduling to call the patient to schedule an appt prior to his 07/30/21 cardiac cath.

## 2021-07-15 NOTE — Telephone Encounter (Signed)
Per [facility]-procedure time change to 2 PM/arrival time 12 Noon.  Patient notified he will need to arrive [facility] 12 noon for 2 PM [facility].

## 2021-07-15 NOTE — Telephone Encounter (Signed)
-----   Message from Katrine Coho, RN sent at 07/15/2021 11:39 AM EDT ----- Regarding: R/LHC 07/30/21/need H&P [place],  [facility] 07/30/21 with Dr Addison Bailey office visit with cardiology was 05/01/21 Per [facility]/Cath Lab -he will need H&P within 30 days of cath.  Let me know if you have questions or I can help.  Thanks! Webb Silversmith

## 2021-07-16 NOTE — Telephone Encounter (Signed)
Left message to see if patient can come see DR. Fletcher Anon tomorrow at 10 am.

## 2021-07-17 ENCOUNTER — Ambulatory Visit: Payer: Medicaid Other | Admitting: Cardiovascular Disease

## 2021-07-17 NOTE — Telephone Encounter (Signed)
Patient is scheduled to see Ignacia Bayley, NP on 07/24/21.

## 2021-07-18 ENCOUNTER — Ambulatory Visit: Payer: Medicaid Other | Admitting: Nurse Practitioner

## 2021-07-18 NOTE — Progress Notes (Signed)
Name: [name] [name]   MRN: [id number]    DOB: 03/15/1961   Date:07/21/2021       Progress Note  Subjective  Chief Complaint  Follow Up  HPI  DMII: A1C has been well controlled now 5.4 %  He is off Lantus, Trajenta, Metformin , we also stopped [place] in March, he is currently on Trulicity 1.5 mg weekly and continues to lose weight.  A1C is down to 5.43%  FSBS  100's fasting   No polyphagia, polydipsia or polyuria. He has associated obesity, dyslipidemia and HTN . He is not very physically active due to SOB and increase in heart rate during activity. We will decrease dose of Trulicity to 4.31 mg weekly and recheck next visit, may need to stop medications for Diabetes.    Constipation: doing well on Amitiza, down to once daily pill  , Bristol scale of 4 , no longer straining, has increased fiber in his diet, he has bowel movements now at least once a day but no more than 3 times per day.   He is under the care of Dr. Marius Ditch    HTN/Angina : taking medication, bp has been under control at home highest is 116-130'/ 63-85.  He is on ARB and Ranexa, off Imdur due to hypotension, having difficulty walking due to SOB and associated dizziness, he went for stress test but could not walk for over 4 minutes, he is going for cardiac cath next week.   SOB:  going on for months ( started Fall 2021)  he has been evaluated by pulmonologist and cardiologist and no answers yet, having cardiac cath next week. Negative CT chest   Hyperlipidemia: compliant with medication, reviewed last labs , LDL at goal it was 68 ,we will recheck it today    Seizure like activity  : he is now seeing Dr. Melrose Nakayama, still has tremors when standing up, but no seziure like activity lately, he has episodes of dizziness. He has a history of neck surgery and is on Lyrica . Recently diagnosed with B12 deficiency and started injections last week.    OSA: he is wearing CPAP every night since he got a new mask   Sciatica: used to see Dr.  Phyllis Ginger for neck problems, now having right lower back pain, that radiates to posterior thigh. He is not ready to go back for steroid injections because it caused his glucose to spike    Obesity/Malnutrition: BMI is now down to 30, he has lost over 30 lbs in the past year, he states he has not been as physically active but eating healthy, he is worried about cancer. We will check some labs.    Patient Active Problem List   Diagnosis Date Noted   Asthma 05/14/2021   Multiple thyroid nodules 05/14/2021   Localized, primary osteoarthritis of hand 09/28/2019   Seizure ([place]) 02/22/2018   Mild cognitive impairment 11/17/2017   Radiculopathy of cervical region 11/17/2017   Stenosis of carotid artery 09/08/2017   Cervical stenosis of spine 09/08/2017   B12 deficiency 06/28/2017   Headache disorder 02/15/2017   Memory loss or impairment 02/15/2017   Coronary artery disease involving native coronary artery of native heart    Radiculitis of left cervical region 03/05/2016   Diabetic neuropathy associated with type 2 diabetes mellitus ([place]) 12/05/2015   Patellar subluxation 06/05/2015   Allergic rhinitis, seasonal 06/05/2015   Chronic constipation 06/05/2015   Chronic lower back pain 06/05/2015   Morbid obesity due to excess calories ([place])  06/05/2015   Vitamin D deficiency 06/05/2015   Central sleep apnea 06/05/2015   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   Nerve root pain 06/05/2015   Acquired polycythemia 06/05/2015   Anterior knee pain 45/80/9983   Dysmetabolic syndrome 38/25/0539   Failure of erection 06/05/2015   Gastro-esophageal reflux disease without esophagitis 06/05/2015   Neuropathy 06/05/2015   CAD (coronary artery disease)    Angina, class III ([place]) 05/23/2015   Essential hypertension 05/23/2015   Hyperlipidemia 05/23/2015   Inguinal hernia without mention of obstruction or gangrene, recurrent unilateral or unspecified 03/24/2013    Past Surgical History:  Procedure Laterality Date    BACK SURGERY  12/2008   X2-LUMBAR   CARDIAC CATHETERIZATION N/A 05/30/2015   Procedure: Left Heart Cath;  Surgeon: Wellington Hampshire, MD;  Location: [place];  Service: Cardiovascular;  Laterality: N/A;   CARDIAC CATHETERIZATION N/A 04/16/2016   Procedure: Left Heart Cath and Coronary Angiography;  Surgeon: Wellington Hampshire, MD;  Location: [place] [facility];  Service: Cardiovascular;  Laterality: N/A;   COLONOSCOPY  2014   Dr. [name]   COLONOSCOPY WITH PROPOFOL N/A 12/25/2016   Procedure: COLONOSCOPY WITH PROPOFOL;  Surgeon: Jonathon Bellows, MD;  Location: [facility];  Service: Endoscopy;  Laterality: N/A;   COLONOSCOPY WITH PROPOFOL N/A 01/29/2017   Procedure: COLONOSCOPY WITH PROPOFOL;  Surgeon: Jonathon Bellows, MD;  Location: [facility] END[facility]COPY;  Service: Endoscopy;  Laterality: N/A;   COLONOSCOPY WITH PROPOFOL N/A 04/11/2020   Procedure: COLONOSCOPY WITH PROPOFOL;  Surgeon: Jonathon Bellows, MD;  Location: [facility];  Service: Gastroenterology;  Laterality: N/A;   ESOPHAGOGASTRODUODENOSCOPY (EGD) WITH PROPOFOL N/A 01/26/2019   Procedure: ESOPHAGOGASTRODUODENOSCOPY (EGD) WITH PROPOFOL;  Surgeon: Lin Landsman, MD;  Location: [facility] ENDOSCOPY;  Service: Gastroenterology;  Laterality: N/A;   EVALUATION UNDER ANESTHESIA WITH HEMORRHOIDECTOMY N/A 02/24/2017   Procedure: EXAM UNDER ANESTHESIA WITH POSSIBLE EXCISION OF INTERNAL HEMORRHOIDS;  Surgeon: Olean Ree, MD;  Location: [facility];  Service: General;  Laterality: N/A;   FISSURECTOMY  02/24/2017   Procedure: FISSURECTOMY;  Surgeon: Olean Ree, MD;  Location: [facility];  Service: General;;   HAND SURGERY Left 2020   HERNIA REPAIR Right 1991   INGUINAL HERNIA REPAIR Right 2012   Dr Teryl Lucy HERNIA REPAIR Right 2014   Dr [name]   LEFT HEART CATH AND CORONARY ANGIOGRAPHY Left 08/05/2020   Procedure: LEFT HEART CATH [place] PCI;  Surgeon: Wellington Hampshire, MD;  Location: [place] [facility];  Service:  Cardiovascular;  Laterality: Left;    Family History  Problem Relation Age of Onset   Heart Problems Mother    Heart Problems Father 61       myocardial infarction   Mental illness Brother    Testicular cancer Paternal Uncle    Other Maternal Aunt        COVID   Breast cancer Cousin     Social History   Tobacco Use   Smoking status: Former    Types: Cigars    Start date: 12/28/2002    Quit date: 06/04/2005    Years since quitting: 16.1   Smokeless tobacco: Never   Tobacco comments:    quit 2006-smoked 1 cigar occ  Substance Use Topics   Alcohol use: Yes    Alcohol/week: 1.0 standard drink    Types: 1 Glasses of wine per week    Comment: R[employer]     Current Outpatient Medications:    ACCU-CHEK AVIVA PLUS test strip, USE  AS DIRECTED TO TEST BLOOD SUGAR THREE TIMES DAILY AND AS NEEDED, Disp: 100 strip, Rfl: 1   Accu-Chek Softclix Lancets lancets, TEST TWICE DAILY, Disp: 100 each, Rfl: 3   aspirin 81 MG tablet, Take 81 mg by mouth daily., Disp: , Rfl:    atorvastatin (LIPITOR) 80 MG tablet, TAKE 1 TABLET(80 MG) BY MOUTH DAILY, Disp: 90 tablet, Rfl: 1   blood glucose meter kit and supplies, 1 each by Other route in the morning and at bedtime. Dispense based on patient and insurance preference. Use up to two  times daily. (FOR ICD-10 E10.9, E11.9)., Disp: 1 each, Rfl: 0   carvedilol (COREG) 6.25 MG tablet, Take 1 tablet (6.25 mg total) by mouth 2 (two) times daily., Disp: 180 tablet, Rfl: 1   cetirizine (ZYRTEC) 10 MG tablet, Take 10 mg by mouth daily as needed for allergies. , Disp: , Rfl: 1   dicyclomine (BENTYL) 10 MG capsule, TAKE 1 CAPSULE BY MOUTH THREE TIMES A DAY BEFORE MEALS, Disp: 90 capsule, Rfl: 0   ezetimibe (ZETIA) 10 MG tablet, Take 1 tablet (10 mg total) by mouth daily., Disp: 90 tablet, Rfl: 1   fluticasone (FLONASE) 50 MCG/ACT nasal spray, Place 2 sprays into the nose daily as needed for allergies or rhinitis. , Disp: , Rfl:    Ipratropium-Albuterol (COMBIVENT)  20-100 MCG/ACT AERS respimat, Inhale 1 puff into the lungs every 6 (six) hours., Disp: 4 g, Rfl: 0   isosorbide mononitrate (IMDUR) 30 MG 24 hr tablet, Take 15 mg by mouth daily., Disp: , Rfl:    ketoconazole (NIZORAL) 2 % shampoo, Apply 1 application topically 3 (three) times a week. Wash scalp/body 3 times a week as needed for alres, let sit 5 minutes before rinsing off, avoid eyes. May use once monthly for maintenance., Disp: 120 mL, Rfl: 11   losartan-hydrochlorothiazide (HYZAAR) 50-12.5 MG tablet, Take 1 tablet by mouth daily., Disp: , Rfl:    lubiprostone (AMITIZA) 24 MCG capsule, TAKE 1 CAPSULE(24 MCG) BY MOUTH TWICE DAILY WITH A MEAL, Disp: 60 capsule, Rfl: 0   NITROSTAT 0.4 MG SL tablet, place 1 tablet under the tongue every 5 minutes for UP TO 3 doses if needed for angina as directed by prescriber, Disp: 25 tablet, Rfl: 1   pantoprazole (PROTONIX) 40 MG tablet, Take 1 tablet (40 mg total) by mouth daily., Disp: 90 tablet, Rfl: 3   pregabalin (LYRICA) 50 MG capsule, Take 1 capsule (50 mg total) by mouth 3 (three) times daily., Disp: 270 capsule, Rfl: 1   ranolazine (RANEXA) 1000 MG SR tablet, Take 1 tablet (1,000 mg total) by mouth 2 (two) times daily., Disp: 180 tablet, Rfl: 3   TRULICITY 1.5 JO/8.4ZY SOPN, ADMINISTER 1.5 MG UNDER THE SKIN 1 TIME A WEEK, Disp: 2 mL, Rfl: 2  Allergies  Allergen Reactions   Gabapentin Other (See Comments)    Groggy-Mood Changes   Latex Rash   Penicillins Hives and Rash    Has patient had a PCN reaction causing immediate rash, facial/tongue/throat swelling, SOB or lightheadedness with hypotension: Yes Has patient had a PCN reaction causing severe rash involving mucus membranes or skin necrosis: No Has patient had a PCN reaction that required hospitalization No Has patient had a PCN reaction occurring within the last 10 years: No If all of the above answers are "NO", then may proceed with Cephalosporin use.     I personally reviewed active problem  list, medication list, allergies, family history, social history, health maintenance with the  patient/caregiver today.   ROS  Constitutional: Negative for fever, positive for  weight change.  Respiratory: Negative for cough , positive for  shortness of breath.   Cardiovascular: Negative for chest pain or palpitations.  Gastrointestinal: Negative for abdominal pain, no bowel changes.  Musculoskeletal: Negative for gait problem or joint swelling.  Skin: Negative for rash.  Neurological: positive  for dizziness but no headache.  No other specific complaints in a complete review of systems (except as listed in HPI above).   Objective  Vitals:   07/21/21 0804  BP: 130/78  Pulse: 90  Resp: 16  Temp: 98.1 F (36.7 C)  TempSrc: Oral  SpO2: 95%  Weight: 218 lb (98.9 kg)  Height: '5\' 11"'  (1.803 m)    Body mass index is 30.4 kg/m.  Physical Exam  Constitutional: Patient appears well-developed but looks thin. Obese  No distress.  HEENT: head atraumatic, normocephalic, pupils equal and reactive to light, neck supple Cardiovascular: Normal rate, regular rhythm and normal heart sounds.  No murmur heard. No BLE edema. Pulmonary/Chest: Effort normal and breath sounds normal. No respiratory distress. Abdominal: Soft.  There is no tenderness. Psychiatric: Patient has a normal mood and affect. behavior is normal. Judgment and thought content normal.   Recent Results (from the past 2160 hour(s))  POCT HgB A1C     Status: None   Collection Time: 07/21/21  8:13 AM  Result Value Ref Range   Hemoglobin A1C 5.3 4.0 - 5.6 %   HbA1c POC (<> result, manual entry)     HbA1c, POC (prediabetic range)     HbA1c, POC (controlled diabetic range)       PHQ2/9: Depression screen [facility] 2/9 07/21/2021 03/26/2021 02/28/2021 12/19/2020 09/26/2020  Decreased Interest 0 0 0 0 0  Down, Depressed, Hopeless 0 0 0 0 0  PHQ - 2 Score 0 0 0 0 0  Altered sleeping - - - 0 -  Tired, decreased energy - - - 3 -  Change  in appetite - - - 0 -  Feeling bad or failure about yourself  - - - 0 -  Trouble concentrating - - - 0 -  Moving slowly or fidgety/restless - - - 0 -  Suicidal thoughts - - - 0 -  PHQ-9 Score - - - 3 -  Difficult doing work/chores - - - - -  Some recent data might be hidden    phq 9 is negative   Fall Risk: Fall Risk  07/21/2021 03/26/2021 02/28/2021 12/19/2020 09/26/2020  Falls in the past year? 0 0 0 0 1  Number falls in past yr: 0 0 0 0 0  Injury with Fall? 0 0 - 0 0  Comment - - - - -  Risk for fall due to : - - - - History of fall(s)  Follow up - - Falls prevention discussed - -      Functional Status Survey: Is the patient deaf or have difficulty hearing?: No Does the patient have difficulty seeing, even when wearing glasses/contacts?: No Does the patient have difficulty concentrating, remembering, or making decisions?: No Does the patient have difficulty walking or climbing stairs?: No Does the patient have difficulty dressing or bathing?: No Does the patient have difficulty doing errands alone such as visiting a doctor's office or shopping?: No    Assessment & Plan  1. Controlled type 2 diabetes mellitus with microalbuminuria, without long-term current use of insulin (HCC)  - POCT HgB A1C - Microalbumin / creatinine urine  ratio  2. Dyslipidemia associated with type 2 diabetes mellitus (HCC)  - Lipid panel - Microalbumin / creatinine urine ratio - ezetimibe (ZETIA) 10 MG tablet; Take 1 tablet (10 mg total) by mouth daily.  Dispense: 90 tablet; Refill: 1  3. Essential hypertension  - carvedilol (COREG) 6.25 MG tablet; Take 1 tablet (6.25 mg total) by mouth 2 (two) times daily.  Dispense: 180 tablet; Refill: 1  4. Coronary artery disease of native artery of native heart with stable angina pectoris ([place])   5. Hypertension associated with diabetes ([place])   6. Obesity (BMI 30.0-34.9)   7. Moderate protein-calorie malnutrition (HCC)  - C-reactive protein -  COMPLETE METABOLIC PANEL WITH GFR - CBC with Differential/Platelet  8. Prostate cancer screening  - PSA  9. Encounter for screening for HIV  - HIV Antibody (routine testing w rflx)  10. Weight loss, abnormal  - C-reactive protein - POC Hemoccult Bld/Stl (3-Cd Home Screen); Future  11. Sciatica of right side  - pregabalin (LYRICA) 50 MG capsule; Take 1 capsule (50 mg total) by mouth 3 (three) times daily.  Dispense: 270 capsule; Refill: 1  12. Cervical radiculitis  - pregabalin (LYRICA) 50 MG capsule; Take 1 capsule (50 mg total) by mouth 3 (three) times daily.  Dispense: 270 capsule; Refill: 1  13. Mixed hyperlipidemia  - atorvastatin (LIPITOR) 80 MG tablet; TAKE 1 TABLET(80 MG) BY MOUTH DAILY  Dispense: 90 tablet; Refill: 1  14. Need for shingles vaccine  - Varicella-zoster vaccine IM

## 2021-07-18 NOTE — H&P (Signed)
History and Physical   Patient ID: [name] [name], MRN: [id number], DOB: Feb 22, 1961   Date of Encounter: 07/18/2021, 9:56 PM  Primary Care Provider: Steele Sizer, MD Cardiologist: [name], MD Electrophysiologist:  None  Chief Complaint:  Dyspnea  History of Present Illness: [name] [name] is a 60 y.o. 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   Cardiac catheterization 05/2015 with moderate mod LAD stenosis (50%) with FFR 0.83. Normal LVEF with normal LVEDP. Due to worsening anginal 03/2016 underwent repeat cardiac cath with improvement in mid-LAD stenosis to 30% and no evidence of obstructive disease.   He had recurrent episodes of loss of consciousness in 8032 of uncertain etiology. Echo at that time with normal LVEF. 2 week ZIO monitor with no significant arrhytmia. Evaluated by neurology as well with no clear etiology. He did have recurrent concussions while growing up playing sports.   He had recurrent angina 07/2017 with cath one vessel CAD of 30% stenosis to mis LAD. EF normal with mildly elevated LVEDP. He was treated medically with low dose Imdur.    He was seen in clinic January 2022 noting worsening exertional dyspnea and intermittent lightheadedness.  Subsequent stress test was low risk with no evidence of significant ischemia and CT attenuation images showing no coronary calcification in LAD and RCA.  He was recommended for echocardiogram.  Echo 02/14/2021 LVEF 60 to 65%, no wall motion, mild LVH, grade 1 diastolic dysfunction, no significant valvular abnormalities.  He was subsequently referred to pulmonology.  PFTs 04/15/2021 with mild obstructive airway disease without significant bronchodilator response.  He was unimproved on Combivent and was transitioned to [employer].   ED visit to [facility] health for 04/21/2021 with CTA chest showing no  pulmonary embolism, coronary calcifications, and mildly prominent thyroid gland with minimal associated effacement of trachea.  He was recommended for outpatient thyroid ultrasound.  He has had persistent dyspnea despite different inhalers. He has tried to increase physical activity but has been unable to do so due to dyspnea. CPX testing was performed 07/01/21 with severely depressed peak VO2 and HR response despite peak RER suggesting of maximal effort. Resting PFTs were normal. Given persistent dyspnea and unclear CPX report, cardiac catheterization was recommended.   The risks [stroke (1 in 1000), death (1 in 19), kidney failure [usually temporary] (1 in 500), bleeding (1 in 200), allergic reaction [possibly serious] (1 in 200)], benefits (diagnostic support and management of coronary artery disease) and alternatives of a cardiac catheterization were discussed in detail with [name] [name] and he is willing to proceed.   Past Medical History:  Diagnosis Date   Allergy    Angina, class III (HCC)    Chronic constipation    Degeneration of intervertebral disc of cervical region    Diabetes mellitus without complication (HCC)    Diastolic dysfunction    a. 06/2017 Echo: EF 60-65%, no rwma, Gr1 DD.   Dyspnea    GERD (gastroesophageal reflux disease)    Hernia 1991   02/10/2012-RIH repair   Hyperlipidemia    Hypertension 2008   Nerve root pain    Neuropathy    Non-obstructive CAD (coronary artery disease)    a. 05/30/2015 cath: LM nl, mLAD 50% (FFR 0.83), LCx minor irregs, RCA minor irregs, EF 55-65%-->Med Rx; b. 03/2016 Cath: LM nl LAD 24m D1/2/3 min irregs, LCX min irregs, OM1/2/3 nl, RCA min irregs, RPDA/RPAV/RPL1/RPL2 nl-->Med Rx; b. 07/2020  Cath: LM nl, LAD 70m D1/2/3 min irregs, LCX min irregs, RCA min irregs, RPDA/RPAV/RPL1,2 nl, EF 55-65%.   Obesity, unspecified 2012   Personal history of tobacco use, presenting hazards to health 2012   Polycythemia    Rectal bleeding 02/11/2017   Recurrent  Right Inguinal Hernia Repair 02/09/2011, [facility]07[facility]  Sleep apnea    a. On CPAP;  b. 06/2017 Echo: no PAH.   Umbilical hernia without mention of obstruction or gangrene 02/09/2011   [facility]  Vitamin D deficiency     Past Surgical History:  Procedure Laterality Date   [facility]  CARDIAC CATHETERIZATION N/A 05/30/2015   Procedure: Left Heart Cath;  Surgeon: MWellington Hampshire MD;  [place];  Service: Cardiovascular;  Laterality: N/A;   CARDIAC CATHETERIZATION N/A 04/16/2016   Procedure: Left Heart Cath and Coronary Angiography;  Surgeon: MWellington Hampshire MD;  [place] [facility];  Service: Cardiovascular;  Laterality: N/A;   COLONOSCOPY  2014   Dr. SJamal Collin  COLONOSCOPY WITH PROPOFOL N/A 12/25/2016   Procedure: COLONOSCOPY WITH PROPOFOL;  Surgeon: KJonathon Bellows MD;  Location: [facility] ENDOSCOPY;  Service: Endoscopy;  Laterality: N/A;   COLONOSCOPY WITH PROPOFOL N/A 01/29/2017   Procedure: COLONOSCOPY WITH PROPOFOL;  Surgeon: KJonathon Bellows MD;  Location: [facility] ENDOSCOPY;  Service: Endoscopy;  Laterality: N/A;   COLONOSCOPY WITH PROPOFOL N/A 04/11/2020   Procedure: COLONOSCOPY WITH PROPOFOL;  Surgeon: AJonathon Bellows MD;  [facility];  Service: Gastroenterology;  Laterality: N/A;   ESOPHAGOGASTRODUODENOSCOPY (EGD) WITH PROPOFOL N/A 01/26/2019   Procedure: ESOPHAGOGASTRODUODENOSCOPY (EGD) WITH PROPOFOL;  Surgeon: VLin Landsman MD;  [facility];  Service: Gastroenterology;  Laterality: N/A;   EVALUATION UNDER ANESTHESIA WITH HEMORRHOIDECTOMY N/A 02/24/2017   Procedure: EXAM UNDER ANESTHESIA WITH POSSIBLE EXCISION OF INTERNAL HEMORRHOIDS;  Surgeon: JOlean Ree MD;  Location: [facility] ORS;  Service: General;  Laterality: N/A;   FISSURECTOMY  02/24/2017   Procedure: FISSURECTOMY;  Surgeon: JOlean Ree MD;  Location: [facility] ORS;  Service: General;;   HAND SURGERY Left 2020   HERNIA REPAIR Right 1991    INGUINAL HERNIA REPAIR Right 2012   Dr STeryl LucyHERNIA REPAIR Right 2014   Dr SJamal Collin  LEFT HEART CATH AND CORONARY ANGIOGRAPHY Left 08/05/2020   Procedure: LEFT HEART CATH [place];  Surgeon: AWellington Hampshire MD;  [place];  Service: Cardiovascular;  Laterality: Left;     Prior to Admission medications   Medication Sig Start Date End Date Taking? Authorizing Provider  ACCU-CHEK AVIVA PLUS test strip USE AS DIRECTED TO TEST BLOOD SUGAR THREE TIMES DAILY AND AS NEEDED 06/14/21   SSteele Sizer MD  Accu-Chek Softclix Lancets lancets TEST TWICE DAILY 10/11/20   SSteele Sizer MD  aspirin 81 MG tablet Take 81 mg by mouth daily.    [provider]  atorvastatin (LIPITOR) 80 MG tablet TAKE 1 TABLET(80 MG) BY MOUTH DAILY 03/17/21   SSteele Sizer MD  blood glucose meter kit and supplies 1 each by Other route in the morning and at bedtime. Dispense based on patient and insurance preference. Use up to two  times daily. (FOR ICD-10 E10.9, E11.9). 10/09/20   SSteele Sizer MD  carvedilol (COREG) 6.25 MG tablet Take 1 tablet (6.25 mg total) by mouth 2 (two) times daily. 12/19/20   SSteele Sizer MD  cetirizine (ZYRTEC) 10 MG tablet  Take 10 mg by mouth daily as needed for allergies.  07/09/16   [provider]  dicyclomine (BENTYL) 10 MG capsule TAKE 1 CAPSULE BY MOUTH THREE TIMES A DAY BEFORE MEALS 04/27/18   Jonathon Bellows, MD  ezetimibe (ZETIA) 10 MG tablet Take 1 tablet (10 mg total) by mouth daily. 03/26/21   Steele Sizer, MD  fluticasone (FLONASE) 50 MCG/ACT nasal spray Place 2 sprays into the nose daily as needed for allergies or rhinitis.  03/05/15   [provider]  Ipratropium-Albuterol (COMBIVENT) 20-100 MCG/ACT AERS respimat Inhale 1 puff into the lungs every 6 (six) hours. 02/28/21   Steele Sizer, MD  isosorbide mononitrate (IMDUR) 30 MG 24 hr tablet Take 15 mg by mouth daily. 02/15/21   [provider]   ketoconazole (NIZORAL) 2 % shampoo Apply 1 application topically 3 (three) times a week. Wash scalp/body 3 times a week as needed for alres, let sit 5 minutes before rinsing off, avoid eyes. May use once monthly for maintenance. 04/28/21   Ralene Bathe, MD  losartan-hydrochlorothiazide (HYZAAR) 50-12.5 MG tablet Take 1 tablet by mouth daily. 03/23/21   [provider]  lubiprostone (AMITIZA) 24 MCG capsule TAKE 1 CAPSULE(24 MCG) BY MOUTH TWICE DAILY WITH A MEAL 05/06/20   Sowles, Drue Stager, MD  NITROSTAT 0.4 MG SL tablet place 1 tablet under the tongue every 5 minutes for UP TO 3 doses if needed for angina as directed by prescriber 01/15/17   Wellington Hampshire, MD  pantoprazole (PROTONIX) 40 MG tablet Take 1 tablet (40 mg total) by mouth daily. 05/01/21   Loel Dubonnet, NP  pregabalin (LYRICA) 50 MG capsule Take 1 capsule (50 mg total) by mouth 3 (three) times daily. 03/26/21   Steele Sizer, MD  ranolazine (RANEXA) 1000 MG SR tablet Take 1 tablet (1,000 mg total) by mouth 2 (two) times daily. 12/23/20   Wellington Hampshire, MD  TRULICITY 1.5 LY/6.5KP SOPN ADMINISTER 1.5 MG UNDER THE SKIN 1 TIME A WEEK 06/06/21   Steele Sizer, MD     Allergies: Allergies  Allergen Reactions   Gabapentin Other (See Comments)    Groggy-Mood Changes   Latex Rash   Penicillins Hives and Rash    Has patient had a PCN reaction causing immediate rash, facial/tongue/throat swelling, SOB or lightheadedness with hypotension: Yes Has patient had a PCN reaction causing severe rash involving mucus membranes or skin necrosis: No Has patient had a PCN reaction that required hospitalization No Has patient had a PCN reaction occurring within the last 10 years: No If all of the above answers are "NO", then may proceed with Cephalosporin use.     Social History:  The patient  reports that he quit smoking about 16 years ago. His smoking use included cigars. He started smoking about 18 years ago. He has never used  smokeless tobacco. He reports current alcohol use of about 1.0 standard drink of alcohol per week. He reports that he does not use drugs.   Family History:  The patient's family history includes Breast cancer in his cousin; Heart Problems in his mother; Heart Problems (age of onset: 66) in his father; Mental illness in his brother; Other in his maternal aunt; Testicular cancer in his paternal uncle.   ROS:  Please see the history of present illness.    All other systems reviewed and negative.   Vital Signs: There were no vitals taken for this visit.  PHYSICAL EXAM: General:  Well nourished, overweight, well developed, in  no acute distress HEENT: normal Lymph: no adenopathy Neck: no JVD Endocrine:  No thryomegaly Vascular: No carotid bruits; FA pulses 2+ bilaterally without bruits  Cardiac:  normal S1, S2; RRR; no murmur  Lungs:  clear to auscultation bilaterally, no wheezing, rhonchi or rales  Abd: soft, nontender, no hepatomegaly  Ext: no edema Musculoskeletal:  No deformities, BUE and BLE strength normal and equal Skin: warm and dry  Neuro:  CNs 2-12 intact, no focal abnormalities noted Psych:  Normal affect   EKG:  EKG performed 05/01/21 shows NSR 76 bpm with no acute ST/T wave changes.   Labs:   Lab Results  Component Value Date   WBC 9.6 02/28/2021   HGB 17.1 (H) 02/28/2021   HCT 48.8 02/28/2021   MCV 94.2 02/28/2021   PLT 205 02/28/2021   No results for input(s): NA, K, CL, CO2, BUN, CREATININE, CALCIUM, PROT, BILITOT, ALKPHOS, ALT, AST, GLUCOSE in the last 168 hours.  Invalid input(s): LABALBU No results for input(s): CKTOTAL, CKMB, TROPONINI in the last 72 hours. Troponin (Point of Care Test) No results for input(s): TROPIPOC in the last 72 hours.  Lab Results  Component Value Date   CHOL 134 09/26/2020   HDL 46 09/26/2020   LDLCALC 68 09/26/2020   TRIG 121 09/26/2020   No results found for: DDIMER  Radiology/Studies:  No results found.    ASSESSMENT AND  PLAN:   DOE / Mild to moderate CAD with 30% stenosis of LAD via LHC 07/2020 - LHC 07/2020 with 30% mid LAD stenosis. Worsening dyspnea led to [facility] 12/2020 which was low risk and echo 01/2021 with normal LVEF gr1DD, no significant valvular abnormalities. He was referred to pulmonology with unremarkable PFT and persistent dyspnea despite inhalers. He had CPX with severely reduced peak VO2 despite maximal effort. Plan to proceed with right and left cardiac catheterization for further evaluation of his dyspnea. The risks [stroke (1 in 1000), death (1 in 86), kidney failure [usually temporary] (1 in 500), bleeding (1 in 200), allergic reaction [possibly serious] (1 in 200)], benefits (diagnostic support and management of coronary artery disease) and alternatives of a cardiac catheterization were discussed in detail with Mr. Aja and he is willing to proceed.   HLD, LDL goal <70 - 09/26/20 LDL 68. Continue Atorvastatin 80m QD, Zetia 128mQD.  HTN - BP well controlled. Continue current antihypertensive regimen including Losartan-HCTZ 50-12.34m80mD, Ranexa 100m16mD, Coreg 6.234mg34m, Imdur 134mg 47m  DM2 - Continue to follow with PCP.   GERD - Continue Protonix 40mg Q61mNear syncope / Lightheadedness - previous ZIO 2019 no significant arrhythmia. Lightheadedness most often with significant shortness of breath. Workup of dyspnea, as above.   OSA - Reports compliance with CPAP.   Signed,  CaitlinLoel Dubonnet/22/2022 9:56 PM

## 2021-07-21 ENCOUNTER — Ambulatory Visit: Payer: Medicaid Other | Admitting: Family Medicine

## 2021-07-21 ENCOUNTER — Other Ambulatory Visit: Payer: Self-pay

## 2021-07-21 ENCOUNTER — Encounter: Payer: Self-pay | Admitting: Family Medicine

## 2021-07-21 VITALS — BP 130/78 | HR 90 | Temp 98.1°F | Resp 16 | Ht 71.0 in | Wt 218.0 lb

## 2021-07-21 DIAGNOSIS — E440 Moderate protein-calorie malnutrition: Secondary | ICD-10-CM | POA: Diagnosis not present

## 2021-07-21 DIAGNOSIS — I10 Essential (primary) hypertension: Secondary | ICD-10-CM

## 2021-07-21 DIAGNOSIS — R634 Abnormal weight loss: Secondary | ICD-10-CM | POA: Diagnosis not present

## 2021-07-21 DIAGNOSIS — Z125 Encounter for screening for malignant neoplasm of prostate: Secondary | ICD-10-CM | POA: Diagnosis not present

## 2021-07-21 DIAGNOSIS — M5412 Radiculopathy, cervical region: Secondary | ICD-10-CM | POA: Diagnosis not present

## 2021-07-21 DIAGNOSIS — E782 Mixed hyperlipidemia: Secondary | ICD-10-CM

## 2021-07-21 DIAGNOSIS — E1159 Type 2 diabetes mellitus with other circulatory complications: Secondary | ICD-10-CM

## 2021-07-21 DIAGNOSIS — I25118 Atherosclerotic heart disease of native coronary artery with other forms of angina pectoris: Secondary | ICD-10-CM

## 2021-07-21 DIAGNOSIS — E1129 Type 2 diabetes mellitus with other diabetic kidney complication: Secondary | ICD-10-CM | POA: Diagnosis not present

## 2021-07-21 DIAGNOSIS — Z114 Encounter for screening for human immunodeficiency virus [HIV]: Secondary | ICD-10-CM | POA: Diagnosis not present

## 2021-07-21 DIAGNOSIS — E669 Obesity, unspecified: Secondary | ICD-10-CM

## 2021-07-21 DIAGNOSIS — Z23 Encounter for immunization: Secondary | ICD-10-CM | POA: Diagnosis not present

## 2021-07-21 DIAGNOSIS — R809 Proteinuria, unspecified: Secondary | ICD-10-CM | POA: Diagnosis not present

## 2021-07-21 DIAGNOSIS — E785 Hyperlipidemia, unspecified: Secondary | ICD-10-CM

## 2021-07-21 DIAGNOSIS — M5431 Sciatica, right side: Secondary | ICD-10-CM

## 2021-07-21 DIAGNOSIS — I152 Hypertension secondary to endocrine disorders: Secondary | ICD-10-CM

## 2021-07-21 DIAGNOSIS — E1169 Type 2 diabetes mellitus with other specified complication: Secondary | ICD-10-CM | POA: Diagnosis not present

## 2021-07-21 LAB — POCT GLYCOSYLATED HEMOGLOBIN (HGB A1C): Hemoglobin A1C: 5.3 % (ref 4.0–5.6)

## 2021-07-21 MED ORDER — EZETIMIBE 10 MG PO TABS: 10.0000 mg | ORAL_TABLET | Freq: Every day | ORAL | 1 refills | Status: DC

## 2021-07-21 MED ORDER — CARVEDILOL 6.25 MG PO TABS: 6.2500 mg | ORAL_TABLET | Freq: Two times a day (BID) | ORAL | 1 refills | Status: DC

## 2021-07-21 MED ORDER — PREGABALIN 50 MG PO CAPS: 50.0000 mg | ORAL_CAPSULE | Freq: Three times a day (TID) | ORAL | 1 refills | Status: DC

## 2021-07-21 MED ORDER — ATORVASTATIN CALCIUM 80 MG PO TABS: ORAL_TABLET | ORAL | 1 refills | Status: DC

## 2021-07-21 MED ORDER — TRULICITY 0.75 MG/0.5ML ~~LOC~~ SOAJ: 0.7500 mg | SUBCUTANEOUS | 1 refills | Status: DC

## 2021-07-22 ENCOUNTER — Telehealth: Payer: Self-pay | Admitting: Cardiovascular Disease

## 2021-07-22 ENCOUNTER — Encounter: Payer: Self-pay | Admitting: Family Medicine

## 2021-07-22 ENCOUNTER — Encounter: Payer: Self-pay | Admitting: *Deleted

## 2021-07-22 ENCOUNTER — Telehealth: Payer: Self-pay

## 2021-07-22 DIAGNOSIS — E1169 Type 2 diabetes mellitus with other specified complication: Secondary | ICD-10-CM | POA: Diagnosis not present

## 2021-07-22 DIAGNOSIS — R809 Proteinuria, unspecified: Secondary | ICD-10-CM | POA: Diagnosis not present

## 2021-07-22 DIAGNOSIS — E440 Moderate protein-calorie malnutrition: Secondary | ICD-10-CM | POA: Diagnosis not present

## 2021-07-22 DIAGNOSIS — E785 Hyperlipidemia, unspecified: Secondary | ICD-10-CM | POA: Diagnosis not present

## 2021-07-22 DIAGNOSIS — E1129 Type 2 diabetes mellitus with other diabetic kidney complication: Secondary | ICD-10-CM | POA: Diagnosis not present

## 2021-07-22 DIAGNOSIS — R634 Abnormal weight loss: Secondary | ICD-10-CM | POA: Diagnosis not present

## 2021-07-22 DIAGNOSIS — Z114 Encounter for screening for human immunodeficiency virus [HIV]: Secondary | ICD-10-CM | POA: Diagnosis not present

## 2021-07-22 DIAGNOSIS — Z125 Encounter for screening for malignant neoplasm of prostate: Secondary | ICD-10-CM | POA: Diagnosis not present

## 2021-07-22 LAB — CBC WITH DIFFERENTIAL/PLATELET
Absolute Monocytes: 672 cells/uL (ref 200–950)
Basophils Absolute: 48 cells/uL (ref 0–200)
Basophils Relative: 0.6 %
Eosinophils Absolute: 40 cells/uL (ref 15–500)
Eosinophils Relative: 0.5 %
HCT: 53.4 % — ABNORMAL HIGH (ref 38.5–50.0)
Hemoglobin: 17.7 g/dL — ABNORMAL HIGH (ref 13.2–17.1)
Lymphs Abs: 2880 cells/uL (ref 850–3900)
MCH: 31.6 pg (ref 27.0–33.0)
MCHC: 33.1 g/dL (ref 32.0–36.0)
MCV: 95.2 fL (ref 80.0–100.0)
MPV: 9.4 fL (ref 7.5–12.5)
Monocytes Relative: 8.4 %
Neutro Abs: 4360 cells/uL (ref 1500–7800)
Neutrophils Relative %: 54.5 %
Platelets: 226 10*3/uL (ref 140–400)
RBC: 5.61 10*6/uL (ref 4.20–5.80)
RDW: 12.8 % (ref 11.0–15.0)
Total Lymphocyte: 36 %
WBC: 8 10*3/uL (ref 3.8–10.8)

## 2021-07-22 LAB — COMPLETE METABOLIC PANEL WITH GFR
AG Ratio: 1.5 (calc) (ref 1.0–2.5)
ALT: 30 U/L (ref 9–46)
AST: 37 U/L — ABNORMAL HIGH (ref 10–35)
Albumin: 4.5 g/dL (ref 3.6–5.1)
Alkaline phosphatase (APISO): 67 U/L (ref 35–144)
BUN: 12 mg/dL (ref 7–25)
CO2: 28 mmol/L (ref 20–32)
Calcium: 9.8 mg/dL (ref 8.6–10.3)
Chloride: 101 mmol/L (ref 98–110)
Creat: 0.85 mg/dL (ref 0.70–1.30)
Globulin: 3 g/dL (calc) (ref 1.9–3.7)
Glucose, Bld: 90 mg/dL (ref 65–99)
Potassium: 4.4 mmol/L (ref 3.5–5.3)
Sodium: 140 mmol/L (ref 135–146)
Total Bilirubin: 0.7 mg/dL (ref 0.2–1.2)
Total Protein: 7.5 g/dL (ref 6.1–8.1)
eGFR: 101 mL/min/{1.73_m2} (ref >=60)

## 2021-07-22 LAB — POC HEMOCCULT BLD/STL (HOME/3-CARD/SCREEN)
Card #2 Fecal Occult Blod, POC: NEGATIVE
Card #3 Fecal Occult Blood, POC: NEGATIVE
Fecal Occult Blood, POC: NEGATIVE

## 2021-07-22 LAB — LIPID PANEL
Cholesterol: 129 mg/dL (ref <200)
HDL: 56 mg/dL (ref >=40)
LDL Cholesterol (Calc): 57 mg/dL (calc)
Non-HDL Cholesterol (Calc): 73 mg/dL (calc) (ref <130)
Total CHOL/HDL Ratio: 2.3 (calc) (ref <5.0)
Triglycerides: 79 mg/dL (ref <150)

## 2021-07-22 LAB — PSA: PSA: 1.76 ng/mL (ref <=4.00)

## 2021-07-22 LAB — HIV ANTIBODY (ROUTINE TESTING W REFLEX): HIV 1&2 Ab, 4th Generation: NONREACTIVE

## 2021-07-22 LAB — C-REACTIVE PROTEIN: CRP: 0.6 mg/L (ref <8.0)

## 2021-07-22 NOTE — Telephone Encounter (Signed)
Patient had labs at primary care provider office and he wanted to make sure if all labs covered everything. I reviewed chart and he is scheduled to come in on Thursday. Advised that it appears all labs were done but when he comes in for appointment we can determine if any additional labs are needed. He verbalized understanding and was agreeable at this time.

## 2021-07-22 NOTE — Telephone Encounter (Signed)
Patient calling  States he had a lot of blood work completed at [facility]  Would like to know if he has to complete anymore

## 2021-07-22 NOTE — Addendum Note (Signed)
Addended by: Carlene Coria on: 07/22/2021 09:55 AM   Modules accepted: Orders

## 2021-07-22 NOTE — Telephone Encounter (Signed)
Patient dropped off stool card in lab .

## 2021-07-23 ENCOUNTER — Encounter: Payer: Self-pay | Admitting: Family Medicine

## 2021-07-23 ENCOUNTER — Telehealth: Payer: Self-pay | Admitting: *Deleted

## 2021-07-23 LAB — MICROALBUMIN / CREATININE URINE RATIO
Creatinine, Urine: 57 mg/dL (ref 20–320)
Microalb Creat Ratio: 32 mcg/mg creat — ABNORMAL HIGH (ref <30)
Microalb, Ur: 1.8 mg/dL

## 2021-07-23 NOTE — Telephone Encounter (Signed)
Patient is aware appointment with Ignacia Bayley, PA 07/24/21 has been cancelled, Dr Fletcher Anon will do H&P at hospital prior to cath 07/30/21. I have reviewed 07/30/21 cath instructions with patient over the phone today and he also has instructions on My Chart.

## 2021-07-24 ENCOUNTER — Ambulatory Visit: Payer: Medicaid Other | Admitting: Nurse Practitioner

## 2021-07-28 ENCOUNTER — Telehealth: Payer: Self-pay | Admitting: *Deleted

## 2021-07-28 NOTE — Telephone Encounter (Signed)
Cardiac catheterization scheduled at [facility] for: Wednesday July 30, 2021 2 PM [facility] Main Entrance A [facility]) at: 12 Noon   No solid food after midnight prior to cath, clear liquids until 5 AM day of procedure.  Hold: Losartan-HCTZ-AM of procedure  Except hold medications  AM meds can be  taken pre-cath with sips of water including:   aspirin 81 mg   Confirmed patient has responsible adult to drive home post procedure and be with patient first 24 hours after arriving home.  Patients are allowed one visitor in the waiting room during the time they are at the hospital for their procedure. Both patient and visitor must wear a mask once they enter the hospital.   Patient reports does not currently have any symptoms concerning for COVID-19 and no household members with COVID-19 like illness.       Reviewed procedure/mask/visitor instructions with patient.

## 2021-07-30 ENCOUNTER — Ambulatory Visit (HOSPITAL_COMMUNITY)
Admission: RE | Admit: 2021-07-30 | Discharge: 2021-07-30 | Disposition: A | Discharge status: Home / Self Care | Payer: Medicaid Other | Attending: Cardiovascular Disease | Admitting: Cardiovascular Disease

## 2021-07-30 ENCOUNTER — Encounter (HOSPITAL_COMMUNITY)
Admission: RE | Disposition: A | Discharge status: Home / Self Care | Payer: Self-pay | Source: Home / Self Care | Attending: Cardiovascular Disease

## 2021-07-30 ENCOUNTER — Encounter (HOSPITAL_COMMUNITY): Payer: Self-pay | Admitting: Cardiovascular Disease

## 2021-07-30 ENCOUNTER — Other Ambulatory Visit: Payer: Self-pay

## 2021-07-30 DIAGNOSIS — E1140 Type 2 diabetes mellitus with diabetic neuropathy, unspecified: Secondary | ICD-10-CM | POA: Insufficient documentation

## 2021-07-30 DIAGNOSIS — E669 Obesity, unspecified: Secondary | ICD-10-CM | POA: Insufficient documentation

## 2021-07-30 DIAGNOSIS — Z7982 Long term (current) use of aspirin: Secondary | ICD-10-CM | POA: Diagnosis not present

## 2021-07-30 DIAGNOSIS — I2584 Coronary atherosclerosis due to calcified coronary lesion: Secondary | ICD-10-CM | POA: Diagnosis not present

## 2021-07-30 DIAGNOSIS — K219 Gastro-esophageal reflux disease without esophagitis: Secondary | ICD-10-CM | POA: Diagnosis not present

## 2021-07-30 DIAGNOSIS — I25118 Atherosclerotic heart disease of native coronary artery with other forms of angina pectoris: Secondary | ICD-10-CM | POA: Diagnosis not present

## 2021-07-30 DIAGNOSIS — R079 Chest pain, unspecified: Secondary | ICD-10-CM

## 2021-07-30 DIAGNOSIS — I10 Essential (primary) hypertension: Secondary | ICD-10-CM | POA: Insufficient documentation

## 2021-07-30 DIAGNOSIS — G4733 Obstructive sleep apnea (adult) (pediatric): Secondary | ICD-10-CM | POA: Diagnosis not present

## 2021-07-30 DIAGNOSIS — Z91040 Latex allergy status: Secondary | ICD-10-CM | POA: Insufficient documentation

## 2021-07-30 DIAGNOSIS — K5903 Drug induced constipation: Secondary | ICD-10-CM

## 2021-07-30 DIAGNOSIS — Z87891 Personal history of nicotine dependence: Secondary | ICD-10-CM | POA: Insufficient documentation

## 2021-07-30 DIAGNOSIS — Z880 Allergy status to penicillin: Secondary | ICD-10-CM | POA: Insufficient documentation

## 2021-07-30 DIAGNOSIS — E785 Hyperlipidemia, unspecified: Secondary | ICD-10-CM | POA: Insufficient documentation

## 2021-07-30 DIAGNOSIS — R42 Dizziness and giddiness: Secondary | ICD-10-CM | POA: Diagnosis not present

## 2021-07-30 DIAGNOSIS — Z79899 Other long term (current) drug therapy: Secondary | ICD-10-CM | POA: Diagnosis not present

## 2021-07-30 DIAGNOSIS — Z888 Allergy status to other drugs, medicaments and biological substances status: Secondary | ICD-10-CM | POA: Diagnosis not present

## 2021-07-30 DIAGNOSIS — R0602 Shortness of breath: Secondary | ICD-10-CM

## 2021-07-30 HISTORY — PX: INTRAVASCULAR PRESSURE WIRE/FFR STUDY: CATH118243

## 2021-07-30 HISTORY — PX: RIGHT/LEFT HEART CATH AND CORONARY ANGIOGRAPHY: CATH118266

## 2021-07-30 HISTORY — PX: CORONARY PRESSURE/FFR STUDY: CATH118243

## 2021-07-30 LAB — POCT I-STAT 7, (LYTES, BLD GAS, ICA,H+H)
Acid-Base Excess: 0 mmol/L (ref 0.0–2.0)
Bicarbonate: 24 mmol/L (ref 20.0–28.0)
Calcium, Ion: 1.22 mmol/L (ref 1.15–1.40)
HCT: 48 % (ref 39.0–52.0)
Hemoglobin: 16.3 g/dL (ref 13.0–17.0)
O2 Saturation: 98 %
Potassium: 3.9 mmol/L (ref 3.5–5.1)
Sodium: 139 mmol/L (ref 135–145)
TCO2: 25 mmol/L (ref 22–32)
pCO2 arterial: 37.4 mmHg (ref 32.0–48.0)
pH, Arterial: 7.414 (ref 7.350–7.450)
pO2, Arterial: 98 mmHg (ref 83.0–108.0)

## 2021-07-30 LAB — GLUCOSE, CAPILLARY: Glucose-Capillary: 85 mg/dL (ref 70–99)

## 2021-07-30 LAB — POCT ACTIVATED CLOTTING TIME: Activated Clotting Time: 225 seconds

## 2021-07-30 SURGERY — RIGHT/LEFT HEART CATH AND CORONARY ANGIOGRAPHY
Anesthesia: LOCAL

## 2021-07-30 MED ORDER — TRULICITY 0.75 MG/0.5ML ~~LOC~~ SOAJ: 0.7500 mg | SUBCUTANEOUS | Status: DC

## 2021-07-30 MED ORDER — IOHEXOL 350 MG/ML SOLN
INTRAVENOUS | Status: DC | PRN
  Administered 2021-07-30: 90 mL

## 2021-07-30 MED ORDER — MIDAZOLAM HCL 2 MG/2ML IJ SOLN
INTRAMUSCULAR | Status: AC
  Filled 2021-07-30: qty 2

## 2021-07-30 MED ORDER — NITROGLYCERIN 1 MG/10 ML FOR IR/CATH LAB
INTRA_ARTERIAL | Status: AC
  Filled 2021-07-30: qty 10

## 2021-07-30 MED ORDER — SODIUM CHLORIDE 0.9 % IV SOLN
INTRAVENOUS | Status: AC | PRN
  Administered 2021-07-30: 999 mL via INTRAVENOUS

## 2021-07-30 MED ORDER — HEPARIN (PORCINE) IN NACL 1000-0.9 UT/500ML-% IV SOLN
INTRAVENOUS | Status: AC
  Filled 2021-07-30: qty 1000

## 2021-07-30 MED ORDER — SODIUM CHLORIDE 0.9 % WEIGHT BASED INFUSION
3.0000 mL/kg/h | INTRAVENOUS | Status: AC
  Administered 2021-07-30: 3 mL/kg/h via INTRAVENOUS

## 2021-07-30 MED ORDER — SODIUM CHLORIDE 0.9 % IV SOLN
250.0000 mL | INTRAVENOUS | Status: DC | PRN
Start: 2021-07-30 — End: 2021-07-30

## 2021-07-30 MED ORDER — MIDAZOLAM HCL 2 MG/2ML IJ SOLN
INTRAMUSCULAR | Status: DC | PRN
  Administered 2021-07-30: 1 mg via INTRAVENOUS

## 2021-07-30 MED ORDER — ACETAMINOPHEN 325 MG PO TABS: 650.0000 mg | ORAL_TABLET | ORAL | Status: DC | PRN

## 2021-07-30 MED ORDER — ASPIRIN 81 MG PO CHEW: 81.0000 mg | CHEWABLE_TABLET | ORAL | Status: DC

## 2021-07-30 MED ORDER — VERAPAMIL HCL 2.5 MG/ML IV SOLN
INTRAVENOUS | Status: AC
  Filled 2021-07-30: qty 2

## 2021-07-30 MED ORDER — ONDANSETRON HCL 4 MG/2ML IJ SOLN: 4.0000 mg | Freq: Four times a day (QID) | INTRAMUSCULAR | Status: DC | PRN

## 2021-07-30 MED ORDER — SODIUM CHLORIDE 0.9% FLUSH: 3.0000 mL | Freq: Two times a day (BID) | INTRAVENOUS | Status: DC

## 2021-07-30 MED ORDER — FENTANYL CITRATE (PF) 100 MCG/2ML IJ SOLN
INTRAMUSCULAR | Status: DC | PRN
  Administered 2021-07-30: 50 ug via INTRAVENOUS

## 2021-07-30 MED ORDER — DICYCLOMINE HCL 10 MG PO CAPS: 10.0000 mg | ORAL_CAPSULE | Freq: Every day | ORAL | Status: DC | PRN

## 2021-07-30 MED ORDER — FENTANYL CITRATE (PF) 100 MCG/2ML IJ SOLN
INTRAMUSCULAR | Status: AC
  Filled 2021-07-30: qty 2

## 2021-07-30 MED ORDER — SODIUM CHLORIDE 0.9 % WEIGHT BASED INFUSION: 1.0000 mL/kg/h | INTRAVENOUS | Status: DC

## 2021-07-30 MED ORDER — VERAPAMIL HCL 2.5 MG/ML IV SOLN
INTRAVENOUS | Status: DC | PRN
  Administered 2021-07-30: 10 mL via INTRA_ARTERIAL

## 2021-07-30 MED ORDER — HEPARIN SODIUM (PORCINE) 1000 UNIT/ML IJ SOLN
INTRAMUSCULAR | Status: DC | PRN
  Administered 2021-07-30: 2000 [IU] via INTRAVENOUS
  Administered 2021-07-30: 5000 [IU] via INTRAVENOUS
  Administered 2021-07-30: 3000 [IU] via INTRAVENOUS

## 2021-07-30 MED ORDER — NITROGLYCERIN 1 MG/10 ML FOR IR/CATH LAB
INTRA_ARTERIAL | Status: DC | PRN
  Administered 2021-07-30: 100 ug via INTRACORONARY

## 2021-07-30 MED ORDER — LIDOCAINE HCL (PF) 1 % IJ SOLN
INTRAMUSCULAR | Status: DC | PRN
  Administered 2021-07-30 (×2): 2 mL

## 2021-07-30 MED ORDER — ADENOSINE (DIAGNOSTIC) 140MCG/KG/MIN
INTRAVENOUS | Status: DC | PRN
  Administered 2021-07-30: 140 ug/kg/min via INTRAVENOUS

## 2021-07-30 MED ORDER — HEPARIN SODIUM (PORCINE) 1000 UNIT/ML IJ SOLN
INTRAMUSCULAR | Status: AC
  Filled 2021-07-30: qty 1

## 2021-07-30 MED ORDER — SODIUM CHLORIDE 0.9 % IV SOLN: INTRAVENOUS | Status: DC

## 2021-07-30 MED ORDER — HEPARIN (PORCINE) IN NACL 1000-0.9 UT/500ML-% IV SOLN
INTRAVENOUS | Status: DC | PRN
  Administered 2021-07-30 (×2): 500 mL

## 2021-07-30 MED ORDER — NITROGLYCERIN 0.4 MG SL SUBL: 0.4000 mg | SUBLINGUAL_TABLET | SUBLINGUAL | Status: DC | PRN

## 2021-07-30 MED ORDER — LIDOCAINE HCL (PF) 1 % IJ SOLN
INTRAMUSCULAR | Status: AC
  Filled 2021-07-30: qty 30

## 2021-07-30 MED ORDER — IPRATROPIUM-ALBUTEROL 20-100 MCG/ACT IN AERS: 1.0000 | INHALATION_SPRAY | Freq: Two times a day (BID) | RESPIRATORY_TRACT | Status: DC

## 2021-07-30 MED ORDER — ADENOSINE 12 MG/4ML IV SOLN
INTRAVENOUS | Status: AC
  Filled 2021-07-30: qty 16

## 2021-07-30 MED ORDER — LUBIPROSTONE 24 MCG PO CAPS: 24.0000 ug | ORAL_CAPSULE | Freq: Two times a day (BID) | ORAL | Status: DC

## 2021-07-30 MED ORDER — SODIUM CHLORIDE 0.9% FLUSH
3.0000 mL | INTRAVENOUS | Status: DC | PRN
Start: 2021-07-30 — End: 2021-07-30

## 2021-07-30 MED ORDER — SODIUM CHLORIDE 0.9% FLUSH: 3.0000 mL | INTRAVENOUS | Status: DC | PRN

## 2021-07-30 MED ORDER — SODIUM CHLORIDE 0.9 % IV SOLN: 250.0000 mL | INTRAVENOUS | Status: DC | PRN

## 2021-07-30 SURGICAL SUPPLY — 13 items
CATH BALLN WEDGE 5F 110CM (CATHETERS) ×2 IMPLANT
CATH LAUNCHER 5F EBU3.5 (CATHETERS) ×2 IMPLANT
CATH OPTITORQUE TIG 4.0 5F (CATHETERS) ×2 IMPLANT
DEVICE RAD COMP TR BAND LRG (VASCULAR PRODUCTS) ×2 IMPLANT
GLIDESHEATH SLEND SS 6F .021 (SHEATH) ×2 IMPLANT
GUIDEWIRE INQWIRE 1.5J.035X260 (WIRE) ×1 IMPLANT
GUIDEWIRE PRESSURE X 175 (WIRE) ×2 IMPLANT
INQWIRE 1.5J .035X260CM (WIRE) ×2
KIT HEART LEFT (KITS) ×2 IMPLANT
PACK CARDIAC CATHETERIZATION (CUSTOM PROCEDURE TRAY) ×2 IMPLANT
SHEATH GLIDE SLENDER 4/5FR (SHEATH) ×2 IMPLANT
TRANSDUCER W/STOPCOCK (MISCELLANEOUS) ×2 IMPLANT
TUBING CIL FLEX 10 FLL-RA (TUBING) ×2 IMPLANT

## 2021-07-30 NOTE — Interval H&P Note (Signed)
History and Physical Interval Note:  07/30/2021 3:33 PM  [name] [name]  has presented today for surgery, with the diagnosis of coronary artery disease.  The various methods of treatment have been discussed with the patient and family. After consideration of risks, benefits and other options for treatment, the patient has consented to  Procedure(s): RIGHT/LEFT HEART CATH AND CORONARY ANGIOGRAPHY (N/A) as a surgical intervention.  The patient's history has been reviewed, patient examined, no change in status, stable for surgery.  I have reviewed the patient's chart and labs.  Questions were answered to the patient's satisfaction.     [name]

## 2021-07-30 NOTE — Discharge Instructions (Signed)
Radial Site Care  This sheet gives you information about how to care for yourself after your procedure. Your health care provider may also give you more specific instructions. If you have problems or questions, contact your health care provider. What can I expect after the procedure? After the procedure, it is common to have: Bruising and tenderness at the catheter insertion area. Follow these instructions at home: Medicines Take over-the-counter and prescription medicines only as told by your health care provider. Insertion site care Follow instructions from your health care provider about how to take care of your insertion site. Make sure you: Wash your hands with soap and water before you remove your bandage (dressing). If soap and water are not available, use hand sanitizer. May remove dressing in 24 hours. Check your insertion site every day for signs of infection. Check for: Redness, swelling, or pain. Fluid or blood. Pus or a bad smell. Warmth. Do no take baths, swim, or use a hot tub for 5 days. You may shower 24-48 hours after the procedure. Remove the dressing and gently wash the site with plain soap and water. Pat the area dry with a clean towel. Do not rub the site. That could cause bleeding. Do not apply powder or lotion to the site. Activity  For 24 hours after the procedure, or as directed by your health care provider: Do not flex or bend the affected arm. Do not push or pull heavy objects with the affected arm. Do not drive yourself home from the hospital or clinic. You may drive 24 hours after the procedure. Do not operate machinery or power tools. KEEP ARM ELEVATED THE REMAINDER OF THE DAY. Do not push, pull or lift anything that is heavier than 10 lb for 5 days. Ask your health care provider when it is okay to: Return to work or school. Resume usual physical activities or sports. Resume sexual activity. General instructions If the catheter site starts to  bleed, raise your arm and put firm pressure on the site. If the bleeding does not stop, get help right away. This is a medical emergency. DRINK PLENTY OF FLUIDS FOR THE NEXT 2-3 DAYS. No alcohol consumption for 24 hours after receiving sedation. If you went home on the same day as your procedure, a responsible adult should be with you for the first 24 hours after you arrive home. Keep all follow-up visits as told by your health care provider. This is important. Contact a health care provider if: You have a fever. You have redness, swelling, or yellow drainage around your insertion site. Get help right away if: You have unusual pain at the radial site. The catheter insertion area swells very fast. The insertion area is bleeding, and the bleeding does not stop when you hold steady pressure on the area. Your arm or hand becomes pale, cool, tingly, or numb. These symptoms may represent a serious problem that is an emergency. Do not wait to see if the symptoms will go away. Get medical help right away. Call your local emergency services (911 in the [place] Do not drive yourself to the hospital. Summary After the procedure, it is common to have bruising and tenderness at the site. Follow instructions from your health care provider about how to take care of your radial site wound. Check the wound every day for signs of infection.  This information is not intended to replace advice given to you by your health care provider. Make sure you discuss any questions you have with   your health care provider. Document Revised: 01/19/2018 Document Reviewed: 01/19/2018 Elsevier Patient Education  2020 [employer]vier Inc.  

## 2021-07-31 ENCOUNTER — Other Ambulatory Visit: Payer: Self-pay | Admitting: Family Medicine

## 2021-07-31 DIAGNOSIS — M5412 Radiculopathy, cervical region: Secondary | ICD-10-CM

## 2021-07-31 LAB — POCT I-STAT EG7
Acid-base deficit: 1 mmol/L (ref 0.0–2.0)
Bicarbonate: 23.2 mmol/L (ref 20.0–28.0)
Calcium, Ion: 1.21 mmol/L (ref 1.15–1.40)
HCT: 48 % (ref 39.0–52.0)
Hemoglobin: 16.3 g/dL (ref 13.0–17.0)
O2 Saturation: 79 %
Potassium: 3.8 mmol/L (ref 3.5–5.1)
Sodium: 139 mmol/L (ref 135–145)
TCO2: 24 mmol/L (ref 22–32)
pCO2, Ven: 38 mmHg — ABNORMAL LOW (ref 44.0–60.0)
pH, Ven: 7.393 (ref 7.250–7.430)
pO2, Ven: 43 mmHg (ref 32.0–45.0)

## 2021-08-01 ENCOUNTER — Ambulatory Visit: Payer: Medicaid Other | Admitting: Medical

## 2021-08-05 ENCOUNTER — Other Ambulatory Visit: Payer: Self-pay | Admitting: Family Medicine

## 2021-08-05 DIAGNOSIS — M5136 Other intervertebral disc degeneration, lumbar region: Secondary | ICD-10-CM | POA: Diagnosis not present

## 2021-08-05 DIAGNOSIS — M5412 Radiculopathy, cervical region: Secondary | ICD-10-CM

## 2021-08-05 DIAGNOSIS — M5030 Other cervical disc degeneration, unspecified cervical region: Secondary | ICD-10-CM | POA: Diagnosis not present

## 2021-08-07 DIAGNOSIS — G4731 Primary central sleep apnea: Secondary | ICD-10-CM | POA: Diagnosis not present

## 2021-08-07 DIAGNOSIS — G4733 Obstructive sleep apnea (adult) (pediatric): Secondary | ICD-10-CM | POA: Diagnosis not present

## 2021-08-11 ENCOUNTER — Encounter: Payer: Self-pay | Admitting: Family Medicine

## 2021-08-13 ENCOUNTER — Ambulatory Visit: Payer: Medicaid Other | Admitting: Medical

## 2021-08-13 ENCOUNTER — Ambulatory Visit: Payer: Medicaid Other

## 2021-08-13 ENCOUNTER — Other Ambulatory Visit: Payer: Self-pay

## 2021-08-13 ENCOUNTER — Ambulatory Visit (INDEPENDENT_AMBULATORY_CARE_PROVIDER_SITE_OTHER): Payer: Medicaid Other

## 2021-08-13 DIAGNOSIS — E538 Deficiency of other specified B group vitamins: Secondary | ICD-10-CM | POA: Diagnosis not present

## 2021-08-13 MED ORDER — CYANOCOBALAMIN 1000 MCG/ML IJ SOLN
1000.0000 ug | Freq: Once | INTRAMUSCULAR | Status: AC
  Administered 2021-08-13: 1000 ug via INTRAMUSCULAR

## 2021-08-14 ENCOUNTER — Other Ambulatory Visit: Payer: Self-pay | Admitting: Nurse Practitioner

## 2021-08-14 ENCOUNTER — Other Ambulatory Visit: Payer: Self-pay | Admitting: Family Medicine

## 2021-08-15 ENCOUNTER — Encounter: Payer: Self-pay | Admitting: Dermatology

## 2021-08-21 ENCOUNTER — Encounter: Payer: Self-pay | Admitting: Dermatology

## 2021-08-21 ENCOUNTER — Other Ambulatory Visit: Payer: Self-pay

## 2021-08-21 ENCOUNTER — Ambulatory Visit (INDEPENDENT_AMBULATORY_CARE_PROVIDER_SITE_OTHER): Payer: Medicaid Other | Admitting: Dermatology

## 2021-08-21 DIAGNOSIS — D2372 Other benign neoplasm of skin of left lower limb, including hip: Secondary | ICD-10-CM | POA: Diagnosis not present

## 2021-08-21 DIAGNOSIS — D239 Other benign neoplasm of skin, unspecified: Secondary | ICD-10-CM

## 2021-08-21 NOTE — Progress Notes (Signed)
   Follow-Up Visit   Subjective  [name] [name] is a 60 y.o. male who presents for the following: Follow-up (Patient here today for a spot on left side upper thigh. He reports spot has been there for years but it has grown larger and is sore today. ).  The following portions of the chart were reviewed this encounter and updated as appropriate:  Tobacco  Allergies  Meds  Problems  Med Hx  Surg Hx  Fam Hx      Objective  Well appearing patient in no apparent distress; mood and affect are within normal limits.  A focused examination was performed including left superior thigh. Relevant physical exam findings are noted in the Assessment and Plan.  left superior thigh 0.8 cm Firm pink/brown papulenodule with dimple sign  Assessment & Plan  Dermatofibroma -symptomatic left superior lateral thigh Patient reports has become irritated and would like to have removed Discussed removal  Discussed patient will need to schedule excision  Return for schedule surgery to have dermatofibroma remove on left superior thigh. IRuthell Rummage, CMA, am acting as scribe for Sarina Ser, MD. Documentation: I have reviewed the above documentation for accuracy and completeness, and I agree with the above.  Sarina Ser, MD

## 2021-08-21 NOTE — Patient Instructions (Signed)

## 2021-08-26 ENCOUNTER — Ambulatory Visit
Admission: RE | Admit: 2021-08-26 | Discharge: 2021-08-26 | Disposition: A | Discharge status: Home / Self Care | Payer: Medicaid Other | Source: Ambulatory Visit | Attending: Family Medicine | Admitting: Family Medicine

## 2021-08-26 ENCOUNTER — Other Ambulatory Visit: Payer: Self-pay

## 2021-08-26 DIAGNOSIS — M5412 Radiculopathy, cervical region: Secondary | ICD-10-CM | POA: Insufficient documentation

## 2021-08-26 DIAGNOSIS — M542 Cervicalgia: Secondary | ICD-10-CM | POA: Diagnosis not present

## 2021-08-27 ENCOUNTER — Encounter: Payer: Self-pay | Admitting: Family Medicine

## 2021-08-27 DIAGNOSIS — M5412 Radiculopathy, cervical region: Secondary | ICD-10-CM | POA: Diagnosis not present

## 2021-08-27 DIAGNOSIS — M5416 Radiculopathy, lumbar region: Secondary | ICD-10-CM | POA: Diagnosis not present

## 2021-08-27 DIAGNOSIS — M5030 Other cervical disc degeneration, unspecified cervical region: Secondary | ICD-10-CM | POA: Diagnosis not present

## 2021-08-27 DIAGNOSIS — M5136 Other intervertebral disc degeneration, lumbar region: Secondary | ICD-10-CM | POA: Diagnosis not present

## 2021-08-28 ENCOUNTER — Other Ambulatory Visit: Payer: Self-pay

## 2021-08-28 ENCOUNTER — Ambulatory Visit: Payer: Medicaid Other | Admitting: Gastroenterology

## 2021-08-28 ENCOUNTER — Encounter: Payer: Self-pay | Admitting: Gastroenterology

## 2021-08-28 VITALS — BP 154/83 | HR 92 | Temp 98.2°F | Ht 71.0 in | Wt 212.4 lb

## 2021-08-28 DIAGNOSIS — R634 Abnormal weight loss: Secondary | ICD-10-CM

## 2021-08-28 DIAGNOSIS — R1319 Other dysphagia: Secondary | ICD-10-CM | POA: Diagnosis not present

## 2021-08-28 DIAGNOSIS — R1310 Dysphagia, unspecified: Secondary | ICD-10-CM

## 2021-08-28 MED ORDER — OMEPRAZOLE 40 MG PO CPDR: 40.0000 mg | DELAYED_RELEASE_CAPSULE | Freq: Two times a day (BID) | ORAL | 2 refills | Status: DC

## 2021-08-28 NOTE — Progress Notes (Signed)
[name], MD [address]  [place]  [place], [place] [address]  Main: [phone]  Fax: [phone]    Gastroenterology Consultation  Referring Provider:     Steele Sizer, MD Primary Care Physician:  Steele Sizer, MD Primary Gastroenterologist:  Dr. Vicente Males Reason for Consultation: Unintentional weight loss, difficulty swallowing, hoarseness of voice        HPI:   [name] [name] is a 60 y.o. African-American male referred by Dr. Steele Sizer, MD  for consultation & management of difficulty swallowing, choking episodes with solids that has been ongoing for almost a year.  He also reports hoarseness of voice for last 3 months.  He reports that sometimes even drinking liquids because choking sensation.  He lost about 45 pounds since October last year due to his ongoing symptoms.  He has been taking Protonix 40 mg p.o. daily.  He reports that he has been evaluated by neuro surgery, neurology, cardiology as well as pulmonary and ENT for his symptoms and etiology has not been found yet.  He does have severe B12 deficiency His labs are unremarkable NSAIDs: None  Antiplts/Anticoagulants/Anti thrombotics: None  GI Procedures:  EGD 01/26/2019 - Normal duodenal bulb and second portion of the duodenum. - Normal stomach. Biopsied. - Esophagogastric landmarks identified. - Normal gastroesophageal junction and esophagus.  DIAGNOSIS:  A.  STOMACH; COLD BIOPSY:  - ANTRAL MUCOSA WITH MILD NON-SPECIFIC CHRONIC AND FOCAL ACTIVE  GASTRITIS.  - UNREMARKABLE OXYNTIC MUCOSA.  - NEGATIVE FOR HELICOBACTER; IHC STAIN EXAMINED.  - NEGATIVE FOR INTESTINAL METAPLASIA, DYSPLASIA, AND MALIGNANCY.   Colonoscopy in 2017 and 2018 by Dr. Vicente Males - Preparation of the colon was poor. - Stool in the sigmoid colon. - No specimens collected.  - Three 5 to 7 mm polyps in the transverse colon, removed with a cold snare. Resected and retrieved. - One 3 mm polyp in the ascending colon, removed  with a cold biopsy forceps. Resected and retrieved. - Non-bleeding internal hemorrhoids. - The examination was otherwise normal on direct and retroflexion views.  DIAGNOSIS:  A. COLON POLYP, ASCENDING; COLD BIOPSY:  - TUBULAR ADENOMA.  - NEGATIVE FOR HIGH-GRADE DYSPLASIA AND MALIGNANCY.   B.  COLON POLYP 2, TRANSVERSE; COLD SNARE:  - TUBULAR ADENOMAS (2), NEGATIVE FOR HIGH-GRADE DYSPLASIA AND  MALIGNANCY.  - POLYPOID COLONIC MUCOSA WITH HEMORRHAGE OF THE LAMINA PROPRIA (1).    Past Medical History:  Diagnosis Date   Allergy    Angina, class III (HCC)    Chronic constipation    Degeneration of intervertebral disc of cervical region    Diabetes mellitus without complication (HCC)    Diastolic dysfunction    a. 06/2017 Echo: EF 60-65%, no rwma, Gr1 DD.   Dyspnea    GERD (gastroesophageal reflux disease)    Hernia 1991   02/10/2012-RIH repair   Hyperlipidemia    Hypertension 2008   Nerve root pain    Neuropathy    Non-obstructive CAD (coronary artery disease)    a. 05/30/2015 cath: LM nl, mLAD 50% (FFR 0.83), LCx minor irregs, RCA minor irregs, EF 55-65%-->Med Rx; b. 03/2016 Cath: LM nl LAD 103m D1/2/3 min irregs, LCX min irregs, OM1/2/3 nl, RCA min irregs, RPDA/RPAV/RPL1/RPL2 nl-->Med Rx; b. 07/2020 Cath: LM nl, LAD 338mD1/2/3 min irregs, LCX min irregs, RCA min irregs, RPDA/RPAV/RPL1,2 nl, EF 55-65%.   Obesity, unspecified 2012   Personal history of tobacco use, presenting hazards to health 2012   Polycythemia    Rectal bleeding 02/11/2017  Recurrent Right Inguinal Hernia Repair 02/09/2011, 04/07/2013   Dr [name], [facility]   Sleep apnea    a. On CPAP;  b. 06/2017 Echo: no PAH.   Umbilical hernia without mention of obstruction or gangrene 02/09/2011   Dr [name], [facility]   Vitamin D deficiency     Past Surgical History:  Procedure Laterality Date   BACK SURGERY  12/2008   [facility]   CARDIAC CATHETERIZATION N/A 05/30/2015   Procedure: Left Heart Cath;  Surgeon: Wellington Hampshire, MD;   Location: [place] [facility];  Service: Cardiovascular;  Laterality: N/A;   CARDIAC CATHETERIZATION N/A 04/16/2016   Procedure: Left Heart Cath and Coronary Angiography;  Surgeon: Wellington Hampshire, MD;  Location: [place] [facility];  Service: Cardiovascular;  Laterality: N/A;   COLONOSCOPY  2014   Dr. [name]   COLONOSCOPY WITH PROPOFOL N/A 12/25/2016   Procedure: COLONOSCOPY WITH PROPOFOL;  Surgeon: Jonathon Bellows, MD;  Location: [facility] ENDOSCOPY;  Service: Endoscopy;  Laterality: N/A;   COLONOSCOPY WITH PROPOFOL N/A 01/29/2017   Procedure: COLONOSCOPY WITH PROPOFOL;  Surgeon: Jonathon Bellows, MD;  Location: [facility] ENDOSCOPY;  Service: Endoscopy;  Laterality: N/A;   COLONOSCOPY WITH PROPOFOL N/A 04/11/2020   Procedure: COLONOSCOPY WITH PROPOFOL;  Surgeon: Jonathon Bellows, MD;  Location: [facility] ENDOSCOPY;  Service: Gastroenterology;  Laterality: N/A;   ESOPHAGOGASTRODUODENOSCOPY (EGD) WITH PROPOFOL N/A 01/26/2019   Procedure: ESOPHAGOGASTRODUODENOSCOPY (EGD) WITH PROPOFOL;  Surgeon: Lin Landsman, MD;  Location: [facility] ENDOSCOPY;  Service: Gastroenterology;  Laterality: N/A;   EVALUATION UNDER ANESTHESIA WITH HEMORRHOIDECTOMY N/A 02/24/2017   Procedure: EXAM UNDER ANESTHESIA WITH POSSIBLE EXCISION OF INTERNAL HEMORRHOIDS;  Surgeon: Olean Ree, MD;  Location: [facility] ORS;  Service: General;  Laterality: N/A;   FISSURECTOMY  02/24/2017   Procedure: FISSURECTOMY;  Surgeon: Olean Ree, MD;  Location: [facility] ORS;  Service: General;;   HAND SURGERY Left 2020   HERNIA REPAIR Right 1991   INGUINAL HERNIA REPAIR Right 2012   Dr Teryl Lucy HERNIA REPAIR Right 2014   Dr [name]   INTRAVASCULAR PRESSURE WIRE/FFR STUDY N/A 07/30/2021   Procedure: INTRAVASCULAR PRESSURE WIRE/FFR STUDY;  Surgeon: Wellington Hampshire, MD;  Location: [place] [facility];  Service: Cardiovascular;  Laterality: N/A;   LEFT HEART CATH AND CORONARY ANGIOGRAPHY Left 08/05/2020   Procedure: LEFT HEART CATH AND CORONARY ANGIOGRAPHY poss PCI;  Surgeon:  Wellington Hampshire, MD;  Location: [place] [facility];  Service: Cardiovascular;  Laterality: Left;   RIGHT/LEFT HEART CATH AND CORONARY ANGIOGRAPHY N/A 07/30/2021   Procedure: RIGHT/LEFT HEART CATH AND CORONARY ANGIOGRAPHY;  Surgeon: Wellington Hampshire, MD;  Location: [place] [facility];  Service: Cardiovascular;  Laterality: N/A;     Current Outpatient Medications:    ACCU-CHEK GUIDE test strip, USE AS DIRECTED TO TEST BLOOD SUGAR THREE TIMES DAILY AND AS NEEDED, Disp: 100 strip, Rfl: 1   Accu-Chek Softclix Lancets lancets, TEST TWICE DAILY, Disp: 100 each, Rfl: 3   aspirin 81 MG tablet, Take 81 mg by mouth daily., Disp: , Rfl:    blood glucose meter kit and supplies, 1 each by Other route in the morning and at bedtime. Dispense based on patient and insurance preference. Use up to two  times daily. (FOR ICD-10 E10.9, E11.9)., Disp: 1 each, Rfl: 0   BREO ELLIPTA 100-25 MCG/INH AEPB, Inhale 1 puff into the lungs daily., Disp: , Rfl:    carvedilol (COREG) 6.25 MG tablet, Take 1 tablet (6.25 mg total) by mouth 2 (two) times daily., Disp:  180 tablet, Rfl: 1   cetirizine (ZYRTEC) 10 MG tablet, Take 10 mg by mouth daily as needed for allergies. , Disp: , Rfl: 1   dicyclomine (BENTYL) 10 MG capsule, Take 1 capsule (10 mg total) by mouth daily as needed for spasms., Disp: , Rfl:    Dulaglutide (TRULICITY) 6.62 HU/7.6LY SOPN, Inject 0.75 mg into the skin once a week. Monday, Disp: , Rfl:    ezetimibe (ZETIA) 10 MG tablet, Take 1 tablet (10 mg total) by mouth daily., Disp: 90 tablet, Rfl: 1   fluticasone (FLONASE) 50 MCG/ACT nasal spray, Place 2 sprays into both nostrils daily as needed for allergies or rhinitis., Disp: , Rfl:    HYDROcodone-acetaminophen (NORCO/VICODIN) 5-325 MG tablet, 1/2 to 1 tab po BID prn., Disp: , Rfl:    Ipratropium-Albuterol (COMBIVENT) 20-100 MCG/ACT AERS respimat, Inhale 1 puff into the lungs 2 (two) times daily., Disp: , Rfl:    ketoconazole (NIZORAL) 2 % shampoo, Apply 1  application topically 3 (three) times a week. Wash scalp/body 3 times a week as needed for alres, let sit 5 minutes before rinsing off, avoid eyes. May use once monthly for maintenance., Disp: 120 mL, Rfl: 11   losartan-hydrochlorothiazide (HYZAAR) 50-12.5 MG tablet, Take 1 tablet by mouth daily., Disp: , Rfl:    lubiprostone (AMITIZA) 24 MCG capsule, Take 1 capsule (24 mcg total) by mouth 2 (two) times daily with a meal., Disp: , Rfl:    modafinil (PROVIGIL) 200 MG tablet, Take by mouth., Disp: , Rfl:    nitroGLYCERIN (NITROSTAT) 0.4 MG SL tablet, Place 1 tablet (0.4 mg total) under the tongue every 5 (five) minutes as needed for chest pain., Disp: , Rfl:    omeprazole (PRILOSEC) 40 MG capsule, Take 1 capsule (40 mg total) by mouth 2 (two) times daily before a meal., Disp: 60 capsule, Rfl: 2   pregabalin (LYRICA) 50 MG capsule, Take 1 capsule (50 mg total) by mouth 3 (three) times daily., Disp: 270 capsule, Rfl: 1   ranolazine (RANEXA) 1000 MG SR tablet, Take 1 tablet (1,000 mg total) by mouth 2 (two) times daily., Disp: 180 tablet, Rfl: 3   sildenafil (REVATIO) 20 MG tablet, Take by mouth., Disp: , Rfl:    Family History  Problem Relation Age of Onset   Heart Problems Mother    Heart Problems Father 53       myocardial infarction   Mental illness Brother    Testicular cancer Paternal Uncle    Other Maternal Aunt        COVID   Breast cancer Cousin      Social History   Tobacco Use   Smoking status: Former    Types: Cigars    Start date: 12/28/2002    Quit date: 06/04/2005    Years since quitting: 16.2   Smokeless tobacco: Never   Tobacco comments:    quit 2006-smoked 1 cigar occ  Vaping Use   Vaping Use: Never used  Substance Use Topics   Alcohol use: Yes    Alcohol/week: 1.0 standard drink    Types: 1 Glasses of wine per week    Comment: RARE BEER   Drug use: No    Allergies as of 08/28/2021 - Review Complete 08/28/2021  Allergen Reaction Noted   Gabapentin Other (See  Comments) 04/16/2015   Latex Rash 06/20/2015   Penicillins Hives and Rash 03/23/2013    Review of Systems:    All systems reviewed and negative except where noted in HPI.  Physical Exam:  BP (!) 154/83 (BP Location: Left Arm, Patient Position: Sitting, Cuff Size: Normal)   Pulse 92   Temp 98.2 F (36.8 C) (Oral)   Ht _0  (1.803 m)   Wt 212 lb 6 oz (96.3 kg)   BMI 29.62 kg/m  No LMP for male patient.  General:   Alert,  Well-developed, well-nourished, pleasant and cooperative in NAD Head:  Normocephalic and atraumatic. Eyes:  Sclera clear, no icterus.   Conjunctiva pink. Ears:  Normal auditory acuity. Nose:  No deformity, discharge, or lesions. Mouth:  No deformity or lesions,oropharynx pink & moist. Neck:  Supple; no masses or thyromegaly. Lungs:  Respirations even and unlabored.  Clear throughout to auscultation.   No wheezes, crackles, or rhonchi. No acute distress. Heart:  Regular rate and rhythm; no murmurs, clicks, rubs, or gallops. Abdomen:  Normal bowel sounds. Soft, non-tender and non-distended without masses, hepatosplenomegaly or hernias noted.  No guarding or rebound tenderness.   Rectal: Not performed Msk:  Symmetrical without gross deformities. Good, equal movement & strength bilaterally. Pulses:  Normal pulses noted. Extremities:  No clubbing or edema.  No cyanosis. Neurologic:  Alert and oriented x3;  grossly normal neurologically. Skin:  Intact without significant lesions or rashes. No jaundice. Psych:  Alert and cooperative. Normal mood and affect.  Imaging Studies: Reviewed  Assessment and Plan:   [name] [name] is a 60 y.o. African-American male with metabolic syndrome is seen in for consultation of difficulty swallowing, particularly to solids, unexplained weight loss   Dysphagia and unintentional weight loss Recommend EGD with esophageal biopsies and if there is no evidence of stricture, recommend empiric dilation If EGD is unremarkable,  recommend esophageal manometry and CT chest, abdomen and pelvis Switch from Protonix to omeprazole 40 mg p.o. twice daily before meals  Tubular adenomas of colon Recommend surveillance colonoscopy in 2026  Follow up in 3 months or sooner   [name], MD

## 2021-08-28 NOTE — Patient Instructions (Signed)
Please stop Pantoprazole and start omeprazole

## 2021-08-29 ENCOUNTER — Other Ambulatory Visit: Payer: Self-pay | Admitting: Family Medicine

## 2021-08-29 DIAGNOSIS — R1310 Dysphagia, unspecified: Secondary | ICD-10-CM

## 2021-09-09 ENCOUNTER — Encounter: Payer: Self-pay | Admitting: Gastroenterology

## 2021-09-10 ENCOUNTER — Ambulatory Visit: Payer: Medicaid Other | Admitting: Anesthesiology

## 2021-09-10 ENCOUNTER — Encounter
Admission: RE | Disposition: A | Discharge status: Home / Self Care | Payer: Self-pay | Source: Home / Self Care | Attending: Gastroenterology

## 2021-09-10 ENCOUNTER — Ambulatory Visit
Admission: RE | Admit: 2021-09-10 | Discharge: 2021-09-10 | Disposition: A | Discharge status: Home / Self Care | Payer: Medicaid Other | Attending: Gastroenterology | Admitting: Gastroenterology

## 2021-09-10 DIAGNOSIS — R1314 Dysphagia, pharyngoesophageal phase: Secondary | ICD-10-CM | POA: Insufficient documentation

## 2021-09-10 DIAGNOSIS — Z888 Allergy status to other drugs, medicaments and biological substances status: Secondary | ICD-10-CM | POA: Insufficient documentation

## 2021-09-10 DIAGNOSIS — Z91040 Latex allergy status: Secondary | ICD-10-CM | POA: Diagnosis not present

## 2021-09-10 DIAGNOSIS — K219 Gastro-esophageal reflux disease without esophagitis: Secondary | ICD-10-CM | POA: Diagnosis not present

## 2021-09-10 DIAGNOSIS — R634 Abnormal weight loss: Secondary | ICD-10-CM

## 2021-09-10 DIAGNOSIS — R1310 Dysphagia, unspecified: Secondary | ICD-10-CM

## 2021-09-10 DIAGNOSIS — Z880 Allergy status to penicillin: Secondary | ICD-10-CM | POA: Diagnosis not present

## 2021-09-10 DIAGNOSIS — Z79899 Other long term (current) drug therapy: Secondary | ICD-10-CM | POA: Diagnosis not present

## 2021-09-10 DIAGNOSIS — Z87891 Personal history of nicotine dependence: Secondary | ICD-10-CM | POA: Insufficient documentation

## 2021-09-10 DIAGNOSIS — Z7951 Long term (current) use of inhaled steroids: Secondary | ICD-10-CM | POA: Diagnosis not present

## 2021-09-10 DIAGNOSIS — Z7982 Long term (current) use of aspirin: Secondary | ICD-10-CM | POA: Insufficient documentation

## 2021-09-10 HISTORY — PX: ESOPHAGOGASTRODUODENOSCOPY (EGD) WITH PROPOFOL: SHX5813

## 2021-09-10 LAB — GLUCOSE, CAPILLARY: Glucose-Capillary: 94 mg/dL (ref 70–99)

## 2021-09-10 SURGERY — ESOPHAGOGASTRODUODENOSCOPY (EGD) WITH PROPOFOL
Anesthesia: General

## 2021-09-10 MED ORDER — PROPOFOL 500 MG/50ML IV EMUL
INTRAVENOUS | Status: AC
  Filled 2021-09-10: qty 50

## 2021-09-10 MED ORDER — LIDOCAINE HCL (PF) 2 % IJ SOLN
INTRAMUSCULAR | Status: AC
  Filled 2021-09-10: qty 5

## 2021-09-10 MED ORDER — PROPOFOL 500 MG/50ML IV EMUL
INTRAVENOUS | Status: DC | PRN
  Administered 2021-09-10: 175 ug/kg/min via INTRAVENOUS

## 2021-09-10 MED ORDER — SODIUM CHLORIDE 0.9 % IV SOLN
INTRAVENOUS | Status: DC
  Administered 2021-09-10: 20 mL/h via INTRAVENOUS

## 2021-09-10 MED ORDER — LIDOCAINE HCL (CARDIAC) PF 100 MG/5ML IV SOSY
PREFILLED_SYRINGE | INTRAVENOUS | Status: DC | PRN
  Administered 2021-09-10: 50 mg via INTRAVENOUS

## 2021-09-10 MED ORDER — PHENYLEPHRINE HCL (PRESSORS) 10 MG/ML IV SOLN
INTRAVENOUS | Status: DC | PRN
  Administered 2021-09-10: 100 ug via INTRAVENOUS

## 2021-09-10 MED ORDER — PROPOFOL 10 MG/ML IV BOLUS
INTRAVENOUS | Status: DC | PRN
  Administered 2021-09-10: 80 mg via INTRAVENOUS
  Administered 2021-09-10: 20 mg via INTRAVENOUS
  Administered 2021-09-10: 50 mg via INTRAVENOUS

## 2021-09-10 MED ORDER — DEXMEDETOMIDINE (PRECEDEX) IN NS 20 MCG/5ML (4 MCG/ML) IV SYRINGE
PREFILLED_SYRINGE | INTRAVENOUS | Status: DC | PRN
  Administered 2021-09-10: 12 ug via INTRAVENOUS

## 2021-09-10 NOTE — Op Note (Signed)
[facility] Gastroenterology Patient Name: [name] [name] Procedure Date: 09/10/2021 8:20 AM MRN: [id number] Account #: [id number] Date of Birth: 12-Jan-1961 Admit Type: Outpatient Age: 60 Room: [facility] ENDO ROOM 4 Gender: Male Note Status: Finalized Instrument Name: Upper Endoscope [id number] Procedure:             Upper GI endoscopy Indications:           Esophageal dysphagia, Weight loss Providers:             Lin Landsman MD, MD Referring MD:          Delsa Grana (Referring MD) Medicines:             General Anesthesia Complications:         No immediate complications. Estimated blood loss: None. Procedure:             Pre-Anesthesia Assessment:                        - Prior to the procedure, a History and Physical was                         performed, and patient medications and allergies were                         reviewed. The patient is competent. The risks and                         benefits of the procedure and the sedation options and                         risks were discussed with the patient. All questions                         were answered and informed consent was obtained.                         Patient identification and proposed procedure were                         verified by the physician, the nurse, the                         anesthesiologist, the anesthetist and the technician                         in the pre-procedure area in the procedure room in the                         endoscopy suite. Mental Status Examination: alert and                         oriented. Airway Examination: normal oropharyngeal                         airway and neck mobility. Respiratory Examination:                         clear to auscultation. CV Examination: normal.  Prophylactic Antibiotics: The patient does not require                         prophylactic antibiotics. Prior Anticoagulants: The                         patient  has taken no previous anticoagulant or                         antiplatelet agents. ASA Grade Assessment: III - A                         patient with severe systemic disease. After reviewing                         the risks and benefits, the patient was deemed in                         satisfactory condition to undergo the procedure. The                         anesthesia plan was to use general anesthesia.                         Immediately prior to administration of medications,                         the patient was re-assessed for adequacy to receive                         sedatives. The heart rate, respiratory rate, oxygen                         saturations, blood pressure, adequacy of pulmonary                         ventilation, and response to care were monitored                         throughout the procedure. The physical status of the                         patient was re-assessed after the procedure.                        After obtaining informed consent, the endoscope was                         passed under direct vision. Throughout the procedure,                         the patient's blood pressure, pulse, and oxygen                         saturations were monitored continuously. The                         Endosonoscope was introduced through the mouth, and  advanced to the second part of duodenum. The upper GI                         endoscopy was accomplished without difficulty. The                         patient tolerated the procedure well. Findings:      The duodenal bulb and second portion of the duodenum were normal.      The entire examined stomach was normal.      The cardia and gastric fundus were normal on retroflexion.      Esophagogastric landmarks were identified: the gastroesophageal junction       was found at 43 cm from the incisors.      The gastroesophageal junction and examined esophagus were normal. A TTS        dilator was passed through the scope. Empiric Dilation with a 12-13.5-15       mm x 8 cm CRE balloon and a 15-16.5-18 mm x 8 cm CRE balloon dilator was       performed to 18 mm given symptoms of dysphagia. Biopsies were taken with       a cold forceps for histology. Impression:            - Normal duodenal bulb and second portion of the                         duodenum.                        - Normal stomach.                        - Esophagogastric landmarks identified.                        - Normal gastroesophageal junction and esophagus.                         Dilated. Biopsied. Recommendation:        - Await pathology results.                        - Discharge patient to home (with escort).                        - Resume previous diet today.                        - Continue present medications.                        - Follow an antireflux regimen.                        - Return to my office as previously scheduled. Procedure Code(s):     --- Professional ---                        [phone], Esophagogastroduodenoscopy, flexible,                         transoral; with transendoscopic balloon dilation of  esophagus (less than 30 mm diameter)                        43239, 60, Esophagogastroduodenoscopy, flexible,                         transoral; with biopsy, single or multiple Diagnosis Code(s):     --- Professional ---                        R13.14, Dysphagia, pharyngoesophageal phase                        R63.4, Abnormal weight loss CPT copyright 2019 [employer]. All rights reserved. The codes documented in this report are preliminary and upon coder review may  be revised to meet current compliance requirements. Dr. Ulyess Mort Lin Landsman MD, MD 09/10/2021 8:50:33 AM This report has been signed electronically. Number of Addenda: 0 Note Initiated On: 09/10/2021 8:20 AM Estimated Blood Loss:  Estimated blood loss: none.       [facility]

## 2021-09-10 NOTE — Transfer of Care (Signed)
Immediate Anesthesia Transfer of Care Note  Patient: [name] [name]  Procedure(s) Performed: ESOPHAGOGASTRODUODENOSCOPY (EGD) WITH PROPOFOL  Patient Location: PACU  Anesthesia Type:General  Level of Consciousness: sedated  Airway & Oxygen Therapy: Patient Spontanous Breathing and Patient connected to nasal cannula oxygen  Post-op Assessment: Report given to RN and Post -op Vital signs reviewed and stable  Post vital signs: Reviewed and stable  Last Vitals:  Vitals Value Taken Time  BP 91/49 09/10/21 0855  Temp 36.2 C 09/10/21 0855  Pulse 72 09/10/21 0857  Resp 18 09/10/21 0857  SpO2 95 % 09/10/21 0857  Vitals shown include unvalidated device data.  Last Pain:  Vitals:   09/10/21 0855  TempSrc: Temporal  PainSc: Asleep         Complications: No notable events documented.

## 2021-09-10 NOTE — H&P (Signed)
[name] Darby, MD [address]  [place]  [place], [place] [address]  Main: [phone]  Fax: [phone] Pager: [phone]  Primary Care Physician:  [name] Sizer, MD Primary Gastroenterologist:  Dr. Cephas [name]  Pre-Procedure History & Physical: HPI:  [name] [name] is a 60 y.o. male is here for an endoscopy.   Past Medical History:  Diagnosis Date   Allergy    Angina, class III (HCC)    Chronic constipation    Degeneration of intervertebral disc of cervical region    Diabetes mellitus without complication (HCC)    Diastolic dysfunction    a. 06/2017 Echo: EF 60-65%, no rwma, Gr1 DD.   Dyspnea    GERD (gastroesophageal reflux disease)    Hernia 1991   02/10/2012-RIH repair   Hyperlipidemia    Hypertension 2008   Nerve root pain    Neuropathy    Non-obstructive CAD (coronary artery disease)    a. 05/30/2015 cath: LM nl, mLAD 50% (FFR 0.83), LCx minor irregs, RCA minor irregs, EF 55-65%-->Med Rx; b. 03/2016 Cath: LM nl LAD 38m D1/2/3 min irregs, LCX min irregs, OM1/2/3 nl, RCA min irregs, RPDA/RPAV/RPL1/RPL2 nl-->Med Rx; b. 07/2020 Cath: LM nl, LAD 325mD1/2/3 min irregs, LCX min irregs, RCA min irregs, RPDA/RPAV/RPL1,2 nl, EF 55-65%.   Obesity, unspecified 2012   Personal history of tobacco use, presenting hazards to health 2012   Polycythemia    Rectal bleeding 02/11/2017   Recurrent Right Inguinal Hernia Repair 02/09/2011, 04/07/2013   [name] Sleep apnea    a. On CPAP;  b. 06/2017 Echo: no PAH.   Umbilical hernia without mention of obstruction or gangrene 02/09/2011   [name] Vitamin D deficiency     Past Surgical History:  Procedure Laterality Date   BACK SURGERY  12/2008   [name] C[facility]ATION N/A 05/30/2015   Procedure: Left Heart Cath;  Surgeon: [name] HampshireMD;  [place];  Service: Cardiovascular;  Laterality: N/A;   CARDIAC CATHETERIZATION N/A 04/16/2016   Procedure: Left Heart Cath and  Coronary Angiography;  Surgeon: [name] HampshireMD;  [facility];  Service: Cardiovascular;  Laterality: N/A;   COLONOSCOPY  2014   Dr. SaJamal [name] COLONOSCOPY WITH PROPOFOL N/A 12/25/2016   Procedure: COLONOSCOPY WITH PROPOFOL;  Surgeon: [name] BellowsMD;  Location: [facility];  Service: Endoscopy;  Laterality: N/A;   COLONOSCOPY WITH PROPOFOL N/A 01/29/2017   Procedure: COLONOSCOPY WITH PROPOFOL;  Surgeon: [name] BellowsMD;  Location: [facility];  Service: Endoscopy;  Laterality: N/A;   COLONOSCOPY WITH PROPOFOL N/A 04/11/2020   Procedure: COLONOSCOPY WITH PROPOFOL;  Surgeon: [name] BellowsMD;  [facility];  Service: Gastroenterology;  Laterality: N/A;   ESOPHAGOGASTRODUODENOSCOPY (EGD) WITH PROPOFOL N/A 01/26/2019   Procedure: ESOPHAGOGASTRODUODENOSCOPY (EGD) WITH PROPOFOL;  Surgeon: [name] LandsmanMD;  [facility];  Service: Gastroenterology;  Laterality: N/A;   EVALUATION UNDER ANESTHESIA WITH HEMORRHOIDECTOMY N/A 02/24/2017   Procedure: EXAM UNDER ANESTHESIA WITH POSSIBLE EXCISION OF INTERNAL HEMORRHOIDS;  Surgeon: [name] ReeMD;  Location: [facility];  Service: General;  Laterality: N/A;   FISSURECTOMY  02/24/2017   Procedure: FISSURECTOMY;  Surgeon: [name] ReeMD;  Location: [facility];  Service: General;;   HAND SURGERY Left 2020   HERNIA REPAIR Right 1991   INGUINAL HERNIA REPAIR Right 2012   Dr SaTeryl LucyERNIA REPAIR Right 2014   Dr [name] [name]  INTRAVASCULAR PRESSURE WIRE/FFR STUDY N/A 07/30/2021   Procedure: INTRAVASCULAR PRESSURE WIRE/FFR STUDY;  Surgeon: [name] Hampshire, MD;  Location: [place] CV LAB;  Service: Cardiovascular;  Laterality: N/A;   LEFT HEART CATH AND CORONARY ANGIOGRAPHY Left 08/05/2020   Procedure: LEFT HEART CATH AND CORONARY ANGIOGRAPHY poss PCI;  Surgeon: [name] Hampshire, MD;  Location: [place] CV LAB;  Service: Cardiovascular;  Laterality: Left;   RIGHT/LEFT HEART CATH AND CORONARY ANGIOGRAPHY  N/A 07/30/2021   Procedure: RIGHT/LEFT HEART CATH AND CORONARY ANGIOGRAPHY;  Surgeon: [name] Hampshire, MD;  Location: [place] CV LAB;  Service: Cardiovascular;  Laterality: N/A;    Prior to Admission medications   Medication Sig Start Date End Date Taking? Authorizing Provider  ACCU-CHEK GUIDE test strip USE AS DIRECTED TO TEST BLOOD SUGAR THREE TIMES DAILY AND AS NEEDED 08/14/21  Yes Sowles, [name] Stager, MD  Accu-Chek Softclix Lancets lancets TEST TWICE DAILY 10/11/20  Yes [name] Sizer, MD  aspirin 81 MG tablet Take 81 mg by mouth daily.   Yes [provider]  blood glucose meter kit and supplies 1 each by Other route in the morning and at bedtime. Dispense based on patient and insurance preference. Use up to two  times daily. (FOR ICD-10 E10.9, E11.9). 10/09/20  Yes Sowles, [name] Stager, MD  BREO ELLIPTA 100-25 MCG/INH AEPB Inhale 1 puff into the lungs daily. 06/20/21  Yes [provider]  carvedilol (COREG) 6.25 MG tablet Take 1 tablet (6.25 mg total) by mouth 2 (two) times daily. 07/21/21  Yes Sowles, [name] Stager, MD  cetirizine (ZYRTEC) 10 MG tablet Take 10 mg by mouth daily as needed for allergies.  07/09/16  Yes [provider]  dicyclomine (BENTYL) 10 MG capsule Take 1 capsule (10 mg total) by mouth daily as needed for spasms. 07/30/21  Yes [name] Hampshire, MD  Dulaglutide (TRULICITY) 1.96 QI/2.9NL SOPN Inject 0.75 mg into the skin once a week. Monday 07/30/21  Yes [name] Hampshire, MD  ezetimibe (ZETIA) 10 MG tablet Take 1 tablet (10 mg total) by mouth daily. 07/21/21  Yes Sowles, [name] Stager, MD  fluticasone (FLONASE) 50 MCG/ACT nasal spray Place 2 sprays into both nostrils daily as needed for allergies or rhinitis. 03/05/15  Yes [provider]  HYDROcodone-acetaminophen (NORCO/VICODIN) 5-325 MG tablet 1/2 to 1 tab po BID prn. 08/15/21  Yes [provider]  Ipratropium-Albuterol (COMBIVENT) 20-100 MCG/ACT AERS respimat Inhale 1 puff into the lungs 2 (two) times  daily. 07/30/21  Yes [name] Hampshire, MD  ketoconazole (NIZORAL) 2 % shampoo Apply 1 application topically 3 (three) times a week. Wash scalp/body 3 times a week as needed for alres, let sit 5 minutes before rinsing off, avoid eyes. May use once monthly for maintenance. 04/28/21  Yes Ralene Bathe, MD  losartan-hydrochlorothiazide (HYZAAR) 50-12.5 MG tablet Take 1 tablet by mouth daily. 03/23/21  Yes [provider]  lubiprostone (AMITIZA) 24 MCG capsule Take 1 capsule (24 mcg total) by mouth 2 (two) times daily with a meal. 07/30/21  Yes [name] Hampshire, MD  modafinil (PROVIGIL) 200 MG tablet Take by mouth. 12/04/16  Yes [provider]  nitroGLYCERIN (NITROSTAT) 0.4 MG SL tablet Place 1 tablet (0.4 mg total) under the tongue every 5 (five) minutes as needed for chest pain. 07/30/21  Yes [name] Hampshire, MD  omeprazole (PRILOSEC) 40 MG capsule Take 1 capsule (40 mg total) by mouth 2 (two) times daily before a meal. 08/28/21 09/27/21 Yes Sadiya Durand, Tally Due, MD  pregabalin [facility])  50 MG capsule Take 1 capsule (50 mg total) by mouth 3 (three) times daily. 07/21/21  Yes Sowles, [name] Stager, MD  ranolazine (RANEXA) 1000 MG SR tablet Take 1 tablet (1,000 mg total) by mouth 2 (two) times daily. 12/23/20  Yes [name] Hampshire, MD  sildenafil (REVATIO) 20 MG tablet Take by mouth. 02/11/17  Yes [provider]    Allergies as of 08/28/2021 - Review Complete 08/28/2021  Allergen Reaction Noted   Gabapentin Other (See Comments) 04/16/2015   Latex Rash 06/20/2015   Penicillins Hives and Rash 03/23/2013    Family History  Problem Relation Age of Onset   Heart Problems Mother    Heart Problems Father 47       myocardial infarction   Mental illness Brother    Testicular cancer Paternal Uncle    Other Maternal Aunt        COVID   Breast cancer Cousin     Social History   Socioeconomic History   Marital status: Single    Spouse name: Not on file   Number of children: Not on  file   Years of education: Not on file   Highest education level: Not on file  Occupational History   Not on file  Tobacco Use   Smoking status: Former    Types: Cigars    Start date: 12/28/2002    Quit date: 06/04/2005    Years since quitting: 16.2   Smokeless tobacco: Never   Tobacco comments:    quit 2006-smoked 1 cigar occ  Vaping Use   Vaping Use: Never used  Substance and Sexual Activity   Alcohol use: Yes    Alcohol/week: 1.0 standard drink    Types: 1 Glasses of wine per week    Comment: [employer]   Drug use: No   Sexual activity: Yes    Partners: Female  Other Topics Concern   Not on file  Social History Narrative   Not on file   Social Determinants of Health   Financial Resource Strain: Not on file  Food Insecurity: Not on file  Transportation Needs: Not on file  Physical Activity: Not on file  Stress: Not on file  Social Connections: Not on file  Intimate Partner Violence: Not on file    Review of Systems: See HPI, otherwise negative ROS  Physical Exam: BP 124/72   Pulse 75   Temp (!) 96.4 F (35.8 C) (Temporal)   Resp 20   Ht _0  (1.803 m)   Wt 98.9 kg   SpO2 98%   BMI 30.40 kg/m  General:   Alert,  pleasant and cooperative in NAD Head:  Normocephalic and atraumatic. Neck:  Supple; no masses or thyromegaly. Lungs:  Clear throughout to auscultation.    Heart:  Regular rate and rhythm. Abdomen:  Soft, nontender and nondistended. Normal bowel sounds, without guarding, and without rebound.   Neurologic:  Alert and  oriented x4;  grossly normal neurologically.  Impression/Plan: [name] [name] is here for an endoscopy to be performed for weight loss, dysphagia  Risks, benefits, limitations, and alternatives regarding  endoscopy have been reviewed with the patient.  Questions have been answered.  All parties agreeable.   [name], MD  09/10/2021, 8:14 AM

## 2021-09-10 NOTE — Anesthesia Procedure Notes (Signed)
Date/Time: 09/10/2021 8:27 AM Performed by: Johnna Acosta, CRNA Pre-anesthesia Checklist: Patient identified, Emergency Drugs available, Suction available, Patient being monitored and Timeout performed Patient Re-evaluated:Patient Re-evaluated prior to induction Oxygen Delivery Method: Nasal cannula Preoxygenation: Pre-oxygenation with 100% oxygen Induction Type: IV induction

## 2021-09-10 NOTE — Anesthesia Preprocedure Evaluation (Addendum)
Anesthesia Evaluation  Patient identified by MRN, date of birth, ID band Patient awake    Reviewed: Allergy & Precautions, NPO status , Patient's Chart, lab work & pertinent test results  History of Anesthesia Complications Negative for: history of anesthetic complications  Airway Mallampati: III  TM Distance: >3 FB Neck ROM: full    Dental  (+) Implants, Poor Dentition, Chipped, Missing, Partial Upper   Pulmonary shortness of breath and with exertion, asthma , sleep apnea , former smoker,    Pulmonary exam normal        Cardiovascular Exercise Tolerance: Good hypertension, (-) angina+ CAD  Normal cardiovascular exam     Neuro/Psych  Headaches, Seizures -,   Neuromuscular disease negative psych ROS   GI/Hepatic Neg liver ROS, GERD  Medicated and Controlled,  Endo/Other  diabetes, Type 2  Renal/GU negative Renal ROS  negative genitourinary   Musculoskeletal  (+) Arthritis ,   Abdominal   Peds  Hematology negative hematology ROS (+)   Anesthesia Other Findings Past Medical History: No date: Allergy No date: Angina, class III (HCC) No date: Chronic constipation No date: Degeneration of intervertebral disc of cervical region No date: Diabetes mellitus without complication (HCC) No date: Diastolic dysfunction     Comment:  a. 06/2017 Echo: EF 60-65%, no rwma, Gr1 DD. No date: Dyspnea No date: GERD (gastroesophageal reflux disease) 1991: Hernia     Comment:  02/10/2012-RIH repair No date: Hyperlipidemia 2008: Hypertension No date: Nerve root pain No date: Neuropathy No date: Non-obstructive CAD (coronary artery disease)     Comment:  a. 05/30/2015 cath: LM nl, mLAD 50% (FFR 0.83), LCx minor               irregs, RCA minor irregs, EF 55-65%-->Med Rx; b. 03/2016               Cath: LM nl LAD 15m D1/2/3 min irregs, LCX min irregs,               OM1/2/3 nl, RCA min irregs, RPDA/RPAV/RPL1/RPL2 nl-->Med                Rx; b. 07/2020 Cath: LM nl, LAD 380mD1/2/3 min irregs,               LCX min irregs, RCA min irregs, RPDA/RPAV/RPL1,2 nl, EF               55-65%. 2012: Obesity, unspecified 2012: Personal history of tobacco use, presenting hazards to health No date: Polycythemia 02/11/2017: Rectal bleeding 02/09/2011, 04/07/2013: Recurrent Right Inguinal Hernia Repair     Comment:  [facility] date: Sleep apnea     Comment:  a. On CPAP;  b. 06/2017 Echo: no PAH. [id number] hernia without mention of obstruction or  gangrene     Comment[facility]  [facility] date: Vitamin D deficiency  Past Surgical History: 12/2008: BACK SURGERY     Comment:  X2-LUMBAR 05/30/2015: CARDIAC CATHETERIZATION; N/A     Comment:  Procedure: Left Heart Cath;  Surgeon: MuWellington Hampshire              MD;  [facility];  Service:               Cardiovascular;  Laterality: N/A; 04/16/2016: CARDIAC CATHETERIZATION; N/A     Comment:  Procedure: Left Heart Cath and Coronary Angiography;                Surgeon: MuRogue Jury  Ferne Reus, MD;  Location: [place]               CV LAB;  Service: Cardiovascular;  Laterality: N/A; 2014: COLONOSCOPY     Comment:  Dr. [name] 12/25/2016: COLONOSCOPY WITH PROPOFOL; N/A     Comment:  Procedure: COLONOSCOPY WITH PROPOFOL;  Surgeon: Jonathon Bellows, MD;  Location: [facility] END[facility];  Service: Endoscopy;              Laterality: N/A; 01/29/2017: COLONOSCOPY WITH PROPOFOL; N/A     Comment:  Procedure: COLONOSCOPY WITH PROPOFOL;  Surgeon: Jonathon Bellows, MD;  Location: [facility];  Service: Endoscopy;              Laterality: N/A; 04/11/2020: COLONOSCOPY WITH PROPOFOL; N/A     Comment:  Procedure: COLONOSCOPY WITH PROPOFOL;  Surgeon: Jonathon Bellows, MD;  [facility]: [facility];  Service:               Gastroenterology;  Laterality: N/A; 01/26/2019: ESOPHAGOGASTRODUODENOSCOPY (EGD) WITH PROPOFOL; N/A     Comment:  Procedure:  ESOPHAGOGASTRODUODENOSCOPY (EGD) WITH               PROPOFOL;  Surgeon: Lin Landsman, MD;  Location:               [facility];  Service: Gastroenterology;  Laterality:               N/A; 02/24/2017: EVALUATION UNDER ANESTHESIA WITH HEMORRHOIDECTOMY; N/A     Comment:  Procedure: EXAM UNDER ANESTHESIA WITH POSSIBLE EXCISION               OF INTERNAL HEMORRHOIDS;  Surgeon: Olean Ree, MD;                Location: [place] ORS;  Service: General;  Laterality: N/A; 02/24/2017: FISSURECTOMY     Comment:  Procedure: FISSURECTOMY;  Surgeon: Olean Ree, MD;                Location: [facility];  Service: General;; 2020: HAND SURGERY; Left 1991: HERNIA REPAIR; Right 2012: INGUINAL HERNIA REPAIR; Right     Comment:  Dr [name] 2014: [place]; Right     Comment:  Dr [name] 07/30/2021: INTRAVASCULAR PRESSURE WIRE/FFR STUDY; N/A     Comment:  Procedure: INTRAVASCULAR PRESSURE WIRE/FFR STUDY;                Surgeon: Wellington Hampshire, MD;  Location: [place] CV              LAB;  Service: Cardiovascular;  Laterality: N/A; 08/05/2020: LEFT HEART CATH AND CORONARY ANGIOGRAPHY; Left     Comment:  Procedure: LEFT HEART CATH AND CORONARY ANGIOGRAPHY poss              PCI;  Surgeon: Wellington Hampshire, MD;  Location: [place] CV LAB;  Service: Cardiovascular;  Laterality:               Left; 07/30/2021: RIGHT/LEFT HEART CATH AND CORONARY ANGIOGRAPHY; N/A     Comment:  Procedure: RIGHT/LEFT HEART CATH AND CORONARY               ANGIOGRAPHY;  Surgeon: Wellington Hampshire, MD;  Location:               [place] CV LAB;  Service: Cardiovascular;                Laterality: N/A;  BMI    Body Mass Index: 30.40 kg/m      Reproductive/Obstetrics negative OB ROS                            Anesthesia Physical Anesthesia Plan  ASA: 3  Anesthesia Plan: General   Post-op Pain Management:    Induction: Intravenous  PONV Risk Score and Plan:  Propofol infusion and TIVA  Airway Management Planned: Natural Airway and Nasal Cannula  Additional Equipment:   Intra-op Plan:   Post-operative Plan:   Informed Consent: I have reviewed the patients History and Physical, chart, labs and discussed the procedure including the risks, benefits and alternatives for the proposed anesthesia with the patient or authorized representative who has indicated his/her understanding and acceptance.     Dental Advisory Given  Plan Discussed with: Anesthesiologist, CRNA and Surgeon  Anesthesia Plan Comments: (Patient reports that he leaves his upper partial in for these procedures.  That it is very difficult to remove.  He was consented for risk of damage to the partial and voiced understanding.  Patient consented for risks of anesthesia including but not limited to:  - adverse reactions to medications - risk of airway placement if required - damage to eyes, teeth, lips or other oral mucosa - nerve damage due to positioning  - sore throat or hoarseness - Damage to heart, brain, nerves, lungs, other parts of body or loss of life  Patient voiced understanding.)       Anesthesia Quick Evaluation

## 2021-09-10 NOTE — Anesthesia Postprocedure Evaluation (Signed)
Anesthesia Post Note  Patient: [name] [name]  Procedure(s) Performed: ESOPHAGOGASTRODUODENOSCOPY (EGD) WITH PROPOFOL  Patient location during evaluation: Endoscopy Anesthesia Type: General Level of consciousness: awake and alert Pain management: pain level controlled Vital Signs Assessment: post-procedure vital signs reviewed and stable Respiratory status: spontaneous breathing, nonlabored ventilation, respiratory function stable and patient connected to nasal cannula oxygen Cardiovascular status: blood pressure returned to baseline and stable Postop Assessment: no apparent nausea or vomiting Anesthetic complications: no   No notable events documented.   Last Vitals:  Vitals:   09/10/21 0915 09/10/21 0925  BP: 104/67 109/66  Pulse: 63 (!) 59  Resp: 13 19  Temp:    SpO2: 92% 96%    Last Pain:  Vitals:   09/10/21 0925  TempSrc:   PainSc: 0-No pain                 Precious Haws [employer]

## 2021-09-11 ENCOUNTER — Encounter: Payer: Self-pay | Admitting: Gastroenterology

## 2021-09-11 LAB — SURGICAL PATHOLOGY

## 2021-09-12 ENCOUNTER — Telehealth: Payer: Self-pay | Admitting: Gastroenterology

## 2021-09-12 ENCOUNTER — Telehealth: Payer: Self-pay

## 2021-09-12 DIAGNOSIS — R1310 Dysphagia, unspecified: Secondary | ICD-10-CM

## 2021-09-12 DIAGNOSIS — R634 Abnormal weight loss: Secondary | ICD-10-CM

## 2021-09-12 NOTE — Telephone Encounter (Signed)
Documented in other telephone call

## 2021-09-12 NOTE — Telephone Encounter (Signed)
-----   Message from Lin Landsman, MD sent at 09/12/2021 10:30 AM EDT ----- Please inform patient that the pathology results came back normal.  Continue omeprazole 40 mg p.o. twice daily before meals  Recommend CT chest with contrast Dx: Unintentional weight loss  RV

## 2021-09-12 NOTE — Telephone Encounter (Signed)
Patient called back in [place] and called patient and left a message for call back

## 2021-09-12 NOTE — Telephone Encounter (Signed)
Called and left a message for call back. Order the CT chest with contrast. Called and got patient schedule for 10/01/2021 at 9:00am arrived at 8:45am at out patient imaging. Nothing to eat or drink after midnight. Address is [address], [place], [place] [address]. Phone number is [phone]

## 2021-09-12 NOTE — Telephone Encounter (Signed)
Pt. Returning call about results. He said he just missed the call

## 2021-09-12 NOTE — Telephone Encounter (Signed)
Called and patient verbalized understanding of results and instructions of his CT scan

## 2021-09-23 ENCOUNTER — Encounter: Payer: Self-pay | Admitting: Family Medicine

## 2021-09-26 ENCOUNTER — Other Ambulatory Visit: Payer: Self-pay | Admitting: Family Medicine

## 2021-09-26 DIAGNOSIS — E1129 Type 2 diabetes mellitus with other diabetic kidney complication: Secondary | ICD-10-CM

## 2021-09-30 ENCOUNTER — Encounter: Payer: Self-pay | Admitting: Dermatology

## 2021-09-30 ENCOUNTER — Other Ambulatory Visit: Payer: Self-pay

## 2021-09-30 ENCOUNTER — Ambulatory Visit (INDEPENDENT_AMBULATORY_CARE_PROVIDER_SITE_OTHER): Payer: Medicaid Other | Admitting: Dermatology

## 2021-09-30 DIAGNOSIS — D485 Neoplasm of uncertain behavior of skin: Secondary | ICD-10-CM

## 2021-09-30 DIAGNOSIS — D492 Neoplasm of unspecified behavior of bone, soft tissue, and skin: Secondary | ICD-10-CM

## 2021-09-30 MED ORDER — MUPIROCIN 2 % EX OINT: 1.0000 "application " | TOPICAL_OINTMENT | Freq: Every day | CUTANEOUS | 1 refills | Status: DC

## 2021-09-30 NOTE — Progress Notes (Signed)
   Follow-Up Visit   Subjective  [name] [name] is a 60 y.o. male who presents for the following: Dermatofibroma (L sup lat thigh, area has flattened out and softened up since last visit, pt presents for excision).  The following portions of the chart were reviewed this encounter and updated as appropriate:   Tobacco  Allergies  Meds  Problems  Med Hx  Surg Hx  Fam Hx     Review of Systems:  No other skin or systemic complaints except as noted in HPI or Assessment and Plan.  Objective  Well appearing patient in no apparent distress; mood and affect are within normal limits.  A focused examination was performed including left thigh. Relevant physical exam findings are noted in the Assessment and Plan.  L sup lat thigh Firm pink/brown papulenodule with dimple sign 2.2cm      Assessment & Plan  Neoplasm of skin L sup lat thigh  mupirocin ointment (BACTROBAN) 2 % Apply 1 application topically daily. Qd to excision site  Epidermal / dermal shaving  Lesion diameter (cm):  2.5 Informed consent: discussed and consent obtained   Timeout: patient name, date of birth, surgical site, and procedure verified   Procedure prep:  Patient was prepped and draped in usual sterile fashion Prep type:  Isopropyl alcohol Anesthesia: the lesion was anesthetized in a standard fashion   Anesthetic:  1% lidocaine w/ epinephrine 1-100,000 buffered w/ 8.4% NaHCO3 Instrument used: flexible razor blade   Hemostasis achieved with: pressure, aluminum chloride and electrodesiccation   Outcome: patient tolerated procedure well   Post-procedure details: sterile dressing applied and wound care instructions given   Dressing type: bandage and bacitracin    Specimen 1 - Surgical pathology Differential Diagnosis: D48.5 Dermatofibroma vs other  Check Margins: No Firm pink/brown papulenodule with dimple sign 2.5cm  Dermatofibroma symptomatic vs other  Start Mupirocin oint qd with dressing  changes  Return if symptoms worsen or fail to improve.  I, Othelia Pulling, RMA, am acting as scribe for Sarina Ser, MD . Documentation: I have reviewed the above documentation for accuracy and completeness, and I agree with the above.  Sarina Ser, MD

## 2021-09-30 NOTE — Patient Instructions (Signed)
Wound Care Instructions  Cleanse wound gently with soap and water once a day then pat dry with clean gauze. Apply a thing coat of Petrolatum (petroleum jelly, "Vaseline") over the wound (unless you have an allergy to this). We recommend that you use a new, sterile tube of Vaseline. Do not pick or remove scabs. Do not remove the yellow or white "healing tissue" from the base of the wound.  Cover the wound with fresh, clean, nonstick gauze and secure with paper tape. You may use Band-Aids in place of gauze and tape if the would is small enough, but would recommend trimming much of the tape off as there is often too much. Sometimes Band-Aids can irritate the skin.  You should call the office for your biopsy report after 1 week if you have not already been contacted.  If you experience any problems, such as abnormal amounts of bleeding, swelling, significant bruising, significant pain, or evidence of infection, please call the office immediately.  FOR ADULT SURGERY PATIENTS: If you need something for pain relief you may take 1 extra strength Tylenol (acetaminophen) AND 2 Ibuprofen (200mg each) together every 4 hours as needed for pain. (do not take these if you are allergic to them or if you have a reason you should not take them.) Typically, you may only need pain medication for 1 to 3 days.   If you have any questions or concerns for your doctor, please call our main line at [phone] and press option 4 to reach your doctor's medical assistant. If no one answers, please leave a voicemail as directed and we will return your call as soon as possible. Messages left after 4 pm will be answered the following business day.   You may also send us a message via MyChart. We typically respond to MyChart messages within 1-2 business days.  For prescription refills, please ask your pharmacy to contact our office. Our fax number is [phone].  If you have an urgent issue when the clinic is closed that  cannot wait until the next business day, you can page your doctor at the number below.    Please note that while we do our best to be available for urgent issues outside of office hours, we are not available 24/7.   If you have an urgent issue and are unable to reach us, you may choose to seek medical care at your doctor's office, retail clinic, urgent care center, or emergency room.  If you have a medical emergency, please immediately call 911 or go to the emergency department.  Pager Numbers  - Dr. Kowalski: [phone]  - Dr. Moye: [phone]  - Dr. Stewart: [phone]  In the event of inclement weather, please call our main line at [phone] for an update on the status of any delays or closures.  Dermatology Medication Tips: Please keep the boxes that topical medications come in in order to help keep track of the instructions about where and how to use these. Pharmacies typically print the medication instructions only on the boxes and not directly on the medication tubes.   If your medication is too expensive, please contact our office at [phone] option 4 or send us a message through MyChart.   We are unable to tell what your co-pay for medications will be in advance as this is different depending on your insurance coverage. However, we may be able to find a substitute medication at lower cost or fill out paperwork to get insurance to cover a needed   medication.   If a prior authorization is required to get your medication covered by your insurance company, please allow us 1-2 business days to complete this process.  Drug prices often vary depending on where the prescription is filled and some pharmacies may offer cheaper prices.  The website www.goodrx.com contains coupons for medications through different pharmacies. The prices here do not account for what the cost may be with help from insurance (it may be cheaper with your insurance), but the website can give you the  price if you did not use any insurance.  - You can print the associated coupon and take it with your prescription to the pharmacy.  - You may also stop by our office during regular business hours and pick up a GoodRx coupon card.  - If you need your prescription sent electronically to a different pharmacy, notify our office through  MyChart or by phone at [phone] option 4.   

## 2021-10-01 ENCOUNTER — Ambulatory Visit: Admission: RE | Admit: 2021-10-01 | Payer: Medicaid Other | Source: Ambulatory Visit

## 2021-10-02 ENCOUNTER — Encounter: Payer: Self-pay | Admitting: Family Medicine

## 2021-10-02 DIAGNOSIS — M5412 Radiculopathy, cervical region: Secondary | ICD-10-CM | POA: Diagnosis not present

## 2021-10-03 DIAGNOSIS — G4731 Primary central sleep apnea: Secondary | ICD-10-CM | POA: Diagnosis not present

## 2021-10-03 DIAGNOSIS — G4733 Obstructive sleep apnea (adult) (pediatric): Secondary | ICD-10-CM | POA: Diagnosis not present

## 2021-10-06 ENCOUNTER — Telehealth: Payer: Self-pay

## 2021-10-06 NOTE — Telephone Encounter (Signed)
Patient informed of pathology results 

## 2021-10-06 NOTE — Telephone Encounter (Signed)
-----   Message from Ralene Bathe, MD sent at 10/06/2021  4:54 PM EDT ----- Diagnosis Skin (M), L sup lat thigh PIGMENTED SEBORRHEIC KERATOSIS, EARLY AND FIBROLIPOMA  Benign keratosis associated with benign fatty mole (fibrolipoma) No further treatment unless recurs

## 2021-10-07 ENCOUNTER — Other Ambulatory Visit: Payer: Self-pay

## 2021-10-07 ENCOUNTER — Ambulatory Visit: Payer: Medicaid Other | Admitting: Family Medicine

## 2021-10-07 ENCOUNTER — Encounter: Payer: Self-pay | Admitting: Family Medicine

## 2021-10-07 VITALS — BP 132/86 | HR 91 | Temp 98.1°F | Resp 16 | Ht 71.0 in | Wt 212.0 lb

## 2021-10-07 DIAGNOSIS — I10 Essential (primary) hypertension: Secondary | ICD-10-CM | POA: Diagnosis not present

## 2021-10-07 DIAGNOSIS — E049 Nontoxic goiter, unspecified: Secondary | ICD-10-CM

## 2021-10-07 DIAGNOSIS — E440 Moderate protein-calorie malnutrition: Secondary | ICD-10-CM | POA: Diagnosis not present

## 2021-10-07 DIAGNOSIS — E538 Deficiency of other specified B group vitamins: Secondary | ICD-10-CM | POA: Diagnosis not present

## 2021-10-07 DIAGNOSIS — M6281 Muscle weakness (generalized): Secondary | ICD-10-CM

## 2021-10-07 DIAGNOSIS — E785 Hyperlipidemia, unspecified: Secondary | ICD-10-CM

## 2021-10-07 DIAGNOSIS — Z23 Encounter for immunization: Secondary | ICD-10-CM

## 2021-10-07 DIAGNOSIS — M2550 Pain in unspecified joint: Secondary | ICD-10-CM

## 2021-10-07 DIAGNOSIS — R809 Proteinuria, unspecified: Secondary | ICD-10-CM

## 2021-10-07 DIAGNOSIS — M5412 Radiculopathy, cervical region: Secondary | ICD-10-CM

## 2021-10-07 DIAGNOSIS — R569 Unspecified convulsions: Secondary | ICD-10-CM | POA: Diagnosis not present

## 2021-10-07 DIAGNOSIS — E1129 Type 2 diabetes mellitus with other diabetic kidney complication: Secondary | ICD-10-CM | POA: Diagnosis not present

## 2021-10-07 DIAGNOSIS — K1379 Other lesions of oral mucosa: Secondary | ICD-10-CM

## 2021-10-07 DIAGNOSIS — G4731 Primary central sleep apnea: Secondary | ICD-10-CM

## 2021-10-07 DIAGNOSIS — I2510 Atherosclerotic heart disease of native coronary artery without angina pectoris: Secondary | ICD-10-CM

## 2021-10-07 DIAGNOSIS — I152 Hypertension secondary to endocrine disorders: Secondary | ICD-10-CM

## 2021-10-07 DIAGNOSIS — E1169 Type 2 diabetes mellitus with other specified complication: Secondary | ICD-10-CM

## 2021-10-07 DIAGNOSIS — I25118 Atherosclerotic heart disease of native coronary artery with other forms of angina pectoris: Secondary | ICD-10-CM | POA: Diagnosis not present

## 2021-10-07 DIAGNOSIS — I2583 Coronary atherosclerosis due to lipid rich plaque: Secondary | ICD-10-CM

## 2021-10-07 DIAGNOSIS — E1159 Type 2 diabetes mellitus with other circulatory complications: Secondary | ICD-10-CM

## 2021-10-07 MED ORDER — CYANOCOBALAMIN 1000 MCG/ML IJ SOLN
1000.0000 ug | Freq: Once | INTRAMUSCULAR | Status: AC
  Administered 2021-10-07: 1000 ug via INTRAMUSCULAR

## 2021-10-07 NOTE — Progress Notes (Signed)
Name: [name] [name]   MRN: [id number]    DOB: 1961/12/20   Date:10/07/2021       Progress Note  Subjective  Chief Complaint  Follow Up  HPI  DMII: A1C has been well controlled now 5.4 %  He is off Lantus, Trajenta, Metformin , we also stopped [place] in March 22 , he is currently on Trulicity down to 4.09 mg weekly and continues to lose weight, down another 6 lbs in the past 3 months.  No polyphagia, polydipsia or polyuria. He has dyslipidemia and HTN . He is not very physically active due to SOB and increase in heart rate during activity. Discussed stopping Trulicity today but he is afraid it will cause his glucose to go up, he states glucose 90-100, explained he can try stopping and continue his healthy diet    Constipation: doing well on Amitiza, down to once daily pill  , Bristol scale of 4 , no longer straining, has increased fiber in his diet, he has bowel movements now at least once a day but no more than 3 times per day.   He is under the care of Dr. Marius Ditch   Dysphagia: still has difficulty swallowing had a esophageal stretching 08/2021 but still has symptoms, also takes PPI    HTN/Angina : taking medication, bp has been under control at home highest is 116-130'/ 63-85.  He is on ARB and Ranexa, off Imdur due to hypotension, having difficulty walking due to SOB and associated dizziness.   SOB: going on since Fall 2021) he has been evaluated by pulmonologist and cardiologist and no answers yet, cardiac cath unremarkable, also negative  CT chest  Cardiac cath done 07/2021    Ost LAD to Mid LAD lesion is 10% stenosed.   Mid LAD lesion is 30% stenosed   Hyperlipidemia: compliant with medication, reviewed last labs , LDL at goal it was 68 , he is on Zetia only since we thought statin therapy was the cause of his pain and weakness  Seizure like activity  : he is now seeing Dr. Melrose Nakayama, still has tremors when standing up, but no seziure like activity lately, he has episodes of dizziness  and headaches. . He has a history of neck surgery and is on Lyrica .  Low B12: he started B12 injections in June 2022, we will recheck labs today    OSA: he is wearing CPAP every night since he got a new mask  HCT was high on his last visit, he states compliant with medication  Cervical left radiculopathy: seen by Dr. Jobe Igo and PT advised since his SOB and recent health issues makes him a poor candidate for surgery at this time  Imaging: MRI C spine 08/26/2021 IMPRESSION:  1. Multilevel spondylosis of the cervical spine as described.  2. Moderate foraminal narrowing bilaterally at C2-3, right greater  than left.  3. Moderate right mild left foraminal narrowing at C3-4.  4. Moderate foraminal narrowing bilaterally at C5-6 and C6-7 is  worse on the right.  5. Mild central canal narrowing at C6-7.    Malnutrition: BMI is now down to 30, he has lost over 30 lbs in the past year, 6 more pounds since his last visit with me in July.  We went down on Trulicity to 8.11 and his weight is not going up   Enlarged thyroid: CT done 03/[facility]21 at [facility], showed goiter and compression of trachea , we will check [place] and TSH today  Arthralgia/muscle weakness and mouth  sores: going on for months, he has lost weight, getting progressively worse, he used to be muscular and now unable to even lift groceries bags, his mouth has sores and feels on fire, he states in the mornings it takes about 30 minutes to be able to move around, wakes up feeling very stiff.   Patient Active Problem List   Diagnosis Date Noted   Loss of weight    Dysphagia    SOB (shortness of breath)    Asthma 05/14/2021   Multiple thyroid nodules 05/14/2021   Localized, primary osteoarthritis of hand 09/28/2019   Seizure ([place]) 02/22/2018   Mild cognitive impairment 11/17/2017   Radiculopathy of cervical region 11/17/2017   Stenosis of carotid artery 09/08/2017   Cervical stenosis of spine 09/08/2017   B12 deficiency 06/28/2017   Headache  disorder 02/15/2017   Memory loss or impairment 02/15/2017   Coronary artery disease involving native coronary artery of native heart    Radiculitis of left cervical region 03/05/2016   Diabetic neuropathy associated with type 2 diabetes mellitus ([place]) 12/05/2015   Patellar subluxation 06/05/2015   Allergic rhinitis, seasonal 06/05/2015   Chronic constipation 06/05/2015   Chronic lower back pain 06/05/2015   Morbid obesity due to excess calories ([place]) 06/05/2015   Vitamin D deficiency 06/05/2015   Central sleep apnea 06/05/2015   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   Nerve root pain 06/05/2015   Acquired polycythemia 06/05/2015   Anterior knee pain 16/09/9603   Dysmetabolic syndrome 54/08/8118   Failure of erection 06/05/2015   Gastro-esophageal reflux disease without esophagitis 06/05/2015   Neuropathy 06/05/2015   CAD (coronary artery disease)    Angina, class III ([place]) 05/23/2015   Essential hypertension 05/23/2015   Hyperlipidemia 05/23/2015   Inguinal hernia without mention of obstruction or gangrene, recurrent unilateral or unspecified 03/24/2013    Past Surgical History:  Procedure Laterality Date   BACK SURGERY  12/2008   X2-LUMBAR   CARDIAC CATHETERIZATION N/A 05/30/2015   Procedure: Left Heart Cath;  Surgeon: Wellington Hampshire, MD;  Location: [place] [facility];  Service: Cardiovascular;  Laterality: N/A;   CARDIAC CATHETERIZATION N/A 04/16/2016   Procedure: Left Heart Cath and Coronary Angiography;  Surgeon: Wellington Hampshire, MD;  Location: [place] [facility];  Service: Cardiovascular;  Laterality: N/A;   COLONOSCOPY  2014   Dr. [name]   COLONOSCOPY WITH PROPOFOL N/A 12/25/2016   Procedure: COLONOSCOPY WITH PROPOFOL;  Surgeon: Jonathon Bellows, MD;  Location: [facility] ENDOSCOPY;  Service: Endoscopy;  Laterality: N/A;   COLONOSCOPY WITH PROPOFOL N/A 01/29/2017   Procedure: COLONOSCOPY WITH PROPOFOL;  Surgeon: Jonathon Bellows, MD;  Location: [facility] ENDOSCOPY;  Service: Endoscopy;  Laterality:  N/A;   COLONOSCOPY WITH PROPOFOL N/A 04/11/2020   Procedure: COLONOSCOPY WITH PROPOFOL;  Surgeon: Jonathon Bellows, MD;  Location: [facility] ENDOSCOPY;  Service: Gastroenterology;  Laterality: N/A;   ESOPHAGOGASTRODUODENOSCOPY (EGD) WITH PROPOFOL N/A 01/26/2019   Procedure: ESOPHAGOGASTRODUODENOSCOPY (EGD) WITH PROPOFOL;  Surgeon: Lin Landsman, MD;  Location: [facility] ENDOSCOPY;  Service: Gastroenterology;  Laterality: N/A;   ESOPHAGOGASTRODUODENOSCOPY (EGD) WITH PROPOFOL N/A 09/10/2021   Procedure: ESOPHAGOGASTRODUODENOSCOPY (EGD) WITH PROPOFOL;  Surgeon: Lin Landsman, MD;  Location: [facility] ENDOSCOPY;  Service: Gastroenterology;  Laterality: N/A;   EVALUATION UNDER ANESTHESIA WITH HEMORRHOIDECTOMY N/A 02/24/2017   Procedure: EXAM UNDER ANESTHESIA WITH POSSIBLE EXCISION OF INTERNAL HEMORRHOIDS;  Surgeon: Olean Ree, MD;  Location: [facility] ORS;  Service: General;  Laterality: N/A;   FISSURECTOMY  02/24/2017   Procedure: FISSURECTOMY;  Surgeon: Olean Ree, MD;  Location: [facility] ORS;  Service: General;;   HAND SURGERY Left 2020   HERNIA REPAIR Right 1991   INGUINAL HERNIA REPAIR Right 2012   Dr Teryl Lucy HERNIA REPAIR Right 2014   Dr [name]   INTRAVASCULAR PRESSURE WIRE/FFR STUDY N/A 07/30/2021   Procedure: INTRAVASCULAR PRESSURE WIRE/FFR STUDY;  Surgeon: Wellington Hampshire, MD;  Location: [place] [facility];  Service: Cardiovascular;  Laterality: N/A;   LEFT HEART CATH AND CORONARY ANGIOGRAPHY Left 08/05/2020   Procedure: LEFT HEART CATH AND CORONARY ANGIOGRAPHY poss PCI;  Surgeon: Wellington Hampshire, MD;  Location: [place] [facility];  Service: Cardiovascular;  Laterality: Left;   RIGHT/LEFT HEART CATH AND CORONARY ANGIOGRAPHY N/A 07/30/2021   Procedure: RIGHT/LEFT HEART CATH AND CORONARY ANGIOGRAPHY;  Surgeon: Wellington Hampshire, MD;  Location: [place] [facility];  Service: Cardiovascular;  Laterality: N/A;    Family History  Problem Relation Age of Onset   Heart Problems Mother    Heart  Problems Father 60       myocardial infarction   Mental illness Brother    Testicular cancer Paternal Uncle    Other Maternal Aunt        COVID   Breast cancer Cousin     Social History   Tobacco Use   Smoking status: Former    Types: Cigars    Start date: 12/28/2002    Quit date: 06/04/2005    Years since quitting: 16.3   Smokeless tobacco: Never   Tobacco comments:    quit 2006-smoked 1 cigar occ  Substance Use Topics   Alcohol use: Yes    Alcohol/week: 1.0 standard drink    Types: 1 Glasses of wine per week    Comment: RARE BEER     Current Outpatient Medications:    ACCU-CHEK GUIDE test strip, USE AS DIRECTED TO TEST BLOOD SUGAR THREE TIMES DAILY AND AS NEEDED, Disp: 100 strip, Rfl: 1   Accu-Chek Softclix Lancets lancets, TEST TWICE DAILY, Disp: 100 each, Rfl: 3   aspirin 81 MG tablet, Take 81 mg by mouth daily., Disp: , Rfl:    blood glucose meter kit and supplies, 1 each by Other route in the morning and at bedtime. Dispense based on patient and insurance preference. Use up to two  times daily. (FOR ICD-10 E10.9, E11.9)., Disp: 1 each, Rfl: 0   BREO ELLIPTA 100-25 MCG/INH AEPB, Inhale 1 puff into the lungs daily., Disp: , Rfl:    carvedilol (COREG) 6.25 MG tablet, Take 1 tablet (6.25 mg total) by mouth 2 (two) times daily., Disp: 180 tablet, Rfl: 1   cetirizine (ZYRTEC) 10 MG tablet, Take 10 mg by mouth daily as needed for allergies. , Disp: , Rfl: 1   Dulaglutide (TRULICITY) 5.36 UY/4.0HK SOPN, Inject 0.75 mg into the skin once a week. Monday, Disp: , Rfl:    ezetimibe (ZETIA) 10 MG tablet, Take 1 tablet (10 mg total) by mouth daily., Disp: 90 tablet, Rfl: 1   Ipratropium-Albuterol (COMBIVENT) 20-100 MCG/ACT AERS respimat, Inhale 1 puff into the lungs 2 (two) times daily., Disp: , Rfl:    ketoconazole (NIZORAL) 2 % shampoo, Apply 1 application topically 3 (three) times a week. Wash scalp/body 3 times a week as needed for alres, let sit 5 minutes before rinsing off, avoid  eyes. May use once monthly for maintenance., Disp: 120 mL, Rfl: 11   losartan-hydrochlorothiazide (HYZAAR) 50-12.5 MG tablet, Take 1 tablet by mouth daily., Disp: , Rfl:    lubiprostone (AMITIZA)  24 MCG capsule, Take 1 capsule (24 mcg total) by mouth 2 (two) times daily with a meal., Disp: , Rfl:    mupirocin ointment (BACTROBAN) 2 %, Apply 1 application topically daily. Qd to excision site, Disp: 22 g, Rfl: 1   nitroGLYCERIN (NITROSTAT) 0.4 MG SL tablet, Place 1 tablet (0.4 mg total) under the tongue every 5 (five) minutes as needed for chest pain., Disp: , Rfl:    pantoprazole (PROTONIX) 40 MG tablet, Take 40 mg by mouth daily., Disp: , Rfl:    pregabalin (LYRICA) 50 MG capsule, Take 1 capsule (50 mg total) by mouth 3 (three) times daily., Disp: 270 capsule, Rfl: 1   ranolazine (RANEXA) 1000 MG SR tablet, Take 1 tablet (1,000 mg total) by mouth 2 (two) times daily., Disp: 180 tablet, Rfl: 3   sildenafil (REVATIO) 20 MG tablet, Take by mouth., Disp: , Rfl:    omeprazole (PRILOSEC) 40 MG capsule, Take 1 capsule (40 mg total) by mouth 2 (two) times daily before a meal., Disp: 60 capsule, Rfl: 2  Allergies  Allergen Reactions   Gabapentin Other (See Comments)    Groggy-Mood Changes   Latex Rash   Penicillins Hives and Rash    Has patient had a PCN reaction causing immediate rash, facial/tongue/throat swelling, SOB or lightheadedness with hypotension: Yes Has patient had a PCN reaction causing severe rash involving mucus membranes or skin necrosis: No Has patient had a PCN reaction that required hospitalization No Has patient had a PCN reaction occurring within the last 10 years: No If all of the above answers are "NO", then may proceed with Cephalosporin use.     I personally reviewed active problem list, medication list, allergies, family history, social history, health maintenance with the patient/caregiver today.   ROS  Ten systems reviewed and is negative except as mentioned in HPI    Objective  Vitals:   10/07/21 1412  BP: 132/86  Pulse: 91  Resp: 16  Temp: 98.1 F (36.7 C)  SpO2: 95%  Weight: 212 lb (96.2 kg)  Height: '5\' 11"'  (1.803 m)    Body mass index is 29.57 kg/m.  Physical Exam  Constitutional: Patient appears well-developed and well-nourished.  No distress.  HEENT: head atraumatic, normocephalic, pupils equal and reactive to light, neck supple, Cardiovascular: Normal rate, regular rhythm and normal heart sounds.  No murmur heard. No BLE edema. Pulmonary/Chest: Effort normal and breath sounds normal. No respiratory distress. Abdominal: Soft.  There is no tenderness. Psychiatric: Patient has a normal mood and affect. behavior is normal. Judgment and thought content normal.  Muscular: he has generalized atrophy but symmetrical, strength still 5/5    Recent Results (from the past 2160 hour(s))  POCT HgB A1C     Status: None   Collection Time: 07/21/21  8:13 AM  Result Value Ref Range   Hemoglobin A1C 5.3 4.0 - 5.6 %   HbA1c POC (<> result, manual entry)     HbA1c, POC (prediabetic range)     HbA1c, POC (controlled diabetic range)    Lipid panel     Status: None   Collection Time: 07/21/21  8:52 AM  Result Value Ref Range   Cholesterol 129 <200 mg/dL   HDL 56 > OR = 40 mg/dL   Triglycerides 79 <150 mg/dL   LDL Cholesterol (Calc) 57 mg/dL (calc)    Comment: Reference range: <100 . Desirable range <100 mg/dL for primary prevention;   <70 mg/dL for patients with CHD or diabetic patients  with >  or = 2 CHD risk factors. Marland Kitchen LDL-C is now calculated using the Martin-Hopkins  calculation, which is a validated novel method providing  better accuracy than the Friedewald equation in the  estimation of LDL-C.  Cresenciano Genre et al. Annamaria Helling. 3833;383(29): 2061-2068  (http://education.[facility].com/faq/FAQ164)    Total CHOL/HDL Ratio 2.3 <5.0 (calc)   Non-HDL Cholesterol (Calc) 73 <130 mg/dL (calc)    Comment: For patients with diabetes plus 1 major  ASCVD risk  factor, treating to a non-HDL-C goal of <100 mg/dL  (LDL-C of <70 mg/dL) is considered a therapeutic  option.   HIV Antibody (routine testing w rflx)     Status: None   Collection Time: 07/21/21  8:52 AM  Result Value Ref Range   HIV 1&2 Ab, 4th Generation NON-REACTIVE NON-REACTIVE    Comment: HIV-1 antigen and HIV-1/HIV-2 antibodies were not detected. There is no laboratory evidence of HIV infection. Marland Kitchen PLEASE NOTE: This information has been disclosed to you from records whose confidentiality may be protected by state law.  If your state requires such protection, then the state law prohibits you from making any further disclosure of the information without the specific written consent of the person to whom it pertains, or as otherwise permitted by law. A general authorization for the release of medical or other information is NOT sufficient for this purpose. . For additional information please refer to http://education.[facility].com/faq/FAQ106 (This link is being provided for informational/ educational purposes only.) . Marland Kitchen The performance of this assay has not been clinically validated in patients less than 66 years old. .   C-reactive protein     Status: None   Collection Time: 07/21/21  8:52 AM  Result Value Ref Range   CRP 0.6 <8.0 mg/L  COMPLETE METABOLIC PANEL WITH GFR     Status: Abnormal   Collection Time: 07/21/21  8:52 AM  Result Value Ref Range   Glucose, Bld 90 65 - 99 mg/dL    Comment: .            Fasting reference interval .    BUN 12 7 - 25 mg/dL   Creat 0.85 0.70 - 1.30 mg/dL   eGFR 101 > OR = 60 mL/min/1.28m    Comment: The eGFR is based on the CKD-EPI 2021 equation. To calculate  the new eGFR from a previous Creatinine or Cystatin C result, go to https://www.kidney.org/professionals/ kdoqi/gfr%5Fcalculator    BUN/Creatinine Ratio NOT APPLICABLE 6 - 22 (calc)   Sodium 140 135 - 146 mmol/L   Potassium 4.4 3.5 - 5.3 mmol/L    Chloride 101 98 - 110 mmol/L   CO2 28 20 - 32 mmol/L   Calcium 9.8 8.6 - 10.3 mg/dL   Total Protein 7.5 6.1 - 8.1 g/dL   Albumin 4.5 3.6 - 5.1 g/dL   Globulin 3.0 1.9 - 3.7 g/dL (calc)   AG Ratio 1.5 1.0 - 2.5 (calc)   Total Bilirubin 0.7 0.2 - 1.2 mg/dL   Alkaline phosphatase (APISO) 67 35 - 144 U/L   AST 37 (H) 10 - 35 U/L   ALT 30 9 - 46 U/L  CBC with Differential/Platelet     Status: Abnormal   Collection Time: 07/21/21  8:52 AM  Result Value Ref Range   WBC 8.0 3.8 - 10.8 Thousand/uL   RBC 5.61 4.20 - 5.80 Million/uL   Hemoglobin 17.7 (H) 13.2 - 17.1 g/dL   HCT 53.4 (H) 38.5 - 50.0 %   MCV 95.2 80.0 - 100.0 fL   MCH  31.6 27.0 - 33.0 pg   MCHC 33.1 32.0 - 36.0 g/dL   RDW 12.8 11.0 - 15.0 %   Platelets 226 140 - 400 Thousand/uL   MPV 9.4 7.5 - 12.5 fL   Neutro Abs 4,360 1,500 - 7,800 cells/uL   Lymphs Abs 2,880 850 - 3,900 cells/uL   Absolute Monocytes 672 200 - 950 cells/uL   Eosinophils Absolute 40 15 - 500 cells/uL   Basophils Absolute 48 0 - 200 cells/uL   Neutrophils Relative % 54.5 %   Total Lymphocyte 36.0 %   Monocytes Relative 8.4 %   Eosinophils Relative 0.5 %   Basophils Relative 0.6 %  PSA     Status: None   Collection Time: 07/21/21  8:52 AM  Result Value Ref Range   PSA 1.76 < OR = 4.00 ng/mL    Comment: The total PSA value from this assay system is  standardized against the WHO standard. The test  result will be approximately 20% lower when compared  to the equimolar-standardized total PSA (Beckman  Coulter). Comparison of serial PSA results should be  interpreted with this fact in mind. . This test was performed using the Siemens  chemiluminescent method. Values obtained from  different assay methods cannot be used interchangeably. PSA levels, regardless of value, should not be interpreted as absolute evidence of the presence or absence of disease.   POC Hemoccult Bld/Stl (3-Cd Home Screen)     Status: None   Collection Time: 07/22/21  9:55 AM   Result Value Ref Range   Card #1 Date     Fecal Occult Blood, POC Negative Negative   Card #2 Date     Card #2 Fecal Occult Blod, POC Negative    Card #3 Date     Card #3 Fecal Occult Blood, POC Negative   Microalbumin / creatinine urine ratio     Status: Abnormal   Collection Time: 07/22/21 10:05 AM  Result Value Ref Range   Creatinine, Urine 57 20 - 320 mg/dL   Microalb, Ur 1.8 mg/dL    Comment: Reference Range Not established    Microalb Creat Ratio 32 (H) <30 mcg/mg creat    Comment: . The ADA defines abnormalities in albumin excretion as follows: Marland Kitchen Albuminuria Category        Result (mcg/mg creatinine) . Normal to Mildly increased   <30 Moderately increased         30-299  Severely increased           > OR = 300 . The ADA recommends that at least two of three specimens collected within a 3-6 month period be abnormal before considering a patient to be within a diagnostic category.   Glucose, capillary     Status: None   Collection Time: 07/30/21 11:45 AM  Result Value Ref Range   Glucose-Capillary 85 70 - 99 mg/dL    Comment: Glucose reference range applies only to samples taken after fasting for at least 8 hours.   Comment 1 Notify RN   I-STAT 7, (LYTES, BLD GAS, ICA, H+H)     Status: None   Collection Time: 07/30/21  3:50 PM  Result Value Ref Range   pH, Arterial 7.414 7.350 - 7.450   pCO2 arterial 37.4 32.0 - 48.0 mmHg   pO2, Arterial 98 83.0 - 108.0 mmHg   Bicarbonate 24.0 20.0 - 28.0 mmol/L   TCO2 25 22 - 32 mmol/L   O2 Saturation 98.0 %   Acid-Base Excess 0.0  0.0 - 2.0 mmol/L   Sodium 139 135 - 145 mmol/L   Potassium 3.9 3.5 - 5.1 mmol/L   Calcium, Ion 1.22 1.15 - 1.40 mmol/L   HCT 48.0 39.0 - 52.0 %   Hemoglobin 16.3 13.0 - 17.0 g/dL   Sample type ARTERIAL   POCT I-Stat EG7     Status: Abnormal   Collection Time: 07/30/21  3:51 PM  Result Value Ref Range   pH, Ven 7.393 7.250 - 7.430   pCO2, Ven 38.0 (L) 44.0 - 60.0 mmHg   pO2, Ven 43.0 32.0 -  45.0 mmHg   Bicarbonate 23.2 20.0 - 28.0 mmol/L   TCO2 24 22 - 32 mmol/L   O2 Saturation 79.0 %   Acid-base deficit 1.0 0.0 - 2.0 mmol/L   Sodium 139 135 - 145 mmol/L   Potassium 3.8 3.5 - 5.1 mmol/L   Calcium, Ion 1.21 1.15 - 1.40 mmol/L   HCT 48.0 39.0 - 52.0 %   Hemoglobin 16.3 13.0 - 17.0 g/dL   Sample type MIXED VENOUS SAMPLE   POCT Activated clotting time     Status: None   Collection Time: 07/30/21  4:16 PM  Result Value Ref Range   Activated Clotting Time 225 seconds  Glucose, capillary     Status: None   Collection Time: 09/10/21  7:46 AM  Result Value Ref Range   Glucose-Capillary 94 70 - 99 mg/dL    Comment: Glucose reference range applies only to samples taken after fasting for at least 8 hours.   Comment 1 IN EPIC   Surgical pathology     Status: None   Collection Time: 09/10/21  8:45 AM  Result Value Ref Range   SURGICAL PATHOLOGY      SURGICAL PATHOLOGY CASE: [id number] PATIENT: Ander Gaster Surgical Pathology Report     Specimen Submitted: A. Esophagus; cbx  Clinical History: Weight loss R63.4 and dysphagia R13.10     DIAGNOSIS: A.  ESOPHAGUS; COLD BIOPSY: - STRATIFIED SQUAMOUS EPITHELIUM WITHOUT EOSINOPHILS, NEUTROPHILS, OR REACTIVE CHANGES. - NEGATIVE FOR DYSPLASIA AND MALIGNANCY.  GROSS DESCRIPTION: A. Labeled: Esophageal cbx dysphagia Received: Formalin Collection time: 8:45 AM on 09/10/2021 Placed into formalin time: 8:45 AM on 09/10/2021 Tissue fragment(s): Multiple Size: Aggregate, 0.6 x 0.4 x 0.1 cm Description: Pink, translucent soft tissue fragments Entirely submitted in 1 cassette.  [facility] 09/10/2021  Final Diagnosis performed by Bryan Lemma, MD.   Electronically signed 09/11/2021 7:26:34PM The electronic signature indicates that the named Attending Pathologist has evaluated the specimen Technical component performed at [facility], [address] [place], [place] [address] Lab:  [phone] Dir: Rush Farmer, MD, MMM  Professional  component performed at [facility], [facility], [place], [place], [place] [address] Lab: [phone] Dir: Dellia Nims. Rubinas, MD       PHQ2/9: Depression screen [facility] 2/9 10/07/2021 07/21/2021 03/26/2021 02/28/2021 12/19/2020  Decreased Interest 0 0 0 0 0  Down, Depressed, Hopeless 0 0 0 0 0  PHQ - 2 Score 0 0 0 0 0  Altered sleeping - - - - 0  Tired, decreased energy - - - - 3  Change in appetite - - - - 0  Feeling bad or failure about yourself  - - - - 0  Trouble concentrating - - - - 0  Moving slowly or fidgety/restless - - - - 0  Suicidal thoughts - - - - 0  PHQ-9 Score - - - - 3  Difficult doing work/chores - - - - -  Some recent data might be hidden    phq 9 is negative   Fall Risk: Fall Risk  10/07/2021 07/21/2021 03/26/2021 02/28/2021 12/19/2020  Falls in the past year? 1 0 0 0 0  Number falls in past yr: 1 0 0 0 0  Injury with Fall? 1 0 0 - 0  Comment - - - - -  Risk for fall due to : History of fall(s) - - - -  Follow up Falls prevention discussed - - Falls prevention discussed -      Functional Status Survey: Is the patient deaf or have difficulty hearing?: No Does the patient have difficulty seeing, even when wearing glasses/contacts?: No Does the patient have difficulty concentrating, remembering, or making decisions?: No Does the patient have difficulty walking or climbing stairs?: Yes Does the patient have difficulty dressing or bathing?: Yes Does the patient have difficulty doing errands alone such as visiting a doctor's office or shopping?: Yes    Assessment & Plan  1. B12 deficiency  - cyanocobalamin ((VITAMIN B-12)) injection 1,000 mcg - Vitamin B12  2. Influenza vaccine needed  - Flu Vaccine QUAD 6+ mos PF IM (Fluarix Quad PF)  3. Mouth sores  - Ambulatory referral to Rheumatology - Sjogrens syndrome-A extractable nuclear antibody - Sjogrens syndrome-B extractable nuclear antibody - ANA,IFA RA Diag Pnl w/rflx  Tit/Patn  4. Arthralgia, unspecified joint  - Ambulatory referral to Rheumatology - Sjogrens syndrome-A extractable nuclear antibody - Sjogrens syndrome-B extractable nuclear antibody - ANA,IFA RA Diag Pnl w/rflx Tit/Patn  5. Muscle weakness  - Ambulatory referral to Rheumatology - Sjogrens syndrome-A extractable nuclear antibody - Sjogrens syndrome-B extractable nuclear antibody - ANA,IFA RA Diag Pnl w/rflx Tit/Patn  6. Moderate protein-calorie malnutrition ([place])   7. Coronary artery disease of native artery of native heart with stable angina pectoris ([place])   8. Controlled type 2 diabetes mellitus with microalbuminuria, without long-term current use of insulin (HCC)   9. Cervical radiculitis  - Vitamin B12  10. Essential hypertension   11. Dyslipidemia associated with type 2 diabetes mellitus ([place])   12. Hypertension associated with diabetes ([place])   13. Seizure ([place])   14. Coronary artery disease due to lipid rich plaque   15. Primary central sleep apnea   16. Goiter  - US THYROID; Future - TSH

## 2021-10-08 ENCOUNTER — Encounter: Payer: Self-pay | Admitting: Family Medicine

## 2021-10-09 LAB — TSH: TSH: 0.9 mIU/L (ref 0.40–4.50)

## 2021-10-09 LAB — ANA,IFA RA DIAG PNL W/RFLX TIT/PATN
Anti Nuclear Antibody (ANA): NEGATIVE
Cyclic Citrullin Peptide Ab: <16 UNITS
Rheumatoid fact SerPl-aCnc: <14 IU/mL (ref <14)

## 2021-10-09 LAB — SJOGRENS SYNDROME-B EXTRACTABLE NUCLEAR ANTIBODY: SSB (La) (ENA) Antibody, IgG: <1 AI

## 2021-10-09 LAB — SJOGRENS SYNDROME-A EXTRACTABLE NUCLEAR ANTIBODY: SSA (Ro) (ENA) Antibody, IgG: <1 AI

## 2021-10-09 LAB — CK: Total CK: 398 U/L — ABNORMAL HIGH (ref 44–196)

## 2021-10-09 LAB — VITAMIN B12: Vitamin B-12: >2000 pg/mL — ABNORMAL HIGH (ref 200–1100)

## 2021-10-10 ENCOUNTER — Encounter: Payer: Self-pay | Admitting: Family Medicine

## 2021-10-17 ENCOUNTER — Other Ambulatory Visit: Payer: Self-pay | Admitting: Family Medicine

## 2021-10-22 ENCOUNTER — Other Ambulatory Visit: Payer: Self-pay

## 2021-10-22 ENCOUNTER — Ambulatory Visit
Admission: RE | Admit: 2021-10-22 | Discharge: 2021-10-22 | Disposition: A | Discharge status: Home / Self Care | Payer: Medicaid Other | Source: Ambulatory Visit | Attending: Family Medicine | Admitting: Family Medicine

## 2021-10-22 DIAGNOSIS — E049 Nontoxic goiter, unspecified: Secondary | ICD-10-CM | POA: Insufficient documentation

## 2021-10-23 ENCOUNTER — Ambulatory Visit: Payer: Medicaid Other | Admitting: Family Medicine

## 2021-10-23 ENCOUNTER — Encounter: Payer: Self-pay | Admitting: Family Medicine

## 2021-10-27 ENCOUNTER — Ambulatory Visit
Admission: RE | Admit: 2021-10-27 | Discharge: 2021-10-27 | Disposition: A | Discharge status: Home / Self Care | Payer: Medicaid Other | Source: Ambulatory Visit | Attending: Gastroenterology | Admitting: Gastroenterology

## 2021-10-27 ENCOUNTER — Other Ambulatory Visit: Payer: Self-pay

## 2021-10-27 DIAGNOSIS — R911 Solitary pulmonary nodule: Secondary | ICD-10-CM | POA: Diagnosis not present

## 2021-10-27 DIAGNOSIS — R0602 Shortness of breath: Secondary | ICD-10-CM | POA: Diagnosis not present

## 2021-10-27 DIAGNOSIS — R634 Abnormal weight loss: Secondary | ICD-10-CM | POA: Diagnosis not present

## 2021-10-27 LAB — POCT I-STAT CREATININE: Creatinine, Ser: 0.8 mg/dL (ref 0.61–1.24)

## 2021-10-27 MED ORDER — IOHEXOL 300 MG/ML  SOLN
75.0000 mL | Freq: Once | INTRAMUSCULAR | Status: AC | PRN
  Administered 2021-10-27: 75 mL via INTRAVENOUS

## 2021-10-28 ENCOUNTER — Encounter: Payer: Self-pay | Admitting: Gastroenterology

## 2021-10-28 DIAGNOSIS — R112 Nausea with vomiting, unspecified: Secondary | ICD-10-CM

## 2021-10-29 ENCOUNTER — Telehealth: Payer: Self-pay

## 2021-10-29 NOTE — Telephone Encounter (Signed)
Called and got gastric emptying  study scheduled for 11/27/2021 at 8:00 arrival time 7:45 npo 4 hours prior no gi med 4 hours prior  And have the ct scan scheduled for 11/26/2021 At 8:00 arrival time 7:45 and npo 4 hours prior and no gi med 4 hours prior patient agrees

## 2021-11-09 NOTE — Progress Notes (Signed)
Office Visit Note  Patient: [name] [name]             Date of Birth: 01/21/1961           MRN: [id number]             PCP: Steele Sizer, MD Referring: Steele Sizer, MD Visit Date: 11/10/2021  Subjective:   History of Present Illness: OTHMAR RINGER is a 60 y.o. male here with muscle pains, mouth sores, shortness of breath and unintentional weight loss.  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  However really in about the past 2 years he has been noticing progressive unintentional weight loss and decreased exertion tolerance and strength.  He was working on increasing walking exercise for obesity and got up to about 2 miles at a time but subsequently has experienced progressive weakening and limited ability to withstand exertion.  This is accompanied by severe shortness of breath and several syncopal episodes and falls.  He has become unable to sit upright unassisted for long periods of time or to rise from seated position without use of his upper extremities.  At this point about 60 pounds of weight loss and still going.  He has joint and muscle pains in multiple areas his worst problem right now is extremely painful muscle knots and sensitivity in the bilateral shoulders with radiation down his arms with any pressure. He feels like his neck ad shoulder muscles are too weak to support his head at times.  He is on Lyrica at this time.  He does not tolerate steroid medications he has previously caused a lot of side effects when he was exposed to them related to sports type injuries in the past so none tried for this condition. He has seen several specialist for these problems evaluated by gastroenterology for dysphagia with a unremarkable EGD.  He has seen pulmonology CT imaging of the chest without significant lung disease and exertional testing  did not show severe PFT abnormalities consistent with symptoms.  He has seen [facility] for his cervical spine disease findings thought to be consistent with nerve damage in multiple areas was referred to physical therapy but cannot do much work with them since he cannot tolerate the exertion for even range of motion exercises.  Lab testing mostly unremarkable except he now has a moderately elevated CK of 398 which is new compared to labs checked earlier this year.   Labs reviewed 09/2021 CK 398 ANA neg RF neg CCP neg TSH wnl B12 > 2000  10/27/21 CT Chest w Contrast IMPRESSION: 1. No acute cardiopulmonary abnormalities. No mass or adenopathy identified. 2. Coronary artery calcifications. 3. Morphologic features of the liver which may be seen in early cirrhosis.   Activities of Daily Living:  Patient reports morning stiffness for 30-45 minutes.   Patient Reports nocturnal pain.  Difficulty dressing/grooming: Reports Difficulty climbing stairs: Reports Difficulty getting out of chair: Denies Difficulty using hands for taps, buttons, cutlery, and/or writing: Reports  Review of Systems  Constitutional:  Positive for fatigue and weight loss.  HENT:  Positive for mouth sores, trouble swallowing, trouble swallowing and mouth dryness. Negative for nose dryness.   Eyes:  Negative for pain, itching and dryness.  Respiratory:  Positive for shortness of breath and difficulty breathing.   Cardiovascular:  Positive for chest pain and palpitations.  Gastrointestinal:  Positive  for constipation. Negative for blood in stool and diarrhea.  Endocrine: Positive for increased urination.  Genitourinary:  Negative for difficulty urinating.  Musculoskeletal:  Positive for joint pain, joint pain, myalgias, muscle weakness, morning stiffness, muscle tenderness and myalgias. Negative for joint swelling.  Skin:  Negative for color change, rash and redness.  Allergic/Immunologic: Negative for  susceptible to infections.  Neurological:  Positive for dizziness, numbness, headaches, memory loss and weakness.  Hematological:  Negative for bruising/bleeding tendency.  Psychiatric/Behavioral:  Negative for confusion.    PMFS History:  Patient Active Problem List   Diagnosis Date Noted   Elevated CK 11/10/2021   Muscle atrophy of lower extremity 11/10/2021   Pica 11/10/2021   Loss of weight    Dysphagia    SOB (shortness of breath)    Asthma 05/14/2021   Multiple thyroid nodules 05/14/2021   Localized, primary osteoarthritis of hand 09/28/2019   Seizure ([place]) 02/22/2018   Mild cognitive impairment 11/17/2017   Radiculopathy of cervical region 11/17/2017   Stenosis of carotid artery 09/08/2017   Cervical stenosis of spine 09/08/2017   Headache disorder 02/15/2017   Memory loss or impairment 02/15/2017   Coronary artery disease involving native coronary artery of native heart    Radiculitis of left cervical region 03/05/2016   Diabetic neuropathy associated with type 2 diabetes mellitus ([place]) 12/05/2015   Patellar subluxation 06/05/2015   Allergic rhinitis, seasonal 06/05/2015   Chronic constipation 06/05/2015   Chronic lower back pain 06/05/2015   Vitamin D deficiency 06/05/2015   Central sleep apnea 06/05/2015   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   Nerve root pain 06/05/2015   Acquired polycythemia 06/05/2015   Anterior knee pain 47/82/9562   Dysmetabolic syndrome 13/07/6577   Failure of erection 06/05/2015   Gastro-esophageal reflux disease without esophagitis 06/05/2015   Neuropathy 06/05/2015   CAD (coronary artery disease)    Angina, class III ([place]) 05/23/2015   Essential hypertension 05/23/2015   Hyperlipidemia 05/23/2015   Inguinal hernia without mention of obstruction or gangrene, recurrent unilateral or unspecified 03/24/2013    Past Medical History:  Diagnosis Date   Allergy    Angina, class III (HCC)    Chronic constipation    Degeneration of intervertebral  disc of cervical region    Diabetes mellitus without complication ([place])    Diastolic dysfunction    a. 06/2017 Echo: EF 60-65%, no rwma, Gr1 DD.   Dyspnea    GERD (gastroesophageal reflux disease)    Hernia 1991   02/10/2012-RIH repair   Hyperlipidemia    Hypertension 2008   Nerve root pain    Neuropathy    Non-obstructive CAD (coronary artery disease)    a. 05/30/2015 cath: LM nl, mLAD 50% (FFR 0.83), LCx minor irregs, RCA minor irregs, EF 55-65%-->Med Rx; b. 03/2016 Cath: LM nl LAD 58m, D1/2/3 min irregs, LCX min irregs, OM1/2/3 nl, RCA min irregs, RPDA/RPAV/RPL1/RPL2 nl-->Med Rx; b. 07/2020 Cath: LM nl, LAD 2m, D1/2/3 min irregs, LCX min irregs, RCA min irregs, RPDA/RPAV/RPL1,2 nl, EF 55-65%.   Obesity, unspecified 2012   Personal history of tobacco use, presenting hazards to health 2012   Polycythemia    Rectal bleeding 02/11/2017   Recurrent Right Inguinal Hernia Repair 02/09/2011, 04/07/2013   Dr [name], [facility]   Sleep apnea    a. On CPAP;  b. 06/2017 Echo: no PAH.   Umbilical hernia without mention of obstruction or gangrene 02/09/2011   Dr [name], [facility]   Vitamin D deficiency     Family History  Problem  Relation Age of Onset   Heart Problems Mother    Heart Problems Father 17       myocardial infarction   Mental illness Brother    Other Maternal Aunt        COVID   Testicular cancer Paternal Uncle    Thyroid disease Daughter    Breast cancer Cousin    Past Surgical History:  Procedure Laterality Date   BACK SURGERY  12/2008   X2-LUMBAR   CARDIAC CATHETERIZATION N/A 05/30/2015   Procedure: Left Heart Cath;  Surgeon: Wellington Hampshire, MD;  Location: [place] [facility];  Service: Cardiovascular;  Laterality: N/A;   CARDIAC CATHETERIZATION N/A 04/16/2016   Procedure: Left Heart Cath and Coronary Angiography;  Surgeon: Wellington Hampshire, MD;  Location: [place] [facility];  Service: Cardiovascular;  Laterality: N/A;   COLONOSCOPY  2014   Dr. [name]   COLONOSCOPY WITH PROPOFOL  N/A 12/25/2016   Procedure: COLONOSCOPY WITH PROPOFOL;  Surgeon: Jonathon Bellows, MD;  Location: [facility] ENDOSCOPY;  Service: Endoscopy;  Laterality: N/A;   COLONOSCOPY WITH PROPOFOL N/A 01/29/2017   Procedure: COLONOSCOPY WITH PROPOFOL;  Surgeon: Jonathon Bellows, MD;  Location: [facility] ENDOSCOPY;  Service: Endoscopy;  Laterality: N/A;   COLONOSCOPY WITH PROPOFOL N/A 04/11/2020   Procedure: COLONOSCOPY WITH PROPOFOL;  Surgeon: Jonathon Bellows, MD;  Location: [facility] ENDOSCOPY;  Service: Gastroenterology;  Laterality: N/A;   ESOPHAGOGASTRODUODENOSCOPY (EGD) WITH PROPOFOL N/A 01/26/2019   Procedure: ESOPHAGOGASTRODUODENOSCOPY (EGD) WITH PROPOFOL;  Surgeon: Lin Landsman, MD;  Location: [facility] ENDOSCOPY;  Service: Gastroenterology;  Laterality: N/A;   ESOPHAGOGASTRODUODENOSCOPY (EGD) WITH PROPOFOL N/A 09/10/2021   Procedure: ESOPHAGOGASTRODUODENOSCOPY (EGD) WITH PROPOFOL;  Surgeon: Lin Landsman, MD;  Location: [facility] ENDOSCOPY;  Service: Gastroenterology;  Laterality: N/A;   EVALUATION UNDER ANESTHESIA WITH HEMORRHOIDECTOMY N/A 02/24/2017   Procedure: EXAM UNDER ANESTHESIA WITH POSSIBLE EXCISION OF INTERNAL HEMORRHOIDS;  Surgeon: Olean Ree, MD;  Location: [facility] ORS;  Service: General;  Laterality: N/A;   FISSURECTOMY  02/24/2017   Procedure: FISSURECTOMY;  Surgeon: Olean Ree, MD;  Location: [facility] ORS;  Service: General;;   HAND SURGERY Left 2020   HERNIA REPAIR Right 1991   INGUINAL HERNIA REPAIR Right 2012   Dr Teryl Lucy HERNIA REPAIR Right 2014   Dr [name]   INTRAVASCULAR PRESSURE WIRE/FFR STUDY N/A 07/30/2021   Procedure: INTRAVASCULAR PRESSURE WIRE/FFR STUDY;  Surgeon: Wellington Hampshire, MD;  Location: [place] [facility];  Service: Cardiovascular;  Laterality: N/A;   LEFT HEART CATH AND CORONARY ANGIOGRAPHY Left 08/05/2020   Procedure: LEFT HEART CATH AND CORONARY ANGIOGRAPHY poss PCI;  Surgeon: Wellington Hampshire, MD;  Location: [place] [facility];  Service: Cardiovascular;  Laterality: Left;    RIGHT/LEFT HEART CATH AND CORONARY ANGIOGRAPHY N/A 07/30/2021   Procedure: RIGHT/LEFT HEART CATH AND CORONARY ANGIOGRAPHY;  Surgeon: Wellington Hampshire, MD;  Location: [place] [facility];  Service: Cardiovascular;  Laterality: N/A;   Social History   Social History Narrative   Not on file   Immunization History  Administered Date(s) Administered   Influenza Inj Mdck Quad Pf 09/21/2019   Influenza, Seasonal, Injecte, Preservative Fre 11/15/2012   Influenza,inj,Quad PF,6+ Mos 10/10/2013, 08/24/2014, 09/05/2015, 09/25/2016, 10/21/2017, 10/05/2018, 09/26/2020, 10/07/2021   Influenza-Unspecified 08/24/2014   PFIZER(Purple Top)SARS-COV-2 Vaccination 03/15/2020, 04/05/2020, 10/20/2020, 05/23/2021   Pneumococcal Conjugate-13 09/25/2016   Pneumococcal Polysaccharide-23 06/05/2015   Tdap 02/23/2013   Zoster Recombinat (Shingrix) 07/21/2021     Objective: Vital Signs: BP 135/89 (BP Location: Left Arm, Patient Position: Sitting,  Cuff Size: Large)   Pulse 76   Ht 5\' 11"  (1.803 m)   Wt 209 lb (94.8 kg)   BMI 29.15 kg/m    Physical Exam HENT:     Right Ear: External ear normal.     Left Ear: External ear normal.     Mouth/Throat:     Mouth: Mucous membranes are moist.     Pharynx: Oropharynx is clear.  Eyes:     Conjunctiva/sclera: Conjunctivae normal.  Cardiovascular:     Rate and Rhythm: Normal rate and regular rhythm.  Pulmonary:     Effort: Pulmonary effort is normal.     Breath sounds: Normal breath sounds.  Musculoskeletal:     Right lower leg: No edema.     Left lower leg: No edema.  Skin:    General: Skin is warm and dry.     Comments: Scattered hyperpigmented spots on lateral upper arm and on distal forearm and on sides of the face  Neurological:     Mental Status: He is alert.     Motor: Weakness present.     Comments: Shoulder abduction and elbow extension and flexion 3/5 strength limited with pain Grip strength 5/5 Hip flexion 2/5 straight leg raise lying supine Knee  flexion and extension 4/5  Psychiatric:        Mood and Affect: Mood normal.    Appears to have atrophy of upper arm and quadriceps muscle groups relative to distal areas Musculoskeletal Exam:  Neck slightly restricted rotation ROM no swelling Shoulders full ROM no tenderness or swelling Severe tenderness to pressure over trapezius or possible some supraspinatus muscle body with muscle knots pain radiating to the arms with pressure Elbows full ROM no tenderness or swelling Wrists full ROM no tenderness or swelling Fingers full ROM no tenderness or swelling Knees full ROM no tenderness or swelling, patellofemoral crepitus on both sides Ankles full ROM no tenderness or swelling    Investigation: No additional findings.  Imaging: CT CHEST W CONTRAST  Result Date: 10/27/2021 CLINICAL DATA:  Unintended weight loss. Shortness of breath and fatigue. EXAM: CT CHEST WITH CONTRAST TECHNIQUE: Multidetector CT imaging of the chest was performed during intravenous contrast administration. CONTRAST:  13mL OMNIPAQUE IOHEXOL 300 MG/ML  SOLN COMPARISON:  CT AP 03/04/2018 FINDINGS: Cardiovascular: The heart size appears normal. No pericardial effusion. Coronary artery calcifications. Mediastinum/Nodes: Normal appearance of the thyroid gland. The trachea appears patent and is midline. Normal appearance of the esophagus. No axillary, supraclavicular, mediastinal, or hilar adenopathy. Lungs/Pleura: No pleural effusion, airspace consolidation, or atelectasis. Tiny calcified nodules are noted within the periphery of the posterior right base. No suspicious lung nodules or mass identified. Upper Abdomen: There is no acute finding within the imaged portions of the upper abdomen. Relative hypertrophy of the caudate lobe and lateral segment of left hepatic lobe noted. Musculoskeletal: No acute or suspicious osseous findings. IMPRESSION: 1. No acute cardiopulmonary abnormalities. No mass or adenopathy identified. 2.  Coronary artery calcifications. 3. Morphologic features of the liver which may be seen in early cirrhosis. Electronically Signed   By: Kerby Moors M.D.   On: 10/27/2021 18:22   US THYROID  Result Date: 10/23/2021 CLINICAL DATA:  Goiter. EXAM: THYROID ULTRASOUND TECHNIQUE: Ultrasound examination of the thyroid gland and adjacent soft tissues was performed. COMPARISON:  None. FINDINGS: Parenchymal Echotexture: Mildly heterogeneous Isthmus: 0.6 cm Right lobe: 4.9 x 1.7 x 1.9 cm Left lobe: 4.8 x 1.6 x 2.5 cm _________________________________________________________ Estimated total number of nodules >/= 1  cm: 0 Number of spongiform nodules >/=  2 cm not described below (TR1): 0 Number of mixed cystic and solid nodules >/= 1.5 cm not described below ([place]): 0 _________________________________________________________ Subcentimeter cystic left thyroid nodule does not meet criteria for FNA or imaging surveillance. IMPRESSION: No suspicious thyroid nodules. The above is in keeping with the ACR TI-RADS recommendations - J Am Coll Radiol 2017;14:587-595. Electronically Signed   By: Miachel Roux M.D.   On: 10/23/2021 07:59    Recent Labs: Lab Results  Component Value Date   WBC 8.0 07/21/2021   HGB 16.3 07/30/2021   PLT 226 07/21/2021   NA 139 07/30/2021   K 3.8 07/30/2021   CL 101 07/21/2021   CO2 28 07/21/2021   GLUCOSE 90 07/21/2021   BUN 12 07/21/2021   CREATININE 0.80 10/27/2021   BILITOT 0.7 07/21/2021   ALKPHOS 61 02/28/2021   AST 37 (H) 07/21/2021   ALT 30 07/21/2021   PROT 7.5 07/21/2021   ALBUMIN 4.3 02/28/2021   CALCIUM 9.8 07/21/2021   GFRAA 106 09/26/2020    Speciality Comments: No specialty comments available.  Procedures:  No procedures performed Allergies: Gabapentin, Latex, and Penicillins   Assessment / Plan:     Visit Diagnoses: Elevated CK - Plan: CK, Myositis Assessr Plus Jo-1 Autoabs, Serum protein electrophoresis with reflex, Aldolase  Elevated CK associated with  signs of proximal muscle atrophy and weakness also increased dyspnea and dysphagia without obvious structural changes on CT chest and recent endoscopy.  Constellation of symptoms could be consistent with inflammatory myopathy.  CK is only mildly elevated but at this point may have had substantial amounts of muscle atrophy confounding this value.  We will check labs today for myositis associated antibodies also protein electrophoresis with the substantial unintentional weight loss.  Skin hyperpigmentation on lateral arms and lateral face not specific for dermatomyositis process but also possible.  Pica - Plan: Iron, TIBC and Ferritin Panel  He also reports worsening pica symptoms eating ice 3 times daily is concerned about iron deficiency.  His most recent CBC actually demonstrated mildly elevated hemoglobin level but with the dysphagia and other problems we will check iron studies as well.  Orders: Orders Placed This Encounter  Procedures   CK   Myositis Assessr Plus Jo-1 Autoabs   Serum protein electrophoresis with reflex   Aldolase   Iron, TIBC and Ferritin Panel    No orders of the defined types were placed in this encounter.   Follow-Up Instructions: No follow-ups on file.   Collier Salina, MD  Note - This record has been created using [employer].  Chart creation errors have been sought, but may not always  have been located. Such creation errors do not reflect on  the standard of medical care.

## 2021-11-10 ENCOUNTER — Other Ambulatory Visit: Payer: Self-pay

## 2021-11-10 ENCOUNTER — Ambulatory Visit: Payer: Medicaid Other | Admitting: Internal Medicine

## 2021-11-10 ENCOUNTER — Encounter: Payer: Self-pay | Admitting: Internal Medicine

## 2021-11-10 VITALS — BP 135/89 | HR 76 | Ht 71.0 in | Wt 209.0 lb

## 2021-11-10 DIAGNOSIS — R1310 Dysphagia, unspecified: Secondary | ICD-10-CM

## 2021-11-10 DIAGNOSIS — R748 Abnormal levels of other serum enzymes: Secondary | ICD-10-CM | POA: Diagnosis not present

## 2021-11-10 DIAGNOSIS — M6258 Muscle wasting and atrophy, not elsewhere classified, other site: Secondary | ICD-10-CM

## 2021-11-10 DIAGNOSIS — R0602 Shortness of breath: Secondary | ICD-10-CM

## 2021-11-10 DIAGNOSIS — F5089 Other specified eating disorder: Secondary | ICD-10-CM | POA: Diagnosis not present

## 2021-11-11 ENCOUNTER — Encounter: Payer: Self-pay | Admitting: Internal Medicine

## 2021-11-19 LAB — MYOSITIS ASSESSR PLUS JO-1 AUTOABS
EJ Autoabs: NOT DETECTED
Jo-1 Autoabs: <1 AI
Ku Autoabs: NOT DETECTED
Mi-2 Autoabs: NOT DETECTED
OJ Autoabs: NOT DETECTED
PL-12 Autoabs: NOT DETECTED
PL-7 Autoabs: NOT DETECTED
SRP Autoabs: NOT DETECTED

## 2021-11-19 LAB — IRON,TIBC AND FERRITIN PANEL
%SAT: 34 % (calc) (ref 20–48)
Ferritin: 228 ng/mL (ref 38–380)
Iron: 125 ug/dL (ref 50–180)
TIBC: 368 mcg/dL (calc) (ref 250–425)

## 2021-11-19 LAB — PROTEIN ELECTROPHORESIS, SERUM, WITH REFLEX
Albumin ELP: 4.4 g/dL (ref 3.8–4.8)
Alpha 1: 0.3 g/dL (ref 0.2–0.3)
Alpha 2: 0.8 g/dL (ref 0.5–0.9)
Beta 2: 0.5 g/dL (ref 0.2–0.5)
Beta Globulin: 0.5 g/dL (ref 0.4–0.6)
Gamma Globulin: 1.1 g/dL (ref 0.8–1.7)
Total Protein: 7.6 g/dL (ref 6.1–8.1)

## 2021-11-19 LAB — CK: Total CK: 356 U/L — ABNORMAL HIGH (ref 44–196)

## 2021-11-19 LAB — ALDOLASE: Aldolase: 6.2 U/L (ref <=8.1)

## 2021-11-21 ENCOUNTER — Other Ambulatory Visit: Payer: Self-pay | Admitting: Gastroenterology

## 2021-11-21 DIAGNOSIS — R1319 Other dysphagia: Secondary | ICD-10-CM

## 2021-11-21 DIAGNOSIS — R634 Abnormal weight loss: Secondary | ICD-10-CM

## 2021-11-24 ENCOUNTER — Other Ambulatory Visit: Payer: Self-pay

## 2021-11-24 DIAGNOSIS — R1319 Other dysphagia: Secondary | ICD-10-CM

## 2021-11-24 DIAGNOSIS — R634 Abnormal weight loss: Secondary | ICD-10-CM

## 2021-11-24 MED ORDER — OMEPRAZOLE 40 MG PO CPDR: 40.0000 mg | DELAYED_RELEASE_CAPSULE | Freq: Two times a day (BID) | ORAL | 2 refills | Status: DC

## 2021-11-24 NOTE — Progress Notes (Signed)
Sent in refill as requested 

## 2021-11-26 ENCOUNTER — Ambulatory Visit
Admission: RE | Admit: 2021-11-26 | Discharge: 2021-11-26 | Disposition: A | Discharge status: Home / Self Care | Payer: Medicaid Other | Source: Ambulatory Visit | Attending: Gastroenterology | Admitting: Gastroenterology

## 2021-11-26 ENCOUNTER — Other Ambulatory Visit: Payer: Self-pay

## 2021-11-26 DIAGNOSIS — R634 Abnormal weight loss: Secondary | ICD-10-CM | POA: Diagnosis not present

## 2021-11-26 DIAGNOSIS — Z981 Arthrodesis status: Secondary | ICD-10-CM | POA: Diagnosis not present

## 2021-11-26 DIAGNOSIS — R112 Nausea with vomiting, unspecified: Secondary | ICD-10-CM | POA: Insufficient documentation

## 2021-11-26 DIAGNOSIS — I700 Atherosclerosis of aorta: Secondary | ICD-10-CM | POA: Diagnosis not present

## 2021-11-26 MED ORDER — IOHEXOL 300 MG/ML  SOLN
100.0000 mL | Freq: Once | INTRAMUSCULAR | Status: AC | PRN
  Administered 2021-11-26: 100 mL via INTRAVENOUS

## 2021-11-27 ENCOUNTER — Other Ambulatory Visit: Payer: Medicaid Other

## 2021-11-28 ENCOUNTER — Encounter
Admission: RE | Admit: 2021-11-28 | Discharge: 2021-11-28 | Disposition: A | Discharge status: Home / Self Care | Payer: Medicaid Other | Source: Ambulatory Visit | Attending: Gastroenterology | Admitting: Gastroenterology

## 2021-11-28 ENCOUNTER — Other Ambulatory Visit: Payer: Medicaid Other

## 2021-11-28 ENCOUNTER — Other Ambulatory Visit: Payer: Self-pay

## 2021-11-28 DIAGNOSIS — R112 Nausea with vomiting, unspecified: Secondary | ICD-10-CM | POA: Insufficient documentation

## 2021-11-28 MED ORDER — TECHNETIUM TC 99M SULFUR COLLOID
2.0000 | Freq: Once | INTRAVENOUS | Status: AC
  Administered 2021-11-28: 2.23 via ORAL

## 2021-12-01 ENCOUNTER — Encounter: Payer: Self-pay | Admitting: Gastroenterology

## 2021-12-01 ENCOUNTER — Ambulatory Visit (INDEPENDENT_AMBULATORY_CARE_PROVIDER_SITE_OTHER): Payer: Medicaid Other | Admitting: Gastroenterology

## 2021-12-01 DIAGNOSIS — R634 Abnormal weight loss: Secondary | ICD-10-CM

## 2021-12-01 NOTE — Progress Notes (Signed)
[name] Sear, MD [address]  [place]  [place], [place] [address]  Main: [phone]  Fax: [phone]    Gastroenterology Consultation Tele Visit  Referring Provider:     Steele Sizer, MD Primary Care Physician:  [name] Sizer, MD Primary Gastroenterologist:  Dr. Cephas [name] Reason for Consultation:     Unexplained weight loss        HPI:   [name] [name] is a 60 y.o. male referred by Dr. Steele Sizer, MD  for consultation & management of unexplained weight loss  Virtual Visit via Telephone Note  I connected with [name] [name] on 12/01/21 at  1:30 PM EST by telephone and verified that I am speaking with the correct person using two identifiers.   I discussed the limitations, risks, security and privacy concerns of performing an evaluation and management service by telephone and the availability of in person appointments. I also discussed with the patient that there may be a patient responsible charge related to this service. The patient expressed understanding and agreed to proceed.  Location of the Patient: Home  Location of the provider: Office  Persons participating in the visit: Patient and provider only  History of Present Illness:  Patient changed to his in person visit to phone visit because he is watching his grandson who has URI symptoms.  Patient has ongoing unintentional weight loss, today at approximately 60 pounds, trouble swallowing and hoarseness of voice.  Patient underwent extensive work-up to date and found to have mildly elevated total CK.  Most recently, he is followe evaluated by rheumatologist, Dr. 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  Patient continues to have ongoing muscle loss, feels weak when he tries to swallow solids or liquids.  He underwent EGD and colonoscopy which were unremarkable.  Esophageal biopsies were negative.  He underwent CT chest abdomen and pelvis which were unremarkable.  Gastric  emptying study was normal.  He reports that omeprazole 40 mg p.o. twice daily helps with his stomach upset.  He reports having bowel movement daily or every other day.  Patient has history of diabetes, taking Ozempic every 2 weeks or so  NSAIDs: None  Antiplts/Anticoagulants/Anti thrombotics: None  GI Procedures:   EGD 01/26/2019 - Normal duodenal bulb and second portion of the duodenum. - Normal stomach. Biopsied. - Esophagogastric landmarks identified. - Normal gastroesophageal junction and esophagus.   DIAGNOSIS:  A.  STOMACH; COLD BIOPSY:  - ANTRAL MUCOSA WITH MILD NON-SPECIFIC CHRONIC AND FOCAL ACTIVE  GASTRITIS.  - UNREMARKABLE OXYNTIC MUCOSA.  - NEGATIVE FOR HELICOBACTER; IHC STAIN EXAMINED.  - NEGATIVE FOR INTESTINAL METAPLASIA, DYSPLASIA, AND MALIGNANCY.    Colonoscopy in 2017 and 2018 by Dr. Vicente [name] - Preparation of the colon was poor. - Stool in the sigmoid colon. - No specimens collected.   - Three 5 to 7 mm polyps in the transverse colon, removed with a cold snare. Resected and retrieved. - One 3 mm polyp in the ascending colon, removed with a cold biopsy forceps. Resected and retrieved. - Non-bleeding internal hemorrhoids. - The examination was otherwise normal on direct and retroflexion views.   DIAGNOSIS:  A. COLON POLYP, ASCENDING; COLD BIOPSY:  - TUBULAR ADENOMA.  - NEGATIVE FOR HIGH-GRADE DYSPLASIA AND MALIGNANCY.   B.  COLON POLYP 2, TRANSVERSE; COLD SNARE:  - TUBULAR ADENOMAS (2), NEGATIVE FOR HIGH-GRADE DYSPLASIA AND  MALIGNANCY.  - POLYPOID COLONIC MUCOSA WITH HEMORRHAGE OF THE LAMINA PROPRIA (1).   Past Medical History:  Diagnosis  Date   Allergy    Angina, class III ([place])    Chronic constipation    Degeneration of intervertebral disc of cervical region    Diabetes mellitus without complication (HCC)    Diastolic dysfunction    a. 06/2017 Echo: EF 60-65%, no rwma, Gr1 DD.   Dyspnea    GERD (gastroesophageal reflux disease)    Hernia 1991    02/10/2012-RIH repair   Hyperlipidemia    Hypertension 2008   Nerve root pain    Neuropathy    Non-obstructive CAD (coronary artery disease)    a. 05/30/2015 cath: LM nl, mLAD 50% (FFR 0.83), LCx minor irregs, RCA minor irregs, EF 55-65%-->Med Rx; b. 03/2016 Cath: LM nl LAD 77m D1/2/3 min irregs, LCX min irregs, OM1/2/3 nl, RCA min irregs, RPDA/RPAV/RPL1/RPL2 nl-->Med Rx; b. 07/2020 Cath: LM nl, LAD 372mD1/2/3 min irregs, LCX min irregs, RCA min irregs, RPDA/RPAV/RPL1,2 nl, EF 55-65%.   Obesity, unspecified 2012   Personal history of tobacco use, presenting hazards to health 2012   Polycythemia    Rectal bleeding 02/11/2017   Recurrent Right Inguinal Hernia Repair 02/09/2011, 04/07/2013   [facility]    a. On CPAP;  b. 06/2017 Echo: no PAH.   Umbilical hernia without mention of obstruction or gangrene 02/09/2011   [facility]     Past Surgical History:  Procedure Laterality Date   BACK SURGERY  12/2008   [facility] CARDIAC CATHETERIZATION N/A 05/30/2015   Procedure: Left Heart Cath;  Surgeon: [name] HampshireMD;  [facility];  Service: Cardiovascular;  Laterality: N/A;   CARDIAC CATHETERIZATION N/A 04/16/2016   Procedure: Left Heart Cath and Coronary Angiography;  Surgeon: [name] HampshireMD;  [facility];  Service: Cardiovascular;  Laterality: N/A;   COLONOSCOPY  2014   Dr. SaJamal [name] COLONOSCOPY WITH PROPOFOL N/A 12/25/2016   Procedure: COLONOSCOPY WITH PROPOFOL;  Surgeon: [name] BellowsMD;  Location: [facility] ENDOSCOPY;  Service: Endoscopy;  Laterality: N/A;   COLONOSCOPY WITH PROPOFOL N/A 01/29/2017   Procedure: COLONOSCOPY WITH PROPOFOL;  Surgeon: [name] BellowsMD;  Location: [facility] ENDOSCOPY;  Service: Endoscopy;  Laterality: N/A;   COLONOSCOPY WITH PROPOFOL N/A 04/11/2020   Procedure: COLONOSCOPY WITH PROPOFOL;  Surgeon: [name] BellowsMD;  [facility];  Service: Gastroenterology;  Laterality: N/A;    ESOPHAGOGASTRODUODENOSCOPY (EGD) WITH PROPOFOL N/A 01/26/2019   Procedure: ESOPHAGOGASTRODUODENOSCOPY (EGD) WITH PROPOFOL;  Surgeon: VaLin LandsmanMD;  [facility];  Service: Gastroenterology;  Laterality: N/A;   ESOPHAGOGASTRODUODENOSCOPY (EGD) WITH PROPOFOL N/A 09/10/2021   Procedure: ESOPHAGOGASTRODUODENOSCOPY (EGD) WITH PROPOFOL;  Surgeon: VaLin LandsmanMD;  [facility];  Service: Gastroenterology;  Laterality: N/A;   EVALUATION UNDER ANESTHESIA WITH HEMORRHOIDECTOMY N/A 02/24/2017   Procedure: EXAM UNDER ANESTHESIA WITH POSSIBLE EXCISION OF INTERNAL HEMORRHOIDS;  Surgeon: JoOlean ReeMD;  Location: [facility] ORS;  Service: General;  Laterality: N/A;   FISSURECTOMY  02/24/2017   Procedure: FISSURECTOMY;  Surgeon: JoOlean ReeMD;  Location: [facility] ORS;  Service: General;;   HAND SURGERY Left 2020   HERNIA REPAIR Right 1991   INGUINAL HERNIA REPAIR Right 2012   Dr SaTeryl LucyERNIA REPAIR Right 2014   Dr SaJamal [name] INTRAVASCULAR PRESSURE WIRE/FFR STUDY N/A 07/30/2021   Procedure: INTRAVASCULAR PRESSURE WIRE/FFR STUDY;  Surgeon: ArWellington HampshireMD;  [place];  Service: Cardiovascular;  Laterality: N/A;  LEFT HEART CATH AND CORONARY ANGIOGRAPHY Left 08/05/2020   Procedure: LEFT HEART CATH AND CORONARY ANGIOGRAPHY poss PCI;  Surgeon: Wellington Hampshire, MD;  Location: [place] CV LAB;  Service: Cardiovascular;  Laterality: Left;   RIGHT/LEFT HEART CATH AND CORONARY ANGIOGRAPHY N/A 07/30/2021   Procedure: RIGHT/LEFT HEART CATH AND CORONARY ANGIOGRAPHY;  Surgeon: Wellington Hampshire, MD;  Location: [place] [facility] LAB;  Service: Cardiovascular;  Laterality: N/A;    Current Outpatient Medications:    ACCU-CHEK GUIDE test strip, USE AS DIRECTED TO TEST BLOOD SUGAR THREE TIMES DAILY AS NEEDED, Disp: 100 strip, Rfl: 1   Accu-Chek Softclix Lancets lancets, TEST TWICE DAILY, Disp: 100 each, Rfl: 3   aspirin 81 MG tablet, Take 81 mg by mouth  daily., Disp: , Rfl:    blood glucose meter kit and supplies, 1 each by Other route in the morning and at bedtime. Dispense based on patient and insurance preference. Use up to two  times daily. (FOR ICD-10 E10.9, E11.9)., Disp: 1 each, Rfl: 0   BREO ELLIPTA 100-25 MCG/INH AEPB, Inhale 1 puff into the lungs daily., Disp: , Rfl:    carvedilol (COREG) 6.25 MG tablet, Take 1 tablet (6.25 mg total) by mouth 2 (two) times daily., Disp: 180 tablet, Rfl: 1   cetirizine (ZYRTEC) 10 MG tablet, Take 10 mg by mouth daily as needed for allergies. , Disp: , Rfl: 1   Dulaglutide (TRULICITY) 0.01 VC/9.4WH SOPN, Inject 0.75 mg into the skin once a week. Monday, Disp: , Rfl:    ezetimibe (ZETIA) 10 MG tablet, Take 1 tablet (10 mg total) by mouth daily., Disp: 90 tablet, Rfl: 1   ketoconazole (NIZORAL) 2 % shampoo, Apply 1 application topically 3 (three) times a week. Wash scalp/body 3 times a week as needed for alres, let sit 5 minutes before rinsing off, avoid eyes. May use once monthly for maintenance., Disp: 120 mL, Rfl: 11   losartan-hydrochlorothiazide (HYZAAR) 50-12.5 MG tablet, Take 1 tablet by mouth daily., Disp: , Rfl:    lubiprostone (AMITIZA) 24 MCG capsule, Take 1 capsule (24 mcg total) by mouth 2 (two) times daily with a meal., Disp: , Rfl:    mupirocin ointment (BACTROBAN) 2 %, Apply 1 application topically daily. Qd to excision site, Disp: 22 g, Rfl: 1   nitroGLYCERIN (NITROSTAT) 0.4 MG SL tablet, Place 1 tablet (0.4 mg total) under the tongue every 5 (five) minutes as needed for chest pain., Disp: , Rfl:    omeprazole (PRILOSEC) 40 MG capsule, TAKE 1 CAPSULE(40 MG) BY MOUTH TWICE DAILY BEFORE A MEAL, Disp: 60 capsule, Rfl: 2   omeprazole (PRILOSEC) 40 MG capsule, Take 1 capsule (40 mg total) by mouth 2 (two) times daily before a meal., Disp: 60 capsule, Rfl: 2   pantoprazole (PROTONIX) 40 MG tablet, Take 40 mg by mouth daily., Disp: , Rfl:    pregabalin (LYRICA) 50 MG capsule, Take 1 capsule (50 mg  total) by mouth 3 (three) times daily., Disp: 270 capsule, Rfl: 1   ranolazine (RANEXA) 1000 MG SR tablet, Take 1 tablet (1,000 mg total) by mouth 2 (two) times daily., Disp: 180 tablet, Rfl: 3   sildenafil (REVATIO) 20 MG tablet, Take by mouth., Disp: , Rfl:     Family History  Problem Relation Age of Onset   Heart Problems Mother    Heart Problems Father 48       myocardial infarction   Mental illness Brother    Other Maternal Aunt  COVID   Testicular cancer Paternal Uncle    Thyroid disease Daughter    Breast cancer Cousin      Social History   Tobacco Use   Smoking status: Former    Types: Cigars    Start date: 12/28/2002    Quit date: 06/04/2005    Years since quitting: 16.5   Smokeless tobacco: Never   Tobacco comments:    quit 2006-smoked 1 cigar occ  Vaping Use   Vaping Use: Never used  Substance Use Topics   Alcohol use: Not Currently   Drug use: No    Allergies as of 12/01/2021 - Review Complete 12/01/2021  Allergen Reaction Noted   Gabapentin Other (See Comments) 04/16/2015   Latex Rash 06/20/2015   Penicillins Hives and Rash 03/23/2013     Imaging Studies: Reviewed  Assessment and Plan:   [name] [name] is a 61 y.o. male with history of metabolic syndrome has a telephone consultation for ongoing unintentional weight loss, trouble swallowing Patient underwent upper endoscopy which was unremarkable.  Colonoscopy was attempted twice with poor prep Gastric emptying study normal The etiology for weight loss and generalized weakness is still unexplained. Patient had extensive work-up by rheumatology, neurology which was unremarkable except for mildly elevated creatinine kinase.  No evidence of hypothyroidism. Given that patient's hemoglobin A1c is normal, advised him to completely stop Ozempic Recommend speech pathology evaluation Patient has history of polycythemia, referred to oncology    Follow Up Instructions:   I discussed the assessment and  treatment plan with the patient. The patient was provided an opportunity to ask questions and all were answered. The patient agreed with the plan and demonstrated an understanding of the instructions.   The patient was advised to call back or seek an in-person evaluation if the symptoms worsen or if the condition fails to improve as anticipated.  I provided 15 minutes of non-face-to-face time during this encounter.   Follow up in 3 to 6 months   [name] Darby, MD

## 2021-12-02 ENCOUNTER — Other Ambulatory Visit: Payer: Self-pay

## 2021-12-02 DIAGNOSIS — R1310 Dysphagia, unspecified: Secondary | ICD-10-CM

## 2021-12-02 DIAGNOSIS — R634 Abnormal weight loss: Secondary | ICD-10-CM

## 2021-12-02 NOTE — Progress Notes (Signed)
Sent referral to oncology for unexplained weight loss

## 2021-12-02 NOTE — Progress Notes (Signed)
Sent in  referral to speech therapy as requested by Dr Marius Ditch

## 2021-12-04 ENCOUNTER — Other Ambulatory Visit: Payer: Self-pay

## 2021-12-04 ENCOUNTER — Encounter: Payer: Self-pay | Admitting: Family Medicine

## 2021-12-04 DIAGNOSIS — R0602 Shortness of breath: Secondary | ICD-10-CM

## 2021-12-05 MED ORDER — FLUTICASONE FUROATE-VILANTEROL 100-25 MCG/ACT IN AEPB: 1.0000 | INHALATION_SPRAY | Freq: Every day | RESPIRATORY_TRACT | 3 refills | Status: DC

## 2021-12-07 NOTE — Progress Notes (Signed)
Office Visit Note  Patient: [name] [name]             Date of Birth: 10-04-1961           MRN: [id number]             PCP: Steele Sizer, MD Referring: Steele Sizer, MD Visit Date: 12/08/2021   Subjective:  Follow-up (SOB, not doing well, walking with a cane, balance issues)   History of Present Illness: DAYON WITT is a 60 y.o. male here for follow up with progressively worsening muscle pain and weakness and unintentional weight loss. Lab testing at initial visit was negative for myositis antibody panel CK redemonstrated mild elevation, 356 from 398, aldolase and iron studies were normal. He feels symptoms are progressively worsening with ongoing weight loss weakness now having more episodes of pain or weakness around the flanks and ribs.  Persistent nausea and vomiting along with the weight loss he underwent CT abdomen pelvis and gastric emptying study that were unremarkable.  Previous HPI 11/10/21 RYLIE KNIERIM is a 60 y.o. male here with muscle pains, mouth sores, shortness of breath and unintentional weight loss.  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  However really in about the past 2 years he has been noticing progressive unintentional weight loss and decreased exertion tolerance and strength.  He was working on increasing walking exercise for obesity and got up to about 2 miles at a time but subsequently has experienced progressive weakening and limited ability to withstand exertion.  This is accompanied by severe shortness of breath and several syncopal episodes and falls.  He has become unable to sit upright unassisted for long periods of time or to rise from seated position without use of his upper extremities.  At this point about 60 pounds of weight loss and still going.  He has joint and muscle pains in multiple areas  his worst problem right now is extremely painful muscle knots and sensitivity in the bilateral shoulders with radiation down his arms with any pressure. He feels like his neck ad shoulder muscles are too weak to support his head at times.  He is on Lyrica at this time.  He does not tolerate steroid medications he has previously caused a lot of side effects when he was exposed to them related to sports type injuries in the past so none tried for this condition. He has seen several specialist for these problems evaluated by gastroenterology for dysphagia with a unremarkable EGD.  He has seen pulmonology CT imaging of the chest without significant lung disease and exertional testing did not show severe PFT abnormalities consistent with symptoms.  He has seen [facility] for his cervical spine disease findings thought to be consistent with nerve damage in multiple areas was referred to physical therapy but cannot do much work with them since he cannot tolerate the exertion for even range of motion exercises.  Lab testing mostly unremarkable except he now has a moderately elevated CK of 398 which is new compared to labs checked earlier this year.    Labs reviewed 09/2021 CK 398 ANA neg RF neg CCP neg TSH wnl B12 > 2000   10/27/21 CT Chest w Contrast IMPRESSION: 1. No acute cardiopulmonary abnormalities. No mass or adenopathy identified. 2. Coronary artery calcifications. 3. Morphologic features of the liver which may be seen in  early cirrhosis.   Review of Systems  Constitutional:  Positive for fatigue.  HENT:  Positive for mouth dryness.   Eyes:  Positive for dryness.  Respiratory:  Positive for shortness of breath.   Cardiovascular:  Negative for swelling in legs/feet.  Gastrointestinal:  Positive for constipation and diarrhea.  Endocrine: Positive for heat intolerance, excessive thirst and increased urination.  Genitourinary:  Negative for difficulty urinating.   Musculoskeletal:  Positive for joint pain, gait problem, joint pain, joint swelling, muscle weakness, morning stiffness and muscle tenderness.  Skin:  Positive for rash.  Allergic/Immunologic: Negative for susceptible to infections.  Neurological:  Positive for numbness and weakness.  Hematological:  Positive for bruising/bleeding tendency.  Psychiatric/Behavioral:  Positive for sleep disturbance.    PMFS History:  Patient Active Problem List   Diagnosis Date Noted   Elevated CK 11/10/2021   Muscle atrophy of lower extremity 11/10/2021   Pica 11/10/2021   Loss of weight    Dysphagia    SOB (shortness of breath)    Asthma 05/14/2021   Multiple thyroid nodules 05/14/2021   Localized, primary osteoarthritis of hand 09/28/2019   Seizure ([place]) 02/22/2018   Mild cognitive impairment 11/17/2017   Radiculopathy of cervical region 11/17/2017   Stenosis of carotid artery 09/08/2017   Cervical stenosis of spine 09/08/2017   Headache disorder 02/15/2017   Memory loss or impairment 02/15/2017   Coronary artery disease involving native coronary artery of native heart    Radiculitis of left cervical region 03/05/2016   Diabetic neuropathy associated with type 2 diabetes mellitus ([place]) 12/05/2015   Patellar subluxation 06/05/2015   Allergic rhinitis, seasonal 06/05/2015   Chronic constipation 06/05/2015   Chronic lower back pain 06/05/2015   Vitamin D deficiency 06/05/2015   Central sleep apnea 06/05/2015   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   Nerve root pain 06/05/2015   Acquired polycythemia 06/05/2015   Anterior knee pain 69/48/5462   Dysmetabolic syndrome 70/35/0093   Failure of erection 06/05/2015   Gastro-esophageal reflux disease without esophagitis 06/05/2015   Neuropathy 06/05/2015   CAD (coronary artery disease)    Angina, class III ([place]) 05/23/2015   Essential hypertension 05/23/2015   Hyperlipidemia 05/23/2015   Inguinal hernia without mention of obstruction or gangrene, recurrent  unilateral or unspecified 03/24/2013    Past Medical History:  Diagnosis Date   Allergy    Angina, class III (HCC)    Chronic constipation    Degeneration of intervertebral disc of cervical region    Diabetes mellitus without complication ([place])    Diastolic dysfunction    a. 06/2017 Echo: EF 60-65%, no rwma, Gr1 DD.   Dyspnea    GERD (gastroesophageal reflux disease)    Hernia 1991   02/10/2012-RIH repair   Hyperlipidemia    Hypertension 2008   Nerve root pain    Neuropathy    Non-obstructive CAD (coronary artery disease)    a. 05/30/2015 cath: LM nl, mLAD 50% (FFR 0.83), LCx minor irregs, RCA minor irregs, EF 55-65%-->Med Rx; b. 03/2016 Cath: LM nl LAD 37m, D1/2/3 min irregs, LCX min irregs, OM1/2/3 nl, RCA min irregs, RPDA/RPAV/RPL1/RPL2 nl-->Med Rx; b. 07/2020 Cath: LM nl, LAD 80m, D1/2/3 min irregs, LCX min irregs, RCA min irregs, RPDA/RPAV/RPL1,2 nl, EF 55-65%.   Obesity, unspecified 2012   Personal history of tobacco use, presenting hazards to health 2012   Polycythemia    Rectal bleeding 02/11/2017   Recurrent Right Inguinal Hernia Repair 02/09/2011, 04/07/2013   Dr [name], [facility]   Shortness of breath  Sleep apnea    a. On CPAP;  b. 06/2017 Echo: no PAH.   Umbilical hernia without mention of obstruction or gangrene 02/09/2011   Dr [name], [facility]   Vitamin D deficiency     Family History  Problem Relation Age of Onset   Heart Problems Mother    Heart Problems Father 14       myocardial infarction   Mental illness Brother    Other Maternal Aunt        COVID   Testicular cancer Paternal Uncle    Thyroid disease Daughter    Breast cancer Cousin    Past Surgical History:  Procedure Laterality Date   BACK SURGERY  12/2008   X2-LUMBAR   CARDIAC CATHETERIZATION N/A 05/30/2015   Procedure: Left Heart Cath;  Surgeon: Wellington Hampshire, MD;  Location: [place] [facility];  Service: Cardiovascular;  Laterality: N/A;   CARDIAC CATHETERIZATION N/A 04/16/2016   Procedure: Left Heart  Cath and Coronary Angiography;  Surgeon: Wellington Hampshire, MD;  Location: [place] [facility];  Service: Cardiovascular;  Laterality: N/A;   COLONOSCOPY  2014   Dr. [name]   COLONOSCOPY WITH PROPOFOL N/A 12/25/2016   Procedure: COLONOSCOPY WITH PROPOFOL;  Surgeon: Jonathon Bellows, MD;  Location: [facility] ENDOSCOPY;  Service: Endoscopy;  Laterality: N/A;   COLONOSCOPY WITH PROPOFOL N/A 01/29/2017   Procedure: COLONOSCOPY WITH PROPOFOL;  Surgeon: Jonathon Bellows, MD;  Location: [facility] ENDOSCOPY;  Service: Endoscopy;  Laterality: N/A;   COLONOSCOPY WITH PROPOFOL N/A 04/11/2020   Procedure: COLONOSCOPY WITH PROPOFOL;  Surgeon: Jonathon Bellows, MD;  Location: [facility] ENDOSCOPY;  Service: Gastroenterology;  Laterality: N/A;   ESOPHAGOGASTRODUODENOSCOPY (EGD) WITH PROPOFOL N/A 01/26/2019   Procedure: ESOPHAGOGASTRODUODENOSCOPY (EGD) WITH PROPOFOL;  Surgeon: Lin Landsman, MD;  Location: [facility] ENDOSCOPY;  Service: Gastroenterology;  Laterality: N/A;   ESOPHAGOGASTRODUODENOSCOPY (EGD) WITH PROPOFOL N/A 09/10/2021   Procedure: ESOPHAGOGASTRODUODENOSCOPY (EGD) WITH PROPOFOL;  Surgeon: Lin Landsman, MD;  Location: [facility] ENDOSCOPY;  Service: Gastroenterology;  Laterality: N/A;   EVALUATION UNDER ANESTHESIA WITH HEMORRHOIDECTOMY N/A 02/24/2017   Procedure: EXAM UNDER ANESTHESIA WITH POSSIBLE EXCISION OF INTERNAL HEMORRHOIDS;  Surgeon: Olean Ree, MD;  Location: [facility] ORS;  Service: General;  Laterality: N/A;   FISSURECTOMY  02/24/2017   Procedure: FISSURECTOMY;  Surgeon: Olean Ree, MD;  Location: [facility] ORS;  Service: General;;   HAND SURGERY Left 2020   HERNIA REPAIR Right 1991   INGUINAL HERNIA REPAIR Right 2012   Dr Teryl Lucy HERNIA REPAIR Right 2014   Dr [name]   INTRAVASCULAR PRESSURE WIRE/FFR STUDY N/A 07/30/2021   Procedure: INTRAVASCULAR PRESSURE WIRE/FFR STUDY;  Surgeon: Wellington Hampshire, MD;  Location: [place] [facility];  Service: Cardiovascular;  Laterality: N/A;   LEFT HEART CATH AND CORONARY  ANGIOGRAPHY Left 08/05/2020   Procedure: LEFT HEART CATH AND CORONARY ANGIOGRAPHY poss PCI;  Surgeon: Wellington Hampshire, MD;  Location: [place] [facility];  Service: Cardiovascular;  Laterality: Left;   RIGHT/LEFT HEART CATH AND CORONARY ANGIOGRAPHY N/A 07/30/2021   Procedure: RIGHT/LEFT HEART CATH AND CORONARY ANGIOGRAPHY;  Surgeon: Wellington Hampshire, MD;  Location: [place] [facility];  Service: Cardiovascular;  Laterality: N/A;   Social History   Social History Narrative   Not on file   Immunization History  Administered Date(s) Administered   Influenza Inj Mdck Quad Pf 09/21/2019   Influenza, Seasonal, Injecte, Preservative Fre 11/15/2012   Influenza,inj,Quad PF,6+ Mos 10/10/2013, 08/24/2014, 09/05/2015, 09/25/2016, 10/21/2017, 10/05/2018, 09/26/2020, 10/07/2021   Influenza-Unspecified 08/24/2014  PFIZER(Purple Top)SARS-COV-2 Vaccination 03/15/2020, 04/05/2020, 10/20/2020, 05/23/2021   Pneumococcal Conjugate-13 09/25/2016   Pneumococcal Polysaccharide-23 06/05/2015   Tdap 02/23/2013   Zoster Recombinat (Shingrix) 07/21/2021     Objective: Vital Signs: BP 124/83 (BP Location: Left Arm, Patient Position: Sitting, Cuff Size: Normal)   Pulse 71   Resp 20   Ht 5\' 11"  (1.803 m)   Wt 210 lb (95.3 kg)   BMI 29.29 kg/m    Physical Exam Cardiovascular:     Rate and Rhythm: Normal rate and regular rhythm.  Pulmonary:     Effort: Pulmonary effort is normal.     Breath sounds: Normal breath sounds.  Skin:    General: Skin is warm and dry.     Findings: No rash.  Neurological:     Mental Status: He is alert.     Motor: Weakness present.     Comments: Shoulder abduction, elbow extension and flexion 3/5 Grip strength 5/5 Hip flexion 3/5 Knee flexion and extension 4/5 Knee jerk reflexes dull to normal     Investigation: No additional findings.  Imaging: NM Gastric Emptying  Result Date: 11/28/2021 CLINICAL DATA:  Nausea and vomiting, concern for gastroparesis EXAM: NUCLEAR  MEDICINE GASTRIC EMPTYING SCAN TECHNIQUE: After oral ingestion of radiolabeled meal, sequential abdominal images were obtained for 3 hours. Percentage of activity emptying the stomach was calculated at 1 hour, 2 hour, and 3 hours. RADIOPHARMACEUTICALS:  2.23 mCi Tc-69m sulfur colloid in standardized meal COMPARISON:  CT November 26, 2021 FINDINGS: Expected location of the stomach in the left upper quadrant. Ingested meal empties the stomach gradually over the course of the study. 43% emptied at 1 hr ( normal >= 10%) 83% emptied at 2 hr ( normal >= 40%) 94% emptied at 3 hr ( normal >= 70%) IMPRESSION: Normal gastric emptying study. Electronically Signed   By: Dahlia Bailiff M.D.   On: 11/28/2021 23:32   CT ABDOMEN PELVIS W CONTRAST  Result Date: 11/26/2021 CLINICAL DATA:  Nausea/vomiting with a 60-pound weight loss over 1 year. EXAM: CT ABDOMEN AND PELVIS WITH CONTRAST TECHNIQUE: Multidetector CT imaging of the abdomen and pelvis was performed using the standard protocol following bolus administration of intravenous contrast. CONTRAST:  164mL OMNIPAQUE IOHEXOL 300 MG/ML  SOLN COMPARISON:  CT chest 10/27/2021; CT abdomen and pelvis 03/04/2018. FINDINGS: Lower chest: Small clustered calcified nodules are identified within the posterior and lateral right lung base, likely postinflammatory. No acute abnormality. Hepatobiliary: No focal liver abnormality is seen. No gallstones, gallbladder wall thickening, or biliary dilatation. Pancreas: Unremarkable. No pancreatic ductal dilatation or surrounding inflammatory changes. Spleen: Normal in size without focal abnormality. Adrenals/Urinary Tract: Normal adrenal glands. No nephrolithiasis, hydronephrosis, or mass. The urinary bladder is unremarkable. Stomach/Bowel: Stomach appears normal. The appendix is visualized and is within normal limits. No bowel wall thickening, inflammation or distension. Vascular/Lymphatic: Aortic atherosclerosis. There is no aneurysm. No  abdominopelvic adenopathy. Reproductive: Prostate is unremarkable. Other: No free fluid or fluid collections within the abdomen or pelvis. Postsurgical changes from right inguinal herniorrhaphy are identified and appears similar to 03/04/2018. Musculoskeletal: Status post posterior hardware fixation and interbody fusion at L4-5. No acute or suspicious osseous findings. IMPRESSION: 1. No acute findings within the abdomen or pelvis. No explanation for patient's nausea and vomiting. 2. No mass or adenopathy identified within the abdomen or pelvis. 3. Aortic atherosclerosis (ICD10-I70.0). Electronically Signed   By: Kerby Moors M.D.   On: 11/26/2021 16:58    Recent Labs: Lab Results  Component Value Date  WBC 8.0 07/21/2021   HGB 16.3 07/30/2021   PLT 226 07/21/2021   NA 139 07/30/2021   K 3.8 07/30/2021   CL 101 07/21/2021   CO2 28 07/21/2021   GLUCOSE 90 07/21/2021   BUN 12 07/21/2021   CREATININE 0.80 10/27/2021   BILITOT 0.7 07/21/2021   ALKPHOS 61 02/28/2021   AST 37 (H) 07/21/2021   ALT 30 07/21/2021   PROT 7.6 11/10/2021   ALBUMIN 4.3 02/28/2021   CALCIUM 9.8 07/21/2021   GFRAA 106 09/26/2020    Speciality Comments: No specialty comments available.  Procedures:  No procedures performed Allergies: Gabapentin, Latex, and Penicillins   Assessment / Plan:     Visit Diagnoses: Elevated CK  Persistent elevated CK with myalgias and weakness negative myositis work-up.  He has had multiple EMG studies apparently demonstrating degrees of neurologic injury.  My other thought in getting at definitive cause of muscle weakness at this point would be biopsy for evaluation will try and get input from his neurology or neurosurgery team.  Pica  No evidence of iron deficiency or anemia to explain pica symptoms. His weight loss and GI symptoms may represent some nutritional deficiency component but nothing on labs so far.   Orders: No orders of the defined types were placed in this  encounter.  No orders of the defined types were placed in this encounter.    Follow-Up Instructions: No follow-ups on file.   Collier Salina, MD  Note - This record has been created using [employer].  Chart creation errors have been sought, but may not always  have been located. Such creation errors do not reflect on  the standard of medical care.

## 2021-12-08 ENCOUNTER — Ambulatory Visit: Payer: Medicaid Other | Admitting: Internal Medicine

## 2021-12-08 ENCOUNTER — Encounter: Payer: Self-pay | Admitting: Internal Medicine

## 2021-12-08 ENCOUNTER — Other Ambulatory Visit: Payer: Self-pay

## 2021-12-08 VITALS — BP 124/83 | HR 71 | Resp 20 | Ht 71.0 in | Wt 210.0 lb

## 2021-12-08 DIAGNOSIS — R748 Abnormal levels of other serum enzymes: Secondary | ICD-10-CM | POA: Diagnosis not present

## 2021-12-08 DIAGNOSIS — F5089 Other specified eating disorder: Secondary | ICD-10-CM

## 2021-12-15 ENCOUNTER — Telehealth: Payer: Self-pay | Admitting: Internal Medicine

## 2021-12-15 NOTE — Telephone Encounter (Signed)
Patient called the office stating he had an appointment last week (12-08-21) and stated that at that appointment Dr. Benjamine Mola had mentioned surgery. Patient states he has questions and requests a call back.

## 2021-12-16 ENCOUNTER — Telehealth: Payer: Self-pay | Admitting: *Deleted

## 2021-12-16 NOTE — Telephone Encounter (Signed)
Contacted patient prior to his NP apt with Dr. Tasia Catchings tomorrow. Directions to the clinic provided to the patient. Patient inquired if valet services were available. Explained to patient that this is a complementary service to the patient. He thanked me for calling him prior to his apt. He confirmed that he would be at this apt to discuss his (60 lb) unxplained weight loss with Dr. Tasia Catchings.

## 2021-12-17 ENCOUNTER — Other Ambulatory Visit: Payer: Self-pay

## 2021-12-17 ENCOUNTER — Inpatient Hospital Stay: Payer: Medicaid Other | Attending: Oncology | Admitting: Oncology

## 2021-12-17 ENCOUNTER — Inpatient Hospital Stay: Payer: Medicaid Other

## 2021-12-17 ENCOUNTER — Encounter: Payer: Self-pay | Admitting: Oncology

## 2021-12-17 VITALS — BP 120/4 | HR 93 | Temp 97.5°F | Resp 20 | Wt 206.0 lb

## 2021-12-17 DIAGNOSIS — E785 Hyperlipidemia, unspecified: Secondary | ICD-10-CM | POA: Insufficient documentation

## 2021-12-17 DIAGNOSIS — Z8349 Family history of other endocrine, nutritional and metabolic diseases: Secondary | ICD-10-CM

## 2021-12-17 DIAGNOSIS — Z8249 Family history of ischemic heart disease and other diseases of the circulatory system: Secondary | ICD-10-CM

## 2021-12-17 DIAGNOSIS — Z818 Family history of other mental and behavioral disorders: Secondary | ICD-10-CM

## 2021-12-17 DIAGNOSIS — E669 Obesity, unspecified: Secondary | ICD-10-CM

## 2021-12-17 DIAGNOSIS — Z808 Family history of malignant neoplasm of other organs or systems: Secondary | ICD-10-CM | POA: Insufficient documentation

## 2021-12-17 DIAGNOSIS — E119 Type 2 diabetes mellitus without complications: Secondary | ICD-10-CM | POA: Insufficient documentation

## 2021-12-17 DIAGNOSIS — Z809 Family history of malignant neoplasm, unspecified: Secondary | ICD-10-CM | POA: Diagnosis not present

## 2021-12-17 DIAGNOSIS — Z803 Family history of malignant neoplasm of breast: Secondary | ICD-10-CM | POA: Insufficient documentation

## 2021-12-17 DIAGNOSIS — R748 Abnormal levels of other serum enzymes: Secondary | ICD-10-CM | POA: Diagnosis not present

## 2021-12-17 DIAGNOSIS — R634 Abnormal weight loss: Secondary | ICD-10-CM | POA: Diagnosis not present

## 2021-12-17 DIAGNOSIS — Z79899 Other long term (current) drug therapy: Secondary | ICD-10-CM | POA: Diagnosis not present

## 2021-12-17 DIAGNOSIS — Z8042 Family history of malignant neoplasm of prostate: Secondary | ICD-10-CM | POA: Diagnosis not present

## 2021-12-17 DIAGNOSIS — Z8043 Family history of malignant neoplasm of testis: Secondary | ICD-10-CM | POA: Diagnosis not present

## 2021-12-17 DIAGNOSIS — R5383 Other fatigue: Secondary | ICD-10-CM

## 2021-12-17 DIAGNOSIS — Z801 Family history of malignant neoplasm of trachea, bronchus and lung: Secondary | ICD-10-CM | POA: Insufficient documentation

## 2021-12-17 DIAGNOSIS — Z87891 Personal history of nicotine dependence: Secondary | ICD-10-CM | POA: Insufficient documentation

## 2021-12-17 NOTE — Progress Notes (Signed)
Patient here to establish care. Pt reports that he has lost abou 60 pounds this year, including muscle tone. His appetite has decreased.

## 2021-12-17 NOTE — Progress Notes (Signed)
Hematology/Oncology Consult note [phone] [phone]      Patient Care Team: Steele Sizer, MD as PCP - General Wellington Hampshire, MD as PCP - Cardiology (Cardiology)  REFERRING PROVIDER: Lin Landsman, MD  CHIEF COMPLAINTS/REASON FOR VISIT:  Evaluation of unintentional weight loss  HISTORY OF PRESENTING ILLNESS:   [name] [name] is a  60 y.o.  male with PMH listed below was seen in consultation at the request of  Lin Landsman, MD  for evaluation of unintentional weight loss  Patient reports that 1 year ago, he weighed about 262 pounds.  Now he weighs 206 pounds.  He has lost a lot of weight.  Also he feels sweaty after walking around.  Denies any fever. Patient reports ongoing worsening loss, his work-up showed mildly elevated total CK.  Patient has been seen by rheumatologist Dr. Benjamine Mola in [place] for work-up of possible diagnosis of myositis. 09/10/2021, patient has had upper endoscopy done which showed a normal duodenal bulb and second portion of the duodenum.  Normal stomach.  Esophogastric landmarks identified.  Normal gastroesophageal junction and esophagus.  Dilated biopsy.  Pathology of the biopsy was negative for dysplasia or malignancy, stratified squamous epithelium without eosinophils, neutrophils or reactive changes. Colonoscopy was attempted but aborted due to poor prep.  Gastric emptying study normal.  It was felt that his unintentional weight loss and generalized weakness are still unexplained after GI work-up and the patient was sent to hematology oncology for further evaluation. Patient has normal thyroid panel.  10/22/2021 US thyroid showed no suspicious thyroid nodules 10/27/2021, CT chest with contrast showed no acute cardiopulmonary abnormalities.  No mass or adenopathy identified.  CAD.  Morphologic features of the liver which may be seen early cirrhosis. 11/26/2021, CT abdomen pelvis with contrast showed no acute findings in  abdomen or pelvis.  No explanation for patient's nausea and vomiting.  No mass or adenopathy identified within abdomen or pelvis.  Aortic atherosclerosis.  Patient has diabetes and has been on Trulicity for more than a year.  Patient also is on Zetia for hyperlipidemia  Review of Systems  Constitutional:  Positive for fatigue and unexpected weight change. Negative for chills, diaphoresis and fever.  HENT:   Negative for hearing loss, lump/mass, nosebleeds and sore throat.   Eyes:  Negative for eye problems and icterus.  Respiratory:  Negative for chest tightness, cough, hemoptysis, shortness of breath and wheezing.   Cardiovascular:  Negative for chest pain and leg swelling.  Gastrointestinal:  Negative for abdominal distention, abdominal pain, blood in stool, diarrhea, nausea and rectal pain.  Endocrine: Negative for hot flashes.  Genitourinary:  Negative for bladder incontinence, difficulty urinating, dysuria, frequency, hematuria and nocturia.   Musculoskeletal:  Negative for back pain, flank pain, gait problem and myalgias.  Skin:  Negative for rash.  Neurological:  Negative for dizziness, gait problem, headaches, numbness and seizures.  Hematological:  Negative for adenopathy. Does not bruise/bleed easily.  Psychiatric/Behavioral:  Negative for confusion and decreased concentration. The patient is not nervous/anxious.    MEDICAL HISTORY:  Past Medical History:  Diagnosis Date   Allergy    Angina, class III (HCC)    Chronic constipation    Degeneration of intervertebral disc of cervical region    Diabetes mellitus without complication (HCC)    Diastolic dysfunction    a. 06/2017 Echo: EF 60-65%, no rwma, Gr1 DD.   Dyspnea    GERD (gastroesophageal reflux disease)    Hernia 1991   02/10/2012-RIH repair  Hyperlipidemia    Hypertension 2008   Nerve root pain    Neuropathy    Non-obstructive CAD (coronary artery disease)    a. 05/30/2015 cath: LM nl, mLAD 50% (FFR 0.83), LCx minor  irregs, RCA minor irregs, EF 55-65%-->Med Rx; b. 03/2016 Cath: LM nl LAD 44m D1/2/3 min irregs, LCX min irregs, OM1/2/3 nl, RCA min irregs, RPDA/RPAV/RPL1/RPL2 nl-->Med Rx; b. 07/2020 Cath: LM nl, LAD 329mD1/2/3 min irregs, LCX min irregs, RCA min irregs, RPDA/RPAV/RPL1,2 nl, EF 55-65%.   Obesity, unspecified 2012   Personal history of tobacco use, presenting hazards to health 2012   Polycythemia    Rectal bleeding 02/11/2017   Recurrent Right Inguinal Hernia Repair 02/09/2011, [facility]07[facility] Shortness of breath    Sleep apnea    a. On CPAP;  b. 06/2017 Echo: no PAH.   Umbilical hernia without mention of obstruction or gangrene 02/09/2011   [facility] Vitamin D deficiency     SURGICAL HISTORY: Past Surgical History:  Procedure Laterality Date   BACK SURGERY  12/2008   [name] CARDIAC CATHETERIZATION N/A 05/30/2015   Procedure: Left Heart Cath;  Surgeon: MuWellington HampshireMD;  [place] [facility];  Service: Cardiovascular;  Laterality: N/A;   CARDIAC CATHETERIZATION N/A 04/16/2016   Procedure: Left Heart Cath and Coronary Angiography;  Surgeon: MuWellington HampshireMD;  [place];  Service: Cardiovascular;  Laterality: N/A;   COLONOSCOPY  2014   Dr. SaJamal Collin COLONOSCOPY WITH PROPOFOL N/A 12/25/2016   Procedure: COLONOSCOPY WITH PROPOFOL;  Surgeon: KiJonathon BellowsMD;  Location: [facility] ENDOSCOPY;  Service: Endoscopy;  Laterality: N/A;   COLONOSCOPY WITH PROPOFOL N/A 01/29/2017   Procedure: COLONOSCOPY WITH PROPOFOL;  Surgeon: KiJonathon BellowsMD;  Location: [facility] ENDOSCOPY;  Service: Endoscopy;  Laterality: N/A;   COLONOSCOPY WITH PROPOFOL N/A 04/11/2020   Procedure: COLONOSCOPY WITH PROPOFOL;  Surgeon: AnJonathon BellowsMD;  [facility];  Service: Gastroenterology;  Laterality: N/A;   ESOPHAGOGASTRODUODENOSCOPY (EGD) WITH PROPOFOL N/A 01/26/2019   Procedure: ESOPHAGOGASTRODUODENOSCOPY (EGD) WITH PROPOFOL;  Surgeon: VaLin LandsmanMD;   [facility];  Service: Gastroenterology;  Laterality: N/A;   ESOPHAGOGASTRODUODENOSCOPY (EGD) WITH PROPOFOL N/A 09/10/2021   Procedure: ESOPHAGOGASTRODUODENOSCOPY (EGD) WITH PROPOFOL;  Surgeon: VaLin LandsmanMD;  [facility];  Service: Gastroenterology;  Laterality: N/A;   EVALUATION UNDER ANESTHESIA WITH HEMORRHOIDECTOMY N/A 02/24/2017   Procedure: EXAM UNDER ANESTHESIA WITH POSSIBLE EXCISION OF INTERNAL HEMORRHOIDS;  Surgeon: JoOlean ReeMD;  Location: [facility] ORS;  Service: General;  Laterality: N/A;   FISSURECTOMY  02/24/2017   Procedure: FISSURECTOMY;  Surgeon: JoOlean ReeMD;  Location: [facility] ORS;  Service: General;;   HAND SURGERY Left 2020   HERNIA REPAIR Right 1991   INGUINAL HERNIA REPAIR Right 2012   Dr SaTeryl LucyERNIA REPAIR Right 2014   Dr SaJamal Collin INTRAVASCULAR PRESSURE WIRE/FFR STUDY N/A 07/30/2021   Procedure: INTRAVASCULAR PRESSURE WIRE/FFR STUDY;  Surgeon: ArWellington HampshireMD;  [place] [facility] [place];  Service: Cardiovascular;  Laterality: N/A;   LEFT HEART CATH AND CORONARY ANGIOGRAPHY Left 08/05/2020   Procedure: LEFT HEART CATH AND CORONARY ANGIOGRAPHY poss PCI;  Surgeon: ArWellington HampshireMD;  [place] [facility];  Service: Cardiovascular;  Laterality: Left;   RIGHT/LEFT HEART CATH AND CORONARY ANGIOGRAPHY N/A 07/30/2021   Procedure: RIGHT/LEFT HEART CATH AND CORONARY ANGIOGRAPHY;  Surgeon: ArWellington HampshireMD;  Location:  [place] INV[facility]IVE CV LAB;  Service: Cardiovascular;  Laterality: N/A;    SOCIAL HISTORY: Social History   Socioeconomic History   Marital status: Single    Spouse name: Not on file   Number of children: Not on file   Years of education: Not on file   Highest education level: Not on file  Occupational History   Not on file  Tobacco Use   Smoking status: Former    Types: Cigars    Start date: 12/28/2002    Quit date: 06/04/2005    Years since quitting: 16.5   Smokeless tobacco: Never   Tobacco  comments:    quit 2006-smoked 1 cigar occ  Vaping Use   Vaping Use: Never used  Substance and Sexual Activity   Alcohol use: Not Currently    Comment: has not had a drink in 8 months   Drug use: No   Sexual activity: Yes    Partners: Female  Other Topics Concern   Not on file  Social History Narrative   Not on file   Social Determinants of Health   Financial Resource Strain: Not on file  Food Insecurity: Not on file  Transportation Needs: Not on file  Physical Activity: Not on file  Stress: Not on file  Social Connections: Not on file  Intimate Partner Violence: Not on file    FAMILY HISTORY: Family History  Problem Relation Age of Onset   Hypertension Mother    Heart Problems Mother    High Cholesterol Mother    Heart Problems Father 40       myocardial infarction   Mental illness Brother    Other Maternal Aunt        COVID   Lung cancer Maternal Aunt    Lung cancer Maternal Aunt    Cancer Maternal Uncle        unknown   Prostate cancer Maternal Uncle    Testicular cancer Paternal Uncle    Thyroid disease Daughter    Breast cancer Cousin     ALLERGIES:  is allergic to gabapentin, latex, and penicillins.  MEDICATIONS:  Current Outpatient Medications  Medication Sig Dispense Refill   ACCU-CHEK GUIDE test strip USE AS DIRECTED TO TEST BLOOD SUGAR THREE TIMES DAILY AS NEEDED 100 strip 1   Accu-Chek Softclix Lancets lancets TEST TWICE DAILY 100 each 3   aspirin 81 MG tablet Take 81 mg by mouth daily.     blood glucose meter kit and supplies 1 each by Other route in the morning and at bedtime. Dispense based on patient and insurance preference. Use up to two  times daily. (FOR ICD-10 E10.9, E11.9). 1 each 0   carvedilol (COREG) 6.25 MG tablet Take 1 tablet (6.25 mg total) by mouth 2 (two) times daily. 180 tablet 1   cetirizine (ZYRTEC) 10 MG tablet Take 10 mg by mouth daily as needed for allergies.   1   Dulaglutide (TRULICITY) 4.00 QQ/7.6PP SOPN Inject 0.75 mg  into the skin once a week. Monday     ezetimibe (ZETIA) 10 MG tablet Take 1 tablet (10 mg total) by mouth daily. 90 tablet 1   ketoconazole (NIZORAL) 2 % shampoo Apply 1 application topically 3 (three) times a week. Wash scalp/body 3 times a week as needed for alres, let sit 5 minutes before rinsing off, avoid eyes. May use once monthly for maintenance. 120 mL 11   losartan-hydrochlorothiazide (HYZAAR) 50-12.5 MG tablet Take 1 tablet by mouth daily.     lubiprostone (  AMITIZA) 24 MCG capsule Take 1 capsule (24 mcg total) by mouth 2 (two) times daily with a meal.     mupirocin ointment (BACTROBAN) 2 % Apply 1 application topically daily. Qd to excision site 22 g 1   omeprazole (PRILOSEC) 40 MG capsule TAKE 1 CAPSULE(40 MG) BY MOUTH TWICE DAILY BEFORE A MEAL 60 capsule 2   pregabalin (LYRICA) 50 MG capsule Take 1 capsule (50 mg total) by mouth 3 (three) times daily. 270 capsule 1   ranolazine (RANEXA) 1000 MG SR tablet Take 1 tablet (1,000 mg total) by mouth 2 (two) times daily. 180 tablet 3   sildenafil (REVATIO) 20 MG tablet Take by mouth.     nitroGLYCERIN (NITROSTAT) 0.4 MG SL tablet Place 1 tablet (0.4 mg total) under the tongue every 5 (five) minutes as needed for chest pain. (Patient not taking: Reported on 12/17/2021)     No current facility-administered medications for this visit.     PHYSICAL EXAMINATION: ECOG PERFORMANCE STATUS: 1 - Symptomatic but completely ambulatory Vitals:   12/17/21 0959  BP: (!) 120/4  Pulse: 93  Resp: 20  Temp: (!) 97.5 F (36.4 C)  SpO2: 97%   Filed Weights   12/17/21 0959  Weight: 206 lb (93.4 kg)    Physical Exam Constitutional:      General: He is not in acute distress.    Appearance: He is obese.  HENT:     Head: Normocephalic and atraumatic.  Eyes:     General: No scleral icterus. Cardiovascular:     Rate and Rhythm: Normal rate and regular rhythm.     Heart sounds: Normal heart sounds.  Pulmonary:     Effort: Pulmonary effort is  normal. No respiratory distress.     Breath sounds: No wheezing.  Abdominal:     General: Bowel sounds are normal. There is no distension.     Palpations: Abdomen is soft.  Musculoskeletal:        General: No deformity. Normal range of motion.     Cervical back: Normal range of motion and neck supple.  Skin:    General: Skin is warm and dry.     Findings: No erythema or rash.  Neurological:     Mental Status: He is alert and oriented to person, place, and time. Mental status is at baseline.     Cranial Nerves: No cranial nerve deficit.     Coordination: Coordination normal.  Psychiatric:        Mood and Affect: Mood normal.    LABORATORY DATA:  I have reviewed the data as listed Lab Results  Component Value Date   WBC 8.0 07/21/2021   HGB 16.3 07/30/2021   HCT 48.0 07/30/2021   MCV 95.2 07/21/2021   PLT 226 07/21/2021   Recent Labs    02/28/21 1259 07/21/21 0852 07/30/21 1550 07/30/21 1551 10/27/21 0903 11/10/21 0940  NA 140 140 139 139  --   --   K 4.1 4.4 3.9 3.8  --   --   CL 102 101  --   --   --   --   CO2 23 28  --   --   --   --   GLUCOSE 88 90  --   --   --   --   BUN 10 12  --   --   --   --   CREATININE 0.82 0.85  --   --  0.80  --   CALCIUM 9.3 9.8  --   --   --   --  GFRNONAA >60  --   --   --   --   --   PROT 7.9 7.5  --   --   --  7.6  ALBUMIN 4.3  --   --   --   --   --   AST 57* 37*  --   --   --   --   ALT 47* 30  --   --   --   --   ALKPHOS 61  --   --   --   --   --   BILITOT 1.0 0.7  --   --   --   --    Iron/TIBC/Ferritin/ %Sat    Component Value Date/Time   IRON 125 11/10/2021 0940   TIBC 368 11/10/2021 0940   FERRITIN 228 11/10/2021 0940   FERRITIN 641 (H) 09/05/2015 1131   IRONPCTSAT 34 11/10/2021 0940      RADIOGRAPHIC STUDIES: I have personally reviewed the radiological images as listed and agreed with the findings in the report. NM Gastric Emptying  Result Date: 11/28/2021 CLINICAL DATA:  Nausea and vomiting, concern for  gastroparesis EXAM: NUCLEAR MEDICINE GASTRIC EMPTYING SCAN TECHNIQUE: After oral ingestion of radiolabeled meal, sequential abdominal images were obtained for 3 hours. Percentage of activity emptying the stomach was calculated at 1 hour, 2 hour, and 3 hours. RADIOPHARMACEUTICALS:  2.23 mCi Tc-72msulfur colloid in standardized meal COMPARISON:  CT November 26, 2021 FINDINGS: Expected location of the stomach in the left upper quadrant. Ingested meal empties the stomach gradually over the course of the study. 43% emptied at 1 hr ( normal >= 10%) 83% emptied at 2 hr ( normal >= 40%) 94% emptied at 3 hr ( normal >= 70%) IMPRESSION: Normal gastric emptying study. Electronically Signed   By: JDahlia BailiffM.D.   On: 11/28/2021 23:32   CT ABDOMEN PELVIS W CONTRAST  Result Date: 11/26/2021 CLINICAL DATA:  Nausea/vomiting with a 60-pound weight loss over 1 year. EXAM: CT ABDOMEN AND PELVIS WITH CONTRAST TECHNIQUE: Multidetector CT imaging of the abdomen and pelvis was performed using the standard protocol following bolus administration of intravenous contrast. CONTRAST:  1079mOMNIPAQUE IOHEXOL 300 MG/ML  SOLN COMPARISON:  CT chest 10/27/2021; CT abdomen and pelvis 03/04/2018. FINDINGS: Lower chest: Small clustered calcified nodules are identified within the posterior and lateral right lung base, likely postinflammatory. No acute abnormality. Hepatobiliary: No focal liver abnormality is seen. No gallstones, gallbladder wall thickening, or biliary dilatation. Pancreas: Unremarkable. No pancreatic ductal dilatation or surrounding inflammatory changes. Spleen: Normal in size without focal abnormality. Adrenals/Urinary Tract: Normal adrenal glands. No nephrolithiasis, hydronephrosis, or mass. The urinary bladder is unremarkable. Stomach/Bowel: Stomach appears normal. The appendix is visualized and is within normal limits. No bowel wall thickening, inflammation or distension. Vascular/Lymphatic: Aortic atherosclerosis.  There is no aneurysm. No abdominopelvic adenopathy. Reproductive: Prostate is unremarkable. Other: No free fluid or fluid collections within the abdomen or pelvis. Postsurgical changes from right inguinal herniorrhaphy are identified and appears similar to 03/04/2018. Musculoskeletal: Status post posterior hardware fixation and interbody fusion at L4-5. No acute or suspicious osseous findings. IMPRESSION: 1. No acute findings within the abdomen or pelvis. No explanation for patient's nausea and vomiting. 2. No mass or adenopathy identified within the abdomen or pelvis. 3. Aortic atherosclerosis (ICD10-I70.0). Electronically Signed   By: TaKerby Moors.D.   On: 11/26/2021 16:58      ASSESSMENT & PLAN:  1. Unintentional weight loss   2. Elevated CK    #  Unintentional weight loss, He has had massive weight loss, 60 pounds over the past 1 year.  CT chest abdomen pelvis did not see any mass or lymphadenopathy.  Less likely malignancy induced. Reviewed patient's medication list. He has been on Trulicity for diabetes.  Trulicity may contribute to his weight loss.  I urged patient to further discuss with primary care provider for  Elevated CK, patient is getting work-up with rheumatologist for possible myositis.  Patient is also on Zetia which may contribute to that.   Patient appreciated explanation.  I do not think he needs to follow-up with oncology clinic at this point.  If he develops new red flag symptoms or new radiographic findings, he is welcome to reestablish care with me for further evaluation.   All questions were answered. The patient knows to call the clinic with any problems questions or concerns.  cc Lin Landsman, MD   Thank you for this kind referral and the opportunity to participate in the care of this patient. A copy of today's note is routed to referring provider   Earlie Server, MD, PhD [facility] Hematology Oncology 12/17/2021

## 2021-12-25 ENCOUNTER — Other Ambulatory Visit: Payer: Self-pay | Admitting: Neurosurgery

## 2021-12-25 ENCOUNTER — Other Ambulatory Visit: Payer: Self-pay | Admitting: Family Medicine

## 2021-12-25 DIAGNOSIS — M6259 Muscle wasting and atrophy, not elsewhere classified, multiple sites: Secondary | ICD-10-CM | POA: Diagnosis not present

## 2021-12-25 DIAGNOSIS — Z01818 Encounter for other preprocedural examination: Secondary | ICD-10-CM

## 2021-12-26 ENCOUNTER — Other Ambulatory Visit: Payer: Self-pay

## 2021-12-26 ENCOUNTER — Encounter
Admission: RE | Admit: 2021-12-26 | Discharge: 2021-12-26 | Disposition: A | Discharge status: Home / Self Care | Payer: Medicaid Other | Source: Ambulatory Visit | Attending: Neurosurgery | Admitting: Neurosurgery

## 2021-12-26 VITALS — BP 133/97 | HR 84 | Resp 17 | Ht 71.0 in | Wt 207.0 lb

## 2021-12-26 DIAGNOSIS — E119 Type 2 diabetes mellitus without complications: Secondary | ICD-10-CM | POA: Diagnosis not present

## 2021-12-26 DIAGNOSIS — E8881 Metabolic syndrome: Secondary | ICD-10-CM | POA: Insufficient documentation

## 2021-12-26 DIAGNOSIS — Z01812 Encounter for preprocedural laboratory examination: Secondary | ICD-10-CM | POA: Insufficient documentation

## 2021-12-26 LAB — BASIC METABOLIC PANEL
Anion gap: 10 (ref 5–15)
BUN: 19 mg/dL (ref 6–20)
CO2: 26 mmol/L (ref 22–32)
Calcium: 9.5 mg/dL (ref 8.9–10.3)
Chloride: 105 mmol/L (ref 98–111)
Creatinine, Ser: 0.8 mg/dL (ref 0.61–1.24)
GFR, Estimated: >60 mL/min (ref >60)
Glucose, Bld: 105 mg/dL — ABNORMAL HIGH (ref 70–99)
Potassium: 3.6 mmol/L (ref 3.5–5.1)
Sodium: 141 mmol/L (ref 135–145)

## 2021-12-26 LAB — CBC
HCT: 53.2 % — ABNORMAL HIGH (ref 39.0–52.0)
Hemoglobin: 17.8 g/dL — ABNORMAL HIGH (ref 13.0–17.0)
MCH: 30.6 pg (ref 26.0–34.0)
MCHC: 33.5 g/dL (ref 30.0–36.0)
MCV: 91.6 fL (ref 80.0–100.0)
Platelets: 243 10*3/uL (ref 150–400)
RBC: 5.81 MIL/uL (ref 4.22–5.81)
RDW: 13.7 % (ref 11.5–15.5)
WBC: 10.4 10*3/uL (ref 4.0–10.5)
nRBC: 0 % (ref 0.0–0.2)

## 2021-12-26 NOTE — Patient Instructions (Signed)
Your procedure is scheduled on: 12/31/21 Report to [facility]. To find out your arrival time please call [phone] between 1PM - 3PM on 12/30/21.  Remember: Instructions that are not followed completely may result in serious medical risk, up to and including death, or upon the discretion of your surgeon and anesthesiologist your surgery may need to be rescheduled.     _X__ 1. Do not eat food after midnight the night before your procedure.                 No gum chewing or hard candies. You may drink clear liquids up to 2 hours                 before you are scheduled to arrive for your surgery- DO not drink clear                 liquids within 2 hours of the start of your surgery.                 Clear Liquids include:  water, apple juice without pulp, clear carbohydrate                 drink such as Clearfast or Gatorade, Black Coffee or Tea (Do not add                 anything to coffee or tea). Diabetics water only  __X__2.  On the morning of surgery brush your teeth with toothpaste and water, you                 may rinse your mouth with mouthwash if you wish.  Do not swallow any              toothpaste of mouthwash.     _X__ 3.  No Alcohol for 24 hours before or after surgery.   _X__ 4.  Do Not Smoke or use e-cigarettes For 24 Hours Prior to Your Surgery.                 Do not use any chewable tobacco products for at least 6 hours prior to                 surgery.  ____  5.  Bring all medications with you on the day of surgery if instructed.   __X__  6.  Notify your doctor if there is any change in your medical condition      (cold, fever, infections).     Do not wear jewelry, make-up, hairpins, clips or nail polish. Do not wear lotions, powders, or perfumes.  Do not shave body hair 48 hours prior to surgery. Men may shave face and neck. Do not bring valuables to the hospital.    [facility] is not responsible for any  belongings or valuables.  Contacts, dentures/partials or body piercings may not be worn into surgery. Bring a case for your contacts, glasses or hearing aids, a denture cup will be supplied. Leave your suitcase in the car. After surgery it may be brought to your room. For patients admitted to the hospital, discharge time is determined by your treatment team.   Patients discharged the day of surgery will not be allowed to drive home.   Please read over the following fact sheets that you were given:   CHG soap  __X__ Take these medicines the morning of surgery with A SIP OF WATER:  1. carvedilol (COREG) 6.25 MG tablet  2. ezetimibe (ZETIA) 10 MG tablet  3. omeprazole (PRILOSEC) 40 MG capsule  4. pregabalin (LYRICA) 50 MG capsule  5. ranolazine (RANEXA) 1000 MG SR tablet  6.  ____ Fleet Enema (as directed)   __X__ Use CHG Soap/SAGE wipes as directed  ____ Use inhalers on the day of surgery  ____ Stop metformin/Janumet/Farxiga 2 days prior to surgery    ____ Take 1/2 of usual insulin dose the night before surgery. No insulin the morning          of surgery.   ____ Stop Blood Thinners Coumadin/Plavix/Xarelto/Pleta/Pradaxa/Eliquis/Effient/Aspirin  on   Or contact your Surgeon, Cardiologist or Medical Doctor regarding  ability to stop your blood thinners  __X__ Stop Anti-inflammatories 7 days before surgery such as Advil, Ibuprofen, Motrin,  BC or Goodies Powder, Naprosyn, Naproxen, Aleve, Aspirin    __X__ Stop all herbal supplements, fish oil or vitamin E until after surgery.    ____ Bring C-Pap to the hospital.

## 2021-12-30 ENCOUNTER — Other Ambulatory Visit: Payer: Self-pay | Admitting: Cardiovascular Disease

## 2021-12-30 NOTE — Telephone Encounter (Signed)
Please contact pt for future appointment. Pt overdue for 3 month f/u. Pt needing refills.

## 2021-12-31 ENCOUNTER — Ambulatory Visit: Payer: Medicaid Other | Admitting: Urgent Care

## 2021-12-31 ENCOUNTER — Encounter: Payer: Self-pay | Admitting: Neurosurgery

## 2021-12-31 ENCOUNTER — Ambulatory Visit: Payer: Medicaid Other | Admitting: Registered Nurse

## 2021-12-31 ENCOUNTER — Encounter
Admission: RE | Disposition: A | Discharge status: Home / Self Care | Payer: Self-pay | Source: Home / Self Care | Attending: Neurosurgery

## 2021-12-31 ENCOUNTER — Ambulatory Visit
Admission: RE | Admit: 2021-12-31 | Discharge: 2021-12-31 | Disposition: A | Discharge status: Home / Self Care | Payer: Medicaid Other | Attending: Neurosurgery | Admitting: Neurosurgery

## 2021-12-31 DIAGNOSIS — M6250 Muscle wasting and atrophy, not elsewhere classified, unspecified site: Secondary | ICD-10-CM | POA: Insufficient documentation

## 2021-12-31 DIAGNOSIS — M6259 Muscle wasting and atrophy, not elsewhere classified, multiple sites: Secondary | ICD-10-CM

## 2021-12-31 DIAGNOSIS — K219 Gastro-esophageal reflux disease without esophagitis: Secondary | ICD-10-CM | POA: Diagnosis not present

## 2021-12-31 DIAGNOSIS — R748 Abnormal levels of other serum enzymes: Secondary | ICD-10-CM

## 2021-12-31 HISTORY — PX: MUSCLE BIOPSY: SHX716

## 2021-12-31 LAB — GLUCOSE, CAPILLARY
Glucose-Capillary: 113 mg/dL — ABNORMAL HIGH (ref 70–99)
Glucose-Capillary: 97 mg/dL (ref 70–99)

## 2021-12-31 SURGERY — BIOPSY, MUSCLE, GASTROCNEMIUS
Anesthesia: General | Site: Leg Upper | Laterality: Left

## 2021-12-31 MED ORDER — LIDOCAINE HCL (PF) 2 % IJ SOLN
INTRAMUSCULAR | Status: AC
  Filled 2021-12-31: qty 5

## 2021-12-31 MED ORDER — CEFAZOLIN SODIUM-DEXTROSE 2-4 GM/100ML-% IV SOLN
INTRAVENOUS | Status: AC
  Filled 2021-12-31: qty 100

## 2021-12-31 MED ORDER — SODIUM CHLORIDE (PF) 0.9 % IJ SOLN
INTRAMUSCULAR | Status: AC
  Filled 2021-12-31: qty 50

## 2021-12-31 MED ORDER — ACETAMINOPHEN 10 MG/ML IV SOLN: 1000.0000 mg | Freq: Once | INTRAVENOUS | Status: DC | PRN

## 2021-12-31 MED ORDER — PROPOFOL 10 MG/ML IV BOLUS
INTRAVENOUS | Status: AC
  Filled 2021-12-31: qty 40

## 2021-12-31 MED ORDER — 0.9 % SODIUM CHLORIDE (POUR BTL) OPTIME
TOPICAL | Status: DC | PRN
  Administered 2021-12-31: 1000 mL

## 2021-12-31 MED ORDER — DEXMEDETOMIDINE HCL IN NACL 200 MCG/50ML IV SOLN
INTRAVENOUS | Status: DC | PRN
  Administered 2021-12-31 (×3): 4 ug via INTRAVENOUS

## 2021-12-31 MED ORDER — TRAMADOL HCL 50 MG PO TABS: 50.0000 mg | ORAL_TABLET | Freq: Four times a day (QID) | ORAL | 0 refills | Status: AC | PRN

## 2021-12-31 MED ORDER — CHLORHEXIDINE GLUCONATE 0.12 % MT SOLN
OROMUCOSAL | Status: AC
  Filled 2021-12-31: qty 15

## 2021-12-31 MED ORDER — LIDOCAINE HCL (PF) 1 % IJ SOLN
INTRAMUSCULAR | Status: AC
  Filled 2021-12-31: qty 30

## 2021-12-31 MED ORDER — OXYCODONE HCL 5 MG PO TABS: 5.0000 mg | ORAL_TABLET | Freq: Once | ORAL | Status: DC | PRN

## 2021-12-31 MED ORDER — BUPIVACAINE HCL (PF) 0.5 % IJ SOLN
INTRAMUSCULAR | Status: DC | PRN
  Administered 2021-12-31: 30 mL

## 2021-12-31 MED ORDER — PROPOFOL 10 MG/ML IV BOLUS
INTRAVENOUS | Status: AC
  Filled 2021-12-31: qty 20

## 2021-12-31 MED ORDER — BUPIVACAINE HCL (PF) 0.5 % IJ SOLN
INTRAMUSCULAR | Status: AC
  Filled 2021-12-31: qty 60

## 2021-12-31 MED ORDER — CEFAZOLIN SODIUM-DEXTROSE 2-3 GM-%(50ML) IV SOLR
INTRAVENOUS | Status: DC | PRN
  Administered 2021-12-31: 2 g via INTRAVENOUS

## 2021-12-31 MED ORDER — ONDANSETRON HCL 4 MG/2ML IJ SOLN: 4.0000 mg | Freq: Once | INTRAMUSCULAR | Status: DC | PRN

## 2021-12-31 MED ORDER — PROPOFOL 10 MG/ML IV BOLUS
INTRAVENOUS | Status: DC | PRN
  Administered 2021-12-31: 70 mg via INTRAVENOUS
  Administered 2021-12-31: 30 mg via INTRAVENOUS

## 2021-12-31 MED ORDER — SODIUM CHLORIDE 0.9 % IV SOLN: INTRAVENOUS | Status: DC

## 2021-12-31 MED ORDER — BUPIVACAINE-EPINEPHRINE (PF) 0.5% -1:200000 IJ SOLN
INTRAMUSCULAR | Status: AC
  Filled 2021-12-31: qty 30

## 2021-12-31 MED ORDER — CHLORHEXIDINE GLUCONATE 0.12 % MT SOLN
15.0000 mL | Freq: Once | OROMUCOSAL | Status: AC
  Administered 2021-12-31: 15 mL via OROMUCOSAL

## 2021-12-31 MED ORDER — ORAL CARE MOUTH RINSE: 15.0000 mL | Freq: Once | OROMUCOSAL | Status: AC

## 2021-12-31 MED ORDER — PROPOFOL 500 MG/50ML IV EMUL
INTRAVENOUS | Status: DC | PRN
  Administered 2021-12-31: 150 ug/kg/min via INTRAVENOUS
  Administered 2021-12-31: 130 ug/kg/min via INTRAVENOUS

## 2021-12-31 MED ORDER — DEXMEDETOMIDINE HCL IN NACL 200 MCG/50ML IV SOLN
INTRAVENOUS | Status: AC
  Filled 2021-12-31: qty 50

## 2021-12-31 MED ORDER — OXYCODONE HCL 5 MG/5ML PO SOLN: 5.0000 mg | Freq: Once | ORAL | Status: DC | PRN

## 2021-12-31 MED ORDER — FENTANYL CITRATE (PF) 100 MCG/2ML IJ SOLN: 25.0000 ug | INTRAMUSCULAR | Status: DC | PRN

## 2021-12-31 SURGICAL SUPPLY — 49 items
ADH SKN CLS APL DERMABOND .7 (GAUZE/BANDAGES/DRESSINGS) ×1
APL PRP STRL LF DISP 70% ISPRP (MISCELLANEOUS) ×2
CHLORAPREP W/TINT 26 (MISCELLANEOUS) ×3 IMPLANT
CNTNR SPEC 2.5X3XGRAD LEK (MISCELLANEOUS) ×1
CONT SPEC 4OZ STER OR WHT (MISCELLANEOUS) ×1
CONT SPEC 4OZ STRL OR WHT (MISCELLANEOUS) ×1
CONTAINER SPEC 2.5X3XGRAD LEK (MISCELLANEOUS) ×1 IMPLANT
COUNTER NEEDLE 20/40 LG (NEEDLE) ×2 IMPLANT
CUP MEDICINE 2OZ PLAST GRAD ST (MISCELLANEOUS) ×2 IMPLANT
DERMABOND ADVANCED (GAUZE/BANDAGES/DRESSINGS) ×1
DERMABOND ADVANCED .7 DNX12 (GAUZE/BANDAGES/DRESSINGS) ×1 IMPLANT
DRAPE C ARM PK CFD 31 SPINE (DRAPES) IMPLANT
DRAPE LAPAROTOMY 100X77 ABD (DRAPES) ×2 IMPLANT
DRAPE POUCH INSTRU U-SHP 10X18 (DRAPES) ×1 IMPLANT
DRAPE SURG 17X11 SM STRL (DRAPES) ×2 IMPLANT
ELECT CAUTERY BLADE TIP 2.5 (TIP)
ELECT REM PT RETURN 9FT ADLT (ELECTROSURGICAL) ×2
ELECTRODE CAUTERY BLDE TIP 2.5 (TIP) IMPLANT
ELECTRODE REM PT RTRN 9FT ADLT (ELECTROSURGICAL) ×1 IMPLANT
GAUZE 4X4 16PLY ~~LOC~~+RFID DBL (SPONGE) ×2 IMPLANT
GLOVE SURG SYN 6.5 ES PF (GLOVE) ×2 IMPLANT
GLOVE SURG SYN 6.5 PF PI (GLOVE) ×1 IMPLANT
GLOVE SURG SYN 8.5  E (GLOVE) ×1
GLOVE SURG SYN 8.5 E (GLOVE) ×1 IMPLANT
GLOVE SURG SYN 8.5 PF PI (GLOVE) ×1 IMPLANT
GLOVE SURG UNDER POLY LF SZ6.5 (GLOVE) ×2 IMPLANT
GOWN SRG LRG LVL 4 IMPRV REINF (GOWNS) ×1 IMPLANT
GOWN SRG XL LVL 3 NONREINFORCE (GOWNS) ×1 IMPLANT
GOWN STRL NON-REIN TWL XL LVL3 (GOWNS) ×2
GOWN STRL REIN LRG LVL4 (GOWNS) ×2
MANIFOLD NEPTUNE II (INSTRUMENTS) ×2 IMPLANT
MARKER SKIN SURG W/RULER VIO (MISCELLANEOUS) ×2 IMPLANT
NDL SAFETY ECLIPSE 18X1.5 (NEEDLE) ×1 IMPLANT
NEEDLE HYPO 18GX1.5 SHARP (NEEDLE) ×2
NEEDLE HYPO 22GX1.5 SAFETY (NEEDLE) ×2 IMPLANT
NS IRRIG 1000ML POUR BTL (IV SOLUTION) ×2 IMPLANT
PACK LAMINECTOMY NEURO (CUSTOM PROCEDURE TRAY) ×1 IMPLANT
PAD ARMBOARD 7.5X6 YLW CONV (MISCELLANEOUS) ×3 IMPLANT
SUT SILK 2 0 (SUTURE) ×2
SUT SILK 2-0 18XBRD TIE 12 (SUTURE) IMPLANT
SUT V-LOC 90 ABS DVC 3-0 CL (SUTURE) ×1 IMPLANT
SUT VIC AB 0 CT1 27 (SUTURE)
SUT VIC AB 0 CT1 27XCR 8 STRN (SUTURE) ×1 IMPLANT
SUT VIC AB 2-0 CT1 18 (SUTURE) ×2 IMPLANT
SUT VIC AB 3-0 SH 8-18 (SUTURE) ×2 IMPLANT
SYR 10ML LL (SYRINGE) ×2 IMPLANT
SYR 20ML LL LF (SYRINGE) ×2 IMPLANT
TOWEL OR 17X26 4PK STRL BLUE (TOWEL DISPOSABLE) ×2 IMPLANT
TUBING CONNECTING 10 (TUBING) ×2 IMPLANT

## 2021-12-31 NOTE — Transfer of Care (Signed)
Immediate Anesthesia Transfer of Care Note  Patient: [name] [name]  Procedure(s) Performed: LEFT VASTUS LATERALIS BIOPSY (Left: Leg Upper)  Patient Location: PACU  Anesthesia Type:General  Level of Consciousness: drowsy and patient cooperative  Airway & Oxygen Therapy: Patient Spontanous Breathing  Post-op Assessment: Report given to RN and Post -op Vital signs reviewed and stable  Post vital signs: Reviewed and stable  Last Vitals:  Vitals Value Taken Time  BP 126/98 12/31/21 0921  Temp    Pulse 75 12/31/21 0921  Resp 19 12/31/21 0921  SpO2 97 % 12/31/21 0921    Last Pain:  Vitals:   12/31/21 0709  PainSc: 7          Complications: No notable events documented.

## 2021-12-31 NOTE — Anesthesia Preprocedure Evaluation (Addendum)
Anesthesia Evaluation  Patient identified by MRN, date of birth, ID band Patient awake  General Assessment Comment:  Patient with recent history of weight loss, shortness of breath, and muscle loss. No prior history of muscular dystrophies or neuromuscular disease, nor any family history. Patient used to walk all the time, played sports before. Has received general anesthetics before without issue. Very low concern for anything which could be triggered by volatiles or succinylcholine.  Reviewed: Allergy & Precautions, NPO status , Patient's Chart, lab work & pertinent test results  History of Anesthesia Complications Negative for: history of anesthetic complications  Airway Mallampati: II  TM Distance: >3 FB Neck ROM: Full    Dental no notable dental hx. (+) Teeth Intact, Implants   Pulmonary shortness of breath and with exertion, sleep apnea and Continuous Positive Airway Pressure Ventilation , neg COPD, Patient abstained from smoking.Not current smoker, former smoker,    Pulmonary exam normal breath sounds clear to auscultation       Cardiovascular Exercise Tolerance: Good METShypertension, + CAD  (-) Past MI (-) dysrhythmias  Rhythm:Regular Rate:Normal - Systolic murmurs 1. Left ventricular ejection fraction, by estimation, is 60 to 65%. The  left ventricle has normal function. The left ventricle has no regional  wall motion abnormalities. There is mild left ventricular hypertrophy.  Left ventricular diastolic parameters  are consistent with Grade I diastolic dysfunction (impaired relaxation).  The average left ventricular global longitudinal strain is -17.4 %. The  global longitudinal strain is normal.  2. Right ventricular systolic function is normal. The right ventricular  size is normal. Tricuspid regurgitation signal is inadequate for assessing  PA pressure.    Neuro/Psych  Headaches, neg Seizures  Neuromuscular disease  negative psych ROS   GI/Hepatic GERD  Medicated and Controlled,(+)     (-) substance abuse  ,   Endo/Other  diabetes  Renal/GU negative Renal ROS     Musculoskeletal  (+) Arthritis ,   Abdominal   Peds  Hematology   Anesthesia Other Findings Past Medical History: No date: Allergy No date: Angina, class III (HCC) No date: Chronic constipation No date: Degeneration of intervertebral disc of cervical region No date: Diabetes mellitus without complication (HCC) No date: Diastolic dysfunction     Comment:  a. 06/2017 Echo: EF 60-65%, no rwma, Gr1 DD. No date: Dyspnea No date: GERD (gastroesophageal reflux disease) 1991: Hernia     Comment:  02/10/2012-RIH repair No date: Hyperlipidemia 2008: Hypertension No date: Nerve root pain No date: Neuropathy No date: Non-obstructive CAD (coronary artery disease)     Comment:  a. 05/30/2015 cath: LM nl, mLAD 50% (FFR 0.83), LCx minor               irregs, RCA minor irregs, EF 55-65%-->Med Rx; b. 03/2016               Cath: LM nl LAD 50m, D1/2/3 min irregs, LCX min irregs,               OM1/2/3 nl, RCA min irregs, RPDA/RPAV/RPL1/RPL2 nl-->Med               Rx; b. 07/2020 Cath: LM nl, LAD 40m, D1/2/3 min irregs,               LCX min irregs, RCA min irregs, RPDA/RPAV/RPL1,2 nl, EF               55-65%. 2012: Obesity, unspecified 2012: Personal history of tobacco use, presenting hazards to health No date:  Polycythemia 02/11/2017: Rectal bleeding 02/09/2011, 04/07/2013: Recurrent Right Inguinal Hernia Repair     Comment:  Dr [name], [facility] No date: Shortness of breath No date: Sleep apnea     Comment:  a. On CPAP;  b. 06/2017 Echo: no PAH. 17/00/1749: Umbilical hernia without mention of obstruction or  gangrene     Comment:  Dr [name], [facility] No date: Vitamin D deficiency  Reproductive/Obstetrics                            Anesthesia Physical Anesthesia Plan  ASA: 3  Anesthesia Plan: General   Post-op Pain  Management: Ofirmev IV (intra-op)   Induction: Intravenous  PONV Risk Score and Plan: 2 and Propofol infusion, TIVA, Ondansetron and Midazolam  Airway Management Planned: Nasal Cannula and Natural Airway  Additional Equipment: None  Intra-op Plan:   Post-operative Plan:   Informed Consent: I have reviewed the patients History and Physical, chart, labs and discussed the procedure including the risks, benefits and alternatives for the proposed anesthesia with the patient or authorized representative who has indicated his/her understanding and acceptance.     Dental advisory given  Plan Discussed with: CRNA and Surgeon  Anesthesia Plan Comments: (Discussed risks of anesthesia with patient, including possibility of difficulty with spontaneous ventilation under anesthesia necessitating airway intervention, PONV, and rare risks such as cardiac or respiratory or neurological events, and allergic reactions. Discussed the role of CRNA in patient's perioperative care. Patient understands.)        Anesthesia Quick Evaluation

## 2021-12-31 NOTE — Op Note (Signed)
Indications: Mr. 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   Preoperative Note:   Risks of surgery discussed include: infection, bleeding, stroke, coma, death, paralysis, CSF leak, nerve/spinal cord injury, numbness, tingling, weakness, complex regional pain syndrome, recurrent stenosis and/or disc herniation, vascular injury, development of instability, neck/back pain, need for further surgery, persistent symptoms, development of deformity, and the risks of anesthesia. The patient understood these risks and agreed to proceed.  Operative Note:  PROCEDURES: 1. Left vastus lateralis muscle biopsy  PROCEDURE IN DETAIL: After obtaining informed consent, the patient taken to the operating room, placed in supine position, and sedated per anesthesia. The left vastus lateralis muscle was palpated, then squared off.  The site was prepped and draped in standard fashion.    Timeout was performed.  A linear incision was opened and the soft tissue divided.  The fascia overlying the vastus lateralis muscle was identified.  This was sharply opened.  The fascia was tacked back.  Muscle was identified.  A 1.5 cm segment of muscle was dissected free, tied off at each end, and divided.  This was handed off as a muscle biopsy.  An additional sample was also given.  At this point, the muscle was irrigated.  A large field block was performed with plain bupivacaine.  Hemostasis was achieved.  We then closed the fascia with 2-0 Vicryl sutures.  The superficial fascia and dermis were closed with 2-0 Vicryl.  A running 3-0 Monocryl was placed on  the skin and then Dermabond used to seal the incision.    All counts were correct.  No complications were noted.    Meade Maw MD

## 2021-12-31 NOTE — H&P (Signed)
I have reviewed and confirmed my history and physical from 12/25/2021 with no additions or changes. Plan for L vastus lateralis biopsy.  Risks and benefits reviewed.  Heart sounds normal no MRG. Chest Clear to Auscultation Bilaterally.

## 2021-12-31 NOTE — Anesthesia Postprocedure Evaluation (Signed)
Anesthesia Post Note  Patient: JARRIEL PAPILLION  Procedure(s) Performed: LEFT VASTUS LATERALIS BIOPSY (Left: Leg Upper)  Patient location during evaluation: PACU Anesthesia Type: General Level of consciousness: awake and alert Pain management: pain level controlled Vital Signs Assessment: post-procedure vital signs reviewed and stable Respiratory status: spontaneous breathing, nonlabored ventilation, respiratory function stable and patient connected to nasal cannula oxygen Cardiovascular status: blood pressure returned to baseline and stable Postop Assessment: no apparent nausea or vomiting Anesthetic complications: no   No notable events documented.   Last Vitals:  Vitals:   12/31/21 0930 12/31/21 0940  BP: 107/70 122/74  Pulse: 70 63  Resp: 12 17  Temp:  (!) 36.1 C  SpO2: 95% 98%    Last Pain:  Vitals:   12/31/21 0940  PainSc: 7                  Arita Miss

## 2021-12-31 NOTE — Discharge Instructions (Addendum)
AMBULATORY SURGERY  DISCHARGE INSTRUCTIONS   The drugs that you were given will stay in your system until tomorrow so for the next 24 hours you should not:  Drive an automobile Make any legal decisions Drink any alcoholic beverage   You may resume regular meals tomorrow.  Today it is better to start with liquids and gradually work up to solid foods.  You may eat anything you prefer, but it is better to start with liquids, then soup and crackers, and gradually work up to solid foods.   Please notify your doctor immediately if you have any unusual bleeding, trouble breathing, redness and pain at the surgery site, drainage, fever, or pain not relieved by medication.    Additional Instructions:  - OK to shower 1 day after surgery, but do not submerge incision in a bath or pool for 3 weeks.  - Contact office at [phone] if you have any issues with wound drainage, fever above 101.5, or other questions or concerns.    Please contact your physician with any problems or Same Day Surgery at [phone], Monday through Friday 6 am to 4 pm, or [facility] number at [phone].

## 2021-12-31 NOTE — Discharge Summary (Signed)
Physician Discharge Summary  Patient ID: [name] [name] MRN: [id number] DOB/AGE: 60-Jan-1962 60 y.o.  Admit date: 12/31/2021 Discharge date: 12/31/2021  Admission Diagnoses: muscle atrophy of multiple sites  Discharge Diagnoses:  Active Problems:   * No active hospital problems. *   Discharged Condition: good  Hospital Course:  [name] [name] was brought into the operating room for muscle biopsy.  He tolerated the procedure well.  After clearing from the PACU and stage 2 recovery, he was discharged home.  Consults: None  Significant Diagnostic Studies: none  Treatments: surgery: L vastus lateralis muscle biopsy  Discharge Exam: Blood pressure 107/70, pulse 70, temperature (!) 96.5 F (35.8 C), resp. rate 12, weight 93.9 kg, SpO2 95 %. General appearance: alert and cooperative Head: Normocephalic, without obvious abnormality, atraumatic MAEW  Disposition: Discharge disposition: 01-Home or Self Care       Discharge Instructions     Discharge patient   Complete by: As directed    Discharge disposition: 01-Home or Self Care   Discharge patient date: 12/31/2021      Allergies as of 12/31/2021       Reactions   Gabapentin Other (See Comments)   Groggy-Mood Changes   Latex Rash   Penicillins Hives, Rash   Has patient had a PCN reaction causing immediate rash, facial/tongue/throat swelling, SOB or lightheadedness with hypotension: Yes Has patient had a PCN reaction causing severe rash involving mucus membranes or skin necrosis: No Has patient had a PCN reaction that required hospitalization No Has patient had a PCN reaction occurring within the last 10 years: No If all of the above answers are "NO", then may proceed with Cephalosporin use.        Medication List     STOP taking these medications    lubiprostone 24 MCG capsule Commonly known as: AMITIZA       TAKE these medications    Accu-Chek Guide test strip Generic drug: glucose blood USE TO TEST BLOOD SUGAR  THREE TIMES DAILY AS NEEDED   Accu-Chek Softclix Lancets lancets TEST TWICE DAILY   atorvastatin 80 MG tablet Commonly known as: LIPITOR Take 80 mg by mouth in the morning.   blood glucose meter kit and supplies 1 each by Other route in the morning and at bedtime. Dispense based on patient and insurance preference. Use up to two  times daily. (FOR ICD-10 E10.9, E11.9).   carvedilol 6.25 MG tablet Commonly known as: COREG Take 1 tablet (6.25 mg total) by mouth 2 (two) times daily.   ezetimibe 10 MG tablet Commonly known as: ZETIA Take 1 tablet (10 mg total) by mouth daily.   fluticasone 50 MCG/ACT nasal spray Commonly known as: FLONASE Place 1 spray into both nostrils daily as needed for allergies or rhinitis.   ketoconazole 2 % shampoo Commonly known as: NIZORAL Apply 1 application topically 3 (three) times a week. Wash scalp/body 3 times a week as needed for alres, let sit 5 minutes before rinsing off, avoid eyes. May use once monthly for maintenance. What changed:  when to take this reasons to take this   losartan-hydrochlorothiazide 50-12.5 MG tablet Commonly known as: HYZAAR Take 1 tablet by mouth daily.   mupirocin ointment 2 % Commonly known as: BACTROBAN Apply 1 application topically daily. Qd to excision site What changed:  when to take this reasons to take this   nitroGLYCERIN 0.4 MG SL tablet Commonly known as: Nitrostat Place 1 tablet (0.4 mg total) under the tongue every 5 (five) minutes as needed  for chest pain.   omeprazole 40 MG capsule Commonly known as: PRILOSEC TAKE 1 CAPSULE(40 MG) BY MOUTH TWICE DAILY BEFORE A MEAL   pregabalin 50 MG capsule Commonly known as: LYRICA Take 1 capsule (50 mg total) by mouth 3 (three) times daily.   ranolazine 1000 MG SR tablet Commonly known as: RANEXA Take 1 tablet (1,000 mg total) by mouth 2 (two) times daily.   sildenafil 20 MG tablet Commonly known as: REVATIO Take 20 mg by mouth daily as needed  (erectile dysfunction.).   traMADol 50 MG tablet Commonly known as: Ultram Take 1 tablet (50 mg total) by mouth every 6 (six) hours as needed for up to 3 days.   Trulicity 1.44 TA/4.6LD Sopn Generic drug: Dulaglutide Inject 0.75 mg into the skin once a week. Monday What changed: when to take this        Follow-up Information     Loleta Dicker, PA Follow up in 2 week(s).   Contact information: [place] [address] [phone]                 Signed: Meade Maw 12/31/2021, 9:33 AM

## 2022-01-04 ENCOUNTER — Other Ambulatory Visit: Payer: Self-pay | Admitting: Family Medicine

## 2022-01-04 NOTE — Telephone Encounter (Signed)
Requested medication (s) are due for refill today: yes  Requested medication (s) are on the active medication list: yes  Last refill:  07/30/21  Future visit scheduled: no  Notes to clinic:  overdue labs   Requested Prescriptions  Pending Prescriptions Disp Refills   TRULICITY 7.09 GG/8.3MO SOPN [Pharmacy Med Name: TRULICITY 0.75MG /0.5ML SDP 0.5ML] 6 mL     Sig: ADMINISTER 0.75 MG UNDER THE SKIN 1 TIME A WEEK     Endocrinology:  Diabetes - GLP-1 Receptor Agonists Passed - 01/04/2022 10:45 AM      Passed - HBA1C is between 0 and 7.9 and within 180 days    Hemoglobin A1C  Date Value Ref Range Status  07/21/2021 5.3 4.0 - 5.6 % Final   HbA1c, POC (controlled diabetic range)  Date Value Ref Range Status  01/29/2020 6.7 0.0 - 7.0 % Final          Passed - Valid encounter within last 6 months    Recent Outpatient Visits           2 months ago B12 deficiency   [facility] [place], Drue Stager, MD   5 months ago Controlled type 2 diabetes mellitus with microalbuminuria, without long-term current use of insulin [facility])   [facility] Steele Sizer, MD   9 months ago Dyslipidemia associated with type 2 diabetes mellitus [facility])   [facility] [place], Drue Stager, MD   10 months ago SOB (shortness of breath)   [facility] [place], Drue Stager, MD   1 year ago Controlled type 2 diabetes mellitus with microalbuminuria, without long-term current use of insulin [facility])   [facility] Steele Sizer, MD       Future Appointments             In 2 weeks Dunn, Areta Haber, PA-C [id number]

## 2022-01-15 NOTE — Progress Notes (Signed)
Cardiology Office Note    Date:  01/15/2022   ID:  [name], [name] 12/06/1961, MRN [id number]  PCP:  Steele Sizer, MD  Cardiologist:  [name], MD  Electrophysiologist:  None   Chief Complaint: Follow up  History of Present Illness:   [name] [name] is a 61 y.o. male with history of nonobstructive CAD, syncope, DM2, HTN, HLD, obesity, and OSA on CPAP who presents for follow up of CAD.  LHC in 2016 showed moderate mid LAD stenosis estimated at 50% with an FFR of 0.83, along with a normal LVEF with normal LVEDP.  Due to worsening angina, he underwent repeat LHC in 03/2016 with noted improvement in mid LAD stenosis estimated at 30% with no evidence of obstructive disease.  He had recurrent episodes of loss of consciousness in 2330 of uncertain etiology.  Echo at that time with normal LVEF. 2-week ZIO monitor with no significant arrhytmia.  He was evaluated by neurology with no clear etiology identified.  It was noted, he did have recurrent concussions while growing up playing sports.  He had recurrent angina 07/2017 with stable cath 1-vessel CAD of 30% stenosis of the mid LAD. EF normal with mildly elevated LVEDP.  For worsening exertional dyspnea and intermittent lightheadedness he underwent Lexiscan MPI in 12/2020, that showed no evidence of significant ischemia and CT attenuation images showing no coronary calcification in LAD and RCA.  Echo 02/14/2021 LVEF 60 to 65%, no wall motion, mild LVH, grade 1 diastolic dysfunction, no significant valvular abnormalities.  He was subsequently referred to pulmonology.  PFTs 04/15/2021 with mild obstructive airway disease without significant bronchodilator response.  CPX in 06/2021 as outlined below.  Subsequent R/LHC on 07/30/2021 showed mild nonobstructive CAD involving the LAD with 10% ostial to mid LAD stenosis and mid LAD stenosis estimated at 30%, with the vessel being mildly calcified. Otherwise, there was no obstructive disease.  RHC showed normal  filling pressures, normal pulmonary pressure, and normal cardiac output.  FFR of the LAD lesion of 0.87 and GFR 1.8.  This was not significant enough to require stenting.  IMR was normal at 22 indicating normal microvascular function.  These cardiac findings did not explain the severity of his symptoms.  He has subsequently been evaluated by neurosurgery, oncology, and GI for muscle atrophy, unintentional weight loss, and dysphagia respectively.  Subsequent muscle biopsy concerning for mitochondrial disorder.  He has been referred to [facility] by neurology.  He comes in doing reasonably well from a cardiac perspective.  He continues to note chronic dyspnea that is largely stable.  He also notes intermittent palpitations that will occur approximately once per week.  These episodes do appear to be associated with an increase in his dyspnea.  No chest pain, dizziness, presyncope, or syncope.  With noted skeletal muscle loss he has become more deconditioned.  He has had several falls since his last visit with [place].  He has follow-up with neurosurgery later this week.  He reports his blood pressure is well controlled at home.  Ambulating with a cane.   Labs independently reviewed: 11/2021 - potassium 3.6, BUN 19, serum creatinine 0.8, Hgb 17.8, PLT 243 09/2021 - TSH normal 06/2021 - albumin 4.5, AST 37, ALT normal, TC 129, TG 79, HDL 56, LDL 57, A1c 5.3  Past Medical History:  Diagnosis Date   Allergy    Angina, class III (HCC)    Chronic constipation    Degeneration of intervertebral disc of cervical region    Diabetes  mellitus without complication ([place])    Diastolic dysfunction    a. 06/2017 Echo: EF 60-65%, no rwma, Gr1 DD.   Dyspnea    GERD (gastroesophageal reflux disease)    Hernia 1991   02/10/2012-RIH repair   Hyperlipidemia    Hypertension 2008   Nerve root pain    Neuropathy    Non-obstructive CAD (coronary artery disease)    a. 05/30/2015 cath: LM nl, mLAD 50% (FFR 0.83), LCx minor irregs, RCA  minor irregs, EF 55-65%-->Med Rx; b. 03/2016 Cath: LM nl LAD 7m, D1/2/3 min irregs, LCX min irregs, OM1/2/3 nl, RCA min irregs, RPDA/RPAV/RPL1/RPL2 nl-->Med Rx; b. 07/2020 Cath: LM nl, LAD 19m, D1/2/3 min irregs, LCX min irregs, RCA min irregs, RPDA/RPAV/RPL1,2 nl, EF 55-65%.   Obesity, unspecified 2012   Personal history of tobacco use, presenting hazards to health 2012   Polycythemia    Rectal bleeding 02/11/2017   Recurrent Right Inguinal Hernia Repair 02/09/2011, 04/07/2013   Dr [name], [facility]   Shortness of breath    Sleep apnea    a. On CPAP;  b. 06/2017 Echo: no PAH.   Umbilical hernia without mention of obstruction or gangrene 02/09/2011   Dr [name], [facility]   Vitamin D deficiency     Past Surgical History:  Procedure Laterality Date   BACK SURGERY  12/2008   [facility]   CARDIAC CATHETERIZATION N/A 05/30/2015   Procedure: Left Heart Cath;  Surgeon: Wellington Hampshire, MD;  Location: [place] [facility];  Service: Cardiovascular;  Laterality: N/A;   CARDIAC CATHETERIZATION N/A 04/16/2016   Procedure: Left Heart Cath and Coronary Angiography;  Surgeon: Wellington Hampshire, MD;  Location: [place] [facility];  Service: Cardiovascular;  Laterality: N/A;   COLONOSCOPY  2014   Dr. [name]   COLONOSCOPY WITH PROPOFOL N/A 12/25/2016   Procedure: COLONOSCOPY WITH PROPOFOL;  Surgeon: Jonathon Bellows, MD;  Location: [facility] ENDOSCOPY;  Service: Endoscopy;  Laterality: N/A;   COLONOSCOPY WITH PROPOFOL N/A 01/29/2017   Procedure: COLONOSCOPY WITH PROPOFOL;  Surgeon: Jonathon Bellows, MD;  Location: [facility] ENDOSCOPY;  Service: Endoscopy;  Laterality: N/A;   COLONOSCOPY WITH PROPOFOL N/A 04/11/2020   Procedure: COLONOSCOPY WITH PROPOFOL;  Surgeon: Jonathon Bellows, MD;  Location: [facility] ENDOSCOPY;  Service: Gastroenterology;  Laterality: N/A;   ESOPHAGOGASTRODUODENOSCOPY (EGD) WITH PROPOFOL N/A 01/26/2019   Procedure: ESOPHAGOGASTRODUODENOSCOPY (EGD) WITH PROPOFOL;  Surgeon: Lin Landsman, MD;  Location: [facility] ENDOSCOPY;   Service: Gastroenterology;  Laterality: N/A;   ESOPHAGOGASTRODUODENOSCOPY (EGD) WITH PROPOFOL N/A 09/10/2021   Procedure: ESOPHAGOGASTRODUODENOSCOPY (EGD) WITH PROPOFOL;  Surgeon: Lin Landsman, MD;  Location: [facility] ENDOSCOPY;  Service: Gastroenterology;  Laterality: N/A;   EVALUATION UNDER ANESTHESIA WITH HEMORRHOIDECTOMY N/A 02/24/2017   Procedure: EXAM UNDER ANESTHESIA WITH POSSIBLE EXCISION OF INTERNAL HEMORRHOIDS;  Surgeon: Olean Ree, MD;  Location: [facility] ORS;  Service: General;  Laterality: N/A;   FISSURECTOMY  02/24/2017   Procedure: FISSURECTOMY;  Surgeon: Olean Ree, MD;  Location: [facility] ORS;  Service: General;;   HAND SURGERY Left 2020   HERNIA REPAIR Right 1991   INGUINAL HERNIA REPAIR Right 2012   Dr Teryl Lucy HERNIA REPAIR Right 2014   Dr [name]   INTRAVASCULAR PRESSURE WIRE/FFR STUDY N/A 07/30/2021   Procedure: INTRAVASCULAR PRESSURE WIRE/FFR STUDY;  Surgeon: Wellington Hampshire, MD;  Location: [place] [facility];  Service: Cardiovascular;  Laterality: N/A;   LEFT HEART CATH AND CORONARY ANGIOGRAPHY Left 08/05/2020   Procedure: LEFT HEART CATH AND CORONARY ANGIOGRAPHY poss PCI;  Surgeon: Wellington Hampshire,  MD;  Location: [place] [facility];  Service: Cardiovascular;  Laterality: Left;   MUSCLE BIOPSY Left 12/31/2021   Procedure: LEFT VASTUS LATERALIS BIOPSY;  Surgeon: Meade Maw, MD;  Location: [facility] ORS;  Service: Neurosurgery;  Laterality: Left;   RIGHT/LEFT HEART CATH AND CORONARY ANGIOGRAPHY N/A 07/30/2021   Procedure: RIGHT/LEFT HEART CATH AND CORONARY ANGIOGRAPHY;  Surgeon: Wellington Hampshire, MD;  Location: [place] [facility];  Service: Cardiovascular;  Laterality: N/A;    Current Medications: No outpatient medications have been marked as taking for the 01/19/22 encounter (Appointment) with Rise Mu, PA-C.    Allergies:   Gabapentin, Latex, and Penicillins   Social History   Socioeconomic History   Marital status: Single    Spouse name: Not on file    Number of children: Not on file   Years of education: Not on file   Highest education level: Not on file  Occupational History   Not on file  Tobacco Use   Smoking status: Former    Types: Cigars    Start date: 12/28/2002    Quit date: 06/04/2005    Years since quitting: 16.6   Smokeless tobacco: Never   Tobacco comments:    quit 2006-smoked 1 cigar occ  Vaping Use   Vaping Use: Never used  Substance and Sexual Activity   Alcohol use: Not Currently    Comment: has not had a drink in 8 months 02/2021   Drug use: No   Sexual activity: Yes    Partners: Female  Other Topics Concern   Not on file  Social History Narrative   Not on file   Social Determinants of Health   Financial Resource Strain: Not on file  Food Insecurity: Not on file  Transportation Needs: Not on file  Physical Activity: Not on file  Stress: Not on file  Social Connections: Not on file     Family History:  The patient's family history includes Breast cancer in his cousin; Cancer in his maternal uncle; Heart Problems in his mother; Heart Problems (age of onset: 38) in his father; High Cholesterol in his mother; Hypertension in his mother; Lung cancer in his maternal aunt and maternal aunt; Mental illness in his brother; Other in his maternal aunt; Prostate cancer in his maternal uncle; Testicular cancer in his paternal uncle; Thyroid disease in his daughter.  ROS:   Review of Systems  Constitutional:  Positive for malaise/fatigue. Negative for chills, diaphoresis, fever and weight loss.  HENT:  Negative for congestion.   Eyes:  Negative for discharge and redness.  Respiratory:  Positive for shortness of breath. Negative for cough, sputum production and wheezing.   Cardiovascular:  Positive for palpitations. Negative for chest pain, orthopnea, claudication, leg swelling and PND.  Gastrointestinal:  Negative for abdominal pain, heartburn, nausea and vomiting.  Musculoskeletal:  Positive for falls. Negative  for myalgias.  Skin:  Negative for rash.  Neurological:  Positive for weakness. Negative for dizziness, tingling, tremors, sensory change, speech change, focal weakness and loss of consciousness.  Endo/Heme/Allergies:  Does not bruise/bleed easily.  Psychiatric/Behavioral:  Negative for substance abuse. The patient is not nervous/anxious.   All other systems reviewed and are negative.   EKGs/Labs/Other Studies Reviewed:    Studies reviewed were summarized above. The additional studies were reviewed today:  R/LHC 07/30/2021:   Ost LAD to Mid LAD lesion is 10% stenosed.   Mid LAD lesion is 30% stenosed.   1.  Mild nonobstructive coronary artery disease involving the  LAD which is mildly calcified.  No other obstructive disease. 2.  Right heart catheterization showed normal filling pressures, normal pulmonary pressure and normal cardiac output.  3.  FFR evaluation of the LAD showed abnormal flow with an FFR of 0.87 and CFR of 1.8.  Not significant enough to require stenting.  IMR was normal at 22 indicating normal microvascular function.   Recommendations: These cardiac findings do not explain the severity of the patient's symptoms. Recommend continuing medical therapy. __________  CPX 07/01/2021: Attending: This is a puzzling test. The patient's peak VO2 and HR response are severely depressed despite a peak RER suggestive of a maximal effort. That said, the VE/VCO2 slope is moderately elevated but not proportional to the degree of limitation seen in the peak VO2 and argues against a truly severe cardiac limitation. Resting PFTs are normal and do not suggest a pulmonary limitation or vocal cord dysfunction. I am suspicious for possible air leak or metabolic limitation to exercise. Will d/w referring provider. __________  2D echo 02/14/2021: 1. Left ventricular ejection fraction, by estimation, is 60 to 65%. The  left ventricle has normal function. The left ventricle has no regional  wall  motion abnormalities. There is mild left ventricular hypertrophy.  Left ventricular diastolic parameters  are consistent with Grade I diastolic dysfunction (impaired relaxation).  The average left ventricular global longitudinal strain is -17.4 %. The  global longitudinal strain is normal.   2. Right ventricular systolic function is normal. The right ventricular  size is normal. Tricuspid regurgitation signal is inadequate for assessing  PA pressure. __________  Carlton Adam MPI 01/24/2021: Pharmacological myocardial perfusion imaging study with no significant  ischemia Normal wall motion, EF estimated at 60% No EKG changes concerning for ischemia at peak stress or in recovery. CT attenuation correction imaging: Coronary calcification noted predominantly in the LAD and RCA no significant aortic atherosclerosis noted low risk scan __________  LHC 08/05/2020: Mid LAD lesion is 30% stenosed. The left ventricular systolic function is normal. LV end diastolic pressure is mildly elevated. The left ventricular ejection fraction is 55-65% by visual estimate.   1.  Mild nonobstructive one-vessel coronary artery disease.  Mid LAD stenosis does not appear to be any worse since most recent catheterization. 2.  Normal LV systolic function and mildly elevated left ventricular end-diastolic pressure.   Recommendations: Continue medical therapy for stable angina which does not seem to be due to obstructive coronary artery disease. __________  Elwyn Reach patch 02/2018: Normal sinus rhythm. Average heart rate was 74 bpm. Rare PVCs and PACs. Overall, no significant arrhythmia. __________  2D echo 07/22/2017: - Left ventricle: The cavity size was normal. There was moderate    concentric hypertrophy. Systolic function was normal. The    estimated ejection fraction was in the range of 60% to 65%. Wall    motion was normal; there were no regional wall motion    abnormalities. Doppler parameters are consistent  with abnormal    left ventricular relaxation (grade 1 diastolic dysfunction). __________  LHC 04/16/2016: Mid LAD lesion, 30% stenosed. The lesion was not previously treated.   1. Mild one-vessel coronary artery disease with 30% stenosis in the mid LAD. The lesion actually appears better than what it looked in June 2016. No evidence of obstructive coronary artery disease. 2. Normal left ventricular end-diastolic pressure. Normal LV systolic function by an invasive imaging.   Recommendations: Continue aggressive medical therapy. It is possible that the patient might have endothelial dysfunction given his anginal symptoms and abnormal  stress test. __________  Nuclear stress test 03/16/2016: T wave inversion was noted during stress in the II, III, V5, V6 and V4 leads. Downsloping ST segment depression ST segment depression was noted during stress in the II, III, aVF, V5, V6 and V4 leads. Defect 1: There is a small defect of mild severity present in the mid anterior location. Findings consistent with mild LAD ischemia This is a low risk study. The left ventricular ejection fraction is normal (55-65%). __________  LHC 05/30/2015: Mid LAD lesion, 50% stenosed.   1. Moderate single vessel coronary artery disease involving the mid LAD which was not significant by pressure wire interrogation. 2. Normal LV systolic function and left ventricular end-diastolic pressure.   Recommendations: Aggressive medical therapy is recommended.    EKG:  EKG is ordered today.  The EKG ordered today demonstrates NSR, 74 bpm, no acute ST-T changes  Recent Labs: 02/28/2021: B Natriuretic Peptide 36.3 07/21/2021: ALT 30 10/07/2021: TSH 0.90 12/26/2021: BUN 19; Creatinine, Ser 0.80; Hemoglobin 17.8; Platelets 243; Potassium 3.6; Sodium 141  Recent Lipid Panel    Component Value Date/Time   CHOL 129 07/21/2021 0852   CHOL 167 05/08/2016 0803   TRIG 79 07/21/2021 0852   HDL 56 07/21/2021 0852   HDL 52  05/08/2016 0803   CHOLHDL 2.3 07/21/2021 0852   LDLCALC 57 07/21/2021 0852    PHYSICAL EXAM:    VS:  There were no vitals taken for this visit.  BMI: There is no height or weight on file to calculate BMI.  Physical Exam  Wt Readings from Last 3 Encounters:  12/31/21 207 lb (93.9 kg)  12/26/21 207 lb (93.9 kg)  12/17/21 206 lb (93.4 kg)     ASSESSMENT & PLAN:   Nonobstructive CAD: No symptoms concerning for angina or decompensation.  Continue current medical therapy including atorvastatin, ezetimibe, and ranolazine.  No indication for further ischemic testing at this time.  Palpitations: Schedule Zio patch.  HTN: Blood pressure is reasonably controlled in the office today.  Continue current regimen including carvedilol and losartan/HCTZ.  HLD: LDL 57.  He remains on atorvastatin and ezetimibe.  Dyspnea: Cardiac work-up has been reassuring including recent R/LHC and echo.  Suspect physical deconditioning in the context of skeletal muscle loss is contributing.  OSA: Continue CPAP.   Disposition: F/u with Dr. Fletcher Anon or an APP in 2 months.   Medication Adjustments/Labs and Tests Ordered: Current medicines are reviewed at length with the patient today.  Concerns regarding medicines are outlined above. Medication changes, Labs and Tests ordered today are summarized above and listed in the Patient Instructions accessible in Encounters.   Signed, Christell Faith, PA-C 01/15/2022 11:56 AM     [facility] [address] [place] [address] [place], [place] [address] [phone]

## 2022-01-19 ENCOUNTER — Other Ambulatory Visit: Payer: Self-pay

## 2022-01-19 ENCOUNTER — Encounter: Payer: Self-pay | Admitting: Physician Assistant

## 2022-01-19 ENCOUNTER — Ambulatory Visit (INDEPENDENT_AMBULATORY_CARE_PROVIDER_SITE_OTHER): Payer: Medicaid Other

## 2022-01-19 ENCOUNTER — Ambulatory Visit: Payer: Medicaid Other | Admitting: Physician Assistant

## 2022-01-19 VITALS — BP 136/86 | HR 72 | Ht 71.0 in | Wt 210.0 lb

## 2022-01-19 DIAGNOSIS — R55 Syncope and collapse: Secondary | ICD-10-CM

## 2022-01-19 DIAGNOSIS — G4733 Obstructive sleep apnea (adult) (pediatric): Secondary | ICD-10-CM | POA: Diagnosis not present

## 2022-01-19 DIAGNOSIS — I2510 Atherosclerotic heart disease of native coronary artery without angina pectoris: Secondary | ICD-10-CM

## 2022-01-19 DIAGNOSIS — R002 Palpitations: Secondary | ICD-10-CM

## 2022-01-19 DIAGNOSIS — Z9989 Dependence on other enabling machines and devices: Secondary | ICD-10-CM

## 2022-01-19 DIAGNOSIS — E785 Hyperlipidemia, unspecified: Secondary | ICD-10-CM

## 2022-01-19 DIAGNOSIS — I10 Essential (primary) hypertension: Secondary | ICD-10-CM

## 2022-01-19 NOTE — Patient Instructions (Signed)
Medication Instructions:  No changes at this time.  *If you need a refill on your cardiac medications before your next appointment, please call your pharmacy*   Lab Work: None  If you have labs (blood work) drawn today and your tests are completely normal, you will receive your results only by: [place] (if you have MyChart) OR A paper copy in the mail If you have any lab test that is abnormal or we need to change your treatment, we will call you to review the results.   Testing/Procedures: Your provider has ordered a heart monitor to wear for 14 days. This will be mailed to your home with instructions on placement. Once you have finished the time frame requested, you will return monitor in box provided.      Follow-Up: At [facility], you and your health needs are our priority.  As part of our continuing mission to provide you with exceptional heart care, we have created designated Provider Care Teams.  These Care Teams include your primary Cardiologist (physician) and Advanced Practice Providers (APPs -  Physician Assistants and Nurse Practitioners) who all work together to provide you with the care you need, when you need it.   Your next appointment:   2 month(s)  The format for your next appointment:   In Person  Provider:   [name], MD or Christell Faith, PA-C

## 2022-01-21 DIAGNOSIS — E538 Deficiency of other specified B group vitamins: Secondary | ICD-10-CM | POA: Diagnosis not present

## 2022-01-21 DIAGNOSIS — R002 Palpitations: Secondary | ICD-10-CM

## 2022-01-21 DIAGNOSIS — G713 Mitochondrial myopathy, not elsewhere classified: Secondary | ICD-10-CM | POA: Diagnosis not present

## 2022-01-21 DIAGNOSIS — I10 Essential (primary) hypertension: Secondary | ICD-10-CM | POA: Diagnosis not present

## 2022-01-21 DIAGNOSIS — R42 Dizziness and giddiness: Secondary | ICD-10-CM | POA: Diagnosis not present

## 2022-01-22 DIAGNOSIS — G713 Mitochondrial myopathy, not elsewhere classified: Secondary | ICD-10-CM | POA: Insufficient documentation

## 2022-01-22 DIAGNOSIS — R531 Weakness: Secondary | ICD-10-CM | POA: Insufficient documentation

## 2022-01-22 DIAGNOSIS — R55 Syncope and collapse: Secondary | ICD-10-CM | POA: Insufficient documentation

## 2022-01-22 DIAGNOSIS — E8840 Mitochondrial metabolism disorder, unspecified: Secondary | ICD-10-CM | POA: Insufficient documentation

## 2022-01-22 DIAGNOSIS — R251 Tremor, unspecified: Secondary | ICD-10-CM | POA: Insufficient documentation

## 2022-01-22 DIAGNOSIS — R0602 Shortness of breath: Secondary | ICD-10-CM | POA: Insufficient documentation

## 2022-01-28 ENCOUNTER — Other Ambulatory Visit: Payer: Self-pay | Admitting: Family Medicine

## 2022-01-28 DIAGNOSIS — I10 Essential (primary) hypertension: Secondary | ICD-10-CM

## 2022-01-29 ENCOUNTER — Other Ambulatory Visit: Payer: Self-pay | Admitting: Family Medicine

## 2022-01-29 ENCOUNTER — Other Ambulatory Visit: Payer: Self-pay | Admitting: Cardiovascular Disease

## 2022-01-30 DIAGNOSIS — G713 Mitochondrial myopathy, not elsewhere classified: Secondary | ICD-10-CM | POA: Diagnosis not present

## 2022-01-30 DIAGNOSIS — I10 Essential (primary) hypertension: Secondary | ICD-10-CM | POA: Diagnosis not present

## 2022-01-30 DIAGNOSIS — K219 Gastro-esophageal reflux disease without esophagitis: Secondary | ICD-10-CM | POA: Diagnosis not present

## 2022-01-30 DIAGNOSIS — G3184 Mild cognitive impairment, so stated: Secondary | ICD-10-CM | POA: Diagnosis not present

## 2022-01-30 DIAGNOSIS — E538 Deficiency of other specified B group vitamins: Secondary | ICD-10-CM | POA: Diagnosis not present

## 2022-01-30 DIAGNOSIS — Z7985 Long-term (current) use of injectable non-insulin antidiabetic drugs: Secondary | ICD-10-CM | POA: Diagnosis not present

## 2022-01-30 DIAGNOSIS — M4722 Other spondylosis with radiculopathy, cervical region: Secondary | ICD-10-CM | POA: Diagnosis not present

## 2022-01-30 DIAGNOSIS — D751 Secondary polycythemia: Secondary | ICD-10-CM | POA: Diagnosis not present

## 2022-01-30 DIAGNOSIS — M542 Cervicalgia: Secondary | ICD-10-CM | POA: Diagnosis not present

## 2022-01-30 DIAGNOSIS — R569 Unspecified convulsions: Secondary | ICD-10-CM | POA: Diagnosis not present

## 2022-01-30 DIAGNOSIS — E119 Type 2 diabetes mellitus without complications: Secondary | ICD-10-CM | POA: Diagnosis not present

## 2022-01-30 DIAGNOSIS — Z87891 Personal history of nicotine dependence: Secondary | ICD-10-CM | POA: Diagnosis not present

## 2022-01-30 DIAGNOSIS — Z9181 History of falling: Secondary | ICD-10-CM | POA: Diagnosis not present

## 2022-01-30 DIAGNOSIS — E785 Hyperlipidemia, unspecified: Secondary | ICD-10-CM | POA: Diagnosis not present

## 2022-02-02 ENCOUNTER — Telehealth: Payer: Self-pay | Admitting: Family Medicine

## 2022-02-02 ENCOUNTER — Encounter: Payer: Self-pay | Admitting: Family Medicine

## 2022-02-02 DIAGNOSIS — M542 Cervicalgia: Secondary | ICD-10-CM | POA: Diagnosis not present

## 2022-02-02 DIAGNOSIS — M4722 Other spondylosis with radiculopathy, cervical region: Secondary | ICD-10-CM | POA: Diagnosis not present

## 2022-02-02 DIAGNOSIS — E785 Hyperlipidemia, unspecified: Secondary | ICD-10-CM | POA: Diagnosis not present

## 2022-02-02 DIAGNOSIS — Z9181 History of falling: Secondary | ICD-10-CM | POA: Diagnosis not present

## 2022-02-02 DIAGNOSIS — E119 Type 2 diabetes mellitus without complications: Secondary | ICD-10-CM | POA: Diagnosis not present

## 2022-02-02 DIAGNOSIS — Z87891 Personal history of nicotine dependence: Secondary | ICD-10-CM | POA: Diagnosis not present

## 2022-02-02 DIAGNOSIS — G3184 Mild cognitive impairment, so stated: Secondary | ICD-10-CM | POA: Diagnosis not present

## 2022-02-02 DIAGNOSIS — G713 Mitochondrial myopathy, not elsewhere classified: Secondary | ICD-10-CM | POA: Diagnosis not present

## 2022-02-02 DIAGNOSIS — D751 Secondary polycythemia: Secondary | ICD-10-CM | POA: Diagnosis not present

## 2022-02-02 DIAGNOSIS — E538 Deficiency of other specified B group vitamins: Secondary | ICD-10-CM | POA: Diagnosis not present

## 2022-02-02 DIAGNOSIS — K219 Gastro-esophageal reflux disease without esophagitis: Secondary | ICD-10-CM | POA: Diagnosis not present

## 2022-02-02 DIAGNOSIS — R569 Unspecified convulsions: Secondary | ICD-10-CM | POA: Diagnosis not present

## 2022-02-02 DIAGNOSIS — Z7985 Long-term (current) use of injectable non-insulin antidiabetic drugs: Secondary | ICD-10-CM | POA: Diagnosis not present

## 2022-02-02 DIAGNOSIS — I10 Essential (primary) hypertension: Secondary | ICD-10-CM | POA: Diagnosis not present

## 2022-02-02 NOTE — Telephone Encounter (Signed)
Verbal given 

## 2022-02-02 NOTE — Telephone Encounter (Signed)
Home Health Verbal Orders - Caller/Agency: Phu, PT with [facility]' Callback Number:  [phone] Requesting   PT Frequency:  2 wk 4 1 wk 4

## 2022-02-03 NOTE — Telephone Encounter (Signed)
Copied from [place] [phone] [phone]. Topic: General - Inquiry >> Feb 03, 2022 10:23 AM Oneta Rack wrote: Nursing home health orders for 2x 1, 1x 2, 2 PRNs

## 2022-02-05 DIAGNOSIS — Z7985 Long-term (current) use of injectable non-insulin antidiabetic drugs: Secondary | ICD-10-CM | POA: Diagnosis not present

## 2022-02-05 DIAGNOSIS — Z87891 Personal history of nicotine dependence: Secondary | ICD-10-CM | POA: Diagnosis not present

## 2022-02-05 DIAGNOSIS — G713 Mitochondrial myopathy, not elsewhere classified: Secondary | ICD-10-CM | POA: Diagnosis not present

## 2022-02-05 DIAGNOSIS — Z9181 History of falling: Secondary | ICD-10-CM | POA: Diagnosis not present

## 2022-02-05 DIAGNOSIS — G3184 Mild cognitive impairment, so stated: Secondary | ICD-10-CM | POA: Diagnosis not present

## 2022-02-05 DIAGNOSIS — E538 Deficiency of other specified B group vitamins: Secondary | ICD-10-CM | POA: Diagnosis not present

## 2022-02-05 DIAGNOSIS — R569 Unspecified convulsions: Secondary | ICD-10-CM | POA: Diagnosis not present

## 2022-02-05 DIAGNOSIS — D751 Secondary polycythemia: Secondary | ICD-10-CM | POA: Diagnosis not present

## 2022-02-05 DIAGNOSIS — M4722 Other spondylosis with radiculopathy, cervical region: Secondary | ICD-10-CM | POA: Diagnosis not present

## 2022-02-05 DIAGNOSIS — E119 Type 2 diabetes mellitus without complications: Secondary | ICD-10-CM | POA: Diagnosis not present

## 2022-02-05 DIAGNOSIS — K219 Gastro-esophageal reflux disease without esophagitis: Secondary | ICD-10-CM | POA: Diagnosis not present

## 2022-02-05 DIAGNOSIS — I10 Essential (primary) hypertension: Secondary | ICD-10-CM | POA: Diagnosis not present

## 2022-02-05 DIAGNOSIS — E785 Hyperlipidemia, unspecified: Secondary | ICD-10-CM | POA: Diagnosis not present

## 2022-02-05 DIAGNOSIS — M542 Cervicalgia: Secondary | ICD-10-CM | POA: Diagnosis not present

## 2022-02-05 NOTE — Progress Notes (Signed)
Name: SULEYMAN EHRMAN   MRN: [id number]    DOB: Jul 09, 1961   Date:02/06/2022       Progress Note  Subjective  Chief Complaint  Consult  HPI  DMII: A1C has been well controlled now 5.4 %  He is off Lantus, Trajenta, Metformin , we also stopped [place] in March 22 , he is currently on Trulicity down to 2.63 mg every other week. He states his glucose has been around 115-120, we will stop Trulicity and go back to [place]. He denies polyphagia, polyuria or polydipsia   Elevated CK: seeing neurologist , going to see neuromuscular sub-specialist at [facility] in April, he is getting PT three times a week at home, having to use a cane. He also has dysphagia - takes PPI but may be from muscular dysfunction    A. 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 See comment. - Mild, chronic neurogenic atrophy.    Constipation: doing well on Amitiza, down to once daily pill  , Bristol scale of 4 , no longer straining, has increased fiber in his diet, he has bowel movements now at least once a day but no more than 3 times per day.   He is under the care of Dr. Marius Ditch    HTN/Angina : taking medication, bp has been under control at home highest is 116-130'/ 63-85.  He is on ARB and Ranexa, off Imdur due to hypotension. Denies recent chest pain or palpitation    SOB: going on since Fall 2021) he has been evaluated by pulmonologist and cardiologist and no answers yet, cardiac cath unremarkable, also negative  CT chest. Stable.    Cardiac cath done 07/2021     Ost LAD to Mid LAD lesion is 10% stenosed.   Mid LAD lesion is 30% stenosed   Hyperlipidemia: compliant with medication, reviewed last labs , LDL at goal it was 68 , he is on Zetia only , off statin therapy due to muscle aches    Seizure like activity  : he is now seeing Dr. Melrose Nakayama, still has tremors when standing up, but no seziure like activity  lately, he has episodes of dizziness and headaches. . He has a history of neck surgery and is on Lyrica .Stable    Low B12: last level was high, she is taking otc supplementation weekly    OSA: he is wearing CPAP every night,HCT was high on his last visit, he states compliant with machine  but not sleeping well due to muscle cramping at night    Cervical left radiculopathy: seen by Dr. Jobe Igo and PT advised since his SOB and recent health issues makes him a poor candidate for surgery at this time   Imaging: MRI C spine 08/26/2021 IMPRESSION:  1. Multilevel spondylosis of the cervical spine as described.  2. Moderate foraminal narrowing bilaterally at C2-3, right greater  than left.  3. Moderate right mild left foraminal narrowing at C3-4.  4. Moderate foraminal narrowing bilaterally at C5-6 and C6-7 is  worse on the right.  5. Mild central canal narrowing at C6-7.    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   Tremor 01/22/2022   Weakness 01/22/2022   Syncope 01/22/2022   Shortness of breath 01/22/2022   Elevated CK 11/10/2021   Muscle atrophy of lower extremity 11/10/2021   Pica 11/10/2021   Loss of weight    Dysphagia    SOB (shortness of breath)    Asthma 05/14/2021   Multiple thyroid nodules 05/14/2021   Localized, primary osteoarthritis of hand 09/28/2019   Seizure ([place]) 02/22/2018   Mild cognitive impairment 11/17/2017   Radiculopathy of cervical region 11/17/2017   Stenosis of carotid artery 09/08/2017   Cervical stenosis of spine 09/08/2017   Headache disorder 02/15/2017   Memory loss or impairment 02/15/2017   Coronary artery disease  involving native coronary artery of native heart    Radiculitis of left cervical region 03/05/2016   Diabetic neuropathy associated with type 2 diabetes mellitus ([place]) 12/05/2015   Patellar subluxation 06/05/2015   Allergic rhinitis, seasonal 06/05/2015   Chronic constipation 06/05/2015   Chronic lower back pain 06/05/2015   Vitamin D deficiency 06/05/2015   Central sleep apnea 06/05/2015   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   Nerve root pain 06/05/2015   Acquired polycythemia 06/05/2015   Anterior knee pain 16/96/7893   Dysmetabolic syndrome 81/12/7508   Failure of erection 06/05/2015   Gastro-esophageal reflux disease without esophagitis 06/05/2015   Neuropathy 06/05/2015   CAD (coronary artery disease)    Angina, class III ([place]) 05/23/2015   Essential hypertension 05/23/2015   Hyperlipidemia 05/23/2015   Inguinal hernia without mention of obstruction or gangrene, recurrent unilateral or unspecified 03/24/2013    Past Surgical History:  Procedure Laterality Date   BACK SURGERY  12/2008   X2-LUMBAR   CARDIAC CATHETERIZATION N/A 05/30/2015   Procedure: Left Heart Cath;  Surgeon: Wellington Hampshire, MD;  Location: [place] [facility];  Service: Cardiovascular;  Laterality: N/A;   CARDIAC CATHETERIZATION N/A 04/16/2016   Procedure: Left Heart Cath and Coronary Angiography;  Surgeon: Wellington Hampshire, MD;  Location: [place] [facility];  Service: Cardiovascular;  Laterality: N/A;   COLONOSCOPY  2014   Dr. [name]   COLONOSCOPY WITH PROPOFOL N/A 12/25/2016   Procedure: COLONOSCOPY WITH PROPOFOL;  Surgeon: Jonathon Bellows, MD;  Location: [facility] ENDOSCOPY;  Service: Endoscopy;  Laterality: N/A;   COLONOSCOPY WITH PROPOFOL N/A 01/29/2017   Procedure: COLONOSCOPY WITH PROPOFOL;  Surgeon: Jonathon Bellows, MD;  Location: [facility] ENDOSCOPY;  Service: Endoscopy;  Laterality: N/A;   COLONOSCOPY WITH PROPOFOL N/A 04/11/2020   Procedure: COLONOSCOPY WITH PROPOFOL;  Surgeon: Jonathon Bellows, MD;  Location[facility] ENDOSCOPY;   Service: Gastroenterology;  Laterality: N/A;   ESOPHAGOGASTRODUODENOSCOPY (EGD) WITH PROPOFOL N/A 01/26/2019   Procedure: ESOPHAGOGASTRODUODENOSCOPY (EGD) WITH PROPOFOL;  Surgeon: Lin Landsman, MD;  Location: [facility] ENDOSCOPY;  Service: Gastroenterology;  Laterality: N/A;   ESOPHAGOGASTRODUODENOSCOPY (EGD) WITH PROPOFOL N/A 09/10/2021   Procedure: ESOPHAGOGASTRODUODENOSCOPY (EGD) WITH PROPOFOL;  Surgeon: Lin Landsman, MD;  Location[facility] END[facility]COPY;  Service: Gastroenterology;  Laterality: N/A;   EVALUATION UNDER ANESTHESIA WITH HEMORRHOIDECTOMY N/A 02/24/2017   Procedure: EXAM UNDER ANESTHESIA WITH POSSIBLE EXCISION OF INTERNAL HEMORRHOIDS;  Surgeon: Olean Ree, MD;  Location: [place] [facility];  Service: General;  Laterality: N/A;   FISSURECTOMY  02/24/2017   Procedure: FISSURECTOMY;  Surgeon: Olean Ree, MD;  Location: [facility];  Service: General;;   HAND SURGERY Left 2020   HERNIA REPAIR Right 1991   INGUINAL HERNIA REPAIR Right 2012  Dr Teryl Lucy HERNIA REPAIR Right 2014   Dr Priscille Kluver PRESSURE WIRE/FFR STUDY N/A 07/30/2021   Procedure: INTRAVASCULAR PRESSURE WIRE/FFR STUDY;  Surgeon: Wellington Hampshire, MD;  Location: [place] [facility];  Service: Cardiovascular;  Laterality: N/A;   LEFT HEART CATH AND CORONARY ANGIOGRAPHY Left 08/05/2020   Procedure: LEFT HEART CATH AND CORONARY ANGIOGRAPHY poss PCI;  Surgeon: Wellington Hampshire, MD;  Location: [place] [facility];  Service: Cardiovascular;  Laterality: Left;   MUSCLE BIOPSY Left 12/31/2021   Procedure: LEFT VASTUS LATERALIS BIOPSY;  Surgeon: Meade Maw, MD;  Location: [facility];  Service: Neurosurgery;  Laterality: Left;   RIGHT/LEFT HEART CATH AND CORONARY ANGIOGRAPHY N/A 07/30/2021   Procedure: RIGHT/LEFT HEART CATH AND CORONARY ANGIOGRAPHY;  Surgeon: Wellington Hampshire, MD;  Location: [place] [facility];  Service: Cardiovascular;  Laterality: N/A;    Family History  Problem Relation Age of Onset    Hypertension Mother    Heart Problems Mother    High Cholesterol Mother    Heart Problems Father 14       myocardial infarction   Mental illness Brother    Other Maternal Aunt        COVID   Lung cancer Maternal Aunt    Lung cancer Maternal Aunt    Cancer Maternal Uncle        unknown   Prostate cancer Maternal Uncle    Testicular cancer Paternal Uncle    Thyroid disease Daughter    Breast cancer Cousin     Social History   Tobacco Use   Smoking status: Former    Types: Cigars    Start date: 12/28/2002    Quit date: 06/04/2005    Years since quitting: 16.6   Smokeless tobacco: Never   Tobacco comments:    quit 2006-smoked 1 cigar occ  Substance Use Topics   Alcohol use: Not Currently    Comment: has not had a drink in 8 months 02/2021     Current Outpatient Medications:    ACCU-CHEK GUIDE test strip, USE TO TEST BLOOD SUGAR THREE TIMES DAILY AS NEEDED, Disp: 100 strip, Rfl: 1   Accu-Chek Softclix Lancets lancets, TEST TWICE DAILY, Disp: 100 each, Rfl: 3   blood glucose meter kit and supplies, 1 each by Other route in the morning and at bedtime. Dispense based on patient and insurance preference. Use up to two  times daily. (FOR ICD-10 E10.9, E11.9)., Disp: 1 each, Rfl: 0   carvedilol (COREG) 6.25 MG tablet, TAKE 1 TABLET(6.25 MG) BY MOUTH TWICE DAILY, Disp: 180 tablet, Rfl: 1   dapagliflozin propanediol (FARXIGA) 10 MG TABS tablet, Take 1 tablet (10 mg total) by mouth daily before breakfast., Disp: 90 tablet, Rfl: 1   fluticasone (FLONASE) 50 MCG/ACT nasal spray, Place 1 spray into both nostrils daily as needed for allergies or rhinitis., Disp: , Rfl:    ketoconazole (NIZORAL) 2 % shampoo, Apply 1 application topically 3 (three) times a week. Wash scalp/body 3 times a week as needed for alres, let sit 5 minutes before rinsing off, avoid eyes. May use once monthly for maintenance. (Patient taking differently: Apply 1 application topically every three (3) days as needed (scalp  psoriasis.). Wash scalp/body 3 times a week as needed for alres, let sit 5 minutes before rinsing off, avoid eyes. May use once monthly for maintenance.), Disp: 120 mL, Rfl: 11   losartan-hydrochlorothiazide (HYZAAR) 50-12.5 MG tablet, TAKE 1 TABLET BY MOUTH DAILY, Disp: 30  tablet, Rfl: 0   mupirocin ointment (BACTROBAN) 2 %, Apply 1 application topically daily. Qd to excision site (Patient taking differently: Apply 1 application topically daily as needed (skin irritation.). Qd to excision site), Disp: 22 g, Rfl: 1   nitroGLYCERIN (NITROSTAT) 0.4 MG SL tablet, Place 1 tablet (0.4 mg total) under the tongue every 5 (five) minutes as needed for chest pain., Disp: , Rfl:    omeprazole (PRILOSEC) 40 MG capsule, TAKE 1 CAPSULE(40 MG) BY MOUTH TWICE DAILY BEFORE A MEAL, Disp: 60 capsule, Rfl: 2   ranolazine (RANEXA) 1000 MG SR tablet, Take 1 tablet (1,000 mg total) by mouth 2 (two) times daily., Disp: 180 tablet, Rfl: 3   temazepam (RESTORIL) 15 MG capsule, Take 1 capsule (15 mg total) by mouth at bedtime as needed for sleep., Disp: 30 capsule, Rfl: 0   ezetimibe (ZETIA) 10 MG tablet, Take 1 tablet (10 mg total) by mouth daily., Disp: 90 tablet, Rfl: 1   pregabalin (LYRICA) 50 MG capsule, Take 1 capsule (50 mg total) by mouth 3 (three) times daily., Disp: 270 capsule, Rfl: 1  Allergies  Allergen Reactions   Gabapentin Other (See Comments)    Groggy-Mood Changes   Latex Rash   Penicillins Hives and Rash    Has patient had a PCN reaction causing immediate rash, facial/tongue/throat swelling, SOB or lightheadedness with hypotension: Yes Has patient had a PCN reaction causing severe rash involving mucus membranes or skin necrosis: No Has patient had a PCN reaction that required hospitalization No Has patient had a PCN reaction occurring within the last 10 years: No If all of the above answers are "NO", then may proceed with Cephalosporin use.     I personally reviewed active problem list, medication  list, allergies, family history, social history, health maintenance with the patient/caregiver today.   ROS  Ten systems reviewed and is negative except as mentioned in HPI   Objective  Vitals:   02/06/22 0811  BP: 135/82  Pulse: 91  Resp: 16  SpO2: 97%  Weight: 209 lb (94.8 kg)  Height: 5' 11" (1.803 m)    Body mass index is 29.15 kg/m.  Physical Exam  Constitutional: Patient appears well-developed and well-nourished. Overweight.  No distress.  HEENT: head atraumatic, normocephalic, pupils equal and reactive to light, neck supple Cardiovascular: Normal rate, regular rhythm and normal heart sounds.  No murmur heard. No BLE edema. Pulmonary/Chest: Effort normal and breath sounds normal. No respiratory distress. Abdominal: Soft.  There is no tenderness. Psychiatric: Patient has a normal mood and affect. behavior is normal. Judgment and thought content normal.  Muscular skeletal: atrophy of shoulders, gait is unsteady, using hip to lift his legs   Recent Results (from the past 2160 hour(s))  CK     Status: Abnormal   Collection Time: 11/10/21  9:40 AM  Result Value Ref Range   Total CK 356 (H) 44 - 196 U/L  Myositis Assessr Plus Jo-1 Autoabs     Status: None   Collection Time: 11/10/21  9:40 AM  Result Value Ref Range   PL-7 Autoabs NOT DETECTED NOT DETECTED   PL-12 Autoabs NOT DETECTED NOT DETECTED   Mi-2 Autoabs NOT DETECTED NOT DETECTED   Ku Autoabs NOT DETECTED NOT DETECTED   EJ Autoabs NOT DETECTED NOT DETECTED   OJ Autoabs NOT DETECTED NOT DETECTED   SRP Autoabs NOT DETECTED NOT DETECTED   Jo-1 Autoabs <1.0 NEG <1.0 NEGATIVE AI  Serum protein electrophoresis with reflex  Status: None   Collection Time: 11/10/21  9:40 AM  Result Value Ref Range   Total Protein 7.6 6.1 - 8.1 g/dL   Albumin ELP 4.4 3.8 - 4.8 g/dL   Alpha 1 0.3 0.2 - 0.3 g/dL   Alpha 2 0.8 0.5 - 0.9 g/dL   Beta Globulin 0.5 0.4 - 0.6 g/dL   Beta 2 0.5 0.2 - 0.5 g/dL   Gamma Globulin 1.1 0.8  - 1.7 g/dL   SPE Interp.      Comment: Normal Serum Protein Electrophoresis Pattern. No abnormal protein bands (M-protein) detected.   Aldolase     Status: None   Collection Time: 11/10/21  9:40 AM  Result Value Ref Range   Aldolase 6.2 < OR = 8.1 U/L  Iron, TIBC and Ferritin Panel     Status: None   Collection Time: 11/10/21  9:40 AM  Result Value Ref Range   Iron 125 50 - 180 mcg/dL   TIBC 368 250 - 425 mcg/dL (calc)   %SAT 34 20 - 48 % (calc)   Ferritin 228 38 - 380 ng/mL  CBC     Status: Abnormal   Collection Time: 12/26/21  2:30 PM  Result Value Ref Range   WBC 10.4 4.0 - 10.5 K/uL   RBC 5.81 4.22 - 5.81 MIL/uL   Hemoglobin 17.8 (H) 13.0 - 17.0 g/dL   HCT 53.2 (H) 39.0 - 52.0 %   MCV 91.6 80.0 - 100.0 fL   MCH 30.6 26.0 - 34.0 pg   MCHC 33.5 30.0 - 36.0 g/dL   RDW 13.7 11.5 - 15.5 %   Platelets 243 150 - 400 K/uL   nRBC 0.0 0.0 - 0.2 %    Comment: Performed at [facility], [address] [place], [place] [address]  Basic metabolic panel per protocol     Status: Abnormal   Collection Time: 12/26/21  2:30 PM  Result Value Ref Range   Sodium 141 135 - 145 mmol/L   Potassium 3.6 3.5 - 5.1 mmol/L   Chloride 105 98 - 111 mmol/L   CO2 26 22 - 32 mmol/L   Glucose, Bld 105 (H) 70 - 99 mg/dL    Comment: Glucose reference range applies only to samples taken after fasting for at least 8 hours.   BUN 19 6 - 20 mg/dL   Creatinine, Ser 0.80 0.61 - 1.24 mg/dL   Calcium 9.5 8.9 - 10.3 mg/dL   GFR, Estimated >60 >60 mL/min    Comment: (NOTE) Calculated using the CKD-EPI Creatinine Equation (2021)    Anion gap 10 5 - 15    Comment: Performed at [facility], [address] [place], [place] [address]  Glucose, capillary     Status: Abnormal   Collection Time: 12/31/21  7:10 AM  Result Value Ref Range   Glucose-Capillary 113 (H) 70 - 99 mg/dL    Comment: Glucose reference range applies only to samples taken after fasting for at least 8 hours.  Glucose,  capillary     Status: None   Collection Time: 12/31/21  9:21 AM  Result Value Ref Range   Glucose-Capillary 97 70 - 99 mg/dL    Comment: Glucose reference range applies only to samples taken after fasting for at least 8 hours.    Diabetic Foot Exam: Diabetic Foot Exam - Simple   Simple Foot Form Diabetic Foot exam was performed with the following findings: Yes 02/06/2022  9:06 AM  Visual Inspection See comments: Yes  Sensation Testing Intact to touch and monofilament testing bilaterally: Yes Pulse Check Posterior Tibialis and Dorsalis pulse intact bilaterally: Yes Comments Left toe nail is missing       PHQ2/9: Depression screen [facility] 2/9 02/06/2022 10/07/2021 07/21/2021 03/26/2021 02/28/2021  Decreased Interest 0 0 0 0 0  Down, Depressed, Hopeless 0 0 0 0 0  PHQ - 2 Score 0 0 0 0 0  Altered sleeping 0 - - - -  Tired, decreased energy 0 - - - -  Change in appetite 0 - - - -  Feeling bad or failure about yourself  0 - - - -  Trouble concentrating 0 - - - -  Moving slowly or fidgety/restless 0 - - - -  Suicidal thoughts 0 - - - -  PHQ-9 Score 0 - - - -  Difficult doing work/chores - - - - -  Some recent data might be hidden    phq 9 is negative   Fall Risk: Fall Risk  02/06/2022 10/07/2021 07/21/2021 03/26/2021 02/28/2021  Falls in the past year? 0 1 0 0 0  Number falls in past yr: 0 1 0 0 0  Injury with Fall? 0 1 0 0 -  Comment - - - - -  Risk for fall due to : No Fall Risks History of fall(s) - - -  Follow up Falls prevention discussed Falls prevention discussed - - Falls prevention discussed      Functional Status Survey: Is the patient deaf or have difficulty hearing?: No Does the patient have difficulty seeing, even when wearing glasses/contacts?: No Does the patient have difficulty concentrating, remembering, or making decisions?: No Does the patient have difficulty walking or climbing stairs?: No Does the patient have difficulty dressing or bathing?: No Does the  patient have difficulty doing errands alone such as visiting a doctor's office or shopping?: No    Assessment & Plan  1. Controlled type 2 diabetes mellitus with microalbuminuria, without long-term current use of insulin (HCC)  - HgB A1c - HM Diabetes Foot Exam  2. Hypertension associated with diabetes ([place])   3. Dyslipidemia associated with type 2 diabetes mellitus (HCC)  - ezetimibe (ZETIA) 10 MG tablet; Take 1 tablet (10 mg total) by mouth daily.  Dispense: 90 tablet; Refill: 1  4. Coronary artery disease of native artery of native heart with stable angina pectoris ([place])   5. Seizure ([place])   6. Moderate protein-calorie malnutrition ([place])  Discussed high protein diet  7. B12 deficiency   8. Need for shingles vaccine  - Varicella-zoster vaccine IM (Shingrix)  9. Sciatica of right side  - pregabalin (LYRICA) 50 MG capsule; Take 1 capsule (50 mg total) by mouth 3 (three) times daily.  Dispense: 270 capsule; Refill: 1  10. Cervical radiculitis  pregabalin (LYRICA) 50 MG capsule; Take 1 capsule (50 mg total) by mouth 3 (three) times daily.  Dispense: 270 capsule; Refill: 1  11. Muscle weakness   12. Essential hypertension   13. Primary central sleep apnea   14. Other insomnia  - temazepam (RESTORIL) 15 MG capsule; Take 1 capsule (15 mg total) by mouth at bedtime as needed for sleep.  Dispense: 30 capsule; Refill: 0

## 2022-02-06 ENCOUNTER — Ambulatory Visit: Payer: Medicaid Other | Admitting: Family Medicine

## 2022-02-06 ENCOUNTER — Encounter: Payer: Self-pay | Admitting: Family Medicine

## 2022-02-06 ENCOUNTER — Other Ambulatory Visit: Payer: Self-pay

## 2022-02-06 VITALS — BP 135/82 | HR 91 | Resp 16 | Ht 71.0 in | Wt 209.0 lb

## 2022-02-06 DIAGNOSIS — Z23 Encounter for immunization: Secondary | ICD-10-CM

## 2022-02-06 DIAGNOSIS — E1169 Type 2 diabetes mellitus with other specified complication: Secondary | ICD-10-CM

## 2022-02-06 DIAGNOSIS — R809 Proteinuria, unspecified: Secondary | ICD-10-CM

## 2022-02-06 DIAGNOSIS — R569 Unspecified convulsions: Secondary | ICD-10-CM

## 2022-02-06 DIAGNOSIS — I25118 Atherosclerotic heart disease of native coronary artery with other forms of angina pectoris: Secondary | ICD-10-CM

## 2022-02-06 DIAGNOSIS — I152 Hypertension secondary to endocrine disorders: Secondary | ICD-10-CM

## 2022-02-06 DIAGNOSIS — I10 Essential (primary) hypertension: Secondary | ICD-10-CM | POA: Diagnosis not present

## 2022-02-06 DIAGNOSIS — M6281 Muscle weakness (generalized): Secondary | ICD-10-CM

## 2022-02-06 DIAGNOSIS — G4709 Other insomnia: Secondary | ICD-10-CM

## 2022-02-06 DIAGNOSIS — E1129 Type 2 diabetes mellitus with other diabetic kidney complication: Secondary | ICD-10-CM | POA: Diagnosis not present

## 2022-02-06 DIAGNOSIS — M5412 Radiculopathy, cervical region: Secondary | ICD-10-CM

## 2022-02-06 DIAGNOSIS — E1159 Type 2 diabetes mellitus with other circulatory complications: Secondary | ICD-10-CM

## 2022-02-06 DIAGNOSIS — E785 Hyperlipidemia, unspecified: Secondary | ICD-10-CM

## 2022-02-06 DIAGNOSIS — E440 Moderate protein-calorie malnutrition: Secondary | ICD-10-CM | POA: Diagnosis not present

## 2022-02-06 DIAGNOSIS — G4731 Primary central sleep apnea: Secondary | ICD-10-CM

## 2022-02-06 DIAGNOSIS — M5431 Sciatica, right side: Secondary | ICD-10-CM | POA: Diagnosis not present

## 2022-02-06 DIAGNOSIS — E538 Deficiency of other specified B group vitamins: Secondary | ICD-10-CM

## 2022-02-06 MED ORDER — EZETIMIBE 10 MG PO TABS: 10.0000 mg | ORAL_TABLET | Freq: Every day | ORAL | 1 refills | Status: DC

## 2022-02-06 MED ORDER — DAPAGLIFLOZIN PROPANEDIOL 10 MG PO TABS: 10.0000 mg | ORAL_TABLET | Freq: Every day | ORAL | 1 refills | Status: DC

## 2022-02-06 MED ORDER — TEMAZEPAM 15 MG PO CAPS: 15.0000 mg | ORAL_CAPSULE | Freq: Every evening | ORAL | 0 refills | Status: DC | PRN

## 2022-02-06 MED ORDER — PREGABALIN 50 MG PO CAPS: 50.0000 mg | ORAL_CAPSULE | Freq: Three times a day (TID) | ORAL | 1 refills | Status: DC

## 2022-02-06 NOTE — Patient Instructions (Signed)
High-Protein and High-Calorie Diet Eating high-protein and high-calorie foods can help you to gain weight, heal after an injury, and recover after an illness or surgery. The specific amount of daily protein and calories you need depends on: Your body weight. The reason this diet is recommended for you. Generally, a high-protein, high-calorie diet involves: Eating 250-500 extra calories each day. Making sure that you get enough of your daily calories from protein. Ask your health care provider how many of your calories should come from protein. Talk with a health care provider or a dietitian about how much protein and how many calories you need each day. Follow the diet as directed by your health care provider. What are tips for following this plan? Reading food labels Check the nutrition facts label for calories, grams of fat and protein. Items with more than 4 grams of protein are high-protein foods. Preparing meals Add whole milk, half-and-half, or heavy cream to cereal, pudding, soup, or hot cocoa. Add whole milk to instant breakfast drinks. Add peanut butter to oatmeal or smoothies. Add powdered milk to baked goods, smoothies, or milkshakes. Add powdered milk, cream, or butter to mashed potatoes. Add cheese to cooked vegetables. Make whole-milk yogurt parfaits. Top them with granola, fruit, or nuts. Add cottage cheese to fruit. Add avocado, cheese, or both to sandwiches or salads. Add avocado to smoothies. Add meat, poultry, or seafood to rice, pasta, casseroles, salads, and soups. Use mayonnaise when making egg salad, chicken salad, or tuna salad. Use peanut butter as a dip for fruits and vegetables or as a topping for pretzels, celery, or crackers. Add beans to casseroles, dips, and spreads. Add pureed beans to sauces and soups. Replace calorie-free drinks with calorie-containing drinks, such as milk and fruit juice. Replace water with milk or heavy cream when making foods such as  oatmeal, pudding, or cocoa. Add oil or butter to cooked vegetables and grains. Add cream cheese to sandwiches or as a topping on crackers and bread. Make cream-based pastas and soups. General information Ask your health care provider if you should take a nutritional supplement. Try to eat six small meals each day instead of three large meals. A general goal is to eat every 2 to 3 hours. Eat a balanced diet. In each meal, include one food that is high in protein and one food with fat in it. Keep nutritious snacks available, such as nuts, trail mixes, dried fruit, and yogurt. If you have kidney disease or diabetes, talk with your health care provider about how much protein is safe for you. Too much protein may put extra stress on your kidneys. Drink your calories. Choose high-calorie drinks and have them after your meals. Consider setting a timer to remind you to eat. You will want to eat even if you do not feel very hungry. What high-protein foods should I eat? Vegetables Soybeans. Peas. Grains Quinoa. Bulgur wheat. Buckwheat. Meats and other proteins Beef, pork, and poultry. Fish and seafood. Eggs. Tofu. Textured vegetable protein (TVP). Peanut butter. Nuts and seeds. Dried beans. Protein powders. Hummus. Dairy Whole milk. Whole-milk yogurt. Powdered milk. Cheese. Cottage Cheese. Eggnog. Beverages High-protein supplement drinks. Soy milk. Other foods Protein bars. The items listed above may not be a complete list of foods and beverages you can eat and drink. Contact a dietitian for more information. What high-calorie foods should I eat? Fruits Dried fruit. Fruit leather. Canned fruit in syrup. Fruit juice. Avocado. Vegetables Vegetables cooked in oil or butter. Fried potatoes. Grains Pasta. Quick   breads. Muffins. Pancakes. Ready-to-eat cereal. Meats and other proteins Peanut butter. Nuts and seeds. Dairy Heavy cream. Whipped cream. Cream cheese. Sour cream. Ice cream. Custard.  Pudding. Whole milk dairy products. Beverages Meal-replacement beverages. Nutrition shakes. Fruit juice. Seasonings and condiments Salad dressing. Mayonnaise. Alfredo sauce. Fruit preserves or jelly. Honey. Syrup. Sweets and desserts Cake. Cookies. Pie. Pastries. Candy bars. Chocolate. Fats and oils Butter or margarine. Oil. Gravy. Other foods Meal-replacement bars. The items listed above may not be a complete list of foods and beverages you can eat and drink. Contact a dietitian for more information. Summary A high-protein, high-calorie diet can help you gain weight or heal faster after an injury, illness, or surgery. To increase your protein and calories, add ingredients such as whole milk, peanut butter, cheese, beans, meat, or seafood to meal items. To get enough extra calories each day, include high-calorie foods and beverages at each meal. Adding a high-calorie drink or shake can be an easy way to help you get enough calories each day. Talk with your healthcare provider or dietitian about the best options for you. This information is not intended to replace advice given to you by your health care provider. Make sure you discuss any questions you have with your health care provider. Document Revised: 11/17/2020 Document Reviewed: 11/17/2020 Elsevier Patient Education  2022 [employer]  

## 2022-02-07 LAB — HEMOGLOBIN A1C
Hgb A1c MFr Bld: 5.5 % of total Hgb (ref <5.7)
Mean Plasma Glucose: 111 mg/dL
eAG (mmol/L): 6.2 mmol/L

## 2022-02-09 ENCOUNTER — Encounter: Payer: Self-pay | Admitting: Family Medicine

## 2022-02-09 ENCOUNTER — Other Ambulatory Visit: Payer: Self-pay

## 2022-02-09 DIAGNOSIS — E1129 Type 2 diabetes mellitus with other diabetic kidney complication: Secondary | ICD-10-CM

## 2022-02-09 DIAGNOSIS — R809 Proteinuria, unspecified: Secondary | ICD-10-CM

## 2022-02-09 NOTE — Progress Notes (Signed)
r 

## 2022-02-10 DIAGNOSIS — E538 Deficiency of other specified B group vitamins: Secondary | ICD-10-CM | POA: Diagnosis not present

## 2022-02-10 DIAGNOSIS — G713 Mitochondrial myopathy, not elsewhere classified: Secondary | ICD-10-CM | POA: Diagnosis not present

## 2022-02-10 DIAGNOSIS — K219 Gastro-esophageal reflux disease without esophagitis: Secondary | ICD-10-CM | POA: Diagnosis not present

## 2022-02-10 DIAGNOSIS — R569 Unspecified convulsions: Secondary | ICD-10-CM | POA: Diagnosis not present

## 2022-02-10 DIAGNOSIS — D751 Secondary polycythemia: Secondary | ICD-10-CM | POA: Diagnosis not present

## 2022-02-10 DIAGNOSIS — Z9181 History of falling: Secondary | ICD-10-CM | POA: Diagnosis not present

## 2022-02-10 DIAGNOSIS — E785 Hyperlipidemia, unspecified: Secondary | ICD-10-CM | POA: Diagnosis not present

## 2022-02-10 DIAGNOSIS — I10 Essential (primary) hypertension: Secondary | ICD-10-CM | POA: Diagnosis not present

## 2022-02-10 DIAGNOSIS — Z87891 Personal history of nicotine dependence: Secondary | ICD-10-CM | POA: Diagnosis not present

## 2022-02-10 DIAGNOSIS — M4722 Other spondylosis with radiculopathy, cervical region: Secondary | ICD-10-CM | POA: Diagnosis not present

## 2022-02-10 DIAGNOSIS — Z7985 Long-term (current) use of injectable non-insulin antidiabetic drugs: Secondary | ICD-10-CM | POA: Diagnosis not present

## 2022-02-10 DIAGNOSIS — E119 Type 2 diabetes mellitus without complications: Secondary | ICD-10-CM | POA: Diagnosis not present

## 2022-02-10 DIAGNOSIS — M542 Cervicalgia: Secondary | ICD-10-CM | POA: Diagnosis not present

## 2022-02-10 DIAGNOSIS — G3184 Mild cognitive impairment, so stated: Secondary | ICD-10-CM | POA: Diagnosis not present

## 2022-02-11 DIAGNOSIS — M542 Cervicalgia: Secondary | ICD-10-CM | POA: Diagnosis not present

## 2022-02-11 DIAGNOSIS — G3184 Mild cognitive impairment, so stated: Secondary | ICD-10-CM | POA: Diagnosis not present

## 2022-02-11 DIAGNOSIS — Z9181 History of falling: Secondary | ICD-10-CM | POA: Diagnosis not present

## 2022-02-11 DIAGNOSIS — I10 Essential (primary) hypertension: Secondary | ICD-10-CM | POA: Diagnosis not present

## 2022-02-11 DIAGNOSIS — Z7985 Long-term (current) use of injectable non-insulin antidiabetic drugs: Secondary | ICD-10-CM | POA: Diagnosis not present

## 2022-02-11 DIAGNOSIS — Z87891 Personal history of nicotine dependence: Secondary | ICD-10-CM | POA: Diagnosis not present

## 2022-02-11 DIAGNOSIS — E785 Hyperlipidemia, unspecified: Secondary | ICD-10-CM | POA: Diagnosis not present

## 2022-02-11 DIAGNOSIS — R569 Unspecified convulsions: Secondary | ICD-10-CM | POA: Diagnosis not present

## 2022-02-11 DIAGNOSIS — E119 Type 2 diabetes mellitus without complications: Secondary | ICD-10-CM | POA: Diagnosis not present

## 2022-02-11 DIAGNOSIS — G713 Mitochondrial myopathy, not elsewhere classified: Secondary | ICD-10-CM | POA: Diagnosis not present

## 2022-02-11 DIAGNOSIS — E538 Deficiency of other specified B group vitamins: Secondary | ICD-10-CM | POA: Diagnosis not present

## 2022-02-11 DIAGNOSIS — K219 Gastro-esophageal reflux disease without esophagitis: Secondary | ICD-10-CM | POA: Diagnosis not present

## 2022-02-11 DIAGNOSIS — M4722 Other spondylosis with radiculopathy, cervical region: Secondary | ICD-10-CM | POA: Diagnosis not present

## 2022-02-11 DIAGNOSIS — D751 Secondary polycythemia: Secondary | ICD-10-CM | POA: Diagnosis not present

## 2022-02-12 ENCOUNTER — Encounter: Payer: Self-pay | Admitting: Gastroenterology

## 2022-02-12 DIAGNOSIS — M542 Cervicalgia: Secondary | ICD-10-CM | POA: Diagnosis not present

## 2022-02-12 DIAGNOSIS — Z9181 History of falling: Secondary | ICD-10-CM | POA: Diagnosis not present

## 2022-02-12 DIAGNOSIS — K219 Gastro-esophageal reflux disease without esophagitis: Secondary | ICD-10-CM | POA: Diagnosis not present

## 2022-02-12 DIAGNOSIS — G3184 Mild cognitive impairment, so stated: Secondary | ICD-10-CM | POA: Diagnosis not present

## 2022-02-12 DIAGNOSIS — E538 Deficiency of other specified B group vitamins: Secondary | ICD-10-CM | POA: Diagnosis not present

## 2022-02-12 DIAGNOSIS — E119 Type 2 diabetes mellitus without complications: Secondary | ICD-10-CM | POA: Diagnosis not present

## 2022-02-12 DIAGNOSIS — I10 Essential (primary) hypertension: Secondary | ICD-10-CM | POA: Diagnosis not present

## 2022-02-12 DIAGNOSIS — E785 Hyperlipidemia, unspecified: Secondary | ICD-10-CM | POA: Diagnosis not present

## 2022-02-12 DIAGNOSIS — M4722 Other spondylosis with radiculopathy, cervical region: Secondary | ICD-10-CM | POA: Diagnosis not present

## 2022-02-12 DIAGNOSIS — Z7985 Long-term (current) use of injectable non-insulin antidiabetic drugs: Secondary | ICD-10-CM | POA: Diagnosis not present

## 2022-02-12 DIAGNOSIS — Z87891 Personal history of nicotine dependence: Secondary | ICD-10-CM | POA: Diagnosis not present

## 2022-02-12 DIAGNOSIS — D751 Secondary polycythemia: Secondary | ICD-10-CM | POA: Diagnosis not present

## 2022-02-12 DIAGNOSIS — R569 Unspecified convulsions: Secondary | ICD-10-CM | POA: Diagnosis not present

## 2022-02-12 DIAGNOSIS — G713 Mitochondrial myopathy, not elsewhere classified: Secondary | ICD-10-CM | POA: Diagnosis not present

## 2022-02-13 ENCOUNTER — Other Ambulatory Visit: Payer: Self-pay | Admitting: Gastroenterology

## 2022-02-13 ENCOUNTER — Encounter: Payer: Self-pay | Admitting: Gastroenterology

## 2022-02-13 MED ORDER — SUCRALFATE 1 GM/10ML PO SUSP: 1.0000 g | Freq: Four times a day (QID) | ORAL | 1 refills | Status: DC

## 2022-02-13 NOTE — Telephone Encounter (Signed)
Sent referral to [facility]

## 2022-02-16 ENCOUNTER — Encounter: Payer: Self-pay | Admitting: Family Medicine

## 2022-02-16 NOTE — Telephone Encounter (Signed)
Called and got them to change referral to urgent

## 2022-02-17 ENCOUNTER — Other Ambulatory Visit: Payer: Self-pay

## 2022-02-17 DIAGNOSIS — E538 Deficiency of other specified B group vitamins: Secondary | ICD-10-CM | POA: Diagnosis not present

## 2022-02-17 DIAGNOSIS — G713 Mitochondrial myopathy, not elsewhere classified: Secondary | ICD-10-CM | POA: Diagnosis not present

## 2022-02-17 DIAGNOSIS — Z87891 Personal history of nicotine dependence: Secondary | ICD-10-CM | POA: Diagnosis not present

## 2022-02-17 DIAGNOSIS — M4722 Other spondylosis with radiculopathy, cervical region: Secondary | ICD-10-CM | POA: Diagnosis not present

## 2022-02-17 DIAGNOSIS — R569 Unspecified convulsions: Secondary | ICD-10-CM | POA: Diagnosis not present

## 2022-02-17 DIAGNOSIS — Z9181 History of falling: Secondary | ICD-10-CM | POA: Diagnosis not present

## 2022-02-17 DIAGNOSIS — K219 Gastro-esophageal reflux disease without esophagitis: Secondary | ICD-10-CM | POA: Diagnosis not present

## 2022-02-17 DIAGNOSIS — M6281 Muscle weakness (generalized): Secondary | ICD-10-CM

## 2022-02-17 DIAGNOSIS — E785 Hyperlipidemia, unspecified: Secondary | ICD-10-CM | POA: Diagnosis not present

## 2022-02-17 DIAGNOSIS — G3184 Mild cognitive impairment, so stated: Secondary | ICD-10-CM | POA: Diagnosis not present

## 2022-02-17 DIAGNOSIS — E8840 Mitochondrial metabolism disorder, unspecified: Secondary | ICD-10-CM

## 2022-02-17 DIAGNOSIS — I10 Essential (primary) hypertension: Secondary | ICD-10-CM | POA: Diagnosis not present

## 2022-02-17 DIAGNOSIS — E119 Type 2 diabetes mellitus without complications: Secondary | ICD-10-CM | POA: Diagnosis not present

## 2022-02-17 DIAGNOSIS — Z7985 Long-term (current) use of injectable non-insulin antidiabetic drugs: Secondary | ICD-10-CM | POA: Diagnosis not present

## 2022-02-17 DIAGNOSIS — D751 Secondary polycythemia: Secondary | ICD-10-CM | POA: Diagnosis not present

## 2022-02-17 DIAGNOSIS — M542 Cervicalgia: Secondary | ICD-10-CM | POA: Diagnosis not present

## 2022-02-18 ENCOUNTER — Encounter: Payer: Self-pay | Admitting: Gastroenterology

## 2022-02-18 ENCOUNTER — Telehealth: Payer: Self-pay

## 2022-02-18 NOTE — Telephone Encounter (Signed)
Returned call and left verbal order

## 2022-02-18 NOTE — Telephone Encounter (Signed)
Copied from [place] [id number]. Topic: General - Inquiry >> Feb 18, 2022  9:04 AM Oneta Rack wrote: Requesting speech therapy evaluation orders

## 2022-02-19 ENCOUNTER — Encounter: Payer: Self-pay | Admitting: Family Medicine

## 2022-02-19 DIAGNOSIS — E785 Hyperlipidemia, unspecified: Secondary | ICD-10-CM | POA: Diagnosis not present

## 2022-02-19 DIAGNOSIS — I10 Essential (primary) hypertension: Secondary | ICD-10-CM | POA: Diagnosis not present

## 2022-02-19 DIAGNOSIS — G713 Mitochondrial myopathy, not elsewhere classified: Secondary | ICD-10-CM | POA: Diagnosis not present

## 2022-02-19 DIAGNOSIS — E538 Deficiency of other specified B group vitamins: Secondary | ICD-10-CM | POA: Diagnosis not present

## 2022-02-19 DIAGNOSIS — K219 Gastro-esophageal reflux disease without esophagitis: Secondary | ICD-10-CM | POA: Diagnosis not present

## 2022-02-19 DIAGNOSIS — M542 Cervicalgia: Secondary | ICD-10-CM | POA: Diagnosis not present

## 2022-02-19 DIAGNOSIS — M4722 Other spondylosis with radiculopathy, cervical region: Secondary | ICD-10-CM | POA: Diagnosis not present

## 2022-02-19 DIAGNOSIS — D751 Secondary polycythemia: Secondary | ICD-10-CM | POA: Diagnosis not present

## 2022-02-19 DIAGNOSIS — G3184 Mild cognitive impairment, so stated: Secondary | ICD-10-CM | POA: Diagnosis not present

## 2022-02-19 DIAGNOSIS — Z7985 Long-term (current) use of injectable non-insulin antidiabetic drugs: Secondary | ICD-10-CM | POA: Diagnosis not present

## 2022-02-19 DIAGNOSIS — Z87891 Personal history of nicotine dependence: Secondary | ICD-10-CM | POA: Diagnosis not present

## 2022-02-19 DIAGNOSIS — R569 Unspecified convulsions: Secondary | ICD-10-CM | POA: Diagnosis not present

## 2022-02-19 DIAGNOSIS — E119 Type 2 diabetes mellitus without complications: Secondary | ICD-10-CM | POA: Diagnosis not present

## 2022-02-19 DIAGNOSIS — Z9181 History of falling: Secondary | ICD-10-CM | POA: Diagnosis not present

## 2022-02-20 ENCOUNTER — Other Ambulatory Visit: Payer: Self-pay

## 2022-02-20 DIAGNOSIS — K219 Gastro-esophageal reflux disease without esophagitis: Secondary | ICD-10-CM

## 2022-02-20 DIAGNOSIS — R634 Abnormal weight loss: Secondary | ICD-10-CM

## 2022-02-20 DIAGNOSIS — R1310 Dysphagia, unspecified: Secondary | ICD-10-CM

## 2022-02-23 ENCOUNTER — Encounter: Payer: Self-pay | Admitting: Family Medicine

## 2022-02-23 ENCOUNTER — Other Ambulatory Visit: Payer: Self-pay | Admitting: Family Medicine

## 2022-02-24 ENCOUNTER — Encounter: Payer: Self-pay | Admitting: Internal Medicine

## 2022-02-24 ENCOUNTER — Encounter: Payer: Medicaid Other | Admitting: Internal Medicine

## 2022-02-24 ENCOUNTER — Encounter (HOSPITAL_COMMUNITY): Payer: Self-pay | Admitting: Gastroenterology

## 2022-02-24 VITALS — HR 93 | Resp 16 | Ht 71.0 in | Wt 208.7 lb

## 2022-02-24 DIAGNOSIS — M545 Low back pain, unspecified: Secondary | ICD-10-CM

## 2022-02-24 DIAGNOSIS — R309 Painful micturition, unspecified: Secondary | ICD-10-CM

## 2022-02-24 NOTE — Progress Notes (Deleted)
Acute Office Visit  Subjective:    Patient ID: [name] [name], male    DOB: [birth year], 61 y.o.   MRN: [id number]  No chief complaint on file.   HPI Patient is in today for concerns about possible UTI.  URINARY SYMPTOMS  Dysuria: {Blank single:19197::"yes","no","burning"} Urinary frequency: {Blank single:19197::"yes","no"} Urgency: {Blank single:19197::"yes","no"} Small volume voids: {Blank single:19197::"yes","no"} Symptom severity: {Blank single:19197::"yes","no"} Urinary incontinence: {Blank single:19197::"yes","no"} Foul odor: {Blank single:19197::"yes","no"} Hematuria: {Blank single:19197::"yes","no"} Abdominal pain: {Blank single:19197::"yes","no"} Back pain: {Blank single:19197::"yes","no"} Suprapubic pain/pressure: {Blank single:19197::"yes","no"} Flank pain: {Blank single:19197::"yes","no"} Fever:  {Blank multiple:19196::"yes","no","subjective","low grade"} Vomiting: {Blank single:19197::"yes","no"} Relief with cranberry juice: {Blank single:19197::"yes","no"} Relief with pyridium: {Blank single:19197::"yes","no"} Status: better/worse/stable Previous urinary tract infection: {Blank single:19197::"yes","no"} Recurrent urinary tract infection: {Blank single:19197::"yes","no"} Sexual activity: No sexually active/monogomous/practicing safe sex History of sexually transmitted disease: {Blank single:19197::"yes","no"} Penile discharge: {Blank single:19197::"yes","no"} Treatments attempted: {Blank multiple:19196::"none","antibiotics","pyridium","cranberry","increasing fluids"}    Past Medical History:  Diagnosis Date   Allergy    Angina, class III (HCC)    Chronic constipation    Degeneration of intervertebral disc of cervical region    Diabetes mellitus without complication (HCC)    Diastolic dysfunction    a. 06/2017 Echo: EF 60-65%, no rwma, Gr1 DD.   Dyspnea    GERD (gastroesophageal reflux disease)    Hernia 1991   02/10/2012-RIH repair   Hyperlipidemia     Hypertension 2008   Nerve root pain    Neuropathy    Non-obstructive CAD (coronary artery disease)    a. 05/30/2015 cath: LM nl, mLAD 50% (FFR 0.83), LCx minor irregs, RCA minor irregs, EF 55-65%-->Med Rx; b. 03/2016 Cath: LM nl LAD 33m D1/2/3 min irregs, LCX min irregs, OM1/2/3 nl, RCA min irregs, RPDA/RPAV/RPL1/RPL2 nl-->Med Rx; b. 07/2020 Cath: LM nl, LAD 332mD1/2/3 min irregs, LCX min irregs, RCA min irregs, RPDA/RPAV/RPL1,2 nl, EF 55-65%.   Obesity, unspecified 2012   Personal history of tobacco use, presenting hazards to health 2012   Polycythemia    Rectal bleeding 02/11/2017   Recurrent Right Inguinal Hernia Repair 02/09/2011, [facility]07/2013   [facility] Shortness of breath    Sleep apnea    a. On CPAP;  b. 06/2017 Echo: no PAH.   Umbilical hernia without mention of obstruction or gangrene 02/09/2011   [facility] Vitamin D deficiency     Past Surgical History:  Procedure Laterality Date   BACK SURGERY  12/2008   [facility] CARDIAC CATHETERIZATION N/A 05/30/2015   Procedure: Left Heart Cath;  Surgeon: MuWellington HampshireMD;  [facility];  Service: Cardiovascular;  Laterality: N/A;   CARDIAC CATHETERIZATION N/A 04/16/2016   Procedure: Left Heart Cath and Coronary Angiography;  Surgeon: MuWellington HampshireMD;  [facility];  Service: Cardiovascular;  Laterality: N/A;   COLONOSCOPY  2014   Dr. SaJamal Collin COLONOSCOPY WITH PROPOFOL N/A 12/25/2016   Procedure: COLONOSCOPY WITH PROPOFOL;  Surgeon: KiJonathon BellowsMD;  Location: [facility] ENDOSCOPY;  Service: Endoscopy;  Laterality: N/A;   COLONOSCOPY WITH PROPOFOL N/A 01/29/2017   Procedure: COLONOSCOPY WITH PROPOFOL;  Surgeon: KiJonathon BellowsMD;  Location: [facility] ENDOSCOPY;  Service: Endoscopy;  Laterality: N/A;   COLONOSCOPY WITH PROPOFOL N/A 04/11/2020   Procedure: COLONOSCOPY WITH PROPOFOL;  Surgeon: AnJonathon BellowsMD;  [facility];  Service: Gastroenterology;  Laterality: N/A;    ESOPHAGOGASTRODUODENOSCOPY (EGD) WITH PROPOFOL N/A 01/26/2019   Procedure: ESOPHAGOGASTRODUODENOSCOPY (EGD) WITH PROPOFOL;  Surgeon: VaLin LandsmanMD;  [facility];  Service: Gastroenterology;  Laterality: N/A;   ESOPHAGOGASTRODUODENOSCOPY (EGD) WITH PROPOFOL N/A 09/10/2021   Procedure: ESOPHAGOGASTRODUODENOSCOPY (EGD) WITH PROPOFOL;  Surgeon: Lin Landsman, MD;  Location: [facility] ENDOSCOPY;  Service: Gastroenterology;  Laterality: N/A;   EVALUATION UNDER ANESTHESIA WITH HEMORRHOIDECTOMY N/A 02/24/2017   Procedure: EXAM UNDER ANESTHESIA WITH POSSIBLE EXCISION OF INTERNAL HEMORRHOIDS;  Surgeon: Olean Ree, MD;  Location: [facility] ORS;  Service: General;  Laterality: N/A;   FISSURECTOMY  02/24/2017   Procedure: FISSURECTOMY;  Surgeon: Olean Ree, MD;  Location: [facility] ORS;  Service: General;;   HAND SURGERY Left 2020   HERNIA REPAIR Right 1991   INGUINAL HERNIA REPAIR Right 2012   Dr Teryl Lucy HERNIA REPAIR Right 2014   Dr [name]   INTRAVASCULAR PRESSURE WIRE/FFR STUDY N/A 07/30/2021   Procedure: INTRAVASCULAR PRESSURE WIRE/FFR STUDY;  Surgeon: Wellington Hampshire, MD;  Location: [place] CV LAB;  Service: Cardiovascular;  Laterality: N/A;   LEFT HEART CATH AND CORONARY ANGIOGRAPHY Left 08/05/2020   Procedure: LEFT HEART CATH AND CORONARY ANGIOGRAPHY poss PCI;  Surgeon: Wellington Hampshire, MD;  Location: [place] CV LAB;  Service: Cardiovascular;  Laterality: Left;   MUSCLE BIOPSY Left 12/31/2021   Procedure: LEFT VASTUS LATERALIS BIOPSY;  Surgeon: Meade Maw, MD;  Location: [facility] ORS;  Service: Neurosurgery;  Laterality: Left;   RIGHT/LEFT HEART CATH AND CORONARY ANGIOGRAPHY N/A 07/30/2021   Procedure: RIGHT/LEFT HEART CATH AND CORONARY ANGIOGRAPHY;  Surgeon: Wellington Hampshire, MD;  Location: [place] CV LAB;  Service: Cardiovascular;  Laterality: N/A;    Family History  Problem Relation Age of Onset   Hypertension Mother    Heart Problems Mother    High  Cholesterol Mother    Heart Problems Father 54       myocardial infarction   Mental illness Brother    Other Maternal Aunt        COVID   Lung cancer Maternal Aunt    Lung cancer Maternal Aunt    Cancer Maternal Uncle        unknown   Prostate cancer Maternal Uncle    Testicular cancer Paternal Uncle    Thyroid disease Daughter    Breast cancer Cousin     Social History   Socioeconomic History   Marital status: Single    Spouse name: Not on file   Number of children: Not on file   Years of education: Not on file   Highest education level: Not on file  Occupational History   Not on file  Tobacco Use   Smoking status: Former    Types: Cigars    Start date: 12/28/2002    Quit date: 06/04/2005    Years since quitting: 16.7   Smokeless tobacco: Never   Tobacco comments:    quit 2006-smoked 1 cigar occ  Vaping Use   Vaping Use: Never used  Substance and Sexual Activity   Alcohol use: Not Currently    Comment: has not had a drink in 8 months 02/2021   Drug use: No   Sexual activity: Yes    Partners: Female  Other Topics Concern   Not on file  Social History Narrative   Not on file   Social Determinants of Health   Financial Resource Strain: Not on file  Food Insecurity: Not on file  Transportation Needs: Not on file  Physical Activity: Not on file  Stress: Not on file  Social Connections: Not on file  Intimate Partner Violence: Not on file  Outpatient Medications Prior to Visit  Medication Sig Dispense Refill   ACCU-CHEK GUIDE test strip USE TO TEST BLOOD SUGAR THREE TIMES DAILY AS NEEDED 100 strip 1   Accu-Chek Softclix Lancets lancets TEST TWICE DAILY 100 each 3   blood glucose meter kit and supplies 1 each by Other route in the morning and at bedtime. Dispense based on patient and insurance preference. Use up to two  times daily. (FOR ICD-10 E10.9, E11.9). 1 each 0   carvedilol (COREG) 6.25 MG tablet TAKE 1 TABLET(6.25 MG) BY MOUTH TWICE DAILY 180 tablet 1    dapagliflozin propanediol (FARXIGA) 10 MG TABS tablet Take 1 tablet (10 mg total) by mouth daily before breakfast. 90 tablet 1   ezetimibe (ZETIA) 10 MG tablet Take 1 tablet (10 mg total) by mouth daily. 90 tablet 1   fluticasone (FLONASE) 50 MCG/ACT nasal spray Place 1 spray into both nostrils daily as needed for allergies or rhinitis.     ketoconazole (NIZORAL) 2 % shampoo Apply 1 application topically 3 (three) times a week. Wash scalp/body 3 times a week as needed for alres, let sit 5 minutes before rinsing off, avoid eyes. May use once monthly for maintenance. (Patient taking differently: Apply 1 application topically every three (3) days as needed (scalp psoriasis.). Wash scalp/body 3 times a week as needed for alres, let sit 5 minutes before rinsing off, avoid eyes. May use once monthly for maintenance.) 120 mL 11   losartan-hydrochlorothiazide (HYZAAR) 50-12.5 MG tablet TAKE 1 TABLET BY MOUTH DAILY 30 tablet 0   mupirocin ointment (BACTROBAN) 2 % Apply 1 application topically daily. Qd to excision site (Patient taking differently: Apply 1 application topically daily as needed (skin irritation.). Qd to excision site) 22 g 1   nitroGLYCERIN (NITROSTAT) 0.4 MG SL tablet Place 1 tablet (0.4 mg total) under the tongue every 5 (five) minutes as needed for chest pain.     omeprazole (PRILOSEC) 40 MG capsule TAKE 1 CAPSULE(40 MG) BY MOUTH TWICE DAILY BEFORE A MEAL 60 capsule 2   pregabalin (LYRICA) 50 MG capsule Take 1 capsule (50 mg total) by mouth 3 (three) times daily. 270 capsule 1   ranolazine (RANEXA) 1000 MG SR tablet Take 1 tablet (1,000 mg total) by mouth 2 (two) times daily. 180 tablet 3   sucralfate (CARAFATE) 1 GM/10ML suspension Take 10 mLs (1 g total) by mouth 4 (four) times daily. 420 mL 1   temazepam (RESTORIL) 15 MG capsule Take 1 capsule (15 mg total) by mouth at bedtime as needed for sleep. 30 capsule 0   No facility-administered medications prior to visit.    Allergies  Allergen  Reactions   Gabapentin Other (See Comments)    Groggy-Mood Changes   Latex Rash   Penicillins Hives and Rash    Has patient had a PCN reaction causing immediate rash, facial/tongue/throat swelling, SOB or lightheadedness with hypotension: Yes Has patient had a PCN reaction causing severe rash involving mucus membranes or skin necrosis: No Has patient had a PCN reaction that required hospitalization No Has patient had a PCN reaction occurring within the last 10 years: No If all of the above answers are "NO", then may proceed with Cephalosporin use.     Review of Systems     Objective:    Physical Exam  There were no vitals taken for this visit. Wt Readings from Last 3 Encounters:  02/06/22 209 lb (94.8 kg)  01/19/22 210 lb (95.3 kg)  12/31/21 207 lb (93.9 kg)  Health Maintenance Due  Topic Date Due   OPHTHALMOLOGY EXAM  04/18/2021   COVID-19 Vaccine (5 - Booster for Pfizer series) 07/18/2021    There are no preventive care reminders to display for this patient.   Lab Results  Component Value Date   TSH 0.90 10/07/2021   Lab Results  Component Value Date   WBC 10.4 12/26/2021   HGB 17.8 (H) 12/26/2021   HCT 53.2 (H) 12/26/2021   MCV 91.6 12/26/2021   PLT 243 12/26/2021   Lab Results  Component Value Date   NA 141 12/26/2021   K 3.6 12/26/2021   CO2 26 12/26/2021   GLUCOSE 105 (H) 12/26/2021   BUN 19 12/26/2021   CREATININE 0.80 12/26/2021   BILITOT 0.7 07/21/2021   ALKPHOS 61 02/28/2021   AST 37 (H) 07/21/2021   ALT 30 07/21/2021   PROT 7.6 11/10/2021   ALBUMIN 4.3 02/28/2021   CALCIUM 9.5 12/26/2021   ANIONGAP 10 12/26/2021   EGFR 101 07/21/2021   Lab Results  Component Value Date   CHOL 129 07/21/2021   Lab Results  Component Value Date   HDL 56 07/21/2021   Lab Results  Component Value Date   LDLCALC 57 07/21/2021   Lab Results  Component Value Date   TRIG 79 07/21/2021   Lab Results  Component Value Date   CHOLHDL 2.3 07/21/2021    Lab Results  Component Value Date   HGBA1C 5.5 02/06/2022       Assessment & Plan:   Problem List Items Addressed This Visit   None    No orders of the defined types were placed in this encounter.    Teodora Medici, DO

## 2022-02-24 NOTE — Telephone Encounter (Signed)
Appt made for 11:20 02-24-2022 . Patient notified

## 2022-02-24 NOTE — Progress Notes (Signed)
This encounter was created in error - please disregard.

## 2022-02-25 ENCOUNTER — Other Ambulatory Visit: Payer: Self-pay | Admitting: Gastroenterology

## 2022-02-25 DIAGNOSIS — R1319 Other dysphagia: Secondary | ICD-10-CM

## 2022-02-25 DIAGNOSIS — R634 Abnormal weight loss: Secondary | ICD-10-CM

## 2022-02-25 NOTE — Progress Notes (Signed)
Name: [name] [name]   MRN: [id number]    DOB: 1961-09-04   Date:02/26/2022       Progress Note  Subjective  Chief Complaint  UTI Symptoms  HPI  Dysuria: he has mild burning in the beginning of micturition over the past month, symptoms are intermittent. He states not sexually active for 9 months and same partner for the past 18 years. He has mild low back pain - but he has a history of back pain. No rashes, fever or chills. Nausea or vomiting  Patient Active Problem List   Diagnosis Date Noted   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   Tremor 01/22/2022   Weakness 01/22/2022   Syncope 01/22/2022   Shortness of breath 01/22/2022   Elevated CK 11/10/2021   Muscle atrophy of lower extremity 11/10/2021   Pica 11/10/2021   Loss of weight    Dysphagia    SOB (shortness of breath)    Asthma 05/14/2021   Multiple thyroid nodules 05/14/2021   Localized, primary osteoarthritis of hand 09/28/2019   Seizure ([place]) 02/22/2018   Mild cognitive impairment 11/17/2017   Radiculopathy of cervical region 11/17/2017   Stenosis of carotid artery 09/08/2017   Cervical stenosis of spine 09/08/2017   Headache disorder 02/15/2017   Memory loss or impairment 02/15/2017   Coronary artery disease involving native coronary artery of native heart    Radiculitis of left cervical region 03/05/2016   Diabetic neuropathy associated with type 2 diabetes mellitus ([place]) 12/05/2015   Patellar subluxation 06/05/2015   Allergic rhinitis, seasonal 06/05/2015   Chronic constipation 06/05/2015   Chronic lower back pain 06/05/2015   Vitamin D deficiency 06/05/2015   Central sleep apnea 06/05/2015   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   Nerve root pain 06/05/2015   Acquired polycythemia 06/05/2015   Anterior knee pain 38/45/3646   Dysmetabolic syndrome 80/32/1224   Failure of erection 06/05/2015   Gastro-esophageal reflux disease without esophagitis 06/05/2015   Neuropathy 06/05/2015   CAD (coronary artery disease)    Angina,  class III ([place]) 05/23/2015   Essential hypertension 05/23/2015   Hyperlipidemia 05/23/2015   Inguinal hernia without mention of obstruction or gangrene, recurrent unilateral or unspecified 03/24/2013    Past Surgical History:  Procedure Laterality Date   BACK SURGERY  12/2008   X2-LUMBAR   CARDIAC CATHETERIZATION N/A 05/30/2015   Procedure: Left Heart Cath;  Surgeon: Wellington Hampshire, MD;  Location: [place] [facility];  Service: Cardiovascular;  Laterality: N/A;   CARDIAC CATHETERIZATION N/A 04/16/2016   Procedure: Left Heart Cath and Coronary Angiography;  Surgeon: Wellington Hampshire, MD;  Location: [place] [facility];  Service: Cardiovascular;  Laterality: N/A;   COLONOSCOPY  2014   Dr. [name]   COLONOSCOPY WITH PROPOFOL N/A 12/25/2016   Procedure: COLONOSCOPY WITH PROPOFOL;  Surgeon: Jonathon Bellows, MD;  Location: [facility];  Service: Endoscopy;  Laterality: N/A;   COLONOSCOPY WITH PROPOFOL N/A 01/29/2017   Procedure: COLONOSCOPY WITH PROPOFOL;  Surgeon: Jonathon Bellows, MD;  Location: [facility];  Service: Endoscopy;  Laterality: N/A;   COLONOSCOPY WITH PROPOFOL N/A 04/11/2020   Procedure: COLONOSCOPY WITH PROPOFOL;  Surgeon: Jonathon Bellows, MD;  Location: [facility] ENDOSCOPY;  Service: Gastroenterology;  Laterality: N/A;   ESOPHAGOGASTRODUODENOSCOPY (EGD) WITH PROPOFOL N/A 01/26/2019   Procedure: ESOPHAGOGASTRODUODENOSCOPY (EGD) WITH PROPOFOL;  Surgeon: Lin Landsman, MD;  Location: [facility];  Service: Gastroenterology;  Laterality: N/A;   ESOPHAGOGASTRODUODENOSCOPY (EGD) WITH PROPOFOL N/A 09/10/2021   Procedure: ESOPHAGOGASTRODUODENOSCOPY (EGD) WITH PROPOFOL;  Surgeon: Lin Landsman, MD;  Location: [facility] ENDOSCOPY;  Service: Gastroenterology;  Laterality: N/A;   EVALUATION UNDER ANESTHESIA WITH HEMORRHOIDECTOMY N/A 02/24/2017   Procedure: EXAM UNDER ANESTHESIA WITH POSSIBLE EXCISION OF INTERNAL HEMORRHOIDS;  Surgeon: Olean Ree, MD;  Location: [facility];  Service: General;   Laterality: N/A;   FISSURECTOMY  02/24/2017   Procedure: FISSURECTOMY;  Surgeon: Olean Ree, MD;  Location: [facility];  Service: General;;   HAND SURGERY Left 2020   HERNIA REPAIR Right 1991   INGUINAL HERNIA REPAIR Right 2012   Dr Teryl Lucy HERNIA REPAIR Right 2014   Dr [name]   INTRAVASCULAR PRESSURE WIRE/FFR STUDY N/A 07/30/2021   Procedure: INTRAVASCULAR PRESSURE WIRE/FFR STUDY;  Surgeon: Wellington Hampshire, MD;  Location: [place] [facility];  Service: Cardiovascular;  Laterality: N/A;   LEFT HEART CATH AND CORONARY ANGIOGRAPHY Left 08/05/2020   Procedure: LEFT HEART CATH AND CORONARY ANGIOGRAPHY poss PCI;  Surgeon: Wellington Hampshire, MD;  Location: [place] [facility];  Service: Cardiovascular;  Laterality: Left;   MUSCLE BIOPSY Left 12/31/2021   Procedure: LEFT VASTUS LATERALIS BIOPSY;  Surgeon: Meade Maw, MD;  Location: [facility];  Service: Neurosurgery;  Laterality: Left;   RIGHT/LEFT HEART CATH AND CORONARY ANGIOGRAPHY N/A 07/30/2021   Procedure: RIGHT/LEFT HEART CATH AND CORONARY ANGIOGRAPHY;  Surgeon: Wellington Hampshire, MD;  Location: [place] [facility];  Service: Cardiovascular;  Laterality: N/A;    Family History  Problem Relation Age of Onset   Hypertension Mother    Heart Problems Mother    High Cholesterol Mother    Heart Problems Father 83       myocardial infarction   Mental illness Brother    Other Maternal Aunt        COVID   Lung cancer Maternal Aunt    Lung cancer Maternal Aunt    Cancer Maternal Uncle        unknown   Prostate cancer Maternal Uncle    Testicular cancer Paternal Uncle    Thyroid disease Daughter    Breast cancer Cousin     Social History   Tobacco Use   Smoking status: Former    Types: Cigars    Start date: 12/28/2002    Quit date: 06/04/2005    Years since quitting: 16.7   Smokeless tobacco: Never   Tobacco comments:    quit 2006-smoked 1 cigar occ  Substance Use Topics   Alcohol use: Not Currently    Comment: has not had  a drink in 8 months 02/2021     Current Outpatient Medications:    ACCU-CHEK GUIDE test strip, USE TO TEST BLOOD SUGAR THREE TIMES DAILY AS NEEDED, Disp: 100 strip, Rfl: 1   Accu-Chek Softclix Lancets lancets, TEST TWICE DAILY, Disp: 100 each, Rfl: 3   blood glucose meter kit and supplies, 1 each by Other route in the morning and at bedtime. Dispense based on patient and insurance preference. Use up to two  times daily. (FOR ICD-10 E10.9, E11.9)., Disp: 1 each, Rfl: 0   carvedilol (COREG) 6.25 MG tablet, TAKE 1 TABLET(6.25 MG) BY MOUTH TWICE DAILY, Disp: 180 tablet, Rfl: 1   dapagliflozin propanediol (FARXIGA) 10 MG TABS tablet, Take 1 tablet (10 mg total) by mouth daily before breakfast., Disp: 90 tablet, Rfl: 1   ezetimibe (ZETIA) 10 MG tablet, Take 1 tablet (10 mg total) by mouth daily., Disp: 90 tablet, Rfl: 1   fluticasone (FLONASE) 50 MCG/ACT nasal spray, Place 1 spray into both nostrils daily as needed for  allergies or rhinitis., Disp: , Rfl:    ketoconazole (NIZORAL) 2 % shampoo, Apply 1 application topically 3 (three) times a week. Wash scalp/body 3 times a week as needed for alres, let sit 5 minutes before rinsing off, avoid eyes. May use once monthly for maintenance. (Patient taking differently: Apply 1 application topically every three (3) days as needed (scalp psoriasis.). Wash scalp/body 3 times a week as needed for alres, let sit 5 minutes before rinsing off, avoid eyes. May use once monthly for maintenance.), Disp: 120 mL, Rfl: 11   losartan-hydrochlorothiazide (HYZAAR) 50-12.5 MG tablet, TAKE 1 TABLET BY MOUTH DAILY, Disp: 30 tablet, Rfl: 0   mupirocin ointment (BACTROBAN) 2 %, Apply 1 application topically daily. Qd to excision site (Patient taking differently: Apply 1 application topically daily as needed (skin irritation.). Qd to excision site), Disp: 22 g, Rfl: 1   nitroGLYCERIN (NITROSTAT) 0.4 MG SL tablet, Place 1 tablet (0.4 mg total) under the tongue every 5 (five) minutes as  needed for chest pain., Disp: , Rfl:    omeprazole (PRILOSEC) 40 MG capsule, TAKE 1 CAPSULE(40 MG) BY MOUTH TWICE DAILY BEFORE A MEAL, Disp: 60 capsule, Rfl: 2   pregabalin (LYRICA) 50 MG capsule, Take 1 capsule (50 mg total) by mouth 3 (three) times daily., Disp: 270 capsule, Rfl: 1   ranolazine (RANEXA) 1000 MG SR tablet, Take 1 tablet (1,000 mg total) by mouth 2 (two) times daily., Disp: 180 tablet, Rfl: 3   sucralfate (CARAFATE) 1 GM/10ML suspension, Take 10 mLs (1 g total) by mouth 4 (four) times daily., Disp: 420 mL, Rfl: 1   temazepam (RESTORIL) 15 MG capsule, Take 1 capsule (15 mg total) by mouth at bedtime as needed for sleep., Disp: 30 capsule, Rfl: 0  Allergies  Allergen Reactions   Gabapentin Other (See Comments)    Groggy-Mood Changes   Latex Rash   Penicillins Hives and Rash    Has patient had a PCN reaction causing immediate rash, facial/tongue/throat swelling, SOB or lightheadedness with hypotension: Yes Has patient had a PCN reaction causing severe rash involving mucus membranes or skin necrosis: No Has patient had a PCN reaction that required hospitalization No Has patient had a PCN reaction occurring within the last 10 years: No If all of the above answers are "NO", then may proceed with Cephalosporin use.     I personally reviewed active problem list, medication list, allergies, family history, social history with the patient/caregiver today.   ROS  Ten systems reviewed and is negative except as mentioned in HPI   Objective  Vitals:   02/26/22 1045  BP: 126/80  Pulse: 92  Resp: 16  SpO2: 98%  Weight: 209 lb (94.8 kg)  Height: '5\' 11"'  (1.803 m)    Body mass index is 29.15 kg/m.  Physical Exam  Constitutional: Patient appears well-developed and well-nourished.  No distress.  HEENT: head atraumatic, normocephalic, pupils equal and reactive to light, neck supple, throat within normal limits Cardiovascular: Normal rate, regular rhythm and normal heart  sounds.  No murmur heard. No BLE edema. Pulmonary/Chest: Effort normal and breath sounds normal. No respiratory distress. Abdominal: Soft.  There is no tenderness. Negative CVA Urological: he didn't want to have exam done  Muscular skeletal: atrophy arms, hands, using a cane Psychiatric: Patient has a normal mood and affect. behavior is normal. Judgment and thought content normal.    Recent Results (from the past 2160 hour(s))  CBC     Status: Abnormal   Collection Time: 12/26/21  2:30 PM  Result Value Ref Range   WBC 10.4 4.0 - 10.5 K/uL   RBC 5.81 4.22 - 5.81 MIL/uL   Hemoglobin 17.8 (H) 13.0 - 17.0 g/dL   HCT 53.2 (H) 39.0 - 52.0 %   MCV 91.6 80.0 - 100.0 fL   MCH 30.6 26.0 - 34.0 pg   MCHC 33.5 30.0 - 36.0 g/dL   RDW 13.7 11.5 - 15.5 %   Platelets 243 150 - 400 K/uL   nRBC 0.0 0.0 - 0.2 %    Comment: Performed at [facility], [address], [place], [place] [address]  Basic metabolic panel per protocol     Status: Abnormal   Collection Time: 12/26/21  2:30 PM  Result Value Ref Range   Sodium 141 135 - 145 mmol/L   Potassium 3.6 3.5 - 5.1 mmol/L   Chloride 105 98 - 111 mmol/L   CO2 26 22 - 32 mmol/L   Glucose, Bld 105 (H) 70 - 99 mg/dL    Comment: Glucose reference range applies only to samples taken after fasting for at least 8 hours.   BUN 19 6 - 20 mg/dL   Creatinine, Ser 0.80 0.61 - 1.24 mg/dL   Calcium 9.5 8.9 - 10.3 mg/dL   GFR, Estimated >60 >60 mL/min    Comment: (NOTE) Calculated using the CKD-EPI Creatinine Equation (2021)    Anion gap 10 5 - 15    Comment: Performed at [facility], [address] [place], [place] [address]  Glucose, capillary     Status: Abnormal   Collection Time: 12/31/21  7:10 AM  Result Value Ref Range   Glucose-Capillary 113 (H) 70 - 99 mg/dL    Comment: Glucose reference range applies only to samples taken after fasting for at least 8 hours.  Glucose, capillary     Status: None   Collection Time: 12/31/21   9:21 AM  Result Value Ref Range   Glucose-Capillary 97 70 - 99 mg/dL    Comment: Glucose reference range applies only to samples taken after fasting for at least 8 hours.  HgB A1c     Status: None   Collection Time: 02/06/22  9:01 AM  Result Value Ref Range   Hgb A1c MFr Bld 5.5 <5.7 % of total Hgb    Comment: For the purpose of screening for the presence of diabetes: . <5.7%       Consistent with the absence of diabetes 5.7-6.4%    Consistent with increased risk for diabetes             (prediabetes) > or =6.5%  Consistent with diabetes . This assay result is consistent with a decreased risk of diabetes. . Currently, no consensus exists regarding use of hemoglobin A1c for diagnosis of diabetes in children. . According to American Diabetes Association (ADA) guidelines, hemoglobin A1c <7.0% represents optimal control in non-pregnant diabetic patients. Different metrics may apply to specific patient populations.  Standards of Medical Care in Diabetes(ADA). .    Mean Plasma Glucose 111 mg/dL   eAG (mmol/L) 6.2 mmol/L  POCT Urinalysis Dipstick     Status: None   Collection Time: 02/26/22 10:49 AM  Result Value Ref Range   Color, UA Yellow    Clarity, UA Clear    Glucose, UA Negative Negative   Bilirubin, UA Negative    Ketones, UA Negative    Spec Grav, UA 1.015 1.010 - 1.025   Blood, UA Negative    pH, UA 6.5  5.0 - 8.0   Protein, UA Negative Negative   Urobilinogen, UA 0.2 0.2 or 1.0 E.U./dL   Nitrite, UA Negative    Leukocytes, UA Negative Negative   Appearance     Odor       PHQ2/9: Depression screen [facility] 2/9 02/26/2022 02/24/2022 02/06/2022 10/07/2021 07/21/2021  Decreased Interest 0 0 0 0 0  Down, Depressed, Hopeless 0 0 0 0 0  PHQ - 2 Score 0 0 0 0 0  Altered sleeping 0 0 0 - -  Tired, decreased energy 0 0 0 - -  Change in appetite 0 0 0 - -  Feeling bad or failure about yourself  0 0 0 - -  Trouble concentrating 0 0 0 - -  Moving slowly or fidgety/restless 0  0 0 - -  Suicidal thoughts 0 0 0 - -  PHQ-9 Score 0 0 0 - -  Difficult doing work/chores - Not difficult at all - - -  Some recent data might be hidden    phq 9 is negative   Fall Risk: Fall Risk  02/26/2022 02/24/2022 02/06/2022 10/07/2021 07/21/2021  Falls in the past year? 0 1 0 1 0  Number falls in past yr: 0 0 0 1 0  Injury with Fall? 0 0 0 1 0  Comment - - - - -  Risk for fall due to : No Fall Risks Impaired balance/gait;Impaired mobility No Fall Risks History of fall(s) -  Follow up Falls prevention discussed - Falls prevention discussed Falls prevention discussed -    Assessment & Plan  1. Dysuria  - POCT Urinalysis Dipstick - CULTURE, URINE COMPREHENSIVE .

## 2022-02-26 ENCOUNTER — Encounter: Payer: Self-pay | Admitting: Family Medicine

## 2022-02-26 ENCOUNTER — Ambulatory Visit (INDEPENDENT_AMBULATORY_CARE_PROVIDER_SITE_OTHER): Payer: Medicaid Other | Admitting: Family Medicine

## 2022-02-26 VITALS — BP 126/80 | HR 92 | Resp 16 | Ht 71.0 in | Wt 209.0 lb

## 2022-02-26 DIAGNOSIS — R300 Dysuria: Secondary | ICD-10-CM

## 2022-02-26 LAB — POCT URINALYSIS DIPSTICK
Bilirubin, UA: NEGATIVE
Blood, UA: NEGATIVE
Glucose, UA: NEGATIVE
Ketones, UA: NEGATIVE
Leukocytes, UA: NEGATIVE
Nitrite, UA: NEGATIVE
Protein, UA: NEGATIVE
Spec Grav, UA: 1.015 (ref 1.010–1.025)
Urobilinogen, UA: 0.2 E.U./dL
pH, UA: 6.5 (ref 5.0–8.0)

## 2022-02-27 ENCOUNTER — Ambulatory Visit: Payer: Medicaid Other | Admitting: Family Medicine

## 2022-02-28 ENCOUNTER — Other Ambulatory Visit: Payer: Self-pay | Admitting: Cardiovascular Disease

## 2022-02-28 ENCOUNTER — Encounter: Payer: Self-pay | Admitting: Family Medicine

## 2022-02-28 LAB — CULTURE, URINE COMPREHENSIVE
MICRO NUMBER:: 13082960
SPECIMEN QUALITY:: ADEQUATE

## 2022-03-01 ENCOUNTER — Other Ambulatory Visit: Payer: Self-pay | Admitting: Family Medicine

## 2022-03-02 ENCOUNTER — Encounter: Payer: Self-pay | Admitting: Family Medicine

## 2022-03-02 ENCOUNTER — Other Ambulatory Visit: Payer: Self-pay | Admitting: Family

## 2022-03-02 ENCOUNTER — Other Ambulatory Visit: Payer: Self-pay | Admitting: Family Medicine

## 2022-03-02 DIAGNOSIS — N3000 Acute cystitis without hematuria: Secondary | ICD-10-CM

## 2022-03-02 MED ORDER — SULFAMETHOXAZOLE-TRIMETHOPRIM 400-80 MG PO TABS: 1.0000 | ORAL_TABLET | Freq: Two times a day (BID) | ORAL | 0 refills | Status: DC

## 2022-03-04 ENCOUNTER — Encounter (HOSPITAL_COMMUNITY): Payer: Self-pay | Admitting: Gastroenterology

## 2022-03-04 ENCOUNTER — Encounter (HOSPITAL_COMMUNITY)
Admission: RE | Disposition: A | Discharge status: Home / Self Care | Payer: Self-pay | Source: Ambulatory Visit | Attending: Gastroenterology

## 2022-03-04 ENCOUNTER — Ambulatory Visit (HOSPITAL_COMMUNITY)
Admission: RE | Admit: 2022-03-04 | Discharge: 2022-03-04 | Disposition: A | Discharge status: Home / Self Care | Payer: Medicaid Other | Source: Ambulatory Visit | Attending: Gastroenterology | Admitting: Gastroenterology

## 2022-03-04 DIAGNOSIS — R1310 Dysphagia, unspecified: Secondary | ICD-10-CM | POA: Diagnosis not present

## 2022-03-04 HISTORY — PX: ESOPHAGEAL MANOMETRY: SHX5429

## 2022-03-04 SURGERY — MANOMETRY, ESOPHAGUS
Anesthesia: Choice

## 2022-03-04 MED ORDER — LIDOCAINE VISCOUS HCL 2 % MT SOLN
OROMUCOSAL | Status: AC
  Filled 2022-03-04: qty 15

## 2022-03-04 SURGICAL SUPPLY — 2 items
FACESHIELD LNG OPTICON STERILE (SAFETY) IMPLANT
GLOVE BIO SURGEON STRL SZ8 (GLOVE) ×4 IMPLANT

## 2022-03-04 NOTE — Progress Notes (Signed)
Esophageal Manometry done per protocol. Patient tolerated well without distress or complication.  

## 2022-03-06 ENCOUNTER — Telehealth: Payer: Self-pay | Admitting: Gastroenterology

## 2022-03-06 ENCOUNTER — Encounter: Payer: Self-pay | Admitting: Neurosurgery

## 2022-03-06 ENCOUNTER — Encounter: Payer: Self-pay | Admitting: Gastroenterology

## 2022-03-06 NOTE — Telephone Encounter (Signed)
Spoke with the patient.  ?Advised his test report will be scanned into his chart next week. A copy has been faxed to Dr Marius Ditch. The impression is normal. ?

## 2022-03-06 NOTE — Telephone Encounter (Signed)
Patient called to go over his manometry results. ?

## 2022-03-07 IMAGING — MR MR CERVICAL SPINE W/O CM
5 series · 38 of 48 positions shown · non-contrast
Comparison: The cervical spine 02/16/2020 at [HOSPITAL]
[HOSPITAL].

CLINICAL DATA: Chronic neck pain which has increased over the last
year. Pain extends in the left upper extremity with numbness and
tingling into the left hand.

EXAM:
MRI CERVICAL SPINE WITHOUT CONTRAST
TECHNIQUE: Multiplanar, multisequence MR imaging of the cervical spine was
performed. No intravenous contrast was administered.

[Series 5: T2 · sagittal · 3.0mm · 0.62mm/px · 6 of 15 slices shown (1 of 2)]
[im 1/15]
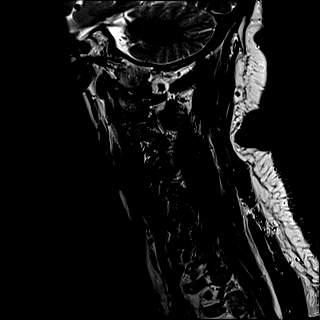
[im 3/15]
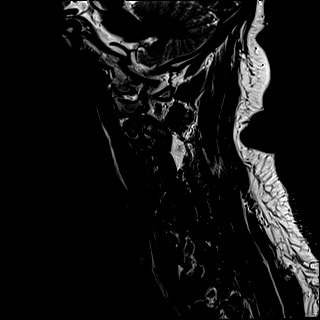
[im 6/15]
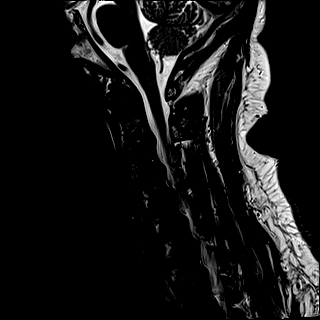
[im 9/15]
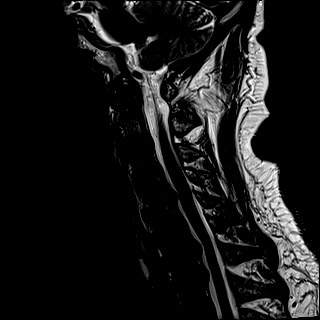
[im 12/15]
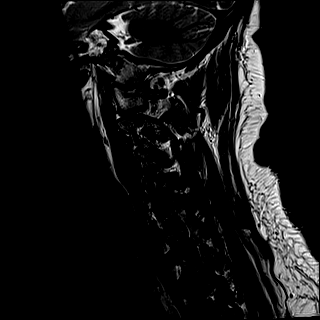
[im 15/15]
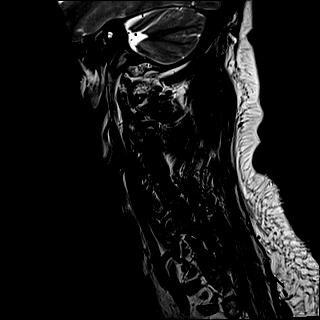

[Series 6: FLAIR · sagittal · 3.0mm · 0.78mm/px · 7 of 15 slices shown]
[im 1/15]
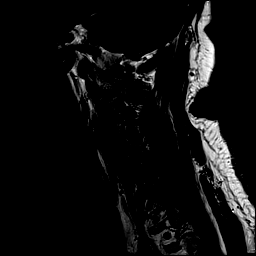
[im 3/15]
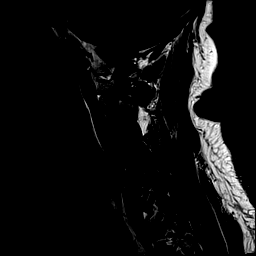
[im 5/15]
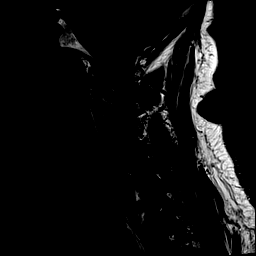
[im 8/15]
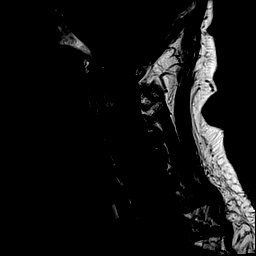
[im 10/15]
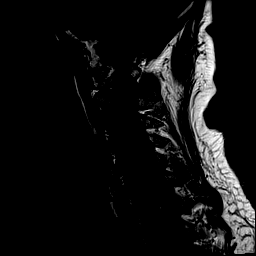
[im 12/15]
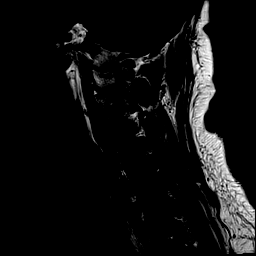
[im 15/15]
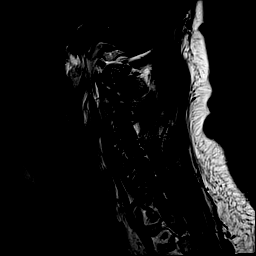

[Series 7: STIR · sagittal · 3.0mm · 0.62mm/px · 7 of 15 slices shown]
[im 1/15]
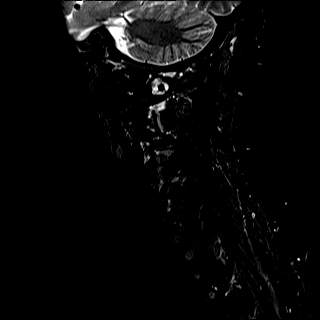
[im 3/15]
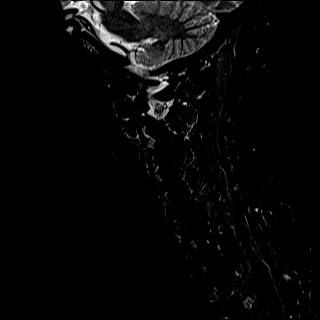
[im 5/15]
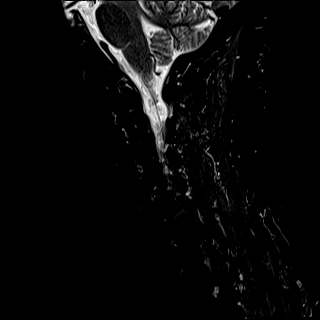
[im 8/15]
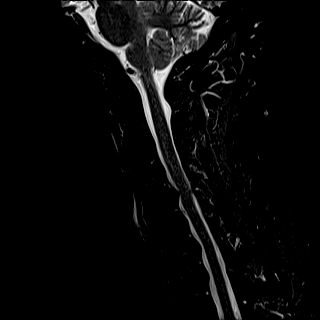
[im 10/15]
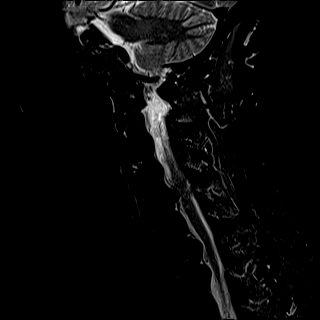
[im 12/15]
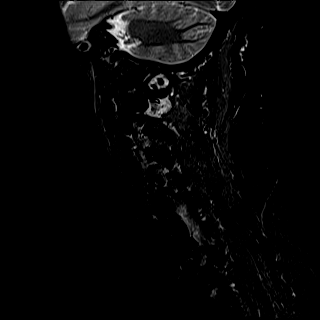
[im 15/15]
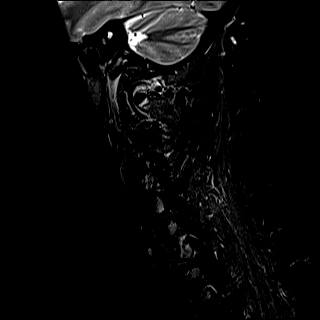

[Series 8: T2 · axial · 3.0mm · 0.70mm/px · z∈[-120,-27]mm · 10 of 29 slices shown (2 of 2)]
[im 1/29]
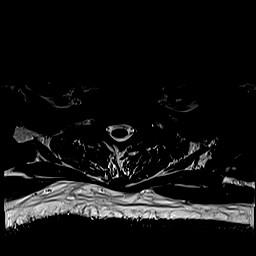
[im 3/29]
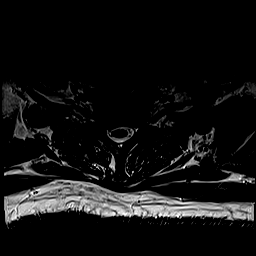
[im 5/29]
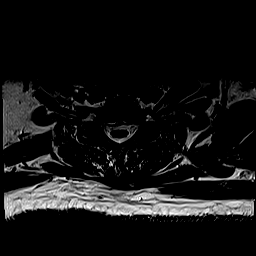
[im 7/29]
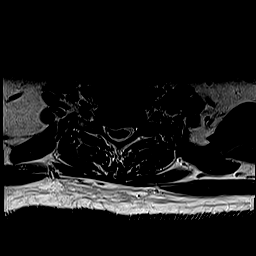
[im 9/29]
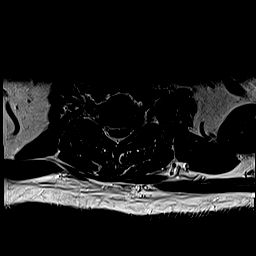
[im 13/29]
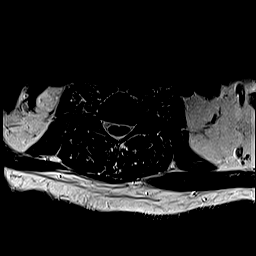
[im 16/29]
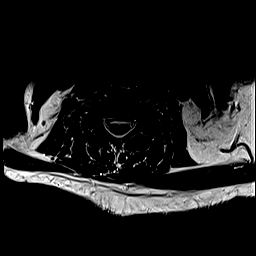
[im 20/29]
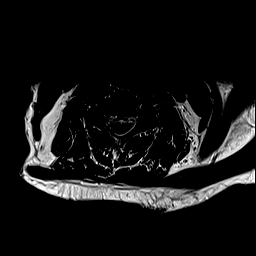
[im 24/29]
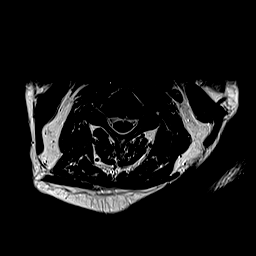
[im 29/29]
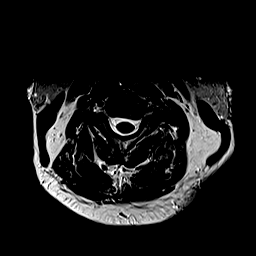

[Series 9: ax mpgr · axial · 3.0mm · 0.35mm/px · z∈[-120,-27]mm · 8 of 29 slices shown]
[im 1/29]
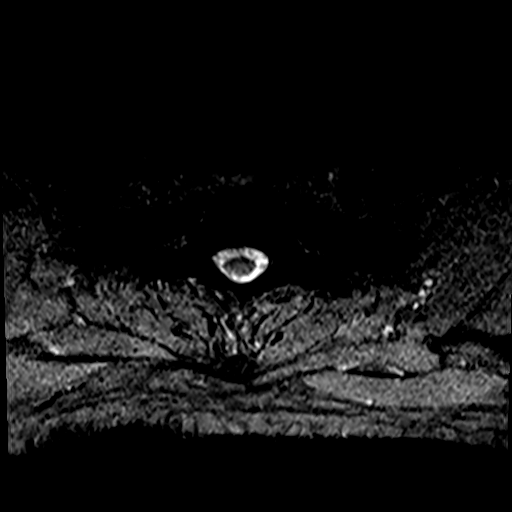
[im 5/29]
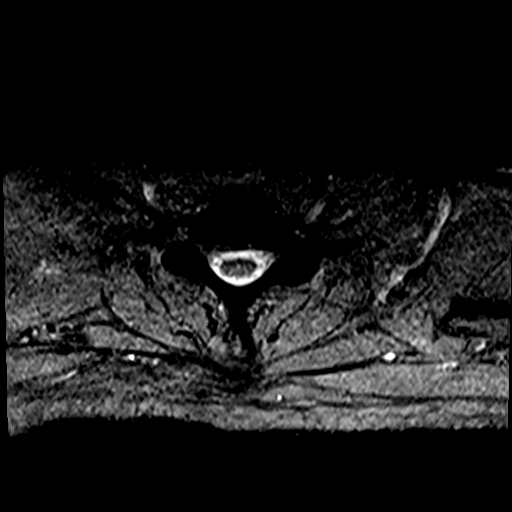
[im 9/29]
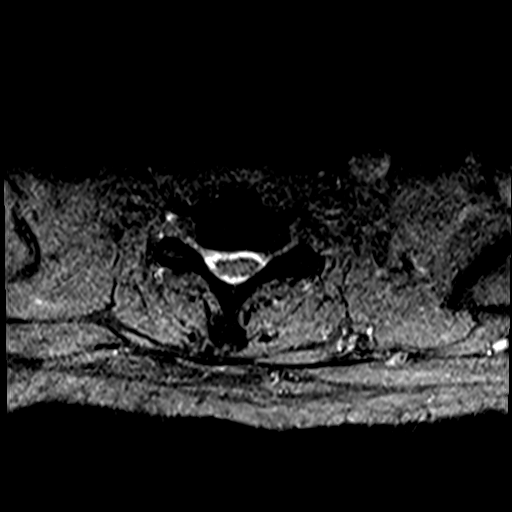
[im 13/29]
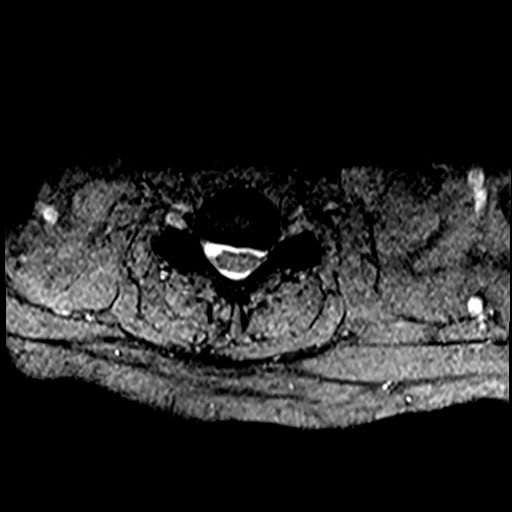
[im 16/29]
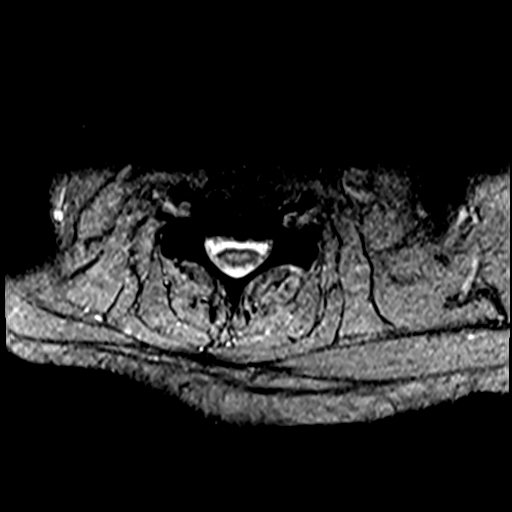
[im 20/29]
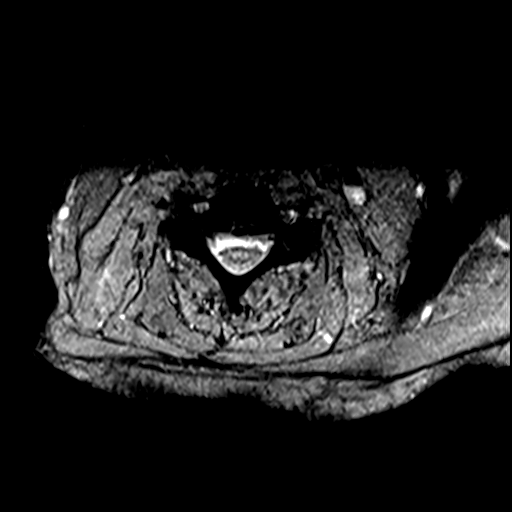
[im 24/29]
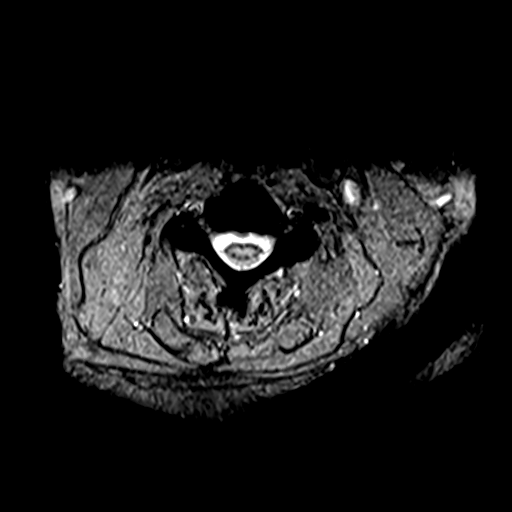
[im 29/29]
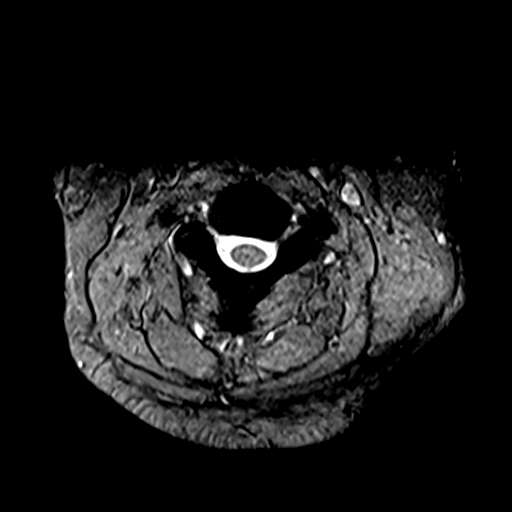

[38 of 48 positions shown; findings below may reference images not displayed]

FINDINGS: Alignment: No significant listhesis is present. Straightening of the
normal cervical lordosis is noted.

Vertebrae: Mild endplate marrow changes are present C3-4 and C4-5.
Mild edematous endplate changes noted anteriorly at C7-T1. Endplate
changes have progressed at C3-4 and C4-5.

Cord: Normal signal and morphology.

Posterior Fossa, vertebral arteries, paraspinal tissues:
Craniocervical junction is normal. Flow is present in the vertebral
arteries bilaterally. Visualized intracranial contents are normal.

Disc levels:

C2-3: Vertebral spurring send disease leads to moderate foraminal
narrowing, right greater than left. No central canal is patent.

C3-4: Spurring is worse on right. Moderate right mild left foraminal
narrowing is present.

C4-5: Broad-based disc osteophyte effaces ventral CSF fused right
foraminal narrowing due to facet disease

C5-6: Uncovertebral spurring leads to moderate foraminal narrowing
bilaterally greater right.

C6-7: Uncovertebral spurring leads to moderate foraminal narrowing
bilaterally greater than left. Mild central canal narrowing is
present

C7-T1: Vertebral spurring is moderate foraminal narrowing. Central
canal is patent.
IMPRESSION: 1. Multilevel spondylosis of the cervical spine as described.
2. Moderate foraminal narrowing bilaterally at C2-3, right greater
than left.
3. Moderate right mild left foraminal narrowing at C3-4.
4. Moderate foraminal narrowing bilaterally at C5-6 and C6-7 is
worse on the right.
5. Mild central canal narrowing at C6-7.

## 2022-03-09 ENCOUNTER — Encounter: Payer: Self-pay | Admitting: Gastroenterology

## 2022-03-09 ENCOUNTER — Other Ambulatory Visit: Payer: Self-pay | Admitting: Gastroenterology

## 2022-03-10 ENCOUNTER — Other Ambulatory Visit: Payer: Self-pay

## 2022-03-10 ENCOUNTER — Telehealth: Payer: Self-pay | Admitting: Family Medicine

## 2022-03-10 ENCOUNTER — Telehealth: Payer: Self-pay

## 2022-03-10 DIAGNOSIS — R1310 Dysphagia, unspecified: Secondary | ICD-10-CM

## 2022-03-10 DIAGNOSIS — R634 Abnormal weight loss: Secondary | ICD-10-CM

## 2022-03-10 DIAGNOSIS — K219 Gastro-esophageal reflux disease without esophagitis: Secondary | ICD-10-CM

## 2022-03-10 NOTE — Telephone Encounter (Signed)
Home Health Verbal Orders - Caller/Agency: Ninfa Meeker ?Callback Number: [phone] ?Requesting OT/PT/Skilled Nursing/Social Work/Speech Therapy: Speech Therapy  ?Frequency:  ? ?1w6 for dysphagia  ? ?

## 2022-03-10 NOTE — Telephone Encounter (Signed)
Schedule patient for modified barium swallow. Called special procedures and left a message for call back  ?

## 2022-03-10 NOTE — Progress Notes (Signed)
Placed referral per dr. Marius Ditch  ?

## 2022-03-10 NOTE — Telephone Encounter (Signed)
Given per Dr.Sowles. 

## 2022-03-10 NOTE — Telephone Encounter (Signed)
Order the Modified Barium swallow but do not know the answer to all the questions so pended the order  ?

## 2022-03-11 NOTE — Telephone Encounter (Signed)
Schedule Monday [facility] Regular breakfast and light lunch. Scan will be at 1:00pm and he will be done by 1:30pm. Informed patient and he verbalized understanding  ?

## 2022-03-12 ENCOUNTER — Other Ambulatory Visit: Payer: Self-pay | Admitting: Family Medicine

## 2022-03-16 ENCOUNTER — Ambulatory Visit
Admission: RE | Admit: 2022-03-16 | Discharge: 2022-03-16 | Disposition: A | Discharge status: Home / Self Care | Payer: Medicaid Other | Source: Ambulatory Visit | Attending: Gastroenterology | Admitting: Gastroenterology

## 2022-03-16 ENCOUNTER — Other Ambulatory Visit: Payer: Self-pay

## 2022-03-16 DIAGNOSIS — R1310 Dysphagia, unspecified: Secondary | ICD-10-CM | POA: Insufficient documentation

## 2022-03-16 DIAGNOSIS — R634 Abnormal weight loss: Secondary | ICD-10-CM | POA: Diagnosis not present

## 2022-03-16 DIAGNOSIS — R1312 Dysphagia, oropharyngeal phase: Secondary | ICD-10-CM | POA: Insufficient documentation

## 2022-03-16 NOTE — Therapy (Signed)
[place] ?[facility] ?[address] ?[place], [place], [address] ?Phone: [phone]   Fax:    ? ?Modified Barium Swallow ? ?Patient Details  ?Name: [name] [name] ?MRN: [id number] ?Date of Birth: 01/20/1961 ?No data recorded ? ?Encounter Date: 03/16/2022 ? ? End of Session - 03/16/22 1549   ? ? Visit Number 1   ? Number of Visits 1   ? Date for SLP Re-Evaluation 03/16/22   ? SLP Start Time 1300   ? SLP Stop Time  1330   ? SLP Time Calculation (min) 30 min   ? Activity Tolerance Patient tolerated treatment well   ? ?  ?  ? ?  ? ? ?Past Medical History:  ?Diagnosis Date  ? Allergy   ? Angina, class III ([place])   ? Chronic constipation   ? Degeneration of intervertebral disc of cervical region   ? Diabetes mellitus without complication ([place])   ? Diastolic dysfunction   ? a. 06/2017 Echo: EF 60-65%, no rwma, Gr1 DD.  ? Dyspnea   ? GERD (gastroesophageal reflux disease)   ? Hernia 1991  ? 02/10/2012-RIH repair  ? Hyperlipidemia   ? Hypertension 2008  ? Nerve root pain   ? Neuropathy   ? Non-obstructive CAD (coronary artery disease)   ? a. 05/30/2015 cath: LM nl, mLAD 50% (FFR 0.83), LCx minor irregs, RCA minor irregs, EF 55-65%-->Med Rx; b. 03/2016 Cath: LM nl LAD 10m D1/2/3 min irregs, LCX min irregs, OM1/2/3 nl, RCA min irregs, RPDA/RPAV/RPL1/RPL2 nl-->Med Rx; b. 07/2020 Cath: LM nl, LAD 320mD1/2/3 min irregs, LCX min irregs, RCA min irregs, RPDA/RPAV/RPL1,2 nl, EF 55-65%.  ? Obesity, unspecified 2012  ? Personal history of tobacco use, presenting hazards to health 2012  ? Polycythemia   ? Rectal bleeding 02/11/2017  ? [facility]urrent Right Inguinal Hernia Repair 02/09/2011, 04/07/2013  ? [facility]? Shortness of breath   ? Sleep apnea   ? a. On CPAP;  b. 06/2017 Echo: no PAH.  ? Umbilical hernia without mention of obstruction or gangrene 02/09/2011  [facility]? Vitamin D deficiency   ? ? ?Past Surgical History:  ?Procedure Laterality Date  ? BACK SURGERY  12/2008  ?  X2-LUMBAR  ? CARDIAC CATHETERIZATION N/A 05/30/2015  ? Procedure: Left Heart Cath;  Surgeon: MuWellington HampshireMD;  [facility];  Service: Cardiovascular;  Laterality: N/A;  ? CARDIAC CATHETERIZATION N/A 04/16/2016  ? Procedure: Left Heart Cath and Coronary Angiography;  Surgeon: MuWellington HampshireMD;  [place] [facility];  Service: Cardiovascular;  Laterality: N/A;  ? COLONOSCOPY  2014  ? Dr. SaJamal Collin? COLONOSCOPY WITH PROPOFOL N/A 12/25/2016  ? Procedure: COLONOSCOPY WITH PROPOFOL;  Surgeon: KiJonathon BellowsMD;  [facility];  Service: Endoscopy;  Laterality: N/A;  ? COLONOSCOPY WITH PROPOFOL N/A 01/29/2017  ? Procedure: COLONOSCOPY WITH PROPOFOL;  Surgeon: KiJonathon BellowsMD;  [facility];  Service: Endoscopy;  Laterality: N/A;  ? COLONOSCOPY WITH PROPOFOL N/A 04/11/2020  ? Procedure: COLONOSCOPY WITH PROPOFOL;  Surgeon: AnJonathon BellowsMD;  [facility];  Service: Gastroenterology;  Laterality: N/A;  ? ESOPHAGEAL MANOMETRY N/A 03/04/2022  ? Procedure: ESOPHAGEAL MANOMETRY (EM);  Surgeon: NaMauri PoleMD;  [facility];  Service: Gastroenterology;  Laterality: N/A;  ? ESOPHAGOGASTRODUODENOSCOPY (EGD) WITH PROPOFOL N/A 01/26/2019  ? Procedure: ESOPHAGOGASTRODUODENOSCOPY (EGD) WITH PROPOFOL;  Surgeon: VaLin LandsmanMD;  [place]:  Gastroenterology;  Laterality: N/A;  ? ESOPHAGOGASTRODUODENOSCOPY (EGD) WITH PROPOFOL N/A 09/10/2021  ? Procedure: ESOPHAGOGASTRODUODENOSCOPY (EGD) WITH PROPOFOL;  Surgeon: Lin Landsman, MD;  Location: [facility] ENDOSCOPY;  Service: Gastroenterology;  Laterality: N/A;  ? EVALUATION UNDER ANESTHESIA WITH HEMORRHOIDECTOMY N/A 02/24/2017  ? Procedure: EXAM UNDER ANESTHESIA WITH POSSIBLE EXCISION OF INTERNAL HEMORRHOIDS;  Surgeon: Olean Ree, MD;  [facility];  Service: General;  Laterality: N/A;  ? FISSURECTOMY  02/24/2017  ? Procedure: FISSURECTOMY;  Surgeon: Olean Ree, MD;  Location:  [facility] OR[facility];  Service: General;;  ? HAND SURGERY Left 2020  ? HERNIA REPAIR Right 1991  ? INGUINAL HERNIA REPAIR Right 2012  ? Dr [name]  ? INGUINAL HERNIA REPAIR Right 2014  ? Dr [name]  ? INTRAVASCULAR PRESSURE WIRE/FFR STUDY N/A 07/30/2021  ? Procedure: INTRAVASCULAR PRESSURE WIRE/FFR STUDY;  Surgeon: Wellington Hampshire, MD;  Location: [place] CV LAB;  Service: Cardiovascular;  Laterality: N/A;  ? LEFT HEART CATH AND CORONARY ANGIOGRAPHY Left 08/05/2020  ? Procedure: LEFT HEART CATH AND CORONARY ANGIOGRAPHY poss PCI;  Surgeon: Wellington Hampshire, MD;  Location: [place] CV LAB;  Service: Cardiovascular;  Laterality: Left;  ? MUSCLE BIOPSY Left 12/31/2021  ? Procedure: LEFT VASTUS LATERALIS BIOPSY;  Surgeon: Meade Maw, MD;  Location: [facility];  Service: Neurosurgery;  Laterality: Left;  ? RIGHT/LEFT HEART CATH AND CORONARY ANGIOGRAPHY N/A 07/30/2021  ? Procedure: RIGHT/LEFT HEART CATH AND CORONARY ANGIOGRAPHY;  Surgeon: Wellington Hampshire, MD;  Location: [place] CV LAB;  Service: Cardiovascular;  Laterality: N/A;  ? ? ?There were no vitals filed for this visit. ? ? Subjective Assessment - 03/16/22 1351   ? ? Subjective Pt reports he eats soft foods, avoids meats and breads   ? Currently in Pain? No/denies   ? ?  ?  ? ?  ? ? ? ? 03/16/22 1300  ?SLP Visit Information  ?SLP Received On 03/16/22  ?Subjective  ?Patient/Family Stated Goal figure out why he is losing weight  ?Pain Assessment  ?Pain Assessment No/denies pain  ?General Information  ?Date of Onset 03/10/22 ?(referral date)  ?HPI Patient is a 61 year old male referred by Dr. Marius Ditch for MBS due to difficulty swallowing. 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 Recently had normal esophageal manometry on 03/04/22, normal  gastric emptying study on 11/28/21, and EGD with balloon dilation (09/10/21). MRI brain on 09/12/21 showed mild cerebral and cerebellar atrophy, small chronic lacunar infarcts within bilateral cerebellar hemispheres, generalized parenchymal atrophy.  ?Type of Study MBS-Modified Barium Swallow Study  ?Previous Swallow Assessment see HPI  ?Diet Prior to this Study Regular;Dysphagia 3 (soft);Thin liquids  ?Temperature Spikes Noted No  ?Respiratory Status Room air  ?History of Recent Intubation No  ?Behavior/Cognition Alert;Pleasant mood;Cooperative  ?Oral Cavity Assessment WFL  ?Oral Care Completed by SLP No  ?Oral Cavity - Dentition Adequate natural dentition  ?Vision Functional for self feeding  ?Self-Feeding Abilities Able to feed self  ?Patient Positioning Upright in chair  ?Baseline Vocal Quality Hoarse;Low vocal intensity  ?Volitional Cough Strong  ?Volitional Swallow Able to elicit  ?Anatomy WFL  ?Oral Motor/Sensory Function  ?Overall Oral Motor/Sensory Function Other (comment) ?(slightly tremulous with exertion)  ?Oral Preparation/Oral Phase  ?Oral Phase Impaired  ?Oral - Pudding  ?Oral - Pudding Teaspoon Reduced posterior propulsion;Premature spillage;Decreased bolus cohesion;Piecemeal swallowing;Lingual/palatal residue  ?  Oral - Honey  ?Oral - Honey Teaspoon Lingual/palatal residue;Decreased bolus cohesion  ?Oral - Nectar  ?Oral - Nectar Teaspoon Decreased bolus cohesion;Lingual/palatal residue  ?Oral - Nectar Cup Decreased bolus cohesion;Premature spillage;Lingual/palatal residue  ?Oral - Thin  ?Oral - Thin Teaspoon Decreased bolus cohesion;Lingual/palatal residue  ?Oral - Thin Cup Decreased bolus cohesion;Lingual/palatal residue  ?Oral - Solids  ?Oral - Mech Soft Piecemeal swallowing;Lingual/palatal residue;Reduced posterior propulsion;Decreased bolus cohesion  ?Pharyngeal Phase  ?Pharyngeal Phase Impaired  ?Pharyngeal - Pudding  ?Pharyngeal- Pudding Teaspoon Pharyngeal residue - valleculae;Pharyngeal residue -  cp segment;Reduced pharyngeal peristalsis  ?Pharyngeal Material does not enter airway  ?Pharyngeal - Honey  ?Pharyngeal- Honey Teaspoon Pharyngeal residue - valleculae;Reduced pharyngeal peristalsis  ?P

## 2022-03-16 NOTE — Addendum Note (Signed)
Encounter addended by: Aliene Altes, CCC-SLP on: 03/16/2022 3:56 PM ? Actions taken: Order list changed

## 2022-03-17 ENCOUNTER — Telehealth: Payer: Self-pay

## 2022-03-17 ENCOUNTER — Other Ambulatory Visit: Payer: Self-pay

## 2022-03-17 DIAGNOSIS — Z9181 History of falling: Secondary | ICD-10-CM | POA: Diagnosis not present

## 2022-03-17 DIAGNOSIS — G713 Mitochondrial myopathy, not elsewhere classified: Secondary | ICD-10-CM | POA: Diagnosis not present

## 2022-03-17 DIAGNOSIS — R569 Unspecified convulsions: Secondary | ICD-10-CM | POA: Diagnosis not present

## 2022-03-17 DIAGNOSIS — E538 Deficiency of other specified B group vitamins: Secondary | ICD-10-CM | POA: Diagnosis not present

## 2022-03-17 DIAGNOSIS — M542 Cervicalgia: Secondary | ICD-10-CM | POA: Diagnosis not present

## 2022-03-17 DIAGNOSIS — M4722 Other spondylosis with radiculopathy, cervical region: Secondary | ICD-10-CM | POA: Diagnosis not present

## 2022-03-17 DIAGNOSIS — E119 Type 2 diabetes mellitus without complications: Secondary | ICD-10-CM | POA: Diagnosis not present

## 2022-03-17 DIAGNOSIS — I10 Essential (primary) hypertension: Secondary | ICD-10-CM | POA: Diagnosis not present

## 2022-03-17 DIAGNOSIS — G3184 Mild cognitive impairment, so stated: Secondary | ICD-10-CM | POA: Diagnosis not present

## 2022-03-17 DIAGNOSIS — Z7985 Long-term (current) use of injectable non-insulin antidiabetic drugs: Secondary | ICD-10-CM | POA: Diagnosis not present

## 2022-03-17 DIAGNOSIS — E785 Hyperlipidemia, unspecified: Secondary | ICD-10-CM | POA: Diagnosis not present

## 2022-03-17 DIAGNOSIS — Z87891 Personal history of nicotine dependence: Secondary | ICD-10-CM | POA: Diagnosis not present

## 2022-03-17 DIAGNOSIS — D751 Secondary polycythemia: Secondary | ICD-10-CM | POA: Diagnosis not present

## 2022-03-17 DIAGNOSIS — K219 Gastro-esophageal reflux disease without esophagitis: Secondary | ICD-10-CM | POA: Diagnosis not present

## 2022-03-17 NOTE — Telephone Encounter (Signed)
-----   Message from Lin Landsman, MD sent at 03/17/2022  7:46 AM EDT ----- ?Thanks for your input.  [name] [name], I need to see him in the office to discuss about feeding tube.  Please make an appointment this week if he can come ? ?Thanks ?RV ?----- Message ----- ?From: Aliene Altes, CCC-SLP ?Sent: 03/16/2022   4:15 PM EDT ?To: Lin Landsman, MD ? ?Hi Dr. Marius Ditch, ? ?Please see MBS report from today's exam. Overall mild oropharyngeal dysphagia for which he is compensating fairly well with extra swallows. No aspiration on his exam. Unsure of whether pharyngeal strengthing exercises or respiratory muscle strengthenig may be beneficial; I will leave to his [facility] SLP to determine once he has follow-up with the neuromuscular specialist. I feel aspiration risk is mild at this time, and minimized by following precautions. Should he have increasing difficulty or pulmonary complications, may need reassessment. I also discussed feeding tube and that this could be an option to supplement nutrition if he is unable to maintain adequate nutrition by mouth. Thank you for your referral. ? ?Deneise Lever, MS, CCC-SLP ?Speech-Language Pathologist ?[phone] ? ?

## 2022-03-17 NOTE — Telephone Encounter (Signed)
Made appointment for 03/18/22 at 1:00pm  ?

## 2022-03-18 ENCOUNTER — Telehealth (INDEPENDENT_AMBULATORY_CARE_PROVIDER_SITE_OTHER): Payer: Medicaid Other | Admitting: Gastroenterology

## 2022-03-18 ENCOUNTER — Telehealth: Payer: Self-pay

## 2022-03-18 ENCOUNTER — Encounter: Payer: Self-pay | Admitting: Gastroenterology

## 2022-03-18 DIAGNOSIS — R1312 Dysphagia, oropharyngeal phase: Secondary | ICD-10-CM

## 2022-03-18 MED ORDER — SUCRALFATE 1 GM/10ML PO SUSP: 1.0000 g | Freq: Four times a day (QID) | ORAL | 1 refills | Status: DC

## 2022-03-18 NOTE — Telephone Encounter (Signed)
Patient called this morning and states he is sick and is wanting to know if we could change his visit to a mychart visit  ?

## 2022-03-18 NOTE — Progress Notes (Signed)
?  ?[name] Sear, MD ?[place]  ?[address]  ?[place], [place] [address]  ?Main: [phone]  ?Fax: [phone] ? ? ? ?Gastroenterology Consultation Video Visit ? ?Referring Provider:     Steele Sizer, MD ?Primary Care Physician:  Steele Sizer, MD ?Primary Gastroenterologist:  Dr. [name] ?Reason for Consultation:     Oropharyngeal dysphagia, weight loss ?      ? HPI:   ?[name] [name] is a 61 y.o. male referred by Dr. Steele Sizer, MD  for consultation & management of oropharyngeal dysphagia, weight loss ? ?Virtual Visit Video Note ? ?I connected with [name] [name] on 03/18/22 at  1:00 PM EDT by video and verified that I am speaking with the correct person using two identifiers. ?  ?I discussed the limitations, risks, security and privacy concerns of performing an evaluation and management service by video and the availability of in person appointments. I also discussed with the patient that there may be a patient responsible charge related to this service. The patient expressed understanding and agreed to proceed. ? ?Location of the Patient: Home ? ?Location of the provider: Office ? ?Persons participating in the visit: Patient and provider only ? ? ?History of Present Illness: ?[name] [name] is seen to discuss about results of speech pathology evaluation.  He has mild oropharyngeal dysphagia.  Patient reports that his weight has been stabilized between 105 and 107 pounds.  He is able to eat soft food, finely chopped food, having good bowel movements.  He continues to take Carafate as needed as well as omeprazole 40 mg twice daily.  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  He also had mild colonic neurogenic atrophy.  He has an appointment to see a [place] neurology on April 12.  Patient underwent endoscopic evaluation including EGD, colonoscopy, CT abdomen and pelvis which were all unremarkable.  He is undergoing  physical therapy currently.  He also has B12 deficiency which has been treated.  Repeat levels have not been done ?  ? ?NSAIDs: None ? ?Antiplts/Anticoagulants/Anti thrombotics: None ? ?GI Procedures:  ?EGD 01/26/2019 ?- Normal duodenal bulb and second portion of the duodenum. ?- Normal stomach. Biopsied. ?- Esophagogastric landmarks identified. ?- Normal gastroesophageal junction and esophagus. ?  ?DIAGNOSIS:  ?A.  STOMACH; COLD BIOPSY:  ?- ANTRAL MUCOSA WITH MILD NON-SPECIFIC CHRONIC AND FOCAL ACTIVE  ?GASTRITIS.  ?- UNREMARKABLE OXYNTIC MUCOSA.  ?- NEGATIVE FOR HELICOBACTER; IHC STAIN EXAMINED.  ?- NEGATIVE FOR INTESTINAL METAPLASIA, DYSPLASIA, AND MALIGNANCY.  ?  ?Colonoscopy in 2017 and 2018 by Dr. Vicente Males ?- Preparation of the colon was poor. ?- Stool in the sigmoid colon. ?- No specimens collected. ?  ?- Three 5 to 7 mm polyps in the transverse colon, removed with a cold snare. Resected and retrieved. ?- One 3 mm polyp in the ascending colon, removed with a cold biopsy forceps. Resected and retrieved. ?- Non-bleeding internal hemorrhoids. ?- The examination was otherwise normal on direct and retroflexion views. ?  ?DIAGNOSIS:  ?A. COLON POLYP, ASCENDING; COLD BIOPSY:  ?- TUBULAR ADENOMA.  ?- NEGATIVE FOR HIGH-GRADE DYSPLASIA AND MALIGNANCY.  ? ?B.  COLON POLYP ?2, TRANSVERSE; COLD SNARE:  ?- TUBULAR ADENOMAS (2), NEGATIVE FOR HIGH-GRADE DYSPLASIA AND  ?MALIGNANCY.  ?- POLYPOID COLONIC MUCOSA WITH HEMORRHAGE OF THE LAMINA PROPRIA (1).  ? ?Past Medical History:  ?Diagnosis Date  ? Allergy   ? Angina, class III ([place])   ? Chronic constipation   ?  Degeneration of intervertebral disc of cervical region   ? Diabetes mellitus without complication ([place])   ? Diastolic dysfunction   ? a. 06/2017 Echo: EF 60-65%, no rwma, Gr1 DD.  ? Dyspnea   ? GERD (gastroesophageal reflux disease)   ? Hernia 1991  ? 02/10/2012-RIH repair  ? Hyperlipidemia   ? Hypertension 2008  ? Nerve root pain   ? Neuropathy   ? Non-obstructive CAD  (coronary artery disease)   ? a. 05/30/2015 cath: LM nl, mLAD 50% (FFR 0.83), LCx minor irregs, RCA minor irregs, EF 55-65%-->Med Rx; b. 03/2016 Cath: LM nl LAD 109m D1/2/3 min irregs, LCX min irregs, OM1/2/3 nl, RCA min irregs, RPDA/RPAV/RPL1/RPL2 nl-->Med Rx; b. 07/2020 Cath: LM nl, LAD 311mD1/2/3 min irregs, LCX min irregs, RCA min irregs, RPDA/RPAV/RPL1,2 nl, EF 55-65%.  ? Obesity, unspecified 2012  ? Personal history of tobacco use, presenting hazards to health 2012  ? Polycythemia   ? Rectal bleeding 02/11/2017  ? [facility]urrent Right Inguinal Hernia Repair 02/09/2011, 04/07/2013  ? [facility]? Shortness of breath   ? Sleep apnea   ? a. On CPAP;  b. 06/2017 Echo: no PAH.  ? Umbilical hernia without mention of obstruction or gangrene 02/09/2011  ? [facility]? Vitamin D deficiency   ? ? ?Past Surgical History:  ?Procedure Laterality Date  ? BACK SURGERY  12/2008  ? X2-LUMBAR  ? CARDIAC CATHETERIZATION N/A 05/30/2015  ? Procedure: Left Heart Cath;  Surgeon: MuWellington HampshireMD;  [facility];  Service: Cardiovascular;  Laterality: N/A;  ? CARDIAC CATHETERIZATION N/A 04/16/2016  ? Procedure: Left Heart Cath and Coronary Angiography;  Surgeon: MuWellington HampshireMD;  [facility];  Service: Cardiovascular;  Laterality: N/A;  ? COLONOSCOPY  2014  ? Dr. SaJamal Collin? COLONOSCOPY WITH PROPOFOL N/A 12/25/2016  ? Procedure: COLONOSCOPY WITH PROPOFOL;  Surgeon: KiJonathon BellowsMD;  [facility];  Service: Endoscopy;  Laterality: N/A;  ? COLONOSCOPY WITH PROPOFOL N/A 01/29/2017  ? Procedure: COLONOSCOPY WITH PROPOFOL;  Surgeon: KiJonathon BellowsMD;  [facility];  Service: Endoscopy;  Laterality: N/A;  ? COLONOSCOPY WITH PROPOFOL N/A 04/11/2020  ? Procedure: COLONOSCOPY WITH PROPOFOL;  Surgeon: AnJonathon BellowsMD;  [facility];  Service: Gastroenterology;  Laterality: N/A;  ? ESOPHAGEAL MANOMETRY N/A 03/04/2022  ? Procedure: ESOPHAGEAL MANOMETRY (EM);  Surgeon: NaMauri PoleMD;  [facility];  Service: Gastroenterology;  Laterality: N/A;  ? ESOPHAGOGASTRODUODENOSCOPY (EGD) WITH PROPOFOL N/A 01/26/2019  ? Procedure: ESOPHAGOGASTRODUODENOSCOPY (EGD) WITH PROPOFOL;  Surgeon: VaLin LandsmanMD;  [facility];  Service: Gastroenterology;  Laterality: N/A;  ? ESOPHAGOGASTRODUODENOSCOPY (EGD) WITH PROPOFOL N/A 09/10/2021  ? Procedure: ESOPHAGOGASTRODUODENOSCOPY (EGD) WITH PROPOFOL;  Surgeon: VaLin LandsmanMD;  [facility];  Service: Gastroenterology;  Laterality: N/A;  ? EVALUATION UNDER ANESTHESIA WITH HEMORRHOIDECTOMY N/A 02/24/2017  ? Procedure: EXAM UNDER ANESTHESIA WITH POSSIBLE EXCISION OF INTERNAL HEMORRHOIDS;  Surgeon: JoOlean ReeMD;  [facility];  Service: General;  Laterality: N/A;  ? FISSURECTOMY  02/24/2017  ? Procedure: FISSURECTOMY;  Surgeon: JoOlean ReeMD;  Location: [facility];  Service: General;;  ? HAND SURGERY Left 2020  ? HERNIA REPAIR Right 1991  ? INGUINAL HERNIA REPAIR Right 2012  ? Dr SaJamal Collin? INGUINAL HERNIA REPAIR Right 2014  ? Dr SaJamal Collin? INTRAVASCULAR PRESSURE WIRE/FFR STUDY N/A 07/30/2021  ? Procedure: INTRAVASCULAR PRESSURE WIRE/FFR STUDY;  Surgeon: ArWellington Hampshire  MD;  Location: [place] CV LAB;  Service: Cardiovascular;  Laterality: N/A;  ? LEFT HEART CATH AND CORONARY ANGIOGRAPHY Left 08/05/2020  ? Procedure: LEFT HEART CATH AND CORONARY ANGIOGRAPHY poss PCI;  Surgeon: Wellington Hampshire, MD;  Location: [place] CV LAB;  Service: Cardiovascular;  Laterality: Left;  ? MUSCLE BIOPSY Left 12/31/2021  ? Procedure: LEFT VASTUS LATERALIS BIOPSY;  Surgeon: Meade Maw, MD;  Location: [facility];  Service: Neurosurgery;  Laterality: Left;  ? RIGHT/LEFT HEART CATH AND CORONARY ANGIOGRAPHY N/A 07/30/2021  ? Procedure: RIGHT/LEFT HEART CATH AND CORONARY ANGIOGRAPHY;  Surgeon: Wellington Hampshire, MD;  Location: [place] CV LAB;  Service: Cardiovascular;  Laterality: N/A;  ? ? ?Current Outpatient  Medications:  ?  ACCU-CHEK GUIDE test strip, USE TO TEST BLOOD SUGAR THREE TIMES DAILY AS NEEDED, Disp: 100 strip, Rfl: 1 ?  Accu-Chek Softclix Lancets lancets, TEST TWICE DAILY, Disp: 100 each, Rfl: 3 ?  blood

## 2022-03-18 NOTE — Telephone Encounter (Signed)
Sure  - RV ?

## 2022-03-18 NOTE — Telephone Encounter (Signed)
Change patient to a mychart visit  ?

## 2022-03-19 ENCOUNTER — Encounter: Payer: Self-pay | Admitting: Neurosurgery

## 2022-03-19 ENCOUNTER — Ambulatory Visit: Payer: Medicaid Other | Admitting: Physician Assistant

## 2022-03-19 DIAGNOSIS — R569 Unspecified convulsions: Secondary | ICD-10-CM | POA: Diagnosis not present

## 2022-03-19 DIAGNOSIS — I10 Essential (primary) hypertension: Secondary | ICD-10-CM | POA: Diagnosis not present

## 2022-03-19 DIAGNOSIS — Z87891 Personal history of nicotine dependence: Secondary | ICD-10-CM | POA: Diagnosis not present

## 2022-03-19 DIAGNOSIS — E538 Deficiency of other specified B group vitamins: Secondary | ICD-10-CM | POA: Diagnosis not present

## 2022-03-19 DIAGNOSIS — M4722 Other spondylosis with radiculopathy, cervical region: Secondary | ICD-10-CM | POA: Diagnosis not present

## 2022-03-19 DIAGNOSIS — K219 Gastro-esophageal reflux disease without esophagitis: Secondary | ICD-10-CM | POA: Diagnosis not present

## 2022-03-19 DIAGNOSIS — G3184 Mild cognitive impairment, so stated: Secondary | ICD-10-CM | POA: Diagnosis not present

## 2022-03-19 DIAGNOSIS — G713 Mitochondrial myopathy, not elsewhere classified: Secondary | ICD-10-CM | POA: Diagnosis not present

## 2022-03-19 DIAGNOSIS — M542 Cervicalgia: Secondary | ICD-10-CM | POA: Diagnosis not present

## 2022-03-19 DIAGNOSIS — D751 Secondary polycythemia: Secondary | ICD-10-CM | POA: Diagnosis not present

## 2022-03-19 DIAGNOSIS — E119 Type 2 diabetes mellitus without complications: Secondary | ICD-10-CM | POA: Diagnosis not present

## 2022-03-19 DIAGNOSIS — Z9181 History of falling: Secondary | ICD-10-CM | POA: Diagnosis not present

## 2022-03-19 DIAGNOSIS — Z7985 Long-term (current) use of injectable non-insulin antidiabetic drugs: Secondary | ICD-10-CM | POA: Diagnosis not present

## 2022-03-19 DIAGNOSIS — E785 Hyperlipidemia, unspecified: Secondary | ICD-10-CM | POA: Diagnosis not present

## 2022-03-19 LAB — SURGICAL PATHOLOGY

## 2022-03-26 ENCOUNTER — Other Ambulatory Visit (HOSPITAL_COMMUNITY): Payer: Self-pay

## 2022-03-26 DIAGNOSIS — Z7985 Long-term (current) use of injectable non-insulin antidiabetic drugs: Secondary | ICD-10-CM | POA: Diagnosis not present

## 2022-03-26 DIAGNOSIS — Z9181 History of falling: Secondary | ICD-10-CM | POA: Diagnosis not present

## 2022-03-26 DIAGNOSIS — G713 Mitochondrial myopathy, not elsewhere classified: Secondary | ICD-10-CM | POA: Diagnosis not present

## 2022-03-26 DIAGNOSIS — M4722 Other spondylosis with radiculopathy, cervical region: Secondary | ICD-10-CM | POA: Diagnosis not present

## 2022-03-26 DIAGNOSIS — K219 Gastro-esophageal reflux disease without esophagitis: Secondary | ICD-10-CM | POA: Diagnosis not present

## 2022-03-26 DIAGNOSIS — D751 Secondary polycythemia: Secondary | ICD-10-CM | POA: Diagnosis not present

## 2022-03-26 DIAGNOSIS — E785 Hyperlipidemia, unspecified: Secondary | ICD-10-CM | POA: Diagnosis not present

## 2022-03-26 DIAGNOSIS — I10 Essential (primary) hypertension: Secondary | ICD-10-CM | POA: Diagnosis not present

## 2022-03-26 DIAGNOSIS — Z87891 Personal history of nicotine dependence: Secondary | ICD-10-CM | POA: Diagnosis not present

## 2022-03-26 DIAGNOSIS — M542 Cervicalgia: Secondary | ICD-10-CM | POA: Diagnosis not present

## 2022-03-26 DIAGNOSIS — R569 Unspecified convulsions: Secondary | ICD-10-CM | POA: Diagnosis not present

## 2022-03-26 DIAGNOSIS — G3184 Mild cognitive impairment, so stated: Secondary | ICD-10-CM | POA: Diagnosis not present

## 2022-03-26 DIAGNOSIS — E119 Type 2 diabetes mellitus without complications: Secondary | ICD-10-CM | POA: Diagnosis not present

## 2022-03-26 DIAGNOSIS — E538 Deficiency of other specified B group vitamins: Secondary | ICD-10-CM | POA: Diagnosis not present

## 2022-03-27 ENCOUNTER — Telehealth: Payer: Self-pay | Admitting: Family Medicine

## 2022-03-27 DIAGNOSIS — M542 Cervicalgia: Secondary | ICD-10-CM | POA: Diagnosis not present

## 2022-03-27 DIAGNOSIS — M4722 Other spondylosis with radiculopathy, cervical region: Secondary | ICD-10-CM | POA: Diagnosis not present

## 2022-03-27 DIAGNOSIS — E119 Type 2 diabetes mellitus without complications: Secondary | ICD-10-CM | POA: Diagnosis not present

## 2022-03-27 DIAGNOSIS — E538 Deficiency of other specified B group vitamins: Secondary | ICD-10-CM | POA: Diagnosis not present

## 2022-03-27 DIAGNOSIS — G713 Mitochondrial myopathy, not elsewhere classified: Secondary | ICD-10-CM | POA: Diagnosis not present

## 2022-03-27 DIAGNOSIS — D751 Secondary polycythemia: Secondary | ICD-10-CM | POA: Diagnosis not present

## 2022-03-27 DIAGNOSIS — I10 Essential (primary) hypertension: Secondary | ICD-10-CM | POA: Diagnosis not present

## 2022-03-27 DIAGNOSIS — G3184 Mild cognitive impairment, so stated: Secondary | ICD-10-CM | POA: Diagnosis not present

## 2022-03-27 DIAGNOSIS — R569 Unspecified convulsions: Secondary | ICD-10-CM | POA: Diagnosis not present

## 2022-03-27 DIAGNOSIS — Z7985 Long-term (current) use of injectable non-insulin antidiabetic drugs: Secondary | ICD-10-CM | POA: Diagnosis not present

## 2022-03-27 DIAGNOSIS — Z9181 History of falling: Secondary | ICD-10-CM | POA: Diagnosis not present

## 2022-03-27 DIAGNOSIS — Z87891 Personal history of nicotine dependence: Secondary | ICD-10-CM | POA: Diagnosis not present

## 2022-03-27 DIAGNOSIS — E785 Hyperlipidemia, unspecified: Secondary | ICD-10-CM | POA: Diagnosis not present

## 2022-03-27 DIAGNOSIS — K219 Gastro-esophageal reflux disease without esophagitis: Secondary | ICD-10-CM | POA: Diagnosis not present

## 2022-03-27 NOTE — Telephone Encounter (Signed)
Verbal given per Dr. Ancil Boozer ?

## 2022-03-27 NOTE — Telephone Encounter (Signed)
Verbal orders given per Dr. Sowles 

## 2022-03-27 NOTE — Telephone Encounter (Signed)
[name] [name] calling from [facility] is calling to request verbal orders to continue speech Therapy ?Frequency-1 time a week for 6 weeks ?CB- [phone] ok to leave verbal on VM ?

## 2022-03-27 NOTE — Telephone Encounter (Signed)
Prudencio Burly is calling from [facility] is calling Continue Pt verbal orders. ?Frequency - 1 w 9 ?CB- [phone] ok to leave verbal on VM ?

## 2022-03-28 ENCOUNTER — Other Ambulatory Visit: Payer: Self-pay | Admitting: Cardiovascular Disease

## 2022-03-30 NOTE — Telephone Encounter (Signed)
Patient cancelled recent appt 3-23 and states will call back to reschedule.  ?

## 2022-03-30 NOTE — Telephone Encounter (Signed)
Please reschedule office visit for further refills. Patient canceled last appointment. Thank you! ?

## 2022-03-31 DIAGNOSIS — Z7985 Long-term (current) use of injectable non-insulin antidiabetic drugs: Secondary | ICD-10-CM | POA: Diagnosis not present

## 2022-03-31 DIAGNOSIS — E119 Type 2 diabetes mellitus without complications: Secondary | ICD-10-CM | POA: Diagnosis not present

## 2022-03-31 DIAGNOSIS — D751 Secondary polycythemia: Secondary | ICD-10-CM | POA: Diagnosis not present

## 2022-03-31 DIAGNOSIS — R569 Unspecified convulsions: Secondary | ICD-10-CM | POA: Diagnosis not present

## 2022-03-31 DIAGNOSIS — K219 Gastro-esophageal reflux disease without esophagitis: Secondary | ICD-10-CM | POA: Diagnosis not present

## 2022-03-31 DIAGNOSIS — G713 Mitochondrial myopathy, not elsewhere classified: Secondary | ICD-10-CM | POA: Diagnosis not present

## 2022-03-31 DIAGNOSIS — I10 Essential (primary) hypertension: Secondary | ICD-10-CM | POA: Diagnosis not present

## 2022-03-31 DIAGNOSIS — G3184 Mild cognitive impairment, so stated: Secondary | ICD-10-CM | POA: Diagnosis not present

## 2022-03-31 DIAGNOSIS — M542 Cervicalgia: Secondary | ICD-10-CM | POA: Diagnosis not present

## 2022-03-31 DIAGNOSIS — E538 Deficiency of other specified B group vitamins: Secondary | ICD-10-CM | POA: Diagnosis not present

## 2022-03-31 DIAGNOSIS — Z9181 History of falling: Secondary | ICD-10-CM | POA: Diagnosis not present

## 2022-03-31 DIAGNOSIS — E785 Hyperlipidemia, unspecified: Secondary | ICD-10-CM | POA: Diagnosis not present

## 2022-03-31 DIAGNOSIS — Z87891 Personal history of nicotine dependence: Secondary | ICD-10-CM | POA: Diagnosis not present

## 2022-03-31 DIAGNOSIS — M4722 Other spondylosis with radiculopathy, cervical region: Secondary | ICD-10-CM | POA: Diagnosis not present

## 2022-03-31 NOTE — Telephone Encounter (Signed)
Please review for refill. See below. LS 12/2021. Thank you! ?

## 2022-04-01 DIAGNOSIS — G4731 Primary central sleep apnea: Secondary | ICD-10-CM | POA: Diagnosis not present

## 2022-04-01 DIAGNOSIS — G4733 Obstructive sleep apnea (adult) (pediatric): Secondary | ICD-10-CM | POA: Diagnosis not present

## 2022-04-02 DIAGNOSIS — M542 Cervicalgia: Secondary | ICD-10-CM | POA: Diagnosis not present

## 2022-04-02 DIAGNOSIS — E119 Type 2 diabetes mellitus without complications: Secondary | ICD-10-CM | POA: Diagnosis not present

## 2022-04-02 DIAGNOSIS — E538 Deficiency of other specified B group vitamins: Secondary | ICD-10-CM | POA: Diagnosis not present

## 2022-04-02 DIAGNOSIS — Z87891 Personal history of nicotine dependence: Secondary | ICD-10-CM | POA: Diagnosis not present

## 2022-04-02 DIAGNOSIS — G3184 Mild cognitive impairment, so stated: Secondary | ICD-10-CM | POA: Diagnosis not present

## 2022-04-02 DIAGNOSIS — Z7985 Long-term (current) use of injectable non-insulin antidiabetic drugs: Secondary | ICD-10-CM | POA: Diagnosis not present

## 2022-04-02 DIAGNOSIS — Z9181 History of falling: Secondary | ICD-10-CM | POA: Diagnosis not present

## 2022-04-02 DIAGNOSIS — E785 Hyperlipidemia, unspecified: Secondary | ICD-10-CM | POA: Diagnosis not present

## 2022-04-02 DIAGNOSIS — M4722 Other spondylosis with radiculopathy, cervical region: Secondary | ICD-10-CM | POA: Diagnosis not present

## 2022-04-02 DIAGNOSIS — D751 Secondary polycythemia: Secondary | ICD-10-CM | POA: Diagnosis not present

## 2022-04-02 DIAGNOSIS — G713 Mitochondrial myopathy, not elsewhere classified: Secondary | ICD-10-CM | POA: Diagnosis not present

## 2022-04-02 DIAGNOSIS — R569 Unspecified convulsions: Secondary | ICD-10-CM | POA: Diagnosis not present

## 2022-04-02 DIAGNOSIS — I10 Essential (primary) hypertension: Secondary | ICD-10-CM | POA: Diagnosis not present

## 2022-04-02 DIAGNOSIS — K219 Gastro-esophageal reflux disease without esophagitis: Secondary | ICD-10-CM | POA: Diagnosis not present

## 2022-04-07 ENCOUNTER — Other Ambulatory Visit: Payer: Self-pay

## 2022-04-07 ENCOUNTER — Encounter: Payer: Self-pay | Admitting: Family Medicine

## 2022-04-07 MED ORDER — DAPAGLIFLOZIN PROPANEDIOL 10 MG PO TABS: 10.0000 mg | ORAL_TABLET | Freq: Every day | ORAL | 1 refills | Status: DC

## 2022-04-07 NOTE — Telephone Encounter (Signed)
Attemped to schedule cancelled [place] appt.  No ans no vm .  ?

## 2022-04-08 DIAGNOSIS — G629 Polyneuropathy, unspecified: Secondary | ICD-10-CM | POA: Diagnosis not present

## 2022-04-08 DIAGNOSIS — G713 Mitochondrial myopathy, not elsewhere classified: Secondary | ICD-10-CM | POA: Diagnosis not present

## 2022-04-08 DIAGNOSIS — R531 Weakness: Secondary | ICD-10-CM | POA: Diagnosis not present

## 2022-04-14 DIAGNOSIS — G713 Mitochondrial myopathy, not elsewhere classified: Secondary | ICD-10-CM | POA: Diagnosis not present

## 2022-04-15 DIAGNOSIS — E785 Hyperlipidemia, unspecified: Secondary | ICD-10-CM | POA: Diagnosis not present

## 2022-04-15 DIAGNOSIS — Z7985 Long-term (current) use of injectable non-insulin antidiabetic drugs: Secondary | ICD-10-CM | POA: Diagnosis not present

## 2022-04-15 DIAGNOSIS — I10 Essential (primary) hypertension: Secondary | ICD-10-CM | POA: Diagnosis not present

## 2022-04-15 DIAGNOSIS — K219 Gastro-esophageal reflux disease without esophagitis: Secondary | ICD-10-CM | POA: Diagnosis not present

## 2022-04-15 DIAGNOSIS — E119 Type 2 diabetes mellitus without complications: Secondary | ICD-10-CM | POA: Diagnosis not present

## 2022-04-15 DIAGNOSIS — G3184 Mild cognitive impairment, so stated: Secondary | ICD-10-CM | POA: Diagnosis not present

## 2022-04-15 DIAGNOSIS — D751 Secondary polycythemia: Secondary | ICD-10-CM | POA: Diagnosis not present

## 2022-04-15 DIAGNOSIS — Z9181 History of falling: Secondary | ICD-10-CM | POA: Diagnosis not present

## 2022-04-15 DIAGNOSIS — M542 Cervicalgia: Secondary | ICD-10-CM | POA: Diagnosis not present

## 2022-04-15 DIAGNOSIS — M4722 Other spondylosis with radiculopathy, cervical region: Secondary | ICD-10-CM | POA: Diagnosis not present

## 2022-04-15 DIAGNOSIS — R569 Unspecified convulsions: Secondary | ICD-10-CM | POA: Diagnosis not present

## 2022-04-15 DIAGNOSIS — G713 Mitochondrial myopathy, not elsewhere classified: Secondary | ICD-10-CM | POA: Diagnosis not present

## 2022-04-15 DIAGNOSIS — E538 Deficiency of other specified B group vitamins: Secondary | ICD-10-CM | POA: Diagnosis not present

## 2022-04-15 DIAGNOSIS — Z87891 Personal history of nicotine dependence: Secondary | ICD-10-CM | POA: Diagnosis not present

## 2022-04-21 DIAGNOSIS — G3184 Mild cognitive impairment, so stated: Secondary | ICD-10-CM | POA: Diagnosis not present

## 2022-04-21 DIAGNOSIS — E119 Type 2 diabetes mellitus without complications: Secondary | ICD-10-CM | POA: Diagnosis not present

## 2022-04-21 DIAGNOSIS — I10 Essential (primary) hypertension: Secondary | ICD-10-CM | POA: Diagnosis not present

## 2022-04-21 DIAGNOSIS — E785 Hyperlipidemia, unspecified: Secondary | ICD-10-CM | POA: Diagnosis not present

## 2022-04-21 DIAGNOSIS — R569 Unspecified convulsions: Secondary | ICD-10-CM | POA: Diagnosis not present

## 2022-04-21 DIAGNOSIS — Z87891 Personal history of nicotine dependence: Secondary | ICD-10-CM | POA: Diagnosis not present

## 2022-04-21 DIAGNOSIS — M542 Cervicalgia: Secondary | ICD-10-CM | POA: Diagnosis not present

## 2022-04-21 DIAGNOSIS — E538 Deficiency of other specified B group vitamins: Secondary | ICD-10-CM | POA: Diagnosis not present

## 2022-04-21 DIAGNOSIS — G713 Mitochondrial myopathy, not elsewhere classified: Secondary | ICD-10-CM | POA: Diagnosis not present

## 2022-04-21 DIAGNOSIS — K219 Gastro-esophageal reflux disease without esophagitis: Secondary | ICD-10-CM | POA: Diagnosis not present

## 2022-04-21 DIAGNOSIS — M4722 Other spondylosis with radiculopathy, cervical region: Secondary | ICD-10-CM | POA: Diagnosis not present

## 2022-04-21 DIAGNOSIS — D751 Secondary polycythemia: Secondary | ICD-10-CM | POA: Diagnosis not present

## 2022-04-21 DIAGNOSIS — Z9181 History of falling: Secondary | ICD-10-CM | POA: Diagnosis not present

## 2022-04-21 DIAGNOSIS — Z7985 Long-term (current) use of injectable non-insulin antidiabetic drugs: Secondary | ICD-10-CM | POA: Diagnosis not present

## 2022-04-23 DIAGNOSIS — Z7985 Long-term (current) use of injectable non-insulin antidiabetic drugs: Secondary | ICD-10-CM | POA: Diagnosis not present

## 2022-04-23 DIAGNOSIS — K219 Gastro-esophageal reflux disease without esophagitis: Secondary | ICD-10-CM | POA: Diagnosis not present

## 2022-04-23 DIAGNOSIS — M542 Cervicalgia: Secondary | ICD-10-CM | POA: Diagnosis not present

## 2022-04-23 DIAGNOSIS — Z87891 Personal history of nicotine dependence: Secondary | ICD-10-CM | POA: Diagnosis not present

## 2022-04-23 DIAGNOSIS — I10 Essential (primary) hypertension: Secondary | ICD-10-CM | POA: Diagnosis not present

## 2022-04-23 DIAGNOSIS — Z9181 History of falling: Secondary | ICD-10-CM | POA: Diagnosis not present

## 2022-04-23 DIAGNOSIS — M4722 Other spondylosis with radiculopathy, cervical region: Secondary | ICD-10-CM | POA: Diagnosis not present

## 2022-04-23 DIAGNOSIS — D751 Secondary polycythemia: Secondary | ICD-10-CM | POA: Diagnosis not present

## 2022-04-23 DIAGNOSIS — E538 Deficiency of other specified B group vitamins: Secondary | ICD-10-CM | POA: Diagnosis not present

## 2022-04-23 DIAGNOSIS — G713 Mitochondrial myopathy, not elsewhere classified: Secondary | ICD-10-CM | POA: Diagnosis not present

## 2022-04-23 DIAGNOSIS — E119 Type 2 diabetes mellitus without complications: Secondary | ICD-10-CM | POA: Diagnosis not present

## 2022-04-23 DIAGNOSIS — G3184 Mild cognitive impairment, so stated: Secondary | ICD-10-CM | POA: Diagnosis not present

## 2022-04-23 DIAGNOSIS — R569 Unspecified convulsions: Secondary | ICD-10-CM | POA: Diagnosis not present

## 2022-04-23 DIAGNOSIS — E785 Hyperlipidemia, unspecified: Secondary | ICD-10-CM | POA: Diagnosis not present

## 2022-04-24 ENCOUNTER — Encounter: Payer: Self-pay | Admitting: Family Medicine

## 2022-04-24 DIAGNOSIS — E538 Deficiency of other specified B group vitamins: Secondary | ICD-10-CM | POA: Diagnosis not present

## 2022-04-24 DIAGNOSIS — D751 Secondary polycythemia: Secondary | ICD-10-CM | POA: Diagnosis not present

## 2022-04-24 DIAGNOSIS — G3184 Mild cognitive impairment, so stated: Secondary | ICD-10-CM | POA: Diagnosis not present

## 2022-04-24 DIAGNOSIS — K219 Gastro-esophageal reflux disease without esophagitis: Secondary | ICD-10-CM | POA: Diagnosis not present

## 2022-04-24 DIAGNOSIS — E785 Hyperlipidemia, unspecified: Secondary | ICD-10-CM | POA: Diagnosis not present

## 2022-04-24 DIAGNOSIS — M542 Cervicalgia: Secondary | ICD-10-CM | POA: Diagnosis not present

## 2022-04-24 DIAGNOSIS — Z9181 History of falling: Secondary | ICD-10-CM | POA: Diagnosis not present

## 2022-04-24 DIAGNOSIS — E119 Type 2 diabetes mellitus without complications: Secondary | ICD-10-CM | POA: Diagnosis not present

## 2022-04-24 DIAGNOSIS — M4722 Other spondylosis with radiculopathy, cervical region: Secondary | ICD-10-CM | POA: Diagnosis not present

## 2022-04-24 DIAGNOSIS — R569 Unspecified convulsions: Secondary | ICD-10-CM | POA: Diagnosis not present

## 2022-04-24 DIAGNOSIS — Z87891 Personal history of nicotine dependence: Secondary | ICD-10-CM | POA: Diagnosis not present

## 2022-04-24 DIAGNOSIS — Z7985 Long-term (current) use of injectable non-insulin antidiabetic drugs: Secondary | ICD-10-CM | POA: Diagnosis not present

## 2022-04-24 DIAGNOSIS — I10 Essential (primary) hypertension: Secondary | ICD-10-CM | POA: Diagnosis not present

## 2022-04-24 DIAGNOSIS — G713 Mitochondrial myopathy, not elsewhere classified: Secondary | ICD-10-CM | POA: Diagnosis not present

## 2022-04-29 DIAGNOSIS — G3184 Mild cognitive impairment, so stated: Secondary | ICD-10-CM | POA: Diagnosis not present

## 2022-04-29 DIAGNOSIS — Z87891 Personal history of nicotine dependence: Secondary | ICD-10-CM | POA: Diagnosis not present

## 2022-04-29 DIAGNOSIS — E785 Hyperlipidemia, unspecified: Secondary | ICD-10-CM | POA: Diagnosis not present

## 2022-04-29 DIAGNOSIS — K219 Gastro-esophageal reflux disease without esophagitis: Secondary | ICD-10-CM | POA: Diagnosis not present

## 2022-04-29 DIAGNOSIS — R569 Unspecified convulsions: Secondary | ICD-10-CM | POA: Diagnosis not present

## 2022-04-29 DIAGNOSIS — G713 Mitochondrial myopathy, not elsewhere classified: Secondary | ICD-10-CM | POA: Diagnosis not present

## 2022-04-29 DIAGNOSIS — Z7985 Long-term (current) use of injectable non-insulin antidiabetic drugs: Secondary | ICD-10-CM | POA: Diagnosis not present

## 2022-04-29 DIAGNOSIS — E119 Type 2 diabetes mellitus without complications: Secondary | ICD-10-CM | POA: Diagnosis not present

## 2022-04-29 DIAGNOSIS — I10 Essential (primary) hypertension: Secondary | ICD-10-CM | POA: Diagnosis not present

## 2022-04-29 DIAGNOSIS — E538 Deficiency of other specified B group vitamins: Secondary | ICD-10-CM | POA: Diagnosis not present

## 2022-04-29 DIAGNOSIS — D751 Secondary polycythemia: Secondary | ICD-10-CM | POA: Diagnosis not present

## 2022-04-29 DIAGNOSIS — M542 Cervicalgia: Secondary | ICD-10-CM | POA: Diagnosis not present

## 2022-04-29 DIAGNOSIS — Z9181 History of falling: Secondary | ICD-10-CM | POA: Diagnosis not present

## 2022-04-29 DIAGNOSIS — M4722 Other spondylosis with radiculopathy, cervical region: Secondary | ICD-10-CM | POA: Diagnosis not present

## 2022-04-30 DIAGNOSIS — I10 Essential (primary) hypertension: Secondary | ICD-10-CM | POA: Diagnosis not present

## 2022-04-30 DIAGNOSIS — G713 Mitochondrial myopathy, not elsewhere classified: Secondary | ICD-10-CM | POA: Diagnosis not present

## 2022-04-30 DIAGNOSIS — E785 Hyperlipidemia, unspecified: Secondary | ICD-10-CM | POA: Diagnosis not present

## 2022-04-30 DIAGNOSIS — E538 Deficiency of other specified B group vitamins: Secondary | ICD-10-CM | POA: Diagnosis not present

## 2022-04-30 DIAGNOSIS — M542 Cervicalgia: Secondary | ICD-10-CM | POA: Diagnosis not present

## 2022-04-30 DIAGNOSIS — D751 Secondary polycythemia: Secondary | ICD-10-CM | POA: Diagnosis not present

## 2022-04-30 DIAGNOSIS — E119 Type 2 diabetes mellitus without complications: Secondary | ICD-10-CM | POA: Diagnosis not present

## 2022-04-30 DIAGNOSIS — K219 Gastro-esophageal reflux disease without esophagitis: Secondary | ICD-10-CM | POA: Diagnosis not present

## 2022-04-30 DIAGNOSIS — G3184 Mild cognitive impairment, so stated: Secondary | ICD-10-CM | POA: Diagnosis not present

## 2022-04-30 DIAGNOSIS — Z87891 Personal history of nicotine dependence: Secondary | ICD-10-CM | POA: Diagnosis not present

## 2022-04-30 DIAGNOSIS — Z7985 Long-term (current) use of injectable non-insulin antidiabetic drugs: Secondary | ICD-10-CM | POA: Diagnosis not present

## 2022-04-30 DIAGNOSIS — Z9181 History of falling: Secondary | ICD-10-CM | POA: Diagnosis not present

## 2022-04-30 DIAGNOSIS — R569 Unspecified convulsions: Secondary | ICD-10-CM | POA: Diagnosis not present

## 2022-04-30 DIAGNOSIS — M4722 Other spondylosis with radiculopathy, cervical region: Secondary | ICD-10-CM | POA: Diagnosis not present

## 2022-05-01 ENCOUNTER — Other Ambulatory Visit: Payer: Self-pay | Admitting: Family Medicine

## 2022-05-01 MED ORDER — TRULICITY 0.75 MG/0.5ML ~~LOC~~ SOAJ: 0.7500 mg | SUBCUTANEOUS | 0 refills | Status: DC

## 2022-05-05 DIAGNOSIS — E119 Type 2 diabetes mellitus without complications: Secondary | ICD-10-CM | POA: Diagnosis not present

## 2022-05-05 DIAGNOSIS — Z7985 Long-term (current) use of injectable non-insulin antidiabetic drugs: Secondary | ICD-10-CM | POA: Diagnosis not present

## 2022-05-05 DIAGNOSIS — M542 Cervicalgia: Secondary | ICD-10-CM | POA: Diagnosis not present

## 2022-05-05 DIAGNOSIS — Z87891 Personal history of nicotine dependence: Secondary | ICD-10-CM | POA: Diagnosis not present

## 2022-05-05 DIAGNOSIS — K219 Gastro-esophageal reflux disease without esophagitis: Secondary | ICD-10-CM | POA: Diagnosis not present

## 2022-05-05 DIAGNOSIS — E785 Hyperlipidemia, unspecified: Secondary | ICD-10-CM | POA: Diagnosis not present

## 2022-05-05 DIAGNOSIS — R569 Unspecified convulsions: Secondary | ICD-10-CM | POA: Diagnosis not present

## 2022-05-05 DIAGNOSIS — I10 Essential (primary) hypertension: Secondary | ICD-10-CM | POA: Diagnosis not present

## 2022-05-05 DIAGNOSIS — D751 Secondary polycythemia: Secondary | ICD-10-CM | POA: Diagnosis not present

## 2022-05-05 DIAGNOSIS — Z9181 History of falling: Secondary | ICD-10-CM | POA: Diagnosis not present

## 2022-05-05 DIAGNOSIS — M4722 Other spondylosis with radiculopathy, cervical region: Secondary | ICD-10-CM | POA: Diagnosis not present

## 2022-05-05 DIAGNOSIS — G713 Mitochondrial myopathy, not elsewhere classified: Secondary | ICD-10-CM | POA: Diagnosis not present

## 2022-05-05 DIAGNOSIS — G3184 Mild cognitive impairment, so stated: Secondary | ICD-10-CM | POA: Diagnosis not present

## 2022-05-05 DIAGNOSIS — E538 Deficiency of other specified B group vitamins: Secondary | ICD-10-CM | POA: Diagnosis not present

## 2022-05-06 DIAGNOSIS — Z9181 History of falling: Secondary | ICD-10-CM | POA: Diagnosis not present

## 2022-05-06 DIAGNOSIS — G3184 Mild cognitive impairment, so stated: Secondary | ICD-10-CM | POA: Diagnosis not present

## 2022-05-06 DIAGNOSIS — K219 Gastro-esophageal reflux disease without esophagitis: Secondary | ICD-10-CM | POA: Diagnosis not present

## 2022-05-06 DIAGNOSIS — E538 Deficiency of other specified B group vitamins: Secondary | ICD-10-CM | POA: Diagnosis not present

## 2022-05-06 DIAGNOSIS — M4722 Other spondylosis with radiculopathy, cervical region: Secondary | ICD-10-CM | POA: Diagnosis not present

## 2022-05-06 DIAGNOSIS — M542 Cervicalgia: Secondary | ICD-10-CM | POA: Diagnosis not present

## 2022-05-06 DIAGNOSIS — G713 Mitochondrial myopathy, not elsewhere classified: Secondary | ICD-10-CM | POA: Diagnosis not present

## 2022-05-06 DIAGNOSIS — I10 Essential (primary) hypertension: Secondary | ICD-10-CM | POA: Diagnosis not present

## 2022-05-06 DIAGNOSIS — E119 Type 2 diabetes mellitus without complications: Secondary | ICD-10-CM | POA: Diagnosis not present

## 2022-05-06 DIAGNOSIS — R569 Unspecified convulsions: Secondary | ICD-10-CM | POA: Diagnosis not present

## 2022-05-06 DIAGNOSIS — D751 Secondary polycythemia: Secondary | ICD-10-CM | POA: Diagnosis not present

## 2022-05-06 DIAGNOSIS — Z87891 Personal history of nicotine dependence: Secondary | ICD-10-CM | POA: Diagnosis not present

## 2022-05-06 DIAGNOSIS — E785 Hyperlipidemia, unspecified: Secondary | ICD-10-CM | POA: Diagnosis not present

## 2022-05-06 DIAGNOSIS — Z7985 Long-term (current) use of injectable non-insulin antidiabetic drugs: Secondary | ICD-10-CM | POA: Diagnosis not present

## 2022-05-07 NOTE — Progress Notes (Signed)
Name: [name] [name]   MRN: [id number]    DOB: 09/19/1961   Date:05/08/2022 ? ?     Progress Note ? ?Subjective ? ?Chief Complaint ? ?Follow Up ? ?HPI ? ?DMII: A1C has been well controlled   He is off Lantus, Trajenta, Metformin , we also stopped [place] in March 22 , during his last visit he was using Trulicity every other week and A1C was 5.5 % and glucose between 115-120, so we stopped Trulicity and started him on Farxiga . He called a couple of weeks ago stating his glucose was spiking again in the 200's , he is back on Trulicity and H0W is now 5 %. He states he has noticed episode of hypoglycemia. He would like to stop [place] and stay on Trulicity only  . He is avoid fried food but has been eating a lot of rice and frech fries.  ? 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  He also has dysphagia - takes PPI but may be from muscular dysfunction  ?  ?A. Skeletal muscle, left vastus lateralis, biopsy: ?  ?- Myopathic changes with mitochondrial and lipid abnormalities, most concerning for a mitochondrial myopathy. See comment. ?- Mild, chronic neurogenic atrophy.  ?  ?Constipation: doing well on Amitiza, down to once daily pill  , Bristol scale of 4 , no longer straining, has increased fiber in his diet, he has bowel movements now at least once a day, occasionally has bristol scale 1 for a couple of days.     ?  ?HTN/Angina : He is taking carvediolol but not sure if taking losartan hctz. He is on ARB  and Ranexa, off Imdur due to hypotension. Denies recent chest pain or palpitation lately  ? ?Cardiac cath done 07/2021 ?  ?  Ost LAD to Mid LAD lesion is 10% stenosed. ?  Mid LAD lesion is 30% stenosed ?  ?  ?SOB: going on since Fall 2021 - he has been evaluated by pulmonologist and cardiologist and no answers yet, cardiac cath unremarkable, also negative  CT chest. Stable. He is now  getting Breo for possible asthma  ?  ?Hyperlipidemia: compliant with medication, reviewed last labs , LDL at goal it was 68 , he is on Zetia only , off statin therapy due to myopathy  ?  ?Seizure like activity  : still under the care of Dr. Melrose Nakayama, still has tremors when standing up, but no seziure like activity lately, he has episodes of dizziness and headaches. . He has a history of neck surgery and is on Lyrica . Unchanged  ?  ?Low B12: last level was high, she is taking otc supplementation weekly  ?  ?OSA: he is wearing CPAP every night,  he states compliant with machine  but not sleeping well due to muscle cramping at night He has Temazepam to help him sleep  ?  ?Cervical left radiculopathy: seen by Dr. Jobe Igo and PT advised since his SOB and recent health issues makes him a poor candidate for surgery at this time. Unchanged  ?  ?Imaging: ?MRI C spine 08/26/2021 ?IMPRESSION:  ?1. Multilevel spondylosis of the cervical spine as described.  ?2. Moderate foraminal narrowing bilaterally at C2-3, right greater  ?than left.  ?3. Moderate right mild left foraminal narrowing at C3-4.  ?4. Moderate foraminal narrowing bilaterally at C5-6 and C6-7 is  ?worse on  the right.  ?5. Mild central canal narrowing at C6-7.  ?  ?Malnutrition: BMI is now down to 30, he has lost over 50 lbs in the past 18 month , he continues to lose weight.  ?  ?Enlarged thyroid: CT done 03/2021 at [facility], showed goiter and compression of trachea , TSH has been at goal  ?  ?Patient Active Problem List  ? Diagnosis Date Noted  ? 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  ? Tremor 01/22/2022  ? Weakness 01/22/2022  ? Syncope 01/22/2022  ? Shortness of breath 01/22/2022  ? Elevated CK 11/10/2021  ? Muscle atrophy of lower extremity 11/10/2021  ? Pica 11/10/2021  ? Loss of weight   ? Dysphagia   ? SOB (shortness of breath)   ? Asthma 05/14/2021  ? Multiple thyroid nodules 05/14/2021  ? Localized, primary osteoarthritis of hand 09/28/2019  ? Seizure ([place])  02/22/2018  ? Mild cognitive impairment 11/17/2017  ? Radiculopathy of cervical region 11/17/2017  ? Stenosis of carotid artery 09/08/2017  ? Cervical stenosis of spine 09/08/2017  ? Headache disorder 02/15/2017  ? Memory loss or impairment 02/15/2017  ? Coronary artery disease involving native coronary artery of native heart   ? Radiculitis of left cervical region 03/05/2016  ? Diabetic neuropathy associated with type 2 diabetes mellitus ([place]) 12/05/2015  ? Patellar subluxation 06/05/2015  ? Allergic rhinitis, seasonal 06/05/2015  ? Chronic constipation 06/05/2015  ? Chronic lower back pain 06/05/2015  ? Vitamin D deficiency 06/05/2015  ? Central sleep apnea 06/05/2015  ? 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 Nerve root pain 06/05/2015  ? Acquired polycythemia 06/05/2015  ? Anterior knee pain 06/05/2015  ? Dysmetabolic syndrome 74/25/9563  ? Failure of erection 06/05/2015  ? Gastro-esophageal reflux disease without esophagitis 06/05/2015  ? Neuropathy 06/05/2015  ? CAD (coronary artery disease)   ? Angina, class III ([place]) 05/23/2015  ? Essential hypertension 05/23/2015  ? Hyperlipidemia 05/23/2015  ? Inguinal hernia without mention of obstruction or gangrene, recurrent unilateral or unspecified 03/24/2013  ? ? ?Past Surgical History:  ?Procedure Laterality Date  ? BACK SURGERY  12/2008  ? X2-LUMBAR  ? CARDIAC CATHETERIZATION N/A 05/30/2015  ? Procedure: Left Heart Cath;  Surgeon: Wellington Hampshire, MD;  Location: [place] [facility];  Service: Cardiovascular;  Laterality: N/A;  ? CARDIAC CATHETERIZATION N/A 04/16/2016  ? Procedure: Left Heart Cath and Coronary Angiography;  Surgeon: Wellington Hampshire, MD;  Location: [place] [facility];  Service: Cardiovascular;  Laterality: N/A;  ? COLONOSCOPY  2014  ? Dr. [name]  ? COLONOSCOPY WITH PROPOFOL N/A 12/25/2016  ? Procedure: COLONOSCOPY WITH PROPOFOL;  Surgeon: Jonathon Bellows, MD;  Location: [facility] ENDOSCOPY;  Service: Endoscopy;  Laterality: N/A;  ? COLONOSCOPY WITH PROPOFOL N/A  01/29/2017  ? Procedure: COLONOSCOPY WITH PROPOFOL;  Surgeon: Jonathon Bellows, MD;  Location: [facility] ENDOSCOPY;  Service: Endoscopy;  Laterality: N/A;  ? COLONOSCOPY WITH PROPOFOL N/A 04/11/2020  ? Procedure: COLONOSCOPY WITH PROPOFOL;  Surgeon: Jonathon Bellows, MD;  Location: [facility] ENDOSCOPY;  Service: Gastroenterology;  Laterality: N/A;  ? ESOPHAGEAL MANOMETRY N/A 03/04/2022  ? Procedure: ESOPHAGEAL MANOMETRY (EM);  Surgeon: Mauri Pole, MD;  Location: [facility] ENDOSCOPY;  Service: Gastroenterology;  Laterality: N/A;  ? ESOPHAGOGASTRODUODENOSCOPY (EGD) WITH PROPOFOL N/A 01/26/2019  ? Procedure: ESOPHAGOGASTRODUODENOSCOPY (EGD) WITH PROPOFOL;  Surgeon: Lin Landsman, MD;  Location: [facility] ENDOSCOPY;  Service: Gastroenterology;  Laterality: N/A;  ? ESOPHAGOGASTRODUODENOSCOPY (EGD) WITH PROPOFOL N/A 09/10/2021  ? Procedure: ESOPHAGOGASTRODUODENOSCOPY (EGD) WITH PROPOFOL;  Surgeon: Lin Landsman, MD;  Location:  [facility] ENDOSCOPY;  Service: Gastroenterology;  Laterality: N/A;  ? EVALUATION UNDER ANESTHESIA WITH HEMORRHOIDECTOMY N/A 02/24/2017  ? Procedure: EXAM UNDER ANESTHESIA WITH POSSIBLE EXCISION OF INTERNAL HEMORRHOIDS;  Surgeon: Olean Ree, MD;  Location: [facility] ORS;  Service: General;  Laterality: N/A;  ? FISSURECTOMY  02/24/2017  ? Procedure: FISSURECTOMY;  Surgeon: Olean Ree, MD;  Location: [facility] ORS;  Service: General;;  ? HAND SURGERY Left 2020  ? HERNIA REPAIR Right 1991  ? INGUINAL HERNIA REPAIR Right 2012  ? Dr [name]  ? INGUINAL HERNIA REPAIR Right 2014  ? Dr [name]  ? INTRAVASCULAR PRESSURE WIRE/FFR STUDY N/A 07/30/2021  ? Procedure: INTRAVASCULAR PRESSURE WIRE/FFR STUDY;  Surgeon: Wellington Hampshire, MD;  Location: [place] [facility];  Service: Cardiovascular;  Laterality: N/A;  ? LEFT HEART CATH AND CORONARY ANGIOGRAPHY Left 08/05/2020  ? Procedure: LEFT HEART CATH AND CORONARY ANGIOGRAPHY poss PCI;  Surgeon: Wellington Hampshire, MD;  Location: [place] [facility];  Service: Cardiovascular;  Laterality: Left;  ?  MUSCLE BIOPSY Left 12/31/2021  ? Procedure: LEFT VASTUS LATERALIS BIOPSY;  Surgeon: Meade Maw, MD;  Location: [facility] ORS;  Service: Neurosurgery;  Laterality: Left;  ? RIGHT/LEFT HEART CATH AND CORONARY ANGIOGRAPHY N

## 2022-05-08 ENCOUNTER — Encounter: Payer: Self-pay | Admitting: Family Medicine

## 2022-05-08 ENCOUNTER — Ambulatory Visit: Payer: Medicaid Other | Admitting: Family Medicine

## 2022-05-08 VITALS — BP 132/76 | HR 89 | Resp 16 | Ht 71.0 in | Wt 207.0 lb

## 2022-05-08 DIAGNOSIS — R809 Proteinuria, unspecified: Secondary | ICD-10-CM | POA: Diagnosis not present

## 2022-05-08 DIAGNOSIS — I209 Angina pectoris, unspecified: Secondary | ICD-10-CM

## 2022-05-08 DIAGNOSIS — I25118 Atherosclerotic heart disease of native coronary artery with other forms of angina pectoris: Secondary | ICD-10-CM

## 2022-05-08 DIAGNOSIS — I10 Essential (primary) hypertension: Secondary | ICD-10-CM

## 2022-05-08 DIAGNOSIS — R569 Unspecified convulsions: Secondary | ICD-10-CM

## 2022-05-08 DIAGNOSIS — G4709 Other insomnia: Secondary | ICD-10-CM | POA: Diagnosis not present

## 2022-05-08 DIAGNOSIS — F5101 Primary insomnia: Secondary | ICD-10-CM | POA: Insufficient documentation

## 2022-05-08 DIAGNOSIS — Z5309 Procedure and treatment not carried out because of other contraindication: Secondary | ICD-10-CM | POA: Diagnosis not present

## 2022-05-08 DIAGNOSIS — K219 Gastro-esophageal reflux disease without esophagitis: Secondary | ICD-10-CM

## 2022-05-08 DIAGNOSIS — R1313 Dysphagia, pharyngeal phase: Secondary | ICD-10-CM | POA: Diagnosis not present

## 2022-05-08 DIAGNOSIS — E7800 Pure hypercholesterolemia, unspecified: Secondary | ICD-10-CM | POA: Diagnosis not present

## 2022-05-08 DIAGNOSIS — M6250 Muscle wasting and atrophy, not elsewhere classified, unspecified site: Secondary | ICD-10-CM | POA: Diagnosis not present

## 2022-05-08 DIAGNOSIS — E1129 Type 2 diabetes mellitus with other diabetic kidney complication: Secondary | ICD-10-CM | POA: Diagnosis not present

## 2022-05-08 DIAGNOSIS — G713 Mitochondrial myopathy, not elsewhere classified: Secondary | ICD-10-CM | POA: Diagnosis not present

## 2022-05-08 LAB — POCT GLYCOSYLATED HEMOGLOBIN (HGB A1C): Hemoglobin A1C: 5 % (ref 4.0–5.6)

## 2022-05-08 MED ORDER — TRULICITY 0.75 MG/0.5ML ~~LOC~~ SOAJ: 0.7500 mg | SUBCUTANEOUS | 0 refills | Status: DC

## 2022-05-08 MED ORDER — TEMAZEPAM 15 MG PO CAPS: 15.0000 mg | ORAL_CAPSULE | Freq: Every day | ORAL | 0 refills | Status: DC

## 2022-05-08 NOTE — Assessment & Plan Note (Signed)
Under the care of Dr. Marius Ditch ?

## 2022-05-08 NOTE — Assessment & Plan Note (Signed)
Sees Dr Fletcher Anon ?Not on statin therapy to due mitochondrial myopathy  ?Taking Zetia and Ranexa  ?

## 2022-05-08 NOTE — Assessment & Plan Note (Signed)
He would like to stop [place] and take Trulicity only  ?Reviewed his diet. He has been eating fries, rice and pasta, discussed low carb diet  ?

## 2022-05-08 NOTE — Assessment & Plan Note (Signed)
No episodes lately, diagnosed during episodes of syncope  ?Saw neurologist and is not on medications  ?

## 2022-05-08 NOTE — Assessment & Plan Note (Signed)
Keep follow up with Dr. Fletcher Anon ?

## 2022-05-08 NOTE — Assessment & Plan Note (Signed)
Under the care of Dr. Marius Ditch ?Likely from myopathy  ?

## 2022-05-08 NOTE — Assessment & Plan Note (Signed)
mitochondrial myopathy  ?

## 2022-05-08 NOTE — Assessment & Plan Note (Signed)
Looked at his list, he did not bring his medications. He is not sure if he is taking losartan hctz  ?bp is at goal for him  ?

## 2022-05-08 NOTE — Assessment & Plan Note (Signed)
On zetia.  

## 2022-05-11 ENCOUNTER — Encounter: Payer: Self-pay | Admitting: Cardiovascular Disease

## 2022-05-11 ENCOUNTER — Encounter: Payer: Self-pay | Admitting: Family Medicine

## 2022-05-11 ENCOUNTER — Other Ambulatory Visit: Payer: Self-pay

## 2022-05-11 MED ORDER — LOSARTAN POTASSIUM-HCTZ 50-12.5 MG PO TABS
1.0000 | ORAL_TABLET | Freq: Every day | ORAL | 0 refills | Status: DC
Start: 2022-05-11 — End: 2022-06-08

## 2022-05-14 DIAGNOSIS — E538 Deficiency of other specified B group vitamins: Secondary | ICD-10-CM | POA: Diagnosis not present

## 2022-05-14 DIAGNOSIS — G3184 Mild cognitive impairment, so stated: Secondary | ICD-10-CM | POA: Diagnosis not present

## 2022-05-14 DIAGNOSIS — E785 Hyperlipidemia, unspecified: Secondary | ICD-10-CM | POA: Diagnosis not present

## 2022-05-14 DIAGNOSIS — K219 Gastro-esophageal reflux disease without esophagitis: Secondary | ICD-10-CM | POA: Diagnosis not present

## 2022-05-14 DIAGNOSIS — Z9181 History of falling: Secondary | ICD-10-CM | POA: Diagnosis not present

## 2022-05-14 DIAGNOSIS — Z7985 Long-term (current) use of injectable non-insulin antidiabetic drugs: Secondary | ICD-10-CM | POA: Diagnosis not present

## 2022-05-14 DIAGNOSIS — E119 Type 2 diabetes mellitus without complications: Secondary | ICD-10-CM | POA: Diagnosis not present

## 2022-05-14 DIAGNOSIS — G713 Mitochondrial myopathy, not elsewhere classified: Secondary | ICD-10-CM | POA: Diagnosis not present

## 2022-05-14 DIAGNOSIS — I10 Essential (primary) hypertension: Secondary | ICD-10-CM | POA: Diagnosis not present

## 2022-05-14 DIAGNOSIS — R569 Unspecified convulsions: Secondary | ICD-10-CM | POA: Diagnosis not present

## 2022-05-14 DIAGNOSIS — D751 Secondary polycythemia: Secondary | ICD-10-CM | POA: Diagnosis not present

## 2022-05-14 DIAGNOSIS — M4722 Other spondylosis with radiculopathy, cervical region: Secondary | ICD-10-CM | POA: Diagnosis not present

## 2022-05-14 DIAGNOSIS — M542 Cervicalgia: Secondary | ICD-10-CM | POA: Diagnosis not present

## 2022-05-14 DIAGNOSIS — Z87891 Personal history of nicotine dependence: Secondary | ICD-10-CM | POA: Diagnosis not present

## 2022-05-19 DIAGNOSIS — K219 Gastro-esophageal reflux disease without esophagitis: Secondary | ICD-10-CM | POA: Diagnosis not present

## 2022-05-19 DIAGNOSIS — G3184 Mild cognitive impairment, so stated: Secondary | ICD-10-CM | POA: Diagnosis not present

## 2022-05-19 DIAGNOSIS — E538 Deficiency of other specified B group vitamins: Secondary | ICD-10-CM | POA: Diagnosis not present

## 2022-05-19 DIAGNOSIS — Z87891 Personal history of nicotine dependence: Secondary | ICD-10-CM | POA: Diagnosis not present

## 2022-05-19 DIAGNOSIS — D751 Secondary polycythemia: Secondary | ICD-10-CM | POA: Diagnosis not present

## 2022-05-19 DIAGNOSIS — E785 Hyperlipidemia, unspecified: Secondary | ICD-10-CM | POA: Diagnosis not present

## 2022-05-19 DIAGNOSIS — E119 Type 2 diabetes mellitus without complications: Secondary | ICD-10-CM | POA: Diagnosis not present

## 2022-05-19 DIAGNOSIS — M542 Cervicalgia: Secondary | ICD-10-CM | POA: Diagnosis not present

## 2022-05-19 DIAGNOSIS — R569 Unspecified convulsions: Secondary | ICD-10-CM | POA: Diagnosis not present

## 2022-05-19 DIAGNOSIS — I10 Essential (primary) hypertension: Secondary | ICD-10-CM | POA: Diagnosis not present

## 2022-05-19 DIAGNOSIS — Z9181 History of falling: Secondary | ICD-10-CM | POA: Diagnosis not present

## 2022-05-19 DIAGNOSIS — G713 Mitochondrial myopathy, not elsewhere classified: Secondary | ICD-10-CM | POA: Diagnosis not present

## 2022-05-19 DIAGNOSIS — Z7985 Long-term (current) use of injectable non-insulin antidiabetic drugs: Secondary | ICD-10-CM | POA: Diagnosis not present

## 2022-05-19 DIAGNOSIS — M4722 Other spondylosis with radiculopathy, cervical region: Secondary | ICD-10-CM | POA: Diagnosis not present

## 2022-05-21 DIAGNOSIS — M542 Cervicalgia: Secondary | ICD-10-CM | POA: Diagnosis not present

## 2022-05-21 DIAGNOSIS — R569 Unspecified convulsions: Secondary | ICD-10-CM | POA: Diagnosis not present

## 2022-05-21 DIAGNOSIS — I10 Essential (primary) hypertension: Secondary | ICD-10-CM | POA: Diagnosis not present

## 2022-05-21 DIAGNOSIS — G3184 Mild cognitive impairment, so stated: Secondary | ICD-10-CM | POA: Diagnosis not present

## 2022-05-21 DIAGNOSIS — D751 Secondary polycythemia: Secondary | ICD-10-CM | POA: Diagnosis not present

## 2022-05-21 DIAGNOSIS — E538 Deficiency of other specified B group vitamins: Secondary | ICD-10-CM | POA: Diagnosis not present

## 2022-05-21 DIAGNOSIS — Z7985 Long-term (current) use of injectable non-insulin antidiabetic drugs: Secondary | ICD-10-CM | POA: Diagnosis not present

## 2022-05-21 DIAGNOSIS — Z9181 History of falling: Secondary | ICD-10-CM | POA: Diagnosis not present

## 2022-05-21 DIAGNOSIS — E119 Type 2 diabetes mellitus without complications: Secondary | ICD-10-CM | POA: Diagnosis not present

## 2022-05-21 DIAGNOSIS — E785 Hyperlipidemia, unspecified: Secondary | ICD-10-CM | POA: Diagnosis not present

## 2022-05-21 DIAGNOSIS — M4722 Other spondylosis with radiculopathy, cervical region: Secondary | ICD-10-CM | POA: Diagnosis not present

## 2022-05-21 DIAGNOSIS — K219 Gastro-esophageal reflux disease without esophagitis: Secondary | ICD-10-CM | POA: Diagnosis not present

## 2022-05-21 DIAGNOSIS — Z87891 Personal history of nicotine dependence: Secondary | ICD-10-CM | POA: Diagnosis not present

## 2022-05-21 DIAGNOSIS — G713 Mitochondrial myopathy, not elsewhere classified: Secondary | ICD-10-CM | POA: Diagnosis not present

## 2022-05-28 DIAGNOSIS — G3184 Mild cognitive impairment, so stated: Secondary | ICD-10-CM | POA: Diagnosis not present

## 2022-05-28 DIAGNOSIS — E119 Type 2 diabetes mellitus without complications: Secondary | ICD-10-CM | POA: Diagnosis not present

## 2022-05-28 DIAGNOSIS — Z7985 Long-term (current) use of injectable non-insulin antidiabetic drugs: Secondary | ICD-10-CM | POA: Diagnosis not present

## 2022-05-28 DIAGNOSIS — G713 Mitochondrial myopathy, not elsewhere classified: Secondary | ICD-10-CM | POA: Diagnosis not present

## 2022-05-28 DIAGNOSIS — K219 Gastro-esophageal reflux disease without esophagitis: Secondary | ICD-10-CM | POA: Diagnosis not present

## 2022-05-28 DIAGNOSIS — R569 Unspecified convulsions: Secondary | ICD-10-CM | POA: Diagnosis not present

## 2022-05-28 DIAGNOSIS — M4722 Other spondylosis with radiculopathy, cervical region: Secondary | ICD-10-CM | POA: Diagnosis not present

## 2022-05-28 DIAGNOSIS — M542 Cervicalgia: Secondary | ICD-10-CM | POA: Diagnosis not present

## 2022-05-28 DIAGNOSIS — Z9181 History of falling: Secondary | ICD-10-CM | POA: Diagnosis not present

## 2022-05-28 DIAGNOSIS — Z87891 Personal history of nicotine dependence: Secondary | ICD-10-CM | POA: Diagnosis not present

## 2022-05-28 DIAGNOSIS — E538 Deficiency of other specified B group vitamins: Secondary | ICD-10-CM | POA: Diagnosis not present

## 2022-05-28 DIAGNOSIS — I10 Essential (primary) hypertension: Secondary | ICD-10-CM | POA: Diagnosis not present

## 2022-05-28 DIAGNOSIS — E785 Hyperlipidemia, unspecified: Secondary | ICD-10-CM | POA: Diagnosis not present

## 2022-05-28 DIAGNOSIS — D751 Secondary polycythemia: Secondary | ICD-10-CM | POA: Diagnosis not present

## 2022-06-06 ENCOUNTER — Other Ambulatory Visit: Payer: Self-pay | Admitting: Physician Assistant

## 2022-06-23 ENCOUNTER — Other Ambulatory Visit: Payer: Self-pay | Admitting: Family Medicine

## 2022-06-23 DIAGNOSIS — R809 Proteinuria, unspecified: Secondary | ICD-10-CM

## 2022-06-24 ENCOUNTER — Encounter: Payer: Self-pay | Admitting: Family Medicine

## 2022-06-24 ENCOUNTER — Other Ambulatory Visit: Payer: Self-pay | Admitting: Family Medicine

## 2022-06-25 ENCOUNTER — Other Ambulatory Visit: Payer: Self-pay

## 2022-06-25 DIAGNOSIS — N529 Male erectile dysfunction, unspecified: Secondary | ICD-10-CM

## 2022-06-25 MED ORDER — SILDENAFIL CITRATE 100 MG PO TABS: 100.0000 mg | ORAL_TABLET | Freq: Every day | ORAL | 1 refills | Status: DC | PRN

## 2022-07-01 ENCOUNTER — Encounter: Payer: Self-pay | Admitting: Family Medicine

## 2022-07-03 ENCOUNTER — Ambulatory Visit: Payer: Medicaid Other | Admitting: Medical

## 2022-07-03 DIAGNOSIS — R569 Unspecified convulsions: Secondary | ICD-10-CM | POA: Diagnosis not present

## 2022-07-03 DIAGNOSIS — G713 Mitochondrial myopathy, not elsewhere classified: Secondary | ICD-10-CM | POA: Diagnosis not present

## 2022-07-03 DIAGNOSIS — G629 Polyneuropathy, unspecified: Secondary | ICD-10-CM | POA: Diagnosis not present

## 2022-07-03 DIAGNOSIS — R531 Weakness: Secondary | ICD-10-CM | POA: Diagnosis not present

## 2022-07-03 DIAGNOSIS — R0602 Shortness of breath: Secondary | ICD-10-CM | POA: Diagnosis not present

## 2022-07-05 ENCOUNTER — Other Ambulatory Visit: Payer: Self-pay | Admitting: Family Medicine

## 2022-07-06 ENCOUNTER — Other Ambulatory Visit: Payer: Self-pay | Admitting: Physician Assistant

## 2022-07-06 DIAGNOSIS — Z9989 Dependence on other enabling machines and devices: Secondary | ICD-10-CM | POA: Diagnosis not present

## 2022-07-06 DIAGNOSIS — E1142 Type 2 diabetes mellitus with diabetic polyneuropathy: Secondary | ICD-10-CM | POA: Diagnosis not present

## 2022-07-06 DIAGNOSIS — G4733 Obstructive sleep apnea (adult) (pediatric): Secondary | ICD-10-CM | POA: Diagnosis not present

## 2022-07-06 DIAGNOSIS — Z7982 Long term (current) use of aspirin: Secondary | ICD-10-CM | POA: Diagnosis not present

## 2022-07-06 DIAGNOSIS — G3184 Mild cognitive impairment, so stated: Secondary | ICD-10-CM | POA: Diagnosis not present

## 2022-07-06 DIAGNOSIS — Z7985 Long-term (current) use of injectable non-insulin antidiabetic drugs: Secondary | ICD-10-CM | POA: Diagnosis not present

## 2022-07-06 DIAGNOSIS — G713 Mitochondrial myopathy, not elsewhere classified: Secondary | ICD-10-CM | POA: Diagnosis not present

## 2022-07-06 DIAGNOSIS — Z9181 History of falling: Secondary | ICD-10-CM | POA: Diagnosis not present

## 2022-07-06 DIAGNOSIS — M4802 Spinal stenosis, cervical region: Secondary | ICD-10-CM | POA: Diagnosis not present

## 2022-07-06 DIAGNOSIS — Z7951 Long term (current) use of inhaled steroids: Secondary | ICD-10-CM | POA: Diagnosis not present

## 2022-07-06 DIAGNOSIS — Z7984 Long term (current) use of oral hypoglycemic drugs: Secondary | ICD-10-CM | POA: Diagnosis not present

## 2022-07-06 DIAGNOSIS — K219 Gastro-esophageal reflux disease without esophagitis: Secondary | ICD-10-CM | POA: Diagnosis not present

## 2022-07-06 DIAGNOSIS — E785 Hyperlipidemia, unspecified: Secondary | ICD-10-CM | POA: Diagnosis not present

## 2022-07-06 DIAGNOSIS — I10 Essential (primary) hypertension: Secondary | ICD-10-CM | POA: Diagnosis not present

## 2022-07-06 DIAGNOSIS — I2510 Atherosclerotic heart disease of native coronary artery without angina pectoris: Secondary | ICD-10-CM | POA: Diagnosis not present

## 2022-07-06 DIAGNOSIS — D751 Secondary polycythemia: Secondary | ICD-10-CM | POA: Diagnosis not present

## 2022-07-07 ENCOUNTER — Telehealth: Payer: Self-pay

## 2022-07-07 DIAGNOSIS — M545 Low back pain, unspecified: Secondary | ICD-10-CM

## 2022-07-07 NOTE — Telephone Encounter (Signed)
-----   Message from Peggyann Shoals sent at 07/07/2022  1:04 PM EDT ----- Regarding: rt appt Contact: [phone] Patient saw neuroscience on 07/03/2022, just like you referred him to. He is still having back pain and they told him to call Dr.Yarbrough and request an appt for recheck and xrays to make sure that the screws and hardware are still in place. Can I schedule a return appt?

## 2022-07-07 NOTE — Telephone Encounter (Signed)
He confirmed appt for 8/17 and is aware that xrays are across the street.

## 2022-07-07 NOTE — Telephone Encounter (Signed)
Dr Darreld Mclean said ok to schedule a return visit Get flex ext xrays prior to appt.

## 2022-07-07 NOTE — Telephone Encounter (Signed)
Pt told Patty his neck does bother him but he can live with it and he would like to be seen for his back pain. He states he had prior surgery with Dr Mauri Pole. Xray orders placed for pt to have prior his appt.

## 2022-07-07 NOTE — Addendum Note (Signed)
Addended by: Berdine Addison on: 07/07/2022 04:11 PM   Modules accepted: Orders

## 2022-07-09 ENCOUNTER — Telehealth: Payer: Self-pay | Admitting: Family Medicine

## 2022-07-09 DIAGNOSIS — G713 Mitochondrial myopathy, not elsewhere classified: Secondary | ICD-10-CM | POA: Diagnosis not present

## 2022-07-09 DIAGNOSIS — Z7951 Long term (current) use of inhaled steroids: Secondary | ICD-10-CM | POA: Diagnosis not present

## 2022-07-09 DIAGNOSIS — G4733 Obstructive sleep apnea (adult) (pediatric): Secondary | ICD-10-CM | POA: Diagnosis not present

## 2022-07-09 DIAGNOSIS — G3184 Mild cognitive impairment, so stated: Secondary | ICD-10-CM | POA: Diagnosis not present

## 2022-07-09 DIAGNOSIS — I10 Essential (primary) hypertension: Secondary | ICD-10-CM | POA: Diagnosis not present

## 2022-07-09 DIAGNOSIS — E1142 Type 2 diabetes mellitus with diabetic polyneuropathy: Secondary | ICD-10-CM | POA: Diagnosis not present

## 2022-07-09 DIAGNOSIS — Z7984 Long term (current) use of oral hypoglycemic drugs: Secondary | ICD-10-CM | POA: Diagnosis not present

## 2022-07-09 DIAGNOSIS — D751 Secondary polycythemia: Secondary | ICD-10-CM | POA: Diagnosis not present

## 2022-07-09 DIAGNOSIS — I2510 Atherosclerotic heart disease of native coronary artery without angina pectoris: Secondary | ICD-10-CM | POA: Diagnosis not present

## 2022-07-09 DIAGNOSIS — K219 Gastro-esophageal reflux disease without esophagitis: Secondary | ICD-10-CM | POA: Diagnosis not present

## 2022-07-09 DIAGNOSIS — M4802 Spinal stenosis, cervical region: Secondary | ICD-10-CM | POA: Diagnosis not present

## 2022-07-09 DIAGNOSIS — Z7985 Long-term (current) use of injectable non-insulin antidiabetic drugs: Secondary | ICD-10-CM | POA: Diagnosis not present

## 2022-07-09 DIAGNOSIS — Z9989 Dependence on other enabling machines and devices: Secondary | ICD-10-CM | POA: Diagnosis not present

## 2022-07-09 DIAGNOSIS — Z7982 Long term (current) use of aspirin: Secondary | ICD-10-CM | POA: Diagnosis not present

## 2022-07-09 DIAGNOSIS — E785 Hyperlipidemia, unspecified: Secondary | ICD-10-CM | POA: Diagnosis not present

## 2022-07-09 DIAGNOSIS — Z9181 History of falling: Secondary | ICD-10-CM | POA: Diagnosis not present

## 2022-07-09 NOTE — Telephone Encounter (Signed)
Home Health Verbal Orders - Caller/Agency: Michelle/ [facility] Number: [phone]  Requesting OT/PT/Skilled Nursing/Social Work/Speech Therapy: OT  Frequency: 2x1w for 1 month    Sharyn Lull Requesting order for bedside commode and tub transfer bench  Sent to: [employer] [phone]

## 2022-07-10 ENCOUNTER — Ambulatory Visit: Payer: Medicaid Other | Admitting: Medical

## 2022-07-13 ENCOUNTER — Other Ambulatory Visit: Payer: Self-pay

## 2022-07-13 DIAGNOSIS — I2510 Atherosclerotic heart disease of native coronary artery without angina pectoris: Secondary | ICD-10-CM | POA: Diagnosis not present

## 2022-07-13 DIAGNOSIS — M4802 Spinal stenosis, cervical region: Secondary | ICD-10-CM | POA: Diagnosis not present

## 2022-07-13 DIAGNOSIS — E1142 Type 2 diabetes mellitus with diabetic polyneuropathy: Secondary | ICD-10-CM | POA: Diagnosis not present

## 2022-07-13 DIAGNOSIS — D751 Secondary polycythemia: Secondary | ICD-10-CM | POA: Diagnosis not present

## 2022-07-13 DIAGNOSIS — Z7985 Long-term (current) use of injectable non-insulin antidiabetic drugs: Secondary | ICD-10-CM | POA: Diagnosis not present

## 2022-07-13 DIAGNOSIS — I10 Essential (primary) hypertension: Secondary | ICD-10-CM | POA: Diagnosis not present

## 2022-07-13 DIAGNOSIS — Z7982 Long term (current) use of aspirin: Secondary | ICD-10-CM | POA: Diagnosis not present

## 2022-07-13 DIAGNOSIS — G4733 Obstructive sleep apnea (adult) (pediatric): Secondary | ICD-10-CM | POA: Diagnosis not present

## 2022-07-13 DIAGNOSIS — G713 Mitochondrial myopathy, not elsewhere classified: Secondary | ICD-10-CM | POA: Diagnosis not present

## 2022-07-13 DIAGNOSIS — E785 Hyperlipidemia, unspecified: Secondary | ICD-10-CM | POA: Diagnosis not present

## 2022-07-13 DIAGNOSIS — K219 Gastro-esophageal reflux disease without esophagitis: Secondary | ICD-10-CM | POA: Diagnosis not present

## 2022-07-13 DIAGNOSIS — Z7984 Long term (current) use of oral hypoglycemic drugs: Secondary | ICD-10-CM | POA: Diagnosis not present

## 2022-07-13 DIAGNOSIS — G3184 Mild cognitive impairment, so stated: Secondary | ICD-10-CM | POA: Diagnosis not present

## 2022-07-13 DIAGNOSIS — Z7951 Long term (current) use of inhaled steroids: Secondary | ICD-10-CM | POA: Diagnosis not present

## 2022-07-13 DIAGNOSIS — Z9989 Dependence on other enabling machines and devices: Secondary | ICD-10-CM | POA: Diagnosis not present

## 2022-07-13 DIAGNOSIS — Z9181 History of falling: Secondary | ICD-10-CM | POA: Diagnosis not present

## 2022-07-13 NOTE — Addendum Note (Signed)
Addended by: Salomon Fick on: 07/13/2022 10:14 AM   Modules accepted: Orders

## 2022-07-13 NOTE — Telephone Encounter (Signed)
Had to create a orders only to be able to put the DME order in for provider but Verbal order has been given to [facility] per Dr.Sowles.

## 2022-07-13 NOTE — Telephone Encounter (Signed)
Called Sharyn Lull back left vm to return my call.

## 2022-07-13 NOTE — Telephone Encounter (Deleted)
Gave VO to [place]  I have Tee'd up DME order requesting per Sharyn Lull for patient. Please edit length of time  and sign if approved.

## 2022-07-20 ENCOUNTER — Encounter: Payer: Self-pay | Admitting: Medical

## 2022-07-20 ENCOUNTER — Ambulatory Visit: Payer: Medicaid Other | Admitting: Medical

## 2022-07-20 VITALS — BP 137/97 | HR 79 | Ht 71.0 in | Wt 213.4 lb

## 2022-07-20 DIAGNOSIS — R0609 Other forms of dyspnea: Secondary | ICD-10-CM | POA: Diagnosis not present

## 2022-07-20 DIAGNOSIS — G4733 Obstructive sleep apnea (adult) (pediatric): Secondary | ICD-10-CM | POA: Diagnosis not present

## 2022-07-20 DIAGNOSIS — I10 Essential (primary) hypertension: Secondary | ICD-10-CM | POA: Diagnosis not present

## 2022-07-20 DIAGNOSIS — R002 Palpitations: Secondary | ICD-10-CM

## 2022-07-20 DIAGNOSIS — I2510 Atherosclerotic heart disease of native coronary artery without angina pectoris: Secondary | ICD-10-CM | POA: Diagnosis not present

## 2022-07-20 DIAGNOSIS — E785 Hyperlipidemia, unspecified: Secondary | ICD-10-CM | POA: Diagnosis not present

## 2022-07-20 NOTE — Progress Notes (Signed)
Cardiology Office Note:    Date:  07/20/2022   ID:  [name] [name], DOB 1961-03-19, MRN [id number]  PCP:  Steele Sizer, MD  [facility] Cardiologist:  [name], MD  [facility] Electrophysiologist:  None   Referring MD: Steele Sizer, MD   Chief Complaint: 2 month follow-up  History of Present Illness:    [name] [name] is a 61 y.o. male with a hx of  with history of nonobstructive CAD, syncope, DM2, HTN, HLD, obesity, mitochondrial myopathy, and OSA on CPAP who presents for follow up of CAD.   LHC in 2016 showed moderate mid LAD stenosis estimated at 50% with an FFR of 0.83, along with a normal LVEF with normal LVEDP.  Due to worsening angina, he underwent repeat LHC in 03/2016 with noted improvement in mid LAD stenosis estimated at 30% with no evidence of obstructive disease.  He had recurrent episodes of loss of consciousness in 3419 of uncertain etiology.  Echo at that time with normal LVEF. 2-week ZIO monitor with no significant arrhytmia.  He was evaluated by neurology with no clear etiology identified.  It was noted, he did have recurrent concussions while growing up playing sports.  He had recurrent angina 07/2017 with stable cath 1-vessel CAD of 30% stenosis of the mid LAD. EF normal with mildly elevated LVEDP.  For worsening exertional dyspnea and intermittent lightheadedness he underwent Lexiscan MPI in 12/2020, that showed no evidence of significant ischemia and CT attenuation images showing no coronary calcification in LAD and RCA.  Echo 02/14/2021 LVEF 60 to 65%, no wall motion, mild LVH, grade 1 diastolic dysfunction, no significant valvular abnormalities.  He was subsequently referred to pulmonology.  PFTs 04/15/2021 with mild obstructive airway disease without significant bronchodilator response.  CPX in 06/2021 as outlined below.  Subsequent R/LHC on 07/30/2021 showed mild nonobstructive CAD involving the LAD with 10% ostial to mid LAD stenosis and mid LAD stenosis  estimated at 30%, with the vessel being mildly calcified. Otherwise, there was no obstructive disease.  RHC showed normal filling pressures, normal pulmonary pressure, and normal cardiac output.  FFR of the LAD lesion of 0.87 and GFR 1.8.  This was not significant enough to require stenting.  IMR was normal at 22 indicating normal microvascular function.  These cardiac findings did not explain the severity of his symptoms.  He has subsequently been evaluated by neurosurgery, oncology, and GI for muscle atrophy, unintentional weight loss, and dysphagia respectively.  Subsequent muscle biopsy concerning for mitochondrial disorder.  He has been referred to [facility] by neurology.  Last seen 01/19/22 and reported palpitations. A heart monitor was ordered. This showed NSR with 1 run SVT lasting 9 beats, rare PVCs and PACs.  Today, the patient reports he has not been doing well. He has mitochondrial myopathy, which has been worsening. He has lost weight, 260>213. He has  been referred to [facility] and has upcoming testing. Muscle function has been slowly getting worse. He is doing at home therapy 3 times weekly and walks with a cane. He has chronic SOB since myopathy started. No chest pain He is wondering is his heart muscles are normal. No LLE, orthopnea, or pnd. He uses a CPAP.   Past Medical History:  Diagnosis Date   Allergy    Angina, class III (HCC)    Chronic constipation    Degeneration of intervertebral disc of cervical region    Diabetes mellitus without complication (HCC)    Diastolic dysfunction    a. 06/2017  Echo: EF 60-65%, no rwma, Gr1 DD.   Dyspnea    GERD (gastroesophageal reflux disease)    Hernia 1991   02/10/2012-RIH repair   Hyperlipidemia    Hypertension 2008   Nerve root pain    Neuropathy    Non-obstructive CAD (coronary artery disease)    a. 05/30/2015 cath: LM nl, mLAD 50% (FFR 0.83), LCx minor irregs, RCA minor irregs, EF 55-65%-->Med Rx; b. 03/2016 Cath: LM nl LAD 28m D1/2/3 min  irregs, LCX min irregs, OM1/2/3 nl, RCA min irregs, RPDA/RPAV/RPL1/RPL2 nl-->Med Rx; b. 07/2020 Cath: LM nl, LAD 376mD1/2/3 min irregs, LCX min irregs, RCA min irregs, RPDA/RPAV/RPL1,2 nl, EF 55-65%.   Obesity, unspecified 2012   Personal history of tobacco use, presenting hazards to health 2012   Polycythemia    Rectal bleeding 02/11/2017   Recurrent Right Inguinal Hernia Repair 02/09/2011, 04/07/2013   [facility] Shortness of breath    Sleep apnea    a. On CPAP;  b. 06/2017 Echo: no PAH.   Umbilical hernia without mention of obstruction or gangrene 02/09/2011   [facility] Vitamin D deficiency     Past Surgical History:  Procedure Laterality Date   BACK SURGERY  12/2008   [facility] CARDIAC CATHETERIZATION N/A 05/30/2015   Procedure: Left Heart Cath;  Surgeon: MuWellington HampshireMD;  [place];  Service: Cardiovascular;  Laterality: N/A;   CARDIAC CATHETERIZATION N/A 04/16/2016   Procedure: Left Heart Cath and Coronary Angiography;  Surgeon: MuWellington HampshireMD;  [place] [facility];  Service: Cardiovascular;  Laterality: N/A;   COLONOSCOPY  2014   Dr. SaJamal Collin COLONOSCOPY WITH PROPOFOL N/A 12/25/2016   Procedure: COLONOSCOPY WITH PROPOFOL;  Surgeon: KiJonathon BellowsMD;  Location: [facility] ENDOSCOPY;  Service: Endoscopy;  Laterality: N/A;   COLONOSCOPY WITH PROPOFOL N/A 01/29/2017   Procedure: COLONOSCOPY WITH PROPOFOL;  Surgeon: KiJonathon BellowsMD;  Location: [facility] ENDOSCOPY;  Service: Endoscopy;  Laterality: N/A;   COLONOSCOPY WITH PROPOFOL N/A 04/11/2020   Procedure: COLONOSCOPY WITH PROPOFOL;  Surgeon: AnJonathon BellowsMD;  [facility];  Service: Gastroenterology;  Laterality: N/A;   ESOPHAGEAL MANOMETRY N/A 03/04/2022   Procedure: ESOPHAGEAL MANOMETRY (EM);  Surgeon: NaMauri PoleMD;  [facility];  Service: Gastroenterology;  Laterality: N/A;   ESOPHAGOGASTRODUODENOSCOPY (EGD) WITH PROPOFOL N/A 01/26/2019   Procedure:  ESOPHAGOGASTRODUODENOSCOPY (EGD) WITH PROPOFOL;  Surgeon: VaLin LandsmanMD;  [facility];  Service: Gastroenterology;  Laterality: N/A;   ESOPHAGOGASTRODUODENOSCOPY (EGD) WITH PROPOFOL N/A 09/10/2021   Procedure: ESOPHAGOGASTRODUODENOSCOPY (EGD) WITH PROPOFOL;  Surgeon: VaLin LandsmanMD;  [facility];  Service: Gastroenterology;  Laterality: N/A;   EVALUATION UNDER ANESTHESIA WITH HEMORRHOIDECTOMY N/A 02/24/2017   Procedure: EXAM UNDER ANESTHESIA WITH POSSIBLE EXCISION OF INTERNAL HEMORRHOIDS;  Surgeon: JoOlean ReeMD;  Location: [facility] ORS;  Service: General;  Laterality: N/A;   FISSURECTOMY  02/24/2017   Procedure: FISSURECTOMY;  Surgeon: JoOlean ReeMD;  Location: [facility] ORS;  Service: General;;   HAND SURGERY Left 2020   HERNIA REPAIR Right 1991   INGUINAL HERNIA REPAIR Right 2012   Dr SaTeryl LucyERNIA REPAIR Right 2014   Dr SaJamal Collin INTRAVASCULAR PRESSURE WIRE/FFR STUDY N/A 07/30/2021   Procedure: INTRAVASCULAR PRESSURE WIRE/FFR STUDY;  Surgeon: ArWellington HampshireMD;  [place];  Service: Cardiovascular;  Laterality: N/A;   LEFT HEART CATH AND CORONARY ANGIOGRAPHY Left 08/05/2020  Procedure: LEFT HEART CATH AND CORONARY ANGIOGRAPHY poss PCI;  Surgeon: Wellington Hampshire, MD;  Location: [place] CV LAB;  Service: Cardiovascular;  Laterality: Left;   MUSCLE BIOPSY Left 12/31/2021   Procedure: LEFT VASTUS LATERALIS BIOPSY;  Surgeon: Meade Maw, MD;  Location: [facility] ORS;  Service: Neurosurgery;  Laterality: Left;   RIGHT/LEFT HEART CATH AND CORONARY ANGIOGRAPHY N/A 07/30/2021   Procedure: RIGHT/LEFT HEART CATH AND CORONARY ANGIOGRAPHY;  Surgeon: Wellington Hampshire, MD;  Location: [place] CV LAB;  Service: Cardiovascular;  Laterality: N/A;    Current Medications: Current Meds  Medication Sig   ACCU-CHEK GUIDE test strip USE TO TEST BLOOD SUGAR THREE TIMES DAILY AS NEEDED   Accu-Chek Softclix Lancets lancets TEST TWICE  DAILY   blood glucose meter kit and supplies 1 each by Other route in the morning and at bedtime. Dispense based on patient and insurance preference. Use up to two  times daily. (FOR ICD-10 E10.9, E11.9).   carvedilol (COREG) 6.25 MG tablet TAKE 1 TABLET(6.25 MG) BY MOUTH TWICE DAILY   ezetimibe (ZETIA) 10 MG tablet Take 1 tablet (10 mg total) by mouth daily.   FARXIGA 10 MG TABS tablet Take 10 mg by mouth daily.   fluticasone (FLONASE) 50 MCG/ACT nasal spray Place 1 spray into both nostrils daily as needed for allergies or rhinitis.   ketoconazole (NIZORAL) 2 % shampoo Apply 1 application topically 3 (three) times a week. Wash scalp/body 3 times a week as needed for alres, let sit 5 minutes before rinsing off, avoid eyes. May use once monthly for maintenance. (Patient taking differently: Apply 1 application  topically every three (3) days as needed (scalp psoriasis.). Wash scalp/body 3 times a week as needed for alres, let sit 5 minutes before rinsing off, avoid eyes. May use once monthly for maintenance.)   losartan-hydrochlorothiazide (HYZAAR) 50-12.5 MG tablet Take 1 tablet by mouth daily. No further refills until seen in office. Thank you!   mupirocin ointment (BACTROBAN) 2 % Apply 1 application topically daily. Qd to excision site (Patient taking differently: Apply 1 application  topically daily as needed (skin irritation.). Qd to excision site)   nitroGLYCERIN (NITROSTAT) 0.4 MG SL tablet Place 1 tablet (0.4 mg total) under the tongue every 5 (five) minutes as needed for chest pain.   omeprazole (PRILOSEC) 40 MG capsule TAKE 1 CAPSULE(40 MG) BY MOUTH TWICE DAILY BEFORE A MEAL   ranolazine (RANEXA) 1000 MG SR tablet Take 1 tablet (1,000 mg total) by mouth 2 (two) times daily.   sildenafil (VIAGRA) 100 MG tablet Take 1 tablet (100 mg total) by mouth daily as needed for erectile dysfunction.   sucralfate (CARAFATE) 1 GM/10ML suspension Take 10 mLs (1 g total) by mouth 4 (four) times daily.    temazepam (RESTORIL) 15 MG capsule Take 1 capsule (15 mg total) by mouth at bedtime.   TRULICITY 7.20 NO/7.0JG SOPN ADMINISTER 0.75 MG UNDER THE SKIN 1 TIME A WEEK     Allergies:   Gabapentin, Latex, and Penicillins   Social History   Socioeconomic History   Marital status: Single    Spouse name: Not on file   Number of children: Not on file   Years of education: Not on file   Highest education level: Not on file  Occupational History   Not on file  Tobacco Use   Smoking status: Former    Types: Cigars    Start date: 12/28/2002    Quit date: 06/04/2005    Years since quitting:  17.1   Smokeless tobacco: Never   Tobacco comments:    quit 2006-smoked 1 cigar occ  Vaping Use   Vaping Use: Never used  Substance and Sexual Activity   Alcohol use: Not Currently    Comment: has not had a drink in 8 months 02/2021   Drug use: No   Sexual activity: Yes    Partners: Female  Other Topics Concern   Not on file  Social History Narrative   Not on file   Social Determinants of Health   Financial Resource Strain: Low Risk  (01/29/2020)   Overall Financial Resource Strain (CARDIA)    Difficulty of Paying Living Expenses: Not hard at all  Food Insecurity: No Food Insecurity (01/29/2020)   Hunger Vital Sign    Worried About Running Out of Food in the Last Year: Never true    [place] in the Last Year: Never true  Transportation Needs: No Transportation Needs (01/29/2020)   PRAPARE - Hydrologist (Medical): No    Lack of Transportation (Non-Medical): No  Physical Activity: Insufficiently Active (01/29/2020)   Exercise Vital Sign    Days of Exercise per Week: 3 days    Minutes of Exercise per Session: 40 min  Stress: No Stress Concern Present (06/12/2019)   [place]    Feeling of Stress : Not at all  Social Connections: Moderately Integrated (06/12/2019)   Social Connection and Isolation Panel  [NHANES]    Frequency of Communication with Friends and Family: More than three times a week    Frequency of Social Gatherings with Friends and Family: More than three times a week    Attends Religious Services: More than 4 times per year    Active Member of [employer] or Organizations: Yes    Attends Music therapist: More than 4 times per year    Marital Status: Divorced     Family History: The patient's family history includes Breast cancer in his cousin; Cancer in his maternal uncle; Heart Problems in his mother; Heart Problems (age of onset: 31) in his father; High Cholesterol in his mother; Hypertension in his mother; Lung cancer in his maternal aunt and maternal aunt; Mental illness in his brother; Other in his maternal aunt; Prostate cancer in his maternal uncle; Testicular cancer in his paternal uncle; Thyroid disease in his daughter.  ROS:   Please see the history of present illness.     All other systems reviewed and are negative.  EKGs/Labs/Other Studies Reviewed:    The following studies were reviewed today:  R/LHC 07/30/2021:   Ost LAD to Mid LAD lesion is 10% stenosed.   Mid LAD lesion is 30% stenosed.   1.  Mild nonobstructive coronary artery disease involving the LAD which is mildly calcified.  No other obstructive disease. 2.  Right heart catheterization showed normal filling pressures, normal pulmonary pressure and normal cardiac output.  3.  FFR evaluation of the LAD showed abnormal flow with an FFR of 0.87 and CFR of 1.8.  Not significant enough to require stenting.  IMR was normal at 22 indicating normal microvascular function.   Recommendations: These cardiac findings do not explain the severity of the patient's symptoms. Recommend continuing medical therapy. __________   CPX 07/01/2021: Attending: This is a puzzling test. The patient's peak VO2 and HR response are severely depressed despite a peak RER suggestive of a maximal effort. That said, the VE/VCO2  slope is moderately elevated but not proportional to the degree of limitation seen in the peak VO2 and argues against a truly severe cardiac limitation. Resting PFTs are normal and do not suggest a pulmonary limitation or vocal cord dysfunction. I am suspicious for possible air leak or metabolic limitation to exercise. Will d/w referring provider. __________   2D echo 02/14/2021: 1. Left ventricular ejection fraction, by estimation, is 60 to 65%. The  left ventricle has normal function. The left ventricle has no regional  wall motion abnormalities. There is mild left ventricular hypertrophy.  Left ventricular diastolic parameters  are consistent with Grade I diastolic dysfunction (impaired relaxation).  The average left ventricular global longitudinal strain is -17.4 %. The  global longitudinal strain is normal.   2. Right ventricular systolic function is normal. The right ventricular  size is normal. Tricuspid regurgitation signal is inadequate for assessing  PA pressure. __________   Carlton Adam MPI 01/24/2021: Pharmacological myocardial perfusion imaging study with no significant  ischemia Normal wall motion, EF estimated at 60% No EKG changes concerning for ischemia at peak stress or in recovery. CT attenuation correction imaging: Coronary calcification noted predominantly in the LAD and RCA no significant aortic atherosclerosis noted low risk scan __________   LHC 08/05/2020: Mid LAD lesion is 30% stenosed. The left ventricular systolic function is normal. LV end diastolic pressure is mildly elevated. The left ventricular ejection fraction is 55-65% by visual estimate.   1.  Mild nonobstructive one-vessel coronary artery disease.  Mid LAD stenosis does not appear to be any worse since most recent catheterization. 2.  Normal LV systolic function and mildly elevated left ventricular end-diastolic pressure.   Recommendations: Continue medical therapy for stable angina which does not seem  to be due to obstructive coronary artery disease. __________   Elwyn Reach patch 02/2018: Normal sinus rhythm. Average heart rate was 74 bpm. Rare PVCs and PACs. Overall, no significant arrhythmia. __________   2D echo 07/22/2017: - Left ventricle: The cavity size was normal. There was moderate    concentric hypertrophy. Systolic function was normal. The    estimated ejection fraction was in the range of 60% to 65%. Wall    motion was normal; there were no regional wall motion    abnormalities. Doppler parameters are consistent with abnormal    left ventricular relaxation (grade 1 diastolic dysfunction). __________   LHC 04/16/2016: Mid LAD lesion, 30% stenosed. The lesion was not previously treated.   1. Mild one-vessel coronary artery disease with 30% stenosis in the mid LAD. The lesion actually appears better than what it looked in June 2016. No evidence of obstructive coronary artery disease. 2. Normal left ventricular end-diastolic pressure. Normal LV systolic function by an invasive imaging.   Recommendations: Continue aggressive medical therapy. It is possible that the patient might have endothelial dysfunction given his anginal symptoms and abnormal stress test. __________   Nuclear stress test 03/16/2016: T wave inversion was noted during stress in the II, III, V5, V6 and V4 leads. Downsloping ST segment depression ST segment depression was noted during stress in the II, III, aVF, V5, V6 and V4 leads. Defect 1: There is a small defect of mild severity present in the mid anterior location. Findings consistent with mild LAD ischemia This is a low risk study. The left ventricular ejection fraction is normal (55-65%). __________   LHC 05/30/2015: Mid LAD lesion, 50% stenosed.   1. Moderate single vessel coronary artery disease involving the mid LAD which was not significant by  pressure wire interrogation. 2. Normal LV systolic function and left ventricular end-diastolic pressure.    Recommendations: Aggressive medical therapy is recommended.  EKG:  EKG is  ordered today.  The ekg ordered today demonstrates NSR 79bpm, nonspecific T wave changes, no significant changes  Recent Labs: 07/21/2021: ALT 30 10/07/2021: TSH 0.90 12/26/2021: BUN 19; Creatinine, Ser 0.80; Hemoglobin 17.8; Platelets 243; Potassium 3.6; Sodium 141  Recent Lipid Panel    Component Value Date/Time   CHOL 129 07/21/2021 0852   CHOL 167 05/08/2016 0803   TRIG 79 07/21/2021 0852   HDL 56 07/21/2021 0852   HDL 52 05/08/2016 0803   CHOLHDL 2.3 07/21/2021 0852   LDLCALC 57 07/21/2021 0852   Physical Exam:    VS:  BP (!) 137/97 (BP Location: Right Arm, Patient Position: Sitting, Cuff Size: Normal)   Pulse 79   Ht '5\' 11"'  (1.803 m)   Wt 213 lb 6.4 oz (96.8 kg)   SpO2 95%   BMI 29.76 kg/m     Wt Readings from Last 3 Encounters:  07/20/22 213 lb 6.4 oz (96.8 kg)  05/08/22 207 lb (93.9 kg)  02/26/22 209 lb (94.8 kg)     GEN:  Well nourished, well developed in no acute distress HEENT: Normal NECK: No JVD; No carotid bruits LYMPHATICS: No lymphadenopathy CARDIAC: RRR, no murmurs, rubs, gallops RESPIRATORY:  Clear to auscultation without rales, wheezing or rhonchi  ABDOMEN: Soft, non-tender, non-distended MUSCULOSKELETAL:  No edema; No deformity  SKIN: Warm and dry NEUROLOGIC:  Alert and oriented x 3 PSYCHIATRIC:  Normal affect   ASSESSMENT:    1. Coronary artery disease, non-occlusive   2. Essential hypertension   3. Hyperlipidemia LDL goal <70   4. Palpitations   5. Dyspnea on exertion   6. OSA (obstructive sleep apnea)    PLAN:    In order of problems listed above:  Nonobstructive CAD by cath 07/2021 Patient denies chest pain. He had chronic SOB, suspected exacerbated by myopathy. No further ischemic work-up at this time. Continue Coreg, Zetia, Ranexa and SL NTG PRN.   Palpitations He denies significant palpitations. Heart monitor showed NSR with 1 run of SVT lasting 9  beats, and rare PVC/PACs. No further work-up.  HTN BP is a little high today, but he says it's better at home. Continue Losartan-HCTZ 50-12.28m daily and Coreg 6.231mID.   HLD LDL 57 06/2021. Continue Zetia.  Dyspnea He has chronic dyspnea that is unchanged, suspected exacerbated by progressive myopathy. Last echo showed normal LVEF with G1DD. We discussed repeat echo, but he would like to wait. He is euvolemic on exam.   OSA He is compliant with CPAP.  Disposition: Follow up in 6 month(s) with MD/APP    Signed, Czarina Gingras H Ninfa MeekerPA-C  07/20/2022 12:08 PM    [facility]

## 2022-07-20 NOTE — Patient Instructions (Signed)
Medication Instructions:  - Your physician recommends that you continue on your current medications as directed. Please refer to the Current Medication list given to you today.  *If you need a refill on your cardiac medications before your next appointment, please call your pharmacy*   Lab Work: - none ordered  If you have labs (blood work) drawn today and your tests are completely normal, you will receive your results only by: [place] (if you have MyChart) OR A paper copy in the mail If you have any lab test that is abnormal or we need to change your treatment, we will call you to review the results.   Testing/Procedures: - none ordered   Follow-Up: At [facility], you and your health needs are our priority.  As part of our continuing mission to provide you with exceptional heart care, we have created designated Provider Care Teams.  These Care Teams include your primary Cardiologist (physician) and Advanced Practice Providers (APPs -  Physician Assistants and Nurse Practitioners) who all work together to provide you with the care you need, when you need it.  We recommend signing up for the patient portal called "MyChart".  Sign up information is provided on this After Visit Summary.  MyChart is used to connect with patients for Virtual Visits (Telemedicine).  Patients are able to view lab/test results, encounter notes, upcoming appointments, etc.  Non-urgent messages can be sent to your provider as well.   To learn more about what you can do with MyChart, go to [email or web address].    Your next appointment:   6 month(s)  The format for your next appointment:   In Person  Provider:   You may see [name], MD or one of the following Advanced Practice Providers on your designated Care Team:    Cadence Kathlen Mody, [place]    Other Instructions N/a  Important Information About Sugar

## 2022-07-24 ENCOUNTER — Other Ambulatory Visit: Payer: Self-pay | Admitting: Family Medicine

## 2022-07-24 DIAGNOSIS — R0602 Shortness of breath: Secondary | ICD-10-CM | POA: Diagnosis not present

## 2022-07-24 DIAGNOSIS — Z9181 History of falling: Secondary | ICD-10-CM | POA: Diagnosis not present

## 2022-07-24 DIAGNOSIS — M4802 Spinal stenosis, cervical region: Secondary | ICD-10-CM | POA: Diagnosis not present

## 2022-07-24 DIAGNOSIS — Z9989 Dependence on other enabling machines and devices: Secondary | ICD-10-CM | POA: Diagnosis not present

## 2022-07-24 DIAGNOSIS — G629 Polyneuropathy, unspecified: Secondary | ICD-10-CM | POA: Diagnosis not present

## 2022-07-24 DIAGNOSIS — Z7985 Long-term (current) use of injectable non-insulin antidiabetic drugs: Secondary | ICD-10-CM | POA: Diagnosis not present

## 2022-07-24 DIAGNOSIS — E1142 Type 2 diabetes mellitus with diabetic polyneuropathy: Secondary | ICD-10-CM | POA: Diagnosis not present

## 2022-07-24 DIAGNOSIS — I10 Essential (primary) hypertension: Secondary | ICD-10-CM

## 2022-07-24 DIAGNOSIS — E785 Hyperlipidemia, unspecified: Secondary | ICD-10-CM | POA: Diagnosis not present

## 2022-07-24 DIAGNOSIS — R531 Weakness: Secondary | ICD-10-CM | POA: Diagnosis not present

## 2022-07-24 DIAGNOSIS — K219 Gastro-esophageal reflux disease without esophagitis: Secondary | ICD-10-CM | POA: Diagnosis not present

## 2022-07-24 DIAGNOSIS — Z7951 Long term (current) use of inhaled steroids: Secondary | ICD-10-CM | POA: Diagnosis not present

## 2022-07-24 DIAGNOSIS — G4733 Obstructive sleep apnea (adult) (pediatric): Secondary | ICD-10-CM | POA: Diagnosis not present

## 2022-07-24 DIAGNOSIS — G3184 Mild cognitive impairment, so stated: Secondary | ICD-10-CM | POA: Diagnosis not present

## 2022-07-24 DIAGNOSIS — Z7984 Long term (current) use of oral hypoglycemic drugs: Secondary | ICD-10-CM | POA: Diagnosis not present

## 2022-07-24 DIAGNOSIS — I2510 Atherosclerotic heart disease of native coronary artery without angina pectoris: Secondary | ICD-10-CM | POA: Diagnosis not present

## 2022-07-24 DIAGNOSIS — G713 Mitochondrial myopathy, not elsewhere classified: Secondary | ICD-10-CM | POA: Diagnosis not present

## 2022-07-24 DIAGNOSIS — D751 Secondary polycythemia: Secondary | ICD-10-CM | POA: Diagnosis not present

## 2022-07-24 DIAGNOSIS — Z7982 Long term (current) use of aspirin: Secondary | ICD-10-CM | POA: Diagnosis not present

## 2022-07-27 ENCOUNTER — Encounter: Payer: Self-pay | Admitting: Dermatology

## 2022-07-27 DIAGNOSIS — R258 Other abnormal involuntary movements: Secondary | ICD-10-CM | POA: Diagnosis not present

## 2022-07-27 DIAGNOSIS — R569 Unspecified convulsions: Secondary | ICD-10-CM | POA: Diagnosis not present

## 2022-07-27 DIAGNOSIS — G729 Myopathy, unspecified: Secondary | ICD-10-CM | POA: Diagnosis not present

## 2022-07-27 DIAGNOSIS — B360 Pityriasis versicolor: Secondary | ICD-10-CM

## 2022-07-27 MED ORDER — KETOCONAZOLE 2 % EX SHAM: 1.0000 | MEDICATED_SHAMPOO | CUTANEOUS | 11 refills | Status: DC

## 2022-07-28 NOTE — Progress Notes (Unsigned)
Name: [name] [name]   MRN: [id number]    DOB: 02/16/1961   Date:07/29/2022       Progress Note  Subjective  Chief Complaint  Follow Up  HPI  DMII: A1C has been well controlled  currently only Trulicity and Farxiga, he tried stopping Trulicity but glucose spikes he is currently taking it once every other week.  He denies polyphagia, polydipsia or polyuria  Mitochondrial myopathy:  going to [facility] and seeing neuromuscular sub-specialist ,he is getting PT three times a week at home, OT ,  he is supposed to be using crutches but he refuses it, currently using a cane. He has neuromuscular atrophy.  He also has dysphagia - takes PPI but may be from muscular dysfunction    A. 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 See comment. - Mild, chronic neurogenic atrophy.    Constipation: he is off Amitiza, taking dulcolax, and is doing okay    HTN/Angina : He is taking carvediolol but not sure if taking losartan hctz half dose . He is on ARB  and Ranexa, off Imdur due to hypotension. Denies recent chest pain or palpitation lately , had recent visit with cardiologist   Cardiac cath done 07/2021     Ost LAD to Mid LAD lesion is 10% stenosed.   Mid LAD lesion is 30% stenosed     SOB: going on since Fall 2021 - he has been evaluated by pulmonologist and cardiologist and no answers yet, cardiac cath unremarkable, also negative  CT chest. He was given Breo for possible asthma, he was advised to follow up with pulmonologist    Hyperlipidemia: compliant with medication, reviewed last labs , LDL at goal it was 92 , he is on Zetia only , off statin therapy due to myopathy . Unchanged    Seizure like activity  : still under the care of Dr. Melrose Nakayama, still has tremors when standing up, had an episode of shaking at night and went back to  main [facility], he had a recently had an MRI and will have  another EEG at [facility]. They adjusted dose of Lyrica that he was taking for neuropathy to help with seizure like activity    Low B12: last level was high, she is taking otc supplementation weekly . Unchanged    OSA: he is wearing CPAP every night,  he states compliant with machine  but not sleeping well due to muscle cramping at night He has Temazepam but only able to sleep for a few hours, we will try switching to Seroquel today    Cervical left radiculopathy: seen by Dr. Jobe Igo and PT advised since his SOB and recent health issues makes him a poor candidate for surgery at this time. Going to see Dr. Cari Caraway soon    Imaging: MRI C spine 08/26/2021 IMPRESSION:  1. Multilevel spondylosis of the cervical spine as described.  2. Moderate foraminal narrowing bilaterally at C2-3, right greater  than left.  3. Moderate right mild left foraminal narrowing at C3-4.  4. Moderate foraminal narrowing bilaterally at C5-6 and C6-7 is  worse on the right.  5. Mild central canal narrowing at C6-7.    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/08/2022   Other insomnia 05/08/2022   Controlled type 2 diabetes mellitus with microalbuminuria, without long-term current use of insulin ([place]) 05/08/2022   Contraindication to statin medication 05/08/2022   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   Tremor 01/22/2022   Shortness of breath 01/22/2022   Elevated CK 11/10/2021   Muscle atrophy of lower extremity 11/10/2021   Pica 11/10/2021   Dysphagia    Asthma 05/14/2021   Multiple thyroid nodules 05/14/2021   Localized, primary  osteoarthritis of hand 09/28/2019   Mild cognitive impairment 11/17/2017   Radiculopathy of cervical region 11/17/2017   Stenosis of carotid artery 09/08/2017   Cervical stenosis of spine 09/08/2017   Headache disorder 02/15/2017   Memory loss or impairment 02/15/2017   Coronary artery disease involving native coronary artery of native heart    Radiculitis of left cervical region 03/05/2016   Diabetic neuropathy associated with type 2 diabetes mellitus ([place]) 12/05/2015   Patellar subluxation 06/05/2015   Allergic rhinitis, seasonal 06/05/2015   Chronic constipation 06/05/2015   Chronic lower back pain 06/05/2015   Vitamin D deficiency 06/05/2015   Central sleep apnea 06/05/2015   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   Nerve root pain 06/05/2015   Acquired polycythemia 06/05/2015   Anterior knee pain 06/05/2015   Failure of erection 06/05/2015   Gastro-esophageal reflux disease without esophagitis 06/05/2015   Neuropathy 06/05/2015   Angina, class III ([place]) 05/23/2015   Essential hypertension 05/23/2015   Hyperlipidemia 05/23/2015   Inguinal hernia without mention of obstruction or gangrene, recurrent unilateral or unspecified 03/24/2013    Past Surgical History:  Procedure Laterality Date   BACK SURGERY  12/2008   X2-LUMBAR   CARDIAC CATHETERIZATION N/A 05/30/2015   Procedure: Left Heart Cath;  Surgeon: Wellington Hampshire, MD;  Location: [place] [facility];  Service: Cardiovascular;  Laterality: N/A;   CARDIAC CATHETERIZATION N/A 04/16/2016   Procedure: Left Heart Cath and Coronary Angiography;  Surgeon: Wellington Hampshire, MD;  Location: [place] [facility];  Service: Cardiovascular;  Laterality: N/A;   COLONOSCOPY  2014   Dr. [name]   COLONOSCOPY WITH PROPOFOL N/A 12/25/2016   Procedure: COLONOSCOPY WITH PROPOFOL;  Surgeon: Jonathon Bellows, MD;  Location: [facility] ENDOSCOPY;  Service: Endoscopy;  Laterality: N/A;   COLONOSCOPY WITH PROPOFOL N/A 01/29/2017   Procedure: COLONOSCOPY WITH PROPOFOL;   Surgeon: Jonathon Bellows, MD;  Location: [facility] ENDOSCOPY;  Service: Endoscopy;  Laterality: N/A;   COLONOSCOPY WITH PROPOFOL N/A 04/11/2020   Procedure: COLONOSCOPY WITH PROPOFOL;  Surgeon: Jonathon Bellows, MD;  Location: [facility] ENDOSCOPY;  Service: Gastroenterology;  Laterality: N/A;   ESOPHAGEAL MANOMETRY N/A 03/04/2022   Procedure: ESOPHAGEAL MANOMETRY (EM);  Surgeon: Mauri Pole, MD;  Location: [facility] ENDOSCOPY;  Service: Gastroenterology;  Laterality: N/A;   ESOPHAGOGASTRODUODENOSCOPY (EGD) WITH PROPOFOL N/A 01/26/2019   Procedure: ESOPHAGOGASTRODUODENOSCOPY (EGD) WITH PROPOFOL;  Surgeon: Lin Landsman, MD;  Location: [facility] ENDOSCOPY;  Service: Gastroenterology;  Laterality: N/A;   ESOPHAGOGASTRODUODENOSCOPY (EGD) WITH PROPOFOL N/A 09/10/2021   Procedure: ESOPHAGOGASTRODUODENOSCOPY (EGD) WITH PROPOFOL;  Surgeon: Lin Landsman, MD;  Location: [facility] ENDOSCOPY;  Service: Gastroenterology;  Laterality: N/A;   EVALUATION UNDER ANESTHESIA WITH HEMORRHOIDECTOMY N/A 02/24/2017   Procedure: EXAM UNDER ANESTHESIA WITH POSSIBLE EXCISION OF INTERNAL HEMORRHOIDS;  Surgeon: Olean Ree, MD;  Location: [facility]  ORS;  Service: General;  Laterality: N/A;   FISSURECTOMY  02/24/2017   Procedure: FISSURECTOMY;  Surgeon: Olean Ree, MD;  Location: [facility] ORS;  Service: General;;   HAND SURGERY Left 2020   HERNIA REPAIR Right 1991   INGUINAL HERNIA REPAIR Right 2012   Dr Teryl Lucy HERNIA REPAIR Right 2014   Dr [name]   INTRAVASCULAR PRESSURE WIRE/FFR STUDY N/A 07/30/2021   Procedure: INTRAVASCULAR PRESSURE WIRE/FFR STUDY;  Surgeon: Wellington Hampshire, MD;  Location: [place] CV LAB;  Service: Cardiovascular;  Laterality: N/A;   LEFT HEART CATH AND CORONARY ANGIOGRAPHY Left 08/05/2020   Procedure: LEFT HEART CATH AND CORONARY ANGIOGRAPHY poss PCI;  Surgeon: Wellington Hampshire, MD;  Location: [place] CV LAB;  Service: Cardiovascular;  Laterality: Left;   MUSCLE BIOPSY Left 12/31/2021   Procedure: LEFT VASTUS  LATERALIS BIOPSY;  Surgeon: Meade Maw, MD;  Location: [facility] ORS;  Service: Neurosurgery;  Laterality: Left;   RIGHT/LEFT HEART CATH AND CORONARY ANGIOGRAPHY N/A 07/30/2021   Procedure: RIGHT/LEFT HEART CATH AND CORONARY ANGIOGRAPHY;  Surgeon: Wellington Hampshire, MD;  Location: [place] CV LAB;  Service: Cardiovascular;  Laterality: N/A;    Family History  Problem Relation Age of Onset   Hypertension Mother    Heart Problems Mother    High Cholesterol Mother    Heart Problems Father 62       myocardial infarction   Mental illness Brother    Other Maternal Aunt        COVID   Lung cancer Maternal Aunt    Lung cancer Maternal Aunt    Cancer Maternal Uncle        unknown   Prostate cancer Maternal Uncle    Testicular cancer Paternal Uncle    Thyroid disease Daughter    Breast cancer Cousin     Social History   Tobacco Use   Smoking status: Former    Types: Cigars    Start date: 12/28/2002    Quit date: 06/04/2005    Years since quitting: 17.1   Smokeless tobacco: Never   Tobacco comments:    quit 2006-smoked 1 cigar occ  Substance Use Topics   Alcohol use: Not Currently    Comment: has not had a drink in 8 months 02/2021     Current Outpatient Medications:    ACCU-CHEK GUIDE test strip, USE TO TEST BLOOD SUGAR THREE TIMES DAILY AS NEEDED, Disp: 100 strip, Rfl: 1   Accu-Chek Softclix Lancets lancets, TEST TWICE DAILY, Disp: 100 each, Rfl: 3   blood glucose meter kit and supplies, 1 each by Other route in the morning and at bedtime. Dispense based on patient and insurance preference. Use up to two  times daily. (FOR ICD-10 E10.9, E11.9)., Disp: 1 each, Rfl: 0   carvedilol (COREG) 6.25 MG tablet, TAKE 1 TABLET(6.25 MG) BY MOUTH TWICE DAILY, Disp: 180 tablet, Rfl: 1   ezetimibe (ZETIA) 10 MG tablet, Take 1 tablet (10 mg total) by mouth daily., Disp: 90 tablet, Rfl: 1   FARXIGA 10 MG TABS tablet, Take 10 mg by mouth daily., Disp: , Rfl:    fluticasone (FLONASE) 50 MCG/ACT  nasal spray, Place 1 spray into both nostrils daily as needed for allergies or rhinitis., Disp: , Rfl:    fluticasone furoate-vilanterol (BREO ELLIPTA) 100-25 MCG/ACT AEPB, Inhale 1 puff into the lungs daily., Disp: , Rfl:    ketoconazole (NIZORAL) 2 % shampoo, Apply 1 Application topically 3 (three) times a week. Wash scalp/body 3  times a week as needed for alres, let sit 5 minutes before rinsing off, avoid eyes. May use once monthly for maintenance., Disp: 120 mL, Rfl: 11   losartan-hydrochlorothiazide (HYZAAR) 50-12.5 MG tablet, Take 1 tablet by mouth daily. No further refills until seen in office. Thank you!, Disp: 15 tablet, Rfl: 0   mupirocin ointment (BACTROBAN) 2 %, Apply 1 application topically daily. Qd to excision site (Patient taking differently: Apply 1 application  topically daily as needed (skin irritation.). Qd to excision site), Disp: 22 g, Rfl: 1   nitroGLYCERIN (NITROSTAT) 0.4 MG SL tablet, Place 1 tablet (0.4 mg total) under the tongue every 5 (five) minutes as needed for chest pain., Disp: , Rfl:    omeprazole (PRILOSEC) 40 MG capsule, TAKE 1 CAPSULE(40 MG) BY MOUTH TWICE DAILY BEFORE A MEAL, Disp: 60 capsule, Rfl: 2   pregabalin (LYRICA) 100 MG capsule, Take 100 mg by mouth 2 (two) times daily., Disp: , Rfl:    QUEtiapine (SEROQUEL) 25 MG tablet, Take 1 tablet (25 mg total) by mouth at bedtime., Disp: 90 tablet, Rfl: 0   ranolazine (RANEXA) 1000 MG SR tablet, Take 1 tablet (1,000 mg total) by mouth 2 (two) times daily., Disp: 180 tablet, Rfl: 3   sildenafil (VIAGRA) 100 MG tablet, Take 1 tablet (100 mg total) by mouth daily as needed for erectile dysfunction., Disp: 30 tablet, Rfl: 1   sucralfate (CARAFATE) 1 GM/10ML suspension, Take 10 mLs (1 g total) by mouth 4 (four) times daily., Disp: 420 mL, Rfl: 1   TRULICITY 3.71 GG/2.6RS SOPN, ADMINISTER 0.75 MG UNDER THE SKIN 1 TIME A WEEK, Disp: 6 mL, Rfl: 0  Allergies  Allergen Reactions   Gabapentin Other (See Comments)     Groggy-Mood Changes   Latex Rash   Penicillins Hives and Rash    Has patient had a PCN reaction causing immediate rash, facial/tongue/throat swelling, SOB or lightheadedness with hypotension: Yes Has patient had a PCN reaction causing severe rash involving mucus membranes or skin necrosis: No Has patient had a PCN reaction that required hospitalization No Has patient had a PCN reaction occurring within the last 10 years: No If all of the above answers are "NO", then may proceed with Cephalosporin use.     I personally reviewed active problem list, medication list, allergies, family history, social history, health maintenance with the patient/caregiver today.   ROS  Constitutional: Negative for fever or weight change.  Respiratory: Negative for cough , positive for intermittent  shortness of breath.   Cardiovascular: Negative for chest pain or palpitations.  Gastrointestinal: Negative for abdominal pain, no bowel changes.  Musculoskeletal: Positive for gait problem but no joint swelling.  Skin: Negative for rash.  Neurological: Negative for dizziness or headache.  No other specific complaints in a complete review of systems (except as listed in HPI above).   Objective  Vitals:   07/29/22 0835  BP: 136/82  Pulse: 98  Resp: 16  SpO2: 99%  Weight: 211 lb (95.7 kg)  Height: _0  (1.803 m)    Body mass index is 29.43 kg/m.  Physical Exam  Constitutional: Patient appears well-developed , malnourished   No distress.  HEENT: head atraumatic, normocephalic, pupils equal and reactive to light, neck supple Cardiovascular: Normal rate, regular rhythm and normal heart sounds.  No murmur heard. No BLE edema. Pulmonary/Chest: Effort normal and breath sounds normal. No respiratory distress. Abdominal: Soft.  There is no tenderness. Psychiatric: Patient has a normal mood and affect. behavior is normal.  Judgment and thought content normal.   Recent Results (from the past 2160 hour(s))   POCT HgB A1C     Status: None   Collection Time: 05/08/22  8:29 AM  Result Value Ref Range   Hemoglobin A1C 5.0 4.0 - 5.6 %   HbA1c POC (<> result, manual entry)     HbA1c, POC (prediabetic range)     HbA1c, POC (controlled diabetic range)    POCT HgB A1C     Status: None   Collection Time: 07/29/22  8:37 AM  Result Value Ref Range   Hemoglobin A1C 5.2 4.0 - 5.6 %   HbA1c POC (<> result, manual entry)     HbA1c, POC (prediabetic range)     HbA1c, POC (controlled diabetic range)        PHQ2/9:    07/29/2022    8:36 AM 05/08/2022    8:29 AM 02/26/2022   10:46 AM 02/24/2022   10:58 AM 02/06/2022    8:05 AM  Depression screen PHQ 2/9  Decreased Interest 0 0 0 0 0  Down, Depressed, Hopeless 0 0 0 0 0  PHQ - 2 Score 0 0 0 0 0  Altered sleeping  0 0 0 0  Tired, decreased energy  0 0 0 0  Change in appetite  0 0 0 0  Feeling bad or failure about yourself   0 0 0 0  Trouble concentrating  0 0 0 0  Moving slowly or fidgety/restless  0 0 0 0  Suicidal thoughts  0 0 0 0  PHQ-9 Score  0 0 0 0  Difficult doing work/chores    Not difficult at all     phq 9 is negative   Fall Risk:    07/29/2022    8:36 AM 05/08/2022    8:29 AM 02/26/2022   10:46 AM 02/24/2022   10:52 AM 02/06/2022    8:04 AM  Fall Risk   Falls in the past year? 0 0 0 1 0  Number falls in past yr: 0 0 0 0 0  Injury with Fall? 0 0 0 0 0  Risk for fall due to : No Fall Risks No Fall Risks No Fall Risks Impaired balance/gait;Impaired mobility No Fall Risks  Follow up Falls prevention discussed Falls prevention discussed Falls prevention discussed  Falls prevention discussed      Functional Status Survey: Is the patient deaf or have difficulty hearing?: No Does the patient have difficulty seeing, even when wearing glasses/contacts?: No Does the patient have difficulty concentrating, remembering, or making decisions?: No Does the patient have difficulty walking or climbing stairs?: Yes Does the patient have  difficulty dressing or bathing?: No Does the patient have difficulty doing errands alone such as visiting a doctor's office or shopping?: No    Assessment & Plan  1. Controlled type 2 diabetes mellitus with microalbuminuria, without long-term current use of insulin ([facility])  - POCT HgB A1C  2. Seizure-like activity ([place])  Getting evaluated by [facility] and is taking higher dose of Lyrica up from 50-100 mg   3. Angina, class III ([place])  Stable under the care of cardiologist   4. 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[place])  Going to [facility], getting PT and OT   5. Hypertension associated with diabetes ([place])  At goal   6. Moderate protein-calorie malnutrition ([facility]CC)  Eating 5 small meals daily ,weight is stable now   7. Neurogenic muscular atrophy   8. Gastro-esophageal reflux disease without esophagitis   9. Essential  hypertension   10. B12 deficiency   11. Cervical radiculitis   12. Contraindication to statin medication   13. Primary insomnia  - QUEtiapine (SEROQUEL) 25 MG tablet; Take 1 tablet (25 mg total) by mouth at bedtime.  Dispense: 90 tablet; Refill: 0

## 2022-07-29 ENCOUNTER — Ambulatory Visit: Payer: Medicaid Other | Admitting: Family Medicine

## 2022-07-29 ENCOUNTER — Encounter: Payer: Self-pay | Admitting: Family Medicine

## 2022-07-29 VITALS — BP 136/82 | HR 98 | Resp 16 | Ht 71.0 in | Wt 211.0 lb

## 2022-07-29 DIAGNOSIS — E1129 Type 2 diabetes mellitus with other diabetic kidney complication: Secondary | ICD-10-CM

## 2022-07-29 DIAGNOSIS — E440 Moderate protein-calorie malnutrition: Secondary | ICD-10-CM | POA: Diagnosis not present

## 2022-07-29 DIAGNOSIS — M5412 Radiculopathy, cervical region: Secondary | ICD-10-CM

## 2022-07-29 DIAGNOSIS — E8840 Mitochondrial metabolism disorder, unspecified: Secondary | ICD-10-CM | POA: Diagnosis not present

## 2022-07-29 DIAGNOSIS — I10 Essential (primary) hypertension: Secondary | ICD-10-CM

## 2022-07-29 DIAGNOSIS — I209 Angina pectoris, unspecified: Secondary | ICD-10-CM | POA: Diagnosis not present

## 2022-07-29 DIAGNOSIS — R569 Unspecified convulsions: Secondary | ICD-10-CM

## 2022-07-29 DIAGNOSIS — K219 Gastro-esophageal reflux disease without esophagitis: Secondary | ICD-10-CM

## 2022-07-29 DIAGNOSIS — Z5309 Procedure and treatment not carried out because of other contraindication: Secondary | ICD-10-CM | POA: Diagnosis not present

## 2022-07-29 DIAGNOSIS — E1159 Type 2 diabetes mellitus with other circulatory complications: Secondary | ICD-10-CM | POA: Diagnosis not present

## 2022-07-29 DIAGNOSIS — M6250 Muscle wasting and atrophy, not elsewhere classified, unspecified site: Secondary | ICD-10-CM

## 2022-07-29 DIAGNOSIS — E538 Deficiency of other specified B group vitamins: Secondary | ICD-10-CM | POA: Diagnosis not present

## 2022-07-29 DIAGNOSIS — R809 Proteinuria, unspecified: Secondary | ICD-10-CM

## 2022-07-29 DIAGNOSIS — I152 Hypertension secondary to endocrine disorders: Secondary | ICD-10-CM

## 2022-07-29 DIAGNOSIS — F5101 Primary insomnia: Secondary | ICD-10-CM

## 2022-07-29 LAB — POCT GLYCOSYLATED HEMOGLOBIN (HGB A1C): Hemoglobin A1C: 5.2 % (ref 4.0–5.6)

## 2022-07-29 MED ORDER — QUETIAPINE FUMARATE 25 MG PO TABS: 25.0000 mg | ORAL_TABLET | Freq: Every day | ORAL | 0 refills | Status: DC

## 2022-07-31 ENCOUNTER — Other Ambulatory Visit: Payer: Self-pay

## 2022-07-31 MED ORDER — LOSARTAN POTASSIUM-HCTZ 50-12.5 MG PO TABS
1.0000 | ORAL_TABLET | Freq: Every day | ORAL | 0 refills | Status: DC
Start: 2022-07-31 — End: 2022-10-05

## 2022-08-04 ENCOUNTER — Telehealth: Payer: Self-pay | Admitting: Family Medicine

## 2022-08-04 NOTE — Telephone Encounter (Signed)
Order faxed to [facility]

## 2022-08-04 NOTE — Telephone Encounter (Signed)
Sharyn Lull w/ [facility] requesting a tub transfer bench for patient   Rep states request was made through epic on 07-09-2022  Please send rx to  [facility] med supply Fax [phone]

## 2022-08-05 ENCOUNTER — Encounter: Payer: Self-pay | Admitting: Family Medicine

## 2022-08-05 ENCOUNTER — Other Ambulatory Visit: Payer: Self-pay | Admitting: Family Medicine

## 2022-08-05 MED ORDER — TEMAZEPAM 15 MG PO CAPS: 15.0000 mg | ORAL_CAPSULE | Freq: Every evening | ORAL | 0 refills | Status: DC | PRN

## 2022-08-11 DIAGNOSIS — R531 Weakness: Secondary | ICD-10-CM | POA: Diagnosis not present

## 2022-08-12 ENCOUNTER — Other Ambulatory Visit: Payer: Self-pay

## 2022-08-12 DIAGNOSIS — G4733 Obstructive sleep apnea (adult) (pediatric): Secondary | ICD-10-CM | POA: Diagnosis not present

## 2022-08-12 DIAGNOSIS — Z9181 History of falling: Secondary | ICD-10-CM | POA: Diagnosis not present

## 2022-08-12 DIAGNOSIS — Z7951 Long term (current) use of inhaled steroids: Secondary | ICD-10-CM | POA: Diagnosis not present

## 2022-08-12 DIAGNOSIS — G713 Mitochondrial myopathy, not elsewhere classified: Secondary | ICD-10-CM | POA: Diagnosis not present

## 2022-08-12 DIAGNOSIS — Z7984 Long term (current) use of oral hypoglycemic drugs: Secondary | ICD-10-CM | POA: Diagnosis not present

## 2022-08-12 DIAGNOSIS — E1142 Type 2 diabetes mellitus with diabetic polyneuropathy: Secondary | ICD-10-CM | POA: Diagnosis not present

## 2022-08-12 DIAGNOSIS — M5416 Radiculopathy, lumbar region: Secondary | ICD-10-CM

## 2022-08-12 DIAGNOSIS — I10 Essential (primary) hypertension: Secondary | ICD-10-CM | POA: Diagnosis not present

## 2022-08-12 DIAGNOSIS — G3184 Mild cognitive impairment, so stated: Secondary | ICD-10-CM | POA: Diagnosis not present

## 2022-08-12 DIAGNOSIS — D751 Secondary polycythemia: Secondary | ICD-10-CM | POA: Diagnosis not present

## 2022-08-12 DIAGNOSIS — Z7982 Long term (current) use of aspirin: Secondary | ICD-10-CM | POA: Diagnosis not present

## 2022-08-12 DIAGNOSIS — Z7985 Long-term (current) use of injectable non-insulin antidiabetic drugs: Secondary | ICD-10-CM | POA: Diagnosis not present

## 2022-08-12 DIAGNOSIS — Z9989 Dependence on other enabling machines and devices: Secondary | ICD-10-CM | POA: Diagnosis not present

## 2022-08-12 DIAGNOSIS — M4802 Spinal stenosis, cervical region: Secondary | ICD-10-CM | POA: Diagnosis not present

## 2022-08-12 DIAGNOSIS — I2510 Atherosclerotic heart disease of native coronary artery without angina pectoris: Secondary | ICD-10-CM | POA: Diagnosis not present

## 2022-08-12 DIAGNOSIS — K219 Gastro-esophageal reflux disease without esophagitis: Secondary | ICD-10-CM | POA: Diagnosis not present

## 2022-08-12 DIAGNOSIS — E785 Hyperlipidemia, unspecified: Secondary | ICD-10-CM | POA: Diagnosis not present

## 2022-08-13 ENCOUNTER — Ambulatory Visit
Admission: RE | Admit: 2022-08-13 | Discharge: 2022-08-13 | Disposition: A | Discharge status: Home / Self Care | Payer: Medicaid Other | Source: Ambulatory Visit | Attending: Neurosurgery | Admitting: Neurosurgery

## 2022-08-13 ENCOUNTER — Encounter: Payer: Self-pay | Admitting: Neurosurgery

## 2022-08-13 ENCOUNTER — Ambulatory Visit
Admission: RE | Admit: 2022-08-13 | Discharge: 2022-08-13 | Disposition: A | Discharge status: Home / Self Care | Payer: Medicaid Other | Attending: Family Medicine | Admitting: Family Medicine

## 2022-08-13 ENCOUNTER — Ambulatory Visit: Payer: Medicaid Other | Admitting: Neurosurgery

## 2022-08-13 VITALS — BP 140/84 | Ht 71.0 in | Wt 211.8 lb

## 2022-08-13 DIAGNOSIS — M545 Low back pain, unspecified: Secondary | ICD-10-CM | POA: Diagnosis not present

## 2022-08-13 DIAGNOSIS — G729 Myopathy, unspecified: Secondary | ICD-10-CM

## 2022-08-13 DIAGNOSIS — G8929 Other chronic pain: Secondary | ICD-10-CM

## 2022-08-13 DIAGNOSIS — M5136 Other intervertebral disc degeneration, lumbar region: Secondary | ICD-10-CM | POA: Diagnosis not present

## 2022-08-13 DIAGNOSIS — M542 Cervicalgia: Secondary | ICD-10-CM | POA: Diagnosis not present

## 2022-08-13 NOTE — Progress Notes (Signed)
HISTORY OF PRESENT ILLNESS: 08/13/2022 [name] continues to have neck and back pain.  He is working with neurology on his mitochondrial disorder.  History of Present Illness: 10/02/2021 [name] is here today with a chief complaint of posterior neck pain that radiates down his left arm to his thumb, index, and middle finger. He also reports numbness and tingling in his entire left arm.  He reports he had a cervical fracture in 1981 from a football injury. He also suffered a left thumb injury in the past.  He has had been having worsening discomfort over the past several years. Is worse in the past 2 years. He reports sharp and aching discomfort with numbness and tingling in his left arm.  More recently, he has had a 60 pound weight loss without intent over the past 6 months. He has severe breathing and exercise limitations over that time. They have not found a cause for this.  Conservative measures:  Physical therapy: has not participated for cervical, participated in PT for lumbar spine in 2008 Multimodal medical therapy including regular antiinflammatories: diclofenac gel, prednisone taper, gabapentin, hydrocodone, ibuprofen, lyrica,  Injections: has had epidural steroid injections - not interested in any more, last time his sugar increased to 700+ 10/02/2019: Left trapezius ridge trigger point injection 11/21/2018: Right trapezius ridge trigger point injection (moderate to good relief) 10/27/2018: Right C4-5 TF ESI (mild relief) 04/11/2018: Left trapezius ridge trigger point injection (good relief x6 months) 03/17/2018: Right C4-5 TF ESI (mild relief times 2 weeks) 02/09/2018: Left C5-6 TF ESI (mild relief times 2 weeks)  Past Surgery: Lumbar fusion x 2 in 2007 by Dr. Reece Packer has no symptoms of cervical myelopathy.  The symptoms are causing a significant impact on the patient's life.   Prior note by [name] Bergeron, MD: INTERVAL HISTORY 09/27/2018:   [name] [name] is a 61 y.o. male who returns today for follow-up. He was last seen on June 27, 2018. He underwent EMG and is here today to review those results. Primarily he complains of numbness in the medial right forearm extending from the elbow to the ulnar 2 digits that is associated with flexion of the right elbow. Symptoms resolve with straightening of the elbow. He also reports headaches and posterior cervical neck pain. He has had long-standing discomfort in the right trapezius as well as right shoulder. He has been working with physical therapy and doing exercises on his own at home. He would like to pursue an additional injection.     HISTORY OF PRESENT ILLNESS 06/27/2018:  [name] [name] is a 61 y.o. male who presents today for neurosurgical evaluation of chronic neck pain.  He reports that he has pain in the posterior portion of the cervical spine which radiates upwards towards his occiput.  This pain can become quite severe.  It is associated with headaches.  He has undergone injections as well as physical therapy without improvement in his posterior neck pain.  The patient reports that he has numbness that involves the entire left upper extremity.  When pressed, the patient says he believes his left thumb is affected the worst however the numbness does affect all aspects of his left upper extremity.  He denies symptoms involving the right upper extremity.  No incontinence.  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  Additionally, the patient reports that he has been having syncopal episodes and  is currently being evaluated for these.  He was told that he also is having "mini strokes." PHYSICAL EXAMINATION:  There were no vitals filed for this visit. General: Patient is well developed, well nourished, calm, collected, and in no apparent distress.  NEUROLOGICAL:  General: In no acute distress.  Awake, alert, oriented to person,  place, and time. Pupils equal round and reactive to light.   Strength:  Side Iliopsoas Quads Hamstring PF DF EHL  R 4 4 4+ '5 5 5  '$ L 4 4 4+ '5 5 5   '$ Incision c/d/i   ROS (Neurologic): Negative except as noted above  IMAGING: Lumbar Flex Ext 08/13/22 shows no abnormal translation.  ASSESSMENT/PLAN:  [name] [name] continues to suffer from his mitochondrial disorder.  He has back pain particular when he walks.  He is also suffering from some neck pain.  At this point, we reviewed that his nerve conduction study showed evidence of a prior radiculopathy but no active radiculopathy.  Additionally, he has no evidence of implant failure and is well fused at L4-5.  Given his underlying disorder, it would be inadvisable to consider elective spinal surgery.  We reviewed pain management versus ongoing exercises and physical therapy.  He will continue with his exercises for now.  We will discuss ongoing pain management with his primary doctor.  I did offer him referral to pain management if necessary.  I will see him back on an as-needed basis.  I spent a total of 15 minutes in face-to-face and non-face-to-face activities related to this patient's care today.   Meade Maw MD, [facility] Department of Neurosurgery

## 2022-08-14 NOTE — Telephone Encounter (Signed)
Referral faxed

## 2022-08-17 ENCOUNTER — Other Ambulatory Visit: Payer: Self-pay | Admitting: Gastroenterology

## 2022-08-18 DIAGNOSIS — E785 Hyperlipidemia, unspecified: Secondary | ICD-10-CM | POA: Diagnosis not present

## 2022-08-18 DIAGNOSIS — G713 Mitochondrial myopathy, not elsewhere classified: Secondary | ICD-10-CM | POA: Diagnosis not present

## 2022-08-18 DIAGNOSIS — Z7984 Long term (current) use of oral hypoglycemic drugs: Secondary | ICD-10-CM | POA: Diagnosis not present

## 2022-08-18 DIAGNOSIS — Z7982 Long term (current) use of aspirin: Secondary | ICD-10-CM | POA: Diagnosis not present

## 2022-08-18 DIAGNOSIS — Z9989 Dependence on other enabling machines and devices: Secondary | ICD-10-CM | POA: Diagnosis not present

## 2022-08-18 DIAGNOSIS — Z9181 History of falling: Secondary | ICD-10-CM | POA: Diagnosis not present

## 2022-08-18 DIAGNOSIS — I2510 Atherosclerotic heart disease of native coronary artery without angina pectoris: Secondary | ICD-10-CM | POA: Diagnosis not present

## 2022-08-18 DIAGNOSIS — K219 Gastro-esophageal reflux disease without esophagitis: Secondary | ICD-10-CM | POA: Diagnosis not present

## 2022-08-18 DIAGNOSIS — I10 Essential (primary) hypertension: Secondary | ICD-10-CM | POA: Diagnosis not present

## 2022-08-18 DIAGNOSIS — E1142 Type 2 diabetes mellitus with diabetic polyneuropathy: Secondary | ICD-10-CM | POA: Diagnosis not present

## 2022-08-18 DIAGNOSIS — D751 Secondary polycythemia: Secondary | ICD-10-CM | POA: Diagnosis not present

## 2022-08-18 DIAGNOSIS — G3184 Mild cognitive impairment, so stated: Secondary | ICD-10-CM | POA: Diagnosis not present

## 2022-08-18 DIAGNOSIS — M4802 Spinal stenosis, cervical region: Secondary | ICD-10-CM | POA: Diagnosis not present

## 2022-08-18 DIAGNOSIS — Z7985 Long-term (current) use of injectable non-insulin antidiabetic drugs: Secondary | ICD-10-CM | POA: Diagnosis not present

## 2022-08-18 DIAGNOSIS — G4733 Obstructive sleep apnea (adult) (pediatric): Secondary | ICD-10-CM | POA: Diagnosis not present

## 2022-08-18 DIAGNOSIS — Z7951 Long term (current) use of inhaled steroids: Secondary | ICD-10-CM | POA: Diagnosis not present

## 2022-08-19 ENCOUNTER — Ambulatory Visit: Payer: Self-pay

## 2022-08-19 NOTE — Telephone Encounter (Signed)
    Chief Complaint: Home health reports pt. Fell at home 08/11/22. No injury. Symptoms: None Frequency: 08/11/22 Pertinent Negatives: Patient denies any injury Disposition: '[]'$ ED /'[]'$ Urgent Care (no appt availability in office) / '[]'$ Appointment(In office/virtual)/ '[]'$  [facility]/ '[]'$ Home Care/ '[]'$ Refused Recommended Disposition /'[]'$ [facility]/ '[x]'$  Follow-up with PCP Additional Notes: Pt. Has completed OT at home. FYI.  Answer Assessment - Initial Assessment Questions 1. MECHANISM: "How did the fall happen?"     Fell in kitchen 2. DOMESTIC VIOLENCE AND ELDER ABUSE SCREENING: "Did you fall because someone pushed you or tried to hurt you?" If Yes, ask: "Are you safe now?"     No 3. ONSET: "When did the fall happen?" (e.g., minutes, hours, or days ago)     08/11/22 4. LOCATION: "What part of the body hit the ground?" (e.g., back, buttocks, head, hips, knees, hands, head, stomach)     Knees 5. INJURY: "Did you hurt (injure) yourself when you fell?" If Yes, ask: "What did you injure? Tell me more about this?" (e.g., body area; type of injury; pain severity)"     No 6. PAIN: "Is there any pain?" If Yes, ask: "How bad is the pain?" (e.g., Scale 1-10; or mild,  moderate, severe)   - NONE (0): No pain   - MILD (1-3): Doesn't interfere with normal activities    - MODERATE (4-7): Interferes with normal activities or awakens from sleep    - SEVERE (8-10): Excruciating pain, unable to do any normal activities      None 7. SIZE: For cuts, bruises, or swelling, ask: "How large is it?" (e.g., inches or centimeters)      N/a 8. PREGNANCY: "Is there any chance you are pregnant?" "When was your last menstrual period?"     N/a 9. OTHER SYMPTOMS: "Do you have any other symptoms?" (e.g., dizziness, fever, weakness; new onset or worsening).      None 10. CAUSE: "What do you think caused the fall (or falling)?" (e.g., tripped, dizzy spell)       Tripped  Protocols used: Falls and  [facility]

## 2022-08-20 ENCOUNTER — Encounter: Payer: Self-pay | Admitting: Neurosurgery

## 2022-08-20 DIAGNOSIS — G713 Mitochondrial myopathy, not elsewhere classified: Secondary | ICD-10-CM | POA: Diagnosis not present

## 2022-08-20 DIAGNOSIS — D751 Secondary polycythemia: Secondary | ICD-10-CM | POA: Diagnosis not present

## 2022-08-20 DIAGNOSIS — Z9181 History of falling: Secondary | ICD-10-CM | POA: Diagnosis not present

## 2022-08-20 DIAGNOSIS — Z9989 Dependence on other enabling machines and devices: Secondary | ICD-10-CM | POA: Diagnosis not present

## 2022-08-20 DIAGNOSIS — E785 Hyperlipidemia, unspecified: Secondary | ICD-10-CM | POA: Diagnosis not present

## 2022-08-20 DIAGNOSIS — Z7985 Long-term (current) use of injectable non-insulin antidiabetic drugs: Secondary | ICD-10-CM | POA: Diagnosis not present

## 2022-08-20 DIAGNOSIS — R0602 Shortness of breath: Secondary | ICD-10-CM | POA: Diagnosis not present

## 2022-08-20 DIAGNOSIS — Z7984 Long term (current) use of oral hypoglycemic drugs: Secondary | ICD-10-CM | POA: Diagnosis not present

## 2022-08-20 DIAGNOSIS — Z7982 Long term (current) use of aspirin: Secondary | ICD-10-CM | POA: Diagnosis not present

## 2022-08-20 DIAGNOSIS — K219 Gastro-esophageal reflux disease without esophagitis: Secondary | ICD-10-CM | POA: Diagnosis not present

## 2022-08-20 DIAGNOSIS — G4733 Obstructive sleep apnea (adult) (pediatric): Secondary | ICD-10-CM | POA: Diagnosis not present

## 2022-08-20 DIAGNOSIS — G629 Polyneuropathy, unspecified: Secondary | ICD-10-CM | POA: Diagnosis not present

## 2022-08-20 DIAGNOSIS — I2510 Atherosclerotic heart disease of native coronary artery without angina pectoris: Secondary | ICD-10-CM | POA: Diagnosis not present

## 2022-08-20 DIAGNOSIS — M4802 Spinal stenosis, cervical region: Secondary | ICD-10-CM | POA: Diagnosis not present

## 2022-08-20 DIAGNOSIS — R531 Weakness: Secondary | ICD-10-CM | POA: Diagnosis not present

## 2022-08-20 DIAGNOSIS — I10 Essential (primary) hypertension: Secondary | ICD-10-CM | POA: Diagnosis not present

## 2022-08-20 DIAGNOSIS — Z7951 Long term (current) use of inhaled steroids: Secondary | ICD-10-CM | POA: Diagnosis not present

## 2022-08-20 DIAGNOSIS — E1142 Type 2 diabetes mellitus with diabetic polyneuropathy: Secondary | ICD-10-CM | POA: Diagnosis not present

## 2022-08-20 DIAGNOSIS — G3184 Mild cognitive impairment, so stated: Secondary | ICD-10-CM | POA: Diagnosis not present

## 2022-08-26 DIAGNOSIS — K219 Gastro-esophageal reflux disease without esophagitis: Secondary | ICD-10-CM | POA: Diagnosis not present

## 2022-08-26 DIAGNOSIS — I2510 Atherosclerotic heart disease of native coronary artery without angina pectoris: Secondary | ICD-10-CM | POA: Diagnosis not present

## 2022-08-26 DIAGNOSIS — Z7982 Long term (current) use of aspirin: Secondary | ICD-10-CM | POA: Diagnosis not present

## 2022-08-26 DIAGNOSIS — Z7984 Long term (current) use of oral hypoglycemic drugs: Secondary | ICD-10-CM | POA: Diagnosis not present

## 2022-08-26 DIAGNOSIS — Z9181 History of falling: Secondary | ICD-10-CM | POA: Diagnosis not present

## 2022-08-26 DIAGNOSIS — E785 Hyperlipidemia, unspecified: Secondary | ICD-10-CM | POA: Diagnosis not present

## 2022-08-26 DIAGNOSIS — I10 Essential (primary) hypertension: Secondary | ICD-10-CM | POA: Diagnosis not present

## 2022-08-26 DIAGNOSIS — Z7951 Long term (current) use of inhaled steroids: Secondary | ICD-10-CM | POA: Diagnosis not present

## 2022-08-26 DIAGNOSIS — G3184 Mild cognitive impairment, so stated: Secondary | ICD-10-CM | POA: Diagnosis not present

## 2022-08-26 DIAGNOSIS — G713 Mitochondrial myopathy, not elsewhere classified: Secondary | ICD-10-CM | POA: Diagnosis not present

## 2022-08-26 DIAGNOSIS — Z7985 Long-term (current) use of injectable non-insulin antidiabetic drugs: Secondary | ICD-10-CM | POA: Diagnosis not present

## 2022-08-26 DIAGNOSIS — E1142 Type 2 diabetes mellitus with diabetic polyneuropathy: Secondary | ICD-10-CM | POA: Diagnosis not present

## 2022-08-26 DIAGNOSIS — D751 Secondary polycythemia: Secondary | ICD-10-CM | POA: Diagnosis not present

## 2022-08-26 DIAGNOSIS — Z9989 Dependence on other enabling machines and devices: Secondary | ICD-10-CM | POA: Diagnosis not present

## 2022-08-26 DIAGNOSIS — G4733 Obstructive sleep apnea (adult) (pediatric): Secondary | ICD-10-CM | POA: Diagnosis not present

## 2022-08-26 DIAGNOSIS — M4802 Spinal stenosis, cervical region: Secondary | ICD-10-CM | POA: Diagnosis not present

## 2022-09-05 ENCOUNTER — Other Ambulatory Visit: Payer: Self-pay | Admitting: Family Medicine

## 2022-09-07 ENCOUNTER — Other Ambulatory Visit: Payer: Self-pay | Admitting: Family Medicine

## 2022-09-07 DIAGNOSIS — E1129 Type 2 diabetes mellitus with other diabetic kidney complication: Secondary | ICD-10-CM

## 2022-09-10 ENCOUNTER — Encounter: Payer: Self-pay | Admitting: Family Medicine

## 2022-09-10 DIAGNOSIS — R531 Weakness: Secondary | ICD-10-CM | POA: Diagnosis not present

## 2022-09-11 ENCOUNTER — Other Ambulatory Visit: Payer: Self-pay | Admitting: Family Medicine

## 2022-09-11 DIAGNOSIS — E538 Deficiency of other specified B group vitamins: Secondary | ICD-10-CM

## 2022-09-15 ENCOUNTER — Encounter: Payer: Self-pay | Admitting: Neurosurgery

## 2022-09-23 ENCOUNTER — Ambulatory Visit: Payer: Medicaid Other

## 2022-09-28 ENCOUNTER — Ambulatory Visit (INDEPENDENT_AMBULATORY_CARE_PROVIDER_SITE_OTHER): Payer: Medicaid Other

## 2022-09-28 DIAGNOSIS — G4733 Obstructive sleep apnea (adult) (pediatric): Secondary | ICD-10-CM | POA: Diagnosis not present

## 2022-09-28 DIAGNOSIS — Z23 Encounter for immunization: Secondary | ICD-10-CM | POA: Diagnosis not present

## 2022-09-28 DIAGNOSIS — M5412 Radiculopathy, cervical region: Secondary | ICD-10-CM | POA: Diagnosis not present

## 2022-09-28 DIAGNOSIS — M1990 Unspecified osteoarthritis, unspecified site: Secondary | ICD-10-CM | POA: Diagnosis not present

## 2022-09-28 DIAGNOSIS — G894 Chronic pain syndrome: Secondary | ICD-10-CM | POA: Diagnosis not present

## 2022-09-28 DIAGNOSIS — M791 Myalgia, unspecified site: Secondary | ICD-10-CM | POA: Diagnosis not present

## 2022-09-28 DIAGNOSIS — M542 Cervicalgia: Secondary | ICD-10-CM | POA: Diagnosis not present

## 2022-09-28 DIAGNOSIS — G8929 Other chronic pain: Secondary | ICD-10-CM | POA: Diagnosis not present

## 2022-09-28 DIAGNOSIS — G4731 Primary central sleep apnea: Secondary | ICD-10-CM | POA: Diagnosis not present

## 2022-09-28 DIAGNOSIS — M5416 Radiculopathy, lumbar region: Secondary | ICD-10-CM | POA: Diagnosis not present

## 2022-09-28 DIAGNOSIS — G729 Myopathy, unspecified: Secondary | ICD-10-CM | POA: Diagnosis not present

## 2022-09-28 DIAGNOSIS — G8921 Chronic pain due to trauma: Secondary | ICD-10-CM | POA: Diagnosis not present

## 2022-09-28 DIAGNOSIS — E538 Deficiency of other specified B group vitamins: Secondary | ICD-10-CM | POA: Diagnosis not present

## 2022-09-28 DIAGNOSIS — M545 Low back pain, unspecified: Secondary | ICD-10-CM | POA: Diagnosis not present

## 2022-09-28 DIAGNOSIS — F419 Anxiety disorder, unspecified: Secondary | ICD-10-CM | POA: Diagnosis not present

## 2022-09-28 DIAGNOSIS — F4542 Pain disorder with related psychological factors: Secondary | ICD-10-CM | POA: Diagnosis not present

## 2022-09-29 ENCOUNTER — Encounter: Payer: Self-pay | Admitting: Family Medicine

## 2022-09-29 LAB — B12 AND FOLATE PANEL
Folate: 9.4 ng/mL
Vitamin B-12: 243 pg/mL (ref 200–1100)

## 2022-09-30 ENCOUNTER — Telehealth: Payer: Self-pay | Admitting: Family Medicine

## 2022-09-30 ENCOUNTER — Other Ambulatory Visit: Payer: Self-pay | Admitting: Family Medicine

## 2022-09-30 ENCOUNTER — Encounter: Payer: Self-pay | Admitting: Family Medicine

## 2022-09-30 ENCOUNTER — Other Ambulatory Visit: Payer: Self-pay

## 2022-09-30 NOTE — Telephone Encounter (Signed)
Duplicate request. Requested Prescriptions  Pending Prescriptions Disp Refills  . fluticasone furoate-vilanterol (BREO ELLIPTA) 100-25 MCG/ACT AEPB [Pharmacy Med Name: FLUTIC/VILAN 100-25MCG ORAL INH(30)] 180 each     Sig: INHALE 1 PUFF INTO THE LUNGS DAILY     Pulmonology:  Combination Products Passed - 09/30/2022  2:03 PM      Passed - Valid encounter within last 12 months    Recent Outpatient Visits          2 months ago Controlled type 2 diabetes mellitus with microalbuminuria, without long-term current use of insulin [facility])   [facility], Drue Stager, MD   4 months ago Controlled type 2 diabetes mellitus with microalbuminuria, without long-term current use of insulin [facility])   [facility] Steele Sizer, MD   7 months ago [facility] Steele Sizer, MD   7 months ago Controlled type 2 diabetes mellitus with microalbuminuria, without long-term current use of insulin [facility])   [facility] Steele Sizer, MD   11 months ago B12 deficiency   [facility] Steele Sizer, MD      Future Appointments            In 1 month Ancil Boozer, Drue Stager, MD [facility], [facility]

## 2022-10-03 ENCOUNTER — Other Ambulatory Visit: Payer: Self-pay | Admitting: Medical

## 2022-10-03 ENCOUNTER — Other Ambulatory Visit: Payer: Self-pay | Admitting: Gastroenterology

## 2022-10-03 DIAGNOSIS — R634 Abnormal weight loss: Secondary | ICD-10-CM

## 2022-10-03 DIAGNOSIS — R1319 Other dysphagia: Secondary | ICD-10-CM

## 2022-10-05 ENCOUNTER — Encounter: Payer: Self-pay | Admitting: Family Medicine

## 2022-10-05 DIAGNOSIS — M4802 Spinal stenosis, cervical region: Secondary | ICD-10-CM | POA: Diagnosis not present

## 2022-10-05 DIAGNOSIS — R531 Weakness: Secondary | ICD-10-CM | POA: Diagnosis not present

## 2022-10-05 DIAGNOSIS — M47812 Spondylosis without myelopathy or radiculopathy, cervical region: Secondary | ICD-10-CM | POA: Diagnosis not present

## 2022-10-05 DIAGNOSIS — M5031 Other cervical disc degeneration,  high cervical region: Secondary | ICD-10-CM | POA: Diagnosis not present

## 2022-10-05 DIAGNOSIS — M50223 Other cervical disc displacement at C6-C7 level: Secondary | ICD-10-CM | POA: Diagnosis not present

## 2022-10-07 ENCOUNTER — Other Ambulatory Visit: Payer: Self-pay | Admitting: Family Medicine

## 2022-10-12 DIAGNOSIS — E8840 Mitochondrial metabolism disorder, unspecified: Secondary | ICD-10-CM | POA: Diagnosis not present

## 2022-10-12 DIAGNOSIS — R531 Weakness: Secondary | ICD-10-CM | POA: Diagnosis not present

## 2022-10-12 DIAGNOSIS — R569 Unspecified convulsions: Secondary | ICD-10-CM | POA: Diagnosis not present

## 2022-10-12 DIAGNOSIS — Z7183 Encounter for nonprocreative genetic counseling: Secondary | ICD-10-CM | POA: Diagnosis not present

## 2022-10-12 DIAGNOSIS — Z1371 Encounter for nonprocreative screening for genetic disease carrier status: Secondary | ICD-10-CM | POA: Diagnosis not present

## 2022-10-12 DIAGNOSIS — I10 Essential (primary) hypertension: Secondary | ICD-10-CM | POA: Diagnosis not present

## 2022-10-12 DIAGNOSIS — G713 Mitochondrial myopathy, not elsewhere classified: Secondary | ICD-10-CM | POA: Diagnosis not present

## 2022-10-13 ENCOUNTER — Encounter: Payer: Self-pay | Admitting: Cardiovascular Disease

## 2022-10-13 ENCOUNTER — Other Ambulatory Visit: Payer: Self-pay | Admitting: Family Medicine

## 2022-10-13 ENCOUNTER — Other Ambulatory Visit: Payer: Self-pay | Admitting: Internal Medicine

## 2022-10-13 NOTE — Telephone Encounter (Signed)
Requested medication (s) are due for refill today: yes  Requested medication (s) are on the active medication list: yes  Last refill:  07/07/22  Future visit scheduled: yes  Notes to clinic:  Unable to refill per protocol, last refill by another provider. Historical provider     Requested Prescriptions  Pending Prescriptions Disp Refills   FARXIGA 10 MG TABS tablet [Pharmacy Med Name: FARXIGA 10MG TABLETS] 90 tablet     Sig: TAKE 1 TABLET(10 MG) BY MOUTH DAILY BEFORE BREAKFAST     Endocrinology:  Diabetes - SGLT2 Inhibitors Passed - 10/13/2022 10:25 AM      Passed - Cr in normal range and within 360 days    Creat  Date Value Ref Range Status  07/21/2021 0.85 0.70 - 1.30 mg/dL Final   Creatinine, Ser  Date Value Ref Range Status  12/26/2021 0.80 0.61 - 1.24 mg/dL Final   Creatinine, Urine  Date Value Ref Range Status  07/22/2021 57 20 - 320 mg/dL Final         Passed - HBA1C is between 0 and 7.9 and within 180 days    Hemoglobin A1C  Date Value Ref Range Status  07/29/2022 5.2 4.0 - 5.6 % Final   HbA1c, POC (controlled diabetic range)  Date Value Ref Range Status  01/29/2020 6.7 0.0 - 7.0 % Final   Hgb A1c MFr Bld  Date Value Ref Range Status  02/06/2022 5.5 <5.7 % of total Hgb Final    Comment:    For the purpose of screening for the presence of diabetes: . <5.7%       Consistent with the absence of diabetes 5.7-6.4%    Consistent with increased risk for diabetes             (prediabetes) > or =6.5%  Consistent with diabetes . This assay result is consistent with a decreased risk of diabetes. . Currently, no consensus exists regarding use of hemoglobin A1c for diagnosis of diabetes in children. . According to American Diabetes Association (ADA) guidelines, hemoglobin A1c <7.0% represents optimal control in non-pregnant diabetic patients. Different metrics may apply to specific patient populations.  Standards of Medical Care in Diabetes(ADA). .           Passed - eGFR in normal range and within 360 days    GFR, Est African American  Date Value Ref Range Status  09/26/2020 106 > OR = 60 mL/min/1.73m2 Final   GFR, Est Non African American  Date Value Ref Range Status  09/26/2020 91 > OR = 60 mL/min/1.73m2 Final   GFR, Estimated  Date Value Ref Range Status  12/26/2021 >60 >60 mL/min Final    Comment:    (NOTE) Calculated using the CKD-EPI Creatinine Equation (2021)    eGFR  Date Value Ref Range Status  07/21/2021 101 > OR = 60 mL/min/1.73m2 Final    Comment:    The eGFR is based on the CKD-EPI 2021 equation. To calculate  the new eGFR from a previous Creatinine or Cystatin C result, go to https://www.kidney.org/professionals/ kdoqi/gfr%5Fcalculator          Passed - Valid encounter within last 6 months    Recent Outpatient Visits           2 months ago Controlled type 2 diabetes mellitus with microalbuminuria, without long-term current use of insulin (HCC)   [facility], Krichna, MD   5 months ago Controlled type 2 diabetes mellitus with microalbuminuria, without long-term current use of insulin (  [facility])   [facility] Steele Sizer, MD   7 months ago [facility] Steele Sizer, MD   8 months ago Controlled type 2 diabetes mellitus with microalbuminuria, without long-term current use of insulin [facility])   [facility] Steele Sizer, MD   1 year ago B12 deficiency   [facility] Steele Sizer, MD       Future Appointments             In 3 weeks Steele Sizer, MD [facility], [facility]

## 2022-10-14 ENCOUNTER — Encounter: Payer: Self-pay | Admitting: Family Medicine

## 2022-10-14 ENCOUNTER — Ambulatory Visit: Payer: Medicaid Other | Admitting: Pulmonary Disease

## 2022-10-14 ENCOUNTER — Other Ambulatory Visit: Payer: Self-pay | Admitting: *Deleted

## 2022-10-14 MED ORDER — FARXIGA 10 MG PO TABS: 10.0000 mg | ORAL_TABLET | Freq: Every day | ORAL | 3 refills | Status: DC

## 2022-10-14 NOTE — Telephone Encounter (Signed)
Please advise if ok to refill Farxiga 10 mg qd. I see where medication was stopped at one point. Please advise I ok to refill.

## 2022-10-20 ENCOUNTER — Ambulatory Visit (INDEPENDENT_AMBULATORY_CARE_PROVIDER_SITE_OTHER): Payer: Medicaid Other

## 2022-10-20 DIAGNOSIS — E8840 Mitochondrial metabolism disorder, unspecified: Secondary | ICD-10-CM | POA: Diagnosis not present

## 2022-10-20 DIAGNOSIS — E538 Deficiency of other specified B group vitamins: Secondary | ICD-10-CM | POA: Diagnosis not present

## 2022-10-20 DIAGNOSIS — I10 Essential (primary) hypertension: Secondary | ICD-10-CM | POA: Diagnosis not present

## 2022-10-20 MED ORDER — CYANOCOBALAMIN 1000 MCG/ML IJ SOLN
1000.0000 ug | Freq: Once | INTRAMUSCULAR | Status: AC
  Administered 2022-10-20: 1000 ug via INTRAMUSCULAR

## 2022-10-27 DIAGNOSIS — E8840 Mitochondrial metabolism disorder, unspecified: Secondary | ICD-10-CM | POA: Diagnosis not present

## 2022-10-27 DIAGNOSIS — R5382 Chronic fatigue, unspecified: Secondary | ICD-10-CM | POA: Diagnosis not present

## 2022-10-27 DIAGNOSIS — M6281 Muscle weakness (generalized): Secondary | ICD-10-CM | POA: Diagnosis not present

## 2022-10-27 DIAGNOSIS — G737 Myopathy in diseases classified elsewhere: Secondary | ICD-10-CM | POA: Diagnosis not present

## 2022-10-31 DIAGNOSIS — R569 Unspecified convulsions: Secondary | ICD-10-CM | POA: Diagnosis not present

## 2022-11-03 ENCOUNTER — Other Ambulatory Visit: Payer: Self-pay | Admitting: Family Medicine

## 2022-11-03 NOTE — Progress Notes (Unsigned)
Name: [name] [name]   MRN: [id number]    DOB: 1961/06/16   Date:11/04/2022       Progress Note  Subjective  Chief Complaint  Follow Up  HPI  DMII: A1C has been well controlled  currently only Trulicity and Farxiga, he tried stopping Trulicity but glucose spikes he is currently taking it once every other week.  He denies polyphagia, polydipsia or polyuria. He is due for lipid and urine micro, A1C is at goal, denies hypoglycemic episodes. He has associated dyslipidemia, CAD, HTN  Mitochondrial disease   going to [facility] and seeing neuromuscular sub-specialist , he is still using a cane, does not like using crutches . He has neuromuscular atrophy.  He also has dysphagia He has seen a geneticist    A. 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 See comment. - Mild, chronic neurogenic atrophy.    Constipation:he has been taking dulcolax and milk of magnesia prn, he states Amitiza did not work , discussed miralax daily    HTN/Angina : He is taking carvedilol and Ranexa, off Imdur due to hypotension. Denies recent chest pain or palpitation lately , he is under the care of cardiologist   Cardiac cath done 07/2021     Ost LAD to Mid LAD lesion is 10% stenosed.   Mid LAD lesion is 30% stenosed     SOB: going on since Fall 2021 - he has been evaluated by pulmonologist and cardiologist and no answers yet, cardiac cath unremarkable, also negative  CT chest. He was given Memory Dance initially without help, he was told secondary to mitochondrial disease    Hyperlipidemia: compliant with medication, LDL at goal it was 68 , off statin and zetia due to mitochondrial disease  Seizure like activity  : still under the care of Dr. Melrose Nakayama, still has tremors when standing up, had an episode of shaking at night and went back to  main [facility], he had a recently had an MRI and will have another EEG at [facility].  He is of lyrica and likely secondary to mitochondrial disease    Low B12: he is getting B12 injections  monthly    OSA: he is wearing CPAP every night,  he states compliant with machine  but not sleeping well due to muscle cramping at night He has Temazepam     Cervical left radiculopathy: seen by Dr. Jobe Igo and PT advised since his SOB and recent health issues makes him a poor candidate for surgery at this time.He saw Dr. Markus Jarvis and was referred to pain medicine - Dr. Holley Raring    Imaging: MRI C spine 08/26/2021 IMPRESSION:  1. Multilevel spondylosis of the cervical spine as described.  2. Moderate foraminal narrowing bilaterally at C2-3, right greater  than left.  3. Moderate right mild left foraminal narrowing at C3-4.  4. Moderate foraminal narrowing bilaterally at C5-6 and C6-7 is  worse on the right.  5. Mild central canal narrowing at C6-7.    Malnutrition: BMI is now down to 30, he has lost about 50 lbs, his baseline weight was 262 lbs, he is trying to increase calorie intake and eating 5 times a day ,weight went up since last visit    Enlarged thyroid: CT done 03/[facility] at [facility], showed goiter and compression of trachea , TSH has been at goal . 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   Tremor 01/22/2022   Shortness of breath 01/22/2022   Elevated CK 11/10/2021   Muscle atrophy of lower extremity 11/10/2021   Pica 11/10/2021   Dysphagia    Asthma 05/14/2021   Multiple thyroid nodules 05/14/2021   Localized, primary osteoarthritis of hand 09/28/2019   Mild cognitive impairment 11/17/2017   Radiculopathy of cervical region 11/17/2017   Stenosis of carotid artery 09/08/2017    Cervical stenosis of spine 09/08/2017   Headache disorder 02/15/2017   Memory loss or impairment 02/15/2017   Coronary artery disease involving native coronary artery of native heart    Radiculitis of left cervical region 03/05/2016   Diabetic neuropathy associated with type 2 diabetes mellitus ([place]) 12/05/2015   Patellar subluxation 06/05/2015   Allergic rhinitis, seasonal 06/05/2015   Chronic constipation 06/05/2015   Chronic lower back pain 06/05/2015   Vitamin D deficiency 06/05/2015   Central sleep apnea 06/05/2015   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   Nerve root pain 06/05/2015   Acquired polycythemia 06/05/2015   Anterior knee pain 06/05/2015   Failure of erection 06/05/2015   Gastro-esophageal reflux disease without esophagitis 06/05/2015   Neuropathy 06/05/2015   Angina, class III ([place]) 05/23/2015   Essential hypertension 05/23/2015   Hyperlipidemia 05/23/2015   Inguinal hernia without mention of obstruction or gangrene, recurrent unilateral or unspecified 03/24/2013    Past Surgical History:  Procedure Laterality Date   BACK SURGERY  12/2008   X2-LUMBAR   CARDIAC CATHETERIZATION N/A 05/30/2015   Procedure: Left Heart Cath;  Surgeon: Wellington Hampshire, MD;  Location: [place] CV LAB;  Service: Cardiovascular;  Laterality: N/A;   CARDIAC CATHETERIZATION N/A 04/16/2016   Procedure: Left Heart Cath and Coronary Angiography;  Surgeon: Wellington Hampshire, MD;  Location: [place] CV LAB;  Service: Cardiovascular;  Laterality: N/A;   COLONOSCOPY  2014   Dr. [name]   COLONOSCOPY WITH PROPOFOL N/A 12/25/2016   Procedure: COLONOSCOPY WITH PROPOFOL;  Surgeon: Jonathon Bellows, MD;  Location: [facility] ENDOSCOPY;  Service: Endoscopy;  Laterality: N/A;   COLONOSCOPY WITH PROPOFOL N/A 01/29/2017   Procedure: COLONOSCOPY WITH PROPOFOL;  Surgeon: Jonathon Bellows, MD;  Location: [facility] ENDOSCOPY;  Service: Endoscopy;  Laterality: N/A;   COLONOSCOPY WITH PROPOFOL N/A 04/11/2020   Procedure: COLONOSCOPY WITH  PROPOFOL;  Surgeon: Jonathon Bellows, MD;  Location[facility] ENDOSCOPY;  Service: Gastroenterology;  Laterality: N/A;   ESOPHAGEAL MANOMETRY N/A 03/04/2022   Procedure: ESOPHAGEAL MANOMETRY (EM);  Surgeon: Mauri Pole, MD;  Location: [facility] ENDOSCOPY;  Service: Gastroenterology;  Laterality: N/A;   ESOPHAGOGASTRODUODENOSCOPY (EGD) WITH PROPOFOL N/A 01/26/2019   Procedure: ESOPHAGOGASTRODUODENOSCOPY (EGD) WITH PROPOFOL;  Surgeon: Lin Landsman, MD;  Location[facility] ENDOSCOPY;  Service: Gastroenterology;  Laterality: N/A;   ESOPHAGOGASTRODUODENOSCOPY (EGD) WITH PROPOFOL N/A 09/10/2021   Procedure: ESOPHAGOGASTRODUODENOSCOPY (EGD) WITH PROPOFOL;  Surgeon: Lin Landsman, MD;  Location: [facility] ENDOSCOPY;  Service: Gastroenterology;  Laterality: N/A;   EVALUATION UNDER ANESTHESIA WITH HEMORRHOIDECTOMY N/A 02/24/2017   Procedure: EXAM UNDER ANESTHESIA WITH POSSIBLE EXCISION OF INTERNAL HEMORRHOIDS;  Surgeon: Olean Ree, MD;  Location: [facility] ORS;  Service: General;  Laterality: N/A;   FISSURECTOMY  02/24/2017   Procedure: FISSURECTOMY;  Surgeon: Olean Ree, MD;  Location: [facility] ORS;  Service: General;;   HAND SURGERY Left 2020   HERNIA REPAIR Right 1991  INGUINAL HERNIA REPAIR Right 2012   Dr Teryl Lucy HERNIA REPAIR Right 2014   Dr [name]   INTRAVASCULAR PRESSURE WIRE/FFR STUDY N/A 07/30/2021   Procedure: INTRAVASCULAR PRESSURE WIRE/FFR STUDY;  Surgeon: Wellington Hampshire, MD;  Location: [place] CV LAB;  Service: Cardiovascular;  Laterality: N/A;   LEFT HEART CATH AND CORONARY ANGIOGRAPHY Left 08/05/2020   Procedure: LEFT HEART CATH AND CORONARY ANGIOGRAPHY poss PCI;  Surgeon: Wellington Hampshire, MD;  Location: [place] CV LAB;  Service: Cardiovascular;  Laterality: Left;   MUSCLE BIOPSY Left 12/31/2021   Procedure: LEFT VASTUS LATERALIS BIOPSY;  Surgeon: Meade Maw, MD;  Location: [facility] ORS;  Service: Neurosurgery;  Laterality: Left;   RIGHT/LEFT HEART CATH AND CORONARY ANGIOGRAPHY  N/A 07/30/2021   Procedure: RIGHT/LEFT HEART CATH AND CORONARY ANGIOGRAPHY;  Surgeon: Wellington Hampshire, MD;  Location: [place] CV LAB;  Service: Cardiovascular;  Laterality: N/A;    Family History  Problem Relation Age of Onset   Hypertension Mother    Heart Problems Mother    High Cholesterol Mother    Heart Problems Father 23       myocardial infarction   Mental illness Brother    Other Maternal Aunt        COVID   Lung cancer Maternal Aunt    Lung cancer Maternal Aunt    Cancer Maternal Uncle        unknown   Prostate cancer Maternal Uncle    Testicular cancer Paternal Uncle    Thyroid disease Daughter    Breast cancer Cousin     Social History   Tobacco Use   Smoking status: Former    Types: Cigars    Start date: 12/28/2002    Quit date: 06/04/2005    Years since quitting: 17.4   Smokeless tobacco: Never   Tobacco comments:    quit 2006-smoked 1 cigar occ  Substance Use Topics   Alcohol use: Not Currently    Comment: has not had a drink in 8 months 02/2021     Current Outpatient Medications:    ACCU-CHEK GUIDE test strip, USE TO TEST BLOOD SUGAR THREE TIMES DAILY AS NEEDED, Disp: 100 strip, Rfl: 1   Accu-Chek Softclix Lancets lancets, TEST TWICE DAILY, Disp: 100 each, Rfl: 3   Alpha Lipoic Acid 200 MG CAPS, Take 200 mg by mouth daily., Disp: , Rfl:    blood glucose meter kit and supplies, 1 each by Other route in the morning and at bedtime. Dispense based on patient and insurance preference. Use up to two  times daily. (FOR ICD-10 E10.9, E11.9)., Disp: 1 each, Rfl: 0   carvedilol (COREG) 6.25 MG tablet, TAKE 1 TABLET(6.25 MG) BY MOUTH TWICE DAILY, Disp: 180 tablet, Rfl: 1   Coenzyme Q10 (COQ-10) 100 MG CAPS, Take 100 mg by mouth daily., Disp: , Rfl:    FARXIGA 10 MG TABS tablet, Take 1 tablet (10 mg total) by mouth daily., Disp: 30 tablet, Rfl: 3   fluticasone (FLONASE) 50 MCG/ACT nasal spray, Place 1 spray into both nostrils daily as needed for allergies or  rhinitis., Disp: , Rfl:    fluticasone furoate-vilanterol (BREO ELLIPTA) 100-25 MCG/ACT AEPB, INHALE 1 PUFF INTO THE LUNGS DAILY, Disp: 60 each, Rfl: 0   ketoconazole (NIZORAL) 2 % shampoo, Apply 1 Application topically 3 (three) times a week. Wash scalp/body 3 times a week as needed for alres, let sit 5 minutes before rinsing off, avoid eyes. May use once monthly for maintenance.,  Disp: 120 mL, Rfl: 11   losartan-hydrochlorothiazide (HYZAAR) 50-12.5 MG tablet, TAKE 1 TABLET BY MOUTH DAILY, Disp: 90 tablet, Rfl: 2   mupirocin ointment (BACTROBAN) 2 %, Apply 1 application topically daily. Qd to excision site (Patient taking differently: Apply 1 application  topically daily as needed (skin irritation.). Qd to excision site), Disp: 22 g, Rfl: 1   nitroGLYCERIN (NITROSTAT) 0.4 MG SL tablet, Place 1 tablet (0.4 mg total) under the tongue every 5 (five) minutes as needed for chest pain., Disp: , Rfl:    omeprazole (PRILOSEC) 40 MG capsule, TAKE 1 CAPSULE(40 MG) BY MOUTH TWICE DAILY BEFORE A MEAL, Disp: 60 capsule, Rfl: 5   ranolazine (RANEXA) 1000 MG SR tablet, Take 1 tablet (1,000 mg total) by mouth 2 (two) times daily., Disp: 180 tablet, Rfl: 3   Riboflavin (B-2) 100 MG TABS, Take 100 mg by mouth daily., Disp: , Rfl:    sildenafil (VIAGRA) 100 MG tablet, Take 1 tablet (100 mg total) by mouth daily as needed for erectile dysfunction., Disp: 30 tablet, Rfl: 1   sucralfate (CARAFATE) 1 GM/10ML suspension, SHAKE LIQUID AND TAKE 10 ML(1 GRAM) BY MOUTH FOUR TIMES DAILY, Disp: 420 mL, Rfl: 1   temazepam (RESTORIL) 15 MG capsule, Take 1 capsule (15 mg total) by mouth at bedtime as needed for sleep., Disp: 90 capsule, Rfl: 0   TRULICITY 2.02 RK/2.7CW SOPN, ADMINISTER 0.75 MG UNDER THE SKIN 1 TIME A WEEK, Disp: 6 mL, Rfl: 0   ezetimibe (ZETIA) 10 MG tablet, Take 1 tablet (10 mg total) by mouth daily., Disp: 90 tablet, Rfl: 1   pregabalin (LYRICA) 100 MG capsule, Take 100 mg by mouth 2 (two) times daily., Disp: ,  Rfl:   Allergies  Allergen Reactions   Gabapentin Other (See Comments)    Groggy-Mood Changes   Latex Rash   Penicillins Hives and Rash    Has patient had a PCN reaction causing immediate rash, facial/tongue/throat swelling, SOB or lightheadedness with hypotension: Yes Has patient had a PCN reaction causing severe rash involving mucus membranes or skin necrosis: No Has patient had a PCN reaction that required hospitalization No Has patient had a PCN reaction occurring within the last 10 years: No If all of the above answers are "NO", then may proceed with Cephalosporin use.     I personally reviewed active problem list, medication list, allergies, family history, social history, health maintenance with the patient/caregiver today.   ROS  Ten systems reviewed and is negative except as mentioned in HPI   Objective  Vitals:   11/04/22 1018  BP: 126/68  Pulse: 74  Resp: 16  SpO2: 95%  Weight: 222 lb (100.7 kg)  Height: _0  (1.803 m)    Body mass index is 30.96 kg/m.  Physical Exam  Constitutional: Patient appears well-developed , using a cane, muscle atrophy  No distress.  HEENT: head atraumatic, normocephalic, pupils equal and reactive to light, neck supple Cardiovascular: Normal rate, regular rhythm and normal heart sounds.  No murmur heard. No BLE edema. Pulmonary/Chest: Effort normal and breath sounds normal. No respiratory distress. Abdominal: Soft.  There is no tenderness. Muscular skeletal: using a cane, atrophy noticed  Psychiatric: Patient has a normal mood and affect. behavior is normal. Judgment and thought content normal.   Recent Results (from the past 2160 hour(s))  B12 and Folate Panel     Status: None   Collection Time: 09/28/22 11:18 AM  Result Value Ref Range   Vitamin B-12 243 200 -  1,100 pg/mL    Comment: . Please Note: Although the reference range for vitamin B12 is 941 275 2407 pg/mL, it has been reported that between 5 and 10% of patients with  values between 200 and 400 pg/mL may experience neuropsychiatric and hematologic abnormalities due to occult B12 deficiency; less than 1% of patients with values above 400 pg/mL will have symptoms. .    Folate 9.4 ng/mL    Comment:                            Reference Range                            Low:           <3.4                            Borderline:    3.4-5.4                            Normal:        >5.4 .     PHQ2/9:    11/04/2022   10:20 AM 07/29/2022    8:36 AM 05/08/2022    8:29 AM 02/26/2022   10:46 AM 02/24/2022   10:58 AM  Depression screen PHQ 2/9  Decreased Interest 0 0 0 0 0  Down, Depressed, Hopeless 0 0 0 0 0  PHQ - 2 Score 0 0 0 0 0  Altered sleeping 0  0 0 0  Tired, decreased energy 0  0 0 0  Change in appetite 0  0 0 0  Feeling bad or failure about yourself  0  0 0 0  Trouble concentrating 0  0 0 0  Moving slowly or fidgety/restless 0  0 0 0  Suicidal thoughts 0  0 0 0  PHQ-9 Score 0  0 0 0  Difficult doing work/chores     Not difficult at all    phq 9 is negative   Fall Risk:    11/04/2022   10:20 AM 07/29/2022    8:36 AM 05/08/2022    8:29 AM 02/26/2022   10:46 AM 02/24/2022   10:52 AM  Fall Risk   Falls in the past year? 1 0 0 0 1  Number falls in past yr: 1 0 0 0 0  Injury with Fall? 0 0 0 0 0  Risk for fall due to : Impaired balance/gait No Fall Risks No Fall Risks No Fall Risks Impaired balance/gait;Impaired mobility  Follow up Falls prevention discussed Falls prevention discussed Falls prevention discussed Falls prevention discussed       Functional Status Survey: Is the patient deaf or have difficulty hearing?: No Does the patient have difficulty seeing, even when wearing glasses/contacts?: No Does the patient have difficulty concentrating, remembering, or making decisions?: No Does the patient have difficulty walking or climbing stairs?: Yes Does the patient have difficulty dressing or bathing?: No Does the patient have difficulty  doing errands alone such as visiting a doctor's office or shopping?: No    Assessment & Plan  1. Controlled type 2 diabetes mellitus with microalbuminuria, without long-term current use of insulin (HCC)  - POCT HgB A1C - Urine Microalbumin w/creat. ratio - COMPLETE METABOLIC PANEL WITH GFR  2. Dyslipidemia associated with type 2 diabetes mellitus ([place])  -  Lipid Profile  3. 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[place])  Keep follow at [facility]  4. Seizure-like activity ([place])  Seen by neurologist   5. B12 deficiency  - CBC with Differential/Platelet  6. Contraindication to statin medication   7. Coronary artery disease of native artery of native heart with stable angina pectoris ([place])   8. Gastro-esophageal reflux disease without esophagitis  Controlled  9. Radiculitis of left cervical region  Keep follow up with pain clinic   10. Moderate protein-calorie malnutrition ([place])   Doing well gaining weight now

## 2022-11-04 ENCOUNTER — Ambulatory Visit (INDEPENDENT_AMBULATORY_CARE_PROVIDER_SITE_OTHER): Payer: Medicaid Other | Admitting: Family Medicine

## 2022-11-04 ENCOUNTER — Encounter: Payer: Self-pay | Admitting: Family Medicine

## 2022-11-04 ENCOUNTER — Other Ambulatory Visit: Payer: Self-pay | Admitting: Family Medicine

## 2022-11-04 VITALS — BP 126/68 | HR 74 | Resp 16 | Ht 71.0 in | Wt 222.0 lb

## 2022-11-04 DIAGNOSIS — E8840 Mitochondrial metabolism disorder, unspecified: Secondary | ICD-10-CM

## 2022-11-04 DIAGNOSIS — I25118 Atherosclerotic heart disease of native coronary artery with other forms of angina pectoris: Secondary | ICD-10-CM

## 2022-11-04 DIAGNOSIS — E1129 Type 2 diabetes mellitus with other diabetic kidney complication: Secondary | ICD-10-CM | POA: Diagnosis not present

## 2022-11-04 DIAGNOSIS — E538 Deficiency of other specified B group vitamins: Secondary | ICD-10-CM

## 2022-11-04 DIAGNOSIS — E1169 Type 2 diabetes mellitus with other specified complication: Secondary | ICD-10-CM | POA: Diagnosis not present

## 2022-11-04 DIAGNOSIS — R569 Unspecified convulsions: Secondary | ICD-10-CM

## 2022-11-04 DIAGNOSIS — E440 Moderate protein-calorie malnutrition: Secondary | ICD-10-CM

## 2022-11-04 DIAGNOSIS — K219 Gastro-esophageal reflux disease without esophagitis: Secondary | ICD-10-CM | POA: Diagnosis not present

## 2022-11-04 DIAGNOSIS — E785 Hyperlipidemia, unspecified: Secondary | ICD-10-CM

## 2022-11-04 DIAGNOSIS — Z5309 Procedure and treatment not carried out because of other contraindication: Secondary | ICD-10-CM | POA: Diagnosis not present

## 2022-11-04 DIAGNOSIS — M5412 Radiculopathy, cervical region: Secondary | ICD-10-CM | POA: Diagnosis not present

## 2022-11-04 DIAGNOSIS — R809 Proteinuria, unspecified: Secondary | ICD-10-CM

## 2022-11-04 LAB — POCT GLYCOSYLATED HEMOGLOBIN (HGB A1C): Hemoglobin A1C: 5.4 % (ref 4.0–5.6)

## 2022-11-04 NOTE — Telephone Encounter (Signed)
Requested Prescriptions  Pending Prescriptions Disp Refills   fluticasone furoate-vilanterol (BREO ELLIPTA) 100-25 MCG/ACT AEPB [Pharmacy Med Name: FLUTIC/VILAN 100-25MCG ORAL INH(30)] 180 each     Sig: INHALE 1 PUFF INTO THE LUNGS DAILY     Pulmonology:  Combination Products Passed - 11/04/2022  2:22 PM      Passed - Valid encounter within last 12 months    Recent Outpatient Visits           Today Controlled type 2 diabetes mellitus with microalbuminuria, without long-term current use of insulin [facility])   [facility] [name], Drue Stager, MD   3 months ago Controlled type 2 diabetes mellitus with microalbuminuria, without long-term current use of insulin [facility])   [facility] [place], Drue Stager, MD   6 months ago Controlled type 2 diabetes mellitus with microalbuminuria, without long-term current use of insulin [facility])   [facility] Steele Sizer, MD   8 months ago [facility] [place], Drue Stager, MD   9 months ago Controlled type 2 diabetes mellitus with microalbuminuria, without long-term current use of insulin [facility])   [facility] Steele Sizer, MD       Future Appointments             In 3 months Ancil Boozer, Drue Stager, MD [facility], [facility]

## 2022-11-05 ENCOUNTER — Encounter: Payer: Self-pay | Admitting: Family Medicine

## 2022-11-05 LAB — CBC WITH DIFFERENTIAL/PLATELET
Absolute Monocytes: 624 cells/uL (ref 200–950)
Basophils Absolute: 40 cells/uL (ref 0–200)
Basophils Relative: 0.5 %
Eosinophils Absolute: 104 cells/uL (ref 15–500)
Eosinophils Relative: 1.3 %
HCT: 53.7 % — ABNORMAL HIGH (ref 38.5–50.0)
Hemoglobin: 18.2 g/dL — ABNORMAL HIGH (ref 13.2–17.1)
Lymphs Abs: 3096 cells/uL (ref 850–3900)
MCH: 31 pg (ref 27.0–33.0)
MCHC: 33.9 g/dL (ref 32.0–36.0)
MCV: 91.5 fL (ref 80.0–100.0)
MPV: 10.1 fL (ref 7.5–12.5)
Monocytes Relative: 7.8 %
Neutro Abs: 4136 cells/uL (ref 1500–7800)
Neutrophils Relative %: 51.7 %
Platelets: 229 10*3/uL (ref 140–400)
RBC: 5.87 10*6/uL — ABNORMAL HIGH (ref 4.20–5.80)
RDW: 12.7 % (ref 11.0–15.0)
Total Lymphocyte: 38.7 %
WBC: 8 10*3/uL (ref 3.8–10.8)

## 2022-11-05 LAB — LIPID PANEL
Cholesterol: 236 mg/dL — ABNORMAL HIGH (ref <200)
HDL: 46 mg/dL (ref >=40)
LDL Cholesterol (Calc): 169 mg/dL (calc) — ABNORMAL HIGH
Non-HDL Cholesterol (Calc): 190 mg/dL (calc) — ABNORMAL HIGH (ref <130)
Total CHOL/HDL Ratio: 5.1 (calc) — ABNORMAL HIGH (ref <5.0)
Triglycerides: 95 mg/dL (ref <150)

## 2022-11-05 LAB — COMPLETE METABOLIC PANEL WITH GFR
AG Ratio: 1.6 (calc) (ref 1.0–2.5)
ALT: 14 U/L (ref 9–46)
AST: 12 U/L (ref 10–35)
Albumin: 4.4 g/dL (ref 3.6–5.1)
Alkaline phosphatase (APISO): 50 U/L (ref 35–144)
BUN: 8 mg/dL (ref 7–25)
CO2: 29 mmol/L (ref 20–32)
Calcium: 9.4 mg/dL (ref 8.6–10.3)
Chloride: 104 mmol/L (ref 98–110)
Creat: 0.71 mg/dL (ref 0.70–1.35)
Globulin: 2.7 g/dL (calc) (ref 1.9–3.7)
Glucose, Bld: 87 mg/dL (ref 65–99)
Potassium: 3.6 mmol/L (ref 3.5–5.3)
Sodium: 142 mmol/L (ref 135–146)
Total Bilirubin: 0.6 mg/dL (ref 0.2–1.2)
Total Protein: 7.1 g/dL (ref 6.1–8.1)
eGFR: 105 mL/min/{1.73_m2} (ref >=60)

## 2022-11-05 LAB — MICROALBUMIN / CREATININE URINE RATIO
Creatinine, Urine: 98 mg/dL (ref 20–320)
Microalb Creat Ratio: 44 mcg/mg creat — ABNORMAL HIGH (ref <30)
Microalb, Ur: 4.3 mg/dL

## 2022-11-06 ENCOUNTER — Other Ambulatory Visit: Payer: Self-pay | Admitting: Family Medicine

## 2022-11-06 ENCOUNTER — Encounter: Payer: Self-pay | Admitting: Cardiovascular Disease

## 2022-11-10 ENCOUNTER — Ambulatory Visit: Payer: Medicaid Other | Admitting: Cardiovascular Disease

## 2022-11-11 DIAGNOSIS — M6281 Muscle weakness (generalized): Secondary | ICD-10-CM | POA: Diagnosis not present

## 2022-11-18 ENCOUNTER — Other Ambulatory Visit: Payer: Self-pay | Admitting: Family Medicine

## 2022-11-23 ENCOUNTER — Other Ambulatory Visit: Payer: Self-pay | Admitting: Family Medicine

## 2022-11-23 DIAGNOSIS — E1129 Type 2 diabetes mellitus with other diabetic kidney complication: Secondary | ICD-10-CM

## 2022-11-25 ENCOUNTER — Ambulatory Visit: Payer: Medicaid Other

## 2022-11-26 ENCOUNTER — Ambulatory Visit: Payer: Medicaid Other

## 2022-11-26 ENCOUNTER — Encounter: Payer: Self-pay | Admitting: Pulmonary Disease

## 2022-11-26 ENCOUNTER — Ambulatory Visit (INDEPENDENT_AMBULATORY_CARE_PROVIDER_SITE_OTHER): Payer: Medicaid Other

## 2022-11-26 ENCOUNTER — Ambulatory Visit: Payer: Medicaid Other | Admitting: Pulmonary Disease

## 2022-11-26 VITALS — BP 122/80 | HR 62 | Temp 97.7°F | Ht 71.0 in | Wt 223.0 lb

## 2022-11-26 DIAGNOSIS — G4733 Obstructive sleep apnea (adult) (pediatric): Secondary | ICD-10-CM | POA: Diagnosis not present

## 2022-11-26 DIAGNOSIS — J4530 Mild persistent asthma, uncomplicated: Secondary | ICD-10-CM | POA: Diagnosis not present

## 2022-11-26 DIAGNOSIS — G713 Mitochondrial myopathy, not elsewhere classified: Secondary | ICD-10-CM

## 2022-11-26 DIAGNOSIS — E538 Deficiency of other specified B group vitamins: Secondary | ICD-10-CM

## 2022-11-26 DIAGNOSIS — R0609 Other forms of dyspnea: Secondary | ICD-10-CM

## 2022-11-26 MED ORDER — FLUTICASONE FUROATE-VILANTEROL 100-25 MCG/ACT IN AEPB: 1.0000 | INHALATION_SPRAY | Freq: Every day | RESPIRATORY_TRACT | 6 refills | Status: DC

## 2022-11-26 MED ORDER — CYANOCOBALAMIN 1000 MCG/ML IJ SOLN
1000.0000 ug | Freq: Once | INTRAMUSCULAR | Status: AC
  Administered 2022-11-26: 1000 ug via INTRAMUSCULAR

## 2022-11-26 NOTE — Patient Instructions (Signed)
We are going to order a breathing test that will let us know about your muscle strength.  We are ordering a new sleep study that will let us know what type of machine is best for you.  We refilled your Breo medication.  We will see you in follow-up in 3 months time call sooner should any new problems arise.

## 2022-11-26 NOTE — Progress Notes (Signed)
   Cardiology Clinic Note   Patient Name: [name] [name] Date of Encounter: 11/27/2022  Primary Care Provider:  Sowles, Krichna, MD Primary Cardiologist:  Muhammad Arida, MD  Patient Profile    61 year-old male with a history of nonobstructive coronary artery disease, syncope, type 2 diabetes, hypertension, hyperlipidemia, obesity, and OSA n CPAP, who presents for follow-up of CAD.   Past Medical History    Past Medical History:  Diagnosis Date   Allergy    Angina, class III (HCC)    Chronic constipation    Degeneration of intervertebral disc of cervical region    Diabetes mellitus without complication (HCC)    Diastolic dysfunction    a. 06/2017 Echo: EF 60-65%, no rwma, Gr1 DD.   Dyspnea    GERD (gastroesophageal reflux disease)    Hernia 1991   02/10/2012-RIH repair   Hyperlipidemia    Hypertension 2008   Nerve root pain    Neuropathy    Non-obstructive CAD (coronary artery disease)    a. 05/30/2015 cath: LM nl, mLAD 50% (FFR 0.83), LCx minor irregs, RCA minor irregs, EF 55-65%-->Med Rx; b. 03/2016 Cath: LM nl LAD 30m, D1/2/3 min irregs, LCX min irregs, OM1/2/3 nl, RCA min irregs, RPDA/RPAV/RPL1/RPL2 nl-->Med Rx; b. 07/2020 Cath: LM nl, LAD 30m, D1/2/3 min irregs, LCX min irregs, RCA min irregs, RPDA/RPAV/RPL1,2 nl, EF 55-65%.   Obesity, unspecified 2012   Personal history of tobacco use, presenting hazards to health 2012   Polycythemia    Rectal bleeding 02/11/2017   Recurrent Right Inguinal Hernia Repair 02/09/2011, 04/07/2013   Dr Sankar, [facility]   Shortness of breath    Sleep apnea    a. On CPAP;  b. 06/2017 Echo: no PAH.   Umbilical hernia without mention of obstruction or gangrene 02/09/2011   Dr Sankar, [facility]   Vitamin D deficiency    Past Surgical History:  Procedure Laterality Date   BACK SURGERY  12/2008   X2-LUMBAR   CARDIAC CATHETERIZATION N/A 05/30/2015   Procedure: Left Heart Cath;  Surgeon: Muhammad A Arida, MD;  Location: [facility] INVASIVE CV LAB;  Service:  Cardiovascular;  Laterality: N/A;   CARDIAC CATHETERIZATION N/A 04/16/2016   Procedure: Left Heart Cath and Coronary Angiography;  Surgeon: Muhammad A Arida, MD;  Location: [facility] INVASIVE CV LAB;  Service: Cardiovascular;  Laterality: N/A;   COLONOSCOPY  2014   Dr. Sankar   COLONOSCOPY WITH PROPOFOL N/A 12/25/2016   Procedure: COLONOSCOPY WITH PROPOFOL;  Surgeon: Kiran Anna, MD;  Location: [facility] ENDOSCOPY;  Service: Endoscopy;  Laterality: N/A;   COLONOSCOPY WITH PROPOFOL N/A 01/29/2017   Procedure: COLONOSCOPY WITH PROPOFOL;  Surgeon: Kiran Anna, MD;  Location: [facility]OS[facility];  Service: Endoscopy;  Laterality: N/A;   COLONOSCOPY WITH PROPOFOL N/A 04/11/2020   Procedure: COLONOSCOPY WITH PROPOFOL;  Surgeon: Anna, Kiran, MD;  Location: [facility]OSCOPY;  Service: Gastroenterology;  Laterality: N/A;   ESOPHAGEAL MANOMETRY N/A 03/04/2022   Procedure: ESOPHAGEAL MANOMETRY (EM);  Surgeon: Nandigam, Kavitha V, MD;  Location: [facility];  Service: Gastroenterology;  Laterality: N/A;   ESOPHAGOGASTRODUODENOSCOPY (EGD) WITH PROPOFOL N/A 01/26/2019   Procedure: ESOPHAGOGASTRODUODENOSCOPY (EGD) WITH PROPOFOL;  Surgeon: Vanga, Rohini Reddy, MD;  Location: [facility];  Service: Gastroenterology;  Laterality: N/A;   ESOPHAGOGASTRODUODENOSCOPY (EGD) WITH PROPOFOL N/A 09/10/2021   Procedure: ESOPHAGOGASTRODUODENOSCOPY (EGD) WITH PROPOFOL;  Surgeon: Vanga, Rohini Reddy, MD;  Location: [facility];  Service: Gastroenterology;  Laterality: N/A;   EVALUATION UNDER ANESTHESIA WITH HEMORRHOIDECTOMY N/A 02/24/2017   Procedure: EXAM UNDER ANESTHESIA WITH   POSSIBLE EXCISION OF INTERNAL HEMORRHOIDS;  Surgeon: Jose Piscoya, MD;  Location: [facility] OR[facility];  Service: General;  Laterality: N/A;   FISSURECTOMY  02/24/2017   Procedure: FISSURECTOMY;  Surgeon: Jose Piscoya, MD;  Location: [facility] OR[facility];  Service: General;;   HAND SURGERY Left 2020   HERNIA REPAIR Right 1991   INGUINAL HERNIA REPAIR Right 2012   Dr Sankar   INGUINAL HERNIA  REPAIR Right 2014   Dr Sankar   INTRAVASCULAR PRESSURE WIRE/FFR STUDY N/A 07/30/2021   Procedure: INTRAVASCULAR PRESSURE WIRE/FFR STUDY;  Surgeon: Arida, Muhammad A, MD;  [facility];  Service: Cardiovascular;  Laterality: N/A;   LEFT HEART CATH AND CORONARY ANGIOGRAPHY Left 08/05/2020   Procedure: LEFT HEART CATH AND CORONARY ANGIOGRAPHY poss PCI;  Surgeon: Arida, Muhammad A, MD;  Location: [facility];  Service: Cardiovascular;  Laterality: Left;   MUSCLE BIOPSY Left 12/31/2021   Procedure: LEFT VASTUS LATERALIS BIOPSY;  Surgeon: Yarbrough, Chester, MD;  Location: [facility] OR[facility];  Service: Neurosurgery;  Laterality: Left;   RIGHT/LEFT HEART CATH AND CORONARY ANGIOGRAPHY N/A 07/30/2021   Procedure: RIGHT/LEFT HEART CATH AND CORONARY ANGIOGRAPHY;  Surgeon: Arida, Muhammad A, MD;  Location: [facility];  Service: Cardiovascular;  Laterality: N/A;    Allergies  Allergies  Allergen Reactions   Gabapentin Other (See Comments)    Groggy-Mood Changes   Latex Rash   Penicillins Hives and Rash    Has patient had a PCN reaction causing immediate rash, facial/tongue/throat swelling, SOB or lightheadedness with hypotension: Yes Has patient had a PCN reaction causing severe rash involving mucus membranes or skin necrosis: No Has patient had a PCN reaction that required hospitalization No Has patient had a PCN reaction occurring within the last 10 years: No If all of the above answers are "NO", then may proceed with Cephalosporin use.     History of Present Illness    [name] [name] is a 61 year-old male with a past medical history of nonobstructive CADm syncope, type 2 diabetes, hypertensin, hyperlipidemia, obesity, and OSA on CPAP.  LHC in 2016 showed moderate mid LAD stenosis estimated at 50% with a FFR of 0.83 and a normal LVEF with normal LVEDP. Repeat LHC in 03/2016 with worsening angina noted improvement in the mid LAD stenosis estimated at 30% with no evidence of  obstructive disease. In 2018 he suffered from several episodes of loss of consciousness with unclear etiology. Echo with normal LVEF. He did wear a 2-week Zio monitor with no significant arrhythmia. Evaluated by Neurology was unrevealing. 07/2017 underwent LHC with 1-vessel CAD of 30% stenosis of the mid LAD, EF normal with mildly elevated LVEDP.  For worsening exertional dyspnea and intermittent lightheadedness he underwent Lexiscan MPI 12/2020 that showed no evidence of ischemia and CT attenuation images showing no coronary calcification in LAD and RCA.  Echocardiogram 02/11/2021 revealed LVEF of 60 to 65%, no wall motion abnormalities, mild LVH, G1 DD, no significant valvular abnormalities.  He was subsequently referred to pulmonary.  PFTs 04/15/2021 with mild obstructive airway disease without significant bronchodilator response.  CPX in 06/2021 as outlined below.  Subsequent R/LHC on 07/30/2021 showed mild nonobstructive disease involving the LAD with 10% ostial to mid LAD stenosis and a mid LAD stenosis mid 30% with the vessel being mildly calcified.  Otherwise there was no obstructive disease.  Right heart cath showed normal filling pressures, normal pulmonary pressure, and normal cardiac output.  FFR of the LAD lesion of 0.87 and GFR 1.8.  This   is not significant to require stenting.  INR was normal at 22 indicating normal microvascular function.  These cardiac findings did not explain the severity of the symptoms.  He was subsequently been evaluated by neurosurgery, oncology, GI for muscle atrophy.  He has suffered from unintentional weight loss and dysphagia respectively.  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  He was last seen in clinic 07/20/2022 where he reported he had not been doing well.  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  He had lost over 60 pounds.  He was doing home therapy 3 times weekly and now walking with a cane.  He has chronic  shortness of breath since myopathy started.  No chest pain this morning his heart muscles were normal.  He states that he has been compliant with CPAP.  He returns to clinic today with complaints of worsening dyspnea on exertion and palpitations.  He denies any chest pain but states that with his mild cardiomyopathy that he is worried with all the muscle wasting that he has that may be impacting his heart muscle as well.  He also states that he was advised that his cholesterol was extremely elevated but he is not sure who or when his cholesterol medication had been discontinued he had previously been on it with numbers well within goal.  Continue to follow-up with [facility] for his mitochondrial myopathy.  He denies any recent hospitalizations or emergency department visits.  Home Medications    Current Outpatient Medications  Medication Sig Dispense Refill   ACCU-CHEK GUIDE test strip USE TO TEST BLOOD SUGAR THREE TIMES DAILY AS NEEDED 100 strip 1   Accu-Chek Softclix Lancets lancets TEST TWICE DAILY 100 each 3   Alpha Lipoic Acid 200 MG CAPS Take 200 mg by mouth daily.     blood glucose meter kit and supplies 1 each by Other route in the morning and at bedtime. Dispense based on patient and insurance preference. Use up to two  times daily. (FOR ICD-10 E10.9, E11.9). 1 each 0   carvedilol (COREG) 6.25 MG tablet TAKE 1 TABLET(6.25 MG) BY MOUTH TWICE DAILY 180 tablet 1   Coenzyme Q10 (COQ-10) 100 MG CAPS Take 100 mg by mouth daily.     ezetimibe (ZETIA) 10 MG tablet Take 1 tablet (10 mg total) by mouth daily. 90 tablet 3   FARXIGA 10 MG TABS tablet Take 1 tablet (10 mg total) by mouth daily. 30 tablet 3   fluticasone (FLONASE) 50 MCG/ACT nasal spray Place 1 spray into both nostrils daily as needed for allergies or rhinitis.     fluticasone furoate-vilanterol (BREO ELLIPTA) 100-25 MCG/ACT AEPB Inhale 1 puff into the lungs daily. 60 each 6   ketoconazole (NIZORAL) 2 % shampoo Apply 1 Application topically  3 (three) times a week. Wash scalp/body 3 times a week as needed for alres, let sit 5 minutes before rinsing off, avoid eyes. May use once monthly for maintenance. 120 mL 11   losartan-hydrochlorothiazide (HYZAAR) 50-12.5 MG tablet TAKE 1 TABLET BY MOUTH DAILY 90 tablet 2   mupirocin ointment (BACTROBAN) 2 % Apply 1 application topically daily. Qd to excision site (Patient taking differently: Apply 1 application  topically daily as needed (skin irritation.). Qd to excision site) 22 g 1   nitroGLYCERIN (NITROSTAT) 0.4 MG SL tablet Place 1 tablet (0.4 mg total) under the tongue every 5 (five) minutes as needed for chest pain.     omeprazole (PRILOSEC) 40 MG capsule TAKE 1   CAPSULE(40 MG) BY MOUTH TWICE DAILY BEFORE A MEAL 60 capsule 5   ranolazine (RANEXA) 1000 MG SR tablet Take 1 tablet (1,000 mg total) by mouth 2 (two) times daily. 180 tablet 3   Riboflavin (B-2) 100 MG TABS Take 100 mg by mouth daily.     sildenafil (VIAGRA) 100 MG tablet Take 1 tablet (100 mg total) by mouth daily as needed for erectile dysfunction. 30 tablet 1   sucralfate (CARAFATE) 1 GM/10ML suspension SHAKE LIQUID AND TAKE 10 ML(1 GRAM) BY MOUTH FOUR TIMES DAILY 420 mL 1   temazepam (RESTORIL) 15 MG capsule TAKE 1 CAPSULE(15 MG) BY MOUTH AT BEDTIME AS NEEDED FOR SLEEP 90 capsule 0   TRULICITY 8.93 TD/4.2AJ SOPN ADMINISTER 0.75 MG UNDER THE SKIN 1 TIME A WEEK 6 mL 0   No current facility-administered medications for this visit.     Family History    Family History  Problem Relation Age of Onset   Hypertension Mother    Heart Problems Mother    High Cholesterol Mother    Heart Problems Father 77       myocardial infarction   Mental illness Brother    Other Maternal Aunt        COVID   Lung cancer Maternal Aunt    Lung cancer Maternal Aunt    Cancer Maternal Uncle        unknown   Prostate cancer Maternal Uncle    Testicular cancer Paternal Uncle    Thyroid disease Daughter    Breast cancer Cousin    He  indicated that his mother is alive. He indicated that his father is deceased. He indicated that only one of his two brothers is alive. He indicated that his maternal grandmother is deceased. He indicated that his maternal grandfather is deceased. He indicated that his paternal grandmother is deceased. He indicated that his paternal grandfather is deceased. He indicated that his daughter is alive. He indicated that all of his four maternal aunts are deceased. He indicated that his maternal uncle is deceased. He indicated that his paternal uncle is deceased. He indicated that his cousin is deceased.  Social History    Social History   Socioeconomic History   Marital status: Single    Spouse name: Not on file   Number of children: Not on file   Years of education: Not on file   Highest education level: Not on file  Occupational History   Not on file  Tobacco Use   Smoking status: Former    Types: Cigars    Start date: 12/28/2002    Quit date: 06/04/2005    Years since quitting: 17.4   Smokeless tobacco: Never   Tobacco comments:    quit 2006-smoked 1 cigar occ  Vaping Use   Vaping Use: Never used  Substance and Sexual Activity   Alcohol use: Not Currently    Comment: has not had a drink in 8 months 02/2021   Drug use: No   Sexual activity: Yes    Partners: Female  Other Topics Concern   Not on file  Social History Narrative   Not on file   Social Determinants of Health   Financial Resource Strain: Low Risk  (01/29/2020)   Overall Financial Resource Strain (CARDIA)    Difficulty of Paying Living Expenses: Not hard at all  Food Insecurity: No Food Insecurity (01/29/2020)   Hunger Vital Sign    Worried About [place] in the Last Year: Never true  Ran Out of Food in the Last Year: Never true  Transportation Needs: No Transportation Needs (01/29/2020)   PRAPARE - Transportation    Lack of Transportation (Medical): No    Lack of Transportation (Non-Medical): No  Physical  Activity: Insufficiently Active (01/29/2020)   Exercise Vital Sign    Days of Exercise per Week: 3 days    Minutes of Exercise per Session: 40 min  Stress: No Stress Concern Present (06/12/2019)   [employer] - Occupational Stress Questionnaire    Feeling of Stress : Not at all  Social Connections: Moderately Integrated (06/12/2019)   Social Connection and Isolation Panel [NHANES]    Frequency of Communication with Friends and Family: More than three times a week    Frequency of Social Gatherings with Friends and Family: More than three times a week    Attends Religious Services: More than 4 times per year    Active Member of Clubs or Organizations: Yes    Attends Club or Organization Meetings: More than 4 times per year    Marital Status: Divorced  Intimate Partner Violence: Not At Risk (06/12/2019)   Humiliation, Afraid, Rape, and Kick questionnaire    Fear of Current or Ex-Partner: No    Emotionally Abused: No    Physically Abused: No    Sexually Abused: No     Review of Systems    General:  No chills, fever, night sweats or endorses weight changes.  Dors is worsening fatigue Cardiovascular:  No chest pain, endorses dyspnea on exertion, edema, orthopnea, endorses palpitations, paroxysmal nocturnal dyspnea. Dermatological: No rash, lesions/masses Respiratory: No cough, endorses dyspnea Urologic: No hematuria, dysuria Abdominal:   No nausea, vomiting, diarrhea, bright red blood per rectum, melena, or hematemesis Neurologic:  No visual changes, wkns, changes in mental status.  Endorses worsening muscle weakness and gait instability All other systems reviewed and are otherwise negative except as noted above.   Physical Exam    VS:  BP (!) 152/90 (BP Location: Left Arm, Patient Position: Sitting, Cuff Size: Normal)   Pulse 64   Ht 5' 11" (1.803 m)   Wt 223 lb 12.8 oz (101.5 kg)   SpO2 94%   BMI 31.21 kg/m  , BMI Body mass index is 31.21 kg/m.      GEN: Well nourished, well developed, in no acute distress.  Does have gait instability walking with a cane HEENT: normal. Neck: Supple, no JVD, carotid bruits, or masses. Cardiac: RRR, no murmurs, rubs, or gallops. No clubbing, cyanosis, edema.  Radials 2+/PT 2+ and equal bilaterally.  Respiratory:  Respirations regular and unlabored, clear to auscultation bilaterally. GI: Soft, nontender, nondistended, BS + x 4. MS: no deformity or atrophy. Skin: warm and dry, no rash. Neuro:  Strength and sensation are intact. Psych: Normal affect.  Accessory Clinical Findings    ECG personally reviewed by me today-no new tracings were completed today  Lab Results  Component Value Date   WBC 8.0 11/04/2022   HGB 18.2 (H) 11/04/2022   HCT 53.7 (H) 11/04/2022   MCV 91.5 11/04/2022   PLT 229 11/04/2022   Lab Results  Component Value Date   CREATININE 0.71 11/04/2022   BUN 8 11/04/2022   NA 142 11/04/2022   K 3.6 11/04/2022   CL 104 11/04/2022   CO2 29 11/04/2022   Lab Results  Component Value Date   ALT 14 11/04/2022   AST 12 11/04/2022   ALKPHOS 61 02/28/2021   BILITOT 0.6 11/04/2022     Lab Results  Component Value Date   CHOL 236 (H) 11/04/2022   HDL 46 11/04/2022   LDLCALC 169 (H) 11/04/2022   TRIG 95 11/04/2022   CHOLHDL 5.1 (H) 11/04/2022    Lab Results  Component Value Date   HGBA1C 5.4 11/04/2022    Assessment & Plan   1.  Dyspnea on exertion that he states has been worsening likely exacerbated by progressive mild cardiomyopathy.  Last echocardiogram revealed LV EF that was normal with G1 DD.  With continued symptoms and progressive disease process we will repeat echocardiogram.  He remains euvolemic on exam and is continued on carvedilol, Farxiga, and losartan/HCTZ.  2.  Palpitations that he states are worsening.  Previous long-term heart monitor showed 1 run of SVT lasting 9 beats and rare PVCs and PACs.  With the continuation of palpitations we will repeat ZIO XT  monitor.  3.  Hypertension with blood pressure today 152/90.  Unfortunately has not any missed medications prior to his appointment today.  He has been continued on Coreg 6.25 mg twice daily, losartan HCTZ 50/12.5 mg daily.  At his pulmonary appointment a few days ago blood pressure was 120s systolic.  4.  Nonobstructive CAD by cath in 07/2021.  He denies change in chest pain but continues with worsening dyspnea on exertion likely exacerbated by his mitochondrial myopathy.  No ischemic work-up at this time.  He is continued on carvedilol, Ranexa, and sublingual nitro as needed.  5.  Mixed hyperlipidemia with an LDL calculated 169 on 11/04/2022.  Previously in 06/2021 he had been 58.  He has a statin intolerance and had been well controlled on Zetia.  According to the patient someone had discontinued the Zetia but he is unsure as to who.  We are restarting his Zetia 10 mg daily at this time with repeat lipid and hepatic panel to be done in 3 months.  6.  Obstructive sleep apnea where he continues to be compliant with CPAP.  7.  Disposition patient return to clinic to see MD/APP once echo and monitor results are complete or sooner if needed.   , NP 11/27/2022, 2:06 PM    

## 2022-11-26 NOTE — Progress Notes (Signed)
Subjective:    Patient ID: [name] [name], male    DOB: [birth year], 61 y.o.   MRN: [id number] Patient Care Team: Steele Sizer, MD as PCP - General (Family Medicine) Wellington Hampshire, MD as PCP - Cardiology (Cardiology)  Chief Complaint  Patient presents with   Follow-up    SOB and wheezing with exertion. No cough.    HPI Patient is a 61 year old remote cigar smoker who presents for follow-up on the issue of dyspnea.  I saw him on initial evaluation on 27 March 2021 patient was then subsequently seen by NP Volanda Napoleon, lost to follow-up until today.  He also has a history of airways reactivity and notes Breo helps with his symptoms.  He has also a history of obstructive sleep apnea on CPAP.  Today patient presents mostly for refill on his Breo Ellipta.  Since his prior visit he has been diagnosed with a mitochondrial myopathy which is likely the source of his issues with shortness of breath.  He also has a cervical radiculopathy.  Nevertheless he notes that Memory Dance does help with preventing wheezing and cough.  As noted he has been diagnosed previously with sleep apnea this was being managed by his primary care physician has been on CPAP for over 8 years.  It is that he has become more intolerant of the CPAP because he feels the pressure is too high.  We do not have a download his compliance nor what his current settings are.  He  inquired about the [facility] device however, given his myopathy and radiculopathy issues this device would likely not be effective.  Overall he feels that he is "hanging in there".  He has noted that his shortness of breath has improved as much as possible with increased activity.  He does note that Memory Dance does help him he likely has some mild airways reactivity.   Review of Systems A 10 point review of systems was performed and it is as noted above otherwise negative.  Past Medical History:  Diagnosis Date   Allergy    Angina, class III (HCC)    Chronic constipation     Degeneration of intervertebral disc of cervical region    Diabetes mellitus without complication (HCC)    Diastolic dysfunction    a. 06/2017 Echo: EF 60-65%, no rwma, Gr1 DD.   Dyspnea    GERD (gastroesophageal reflux disease)    Hernia 1991   02/10/2012-RIH repair   Hyperlipidemia    Hypertension 2008   Nerve root pain    Neuropathy    Non-obstructive CAD (coronary artery disease)    a. 05/30/2015 cath: LM nl, mLAD 50% (FFR 0.83), LCx minor irregs, RCA minor irregs, EF 55-65%-->Med Rx; b. 03/2016 Cath: LM nl LAD 58m D1/2/3 min irregs, LCX min irregs, OM1/2/3 nl, RCA min irregs, RPDA/RPAV/RPL1/RPL2 nl-->Med Rx; b. 07/2020 Cath: LM nl, LAD 323mD1/2/3 min irregs, LCX min irregs, RCA min irregs, RPDA/RPAV/RPL1,2 nl, EF 55-65%.   Obesity, unspecified 2012   Personal history of tobacco use, presenting hazards to health 2012   Polycythemia    Rectal bleeding 02/11/2017   Recurrent Right Inguinal Hernia Repair 02/09/2011, [facility]07[facility] Shortness of breath    Sleep apnea    a. On CPAP;  b. 06/2017 Echo: no PAH.   Umbilical hernia without mention of obstruction or gangrene 02/09/2011   [facility] Vitamin D deficiency    Past Surgical History:  Procedure Laterality Date  BACK SURGERY  12/2008   X2-LUMBAR   CARDIAC CATHETERIZATION N/A 05/30/2015   Procedure: Left Heart Cath;  Surgeon: Wellington Hampshire, MD;  Location: [place] [facility];  Service: Cardiovascular;  Laterality: N/A;   CARDIAC CATHETERIZATION N/A 04/16/2016   Procedure: Left Heart Cath and Coronary Angiography;  Surgeon: Wellington Hampshire, MD;  Location: [place] [facility];  Service: Cardiovascular;  Laterality: N/A;   COLONOSCOPY  2014   Dr. [name]   COLONOSCOPY WITH PROPOFOL N/A 12/25/2016   Procedure: COLONOSCOPY WITH PROPOFOL;  Surgeon: Jonathon Bellows, MD;  Location: [facility] ENDOSCOPY;  Service: Endoscopy;  Laterality: N/A;   COLONOSCOPY WITH PROPOFOL N/A 01/29/2017   Procedure: COLONOSCOPY WITH PROPOFOL;   Surgeon: Jonathon Bellows, MD;  Location: [facility] ENDOSCOPY;  Service: Endoscopy;  Laterality: N/A;   COLONOSCOPY WITH PROPOFOL N/A 04/11/2020   Procedure: COLONOSCOPY WITH PROPOFOL;  Surgeon: Jonathon Bellows, MD;  Location: [facility]COPY;  Service: Gastroenterology;  Laterality: N/A;   ESOPHAGEAL MANOMETRY N/A 03/04/2022   Procedure: ESOPHAGEAL MANOMETRY (EM);  Surgeon: Mauri Pole, MD;  Location: [facility] ENDOSCOPY;  Service: Gastroenterology;  Laterality: N/A;   ESOPHAGOGASTRODUODENOSCOPY (EGD) WITH PROPOFOL N/A 01/26/2019   Procedure: ESOPHAGOGASTRODUODENOSCOPY (EGD) WITH PROPOFOL;  Surgeon: Lin Landsman, MD;  Location: [facility] ENDOSCOPY;  Service: Gastroenterology;  Laterality: N/A;   ESOPHAGOGASTRODUODENOSCOPY (EGD) WITH PROPOFOL N/A 09/10/2021   Procedure: ESOPHAGOGASTRODUODENOSCOPY (EGD) WITH PROPOFOL;  Surgeon: Lin Landsman, MD;  Location: [facility] ENDOSCOPY;  Service: Gastroenterology;  Laterality: N/A;   EVALUATION UNDER ANESTHESIA WITH HEMORRHOIDECTOMY N/A 02/24/2017   Procedure: EXAM UNDER ANESTHESIA WITH POSSIBLE EXCISION OF INTERNAL HEMORRHOIDS;  Surgeon: Olean Ree, MD;  Location: [facility] ORS;  Service: General;  Laterality: N/A;   FISSURECTOMY  02/24/2017   Procedure: FISSURECTOMY;  Surgeon: Olean Ree, MD;  Location: [facility] ORS;  Service: General;;   HAND SURGERY Left 2020   HERNIA REPAIR Right 1991   INGUINAL HERNIA REPAIR Right 2012   Dr Teryl Lucy HERNIA REPAIR Right 2014   Dr [name]   INTRAVASCULAR PRESSURE WIRE/FFR STUDY N/A 07/30/2021   Procedure: INTRAVASCULAR PRESSURE WIRE/FFR STUDY;  Surgeon: Wellington Hampshire, MD;  Location: [place] CV LAB;  Service: Cardiovascular;  Laterality: N/A;   LEFT HEART CATH AND CORONARY ANGIOGRAPHY Left 08/05/2020   Procedure: LEFT HEART CATH AND CORONARY ANGIOGRAPHY poss PCI;  Surgeon: Wellington Hampshire, MD;  Location: [place] CV LAB;  Service: Cardiovascular;  Laterality: Left;   MUSCLE BIOPSY Left 12/31/2021   Procedure: LEFT VASTUS  LATERALIS BIOPSY;  Surgeon: Meade Maw, MD;  Location: [facility] ORS;  Service: Neurosurgery;  Laterality: Left;   RIGHT/LEFT HEART CATH AND CORONARY ANGIOGRAPHY N/A 07/30/2021   Procedure: RIGHT/LEFT HEART CATH AND CORONARY ANGIOGRAPHY;  Surgeon: Wellington Hampshire, MD;  Location: [place] CV LAB;  Service: Cardiovascular;  Laterality: N/A;   Patient Active Problem List   Diagnosis Date Noted   Neurogenic muscular atrophy 05/08/2022   Other insomnia 05/08/2022   Controlled type 2 diabetes mellitus with microalbuminuria, without long-term current use of insulin ([place]) 05/08/2022   Contraindication to statin medication 05/08/2022   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[place]) 01/22/2022   Tremor 01/22/2022   Shortness of breath 01/22/2022   Muscle atrophy of lower extremity 11/10/2021   Dysphagia    Asthma 05/14/2021   Multiple thyroid nodules 05/14/2021   Localized, primary osteoarthritis of hand 09/28/2019   Seizure-like activity ([place]) 02/22/2018   Mild cognitive impairment 11/17/2017   Radiculopathy of cervical region 11/17/2017   Stenosis of carotid artery  09/08/2017   Cervical stenosis of spine 09/08/2017   B12 deficiency 06/28/2017   Headache disorder 02/15/2017   Memory loss or impairment 02/15/2017   Coronary artery disease involving native coronary artery of native heart    Radiculitis of left cervical region 03/05/2016   Diabetic neuropathy associated with type 2 diabetes mellitus ([place]) 12/05/2015   Patellar subluxation 06/05/2015   Allergic rhinitis, seasonal 06/05/2015   Chronic constipation 06/05/2015   Chronic lower back pain 06/05/2015   Vitamin D deficiency 06/05/2015   Central sleep apnea 06/05/2015   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   Nerve root pain 06/05/2015   Acquired polycythemia 06/05/2015   Anterior knee pain 06/05/2015   Failure of erection 06/05/2015   Gastro-esophageal reflux disease without esophagitis 06/05/2015   Neuropathy 06/05/2015   Angina, class III ([place])  05/23/2015   Essential hypertension 05/23/2015   Hyperlipidemia 05/23/2015   Inguinal hernia without mention of obstruction or gangrene, recurrent unilateral or unspecified 03/24/2013   Family History  Problem Relation Age of Onset   Hypertension Mother    Heart Problems Mother    High Cholesterol Mother    Heart Problems Father 106       myocardial infarction   Mental illness Brother    Other Maternal Aunt        COVID   Lung cancer Maternal Aunt    Lung cancer Maternal Aunt    Cancer Maternal Uncle        unknown   Prostate cancer Maternal Uncle    Testicular cancer Paternal Uncle    Thyroid disease Daughter    Breast cancer Cousin    Social History   Tobacco Use   Smoking status: Former    Types: Cigars    Start date: 12/28/2002    Quit date: 06/04/2005    Years since quitting: 17.4   Smokeless tobacco: Never   Tobacco comments:    quit 2006-smoked 1 cigar occ  Substance Use Topics   Alcohol use: Not Currently    Comment: has not had a drink in 8 months 02/2021   Allergies  Allergen Reactions   Gabapentin Other (See Comments)    Groggy-Mood Changes   Latex Rash   Penicillins Hives and Rash    Has patient had a PCN reaction causing immediate rash, facial/tongue/throat swelling, SOB or lightheadedness with hypotension: Yes Has patient had a PCN reaction causing severe rash involving mucus membranes or skin necrosis: No Has patient had a PCN reaction that required hospitalization No Has patient had a PCN reaction occurring within the last 10 years: No If all of the above answers are "NO", then may proceed with Cephalosporin use.    Current Meds  Medication Sig   ACCU-CHEK GUIDE test strip USE TO TEST BLOOD SUGAR THREE TIMES DAILY AS NEEDED   Accu-Chek Softclix Lancets lancets TEST TWICE DAILY   Alpha Lipoic Acid 200 MG CAPS Take 200 mg by mouth daily.   blood glucose meter kit and supplies 1 each by Other route in the morning and at bedtime. Dispense based on  patient and insurance preference. Use up to two  times daily. (FOR ICD-10 E10.9, E11.9).   carvedilol (COREG) 6.25 MG tablet TAKE 1 TABLET(6.25 MG) BY MOUTH TWICE DAILY   Coenzyme Q10 (COQ-10) 100 MG CAPS Take 100 mg by mouth daily.   FARXIGA 10 MG TABS tablet Take 1 tablet (10 mg total) by mouth daily.   fluticasone (FLONASE) 50 MCG/ACT nasal spray Place 1 spray into both nostrils daily  as needed for allergies or rhinitis.   ketoconazole (NIZORAL) 2 % shampoo Apply 1 Application topically 3 (three) times a week. Wash scalp/body 3 times a week as needed for alres, let sit 5 minutes before rinsing off, avoid eyes. May use once monthly for maintenance.   losartan-hydrochlorothiazide (HYZAAR) 50-12.5 MG tablet TAKE 1 TABLET BY MOUTH DAILY   mupirocin ointment (BACTROBAN) 2 % Apply 1 application topically daily. Qd to excision site (Patient taking differently: Apply 1 application  topically daily as needed (skin irritation.). Qd to excision site)   nitroGLYCERIN (NITROSTAT) 0.4 MG SL tablet Place 1 tablet (0.4 mg total) under the tongue every 5 (five) minutes as needed for chest pain.   omeprazole (PRILOSEC) 40 MG capsule TAKE 1 CAPSULE(40 MG) BY MOUTH TWICE DAILY BEFORE A MEAL   ranolazine (RANEXA) 1000 MG SR tablet Take 1 tablet (1,000 mg total) by mouth 2 (two) times daily.   Riboflavin (B-2) 100 MG TABS Take 100 mg by mouth daily.   sildenafil (VIAGRA) 100 MG tablet Take 1 tablet (100 mg total) by mouth daily as needed for erectile dysfunction.   sucralfate (CARAFATE) 1 GM/10ML suspension SHAKE LIQUID AND TAKE 10 ML(1 GRAM) BY MOUTH FOUR TIMES DAILY   temazepam (RESTORIL) 15 MG capsule TAKE 1 CAPSULE(15 MG) BY MOUTH AT BEDTIME AS NEEDED FOR SLEEP   TRULICITY 9.47 SJ/6.2EZ SOPN ADMINISTER 0.75 MG UNDER THE SKIN 1 TIME A WEEK   [DISCONTINUED] fluticasone furoate-vilanterol (BREO ELLIPTA) 100-25 MCG/ACT AEPB INHALE 1 PUFF INTO THE LUNGS DAILY   Immunization History  Administered Date(s) Administered    Influenza Inj Mdck Quad Pf 09/21/2019   Influenza, Seasonal, Injecte, Preservative Fre 11/15/2012   Influenza,inj,Quad PF,6+ Mos 10/10/2013, 08/24/2014, 09/05/2015, 09/25/2016, 10/21/2017, 10/05/2018, 09/26/2020, 10/07/2021, 09/28/2022   Influenza-Unspecified 08/24/2014   Moderna SARS-COV2 Booster Vaccination 11/05/2022   PFIZER(Purple Top)SARS-COV-2 Vaccination 03/15/2020, 04/05/2020, 10/20/2020, 05/23/2021   Pneumococcal Conjugate-13 09/25/2016   Pneumococcal Polysaccharide-23 06/05/2015   Respiratory Syncytial Virus Vaccine,Recomb Aduvanted(Arexvy) 11/05/2022   Tdap 02/23/2013   Zoster Recombinat (Shingrix) 07/21/2021, 02/06/2022   Immunization History  Administered Date(s) Administered   Influenza Inj Mdck Quad Pf 09/21/2019   Influenza, Seasonal, Injecte, Preservative Fre 11/15/2012   Influenza,inj,Quad PF,6+ Mos 10/10/2013, 08/24/2014, 09/05/2015, 09/25/2016, 10/21/2017, 10/05/2018, 09/26/2020, 10/07/2021, 09/28/2022   Influenza-Unspecified 08/24/2014   Moderna SARS-COV2 Booster Vaccination 11/05/2022   PFIZER(Purple Top)SARS-COV-2 Vaccination 03/15/2020, 04/05/2020, 10/20/2020, 05/23/2021   Pneumococcal Conjugate-13 09/25/2016   Pneumococcal Polysaccharide-23 06/05/2015   Respiratory Syncytial Virus Vaccine,Recomb Aduvanted(Arexvy) 11/05/2022   Tdap 02/23/2013   Zoster Recombinat (Shingrix) 07/21/2021, 02/06/2022      Objective:   Physical Exam BP 122/80 (BP Location: Left Arm, Cuff Size: Large)   Pulse 62   Temp 97.7 F (36.5 C)   Ht _0  (1.803 m)   Wt 223 lb (101.2 kg)   SpO2 96%   BMI 31.10 kg/m  GENERAL: Well developed well-nourished gentleman in no acute distress.  Ambulates with assistance of cane, no conversational dyspnea. HEAD: Normocephalic, atraumatic.  EYES: Pupils equal, round, reactive to light.  No scleral icterus.  MOUTH: Dentition intact, he does have some implants.  Uvula midline. NECK: Supple. No thyromegaly. Trachea midline. No JVD.  No  adenopathy. PULMONARY: Good air entry bilaterally.  No adventitious sounds.  Mild conversational dyspnea. CARDIOVASCULAR: S1 and S2. Regular rate and rhythm.  No rubs, murmurs or gallops heard. ABDOMEN: Benign. MUSCULOSKELETAL: No joint deformity, no clubbing, no edema.  NEUROLOGIC: No focal deficit, unsteady gait needs assistance from cane, speech is fluent.  SKIN: Intact,warm,dry.  No rashes. PSYCH: Mood and behavior normal.       Assessment & Plan:     ICD-10-CM   1. 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 Dyspnea on exertion  R06.09    PFTs with MIP/MEP  MIP/MEP for evaluation of neuromuscular strength    3. Mild persistent asthma without complication  A45.36 Pulmonary Function Test [facility] Only   Mild persistent asthma versus airways reactivity Improvement with Breo Continue Breo for now    4. OSA on CPAP  G47.33 Cpap titration   Difficulty tolerating due to feeling pressure is too high Sleep titration study May need AVAPS neuromuscular disease     Orders Placed This Encounter  Procedures   Pulmonary Function Test [facility] Only    Standing Status:   Future    Standing Expiration Date:   11/27/2023    Order Specific Question:   Full PFT: includes the following: basic spirometry, spirometry pre & post bronchodilator, diffusion capacity (DLCO), lung volumes    Answer:   Full PFT    Order Specific Question:   MIP/MEP    Answer:   Yes    Order Specific Question:   This test can only be performed at    Answer:   [place]   Cpap titration    May transition to BIPAP, AVAP , NIV, etc as has neuromuscular disorder.    Standing Status:   Future    Standing Expiration Date:   11/27/2023    Order Specific Question:   Where should this test be performed:    Answer:   [facility]   Meds ordered this encounter  Medications    fluticasone furoate-vilanterol (BREO ELLIPTA) 100-25 MCG/ACT AEPB    Sig: Inhale 1 puff into the lungs daily.    Dispense:  60 each    Refill:  6   We will repeat PFTs with MIP/MEP to determine about muscle strength.  He will need sleep titration study suspect that he has to transition to AVAPS given neuromuscular disease.  He feels that Memory Dance helped some so we will continue this medication for now.  Will see him in follow-up in 3 months time he is to contact us prior to that time should any new difficulties arise.  Renold Don, MD Advanced Bronchoscopy [facility]    *This note was dictated using voice recognition software/Dragon.  Despite best efforts to proofread, errors can occur which can change the meaning. Any transcriptional errors that result from this process are unintentional and may not be fully corrected at the time of dictation.

## 2022-11-27 ENCOUNTER — Ambulatory Visit: Payer: Medicaid Other | Attending: Cardiovascular Disease | Admitting: Cardiology

## 2022-11-27 ENCOUNTER — Ambulatory Visit (INDEPENDENT_AMBULATORY_CARE_PROVIDER_SITE_OTHER): Payer: Medicaid Other

## 2022-11-27 ENCOUNTER — Encounter: Payer: Self-pay | Admitting: Cardiology

## 2022-11-27 VITALS — BP 152/90 | HR 64 | Ht 71.0 in | Wt 223.8 lb

## 2022-11-27 DIAGNOSIS — I10 Essential (primary) hypertension: Secondary | ICD-10-CM

## 2022-11-27 DIAGNOSIS — E782 Mixed hyperlipidemia: Secondary | ICD-10-CM

## 2022-11-27 DIAGNOSIS — G4733 Obstructive sleep apnea (adult) (pediatric): Secondary | ICD-10-CM

## 2022-11-27 DIAGNOSIS — E785 Hyperlipidemia, unspecified: Secondary | ICD-10-CM | POA: Diagnosis not present

## 2022-11-27 DIAGNOSIS — R0602 Shortness of breath: Secondary | ICD-10-CM

## 2022-11-27 DIAGNOSIS — R002 Palpitations: Secondary | ICD-10-CM | POA: Diagnosis not present

## 2022-11-27 DIAGNOSIS — R0609 Other forms of dyspnea: Secondary | ICD-10-CM | POA: Diagnosis not present

## 2022-11-27 DIAGNOSIS — I2510 Atherosclerotic heart disease of native coronary artery without angina pectoris: Secondary | ICD-10-CM | POA: Diagnosis not present

## 2022-11-27 MED ORDER — EZETIMIBE 10 MG PO TABS: 10.0000 mg | ORAL_TABLET | Freq: Every day | ORAL | 3 refills | Status: DC

## 2022-11-27 NOTE — Patient Instructions (Addendum)
Medication Instructions:  RESTART Ezetimibe (Zetia) 10 mg once daily  *If you need a refill on your cardiac medications before your next appointment, please call your pharmacy*   Lab Work: Your provider would like for you to return in 3 months to have the following labs drawn: fasting Lipid and Liver.   Please go to the [facility] entrance and check in at the front desk.  You do not need an appointment.  They are open from 7am-6 pm.  You will need to be fasting.   If you have labs (blood work) drawn today and your tests are completely normal, you will receive your results only by: [place] (if you have MyChart) OR A paper copy in the mail If you have any lab test that is abnormal or we need to change your treatment, we will call you to review the results.   Testing/Procedures: Your physician has requested that you have an echocardiogram. Echocardiography is a painless test that uses sound waves to create images of your heart. It provides your doctor with information about the size and shape of your heart and how well your heart's chambers and valves are working. You may receive an ultrasound enhancing agent through an IV if needed to better visualize your heart during the echo.This procedure takes approximately one hour. There are no restrictions for this procedure.   Follow-Up: At [facility], you and your health needs are our priority.  As part of our continuing mission to provide you with exceptional heart care, we have created designated Provider Care Teams.  These Care Teams include your primary Cardiologist (physician) and Advanced Practice Providers (APPs -  Physician Assistants and Nurse Practitioners) who all work together to provide you with the care you need, when you need it.  We recommend signing up for the patient portal called "MyChart".  Sign up information is provided on this After Visit Summary.  MyChart is used to connect with patients for Virtual  Visits (Telemedicine).  Patients are able to view lab/test results, encounter notes, upcoming appointments, etc.  Non-urgent messages can be sent to your provider as well.   To learn more about what you can do with MyChart, go to [email or web address].    Your next appointment:   Follow up with Dr. Fletcher Anon or APP after testing  Other Instructions ZIO XT- Long Term Monitor Instructions  Your physician has requested you wear a ZIO patch monitor for 14 days.  This is a single patch monitor. [employer] supplies one patch monitor per enrollment. Additional stickers are not available. Please do not apply patch if you will be having a Nuclear Stress Test,  Echocardiogram, Cardiac CT, MRI, or Chest Xray during the period you would be wearing the  monitor. The patch cannot be worn during these tests. You cannot remove and re-apply the  ZIO XT patch monitor.  Your ZIO patch monitor will be mailed 3 day USPS to your address on file. It may take 3-5 days  to receive your monitor after you have been enrolled.  Once you have received your monitor, please review the enclosed instructions. Your monitor  has already been registered assigning a specific monitor serial # to you.  Billing and Patient Assistance Program Information  We have supplied [employer] with any of your insurance information on file for billing purposes. [employer] offers a sliding scale Patient Assistance Program for patients that do not have  insurance, or whose insurance does not completely cover the cost of the ZIO  monitor.  You must apply for the Patient Assistance Program to qualify for this discounted rate.  To apply, please call [employer] at [phone], select option 4, select option 2, ask to apply for  Patient Assistance Program. Theodore Demark will ask your household income, and how many people  are in your household. They will quote your out-of-pocket cost based on that information.  [employer] will also be able to set up a 55-month  interest-free payment plan if needed.  Applying the monitor   Shave hair from upper left chest.  Hold abrader disc by orange tab. Rub abrader in 40 strokes over the upper left chest as  indicated in your monitor instructions.  Clean area with 4 enclosed alcohol pads. Let dry.  Apply patch as indicated in monitor instructions. Patch will be placed under collarbone on left  side of chest with arrow pointing upward.  Rub patch adhesive wings for 2 minutes. Remove white label marked "1". Remove the white  label marked "2". Rub patch adhesive wings for 2 additional minutes.  While looking in a mirror, press and release button in center of patch. A small green light will  flash 3-4 times. This will be your only indicator that the monitor has been turned on.  Do not shower for the first 24 hours. You may shower after the first 24 hours.  Press the button if you feel a symptom. You will hear a small click. Record Date, Time and  Symptom in the Patient Logbook.  When you are ready to remove the patch, follow instructions on the last 2 pages of Patient  Logbook. Stick patch monitor onto the last page of Patient Logbook.  Place Patient Logbook in the blue and white box. Use locking tab on box and tape box closed  securely. The blue and white box has prepaid postage on it. Please place it in the mailbox as  soon as possible. Your physician should have your test results approximately 7 days after the  monitor has [facility]  Call I[facility] [phone] you have questions regarding  your ZIO XT patch monitor. Call them immediately if you see an orange light blinking on your  monitor.  If your monitor falls off in less than 4 days, contact our Monitor department at [phone]  If your monitor becomes loose or falls off after 4 days call [employer] at [phone]  suggestions on securing your monitor

## 2022-11-30 DIAGNOSIS — E7140 Disorder of carnitine metabolism, unspecified: Secondary | ICD-10-CM | POA: Diagnosis not present

## 2022-11-30 DIAGNOSIS — R531 Weakness: Secondary | ICD-10-CM | POA: Diagnosis not present

## 2022-11-30 DIAGNOSIS — G713 Mitochondrial myopathy, not elsewhere classified: Secondary | ICD-10-CM | POA: Diagnosis not present

## 2022-11-30 DIAGNOSIS — I10 Essential (primary) hypertension: Secondary | ICD-10-CM | POA: Diagnosis not present

## 2022-11-30 DIAGNOSIS — Z7183 Encounter for nonprocreative genetic counseling: Secondary | ICD-10-CM | POA: Diagnosis not present

## 2022-12-02 ENCOUNTER — Encounter: Payer: Self-pay | Admitting: Pulmonary Disease

## 2022-12-02 NOTE — Telephone Encounter (Signed)
[name] [name], can you assist with this?

## 2022-12-02 NOTE — Telephone Encounter (Signed)
I called [facility] and explained what the patient told us. Then I called the patient and gave him the correct phone # [phone]

## 2022-12-03 ENCOUNTER — Other Ambulatory Visit: Payer: Self-pay | Admitting: Family Medicine

## 2022-12-04 ENCOUNTER — Other Ambulatory Visit: Payer: Self-pay

## 2022-12-04 ENCOUNTER — Telehealth: Payer: Self-pay

## 2022-12-04 ENCOUNTER — Encounter: Payer: Self-pay | Admitting: Family Medicine

## 2022-12-04 NOTE — Telephone Encounter (Signed)
Spoke with Hydrologist and he stated they have #75 on hold for patient and he can pick up using [facility] at his convenience. Patient notified and in agreement to keep RX at [facility]. Insurance only covers #15 at a time. Patient to pay out of pocket for the remainder 90 days supply if wanted.

## 2022-12-05 DIAGNOSIS — R002 Palpitations: Secondary | ICD-10-CM | POA: Diagnosis not present

## 2022-12-14 DIAGNOSIS — G713 Mitochondrial myopathy, not elsewhere classified: Secondary | ICD-10-CM | POA: Diagnosis not present

## 2022-12-17 ENCOUNTER — Encounter: Payer: Self-pay | Admitting: Family Medicine

## 2022-12-24 NOTE — Progress Notes (Signed)
Overall normal monitor. Normal rhythm with rare early beats noted and no arrhythmia to explain increased palpitations.

## 2022-12-30 NOTE — Progress Notes (Unsigned)
Name: [name] [name]   MRN: [id number]    DOB: 09-Oct-1961   Date:12/31/2022       Progress Note  Subjective  Chief Complaint  Stomach Problems  HPI  DMII: he is taking [place] and Trulicity, Y6O has been to goal but he continues to have bloating, we will stop Trulicity again. He denies polyphagia, polydipsia or polyuria  History of gastritis: seen by Dr. Marius Ditch, advised to continue omeprazole once a day, only taking sucralfate prn, explained bloating likely from Trulicity and we will try stop taking  History of right inguinal hernia repair years ago, over the past few weeks he noticed a constant pain on right lower quadrant , tender to touch, worse when he bears down, radiates to right testicle intermittently, no fever . Denies nausea or vomiting.    Mitochondrial disease: had recent labs done and told low vitamin D, we will give him a rx to take weekly and to not take sucralfate at the same time. Still using a cane, gained some weight.   Patient Active Problem List   Diagnosis Date Noted   Neurogenic muscular atrophy 05/08/2022   Other insomnia 05/08/2022   Controlled type 2 diabetes mellitus with microalbuminuria, without long-term current use of insulin ([place]) 05/08/2022   Contraindication to statin medication 05/08/2022   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[place]) 01/22/2022   Tremor 01/22/2022   Shortness of breath 01/22/2022   Muscle atrophy of lower extremity 11/10/2021   Dysphagia    Asthma 05/14/2021   Multiple thyroid nodules 05/14/2021   Localized, primary osteoarthritis of hand 09/28/2019   Seizure-like activity ([place]) 02/22/2018   Mild cognitive impairment 11/17/2017   Radiculopathy of cervical region 11/17/2017   Stenosis of carotid artery 09/08/2017   Cervical stenosis of spine 09/08/2017   B12 deficiency 06/28/2017   Headache disorder 02/15/2017   Memory loss or impairment 02/15/2017   Coronary artery disease involving native coronary artery of native heart    Radiculitis of  left cervical region 03/05/2016   Diabetic neuropathy associated with type 2 diabetes mellitus ([place]) 12/05/2015   Patellar subluxation 06/05/2015   Allergic rhinitis, seasonal 06/05/2015   Chronic constipation 06/05/2015   Chronic lower back pain 06/05/2015   Vitamin D deficiency 06/05/2015   Central sleep apnea 06/05/2015   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   Nerve root pain 06/05/2015   Acquired polycythemia 06/05/2015   Anterior knee pain 06/05/2015   Failure of erection 06/05/2015   Gastro-esophageal reflux disease without esophagitis 06/05/2015   Neuropathy 06/05/2015   Angina, class III ([place]) 05/23/2015   Essential hypertension 05/23/2015   Hyperlipidemia 05/23/2015   Inguinal hernia without mention of obstruction or gangrene, recurrent unilateral or unspecified 03/24/2013    Past Surgical History:  Procedure Laterality Date   BACK SURGERY  12/2008   X2-LUMBAR   CARDIAC CATHETERIZATION N/A 05/30/2015   Procedure: Left Heart Cath;  Surgeon: Wellington Hampshire, MD;  Location: [place] [facility];  Service: Cardiovascular;  Laterality: N/A;   CARDIAC CATHETERIZATION N/A 04/16/2016   Procedure: Left Heart Cath and Coronary Angiography;  Surgeon: Wellington Hampshire, MD;  Location: [place] [facility];  Service: Cardiovascular;  Laterality: N/A;   COLONOSCOPY  2014   Dr. [name]   COLONOSCOPY WITH PROPOFOL N/A 12/25/2016   Procedure: COLONOSCOPY WITH PROPOFOL;  Surgeon: Jonathon Bellows, MD;  Location: [facility];  Service: Endoscopy;  Laterality: N/A;   COLONOSCOPY WITH PROPOFOL N/A 01/29/2017   Procedure: COLONOSCOPY WITH PROPOFOL;  Surgeon: Jonathon Bellows, MD;  Location: [place] [facility];  Service: Endoscopy;  Laterality: N/A;   COLONOSCOPY WITH PROPOFOL N/A 04/11/2020   Procedure: COLONOSCOPY WITH PROPOFOL;  Surgeon: Jonathon Bellows, MD;  [facility];  Service: Gastroenterology;  Laterality: N/A;   ESOPHAGEAL MANOMETRY N/A 03/04/2022   Procedure: ESOPHAGEAL MANOMETRY (EM);  Surgeon: Mauri Pole, MD;  Location: [facility] ENDOSCOPY;  Service: Gastroenterology;  Laterality: N/A;   ESOPHAGOGASTRODUODENOSCOPY (EGD) WITH PROPOFOL N/A 01/26/2019   Procedure: ESOPHAGOGASTRODUODENOSCOPY (EGD) WITH PROPOFOL;  Surgeon: Lin Landsman, MD;  Location: [facility];  Service: Gastroenterology;  Laterality: N/A;   ESOPHAGOGASTRODUODENOSCOPY (EGD) WITH PROPOFOL N/A 09/10/2021   Procedure: ESOPHAGOGASTRODUODENOSCOPY (EGD) WITH PROPOFOL;  Surgeon: Lin Landsman, MD;  Location: [facility] ENDOSCOPY;  Service: Gastroenterology;  Laterality: N/A;   EVALUATION UNDER ANESTHESIA WITH HEMORRHOIDECTOMY N/A 02/24/2017   Procedure: EXAM UNDER ANESTHESIA WITH POSSIBLE EXCISION OF INTERNAL HEMORRHOIDS;  Surgeon: Olean Ree, MD;  Location: [facility];  Service: General;  Laterality: N/A;   FISSURECTOMY  02/24/2017   Procedure: FISSURECTOMY;  Surgeon: Olean Ree, MD;  Location: [facility];  Service: General;;   HAND SURGERY Left 2020   HERNIA REPAIR Right 1991   INGUINAL HERNIA REPAIR Right 2012   Dr Teryl Lucy HERNIA REPAIR Right 2014   Dr [name]   INTRAVASCULAR PRESSURE WIRE/FFR STUDY N/A 07/30/2021   Procedure: INTRAVASCULAR PRESSURE WIRE/FFR STUDY;  Surgeon: Wellington Hampshire, MD;  Location: [place] [facility];  Service: Cardiovascular;  Laterality: N/A;   LEFT HEART CATH AND CORONARY ANGIOGRAPHY Left 08/05/2020   Procedure: LEFT HEART CATH AND CORONARY ANGIOGRAPHY poss PCI;  Surgeon: Wellington Hampshire, MD;  Location: [place] [facility];  Service: Cardiovascular;  Laterality: Left;   MUSCLE BIOPSY Left 12/31/2021   Procedure: LEFT VASTUS LATERALIS BIOPSY;  Surgeon: Meade Maw, MD;  Location: [facility];  Service: Neurosurgery;  Laterality: Left;   RIGHT/LEFT HEART CATH AND CORONARY ANGIOGRAPHY N/A 07/30/2021   Procedure: RIGHT/LEFT HEART CATH AND CORONARY ANGIOGRAPHY;  Surgeon: Wellington Hampshire, MD;  Location: [place] [facility];  Service: Cardiovascular;  Laterality: N/A;    Family History   Problem Relation Age of Onset   Hypertension Mother    Heart Problems Mother    High Cholesterol Mother    Heart Problems Father 39       myocardial infarction   Mental illness Brother    Other Maternal Aunt        COVID   Lung cancer Maternal Aunt    Lung cancer Maternal Aunt    Cancer Maternal Uncle        unknown   Prostate cancer Maternal Uncle    Testicular cancer Paternal Uncle    Thyroid disease Daughter    Breast cancer Cousin     Social History   Tobacco Use   Smoking status: Former    Types: Cigars    Start date: 12/28/2002    Quit date: 06/04/2005    Years since quitting: 17.5   Smokeless tobacco: Never   Tobacco comments:    quit 2006-smoked 1 cigar occ  Substance Use Topics   Alcohol use: Not Currently    Comment: has not had a drink in 8 months 02/2021     Current Outpatient Medications:    ACCU-CHEK GUIDE test strip, USE TO TEST BLOOD SUGAR THREE TIMES DAILY AS NEEDED, Disp: 100 strip, Rfl: 1   Accu-Chek Softclix Lancets lancets, TEST TWICE DAILY, Disp: 100 each, Rfl: 3   Alpha Lipoic Acid 200 MG CAPS, Take 200 mg by  mouth daily., Disp: , Rfl:    blood glucose meter kit and supplies, 1 each by Other route in the morning and at bedtime. Dispense based on patient and insurance preference. Use up to two  times daily. (FOR ICD-10 E10.9, E11.9)., Disp: 1 each, Rfl: 0   carvedilol (COREG) 6.25 MG tablet, TAKE 1 TABLET(6.25 MG) BY MOUTH TWICE DAILY, Disp: 180 tablet, Rfl: 1   Coenzyme Q10 (COQ-10) 100 MG CAPS, Take 100 mg by mouth daily., Disp: , Rfl:    ezetimibe (ZETIA) 10 MG tablet, Take 1 tablet (10 mg total) by mouth daily., Disp: 90 tablet, Rfl: 3   FARXIGA 10 MG TABS tablet, Take 1 tablet (10 mg total) by mouth daily., Disp: 30 tablet, Rfl: 3   fluticasone (FLONASE) 50 MCG/ACT nasal spray, Place 1 spray into both nostrils daily as needed for allergies or rhinitis., Disp: , Rfl:    fluticasone furoate-vilanterol (BREO ELLIPTA) 100-25 MCG/ACT AEPB, Inhale 1  puff into the lungs daily., Disp: 60 each, Rfl: 6   ketoconazole (NIZORAL) 2 % shampoo, Apply 1 Application topically 3 (three) times a week. Wash scalp/body 3 times a week as needed for alres, let sit 5 minutes before rinsing off, avoid eyes. May use once monthly for maintenance., Disp: 120 mL, Rfl: 11   levOCARNitine (CARNITOR) 330 MG tablet, Take 330 mg by mouth 2 (two) times daily., Disp: , Rfl:    losartan-hydrochlorothiazide (HYZAAR) 50-12.5 MG tablet, TAKE 1 TABLET BY MOUTH DAILY, Disp: 90 tablet, Rfl: 2   mupirocin ointment (BACTROBAN) 2 %, Apply 1 application topically daily. Qd to excision site (Patient taking differently: Apply 1 application  topically daily as needed (skin irritation.). Qd to excision site), Disp: 22 g, Rfl: 1   nitroGLYCERIN (NITROSTAT) 0.4 MG SL tablet, Place 1 tablet (0.4 mg total) under the tongue every 5 (five) minutes as needed for chest pain., Disp: , Rfl:    omeprazole (PRILOSEC) 40 MG capsule, TAKE 1 CAPSULE(40 MG) BY MOUTH TWICE DAILY BEFORE A MEAL, Disp: 60 capsule, Rfl: 5   pregabalin (LYRICA) 50 MG capsule, Take 50 mg by mouth daily., Disp: , Rfl:    ranolazine (RANEXA) 1000 MG SR tablet, Take 1 tablet (1,000 mg total) by mouth 2 (two) times daily., Disp: 180 tablet, Rfl: 3   Riboflavin (B-2) 100 MG TABS, Take 100 mg by mouth daily., Disp: , Rfl:    sildenafil (VIAGRA) 100 MG tablet, Take 1 tablet (100 mg total) by mouth daily as needed for erectile dysfunction., Disp: 30 tablet, Rfl: 1   sucralfate (CARAFATE) 1 GM/10ML suspension, SHAKE LIQUID AND TAKE 10 ML(1 GRAM) BY MOUTH FOUR TIMES DAILY, Disp: 420 mL, Rfl: 1   temazepam (RESTORIL) 15 MG capsule, TAKE 1 CAPSULE(15 MG) BY MOUTH AT BEDTIME AS NEEDED FOR SLEEP, Disp: 90 capsule, Rfl: 0   TRULICITY 4.74 QV/9.5GL SOPN, ADMINISTER 0.75 MG UNDER THE SKIN 1 TIME A WEEK, Disp: 6 mL, Rfl: 0  Allergies  Allergen Reactions   Gabapentin Other (See Comments)    Groggy-Mood Changes   Latex Rash   Penicillins Hives  and Rash    Has patient had a PCN reaction causing immediate rash, facial/tongue/throat swelling, SOB or lightheadedness with hypotension: Yes Has patient had a PCN reaction causing severe rash involving mucus membranes or skin necrosis: No Has patient had a PCN reaction that required hospitalization No Has patient had a PCN reaction occurring within the last 10 years: No If all of the above answers are "NO", then may  proceed with Cephalosporin use.     I personally reviewed active problem list, medication list, allergies, family history, social history, health maintenance with the patient/caregiver today.   ROS  Ten systems reviewed and is negative except as mentioned in HPI   Objective  Vitals:   12/31/22 1006  BP: 128/74  Pulse: 85  Resp: 16  SpO2: 95%  Weight: 226 lb (102.5 kg)  Height: _0  (1.803 m)    Body mass index is 31.52 kg/m.  Physical Exam  Constitutional: Patient appears well-developed and well-nourished. Obese  No distress.  HEENT: head atraumatic, normocephalic, pupils equal and reactive to light, neck supple Cardiovascular: Normal rate, regular rhythm and normal heart sounds.  No murmur heard. No BLE edema. Pulmonary/Chest: Effort normal and breath sounds normal. No respiratory distress. Abdominal: Soft.  There is mild epigastric pain, pain during palpation of right inguinal scar Psychiatric: Patient has a normal mood and affect. behavior is normal. Judgment and thought content normal.   Recent Results (from the past 2160 hour(s))  POCT HgB A1C     Status: None   Collection Time: 11/04/22 10:21 AM  Result Value Ref Range   Hemoglobin A1C 5.4 4.0 - 5.6 %   HbA1c POC (<> result, manual entry)     HbA1c, POC (prediabetic range)     HbA1c, POC (controlled diabetic range)    Lipid Profile     Status: Abnormal   Collection Time: 11/04/22 11:06 AM  Result Value Ref Range   Cholesterol 236 (H) <200 mg/dL   HDL 46 > OR = 40 mg/dL   Triglycerides 95 <150  mg/dL   LDL Cholesterol (Calc) 169 (H) mg/dL (calc)    Comment: Reference range: <100 . Desirable range <100 mg/dL for primary prevention;   <70 mg/dL for patients with CHD or diabetic patients  with > or = 2 CHD risk factors. Marland Kitchen LDL-C is now calculated using the Martin-Hopkins  calculation, which is a validated novel method providing  better accuracy than the Friedewald equation in the  estimation of LDL-C.  Cresenciano Genre et al. Annamaria Helling. 9476;546(50): 2061-2068  (http://education.[employer].com/faq/FAQ164)    Total CHOL/HDL Ratio 5.1 (H) <5.0 (calc)   Non-HDL Cholesterol (Calc) 190 (H) <130 mg/dL (calc)    Comment: For patients with diabetes plus 1 major ASCVD risk  factor, treating to a non-HDL-C goal of <100 mg/dL  (LDL-C of <70 mg/dL) is considered a therapeutic  option.   COMPLETE METABOLIC PANEL WITH GFR     Status: None   Collection Time: 11/04/22 11:06 AM  Result Value Ref Range   Glucose, Bld 87 65 - 99 mg/dL    Comment: .            Fasting reference interval .    BUN 8 7 - 25 mg/dL   Creat 0.71 0.70 - 1.35 mg/dL   eGFR 105 > OR = 60 mL/min/1.65m   BUN/Creatinine Ratio SEE NOTE: 6 - 22 (calc)    Comment:    Not Reported: BUN and Creatinine are within    reference range. .    Sodium 142 135 - 146 mmol/L   Potassium 3.6 3.5 - 5.3 mmol/L   Chloride 104 98 - 110 mmol/L   CO2 29 20 - 32 mmol/L   Calcium 9.4 8.6 - 10.3 mg/dL   Total Protein 7.1 6.1 - 8.1 g/dL   Albumin 4.4 3.6 - 5.1 g/dL   Globulin 2.7 1.9 - 3.7 g/dL (calc)   AG Ratio 1.6 1.0 -  2.5 (calc)   Total Bilirubin 0.6 0.2 - 1.2 mg/dL   Alkaline phosphatase (APISO) 50 35 - 144 U/L   AST 12 10 - 35 U/L   ALT 14 9 - 46 U/L  CBC with Differential/Platelet     Status: Abnormal   Collection Time: 11/04/22 11:06 AM  Result Value Ref Range   WBC 8.0 3.8 - 10.8 Thousand/uL   RBC 5.87 (H) 4.20 - 5.80 Million/uL   Hemoglobin 18.2 (H) 13.2 - 17.1 g/dL   HCT 53.7 (H) 38.5 - 50.0 %   MCV 91.5 80.0 - 100.0 fL    MCH 31.0 27.0 - 33.0 pg   MCHC 33.9 32.0 - 36.0 g/dL   RDW 12.7 11.0 - 15.0 %   Platelets 229 140 - 400 Thousand/uL   MPV 10.1 7.5 - 12.5 fL   Neutro Abs 4,136 1,500 - 7,800 cells/uL   Lymphs Abs 3,096 850 - 3,900 cells/uL   Absolute Monocytes 624 200 - 950 cells/uL   Eosinophils Absolute 104 15 - 500 cells/uL   Basophils Absolute 40 0 - 200 cells/uL   Neutrophils Relative % 51.7 %   Total Lymphocyte 38.7 %   Monocytes Relative 7.8 %   Eosinophils Relative 1.3 %   Basophils Relative 0.5 %  Microalbumin / creatinine urine ratio     Status: Abnormal   Collection Time: 11/04/22 11:06 AM  Result Value Ref Range   Creatinine, Urine 98 20 - 320 mg/dL   Microalb, Ur 4.3 mg/dL    Comment: Reference Range Not established    Microalb Creat Ratio 44 (H) <30 mcg/mg creat    Comment: . The ADA defines abnormalities in albumin excretion as follows: Marland Kitchen Albuminuria Category        Result (mcg/mg creatinine) . Normal to Mildly increased   <30 Moderately increased         30-299  Severely increased           > OR = 300 . The ADA recommends that at least two of three specimens collected within a 3-6 month period be abnormal before considering a patient to be within a diagnostic category.     PHQ2/9:    12/31/2022   10:06 AM 11/04/2022   10:20 AM 07/29/2022    8:36 AM 05/08/2022    8:29 AM 02/26/2022   10:46 AM  Depression screen PHQ 2/9  Decreased Interest 0 0 0 0 0  Down, Depressed, Hopeless 0 0 0 0 0  PHQ - 2 Score 0 0 0 0 0  Altered sleeping 0 0  0 0  Tired, decreased energy 0 0  0 0  Change in appetite 0 0  0 0  Feeling bad or failure about yourself  0 0  0 0  Trouble concentrating 0 0  0 0  Moving slowly or fidgety/restless 0 0  0 0  Suicidal thoughts 0 0  0 0  PHQ-9 Score 0 0  0 0    phq 9 is negative   Fall Risk:    12/31/2022   10:05 AM 11/04/2022   10:20 AM 07/29/2022    8:36 AM 05/08/2022    8:29 AM 02/26/2022   10:46 AM  Fall Risk   Falls in the past year? 1 1 0 0 0   Number falls in past yr: 0 1 0 0 0  Injury with Fall? 0 0 0 0 0  Risk for fall due to : Impaired balance/gait Impaired balance/gait No Fall Risks  No Fall Risks No Fall Risks  Follow up _0       Functional Status Survey: Is the patient deaf or have difficulty hearing?: No Does the patient have difficulty seeing, even when wearing glasses/contacts?: No Does the patient have difficulty concentrating, remembering, or making decisions?: No Does the patient have difficulty walking or climbing stairs?: Yes Does the patient have difficulty dressing or bathing?: No Does the patient have difficulty doing errands alone such as visiting a doctor's office or shopping?: No    Assessment & Plan  1. Dyslipidemia associated with type 2 diabetes mellitus ([place])  Stop Trulicity, continue Farxiga   2. 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[place])  Keep follow up with [facility]   3. History of gastritis  Taking omeprazole once a day and sucralfate prn , at most once a week   4. Bloating  Stop trulicity now   5. Vitamin D deficiency  - Vitamin D, Ergocalciferol, (DRISDOL) 1.25 MG (50000 UNIT) CAPS capsule; Take 1 capsule (50,000 Units total) by mouth every 7 (seven) days.  Dispense: 12 capsule; Refill: 1  6. History of right inguinal hernia repair  - Ambulatory referral to General Surgery  7. Continuous right lower quadrant pain  - Ambulatory referral to General Surgery

## 2022-12-31 ENCOUNTER — Ambulatory Visit: Payer: Medicaid Other | Admitting: Family Medicine

## 2022-12-31 ENCOUNTER — Encounter: Payer: Self-pay | Admitting: Family Medicine

## 2022-12-31 VITALS — BP 128/74 | HR 85 | Resp 16 | Ht 71.0 in | Wt 226.0 lb

## 2022-12-31 DIAGNOSIS — Z9889 Other specified postprocedural states: Secondary | ICD-10-CM | POA: Diagnosis not present

## 2022-12-31 DIAGNOSIS — Z8719 Personal history of other diseases of the digestive system: Secondary | ICD-10-CM

## 2022-12-31 DIAGNOSIS — R140 Abdominal distension (gaseous): Secondary | ICD-10-CM

## 2022-12-31 DIAGNOSIS — R1031 Right lower quadrant pain: Secondary | ICD-10-CM | POA: Diagnosis not present

## 2022-12-31 DIAGNOSIS — E785 Hyperlipidemia, unspecified: Secondary | ICD-10-CM | POA: Diagnosis not present

## 2022-12-31 DIAGNOSIS — E559 Vitamin D deficiency, unspecified: Secondary | ICD-10-CM | POA: Diagnosis not present

## 2022-12-31 DIAGNOSIS — E8840 Mitochondrial metabolism disorder, unspecified: Secondary | ICD-10-CM

## 2022-12-31 DIAGNOSIS — E1169 Type 2 diabetes mellitus with other specified complication: Secondary | ICD-10-CM | POA: Diagnosis not present

## 2022-12-31 MED ORDER — VITAMIN D (ERGOCALCIFEROL) 1.25 MG (50000 UNIT) PO CAPS: 50000.0000 [IU] | ORAL_CAPSULE | ORAL | 1 refills | Status: DC

## 2023-01-01 ENCOUNTER — Ambulatory Visit: Payer: Medicaid Other | Admitting: Family Medicine

## 2023-01-03 ENCOUNTER — Other Ambulatory Visit: Payer: Self-pay | Admitting: Cardiovascular Disease

## 2023-01-06 ENCOUNTER — Ambulatory Visit: Payer: Medicaid Other | Attending: Otolaryngology

## 2023-01-06 DIAGNOSIS — I10 Essential (primary) hypertension: Secondary | ICD-10-CM | POA: Insufficient documentation

## 2023-01-06 DIAGNOSIS — K219 Gastro-esophageal reflux disease without esophagitis: Secondary | ICD-10-CM | POA: Diagnosis not present

## 2023-01-06 DIAGNOSIS — J449 Chronic obstructive pulmonary disease, unspecified: Secondary | ICD-10-CM | POA: Insufficient documentation

## 2023-01-06 DIAGNOSIS — E119 Type 2 diabetes mellitus without complications: Secondary | ICD-10-CM | POA: Insufficient documentation

## 2023-01-06 DIAGNOSIS — Z7984 Long term (current) use of oral hypoglycemic drugs: Secondary | ICD-10-CM | POA: Diagnosis not present

## 2023-01-06 DIAGNOSIS — Z79899 Other long term (current) drug therapy: Secondary | ICD-10-CM | POA: Insufficient documentation

## 2023-01-06 DIAGNOSIS — J45909 Unspecified asthma, uncomplicated: Secondary | ICD-10-CM | POA: Insufficient documentation

## 2023-01-06 DIAGNOSIS — E785 Hyperlipidemia, unspecified: Secondary | ICD-10-CM | POA: Insufficient documentation

## 2023-01-06 DIAGNOSIS — G4733 Obstructive sleep apnea (adult) (pediatric): Secondary | ICD-10-CM | POA: Insufficient documentation

## 2023-01-06 DIAGNOSIS — R0602 Shortness of breath: Secondary | ICD-10-CM | POA: Diagnosis not present

## 2023-01-06 DIAGNOSIS — R0683 Snoring: Secondary | ICD-10-CM | POA: Diagnosis not present

## 2023-01-06 DIAGNOSIS — E669 Obesity, unspecified: Secondary | ICD-10-CM | POA: Insufficient documentation

## 2023-01-06 DIAGNOSIS — G4761 Periodic limb movement disorder: Secondary | ICD-10-CM | POA: Diagnosis not present

## 2023-01-06 DIAGNOSIS — Z6834 Body mass index (BMI) 34.0-34.9, adult: Secondary | ICD-10-CM | POA: Insufficient documentation

## 2023-01-08 ENCOUNTER — Encounter: Payer: Self-pay | Admitting: Surgery

## 2023-01-08 ENCOUNTER — Ambulatory Visit: Payer: Medicaid Other | Admitting: Surgery

## 2023-01-08 ENCOUNTER — Other Ambulatory Visit: Payer: Self-pay | Admitting: Family Medicine

## 2023-01-08 VITALS — BP 167/117 | HR 64 | Temp 98.2°F | Ht 71.0 in | Wt 225.2 lb

## 2023-01-08 DIAGNOSIS — R1031 Right lower quadrant pain: Secondary | ICD-10-CM

## 2023-01-08 NOTE — Progress Notes (Unsigned)
01/08/2023  Reason for Visit:  Right groin pain  Requesting Provider:  Steele Sizer, MD  History of Present Illness: [name] [name] is a 62 y.o. male presenting for evaluation of right groin pain.  The patient has a history of two open right inguina hernia repairs with Dr. [name] in 2012 and in 2014.  He reports that he's been having right groin pain that comes and goes, but over the past few months it has been more consistent.  He denies any pain in the left groin or at the umbilicus.  He reports pain when he's sneezing or coughing or when straining.  He has not had any recent imaging of the groin area, but on his last CT scan in 2022 there was no evidence of hernia recurrence.  Denies any nausea, vomiting or other areas of abdominal pain.  Past Medical History: Past Medical History:  Diagnosis Date   Allergy    Angina, class III (HCC)    Chronic constipation    Degeneration of intervertebral disc of cervical region    Diabetes mellitus without complication (HCC)    Diastolic dysfunction    a. 06/2017 Echo: EF 60-65%, no rwma, Gr1 DD.   Dyspnea    GERD (gastroesophageal reflux disease)    Hernia 1991   02/10/2012-RIH repair   Hyperlipidemia    Hypertension 2008   Nerve root pain    Neuropathy    Non-obstructive CAD (coronary artery disease)    a. 05/30/2015 cath: LM nl, mLAD 50% (FFR 0.83), LCx minor irregs, RCA minor irregs, EF 55-65%-->Med Rx; b. 03/2016 Cath: LM nl LAD 28m D1/2/3 min irregs, LCX min irregs, OM1/2/3 nl, RCA min irregs, RPDA/RPAV/RPL1/RPL2 nl-->Med Rx; b. 07/2020 Cath: LM nl, LAD 368mD1/2/3 min irregs, LCX min irregs, RCA min irregs, RPDA/RPAV/RPL1,2 nl, EF 55-65%.   Obesity, unspecified 2012   Personal history of tobacco use, presenting hazards to health 2012   Polycythemia    Rectal bleeding 02/11/2017   [facility]urrent Right Inguinal Hernia Repair 02/09/2011, [facility]07/2013   [facility] Shortness of breath    Sleep apnea    a. On CPAP;  b. 06/2017 Echo: no PAH.    Umbilical hernia without mention of obstruction or gangrene 02/09/2011   [facility] Vitamin D deficiency      Past Surgical History: Past Surgical History:  Procedure Laterality Date   BACK SURGERY  12/2008   [facility] CARDIAC CATHETERIZATION N/A 05/30/2015   Procedure: Left Heart Cath;  Surgeon: MuWellington HampshireMD;  [facility];  Service: Cardiovascular;  Laterality: N/A;   CARDIAC CATHETERIZATION N/A 04/16/2016   Procedure: Left Heart Cath and Coronary Angiography;  Surgeon: MuWellington HampshireMD;  [facility];  Service: Cardiovascular;  Laterality: N/A;   COLONOSCOPY  2014   Dr. SaJamal Collin COLONOSCOPY WITH PROPOFOL N/A 12/25/2016   Procedure: COLONOSCOPY WITH PROPOFOL;  Surgeon: KiJonathon BellowsMD;  Location: [facility] ENDOSCOPY;  Service: Endoscopy;  Laterality: N/A;   COLONOSCOPY WITH PROPOFOL N/A 01/29/2017   Procedure: COLONOSCOPY WITH PROPOFOL;  Surgeon: KiJonathon BellowsMD;  Location: [facility] ENDOSCOPY;  Service: Endoscopy;  Laterality: N/A;   COLONOSCOPY WITH PROPOFOL N/A 04/11/2020   Procedure: COLONOSCOPY WITH PROPOFOL;  Surgeon: AnJonathon BellowsMD;  [facility];  Service: Gastroenterology;  Laterality: N/A;   ESOPHAGEAL MANOMETRY N/A 03/04/2022   Procedure: ESOPHAGEAL MANOMETRY (EM);  Surgeon: NaMauri PoleMD;  Location: [facility] ENDOSCOPY;  Service: Gastroenterology;  Laterality: N/A;   ESOPHAGOGASTRODUODENOSCOPY (EGD) WITH PROPOFOL N/A 01/26/2019   Procedure: ESOPHAGOGASTRODUODENOSCOPY (EGD) WITH PROPOFOL;  Surgeon: Lin Landsman, MD;  Location: [facility] ENDOSCOPY;  Service: Gastroenterology;  Laterality: N/A;   ESOPHAGOGASTRODUODENOSCOPY (EGD) WITH PROPOFOL N/A 09/10/2021   Procedure: ESOPHAGOGASTRODUODENOSCOPY (EGD) WITH PROPOFOL;  Surgeon: Lin Landsman, MD;  Location: [facility] ENDOSCOPY;  Service: Gastroenterology;  Laterality: N/A;   EVALUATION UNDER ANESTHESIA WITH HEMORRHOIDECTOMY N/A 02/24/2017   Procedure: EXAM UNDER ANESTHESIA  WITH POSSIBLE EXCISION OF INTERNAL HEMORRHOIDS;  Surgeon: Olean Ree, MD;  Location: [facility] ORS;  Service: General;  Laterality: N/A;   FISSURECTOMY  02/24/2017   Procedure: FISSURECTOMY;  Surgeon: Olean Ree, MD;  Location: [facility] ORS;  Service: General;;   HAND SURGERY Left 2020   HERNIA REPAIR Right 1991   INGUINAL HERNIA REPAIR Right 2012   Dr Teryl Lucy HERNIA REPAIR Right 2014   Dr [name]   INTRAVASCULAR PRESSURE WIRE/FFR STUDY N/A 07/30/2021   Procedure: INTRAVASCULAR PRESSURE WIRE/FFR STUDY;  Surgeon: Wellington Hampshire, MD;  Location: [place] [facility];  Service: Cardiovascular;  Laterality: N/A;   LEFT HEART CATH AND CORONARY ANGIOGRAPHY Left 08/05/2020   Procedure: LEFT HEART CATH AND CORONARY ANGIOGRAPHY poss PCI;  Surgeon: Wellington Hampshire, MD;  Location: [place] [facility];  Service: Cardiovascular;  Laterality: Left;   MUSCLE BIOPSY Left 12/31/2021   Procedure: LEFT VASTUS LATERALIS BIOPSY;  Surgeon: Meade Maw, MD;  Location: [facility] ORS;  Service: Neurosurgery;  Laterality: Left;   RIGHT/LEFT HEART CATH AND CORONARY ANGIOGRAPHY N/A 07/30/2021   Procedure: RIGHT/LEFT HEART CATH AND CORONARY ANGIOGRAPHY;  Surgeon: Wellington Hampshire, MD;  Location: [place] [facility];  Service: Cardiovascular;  Laterality: N/A;    Home Medications: Prior to Admission medications   Medication Sig Start Date End Date Taking? Authorizing Provider  Accu-Chek Softclix Lancets lancets TEST TWICE DAILY 10/11/20  Yes Sowles, Drue Stager, MD  Alpha Lipoic Acid 200 MG CAPS Take 200 mg by mouth daily.   Yes [provider]  blood glucose meter kit and supplies 1 each by Other route in the morning and at bedtime. Dispense based on patient and insurance preference. Use up to two  times daily. (FOR ICD-10 E10.9, E11.9). 10/09/20  Yes Sowles, Drue Stager, MD  carvedilol (COREG) 6.25 MG tablet TAKE 1 TABLET(6.25 MG) BY MOUTH TWICE DAILY 01/28/22  Yes Sowles, Drue Stager, MD  Coenzyme Q10 (COQ-10) 100 MG CAPS  Take 100 mg by mouth daily.   Yes [provider]  ezetimibe (ZETIA) 10 MG tablet Take 1 tablet (10 mg total) by mouth daily. 11/27/22 11/22/23 Yes Hammock, Sheri, NP  FARXIGA 10 MG TABS tablet Take 1 tablet (10 mg total) by mouth daily. 10/14/22  Yes Agbor-Etang, Aaron Edelman, MD  fluticasone (FLONASE) 50 MCG/ACT nasal spray Place 1 spray into both nostrils daily as needed for allergies or rhinitis.   Yes [provider]  fluticasone furoate-vilanterol (BREO ELLIPTA) 100-25 MCG/ACT AEPB Inhale 1 puff into the lungs daily. 11/26/22  Yes Tyler Pita, MD  ketoconazole (NIZORAL) 2 % shampoo Apply 1 Application topically 3 (three) times a week. Wash scalp/body 3 times a week as needed for alres, let sit 5 minutes before rinsing off, avoid eyes. May use once monthly for maintenance. 07/27/22  Yes Ralene Bathe, MD  levOCARNitine (CARNITOR) 330 MG tablet Take 330 mg by mouth 2 (two) times daily.   Yes [provider]  losartan-hydrochlorothiazide (HYZAAR) 50-12.5 MG tablet TAKE 1 TABLET BY MOUTH DAILY 10/05/22  Yes Furth, Cadence H, PA-C  mupirocin ointment (BACTROBAN) 2 % Apply 1 application topically daily. Qd to excision site Patient taking differently: Apply 1 application  topically daily as needed (skin irritation.). Qd to excision site 09/30/21  Yes Ralene Bathe, MD  nitroGLYCERIN (NITROSTAT) 0.4 MG SL tablet Place 1 tablet (0.4 mg total) under the tongue every 5 (five) minutes as needed for chest pain. 07/30/21  Yes Wellington Hampshire, MD  omeprazole (PRILOSEC) 40 MG capsule TAKE 1 CAPSULE(40 MG) BY MOUTH TWICE DAILY BEFORE A MEAL 10/05/22  Yes Vanga, Tally Due, MD  pregabalin (LYRICA) 50 MG capsule Take 50 mg by mouth daily.   Yes [provider]  ranolazine (RANEXA) 1000 MG SR tablet TAKE 1 TABLET(1000 MG) BY MOUTH TWICE DAILY 01/04/23  Yes Hammock, Sheri, NP  Riboflavin (B-2) 100 MG TABS Take 100 mg by mouth daily.   Yes [provider]  sildenafil  (VIAGRA) 100 MG tablet Take 1 tablet (100 mg total) by mouth daily as needed for erectile dysfunction. 06/25/22  Yes Sowles, Drue Stager, MD  sucralfate (CARAFATE) 1 GM/10ML suspension SHAKE LIQUID AND TAKE 10 ML(1 GRAM) BY MOUTH FOUR TIMES DAILY 08/17/22  Yes Vanga, Tally Due, MD  temazepam (RESTORIL) 15 MG capsule TAKE 1 CAPSULE(15 MG) BY MOUTH AT BEDTIME AS NEEDED FOR SLEEP 11/18/22  Yes Sowles, Drue Stager, MD  Vitamin D, Ergocalciferol, (DRISDOL) 1.25 MG (50000 UNIT) CAPS capsule Take 1 capsule (50,000 Units total) by mouth every 7 (seven) days. 12/31/22  Yes Sowles, Drue Stager, MD  ACCU-CHEK GUIDE test strip USE TO TEST BLOOD SUGAR THREE TIMES DAILY AS NEEDED 01/08/23   Steele Sizer, MD    Allergies: Allergies  Allergen Reactions   Gabapentin Other (See Comments)    Groggy-Mood Changes   Latex Rash   Penicillins Hives and Rash    Has patient had a PCN reaction causing immediate rash, facial/tongue/throat swelling, SOB or lightheadedness with hypotension: Yes Has patient had a PCN reaction causing severe rash involving mucus membranes or skin necrosis: No Has patient had a PCN reaction that required hospitalization No Has patient had a PCN reaction occurring within the last 10 years: No If all of the above answers are "NO", then may proceed with Cephalosporin use.     Social History:  reports that he quit smoking about 17 years ago. His smoking use included cigars. He started smoking about 20 years ago. He has never used smokeless tobacco. He reports that he does not currently use alcohol. He reports that he does not use drugs.   Family History: Family History  Problem Relation Age of Onset   Hypertension Mother    Heart Problems Mother    High Cholesterol Mother    Heart Problems Father 40       myocardial infarction   Mental illness Brother    Other Maternal Aunt        COVID   Lung cancer Maternal Aunt    Lung cancer Maternal Aunt    Cancer Maternal Uncle        unknown    Prostate cancer Maternal Uncle    Testicular cancer Paternal Uncle    Thyroid disease Daughter    Breast cancer Cousin     Review of Systems: Review of Systems  Constitutional:  Negative for chills and fever.  HENT:  Negative for hearing loss.   Respiratory:  Negative for shortness of breath.   Cardiovascular:  Negative for chest pain.  Gastrointestinal:  Positive for abdominal pain.  Negative for nausea and vomiting.  Genitourinary:  Negative for dysuria.  Musculoskeletal:  Negative for myalgias.  Skin:  Negative for rash.  Neurological:  Negative for dizziness.  Psychiatric/Behavioral:  Negative for depression.     Physical Exam BP (!) 167/117   Pulse 64   Temp 98.2 F (36.8 C) (Oral)   Ht '5\' 11"'$  (1.803 m)   Wt 225 lb 3.2 oz (102.2 kg)   SpO2 90%   BMI 31.41 kg/m  CONSTITUTIONAL: No acute distress, well nourished. HEENT:  Normocephalic, atraumatic, extraocular motion intact. NECK: Trachea is midline, and there is no jugular venous distension.  RESPIRATORY:  Lungs are clear, and breath sounds are equal bilaterally. Normal respiratory effort without pathologic use of accessory muscles. CARDIOVASCULAR: Heart is regular without murmurs, gallops, or rubs. GI: The abdomen is soft, non-distended, with some discomfort to palpation in the inner right groin.  There is a possible medial recurrence in the right groin, though it is not fully clear.  There is also a possible new hernia in the left groin, though is asymptomatic.  Otherwise, no recurrence at umbilicus and prior incisions are well healed.  MUSCULOSKELETAL:  Normal muscle strength and tone in all four extremities.  No peripheral edema or cyanosis. SKIN: Skin turgor is normal. There are no pathologic skin lesions.  NEUROLOGIC:  Motor and sensation is grossly normal.  Cranial nerves are grossly intact. PSYCH:  Alert and oriented to person, place and time. Affect is normal.  Laboratory Analysis: Labs from 11/04/22: Na 142, K  3.6, Cl 104, CO2 29, BUN 8, Cr 0.71.  LFTs within normal.  WBC 8, Hgb 18.2, Hct 53.7, Plt 229.  HgA1c 5.4  Imaging: No results found.  Assessment and Plan: This is a 62 y.o. male with right groin pain.  --Discussed with the patient that it is not fully clear if he's having a recurrence in the right groin or not.  Unfortunately his pain could also be related to nerve irritation given that he's had two surgeries in the right groin, particularly if his pain had continued since surgery.  He could possibly have a new hernia in the left groin, but also unclear.  Discussed with the patient that it would be best to order a new CT scan for evaluate the inguinal anatomy to see if there is a recurrence in the right side that could explain the pain, vs this being nerve/scar tissue related instead.  He's in agreement. --Patient will follow up with me after CT scan is done to review/discuss the results, and if needed, schedule/plan surgery.  Briefly discussed that if he does have a recurrence, could possibly repair via robotic/minimally invasive approach.  I spent 40 minutes dedicated to the care of this patient on the date of this encounter to include pre-visit review of records, face-to-face time with the patient discussing diagnosis and management, and any post-visit coordination of care.   Melvyn Neth, [facility]

## 2023-01-08 NOTE — Patient Instructions (Addendum)
Your CT is scheduled for 01/15/2023 9 am (arrive by 8:45 am) at [facility]. Nothing to eat or drink 4 hours prior.   Please get labs drawn at [facility]. You do not need an appointment.    If you have any concerns or questions, please feel free to call our office. See follow up appointment below.   Hernia, Adult     A hernia happens when an organ or tissue inside your body pushes out through a weak spot in the muscles of your belly (abdomen). This makes a bulge. The bulge may be: In a scar from a surgery that was done in your belly (incisional hernia). Near your belly button (umbilical hernia). In your groin (inguinal hernia). Your groin is the area where your leg meets your lower belly. If you are a male, this type could also be in your scrotum. In your upper thigh (femoral hernia). Inside your belly (hiatal hernia). This happens when your stomach slides above the muscle between your belly and your chest (diaphragm). What are the causes? This condition may be caused by: Lifting heavy things. Coughing over a long period of time. Having trouble pooping (constipation). Trouble pooping can lead to straining. A cut from surgery in your belly. A physical problem that is present at birth. Being very overweight. Smoking. Too much fluid in your belly. A testicle that has not moved down into the scrotum, in males. What are the signs or symptoms? The main symptom is a bulge in the area of the hernia, but a bulge may not always be seen. It may grow bigger or be easier to see when you cough or strain (such as when lifting something heavy). A hernia that can be pushed back into the belly rarely causes pain. A hernia that cannot be pushed back into the belly may lose its blood supply. This may cause: Pain. Fever. A feeling like you may vomit, and vomiting. Swelling. Trouble pooping. How is this treated? A hernia that is small and painless may not need to be treated. A hernia that  is large or painful may be treated with surgery. Surgery to treat a hernia involves pushing the bulge back into place and repairing the weak area of the muscle or belly. Follow these instructions at home: Activity Avoid straining the muscles near your hernia. This can happen when you: Lift something heavy. Poop (have a bowel movement). Do not lift anything that is heavier than 10 lb (4.5 kg), or the limit that you are told. When you lift something heavy, use your leg muscles. Do not use your back muscles to lift. Prevent trouble pooping If told by your doctor, take steps to prevent trouble pooping. You may need to: Drink enough fluid to keep your pee (urine) pale yellow. Take medicines. You will be told what medicines to take. Eat foods that are high in fiber. These include beans, whole grains, and fresh fruits and vegetables. Limit foods that are high in fat and sugar. These include fried or sweet foods. General instructions When you cough, try to cough gently. You may try to push your hernia back in by gently pressing on it when you are lying down. Do not try to force the bulge back in if it will not go in easily. If you are overweight, work with your doctor to lose weight safely. Do not smoke or use any products that contain nicotine or tobacco. If you need help quitting, ask your doctor. If you will be  having surgery, watch your hernia for changes in shape, size, or color. Tell your doctor if you see any changes. Take over-the-counter and prescription medicines only as told by your doctor. Keep all follow-up visits. Contact a doctor if: You get new pain, swelling, or redness near your hernia. You poop fewer times in a week than normal. You have trouble pooping. You have poop that is more dry than normal. You have poop that is harder or larger than normal. Get help right away if: You have a fever or chills. You have belly pain that gets worse. You feel like you may vomit, or you  vomit. Your hernia cannot be pushed in by gently pressing on it when you are lying down. Your hernia: Changes in shape or size. Changes color. Feels hard, or it hurts when you touch it. These symptoms may be an emergency. Get help right away. Call your local emergency services (911 in the [place] Do not wait to see if the symptoms will go away. Do not drive yourself to the hospital. Summary A hernia happens when an organ or tissue inside your body pushes out through a weak spot in the belly muscles. This creates a bulge. If your hernia is small and it does not hurt, you may not need treatment. If your hernia is large or it hurts, you may need surgery. If you will be having surgery, watch your hernia for changes in shape, size, or color. Tell your doctor about any changes. This information is not intended to replace advice given to you by your health care provider. Make sure you discuss any questions you have with your health care provider. Document Revised: 07/22/2020 Document Reviewed: 07/22/2020 Elsevier Patient Education  [place].

## 2023-01-12 ENCOUNTER — Other Ambulatory Visit
Admission: RE | Admit: 2023-01-12 | Discharge: 2023-01-12 | Disposition: A | Discharge status: Home / Self Care | Payer: Medicaid Other | Attending: Surgery | Admitting: Surgery

## 2023-01-12 DIAGNOSIS — R1031 Right lower quadrant pain: Secondary | ICD-10-CM

## 2023-01-12 LAB — BASIC METABOLIC PANEL
Anion gap: 7 (ref 5–15)
BUN: 14 mg/dL (ref 6–20)
CO2: 27 mmol/L (ref 22–32)
Calcium: 8.6 mg/dL — ABNORMAL LOW (ref 8.9–10.3)
Chloride: 103 mmol/L (ref 98–111)
Creatinine, Ser: 0.95 mg/dL (ref 0.61–1.24)
GFR, Estimated: >60 mL/min (ref >60)
Glucose, Bld: 128 mg/dL — ABNORMAL HIGH (ref 70–99)
Potassium: 3.5 mmol/L (ref 3.5–5.1)
Sodium: 137 mmol/L (ref 135–145)

## 2023-01-15 ENCOUNTER — Ambulatory Visit
Admission: RE | Admit: 2023-01-15 | Discharge: 2023-01-15 | Disposition: A | Discharge status: Home / Self Care | Payer: Medicaid Other | Source: Ambulatory Visit | Attending: Surgery | Admitting: Surgery

## 2023-01-15 DIAGNOSIS — I700 Atherosclerosis of aorta: Secondary | ICD-10-CM | POA: Diagnosis not present

## 2023-01-15 DIAGNOSIS — R1031 Right lower quadrant pain: Secondary | ICD-10-CM

## 2023-01-15 MED ORDER — IOHEXOL 350 MG/ML SOLN
100.0000 mL | Freq: Once | INTRAVENOUS | Status: AC | PRN
  Administered 2023-01-15: 100 mL via INTRAVENOUS

## 2023-01-18 ENCOUNTER — Encounter: Payer: Self-pay | Admitting: Pulmonary Disease

## 2023-01-18 DIAGNOSIS — G4733 Obstructive sleep apnea (adult) (pediatric): Secondary | ICD-10-CM

## 2023-01-18 NOTE — Telephone Encounter (Signed)
Dr. Gonzalez, please advise. Thanks 

## 2023-01-18 NOTE — Telephone Encounter (Signed)
This study was scanned in yesterday.  It has not even been put on my queue to review.  I have now reviewed the results.  He does have sleep apnea that is confirmed.  He does need BiPAP 12/8 with heated humidity and mask of choice.  We will need to set him up with a sleep provider.

## 2023-01-18 NOTE — Telephone Encounter (Signed)
Spoke to patient via telephone and relayed below results/recommendations. He agrees with bipap. Order has been placed.  He will call back to schedule appt with sleep provider once setup on cpap. Nothing further needed.

## 2023-01-19 ENCOUNTER — Ambulatory Visit: Payer: Medicaid Other | Attending: Cardiology

## 2023-01-19 DIAGNOSIS — R0602 Shortness of breath: Secondary | ICD-10-CM | POA: Diagnosis not present

## 2023-01-19 LAB — ECHOCARDIOGRAM COMPLETE
AR max vel: 2.58 cm2
AV Area VTI: 2.17 cm2
AV Area mean vel: 2.49 cm2
AV Mean grad: 3 mmHg
AV Peak grad: 5.2 mmHg
Ao pk vel: 1.14 m/s
Area-P 1/2: 2.64 cm2
Calc EF: 52.8 %
S' Lateral: 3.6 cm
Single Plane A2C EF: 52.2 %
Single Plane A4C EF: 53.2 %

## 2023-01-20 ENCOUNTER — Telehealth: Payer: Self-pay | Admitting: Cardiovascular Disease

## 2023-01-20 ENCOUNTER — Telehealth: Payer: Self-pay

## 2023-01-20 ENCOUNTER — Encounter: Payer: Self-pay | Admitting: Surgery

## 2023-01-20 ENCOUNTER — Ambulatory Visit (INDEPENDENT_AMBULATORY_CARE_PROVIDER_SITE_OTHER): Payer: Medicaid Other | Admitting: Surgery

## 2023-01-20 ENCOUNTER — Ambulatory Visit (INDEPENDENT_AMBULATORY_CARE_PROVIDER_SITE_OTHER): Payer: Medicaid Other

## 2023-01-20 VITALS — BP 162/96 | HR 62 | Temp 97.8°F | Ht 71.0 in | Wt 226.2 lb

## 2023-01-20 DIAGNOSIS — R1031 Right lower quadrant pain: Secondary | ICD-10-CM | POA: Diagnosis not present

## 2023-01-20 DIAGNOSIS — E538 Deficiency of other specified B group vitamins: Secondary | ICD-10-CM | POA: Diagnosis not present

## 2023-01-20 MED ORDER — CYANOCOBALAMIN 1000 MCG/ML IJ SOLN
1000.0000 ug | Freq: Once | INTRAMUSCULAR | Status: AC
  Administered 2023-01-20: 1000 ug via INTRAMUSCULAR

## 2023-01-20 NOTE — H&P (View-Only) (Signed)
01/20/2023  History of Present Illness: [name] [name] is a 62 y.o. male presenting for follow up of right groin pain.  He reports that he's still having pain in the right groin, and has noticed some discomfort starting on the left groin as well.  Denies any worsening pain and denies any specific bulging in either groin.  He had a CT scan on 01/15/23 which did not show a specific hernia defect either on either groin.  On the right groin, post-operative changes are seen with scar tissue and mesh visible.  On the left groin, there may be an area of invagination/weakness in the abdominal wall, but no hernia defect exactly.  Past Medical History: Past Medical History:  Diagnosis Date   Allergy    Angina, class III (HCC)    Chronic constipation    Degeneration of intervertebral disc of cervical region    Diabetes mellitus without complication (HCC)    Diastolic dysfunction    a. 06/2017 Echo: EF 60-65%, no rwma, Gr1 DD.   Dyspnea    GERD (gastroesophageal reflux disease)    Hernia 1991   02/10/2012-RIH repair   Hyperlipidemia    Hypertension 2008   Nerve root pain    Neuropathy    Non-obstructive CAD (coronary artery disease)    a. 05/30/2015 cath: LM nl, mLAD 50% (FFR 0.83), LCx minor irregs, RCA minor irregs, EF 55-65%-->Med Rx; b. 03/2016 Cath: LM nl LAD 55m D1/2/3 min irregs, LCX min irregs, OM1/2/3 nl, RCA min irregs, RPDA/RPAV/RPL1/RPL2 nl-->Med Rx; b. 07/2020 Cath: LM nl, LAD 369mD1/2/3 min irregs, LCX min irregs, RCA min irregs, RPDA/RPAV/RPL1,2 nl, EF 55-65%.   Obesity, unspecified 2012   Personal history of tobacco use, presenting hazards to health 2012   Polycythemia    Rectal bleeding 02/11/2017   [facility]urrent Right Inguinal Hernia Repair 02/09/2011, 04/07/2013   [facility] Shortness of breath    Sleep apnea    a. On CPAP;  b. 06/2017 Echo: no PAH.   Umbilical hernia without mention of obstruction or gangrene 02/09/2011   [facility] Vitamin D deficiency      Past  Surgical History: Past Surgical History:  Procedure Laterality Date   BACK SURGERY  12/2008   [facility] CARDIAC CATHETERIZATION N/A 05/30/2015   Procedure: Left Heart Cath;  Surgeon: MuWellington HampshireMD;  [place];  Service: Cardiovascular;  Laterality: N/A;   CARDIAC CATHETERIZATION N/A 04/16/2016   Procedure: Left Heart Cath and Coronary Angiography;  Surgeon: MuWellington HampshireMD;  [place];  Service: Cardiovascular;  Laterality: N/A;   COLONOSCOPY  2014   Dr. SaJamal Collin COLONOSCOPY WITH PROPOFOL N/A 12/25/2016   Procedure: COLONOSCOPY WITH PROPOFOL;  Surgeon: KiJonathon BellowsMD;  Location: [facility] ENDOSCOPY;  Service: Endoscopy;  Laterality: N/A;   COLONOSCOPY WITH PROPOFOL N/A 01/29/2017   Procedure: COLONOSCOPY WITH PROPOFOL;  Surgeon: KiJonathon BellowsMD;  Location: [facility] ENDOSCOPY;  Service: Endoscopy;  Laterality: N/A;   COLONOSCOPY WITH PROPOFOL N/A 04/11/2020   Procedure: COLONOSCOPY WITH PROPOFOL;  Surgeon: AnJonathon BellowsMD;  [facility];  Service: Gastroenterology;  Laterality: N/A;   ESOPHAGEAL MANOMETRY N/A 03/04/2022   Procedure: ESOPHAGEAL MANOMETRY (EM);  Surgeon: NaMauri PoleMD;  [facility];  Service: Gastroenterology;  Laterality: N/A;   ESOPHAGOGASTRODUODENOSCOPY (EGD) WITH PROPOFOL N/A 01/26/2019   Procedure: ESOPHAGOGASTRODUODENOSCOPY (EGD) WITH PROPOFOL;  Surgeon: VaLin LandsmanMD;  [place] Service:  Gastroenterology;  Laterality: N/A;   ESOPHAGOGASTRODUODENOSCOPY (EGD) WITH PROPOFOL N/A 09/10/2021   Procedure: ESOPHAGOGASTRODUODENOSCOPY (EGD) WITH PROPOFOL;  Surgeon: Lin Landsman, MD;  Location: [facility] ENDOSCOPY;  Service: Gastroenterology;  Laterality: N/A;   EVALUATION UNDER ANESTHESIA WITH HEMORRHOIDECTOMY N/A 02/24/2017   Procedure: EXAM UNDER ANESTHESIA WITH POSSIBLE EXCISION OF INTERNAL HEMORRHOIDS;  Surgeon: Olean Ree, MD;  Location: [facility] ORS;  Service: General;  Laterality:  N/A;   FISSURECTOMY  02/24/2017   Procedure: FISSURECTOMY;  Surgeon: Olean Ree, MD;  Location: [facility] ORS;  Service: General;;   HAND SURGERY Left 2020   HERNIA REPAIR Right 1991   INGUINAL HERNIA REPAIR Right 2012   Dr Teryl Lucy HERNIA REPAIR Right 2014   Dr [name]   INTRAVASCULAR PRESSURE WIRE/FFR STUDY N/A 07/30/2021   Procedure: INTRAVASCULAR PRESSURE WIRE/FFR STUDY;  Surgeon: Wellington Hampshire, MD;  Location: [place] CV LAB;  Service: Cardiovascular;  Laterality: N/A;   LEFT HEART CATH AND CORONARY ANGIOGRAPHY Left 08/05/2020   Procedure: LEFT HEART CATH AND CORONARY ANGIOGRAPHY poss PCI;  Surgeon: Wellington Hampshire, MD;  Location: [place] CV LAB;  Service: Cardiovascular;  Laterality: Left;   MUSCLE BIOPSY Left 12/31/2021   Procedure: LEFT VASTUS LATERALIS BIOPSY;  Surgeon: Meade Maw, MD;  Location: [facility] ORS;  Service: Neurosurgery;  Laterality: Left;   RIGHT/LEFT HEART CATH AND CORONARY ANGIOGRAPHY N/A 07/30/2021   Procedure: RIGHT/LEFT HEART CATH AND CORONARY ANGIOGRAPHY;  Surgeon: Wellington Hampshire, MD;  Location: [place] CV LAB;  Service: Cardiovascular;  Laterality: N/A;    Home Medications: Prior to Admission medications   Medication Sig Start Date End Date Taking? Authorizing Provider  ACCU-CHEK GUIDE test strip USE TO TEST BLOOD SUGAR THREE TIMES DAILY AS NEEDED 01/08/23  Yes Sowles, Drue Stager, MD  Accu-Chek Softclix Lancets lancets TEST TWICE DAILY 10/11/20  Yes Sowles, Drue Stager, MD  Alpha Lipoic Acid 200 MG CAPS Take 200 mg by mouth daily.   Yes [provider]  blood glucose meter kit and supplies 1 each by Other route in the morning and at bedtime. Dispense based on patient and insurance preference. Use up to two  times daily. (FOR ICD-10 E10.9, E11.9). 10/09/20  Yes Sowles, Drue Stager, MD  carvedilol (COREG) 6.25 MG tablet TAKE 1 TABLET(6.25 MG) BY MOUTH TWICE DAILY 01/28/22  Yes Sowles, Drue Stager, MD  Coenzyme Q10 (COQ-10) 100 MG CAPS Take 100 mg by  mouth daily.   Yes [provider]  ezetimibe (ZETIA) 10 MG tablet Take 1 tablet (10 mg total) by mouth daily. 11/27/22 11/22/23 Yes Hammock, Sheri, NP  FARXIGA 10 MG TABS tablet Take 1 tablet (10 mg total) by mouth daily. 10/14/22  Yes Agbor-Etang, Aaron Edelman, MD  fluticasone (FLONASE) 50 MCG/ACT nasal spray Place 1 spray into both nostrils daily as needed for allergies or rhinitis.   Yes [provider]  fluticasone furoate-vilanterol (BREO ELLIPTA) 100-25 MCG/ACT AEPB Inhale 1 puff into the lungs daily. 11/26/22  Yes Tyler Pita, MD  ketoconazole (NIZORAL) 2 % shampoo Apply 1 Application topically 3 (three) times a week. Wash scalp/body 3 times a week as needed for alres, let sit 5 minutes before rinsing off, avoid eyes. May use once monthly for maintenance. 07/27/22  Yes Ralene Bathe, MD  levOCARNitine (CARNITOR) 330 MG tablet Take 330 mg by mouth 2 (two) times daily.   Yes [provider]  losartan-hydrochlorothiazide (HYZAAR) 50-12.5 MG tablet TAKE 1 TABLET BY MOUTH DAILY 10/05/22  Yes Furth, Cadence H, PA-C  mupirocin ointment (  BACTROBAN) 2 % Apply 1 application topically daily. Qd to excision site Patient taking differently: Apply 1 application  topically daily as needed (skin irritation.). Qd to excision site 09/30/21  Yes Ralene Bathe, MD  nitroGLYCERIN (NITROSTAT) 0.4 MG SL tablet Place 1 tablet (0.4 mg total) under the tongue every 5 (five) minutes as needed for chest pain. 07/30/21  Yes Wellington Hampshire, MD  omeprazole (PRILOSEC) 40 MG capsule TAKE 1 CAPSULE(40 MG) BY MOUTH TWICE DAILY BEFORE A MEAL 10/05/22  Yes Vanga, Tally Due, MD  pregabalin (LYRICA) 50 MG capsule Take 50 mg by mouth daily.   Yes [provider]  ranolazine (RANEXA) 1000 MG SR tablet TAKE 1 TABLET(1000 MG) BY MOUTH TWICE DAILY 01/04/23  Yes Hammock, Sheri, NP  Riboflavin (B-2) 100 MG TABS Take 100 mg by mouth daily.   Yes [provider]  sildenafil (VIAGRA) 100 MG  tablet Take 1 tablet (100 mg total) by mouth daily as needed for erectile dysfunction. 06/25/22  Yes Sowles, Drue Stager, MD  sucralfate (CARAFATE) 1 GM/10ML suspension SHAKE LIQUID AND TAKE 10 ML(1 GRAM) BY MOUTH FOUR TIMES DAILY 08/17/22  Yes Vanga, Tally Due, MD  temazepam (RESTORIL) 15 MG capsule TAKE 1 CAPSULE(15 MG) BY MOUTH AT BEDTIME AS NEEDED FOR SLEEP 11/18/22  Yes Sowles, Drue Stager, MD  Vitamin D, Ergocalciferol, (DRISDOL) 1.25 MG (50000 UNIT) CAPS capsule Take 1 capsule (50,000 Units total) by mouth every 7 (seven) days. 12/31/22  Yes Steele Sizer, MD    Allergies: Allergies  Allergen Reactions   Gabapentin Other (See Comments)    Groggy-Mood Changes   Latex Rash   Penicillins Hives and Rash    Has patient had a PCN reaction causing immediate rash, facial/tongue/throat swelling, SOB or lightheadedness with hypotension: Yes Has patient had a PCN reaction causing severe rash involving mucus membranes or skin necrosis: No Has patient had a PCN reaction that required hospitalization No Has patient had a PCN reaction occurring within the last 10 years: No If all of the above answers are "NO", then may proceed with Cephalosporin use.     Review of Systems: Review of Systems  Constitutional:  Negative for chills and fever.  Respiratory:  Negative for shortness of breath.   Cardiovascular:  Negative for chest pain.  Gastrointestinal:  Positive for abdominal pain. Negative for constipation, diarrhea, nausea and vomiting.  Skin:  Negative for rash.    Physical Exam BP (!) 162/96   Pulse 62   Temp 97.8 F (36.6 C) (Oral)   Ht '5\' 11"'$  (1.803 m)   Wt 226 lb 3.2 oz (102.6 kg)   SpO2 94%   BMI 31.55 kg/m  CONSTITUTIONAL: No acute distress, well nourished. HEENT:  Normocephalic, atraumatic, extraocular motion intact. RESPIRATORY:  Lungs are clear, and breath sounds are equal bilaterally. Normal respiratory effort without pathologic use of accessory muscles. CARDIOVASCULAR: Heart is  regular without murmurs, gallops, or rubs. GI: The abdomen is soft, non-distended, with focal tenderness to palpation in the mid to lateral aspect of the right groin.  On today's exam, there is no clear hernia defect on either right or left groin compared to my exam on his last visit.  NEUROLOGIC:  Motor and sensation is grossly normal.  Cranial nerves are grossly intact. PSYCH:  Alert and oriented to person, place and time. Affect is normal.  Labs/Imaging: CT abdomen/pelvis on 01/15/23: IMPRESSION: 1. No acute abnormality in the abdomen or pelvis. No finding to explain the patient's symptoms. 2. Unchanged postoperative appearance of  right inguinal hernia repair.  Assessment and Plan: This is a 62 y.o. male with right groin pain.  --Discussed with the patient the findings on the CT scan images.  On his exam today, there's less clear evidence of hernia on either groin compared to his last exam on 01/08/23.  He is still having pain in the right groin area, but now also reports having discomfort in the left groin.  I discussed with him that the pain in the right groin could be all related to his two hernia repairs and the scar tissue that's formed and how it can aggravate the nerves in the area.  However, I think given the left groin symptoms, and the possible bulging palpated on his last exam, we could potentially take him to the operating room.  He's in agreement and would like to figure out better what's the reason for his pain. --Discussed with him that we can plan on a robotic assisted diagnostic laparoscopy.  If on laparoscopy there is evidence of right inguinal recurrent hernia, or new left inguinal hernia, then we can continue with the surgery and do a robotic assisted inguinal hernia repair.  If on initial laparoscopy there is no evidence of any hernia formation, then we would stop the surgery.  If that is the case, then the main reason for his pain would be related to the nerves and scar tissue  and we can refer him to the pain clinic for further evaluation/management. --Reviewed with him the surgery at length including the planned incisions, risks of bleeding, infection, injury to surrounding structures, the use of mesh if we repair any hernia, that this would be an outpatient surgery, post-operative pain control, activity restrictions, and he's willing to proceed. --Will schedule him for surgery on 02/03/23.  Will also send for medical and cardiology clearance.  All of his questions have been answered  I spent 40 minutes dedicated to the care of this patient on the date of this encounter to include pre-visit review of records, face-to-face time with the patient discussing diagnosis and management, and any post-visit coordination of care.   Melvyn Neth, [facility]

## 2023-01-20 NOTE — Telephone Encounter (Signed)
Faxed medical clearance to Dr. Steele Sizer at [phone].

## 2023-01-20 NOTE — Progress Notes (Signed)
01/20/2023  History of Present Illness: [name] [name] is a 63 y.o. male presenting for follow up of right groin pain.  He reports that he's still having pain in the right groin, and has noticed some discomfort starting on the left groin as well.  Denies any worsening pain and denies any specific bulging in either groin.  He had a CT scan on 01/15/23 which did not show a specific hernia defect either on either groin.  On the right groin, post-operative changes are seen with scar tissue and mesh visible.  On the left groin, there may be an area of invagination/weakness in the abdominal wall, but no hernia defect exactly.  Past Medical History: Past Medical History:  Diagnosis Date   Allergy    Angina, class III (HCC)    Chronic constipation    Degeneration of intervertebral disc of cervical region    Diabetes mellitus without complication (HCC)    Diastolic dysfunction    a. 06/2017 Echo: EF 60-65%, no rwma, Gr1 DD.   Dyspnea    GERD (gastroesophageal reflux disease)    Hernia 1991   02/10/2012-RIH repair   Hyperlipidemia    Hypertension 2008   Nerve root pain    Neuropathy    Non-obstructive CAD (coronary artery disease)    a. 05/30/2015 cath: LM nl, mLAD 50% (FFR 0.83), LCx minor irregs, RCA minor irregs, EF 55-65%-->Med Rx; b. 03/2016 Cath: LM nl LAD 61m D1/2/3 min irregs, LCX min irregs, OM1/2/3 nl, RCA min irregs, RPDA/RPAV/RPL1/RPL2 nl-->Med Rx; b. 07/2020 Cath: LM nl, LAD 363mD1/2/3 min irregs, LCX min irregs, RCA min irregs, RPDA/RPAV/RPL1,2 nl, EF 55-65%.   Obesity, unspecified 2012   Personal history of tobacco use, presenting hazards to health 2012   Polycythemia    Rectal bleeding 02/11/2017   [facility]urrent Right Inguinal Hernia Repair 02/09/2011, 04/07/2013   [facility] Shortness of breath    Sleep apnea    a. On CPAP;  b. 06/2017 Echo: no PAH.   Umbilical hernia without mention of obstruction or gangrene 02/09/2011   [facility] Vitamin D deficiency      Past  Surgical History: Past Surgical History:  Procedure Laterality Date   BACK SURGERY  12/2008   [facility] CARDIAC CATHETERIZATION N/A 05/30/2015   Procedure: Left Heart Cath;  Surgeon: MuWellington HampshireMD;  [place];  Service: Cardiovascular;  Laterality: N/A;   CARDIAC CATHETERIZATION N/A 04/16/2016   Procedure: Left Heart Cath and Coronary Angiography;  Surgeon: MuWellington HampshireMD;  [place] [facility];  Service: Cardiovascular;  Laterality: N/A;   COLONOSCOPY  2014   Dr. SaJamal Collin COLONOSCOPY WITH PROPOFOL N/A 12/25/2016   Procedure: COLONOSCOPY WITH PROPOFOL;  Surgeon: KiJonathon BellowsMD;  Location: [facility] ENDOSCOPY;  Service: Endoscopy;  Laterality: N/A;   COLONOSCOPY WITH PROPOFOL N/A 01/29/2017   Procedure: COLONOSCOPY WITH PROPOFOL;  Surgeon: KiJonathon BellowsMD;  Location: [facility] ENDOSCOPY;  Service: Endoscopy;  Laterality: N/A;   COLONOSCOPY WITH PROPOFOL N/A 04/11/2020   Procedure: COLONOSCOPY WITH PROPOFOL;  Surgeon: AnJonathon BellowsMD;  [facility];  Service: Gastroenterology;  Laterality: N/A;   ESOPHAGEAL MANOMETRY N/A 03/04/2022   Procedure: ESOPHAGEAL MANOMETRY (EM);  Surgeon: NaMauri PoleMD;  [facility];  Service: Gastroenterology;  Laterality: N/A;   ESOPHAGOGASTRODUODENOSCOPY (EGD) WITH PROPOFOL N/A 01/26/2019   Procedure: ESOPHAGOGASTRODUODENOSCOPY (EGD) WITH PROPOFOL;  Surgeon: VaLin LandsmanMD;  [place] [facility] Service:  Gastroenterology;  Laterality: N/A;   ESOPHAGOGASTRODUODENOSCOPY (EGD) WITH PROPOFOL N/A 09/10/2021   Procedure: ESOPHAGOGASTRODUODENOSCOPY (EGD) WITH PROPOFOL;  Surgeon: Lin Landsman, MD;  Location: [facility] ENDOSCOPY;  Service: Gastroenterology;  Laterality: N/A;   EVALUATION UNDER ANESTHESIA WITH HEMORRHOIDECTOMY N/A 02/24/2017   Procedure: EXAM UNDER ANESTHESIA WITH POSSIBLE EXCISION OF INTERNAL HEMORRHOIDS;  Surgeon: Olean Ree, MD;  Location: [facility] ORS;  Service: General;  Laterality:  N/A;   FISSURECTOMY  02/24/2017   Procedure: FISSURECTOMY;  Surgeon: Olean Ree, MD;  Location: [facility] ORS;  Service: General;;   HAND SURGERY Left 2020   HERNIA REPAIR Right 1991   INGUINAL HERNIA REPAIR Right 2012   Dr Teryl Lucy HERNIA REPAIR Right 2014   Dr [name]   INTRAVASCULAR PRESSURE WIRE/FFR STUDY N/A 07/30/2021   Procedure: INTRAVASCULAR PRESSURE WIRE/FFR STUDY;  Surgeon: Wellington Hampshire, MD;  Location: [place] [facility] LAB;  Service: Cardiovascular;  Laterality: N/A;   LEFT HEART CATH AND CORONARY ANGIOGRAPHY Left 08/05/2020   Procedure: LEFT HEART CATH AND CORONARY ANGIOGRAPHY poss PCI;  Surgeon: Wellington Hampshire, MD;  Location: [place] CV LAB;  Service: Cardiovascular;  Laterality: Left;   MUSCLE BIOPSY Left 12/31/2021   Procedure: LEFT VASTUS LATERALIS BIOPSY;  Surgeon: Meade Maw, MD;  Location: [facility] ORS;  Service: Neurosurgery;  Laterality: Left;   RIGHT/LEFT HEART CATH AND CORONARY ANGIOGRAPHY N/A 07/30/2021   Procedure: RIGHT/LEFT HEART CATH AND CORONARY ANGIOGRAPHY;  Surgeon: Wellington Hampshire, MD;  Location: [place] CV LAB;  Service: Cardiovascular;  Laterality: N/A;    Home Medications: Prior to Admission medications   Medication Sig Start Date End Date Taking? Authorizing Provider  ACCU-CHEK GUIDE test strip USE TO TEST BLOOD SUGAR THREE TIMES DAILY AS NEEDED 01/08/23  Yes Sowles, Drue Stager, MD  Accu-Chek Softclix Lancets lancets TEST TWICE DAILY 10/11/20  Yes Sowles, Drue Stager, MD  Alpha Lipoic Acid 200 MG CAPS Take 200 mg by mouth daily.   Yes [provider]  blood glucose meter kit and supplies 1 each by Other route in the morning and at bedtime. Dispense based on patient and insurance preference. Use up to two  times daily. (FOR ICD-10 E10.9, E11.9). 10/09/20  Yes Sowles, Drue Stager, MD  carvedilol (COREG) 6.25 MG tablet TAKE 1 TABLET(6.25 MG) BY MOUTH TWICE DAILY 01/28/22  Yes Sowles, Drue Stager, MD  Coenzyme Q10 (COQ-10) 100 MG CAPS Take 100 mg by  mouth daily.   Yes [provider]  ezetimibe (ZETIA) 10 MG tablet Take 1 tablet (10 mg total) by mouth daily. 11/27/22 11/22/23 Yes Hammock, Sheri, NP  FARXIGA 10 MG TABS tablet Take 1 tablet (10 mg total) by mouth daily. 10/14/22  Yes Agbor-Etang, Aaron Edelman, MD  fluticasone (FLONASE) 50 MCG/ACT nasal spray Place 1 spray into both nostrils daily as needed for allergies or rhinitis.   Yes [provider]  fluticasone furoate-vilanterol (BREO ELLIPTA) 100-25 MCG/ACT AEPB Inhale 1 puff into the lungs daily. 11/26/22  Yes Tyler Pita, MD  ketoconazole (NIZORAL) 2 % shampoo Apply 1 Application topically 3 (three) times a week. Wash scalp/body 3 times a week as needed for alres, let sit 5 minutes before rinsing off, avoid eyes. May use once monthly for maintenance. 07/27/22  Yes Ralene Bathe, MD  levOCARNitine (CARNITOR) 330 MG tablet Take 330 mg by mouth 2 (two) times daily.   Yes [provider]  losartan-hydrochlorothiazide (HYZAAR) 50-12.5 MG tablet TAKE 1 TABLET BY MOUTH DAILY 10/05/22  Yes Furth, Cadence H, PA-C  mupirocin ointment (  BACTROBAN) 2 % Apply 1 application topically daily. Qd to excision site Patient taking differently: Apply 1 application  topically daily as needed (skin irritation.). Qd to excision site 09/30/21  Yes Ralene Bathe, MD  nitroGLYCERIN (NITROSTAT) 0.4 MG SL tablet Place 1 tablet (0.4 mg total) under the tongue every 5 (five) minutes as needed for chest pain. 07/30/21  Yes Wellington Hampshire, MD  omeprazole (PRILOSEC) 40 MG capsule TAKE 1 CAPSULE(40 MG) BY MOUTH TWICE DAILY BEFORE A MEAL 10/05/22  Yes Vanga, Tally Due, MD  pregabalin (LYRICA) 50 MG capsule Take 50 mg by mouth daily.   Yes [provider]  ranolazine (RANEXA) 1000 MG SR tablet TAKE 1 TABLET(1000 MG) BY MOUTH TWICE DAILY 01/04/23  Yes Hammock, Sheri, NP  Riboflavin (B-2) 100 MG TABS Take 100 mg by mouth daily.   Yes [provider]  sildenafil (VIAGRA) 100 MG  tablet Take 1 tablet (100 mg total) by mouth daily as needed for erectile dysfunction. 06/25/22  Yes Sowles, Drue Stager, MD  sucralfate (CARAFATE) 1 GM/10ML suspension SHAKE LIQUID AND TAKE 10 ML(1 GRAM) BY MOUTH FOUR TIMES DAILY 08/17/22  Yes Vanga, Tally Due, MD  temazepam (RESTORIL) 15 MG capsule TAKE 1 CAPSULE(15 MG) BY MOUTH AT BEDTIME AS NEEDED FOR SLEEP 11/18/22  Yes Sowles, Drue Stager, MD  Vitamin D, Ergocalciferol, (DRISDOL) 1.25 MG (50000 UNIT) CAPS capsule Take 1 capsule (50,000 Units total) by mouth every 7 (seven) days. 12/31/22  Yes Steele Sizer, MD    Allergies: Allergies  Allergen Reactions   Gabapentin Other (See Comments)    Groggy-Mood Changes   Latex Rash   Penicillins Hives and Rash    Has patient had a PCN reaction causing immediate rash, facial/tongue/throat swelling, SOB or lightheadedness with hypotension: Yes Has patient had a PCN reaction causing severe rash involving mucus membranes or skin necrosis: No Has patient had a PCN reaction that required hospitalization No Has patient had a PCN reaction occurring within the last 10 years: No If all of the above answers are "NO", then may proceed with Cephalosporin use.     Review of Systems: Review of Systems  Constitutional:  Negative for chills and fever.  Respiratory:  Negative for shortness of breath.   Cardiovascular:  Negative for chest pain.  Gastrointestinal:  Positive for abdominal pain. Negative for constipation, diarrhea, nausea and vomiting.  Skin:  Negative for rash.    Physical Exam BP (!) 162/96   Pulse 62   Temp 97.8 F (36.6 C) (Oral)   Ht '5\' 11"'$  (1.803 m)   Wt 226 lb 3.2 oz (102.6 kg)   SpO2 94%   BMI 31.55 kg/m  CONSTITUTIONAL: No acute distress, well nourished. HEENT:  Normocephalic, atraumatic, extraocular motion intact. RESPIRATORY:  Lungs are clear, and breath sounds are equal bilaterally. Normal respiratory effort without pathologic use of accessory muscles. CARDIOVASCULAR: Heart is  regular without murmurs, gallops, or rubs. GI: The abdomen is soft, non-distended, with focal tenderness to palpation in the mid to lateral aspect of the right groin.  On today's exam, there is no clear hernia defect on either right or left groin compared to my exam on his last visit.  NEUROLOGIC:  Motor and sensation is grossly normal.  Cranial nerves are grossly intact. PSYCH:  Alert and oriented to person, place and time. Affect is normal.  Labs/Imaging: CT abdomen/pelvis on 01/15/23: IMPRESSION: 1. No acute abnormality in the abdomen or pelvis. No finding to explain the patient's symptoms. 2. Unchanged postoperative appearance of  right inguinal hernia repair.  Assessment and Plan: This is a 62 y.o. male with right groin pain.  --Discussed with the patient the findings on the CT scan images.  On his exam today, there's less clear evidence of hernia on either groin compared to his last exam on 01/08/23.  He is still having pain in the right groin area, but now also reports having discomfort in the left groin.  I discussed with him that the pain in the right groin could be all related to his two hernia repairs and the scar tissue that's formed and how it can aggravate the nerves in the area.  However, I think given the left groin symptoms, and the possible bulging palpated on his last exam, we could potentially take him to the operating room.  He's in agreement and would like to figure out better what's the reason for his pain. --Discussed with him that we can plan on a robotic assisted diagnostic laparoscopy.  If on laparoscopy there is evidence of right inguinal recurrent hernia, or new left inguinal hernia, then we can continue with the surgery and do a robotic assisted inguinal hernia repair.  If on initial laparoscopy there is no evidence of any hernia formation, then we would stop the surgery.  If that is the case, then the main reason for his pain would be related to the nerves and scar tissue  and we can refer him to the pain clinic for further evaluation/management. --Reviewed with him the surgery at length including the planned incisions, risks of bleeding, infection, injury to surrounding structures, the use of mesh if we repair any hernia, that this would be an outpatient surgery, post-operative pain control, activity restrictions, and he's willing to proceed. --Will schedule him for surgery on 02/03/23.  Will also send for medical and cardiology clearance.  All of his questions have been answered  I spent 40 minutes dedicated to the care of this patient on the date of this encounter to include pre-visit review of records, face-to-face time with the patient discussing diagnosis and management, and any post-visit coordination of care.   Melvyn Neth, [facility]

## 2023-01-20 NOTE — Telephone Encounter (Signed)
error 

## 2023-01-20 NOTE — Telephone Encounter (Signed)
Faxed cardiac clearance to Dr. Avel Sensor at [phone].

## 2023-01-20 NOTE — Telephone Encounter (Signed)
   Pre-operative Risk Assessment    Patient Name: [name] [name]  DOB: Dec 31, 1960 MRN: [id number]      Request for Surgical Clearance    Procedure:   DIAGNOSTIC LAPAROSCOPY  Date of Surgery:  Clearance 02/03/23                                Surgeon:  DR Olean Ree Surgeon's Group or Practice Name:  [facility] Phone number:  [phone] Fax number:  [phone]  Type of Clearance Requested:   - Medical    Type of Anesthesia:  General    Additional requests/questions:    SignedEli Phillips   01/20/2023, 12:04 PM

## 2023-01-20 NOTE — Patient Instructions (Addendum)
Our surgery scheduler Pamala Hurry will call you within 24-48 hours to get you scheduled. If you have not heard from her after 48 hours, please call our office. Have the blue sheet available when she calls to write down important information.   If you have any concerns or questions, please feel free to call our office.   Diagnostic Laparoscopy Diagnostic laparoscopy is a procedure to diagnose problems in the abdomen. It might be done for a variety of reasons, such as to look for scar tissue, a reason for abdominal pain, an abdominal mass or tumor, or fluid in the abdomen (ascites). This procedure may also be done to remove a tissue sample from the liver to look at under a microscope (biopsy). During the procedure, a thin, flexible tube that has a light and a camera on the end (laparoscope) is inserted through a small incision in the abdomen. The image from the camera is shown on a monitor to help the surgeon see inside the body. Tell a health care provider about: Any allergies you have. All medicines you are taking, including vitamins, herbs, eye drops, creams, and over-the-counter medicines. Any problems you or family members have had with anesthetic medicines. Any blood disorders you have. Any surgeries you have had. Any medical conditions you have. Whether you are pregnant or may be pregnant. What are the risks? Generally, this is a safe procedure. However, problems may occur, including: Infection. Bleeding. Allergic reactions to medicines or dyes. Damage to abdominal structures or organs, such as the intestines, liver, stomach, or spleen. What happens before the procedure? Staying hydrated Follow instructions from your health care provider about hydration, which may include: Up to 2 hours before the procedure - you may continue to drink clear liquids, such as water, clear fruit juice, black coffee, and plain tea.  Eating and drinking restrictions Follow instructions from your health care  provider about eating and drinking, which may include: 8 hours before the procedure - stop eating heavy meals or foods, such as meat, fried foods, or fatty foods. 6 hours before the procedure - stop eating light meals or foods, such as toast or cereal. 6 hours before the procedure - stop drinking milk or drinks that contain milk. 2 hours before the procedure - stop drinking clear liquids. Medicines Ask your health care provider about: Changing or stopping your regular medicines. This is especially important if you are taking diabetes medicines or blood thinners. Taking medicines such as aspirin and ibuprofen. These medicines can thin your blood. Do not take these medicines unless your health care provider tells you to take them. Taking over-the-counter medicines, vitamins, herbs, and supplements. General instructions Ask your health care provider: How your surgery site will be marked. What steps will be taken to help prevent infection. These steps may include: Removing hair at the surgery site. Washing skin with a germ-killing soap. Taking antibiotic medicine. Plan to have a responsible adult take you home from the hospital or clinic. Plan to have a responsible adult care for you for the time you are told after you leave the hospital or clinic. This is important. What happens during the procedure?  An IV will be inserted into one of your veins. You will be given one or more of the following: A medicine to help you relax (sedative). A medicine to numb the area (local anesthetic). A medicine to make you fall asleep (general anesthetic). A breathing tube will be placed down your throat to help you breathe during the procedure. Your  abdomen will be filled with an air-like gas so that your abdomen expands. This will give the surgeon more room to operate and will make your organs easier to see. Many small incisions will be made in your abdomen. A laparoscope and other surgical instruments will  be inserted into your abdomen through these incisions. A biopsy may be done. This will depend on the reason why you are having this procedure. The laparoscope and other instruments will be removed from your abdomen. The air-like gas will be released from your abdomen. Your incisions will be closed with stitches (sutures), skin glue, or surgical tapes and covered with a bandage (dressing). Your breathing tube will be removed. The procedure may vary among health care providers and hospitals. What happens after the procedure? Your blood pressure, heart rate, breathing rate, and blood oxygen level will be monitored until you leave the hospital or clinic. If you were given a sedative during the procedure, it can affect you for several hours. Do not drive or operate machinery until your health care provider says that it is safe. It is up to you to get the results of your procedure. Ask your health care provider, or the department that is doing the procedure, when your results will be ready. Summary Diagnostic laparoscopy is a procedure to diagnose problems in the abdomen using a thin, flexible tube that has a light and a camera on the end (laparoscope). Follow instructions from your health care provider about how to prepare for the procedure. Plan to have a responsible adult care for you for the time you are told after you leave the hospital or clinic. This is important. This information is not intended to replace advice given to you by your health care provider. Make sure you discuss any questions you have with your health care provider. Document Revised: 08/09/2020 Document Reviewed: 08/09/2020 Elsevier Patient Education  [place].

## 2023-01-20 NOTE — Progress Notes (Signed)
Reassuring study with heart squeeze 55-60%, some muscle enlargement and stiffing is noted, no issues with the heart valves was noted

## 2023-01-20 NOTE — Telephone Encounter (Signed)
   Name: [name] [name]  DOB: [birth year]  MRN: [id number]  Primary Cardiologist: [name], MD  Chart reviewed as part of pre-operative protocol coverage. The patient has an upcoming visit scheduled with Dr. Fletcher Anon on 01/29/2023 at which time clearance can be addressed in case there are any issues that would impact surgical recommendations.  Diagnostic laparoscopy is not scheduled until 02/03/2023 as below. I added preop FYI to appointment note so that provider is aware to address at time of outpatient visit.  Per office protocol the cardiology provider should forward their finalized clearance decision and recommendations regarding antiplatelet therapy to the requesting party below.    I will route this message as FYI to requesting party and remove this message from the preop box as separate preop APP input not needed at this time.   Please call with any questions.  Lenna Sciara, NP  01/20/2023, 12:28 PM

## 2023-01-21 ENCOUNTER — Telehealth: Payer: Self-pay | Admitting: Surgery

## 2023-01-21 NOTE — Telephone Encounter (Signed)
Patient has been advised of Pre-Admission date/time, and Surgery date at [facility].  Surgery Date: 02/03/23 Preadmission Testing Date: 01/27/23 (phone 1p-5p)  Patient has been made aware to call [phone], between 1-3:00pm the day before surgery, to find out what time to arrive for surgery.

## 2023-01-22 ENCOUNTER — Telehealth: Payer: Self-pay

## 2023-01-22 NOTE — Telephone Encounter (Signed)
Received medical clearance from Dr. Ancil Boozer. Pt's risk assessment is medium and is optimized for surgery.

## 2023-01-27 ENCOUNTER — Encounter: Payer: Self-pay | Admitting: Surgery

## 2023-01-27 ENCOUNTER — Encounter
Admission: RE | Admit: 2023-01-27 | Discharge: 2023-01-27 | Disposition: A | Discharge status: Home / Self Care | Payer: Medicaid Other | Source: Ambulatory Visit | Attending: Surgery | Admitting: Surgery

## 2023-01-27 ENCOUNTER — Inpatient Hospital Stay: Admission: RE | Admit: 2023-01-27 | Payer: Medicaid Other | Source: Ambulatory Visit

## 2023-01-27 DIAGNOSIS — Z01818 Encounter for other preprocedural examination: Secondary | ICD-10-CM

## 2023-01-27 HISTORY — DX: Unspecified convulsions: R56.9

## 2023-01-27 HISTORY — DX: Mitochondrial myopathy, not elsewhere classified: G71.3

## 2023-01-27 NOTE — Progress Notes (Signed)
antibiotics Perioperative Services Pre-Admission/Anesthesia Testing     Date: 01/27/23  Name: [name] [name] MRN:   [id number]  Re: Change in [place] for upcoming surgery   Case: [id number] Date/Time: 02/03/23 1300   Procedure: XI ROBOT ASSISTED DIAGNOSTIC LAPAROSCOPY, possible right inguinal hernia repair (Right)   Anesthesia type: General   Pre-op diagnosis: right groin pain   Location: [facility] OR ROOM 06 / [place] ORS FOR ANESTHESIA GROUP   Surgeons: Olean Ree, MD   Primary attending surgeon was consulted regarding consideration of therapeutic change in antimicrobial agent being used for preoperative prophylaxis in this patient's upcoming surgical case. Following analysis of the risk versus benefits, the patient's primary attending surgeon advised that it would be acceptable to discontinue the ordered clindamycin and place an order for cefazolin 2 gm IV on call to the OR. Orders for this patient were amended by me following collaborative conversation with attending surgeon taking into consideration of risk versus benefits associated with the change in therapy.  Honor Loh, MSN, APRN, FNP-C, CEN [facility]  Peri-operative Services Nurse Practitioner Phone: [phone] 01/27/23 11:35 AM

## 2023-01-27 NOTE — Progress Notes (Signed)
Perioperative Services Pre-Admission/Anesthesia Testing   Date: 01/27/23 Name: TEOMAN GIRAUD MRN:   [id number]  Re: Consideration of preoperative prophylactic antibiotic change   Request sent to: Olean Ree, MD (routed and/or faxed via [facility])  Planned Surgical Procedure(s):    Case: [id number] Date/Time: 02/03/23 1300   Procedure: XI ROBOT ASSISTED DIAGNOSTIC LAPAROSCOPY, possible right inguinal hernia repair (Right)   Anesthesia type: General   Pre-op diagnosis: right groin pain   Location: [place] [facility] [place] [facility]   Surgeons: Olean Ree, MD   Clinical Notes:  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   EMR review indicated that patient received PCN and/or cephalosporin in the past as follows: CEFAZOLIN received on 12/31/2021 with no documented ADRs.   Screened as appropriate for cephalosporin use during medication reconciliation No immediate angioedema, dysphagia, SOB, anaphylaxis symptoms. No severe rash involving mucous membranes or skin necrosis. No hospital admissions related to side effects of PCN/cephalosporin use.  No documented reaction to PCN or cephalosporin in the last 10 years.  Request:  As an evidence based approach to reducing the rate of incidence for post-operative SSI and the development of MDROs, could an agent that allows for narrower antimicrobial coverage for preoperative prophylaxis in this patient's upcoming surgical course be considered?   Currently ordered preoperative prophylactic ABX: clindamycin.   Specifically requesting change to cephalosporin (CEFAZOLIN).  Drug of choice for many procedures; it is the most widely studied antimicrobial agent with proven efficacy for antimicrobial prophylaxis.   Desirable duration of action, spectrum of activity against organisms commonly encountered in surgery, and it has an excellent safety profile  and low cost.   Active against streptococci, methicillin-susceptible staphylococci, and many gram-negative organisms.  Please communicate decision with me and I will change the orders in Epic as per your direction.   Things to consider: Many patients report that they were "allergic" to PCN earlier in life, however this does not translate into a true lifelong allergy. Patients can lose sensitivity to specific IgE antibodies over time if PCN is avoided (Kleris & Lugar, 2019).  Up to 10% of the adult population and 15% of hospitalized patients report an allergy to PCN, however clinical studies suggest that 90% of those reporting an allergy can tolerate PCN antibiotics (Kleris & Lugar, 2019).  Cross-sensitivity between PCN and cephalosporins has been documented as being as high as 10%, however this estimation included data believed to have been collected in a setting where there was contamination. Newer data suggests that the prevalence of cross-sensitivity between PCN and cephalosporins is actually estimated to be closer to 1% (Hermanides et al., 2018).   Patients labeled as PCN allergic, whether they are truly allergic or not, have been found to have inferior outcomes in terms of rates of serious infection, and these patients tend to have longer hospital stays ([place], 2019).  Treatment related secondary infections, such as Clostridioides difficile, have been linked to the improper use of broad spectrum antibiotics in patients improperly labeled as PCN allergic (Kleris & Lugar, 2019).  Anaphylaxis from cephalosporins is rare and the evidence suggests that there is no increased risk of an anaphylactic type reaction when cephalosporins are used in a PCN allergic patient (Pichichero, 2006).  Citations: Hermanides J, Lemkes BA, Prins Pearla Dubonnet MW, Terreehorst I. Presumed ?-Lactam Allergy and Cross-reactivity in the Operating Theater: A Practical Approach. Anesthesiology. 2018 Aug;129(2):335-342.  doi: 10.1097/ALN.0000000000002252.  PMID: 77939030.  Kleris, [place]., & Lugar, P. L. (2019). Things We Do For No Reason: Failing to Question a Penicillin Allergy History. Journal of hospital medicine, 14(10), [phone]. Advance online publication. https://www.wallace-middleton.info/  Pichichero, M. E. (2006). Cephalosporins can be prescribed safely for penicillin-allergic patients. Journal of family medicine, 55(2), 106-112. Accessed: https://cdn.mdedge.com/files/s56f-public/Document/September-2017/5502JFP_AppliedEvidence1.pdf   BHonor Loh MSN, APRN, FNP-C, CEN [facility] Nurse Practitioner FAX: [phone]/31/24 11:17 AM

## 2023-01-27 NOTE — Patient Instructions (Addendum)
Your procedure is scheduled on:02-03-23 Wednesday Report to the Registration Desk on the 1st floor of the [facility].Then proceed to the 2nd floor Surgery Desk To find out your arrival time, please call [phone] between 1PM - 3PM on:02-02-23 Tuesday If your arrival time is 6:00 am, do not arrive prior to that time as the [place] entrance doors do not open until 6:00 am.  REMEMBER: Instructions that are not followed completely may result in serious medical risk, up to and including death; or upon the discretion of your surgeon and anesthesiologist your surgery may need to be rescheduled.  Do not eat food after midnight the night before surgery.  No gum chewing, lozengers or hard candies.  You may however, drink Water up to 2 hours before you are scheduled to arrive for your surgery. Do not drink anything within 2 hours of your scheduled arrival time.  TAKE THESE MEDICATIONS THE MORNING OF SURGERY WITH A SIP OF WATER: -carvedilol (COREG)  -ezetimibe (ZETIA)  -pregabalin (LYRICA)  -ranolazine (RANEXA)  -omeprazole (PRILOSEC)  Stop your Trulicity 7 days prior to surgery-Last dose was on 01-25-23-You may resume this AFTER your surgery   Use your Breo Ellipta at home the morning of surgery   One week prior to surgery: Stop Anti-inflammatories (NSAIDS) such as Advil, Aleve, Ibuprofen, Motrin, Naproxen, Naprosyn and Aspirin based products such as Excedrin, Goodys Powder, BC Powder.You may however, continue to take Tylenol if needed for pain up until the day of surgery.  Stop ANY OVER THE COUNTER supplements/vitamins NOW (01-27-23) until after surgery.  No Alcohol for 24 hours before or after surgery.  No Smoking including e-cigarettes for 24 hours prior to surgery.  No chewable tobacco products for at least 6 hours prior to surgery.  No nicotine patches on the day of surgery.  Do not use any "recreational" drugs for at least a week prior to your surgery.  Please be advised that the  combination of cocaine and anesthesia may have negative outcomes, up to and including death. If you test positive for cocaine, your surgery will be cancelled.  On the morning of surgery brush your teeth with toothpaste and water, you may rinse your mouth with mouthwash if you wish. Do not swallow any toothpaste or mouthwash.  Use CHG Soap as directed on instruction sheet.  Do not wear jewelry, make-up, hairpins, clips or nail polish.  Do not wear lotions, powders, or perfumes.   Do not shave body from the neck down 48 hours prior to surgery just in case you cut yourself which could leave a site for infection.  Also, freshly shaved skin may become irritated if using the CHG soap.  Contact lenses, hearing aids and dentures may not be worn into surgery.  Do not bring valuables to the hospital. [facility] is not responsible for any missing/lost belongings or valuables.   Notify your doctor if there is any change in your medical condition (cold, fever, infection).  Wear comfortable clothing (specific to your surgery type) to the hospital.  After surgery, you can help prevent lung complications by doing breathing exercises.  Take deep breaths and cough every 1-2 hours. Your doctor may order a device called an Incentive Spirometer to help you take deep breaths. When coughing or sneezing, hold a pillow firmly against your incision with both hands. This is called "splinting." Doing this helps protect your incision. It also decreases belly discomfort.  If you are being admitted to the hospital overnight, leave your suitcase in  the car. After surgery it may be brought to your room.  If you are being discharged the day of surgery, you will not be allowed to drive home. You will need a responsible adult (18 years or older) to drive you home and stay with you that night.   If you are taking public transportation, you will need to have a responsible adult (18 years or older) with you. Please  confirm with your physician that it is acceptable to use public transportation.   Please call the [place] Dept. at [phone] if you have any questions about these instructions.  Surgery Visitation Policy:  Patients undergoing a surgery or procedure may have two family members or support persons with them as long as the person is not COVID-19 positive or experiencing its symptoms.   Due to an increase in RSV and influenza rates and associated hospitalizations, children ages 62 and under will not be able to visit patients in [facility]. Masks continue to be strongly recommended.

## 2023-01-27 NOTE — Progress Notes (Addendum)
Perioperative / Anesthesia Services  Pre-Admission Testing Clinical Review / Preoperative Anesthesia Consult  Date: 02/02/23  Patient Demographics:  Name: [name] [name] DOB:   [birth year] MRN:   [id number]  Planned Surgical Procedure(s):    Case: [id number] Date/Time: 02/03/23 1300   Procedure: XI ROBOT ASSISTED DIAGNOSTIC LAPAROSCOPY, possible right inguinal hernia repair (Right)   Anesthesia type: General   Pre-op diagnosis: right groin pain   Location: [facility] OR ROOM 06 / [place] ORS FOR ANESTHESIA GROUP   Surgeons: Olean Ree, MD   NOTE: Available PAT nursing documentation and vital signs have been reviewed. Clinical nursing staff has updated patient's PMH/PSHx, current medication list, and drug allergies/intolerances to ensure comprehensive history available to assist in medical decision making as it pertains to the aforementioned surgical procedure and anticipated anesthetic course. Extensive review of available clinical information personally performed. [place] PMH and PSHx updated with any diagnoses/procedures that  may have been inadvertently omitted during his intake with the pre-admission testing department's nursing staff.  Clinical Discussion:  [name] [name] is a 62 y.o. male who is submitted for pre-surgical anesthesia review and clearance prior to him undergoing the above procedure. Patient is a Former Smoker (quit 05/2005). Pertinent PMH includes: CAD, diastolic dysfunction, angina, HTN, HLD, T2DM, dyspnea, OSAH (requires nocturnal PAP therapy), GERD (on daily PPI), erectile dysfunction (on PDE5i), polycythemia, mitochondrial myopathy, possible seizures, cervical DDD, neuropathy, umbilical hernia, recurrent RIGHT inguinal hernia, sleep difficulties (on BZO).  Patient is followed by cardiology [name] Anon, MD). He was last seen in the cardiology clinic on 01/29/2023; notes reviewed.  At the time of this clinic visit, patient doing fairly well overall from a cardiovascular  perspective.  Patient previously seen in evaluation for chest pain with associated shortness of breath.  While symptoms continued on an intermittent basis, patient denied any worsening symptoms.  Patient denied any PND, orthopnea, palpitations, significant peripheral edema, vertiginous symptoms, or presyncope/syncope.  Patient with a past medical history significant for cardiovascular diagnoses. Documented physical exam was grossly benign, providing no evidence of acute exacerbation and/or decompensation of the patient's documented cardiovascular conditions.    Patient has undergone multiple cardiac catheterizations:  Diagnostic LEFT heart catheterization on 05/30/2015 revealing normal left ventricular systolic function and end-diastolic pressure.  There was single-vessel CAD noted with a 50% lesion noted within the mid LAD.  Lesion was not found to be significant by wire interrogation.  Decision was made to defer intervention opting for medical management.  Diagnostic LEFT heart catheterization was performed on 04/16/2016 revealing normal left ventricular systolic function and end-diastolic pressure.  Mild persistent single-vessel CAD noted with a 30% stenosis of the mid LAD.  Given nonobstructive nature of the lesion, the decision was made to continue aggressive medical therapy.  Diagnostic left heart catheterization was performed on 08/05/2020 revealing normal left ventricular systolic function with an EF of 55-65%.  There was no change and degree of previously noted coronary artery disease.  There was a 30% lesion noted within the mid LAD.  Recommendations were for continued medical management.  Diagnostic RIGHT/LEFT heart catheterization performed on 07/30/2021 revealing normal left ventricular systolic function.  Mildly calcified coronary artery disease noted; 10% ostial to mid LAD and 30% mid LAD.  Wire analysis of the LAD showed abnormal flow with an FFR of 0.87 and a CFR of 1.8.  Lesion was not  determined to be significant enough to require stenting.  INR was normal at 22, which indicated normal microvascular function.  Filling pressures, pulmonary pressure, and cardiac  output all noted to be normal.  Myocardial perfusion imaging study performed on 01/24/2021 revealed a normal left ventricular systolic function with an EF of 60%.  There was no evidence of stress-induced myocardial ischemia or arrhythmia; no scintigraphic evidence of scar.  CT attenuation correction images showed coronary calcifications predominantly in the LAD and RCA territories with no significant aortic atherosclerosis.  Study determined to be low risk overall.  Long-term cardiac event monitor study performed on 12/24/2022 revealed a predominant underlying sinus rhythm at a rate average rate of 62 bpm; range 45-124 bpm.  There was rare ventricular and atrial ectopy with no significant arrhythmia.  There were no patient triggered events.  Most recent TTE was performed on 01/19/2023 revealing a normal left ventricular systolic function with mild LVH.  LVEF 55-60%. Left ventricular diastolic Doppler parameters consistent with abnormal relaxation (G1DD).  GLS -16.6% there was no significant valvular regurgitation noted.  All transvalvular gradients were noted to be normal providing no evidence suggestive of valvular stenosis.  Blood pressure well controlled at 104/80 mmHg on currently prescribed beta-blocker (carvedilol), ARB (losartan), and diuretic (HCTZ) therapies.  Patient is on ezetimibe for his HLD diagnosis and ASCVD prevention.  Patient has a supply of short acting nitrates (NTG) to use on a as needed basis for recurrent angina/anginal equivalent symptoms; denied recent use.  Of note, in the setting of known cardiovascular diagnoses, it is important to note that patient is on a PDE5i (sildenafil) for and erectile dysfunction diagnosis.  T2DM well-controlled on currently prescribed regimen; last Hgb A1c was 5.4% when checked  on 11/04/2022.Patient does not have an OSAH diagnosis.  Patient with elevated LDL of 169.  Rosuvastatin 10 mg daily added.  No other changes were made to his medication regimen.  Patient to follow-up with outpatient cardiology in 6 months or sooner if needed.  [name] [name] is scheduled for an elective XI ROBOT ASSISTED DIAGNOSTIC LAPAROSCOPY WITH POSSIBLE RIGHT INGUINAL HERNIA REPAIR on 02/03/2023 with Dr. Ardath Sax, MD.  Given patient's past medical history significant for cardiovascular, medical, and genetic conditions, presurgical clearances were sought by the PAT teat. Specialty clearances were obtained as follows:  Per internal medicine Ancil Boozer, MD), "patient is optimized for surgery from medical standpoint and may proceed at an overall MODERATE risk of perioperative complications".   Per cardiology [name] Anon, MD), "the patient has stable cardiac symptoms overall and normal EKG. Most recent cardiac catheterization in 2022 showed mild nonobstructive disease. Per ACC/AHA guidelines, he can proceed with surgery at an overall LOW risk without the need for further cardiovascular testing".   Per medical genetics [name] Natter, MD), "patient may proceed with planned surgery at an overall MODERATE risk of complications. Recommend anesthesia precautions; see attached lett/emergency protocol".    In review of his medication reconciliation, the patient is not noted to be taking any type of anticoagulation or antiplatelet therapies that would need to be held during his perioperative course.  Patient denies previous perioperative complications with anesthesia in the past. In review of the available records, it is noted that patient underwent a general anesthetic course here at [facility] in 12/2021 without documented complications.      01/29/2023   10:02 AM 01/20/2023    8:33 AM 01/08/2023   10:25 AM  Vitals with BMI  Height '5\' 11"'$  '5\' 11"'$  '5\' 11"'$   Weight 225 lbs 4 oz  226 lbs 3 oz 225 lbs 3 oz  BMI 31.43 30.86 57.84  Systolic 696 295  191  Diastolic 80 96 478  Pulse 64 62 64    Providers/Specialists:   NOTE: Primary physician provider listed below. Patient may have been seen by APP or partner within same practice.   PROVIDER ROLE / SPECIALTY LAST Delight Stare, MD General Surgery (Surgeon) 01/20/2023  Steele Sizer, MD Primary Care Provider 12/31/2022  [name], MD Cardiology 11/27/2022  Trula Ore, MD Medical Genetics 11/30/2022  Vernard Gambles, MD Pulmonary Medicine 11/26/2022   Allergies:  Gabapentin, Latex, and Penicillins  Current Home Medications:   No current facility-administered medications for this encounter.    ACCU-CHEK GUIDE test strip   Accu-Chek Softclix Lancets lancets   Alpha Lipoic Acid 200 MG CAPS   blood glucose meter kit and supplies   carvedilol (COREG) 6.25 MG tablet   Coenzyme Q10 (COQ-10) 100 MG CAPS   Dulaglutide (TRULICITY) 2.95 AO/1.3YQ SOPN   ezetimibe (ZETIA) 10 MG tablet   fluticasone (FLONASE) 50 MCG/ACT nasal spray   fluticasone furoate-vilanterol (BREO ELLIPTA) 100-25 MCG/ACT AEPB   ketoconazole (NIZORAL) 2 % shampoo   levOCARNitine (CARNITOR) 330 MG tablet   losartan-hydrochlorothiazide (HYZAAR) 50-12.5 MG tablet   mupirocin ointment (BACTROBAN) 2 %   nitroGLYCERIN (NITROSTAT) 0.4 MG SL tablet   omeprazole (PRILOSEC) 40 MG capsule   pregabalin (LYRICA) 50 MG capsule   ranolazine (RANEXA) 1000 MG SR tablet   Riboflavin (B-2) 100 MG TABS   rosuvastatin (CRESTOR) 10 MG tablet   sildenafil (VIAGRA) 100 MG tablet   temazepam (RESTORIL) 15 MG capsule   Vitamin D, Ergocalciferol, (DRISDOL) 1.25 MG (50000 UNIT) CAPS capsule   History:   Past Medical History:  Diagnosis Date   Allergy    Angina, class III (HCC)    CAD (coronary artery disease)    a. 05/30/2015 cath: LM nl, mLAD 50% (FFR 0.83), LCx minor irregs, RCA minor irregs, EF 55-65%-->Med Rx; b. 03/2016 Cath: LM nl LAD 68m  D1/2/3 min irregs, LCX min irregs, OM1/2/3 nl, RCA min irregs, RPDA/RPAV/RPL1/RPL2 nl-->Med Rx; b. 07/2020 Cath: LM nl, LAD 326mD1/2/3 min irregs, LCX min irregs, RCA min irregs, RPDA/RPAV/RPL1,2 nl, EF 55-65%.   Chronic constipation    Degeneration of intervertebral disc of cervical region    Diastolic dysfunction    a.) TTE 07/22/2017: EF 60-65%, G1DD; b.) TTE 02/14/2021: EF 60-65%, G1DD; c.) TTE 01/19/2023: EF 55-60%, AoV sclerosis without stenosis, G1DD   Dyspnea    Erectile dysfunction    a.) on PDE5i (sildenafil)   GERD (gastroesophageal reflux disease)    Hernia 1991   Hyperlipidemia    Hypertension 2008   Mitochondrial myopathy    a.) [facility] medical genetics specialist ([facility] Dw[facility]ula Ore[facility] Nerve root pain    Neuropathy    Obesity, unspecified 2012   OSA on CPAP    Personal history of tobacco use, presenting hazards to health 2012   Polycythemia    Rectal bleeding 02/11/2017   Recurrent Right Inguinal Hernia Repair 02/09/2011, 04/07/2013   [place]   [place] states neurologist thinking he was having seizures while sleeping-eeg done 10-2022 and was normal   Sleep difficulties    a.) on BZO (temazepam) PRN   [place] 2 [place] mellitus[place]   Umbilical hernia without mention of obstruction or gangrene 02/09/2011   Vitamin D deficiency    Past Surgical History:  Procedure Laterality Date   BACK SURGERY  12/2008   [facility] CARDIAC CATHETERIZATION N/A 05/30/2015   Procedure: Left  Heart Cath;  Surgeon: Wellington Hampshire, MD;  Location: [place] CV LAB;  Service: Cardiovascular;  Laterality: N/A;   CARDIAC CATHETERIZATION N/A 04/16/2016   Procedure: Left Heart Cath and Coronary Angiography;  Surgeon: Wellington Hampshire, MD;  Location: [place] CV LAB;  Service: Cardiovascular;  Laterality: N/A;   COLONOSCOPY  2014   Dr. [name]   COLONOSCOPY WITH PROPOFOL N/A 12/25/2016   Procedure: COLONOSCOPY WITH PROPOFOL;  Surgeon: Jonathon Bellows, MD;   Location: [facility] ENDOSCOPY;  Service: Endoscopy;  Laterality: N/A;   COLONOSCOPY WITH PROPOFOL N/A 01/29/2017   Procedure: COLONOSCOPY WITH PROPOFOL;  Surgeon: Jonathon Bellows, MD;  Location: [facility] ENDOSCOPY;  Service: Endoscopy;  Laterality: N/A;   COLONOSCOPY WITH PROPOFOL N/A 04/11/2020   Procedure: COLONOSCOPY WITH PROPOFOL;  Surgeon: Jonathon Bellows, MD;  Location: [facility] ENDOSCOPY;  Service: Gastroenterology;  Laterality: N/A;   ESOPHAGEAL MANOMETRY N/A 03/04/2022   Procedure: ESOPHAGEAL MANOMETRY (EM);  Surgeon: Mauri Pole, MD;  Location: [facility] ENDOSCOPY;  Service: Gastroenterology;  Laterality: N/A;   ESOPHAGOGASTRODUODENOSCOPY (EGD) WITH PROPOFOL N/A 01/26/2019   Procedure: ESOPHAGOGASTRODUODENOSCOPY (EGD) WITH PROPOFOL;  Surgeon: Lin Landsman, MD;  Location[facility] ENDOSCOPY;  Service: Gastroenterology;  Laterality: N/A;   ESOPHAGOGASTRODUODENOSCOPY (EGD) WITH PROPOFOL N/A 09/10/2021   Procedure: ESOPHAGOGASTRODUODENOSCOPY (EGD) WITH PROPOFOL;  Surgeon: Lin Landsman, MD;  Location: [facility] ENDOSCOPY;  Service: Gastroenterology;  Laterality: N/A;   EVALUATION UNDER ANESTHESIA WITH HEMORRHOIDECTOMY N/A 02/24/2017   Procedure: EXAM UNDER ANESTHESIA WITH POSSIBLE EXCISION OF INTERNAL HEMORRHOIDS;  Surgeon: Olean Ree, MD;  Location: [place] ORS;  Service: General;  Laterality: N/A;   FISSURECTOMY  02/24/2017   Procedure: FISSURECTOMY;  Surgeon: Olean Ree, MD;  Location: [facility] ORS;  Service: General;;   HAND SURGERY Left 2020   HERNIA REPAIR Right 1991   INGUINAL HERNIA REPAIR Right 2012   Dr Teryl Lucy HERNIA REPAIR Right 2014   Dr [name]   INTRAVASCULAR PRESSURE WIRE/FFR STUDY N/A 07/30/2021   Procedure: INTRAVASCULAR PRESSURE WIRE/FFR STUDY;  Surgeon: Wellington Hampshire, MD;  Location: [place] CV LAB;  Service: Cardiovascular;  Laterality: N/A;   LEFT HEART CATH AND CORONARY ANGIOGRAPHY Left 08/05/2020   Procedure: LEFT HEART CATH AND CORONARY ANGIOGRAPHY poss PCI;   Surgeon: Wellington Hampshire, MD;  Location: [place] CV LAB;  Service: Cardiovascular;  Laterality: Left;   MUSCLE BIOPSY Left 12/31/2021   Procedure: LEFT VASTUS LATERALIS BIOPSY;  Surgeon: Meade Maw, MD;  Location: [facility] ORS;  Service: Neurosurgery;  Laterality: Left;   RIGHT/LEFT HEART CATH AND CORONARY ANGIOGRAPHY N/A 07/30/2021   Procedure: RIGHT/LEFT HEART CATH AND CORONARY ANGIOGRAPHY;  Surgeon: Wellington Hampshire, MD;  Location: [place] CV LAB;  Service: Cardiovascular;  Laterality: N/A;   WRIST SURGERY Left    Family History  Problem Relation Age of Onset   Hypertension Mother    Heart Problems Mother    High Cholesterol Mother    Heart Problems Father 77       myocardial infarction   Mental illness Brother    Other Maternal Aunt        COVID   Lung cancer Maternal Aunt    Lung cancer Maternal Aunt    Cancer Maternal Uncle        unknown   Prostate cancer Maternal Uncle    Testicular cancer Paternal Uncle    Thyroid disease Daughter    Breast cancer Cousin    Social History   Tobacco Use   Smoking status: Former  Types: Cigars    Start date: 12/28/2002    Quit date: 06/04/2005    Years since quitting: 17.6   Smokeless tobacco: Never   Tobacco comments:    quit 2006-smoked 1 cigar occ  Vaping Use   Vaping Use: Never used  Substance Use Topics   Alcohol use: Not Currently    Comment: has not had a drink in 8 months 02/2021   Drug use: No    Pertinent Clinical Results:  LABS: Labs reviewed: Acceptable for surgery.  Lab Results  Component Value Date   WBC 8.0 11/04/2022   HGB 18.2 (H) 11/04/2022   HCT 53.7 (H) 11/04/2022   MCV 91.5 11/04/2022   PLT 229 11/04/2022   Lab Results  Component Value Date   NA 137 01/12/2023   K 3.5 01/12/2023   CO2 27 01/12/2023   GLUCOSE 128 (H) 01/12/2023   BUN 14 01/12/2023   CREATININE 0.95 01/12/2023   CALCIUM 8.6 (L) 01/12/2023   EGFR 105 11/04/2022   GFRNONAA >60 01/12/2023    ECG: Date:  07/20/2022 Time ECG obtained: 1130 AM Rate: 79 bpm Rhythm: normal sinus Axis (leads I and aVF): Normal Intervals: PR 166 ms. QRS 90 ms. QTc 449 ms. ST segment and T wave changes: No evidence of acute ST segment elevation or depression Comparison: Similar to previous tracing obtained on 01/19/2022   IMAGING / PROCEDURES: TRANSTHORACIC ECHOCARDIOGRAM performed on 01/19/2023 Left ventricular ejection fraction, by estimation, is 55 to 60%. The  left ventricle has normal function. The left ventricle has no regional  wall motion abnormalities. There is mild left ventricular hypertrophy.  Left ventricular diastolic parameters  are consistent with Grade I diastolic dysfunction (impaired relaxation).  The average left ventricular global longitudinal strain is -16.6 %.  Right ventricular systolic function is normal. The right ventricular size is normal. Tricuspid regurgitation signal is inadequate for assessing  PA pressure.  The mitral valve is normal in structure. No evidence of mitral valve regurgitation. No evidence of mitral stenosis.  The aortic valve is normal in structure. Aortic valve regurgitation is not visualized. Aortic valve sclerosis is present, with no evidence of  aortic valve stenosis.  The inferior vena cava is normal in size with greater than 50% respiratory variability, suggesting right atrial pressure of 3 mmHg.   LONG TERM CARDIAC EVENT MONITOR STUDY performed on 12/24/2022 Predominant underlying sinus rhythm with an average heart rate of 62 bpm; range 45-124 bpm Rare atrial and ventricular ectopy No significant arrhythmia  RIGHT/LEFT HEART CATHETERIZATION AND CORONARY ANGIOGRAPHY performed on 07/30/2021 Mild nonobstructive coronary artery disease involving the LAD which is mildly calcified.  No other obstructive disease. 10% ostial to mid LAD 30% mid LAD Diagonals exhibit mild luminal irregularities LCx and RCA with mild luminal irregularities Right heart catheterization  showed normal filling pressures, normal pulmonary pressure and normal cardiac output.  Fick Cardiac Output 6.36 L/min  Fick Cardiac Output Index 2.91 (L/min)/BSA  RA A Wave 3 mmHg  RA V Wave 2 mmHg  RA Mean 0 mmHg  RV Systolic Pressure 19 mmHg  RV Diastolic Pressure -1 mmHg  RV EDP 2 mmHg  PA Systolic Pressure 21 mmHg  PA Diastolic Pressure 4 mmHg  PA Mean 12 mmHg  PW A Wave 5 mmHg  PW V Wave 5 mmHg  PW Mean 2 mmHg  AO Systolic Pressure 87 mmHg  AO Diastolic Pressure 56 mmHg  AO Mean 71 mmHg  LV Systolic Pressure 88 mmHg  LV Diastolic Pressure 0 mmHg  LV EDP 4 mmHg  AOp Systolic Pressure 80 mmHg  AOp Diastolic Pressure 53 mmHg  AOp Mean Pressure 66 mmHg  LVp Systolic Pressure 79 mmHg  LVp Diastolic Pressure 50 mmHg  LVp EDP Pressure 51 mmHg  QP/QS 1  TPVR Index 4.13 HRUI  TSVR Index 24.4 HRUI  TPVR/TSVR Ratio 0.17  FFR evaluation of the LAD showed abnormal flow with an FFR of 0.87 and CFR of 1.8.  Not significant enough to require stenting.  IMR was normal at 22 indicating normal microvascular function. Recommendations: These cardiac findings do not explain the severity of the patient's symptoms. Recommend continuing medical therapy.   MYOCARDIAL PERFUSION IMAGING STUDY (LEXISCAN) performed on 01/24/2021 Normal left ventricular systolic function with an EF of 60%  No evidence of stress-induced myocardial ischemia or arrhythmia; no scintigraphic evidence of scar CT attenuation correction images reveal coronary calcifications predominantly in the LAD and RCA territories.  No significant aortic atherosclerosis noted. Low risk and without evidence of significant ischemia  Impression and Plan:  [name] [name] has been referred for pre-anesthesia review and clearance prior to him undergoing the planned anesthetic and procedural courses. Available labs, pertinent testing, and imaging results were personally reviewed by me in preparation for upcoming operative/procedural course.  [facility] medical record has been updated following extensive record review and patient interview with PAT staff.   This patient has been appropriately cleared by internal medicine (MODERATE), cardiology (LOW), and medical genetics (MODERATE) with the with the individually indicated risk of significant perioperative complications.  Protocol from medical genetics discussed with attending anesthesiologist [name] Maria, MD).  Anesthesiologist noted that it was appropriate to proceed with the provided information in hand.  Copy of protocol placed on patient's chart for the OR to be reviewed by surgical/anesthetic team on the day of his procedure. Based on clinical review performed today (02/02/23), barring any significant acute changes in the patient's overall condition, it is anticipated that he will be able to proceed with the planned surgical intervention. Any acute changes in clinical condition may necessitate his procedure being postponed and/or cancelled. Patient will meet with anesthesia team (MD and/or CRNA) on the day of his procedure for preoperative evaluation/assessment. Questions regarding anesthetic course will be fielded at that time.   Pre-surgical instructions were reviewed with the patient during his PAT appointment, and questions were fielded to satisfaction by PAT clinical staff. He has been instructed on which medications that he will need to hold prior to surgery, as well as the ones that have been deemed safe/appropriate to take of the day of his procedure. As part of the general education provided by PAT, patient made aware both verbally and in writing, that he will need to abstain from the use of any illegal substances during his perioperative course.  He was advised that failure to follow provided instructions could necessitate the need for case cancellation or result serious perioperative complications up to and including death. Patient encouraged to contact PAT and/or his surgeon's office to  discuss any questions or concerns that may arise prior to surgery; verbalized understanding.   Honor Loh, MSN, APRN, FNP-C, CEN [facility]  Peri-operative Services Nurse Practitioner Phone: [phone] Fax: [phone] 02/02/23 9:38 AM  NOTE: This note has been prepared using Dragon dictation software. Despite my best ability to proofread, there is always the potential that unintentional transcriptional errors may still occur from this process.

## 2023-01-29 ENCOUNTER — Ambulatory Visit: Payer: Medicaid Other | Admitting: Cardiovascular Disease

## 2023-01-29 ENCOUNTER — Ambulatory Visit: Payer: Medicaid Other | Attending: Cardiovascular Disease | Admitting: Cardiovascular Disease

## 2023-01-29 ENCOUNTER — Encounter: Payer: Self-pay | Admitting: Cardiovascular Disease

## 2023-01-29 VITALS — BP 104/80 | HR 64 | Ht 71.0 in | Wt 225.2 lb

## 2023-01-29 DIAGNOSIS — E785 Hyperlipidemia, unspecified: Secondary | ICD-10-CM

## 2023-01-29 DIAGNOSIS — Z01810 Encounter for preprocedural cardiovascular examination: Secondary | ICD-10-CM | POA: Diagnosis not present

## 2023-01-29 DIAGNOSIS — I25118 Atherosclerotic heart disease of native coronary artery with other forms of angina pectoris: Secondary | ICD-10-CM | POA: Diagnosis not present

## 2023-01-29 DIAGNOSIS — I10 Essential (primary) hypertension: Secondary | ICD-10-CM | POA: Diagnosis not present

## 2023-01-29 MED ORDER — ROSUVASTATIN CALCIUM 10 MG PO TABS: 10.0000 mg | ORAL_TABLET | Freq: Every day | ORAL | 3 refills | Status: DC

## 2023-01-29 NOTE — Patient Instructions (Addendum)
Medication Instructions:  START Rosuvastatin 10 mg once daily  *If you need a refill on your cardiac medications before your next appointment, please call your pharmacy*   Lab Work: Your provider would like for you to return in 2 months to have the following labs drawn: fasting Lipid and Liver.   Please go to the [facility] entrance and check in at the front desk.  You do not need an appointment.  They are open from 7am-6 pm.  You will need to be fasting.    If you have labs (blood work) drawn today and your tests are completely normal, you will receive your results only by: [place] (if you have MyChart) OR A paper copy in the mail If you have any lab test that is abnormal or we need to change your treatment, we will call you to review the results.   Testing/Procedures: None ordered   Follow-Up: At [facility], you and your health needs are our priority.  As part of our continuing mission to provide you with exceptional heart care, we have created designated Provider Care Teams.  These Care Teams include your primary Cardiologist (physician) and Advanced Practice Providers (APPs -  Physician Assistants and Nurse Practitioners) who all work together to provide you with the care you need, when you need it.  We recommend signing up for the patient portal called "MyChart".  Sign up information is provided on this After Visit Summary.  MyChart is used to connect with patients for Virtual Visits (Telemedicine).  Patients are able to view lab/test results, encounter notes, upcoming appointments, etc.  Non-urgent messages can be sent to your provider as well.   To learn more about what you can do with MyChart, go to [email or web address].    Your next appointment:   6 month(s)  Provider:   You may see [name], MD or one of the following Advanced Practice Providers on your designated Care Team:   Murray Hodgkins, NP Christell Faith, PA-C Cadence Kathlen Mody,  PA-C Gerrie Nordmann, NP

## 2023-01-29 NOTE — Progress Notes (Signed)
Cardiology Office Note   Date:  01/29/2023   ID:  [name], [name] 02-15-1961, MRN [id number]  PCP:  Steele Sizer, MD  Cardiologist:   [name], MD   Chief Complaint  Patient presents with   Other    F/u echo c/o chest tightness and weakness/sob with exertion. Cardiac clearance for upcoming procedure. Meds reviewed verbally with pt.      History of Present Illness: [name] [name] is a 62 y.o. male who presents for a follow-up visit regarding mild -moderate one-vessel coronary artery disease with stable angina and suspected endothelial dysfunction. He has known history of type 2 diabetes, hypertension, hyperlipidemia and obesity. He is a previous smoker. Cardiac catheterization in June 2016 showed moderate mid LAD stenosis (50%) with an FFR ratio of 0.83. LV systolic function was normal with normal left ventricular end-diastolic pressure. He had worsening angina in April 2017. Repeat cardiac catheterization showed improvement in mid LAD stenosis to 30% and no evidence of obstructive coronary artery disease.  He does have sleep apnea on CPAP . The patient had recurrent episodes of loss of consciousness of unclear etiology in 2018.  Echocardiogram showed normal ejection fraction.  2-week outpatient monitor showed no significant arrhythmia.  It was felt that his episodes are not cardiac in origin and he has been evaluated by neurology but no clear etiology was identified according to him.    The patient did have recurrent concussions growing up when he played sports.  Most recent cardiac catheterization in August 2022 showed mild nonobstructive one-vessel coronary artery disease with 30% stenosis in the mid LAD.  Ejection fraction was normal with mildly elevated left ventricular end-diastolic pressure.  Physiologic testing was done in the Cath Lab.  FFR of the LAD was mildly abnormal at 0.87 with CFR of 1.8 IMR was normal at 22 indicating normal microvascular function. He was  subsequently diagnosed with mitochondrial myopathy and had significant muscle weakness and atrophy as a result.  He lost weight and has been following up at [facility] for this.  He reports some improvement and stabilization of his symptoms overall.  No worsening chest pain or shortness of breath.  He is scheduled for right inguinal surgery in the near future.     Past Medical History:  Diagnosis Date   Allergy    Angina, class III (HCC)    CAD (coronary artery disease)    a. 05/30/2015 cath: LM nl, mLAD 50% (FFR 0.83), LCx minor irregs, RCA minor irregs, EF 55-65%-->Med Rx; b. 03/2016 Cath: LM nl LAD 67m D1/2/3 min irregs, LCX min irregs, OM1/2/3 nl, RCA min irregs, RPDA/RPAV/RPL1/RPL2 nl-->Med Rx; b. 07/2020 Cath: LM nl, LAD 365mD1/2/3 min irregs, LCX min irregs, RCA min irregs, RPDA/RPAV/RPL1,2 nl, EF 55-65%.   Chronic constipation    Degeneration of intervertebral disc of cervical region    Diastolic dysfunction    a.) TTE 07/22/2017: EF 60-65%, G1DD; b.) TTE 02/14/2021: EF 60-65%, G1DD; c.) TTE 01/19/2023: EF 55-60%, AoV sclerosis without stenosis, G1DD   Dyspnea    Erectile dysfunction    a.) on PDE5i (sildenafil)   GERD (gastroesophageal reflux disease)    Hernia 1991   Hyperlipidemia    Hypertension 2008   Mitochondrial myopathy    a.) [facility] medical genetics [facility] Dw[facility]ula OreMD[facility] Nerve root pain    Neuropathy    Obesity, unspecified 2012   OSA on CPAP    Personal history of tobacco use, presenting hazards  to health 2012   Polycythemia    Rectal bleeding 02/11/2017   Recurrent Right Inguinal Hernia Repair 02/09/2011, 04/07/2013   Seizure ([place])    pt states neurologist thinking he was having seizures while sleeping-eeg done 10-2022 and was normal   T2DM (type 2 diabetes mellitus) [place])    Umbilical hernia without mention of obstruction or gangrene 02/09/2011   Vitamin D deficiency     Past Surgical History:  Procedure Laterality Date   BACK SURGERY   12/2008   X2-LUMBAR   CARDIAC CATHETERIZATION N/A 05/30/2015   Procedure: Left Heart Cath;  Surgeon: Wellington Hampshire, MD;  Location: [place] [facility];  Service: Cardiovascular;  Laterality: N/A;   CARDIAC CATHETERIZATION N/A 04/16/2016   Procedure: Left Heart Cath and Coronary Angiography;  Surgeon: Wellington Hampshire, MD;  Location: [place] [facility];  Service: Cardiovascular;  Laterality: N/A;   COLONOSCOPY  2014   Dr. [name]   COLONOSCOPY WITH PROPOFOL N/A 12/25/2016   Procedure: COLONOSCOPY WITH PROPOFOL;  Surgeon: Jonathon Bellows, MD;  Location: [facility] ENDOSCOPY;  Service: Endoscopy;  Laterality: N/A;   COLONOSCOPY WITH PROPOFOL N/A 01/29/2017   Procedure: COLONOSCOPY WITH PROPOFOL;  Surgeon: Jonathon Bellows, MD;  Location: [facility] ENDOSCOPY;  Service: Endoscopy;  Laterality: N/A;   COLONOSCOPY WITH PROPOFOL N/A 04/11/2020   Procedure: COLONOSCOPY WITH PROPOFOL;  Surgeon: Jonathon Bellows, MD;  Location: [facility]COPY;  Service: Gastroenterology;  Laterality: N/A;   ESOPHAGEAL MANOMETRY N/A 03/04/2022   Procedure: ESOPHAGEAL MANOMETRY (EM);  Surgeon: Mauri Pole, MD;  Location: [facility] ENDOSCOPY;  Service: Gastroenterology;  Laterality: N/A;   ESOPHAGOGASTRODUODENOSCOPY (EGD) WITH PROPOFOL N/A 01/26/2019   Procedure: ESOPHAGOGASTRODUODENOSCOPY (EGD) WITH PROPOFOL;  Surgeon: Lin Landsman, MD;  Location[facility] ENDOSCOPY;  Service: Gastroenterology;  Laterality: N/A;   ESOPHAGOGASTRODUODENOSCOPY (EGD) WITH PROPOFOL N/A 09/10/2021   Procedure: ESOPHAGOGASTRODUODENOSCOPY (EGD) WITH PROPOFOL;  Surgeon: Lin Landsman, MD;  Location: [facility] ENDOSCOPY;  Service: Gastroenterology;  Laterality: N/A;   EVALUATION UNDER ANESTHESIA WITH HEMORRHOIDECTOMY N/A 02/24/2017   Procedure: EXAM UNDER ANESTHESIA WITH POSSIBLE EXCISION OF INTERNAL HEMORRHOIDS;  Surgeon: Olean Ree, MD;  Location: [place] [facility];  Service: General;  Laterality: N/A;   FISSURECTOMY  02/24/2017   Procedure: FISSURECTOMY;  Surgeon: Olean Ree, MD;  Location: [facility] ORS;  Service: General;;   HAND SURGERY Left 2020   HERNIA REPAIR Right 1991   INGUINAL HERNIA REPAIR Right 2012   Dr Teryl Lucy HERNIA REPAIR Right 2014   Dr [name]   INTRAVASCULAR PRESSURE WIRE/FFR STUDY N/A 07/30/2021   Procedure: INTRAVASCULAR PRESSURE WIRE/FFR STUDY;  Surgeon: Wellington Hampshire, MD;  Location: [employer] [facility];  Service: Cardiovascular;  Laterality: N/A;   LEFT HEART CATH AND CORONARY ANGIOGRAPHY Left 08/05/2020   Procedure: LEFT HEART CATH AND CORONARY ANGIOGRAPHY poss PCI;  Surgeon: Wellington Hampshire, MD;  Location: [place] [facility];  Service: Cardiovascular;  Laterality: Left;   MUSCLE BIOPSY Left 12/31/2021   Procedure: LEFT VASTUS LATERALIS BIOPSY;  Surgeon: Meade Maw, MD;  Location: [facility] ORS;  Service: Neurosurgery;  Laterality: Left;   RIGHT/LEFT HEART CATH AND CORONARY ANGIOGRAPHY N/A 07/30/2021   Procedure: RIGHT/LEFT HEART CATH AND CORONARY ANGIOGRAPHY;  Surgeon: Wellington Hampshire, MD;  Location: [place] [facility];  Service: Cardiovascular;  Laterality: N/A;   WRIST SURGERY Left      Current Outpatient Medications  Medication Sig Dispense Refill   ACCU-CHEK GUIDE test strip USE TO TEST BLOOD SUGAR THREE TIMES DAILY AS NEEDED 100 strip  1   Accu-Chek Softclix Lancets lancets TEST TWICE DAILY 100 each 3   Alpha Lipoic Acid 200 MG CAPS Take 200 mg by mouth daily.     blood glucose meter kit and supplies 1 each by Other route in the morning and at bedtime. Dispense based on patient and insurance preference. Use up to two  times daily. (FOR ICD-10 E10.9, E11.9). 1 each 0   carvedilol (COREG) 6.25 MG tablet TAKE 1 TABLET(6.25 MG) BY MOUTH TWICE DAILY (Patient taking differently: Take 6.25 mg by mouth 2 (two) times daily with a meal.) 180 tablet 1   Coenzyme Q10 (COQ-10) 100 MG CAPS Take 100 mg by mouth daily.     Dulaglutide (TRULICITY) 2.26 JF/3.5KT SOPN Inject 0.75 mg into the skin See admin instructions. Every  other week-takes on MONDAYS     ezetimibe (ZETIA) 10 MG tablet Take 1 tablet (10 mg total) by mouth daily. (Patient taking differently: Take 10 mg by mouth every morning.) 90 tablet 3   fluticasone (FLONASE) 50 MCG/ACT nasal spray Place 1 spray into both nostrils daily as needed for allergies or rhinitis.     fluticasone furoate-vilanterol (BREO ELLIPTA) 100-25 MCG/ACT AEPB Inhale 1 puff into the lungs daily. (Patient taking differently: Inhale 1 puff into the lungs every morning.) 60 each 6   ketoconazole (NIZORAL) 2 % shampoo Apply 1 Application topically 3 (three) times a week. Wash scalp/body 3 times a week as needed for alres, let sit 5 minutes before rinsing off, avoid eyes. May use once monthly for maintenance. (Patient taking differently: Apply 1 Application topically 2 (two) times a week. Wash scalp/body 3 times a week as needed for alres, let sit 5 minutes before rinsing off, avoid eyes. May use once monthly for maintenance.) 120 mL 11   levOCARNitine (CARNITOR) 330 MG tablet Take 330 mg by mouth 2 (two) times daily.     losartan-hydrochlorothiazide (HYZAAR) 50-12.5 MG tablet TAKE 1 TABLET BY MOUTH DAILY (Patient taking differently: Take 1 tablet by mouth every morning.) 90 tablet 2   mupirocin ointment (BACTROBAN) 2 % Apply 1 application topically daily. Qd to excision site (Patient taking differently: Apply 1 application  topically daily as needed (skin irritation.). Qd to excision site) 22 g 1   nitroGLYCERIN (NITROSTAT) 0.4 MG SL tablet Place 1 tablet (0.4 mg total) under the tongue every 5 (five) minutes as needed for chest pain.     omeprazole (PRILOSEC) 40 MG capsule TAKE 1 CAPSULE(40 MG) BY MOUTH TWICE DAILY BEFORE A MEAL (Patient taking differently: 40 mg 2 (two) times daily.) 60 capsule 5   pregabalin (LYRICA) 50 MG capsule Take 50 mg by mouth every morning.     ranolazine (RANEXA) 1000 MG SR tablet TAKE 1 TABLET(1000 MG) BY MOUTH TWICE DAILY (Patient taking differently: 1,000 mg 2  (two) times daily.) 180 tablet 0   Riboflavin (B-2) 100 MG TABS Take 100 mg by mouth daily.     rosuvastatin (CRESTOR) 10 MG tablet Take 1 tablet (10 mg total) by mouth daily. 90 tablet 3   sildenafil (VIAGRA) 100 MG tablet Take 1 tablet (100 mg total) by mouth daily as needed for erectile dysfunction. 30 tablet 1   temazepam (RESTORIL) 15 MG capsule TAKE 1 CAPSULE(15 MG) BY MOUTH AT BEDTIME AS NEEDED FOR SLEEP (Patient taking differently: Take 15 mg by mouth at bedtime.) 90 capsule 0   Vitamin D, Ergocalciferol, (DRISDOL) 1.25 MG (50000 UNIT) CAPS capsule Take 1 capsule (50,000 Units total) by mouth every 7 (  seven) days. (Patient taking differently: Take 50,000 Units by mouth every 7 (seven) days. mondays) 12 capsule 1   No current facility-administered medications for this visit.    Allergies:   Gabapentin, Latex, and Penicillins    Social History:  The patient  reports that he quit smoking about 17 years ago. His smoking use included cigars. He started smoking about 20 years ago. He has never used smokeless tobacco. He reports that he does not currently use alcohol. He reports that he does not use drugs.   Family History:  The patient's family history includes Breast cancer in his cousin; Cancer in his maternal uncle; Heart Problems in his mother; Heart Problems (age of onset: 34) in his father; High Cholesterol in his mother; Hypertension in his mother; Lung cancer in his maternal aunt and maternal aunt; Mental illness in his brother; Other in his maternal aunt; Prostate cancer in his maternal uncle; Testicular cancer in his paternal uncle; Thyroid disease in his daughter.    ROS:  Please see the history of present illness.   Otherwise, review of systems are positive for none.   All other systems are reviewed and negative.    PHYSICAL EXAM: VS:  BP 104/80 (BP Location: Left Arm, Patient Position: Sitting, Cuff Size: Large)   Pulse 64   Ht '5\' 11"'$  (1.803 m)   Wt 225 lb 4 oz (102.2 kg)    SpO2 97%   BMI 31.42 kg/m  , BMI Body mass index is 31.42 kg/m. GEN: Well nourished, well developed, in no acute distress  HEENT: normal  Neck: no JVD, carotid bruits, or masses Cardiac: RRR; no murmurs, rubs, or gallops,no edema  Respiratory:  clear to auscultation bilaterally, normal work of breathing GI: soft, nontender, nondistended, + BS MS: no deformity or atrophy  Skin: warm and dry, no rash Neuro:  Strength and sensation are intact Psych: euthymic mood, full affect Right radial pulses normal.   EKG:  EKG is  ordered today.  EKG shows normal sinus rhythm with no significant ST or T wave changes.   Recent Labs: 11/04/2022: ALT 14; Hemoglobin 18.2; Platelets 229 01/12/2023: BUN 14; Creatinine, Ser 0.95; Potassium 3.5; Sodium 137    Lipid Panel    Component Value Date/Time   CHOL 236 (H) 11/04/2022 1106   CHOL 167 05/08/2016 0803   TRIG 95 11/04/2022 1106   HDL 46 11/04/2022 1106   HDL 52 05/08/2016 0803   CHOLHDL 5.1 (H) 11/04/2022 1106   LDLCALC 169 (H) 11/04/2022 1106      Wt Readings from Last 3 Encounters:  01/29/23 225 lb 4 oz (102.2 kg)  01/20/23 226 lb 3.2 oz (102.6 kg)  01/08/23 225 lb 3.2 oz (102.2 kg)        ASSESSMENT AND PLAN:  1.  Coronary artery disease involving native coronary arteries with other forms of angina: Suspected endothelial dysfunction.  No evidence of obstructive coronary artery disease on most recent cardiac catheterization.  His symptoms are overall stable with medications.  2. Hyperlipidemia: He is no longer on a statin for unclear reasons but suspect it was discontinued when he was having muscle issues.  He is currently taking Zetia 10 mg once daily.  Most recent lipid profile showed an LDL of 169.  I elected to add small dose rosuvastatin 10 mg daily.  Recheck lipid and liver profile in 2 months.  3. Essential hypertension: Blood pressure is well controlled.  4.  Type 2 diabetes: Well controlled with most recent hemoglobin  A1c of 6.  5.  Preop cardiovascular evaluation for hernia surgery: The patient has stable cardiac symptoms overall and normal EKG.  Most recent cardiac catheterization in 2022 showed mild nonobstructive disease.  He can proceed with surgery at an overall low risk.   Disposition:   FU with me in 6 months  Signed,  [name], MD  01/29/2023 1:49 PM    [place]

## 2023-02-02 ENCOUNTER — Encounter: Payer: Self-pay | Admitting: Surgery

## 2023-02-03 ENCOUNTER — Ambulatory Visit: Payer: Medicaid Other | Admitting: Urgent Care

## 2023-02-03 ENCOUNTER — Other Ambulatory Visit: Payer: Self-pay

## 2023-02-03 ENCOUNTER — Ambulatory Visit
Admission: RE | Admit: 2023-02-03 | Discharge: 2023-02-03 | Disposition: A | Discharge status: Home / Self Care | Payer: Medicaid Other | Attending: Surgery | Admitting: Surgery

## 2023-02-03 ENCOUNTER — Encounter: Payer: Self-pay | Admitting: Surgery

## 2023-02-03 ENCOUNTER — Encounter
Admission: RE | Disposition: A | Discharge status: Home / Self Care | Payer: Self-pay | Source: Home / Self Care | Attending: Surgery

## 2023-02-03 DIAGNOSIS — R1031 Right lower quadrant pain: Secondary | ICD-10-CM | POA: Diagnosis not present

## 2023-02-03 DIAGNOSIS — E669 Obesity, unspecified: Secondary | ICD-10-CM | POA: Insufficient documentation

## 2023-02-03 DIAGNOSIS — E785 Hyperlipidemia, unspecified: Secondary | ICD-10-CM | POA: Diagnosis not present

## 2023-02-03 DIAGNOSIS — Z6831 Body mass index (BMI) 31.0-31.9, adult: Secondary | ICD-10-CM | POA: Insufficient documentation

## 2023-02-03 DIAGNOSIS — I25119 Atherosclerotic heart disease of native coronary artery with unspecified angina pectoris: Secondary | ICD-10-CM | POA: Diagnosis not present

## 2023-02-03 DIAGNOSIS — G4730 Sleep apnea, unspecified: Secondary | ICD-10-CM | POA: Insufficient documentation

## 2023-02-03 DIAGNOSIS — Z8719 Personal history of other diseases of the digestive system: Secondary | ICD-10-CM | POA: Insufficient documentation

## 2023-02-03 DIAGNOSIS — I2510 Atherosclerotic heart disease of native coronary artery without angina pectoris: Secondary | ICD-10-CM | POA: Insufficient documentation

## 2023-02-03 DIAGNOSIS — K219 Gastro-esophageal reflux disease without esophagitis: Secondary | ICD-10-CM | POA: Diagnosis not present

## 2023-02-03 DIAGNOSIS — I10 Essential (primary) hypertension: Secondary | ICD-10-CM | POA: Diagnosis not present

## 2023-02-03 DIAGNOSIS — Z01818 Encounter for other preprocedural examination: Secondary | ICD-10-CM

## 2023-02-03 DIAGNOSIS — Z87891 Personal history of nicotine dependence: Secondary | ICD-10-CM | POA: Diagnosis not present

## 2023-02-03 DIAGNOSIS — E119 Type 2 diabetes mellitus without complications: Secondary | ICD-10-CM | POA: Diagnosis not present

## 2023-02-03 HISTORY — DX: Type 2 diabetes mellitus without complications: E11.9

## 2023-02-03 HISTORY — DX: Sleep disorder, unspecified: G47.9

## 2023-02-03 HISTORY — DX: Obstructive sleep apnea (adult) (pediatric): G47.33

## 2023-02-03 HISTORY — PX: XI ROBOT ASSISTED DIAGNOSTIC LAPAROSCOPY: SHX6815

## 2023-02-03 HISTORY — DX: Male erectile dysfunction, unspecified: N52.9

## 2023-02-03 LAB — BASIC METABOLIC PANEL
Anion gap: 10 (ref 5–15)
BUN: 14 mg/dL (ref 6–20)
CO2: 26 mmol/L (ref 22–32)
Calcium: 8.5 mg/dL — ABNORMAL LOW (ref 8.9–10.3)
Chloride: 101 mmol/L (ref 98–111)
Creatinine, Ser: 0.98 mg/dL (ref 0.61–1.24)
GFR, Estimated: >60 mL/min (ref >60)
Glucose, Bld: 93 mg/dL (ref 70–99)
Potassium: 3.1 mmol/L — ABNORMAL LOW (ref 3.5–5.1)
Sodium: 137 mmol/L (ref 135–145)

## 2023-02-03 LAB — GLUCOSE, CAPILLARY
Glucose-Capillary: 97 mg/dL (ref 70–99)
Glucose-Capillary: 140 mg/dL — ABNORMAL HIGH (ref 70–99)

## 2023-02-03 SURGERY — LAPAROSCOPY, DIAGNOSTIC, ROBOT-ASSISTED
Anesthesia: General | Site: Abdomen | Laterality: Right

## 2023-02-03 MED ORDER — CHLORHEXIDINE GLUCONATE CLOTH 2 % EX PADS: 6.0000 | MEDICATED_PAD | Freq: Once | CUTANEOUS | Status: DC

## 2023-02-03 MED ORDER — ONDANSETRON HCL 4 MG/2ML IJ SOLN
INTRAMUSCULAR | Status: DC | PRN
  Administered 2023-02-03 (×2): 4 mg via INTRAVENOUS

## 2023-02-03 MED ORDER — FENTANYL CITRATE (PF) 100 MCG/2ML IJ SOLN
INTRAMUSCULAR | Status: AC
  Filled 2023-02-03: qty 2

## 2023-02-03 MED ORDER — GLYCOPYRROLATE 0.2 MG/ML IJ SOLN
INTRAMUSCULAR | Status: DC | PRN
  Administered 2023-02-03: .2 mg via INTRAVENOUS

## 2023-02-03 MED ORDER — EPHEDRINE SULFATE (PRESSORS) 50 MG/ML IJ SOLN
INTRAMUSCULAR | Status: DC | PRN
  Administered 2023-02-03: 5 mg via INTRAVENOUS

## 2023-02-03 MED ORDER — CHLORHEXIDINE GLUCONATE 0.12 % MT SOLN: 15.0000 mL | Freq: Once | OROMUCOSAL | Status: AC

## 2023-02-03 MED ORDER — CEFAZOLIN SODIUM-DEXTROSE 2-4 GM/100ML-% IV SOLN
2.0000 g | Freq: Once | INTRAVENOUS | Status: AC
  Administered 2023-02-03: 2 g via INTRAVENOUS

## 2023-02-03 MED ORDER — OXYCODONE HCL 5 MG PO TABS: 5.0000 mg | ORAL_TABLET | Freq: Four times a day (QID) | ORAL | 0 refills | Status: DC | PRN

## 2023-02-03 MED ORDER — PHENYLEPHRINE HCL-NACL 20-0.9 MG/250ML-% IV SOLN
INTRAVENOUS | Status: DC | PRN
  Administered 2023-02-03: 20 ug/min via INTRAVENOUS

## 2023-02-03 MED ORDER — OXYCODONE HCL 5 MG PO TABS
ORAL_TABLET | ORAL | Status: AC
  Administered 2023-02-03: 5 mg via ORAL
  Filled 2023-02-03: qty 1

## 2023-02-03 MED ORDER — LIDOCAINE HCL (CARDIAC) PF 100 MG/5ML IV SOSY
PREFILLED_SYRINGE | INTRAVENOUS | Status: DC | PRN
  Administered 2023-02-03: 100 mg via INTRAVENOUS

## 2023-02-03 MED ORDER — DEXAMETHASONE SODIUM PHOSPHATE 10 MG/ML IJ SOLN
INTRAMUSCULAR | Status: DC | PRN
  Administered 2023-02-03: 5 mg via INTRAVENOUS

## 2023-02-03 MED ORDER — ROCURONIUM BROMIDE 100 MG/10ML IV SOLN
INTRAVENOUS | Status: DC | PRN
  Administered 2023-02-03: 50 mg via INTRAVENOUS

## 2023-02-03 MED ORDER — CHLORHEXIDINE GLUCONATE 0.12 % MT SOLN
OROMUCOSAL | Status: AC
  Administered 2023-02-03: 15 mL via OROMUCOSAL
  Filled 2023-02-03: qty 15

## 2023-02-03 MED ORDER — BUPIVACAINE HCL (PF) 0.25 % IJ SOLN
INTRAMUSCULAR | Status: AC
  Filled 2023-02-03: qty 30

## 2023-02-03 MED ORDER — SUGAMMADEX SODIUM 200 MG/2ML IV SOLN
INTRAVENOUS | Status: DC | PRN
  Administered 2023-02-03: 220 mg via INTRAVENOUS

## 2023-02-03 MED ORDER — IBUPROFEN 600 MG PO TABS: 600.0000 mg | ORAL_TABLET | Freq: Three times a day (TID) | ORAL | 0 refills | Status: DC | PRN

## 2023-02-03 MED ORDER — FENTANYL CITRATE (PF) 100 MCG/2ML IJ SOLN
INTRAMUSCULAR | Status: DC | PRN
  Administered 2023-02-03 (×2): 50 ug via INTRAVENOUS

## 2023-02-03 MED ORDER — ACETAMINOPHEN 500 MG PO TABS
ORAL_TABLET | ORAL | Status: AC
  Administered 2023-02-03: 1000 mg via ORAL
  Filled 2023-02-03: qty 2

## 2023-02-03 MED ORDER — SODIUM CHLORIDE 0.9 % IV SOLN: INTRAVENOUS | Status: DC

## 2023-02-03 MED ORDER — GABAPENTIN 300 MG PO CAPS: 300.0000 mg | ORAL_CAPSULE | ORAL | Status: DC

## 2023-02-03 MED ORDER — ORAL CARE MOUTH RINSE: 15.0000 mL | Freq: Once | OROMUCOSAL | Status: AC

## 2023-02-03 MED ORDER — 0.9 % SODIUM CHLORIDE (POUR BTL) OPTIME
TOPICAL | Status: DC | PRN
  Administered 2023-02-03: 500 mL

## 2023-02-03 MED ORDER — EPINEPHRINE PF 1 MG/ML IJ SOLN
INTRAMUSCULAR | Status: AC
  Filled 2023-02-03: qty 1

## 2023-02-03 MED ORDER — MIDAZOLAM HCL 2 MG/2ML IJ SOLN
INTRAMUSCULAR | Status: AC
  Filled 2023-02-03: qty 2

## 2023-02-03 MED ORDER — BUPIVACAINE LIPOSOME 1.3 % IJ SUSP: 20.0000 mL | Freq: Once | INTRAMUSCULAR | Status: DC

## 2023-02-03 MED ORDER — OXYCODONE HCL 5 MG/5ML PO SOLN: 5.0000 mg | Freq: Once | ORAL | Status: AC | PRN

## 2023-02-03 MED ORDER — BUPIVACAINE HCL (PF) 0.5 % IJ SOLN
INTRAMUSCULAR | Status: AC
  Filled 2023-02-03: qty 30

## 2023-02-03 MED ORDER — DEXAMETHASONE SODIUM PHOSPHATE 10 MG/ML IJ SOLN: INTRAMUSCULAR | Status: DC | PRN

## 2023-02-03 MED ORDER — BUPIVACAINE-EPINEPHRINE 0.25% -1:200000 IJ SOLN
INTRAMUSCULAR | Status: DC | PRN
  Administered 2023-02-03: 50 mL via INTRAMUSCULAR

## 2023-02-03 MED ORDER — CHLORHEXIDINE GLUCONATE CLOTH 2 % EX PADS
6.0000 | MEDICATED_PAD | Freq: Once | CUTANEOUS | Status: AC
  Administered 2023-02-03: 6 via TOPICAL

## 2023-02-03 MED ORDER — ACETAMINOPHEN 500 MG PO TABS: 1000.0000 mg | ORAL_TABLET | Freq: Four times a day (QID) | ORAL | Status: DC | PRN

## 2023-02-03 MED ORDER — OXYCODONE HCL 5 MG PO TABS: 5.0000 mg | ORAL_TABLET | Freq: Once | ORAL | Status: AC | PRN

## 2023-02-03 MED ORDER — FENTANYL CITRATE (PF) 100 MCG/2ML IJ SOLN
25.0000 ug | INTRAMUSCULAR | Status: DC | PRN
  Administered 2023-02-03: 25 ug via INTRAVENOUS

## 2023-02-03 MED ORDER — PROPOFOL 10 MG/ML IV BOLUS
INTRAVENOUS | Status: DC | PRN
  Administered 2023-02-03: 150 mg via INTRAVENOUS

## 2023-02-03 MED ORDER — CEFAZOLIN SODIUM-DEXTROSE 2-4 GM/100ML-% IV SOLN
INTRAVENOUS | Status: AC
  Filled 2023-02-03: qty 100

## 2023-02-03 MED ORDER — MIDAZOLAM HCL 2 MG/2ML IJ SOLN
INTRAMUSCULAR | Status: DC | PRN
  Administered 2023-02-03: 2 mg via INTRAVENOUS

## 2023-02-03 MED ORDER — ACETAMINOPHEN 500 MG PO TABS: 1000.0000 mg | ORAL_TABLET | ORAL | Status: AC

## 2023-02-03 MED ORDER — DEXTROSE-NACL 5-0.45 % IV SOLN: INTRAVENOUS | Status: DC

## 2023-02-03 MED ORDER — BUPIVACAINE LIPOSOME 1.3 % IJ SUSP
INTRAMUSCULAR | Status: AC
  Filled 2023-02-03: qty 20

## 2023-02-03 SURGICAL SUPPLY — 82 items
CANNULA REDUC XI 12-8 STAPL (CANNULA) ×1
CANNULA REDUCER 12-8 DVNC XI (CANNULA) ×1 IMPLANT
COVER MAYO STAND REUSABLE (DRAPES) IMPLANT
DERMABOND ADVANCED .7 DNX12 (GAUZE/BANDAGES/DRESSINGS) ×1 IMPLANT
DRAPE ARM DVNC X/XI (DISPOSABLE) ×4 IMPLANT
DRAPE COLUMN DVNC XI (DISPOSABLE) ×1 IMPLANT
DRAPE DA VINCI XI ARM (DISPOSABLE)
DRAPE DA VINCI XI COLUMN (DISPOSABLE)
ELECT CAUTERY BLADE TIP 2.5 (TIP) ×1
ELECT EZSTD 165MM 6.5IN (MISCELLANEOUS)
ELECT REM PT RETURN 9FT ADLT (ELECTROSURGICAL) ×1
ELECTRODE CAUTERY BLDE TIP 2.5 (TIP) ×1 IMPLANT
ELECTRODE EZSTD 165MM 6.5IN (MISCELLANEOUS) IMPLANT
ELECTRODE REM PT RTRN 9FT ADLT (ELECTROSURGICAL) ×1 IMPLANT
GLOVE SURG SYN 7.0 (GLOVE) ×3 IMPLANT
GLOVE SURG SYN 7.0 PF PI (GLOVE) ×3 IMPLANT
GLOVE SURG SYN 7.5  E (GLOVE) ×2
GLOVE SURG SYN 7.5 E (GLOVE) ×2 IMPLANT
GLOVE SURG SYN 7.5 PF PI (GLOVE) ×3 IMPLANT
GOWN STRL REUS W/ TWL LRG LVL3 (GOWN DISPOSABLE) ×5 IMPLANT
GOWN STRL REUS W/TWL LRG LVL3 (GOWN DISPOSABLE) ×2
GRASPER LAPSCPC 5X45 DSP (INSTRUMENTS) IMPLANT
GRASPER SUT TROCAR 14GX15 (MISCELLANEOUS) ×1 IMPLANT
HANDLE YANKAUER SUCT BULB TIP (MISCELLANEOUS) IMPLANT
IRRIGATION STRYKERFLOW (MISCELLANEOUS) IMPLANT
IRRIGATOR STRYKERFLOW (MISCELLANEOUS)
IRRIGATOR SUCT 8 DISP DVNC XI (IRRIGATION / IRRIGATOR) IMPLANT
IRRIGATOR SUCTION 8MM XI DISP (IRRIGATION / IRRIGATOR)
IV NS 1000ML (IV SOLUTION)
IV NS 1000ML BAXH (IV SOLUTION) IMPLANT
KIT PINK PAD W/HEAD ARE REST (MISCELLANEOUS) ×1
KIT PINK PAD W/HEAD ARM REST (MISCELLANEOUS) ×1 IMPLANT
L-HOOK LAP DISP 36CM (ELECTROSURGICAL) ×1
LABEL OR SOLS (LABEL) ×1 IMPLANT
LHOOK LAP DISP 36CM (ELECTROSURGICAL) IMPLANT
MANIFOLD NEPTUNE II (INSTRUMENTS) ×1 IMPLANT
NDL INSUFFLATION 14GA 120MM (NEEDLE) ×1 IMPLANT
NEEDLE HYPO 22GX1.5 SAFETY (NEEDLE) ×1 IMPLANT
NEEDLE INSUFFLATION 14GA 120MM (NEEDLE) IMPLANT
NS IRRIG 500ML POUR BTL (IV SOLUTION) ×1 IMPLANT
OBTURATOR OPTICAL STANDARD 8MM (TROCAR) ×1
OBTURATOR OPTICAL STND 8 DVNC (TROCAR) ×1
OBTURATOR OPTICALSTD 8 DVNC (TROCAR) IMPLANT
PACK COLON CLEAN CLOSURE (MISCELLANEOUS) IMPLANT
PACK LAP CHOLECYSTECTOMY (MISCELLANEOUS) ×1 IMPLANT
RELOAD STAPLE 60 3.5 BLU DVNC (STAPLE) IMPLANT
RELOAD STAPLER 3.5X60 BLU DVNC (STAPLE) IMPLANT
RETRACTOR WOUND ALXS 18CM SML (MISCELLANEOUS) IMPLANT
RTRCTR WOUND ALEXIS O 18CM SML (MISCELLANEOUS)
SEAL CANN UNIV 5-8 DVNC XI (MISCELLANEOUS) ×3 IMPLANT
SEAL XI 5MM-8MM UNIVERSAL (MISCELLANEOUS) ×2
SEALER VESSEL DA VINCI XI (MISCELLANEOUS)
SEALER VESSEL EXT DVNC XI (MISCELLANEOUS) IMPLANT
SOLUTION ELECTROLUBE (MISCELLANEOUS) ×1 IMPLANT
SPIKE FLUID TRANSFER (MISCELLANEOUS) ×1 IMPLANT
SPONGE T-LAP 18X18 ~~LOC~~+RFID (SPONGE) ×3 IMPLANT
SPONGE T-LAP 4X18 ~~LOC~~+RFID (SPONGE) IMPLANT
STAPLER 60 DA VINCI SURE FORM (STAPLE)
STAPLER 60 SUREFORM DVNC (STAPLE) IMPLANT
STAPLER CANNULA SEAL DVNC XI (STAPLE) ×1 IMPLANT
STAPLER CANNULA SEAL XI (STAPLE) ×1
STAPLER RELOAD 3.5X60 BLU DVNC (STAPLE)
STAPLER RELOAD 3.5X60 BLUE (STAPLE)
SUT MNCRL 4-0 (SUTURE) ×2
SUT MNCRL 4-0 27XMFL (SUTURE) ×2
SUT MNCRL AB 4-0 PS2 18 (SUTURE) ×1 IMPLANT
SUT PDS AB 1 CT1 36 (SUTURE) IMPLANT
SUT PROLENE 2 0 SH DA (SUTURE) IMPLANT
SUT SILK 3 0 SH 30 (SUTURE) IMPLANT
SUT V-LOC 90 ABS 3-0 VLT  V-20 (SUTURE)
SUT V-LOC 90 ABS 3-0 VLT V-20 (SUTURE) IMPLANT
SUT VIC AB 3-0 SH 27 (SUTURE) ×1
SUT VIC AB 3-0 SH 27X BRD (SUTURE) ×1 IMPLANT
SUT VICRYL 0 UR6 27IN ABS (SUTURE) ×1 IMPLANT
SUT VLOC 90 S/L VL9 GS22 (SUTURE) IMPLANT
SUTURE MNCRL 4-0 27XMF (SUTURE) ×1 IMPLANT
TRAP FLUID SMOKE EVACUATOR (MISCELLANEOUS) ×1 IMPLANT
TRAY FOLEY MTR SLVR 16FR STAT (SET/KITS/TRAYS/PACK) ×1 IMPLANT
TRAY FOLEY SLVR 16FR LF STAT (SET/KITS/TRAYS/PACK) ×1 IMPLANT
TROCAR XCEL NON-BLD 5MMX100MML (ENDOMECHANICALS) IMPLANT
TUBING EVAC SMOKE HEATED PNEUM (TUBING) ×1 IMPLANT
WATER STERILE IRR 500ML POUR (IV SOLUTION) ×1 IMPLANT

## 2023-02-03 NOTE — Anesthesia Procedure Notes (Signed)
Procedure Name: Intubation Date/Time: 02/03/2023 1:57 PM  Performed by: Kelton Pillar, CRNAPre-anesthesia Checklist: Patient identified, Emergency Drugs available, Suction available and Patient being monitored Patient Re-evaluated:Patient Re-evaluated prior to induction Oxygen Delivery Method: Circle system utilized Preoxygenation: Pre-oxygenation with 100% oxygen Induction Type: IV induction Ventilation: Mask ventilation without difficulty Laryngoscope Size: McGraph and 3 Grade View: Grade I Tube type: Oral Tube size: 7.0 mm Number of attempts: 1 Airway Equipment and Method: Stylet and Oral airway Placement Confirmation: ETT inserted through vocal cords under direct vision, positive ETCO2, breath sounds checked- equal and bilateral and CO2 detector Secured at: 21 cm Tube secured with: Tape Dental Injury: Teeth and Oropharynx as per pre-operative assessment

## 2023-02-03 NOTE — Interval H&P Note (Signed)
History and Physical Interval Note:  02/03/2023 12:46 PM  [name] [name]  has presented today for surgery, with the diagnosis of right groin pain.  The various methods of treatment have been discussed with the patient and family. After consideration of risks, benefits and other options for treatment, the patient has consented to  Procedure(s): XI ROBOT ASSISTED DIAGNOSTIC LAPAROSCOPY, possible right inguinal hernia repair (Right) as a surgical intervention.  The patient's history has been reviewed, patient examined, no change in status, stable for surgery.  I have reviewed the patient's chart and labs.  Questions were answered to the patient's satisfaction.     [name] [name]

## 2023-02-03 NOTE — Transfer of Care (Signed)
Immediate Anesthesia Transfer of Care Note  Patient: [name] [name]  Procedure(s) Performed: DIAGNOSTIC LAPAROSCOPY (Right: Abdomen)  Patient Location: PACU  Anesthesia Type:General  Level of Consciousness: awake, drowsy, and patient cooperative  Airway & Oxygen Therapy: Patient Spontanous Breathing and Patient connected to face mask oxygen  Post-op Assessment: Report given to RN and Post -op Vital signs reviewed and stable  Post vital signs: Reviewed and stable  Last Vitals:  Vitals Value Taken Time  BP 132/81 02/03/23 1508  Temp    Pulse 62 02/03/23 1511  Resp 16 02/03/23 1511  SpO2 100 % 02/03/23 1511  Vitals shown include unvalidated device data.  Last Pain:  Vitals:   02/03/23 1145  TempSrc: Temporal  PainSc: 7          Complications: No notable events documented.

## 2023-02-03 NOTE — Anesthesia Preprocedure Evaluation (Signed)
Anesthesia Evaluation  Patient identified by MRN, date of birth, ID band Patient awake  General Assessment Comment:  Patient with recent history of weight loss, shortness of breath, and muscle loss. No prior history of muscular dystrophies or neuromuscular disease, nor any family history. Patient used to walk all the time, played sports before. Has received general anesthetics before without issue. Very low concern for anything which could be triggered by volatiles or succinylcholine.  Reviewed: Allergy & Precautions, NPO status , Patient's Chart, lab work & pertinent test results  History of Anesthesia Complications Negative for: history of anesthetic complications  Airway Mallampati: II  TM Distance: >3 FB Neck ROM: Full    Dental no notable dental hx. (+) Teeth Intact, Implants, Dental Advidsory Given   Pulmonary shortness of breath and with exertion, sleep apnea and Continuous Positive Airway Pressure Ventilation , neg COPD, Patient abstained from smoking.Not current smoker, former smoker   Pulmonary exam normal breath sounds clear to auscultation       Cardiovascular Exercise Tolerance: Good METShypertension, + CAD  (-) Past MI (-) dysrhythmias  Rhythm:Regular Rate:Normal - Systolic murmurs 1. Left ventricular ejection fraction, by estimation, is 60 to 65%. The  left ventricle has normal function. The left ventricle has no regional  wall motion abnormalities. There is mild left ventricular hypertrophy.  Left ventricular diastolic parameters  are consistent with Grade I diastolic dysfunction (impaired relaxation).  The average left ventricular global longitudinal strain is -17.4 %. The  global longitudinal strain is normal.  2. Right ventricular systolic function is normal. The right ventricular  size is normal. Tricuspid regurgitation signal is inadequate for assessing  PA pressure.    Neuro/Psych  Headaches, neg Seizures   Neuromuscular disease  negative psych ROS   GI/Hepatic ,GERD  Medicated and Controlled,,(+)     (-) substance abuse    Endo/Other  diabetes    Renal/GU negative Renal ROS     Musculoskeletal  (+) Arthritis ,    Abdominal   Peds  Hematology   Anesthesia Other Findings Patient with PMH of mitochondrial disease. Patient was seen at [facility] and sent with information on his disease. Patient stated has never had problems with his condition and anesthesia before. Information provided suggested pre op EKG, electrolytes and D5 1/2 normal saline. All has been ordered. Discussed with the patient the risks of anesthesia with his condition and he stated he understood and agreed.   Past Medical History: No date: Allergy No date: Angina, class III (HCC) No date: Chronic constipation No date: Degeneration of intervertebral disc of cervical region No date: Diabetes mellitus without complication (HCC) No date: Diastolic dysfunction     Comment:  a. 06/2017 Echo: EF 60-65%, no rwma, Gr1 DD. No date: Dyspnea No date: GERD (gastroesophageal reflux disease) 1991: Hernia     Comment:  02/10/2012-RIH repair No date: Hyperlipidemia 2008: Hypertension No date: Nerve root pain No date: Neuropathy No date: Non-obstructive CAD (coronary artery disease)     Comment:  a. 05/30/2015 cath: LM nl, mLAD 50% (FFR 0.83), LCx minor               irregs, RCA minor irregs, EF 55-65%-->Med Rx; b. 03/2016               Cath: LM nl LAD 20m D1/2/3 min irregs, LCX min irregs,               OM1/2/3 nl, RCA min irregs, RPDA/RPAV/RPL1/RPL2 nl-->Med  Rx; b. 07/2020 Cath: LM nl, LAD 40m D1/2/3 min irregs,               LCX min irregs, RCA min irregs, RPDA/RPAV/RPL1,2 nl, EF               55-65%. 2012: Obesity, unspecified 2012: Personal history of tobacco use, presenting hazards to health No date: Polycythemia 02/11/2017: Rectal bleeding 02/09/2011, 04/07/2013: Recurrent Right Inguinal Hernia  Repair     Comment:  [facility]al Collin [facility]: Shortness of breath No date: Sleep apnea     Comment:  a. On CPAP;  b. 06/2017 Echo: no PAH. 088/10/314 Umbilical hernia without mention of obstruction or  gangrene     Comment:  [facility]al [facility] date: Vitamin D deficiency  Reproductive/Obstetrics                             Anesthesia Physical Anesthesia Plan  ASA: 3  Anesthesia Plan: General   Post-op Pain Management:    Induction: Intravenous  PONV Risk Score and Plan: 3 and Ondansetron, Midazolam and Dexamethasone  Airway Management Planned: Oral ETT  Additional Equipment: None  Intra-op Plan:   Post-operative Plan: Extubation in OR  Informed Consent: I have reviewed the patients History and Physical, chart, labs and discussed the procedure including the risks, benefits and alternatives for the proposed anesthesia with the patient or authorized representative who has indicated his/her understanding and acceptance.     Dental advisory given  Plan Discussed with: CRNA and Surgeon  Anesthesia Plan Comments: (Patient consented for risks of anesthesia including but not limited to:  - adverse reactions to medications - damage to eyes, teeth, lips or other oral mucosa - nerve damage due to positioning  - sore throat or hoarseness - Damage to heart, brain, nerves, lungs, other parts of body or loss of life  Patient voiced understanding.)        Anesthesia Quick Evaluation

## 2023-02-03 NOTE — Op Note (Signed)
  Procedure Date:  02/03/2023  Pre-operative Diagnosis:  Right groin pain, possible recurrent right inguinal hernia  Post-operative Diagnosis: Right groin pain.  Procedure: 1.  Robotic assisted diagnostic laparoscopy; laparoscopic lysis of adhesions.  Surgeon:  Melvyn Neth, MD  Anesthesia:  General endotracheal  Estimated Blood Loss:  5 ml  Specimens:  None  Complications:  None  Indications for Procedure:  This is a 62 y.o. male who presents with a history of prior open right inguinal hernia repair x 2 in 2012 and 2014.  He has been having persistent pain in the right groin, and on exam, there was a potential recurrence, though very small if any.  The options of surgery versus observation were reviewed with the patient and/or family. The risks of bleeding, abscess or infection, recurrence of symptoms, potential for an open procedure, injury to surrounding structures, and chronic pain were all discussed with the patient and he was willing to proceed.  Description of Procedure: The patient was correctly identified in the preoperative area and brought into the operating room.  The patient was placed supine with VTE prophylaxis in place.  Appropriate time-outs were performed.  Anesthesia was induced and the patient was intubated.  Foley catheter was placed.  Appropriate antibiotics were infused.  The abdomen was prepped and draped in a sterile fashion. A supraumbilical incision was made. A cutdown technique was used to enter the abdominal cavity without injury, and a Hasson trocar was inserted.  Pneumoperitoneum was obtained with appropriate opening pressures.  Two 8-mm robotic ports were placed in the right and left lateral positions under direct visualization.  On initial inspection, there was no evidence of recurrent hernia on the right groin or new hernia on the left groin.  There was evidence of prior surgery with metal clips visible anterior to the peritoneal layer.  The medial  umbilical fold on the right side was adhered to the medial aspect of the hernia repair, and there was also an adhesion from the mesoappendix to the medial aspect of the hernia repair.  Cautery was used to free up the appendix and to free up the medial umbilical fold.  After doing this, the medial aspect of his hernia repair site was better evaluated and again there was no evidence of a hernia.  Pictures were taken.  The 8- mm ports were removed under direct visualization and the Hasson trocar was removed.  The fascial opening was closed using 0 vicryl suture.  Local anesthetic was infused in all incisions as well as a bilateral ilioinguinal block, and the incisions were closed with 4-0 Monocryl.  The wounds were cleaned and sealed with DermaBond.  Foley catheter was removed and the patient was emerged from anesthesia and extubated and brought to the recovery room for further management.  The patient tolerated the procedure well and all counts were correct at the end of the case.   Melvyn Neth, MD

## 2023-02-03 NOTE — Discharge Instructions (Signed)

## 2023-02-04 ENCOUNTER — Encounter: Payer: Self-pay | Admitting: Surgery

## 2023-02-04 NOTE — Anesthesia Postprocedure Evaluation (Signed)
Anesthesia Post Note  Patient: [name] [name]  Procedure(s) Performed: DIAGNOSTIC LAPAROSCOPY (Right: Abdomen)  Patient location during evaluation: PACU Anesthesia Type: General Level of consciousness: awake and alert Pain management: pain level controlled Vital Signs Assessment: post-procedure vital signs reviewed and stable Respiratory status: spontaneous breathing, nonlabored ventilation, respiratory function stable and patient connected to nasal cannula oxygen Cardiovascular status: blood pressure returned to baseline and stable Postop Assessment: no apparent nausea or vomiting Anesthetic complications: no   No notable events documented.   Last Vitals:  Vitals:   02/03/23 1545 02/03/23 1607  BP: 128/80 (!) 140/89  Pulse: (!) 55 (!) 52  Resp: 16 17  Temp: (!) 36.1 C (!) 36.2 C  SpO2: 99% 98%    Last Pain:  Vitals:   02/03/23 1607  TempSrc: Temporal  PainSc: [place]

## 2023-02-05 NOTE — Progress Notes (Unsigned)
Name: [name] [name]   MRN: [id number]    DOB: 09-01-1961   Date:02/05/2023       Progress Note  Subjective  Chief Complaint  Follow Up  HPI    DMII: he is taking [place] and Trulicity, [id number] has been to goal but he continues to have bloating, we will stop Trulicity again. He denies polyphagia, polydipsia or polyuria  Mitochondrial disease   going to [facility] and seeing neuromuscular sub-specialist , he is still using a cane, does not like using crutches . He has neuromuscular atrophy.  He also has dysphagia He has seen a geneticist    A. 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 See comment. - Mild, chronic neurogenic atrophy.    Constipation:he has been taking dulcolax and milk of magnesia prn, he states Amitiza did not work , discussed miralax daily    HTN/Angina : He is taking carvedilol and Ranexa, off Imdur due to hypotension. Denies recent chest pain or palpitation lately , he is under the care of cardiologist   Cardiac cath done 07/2021     Ost LAD to Mid LAD lesion is 10% stenosed.   Mid LAD lesion is 30% stenosed     SOB: going on since Fall 2021 - he has been evaluated by pulmonologist and cardiologist and no answers yet, cardiac cath unremarkable, also negative  CT chest. He was given Memory Dance initially without help, he was told secondary to mitochondrial disease    Hyperlipidemia: compliant with medication, LDL at goal it was 68 , off statin and zetia due to mitochondrial disease  Seizure like activity  : still under the care of Dr. Melrose Nakayama, still has tremors when standing up, had an episode of shaking at night and went back to  main [facility], he had a recently had an MRI and will have another EEG at [facility] He is of lyrica and likely secondary to mitochondrial disease    Low B12: he is getting B12 injections  monthly    OSA: he is wearing CPAP every night,  he states  compliant with machine  but not sleeping well due to muscle cramping at night He has Temazepam     Cervical left radiculopathy: seen by Dr. Jobe Igo and PT advised since his SOB and recent health issues makes him a poor candidate for surgery at this time.He saw Dr. Markus Jarvis and was referred to pain medicine - Dr. Holley Raring    Imaging: MRI C spine 08/26/2021 IMPRESSION:  1. Multilevel spondylosis of the cervical spine as described.  2. Moderate foraminal narrowing bilaterally at C2-3, right greater  than left.  3. Moderate right mild left foraminal narrowing at C3-4.  4. Moderate foraminal narrowing bilaterally at C5-6 and C6-7 is  worse on the right.  5. Mild central canal narrowing at C6-7.    Malnutrition: BMI is now down to 30, he has lost about 50 lbs, his baseline weight was 262 lbs, he is trying to increase calorie intake and eating 5 times a day ,weight went up since last visit    Enlarged thyroid: CT done 03/2021 at [facility], showed goiter and compression of trachea , TSH has been at goal . Unchanged   History of gastritis: seen by Dr. Marius Ditch, advised to continue omeprazole once a day, only taking sucralfate prn, explained bloating likely from Trulicity and we will try stop taking  History of right inguinal hernia repair years ago,  over the past few weeks he noticed a constant pain on right lower quadrant , tender to touch, worse when he bears down, radiates to right testicle intermittently, no fever . Denies nausea or vomiting.   Mitochondrial disease: had recent labs done and told low vitamin D, we will give him a rx to take weekly and to not take sucralfate at the same time. Still using a cane, gained some weight.   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[place]) 01/22/2022   Tremor 01/22/2022   Shortness of breath 01/22/2022   Muscle atrophy of lower extremity 11/10/2021   Dysphagia    Asthma 05/14/2021   Multiple thyroid nodules 05/14/2021   Localized, primary osteoarthritis of hand 09/28/2019   Seizure-like activity ([place]) 02/22/2018   Mild cognitive impairment 11/17/2017   Radiculopathy of cervical region 11/17/2017   Stenosis of carotid artery 09/08/2017   Cervical stenosis of spine 09/08/2017   B12 deficiency 06/28/2017   Headache disorder 02/15/2017   Memory loss or impairment 02/15/2017   Coronary artery disease involving native coronary artery of native heart    Radiculitis of left cervical region 03/05/2016   Diabetic neuropathy associated with type 2 diabetes mellitus ([place]) 12/05/2015   Patellar subluxation 06/05/2015   Allergic rhinitis, seasonal 06/05/2015   Chronic constipation 06/05/2015   Chronic lower back pain 06/05/2015   Vitamin D deficiency 06/05/2015   Central sleep apnea 06/05/2015   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   Nerve root pain 06/05/2015   Acquired polycythemia 06/05/2015   Anterior knee pain 06/05/2015   Failure of erection 06/05/2015   Gastro-esophageal reflux disease without esophagitis 06/05/2015   Neuropathy 06/05/2015   Angina, class III ([place]) 05/23/2015   Essential hypertension 05/23/2015   Hyperlipidemia 05/23/2015   Inguinal hernia without mention of obstruction or gangrene, recurrent unilateral or unspecified 03/24/2013    Past Surgical History:  Procedure Laterality Date   BACK SURGERY  12/2008   X2-LUMBAR   CARDIAC CATHETERIZATION N/A 05/30/2015   Procedure: Left Heart Cath;  Surgeon: Wellington Hampshire, MD;  Location: [place] [facility];  Service: Cardiovascular;  Laterality: N/A;   CARDIAC CATHETERIZATION N/A 04/16/2016   Procedure: Left Heart Cath and Coronary Angiography;  Surgeon: Wellington Hampshire, MD;  Location:  [place] [facility];  Service: Cardiovascular;  Laterality: N/A;   COLONOSCOPY  2014   Dr. [name]   COLONOSCOPY WITH PROPOFOL N/A 12/25/2016   Procedure: COLONOSCOPY WITH PROPOFOL;  Surgeon: Jonathon Bellows, MD;  Location: [facility] ENDOSCOPY;  Service: Endoscopy;  Laterality: N/A;   COLONOSCOPY WITH PROPOFOL N/A 01/29/2017   Procedure: COLONOSCOPY WITH PROPOFOL;  Surgeon: Jonathon Bellows, MD;  Location: [facility] ENDOSCOPY;  Service: Endoscopy;  Laterality: N/A;   COLONOSCOPY WITH PROPOFOL N/A 04/11/2020   Procedure: COLONOSCOPY WITH PROPOFOL;  Surgeon: Jonathon Bellows, MD;  Location: [facility] ENDOSCOPY;  Service: Gastroenterology;  Laterality: N/A;   ESOPHAGEAL MANOMETRY N/A 03/04/2022   Procedure: ESOPHAGEAL MANOMETRY (EM);  Surgeon: Mauri Pole, MD;  Location: [facility] ENDOSCOPY;  Service: Gastroenterology;  Laterality: N/A;   ESOPHAGOGASTRODUODENOSCOPY (EGD) WITH PROPOFOL N/A 01/26/2019   Procedure: ESOPHAGOGASTRODUODENOSCOPY (EGD) WITH PROPOFOL;  Surgeon: Lin Landsman, MD;  Location: [facility] ENDOSCOPY;  Service: Gastroenterology;  Laterality: N/A;   ESOPHAGOGASTRODUODENOSCOPY (EGD)  WITH PROPOFOL N/A 09/10/2021   Procedure: ESOPHAGOGASTRODUODENOSCOPY (EGD) WITH PROPOFOL;  Surgeon: Lin Landsman, MD;  Location: [facility] ENDOSCOPY;  Service: Gastroenterology;  Laterality: N/A;   EVALUATION UNDER ANESTHESIA WITH HEMORRHOIDECTOMY N/A 02/24/2017   Procedure: EXAM UNDER ANESTHESIA WITH POSSIBLE EXCISION OF INTERNAL HEMORRHOIDS;  Surgeon: Olean Ree, MD;  Location: [place] [facility];  Service: General;  Laterality: N/A;   FISSURECTOMY  02/24/2017   Procedure: FISSURECTOMY;  Surgeon: Olean Ree, MD;  Location: [facility];  Service: General;;   HAND SURGERY Left 2020   HERNIA REPAIR Right 1991   INGUINAL HERNIA REPAIR Right 2012   Dr Teryl Lucy HERNIA REPAIR Right 2014   Dr [name]   INTRAVASCULAR PRESSURE WIRE/FFR STUDY N/A 07/30/2021   Procedure: INTRAVASCULAR PRESSURE WIRE/FFR STUDY;  Surgeon: Wellington Hampshire, MD;  Location: [place] [facility];  Service: Cardiovascular;  Laterality: N/A;   LEFT HEART CATH AND CORONARY ANGIOGRAPHY Left 08/05/2020   Procedure: LEFT HEART CATH AND CORONARY ANGIOGRAPHY poss PCI;  Surgeon: Wellington Hampshire, MD;  Location: [place] [facility];  Service: Cardiovascular;  Laterality: Left;   MUSCLE BIOPSY Left 12/31/2021   Procedure: LEFT VASTUS LATERALIS BIOPSY;  Surgeon: Meade Maw, MD;  Location: [facility];  Service: Neurosurgery;  Laterality: Left;   RIGHT/LEFT HEART CATH AND CORONARY ANGIOGRAPHY N/A 07/30/2021   Procedure: RIGHT/LEFT HEART CATH AND CORONARY ANGIOGRAPHY;  Surgeon: Wellington Hampshire, MD;  Location: [place] [facility];  Service: Cardiovascular;  Laterality: N/A;   WRIST SURGERY Left    XI ROBOT ASSISTED DIAGNOSTIC LAPAROSCOPY Right 02/03/2023   Procedure: DIAGNOSTIC LAPAROSCOPY;  Surgeon: Olean Ree, MD;  Location: [facility];  Service: General;  Laterality: Right;    Family History  Problem Relation Age of Onset   Hypertension Mother    Heart Problems Mother    High Cholesterol Mother    Heart Problems Father 55       myocardial infarction   Mental illness Brother    Other Maternal Aunt        COVID   Lung cancer Maternal Aunt    Lung cancer Maternal Aunt    Cancer Maternal Uncle        unknown   Prostate cancer Maternal Uncle    Testicular cancer Paternal Uncle    Thyroid disease Daughter    Breast cancer Cousin     Social History   Tobacco Use   Smoking status: Former    Types: Cigars    Start date: 12/28/2002    Quit date: 06/04/2005    Years since quitting: 17.6   Smokeless tobacco: Never   Tobacco comments:    quit 2006-smoked 1 cigar occ  Substance Use Topics   Alcohol use: Not Currently    Comment: has not had a drink in 8 months 02/2021     Current Outpatient Medications:    ACCU-CHEK GUIDE test strip, USE TO TEST BLOOD SUGAR THREE TIMES DAILY AS NEEDED, Disp: 100 strip, Rfl: 1   Accu-Chek Softclix Lancets  lancets, TEST TWICE DAILY, Disp: 100 each, Rfl: 3   acetaminophen (TYLENOL) 500 MG tablet, Take 2 tablets (1,000 mg total) by mouth every 6 (six) hours as needed for mild pain., Disp: , Rfl:    Alpha Lipoic Acid 200 MG CAPS, Take 200 mg by mouth daily., Disp: , Rfl:    blood glucose meter kit and supplies, 1 each by Other route in the morning and at bedtime. Dispense based on patient and insurance preference. Use  up to two  times daily. (FOR ICD-10 E10.9, E11.9)., Disp: 1 each, Rfl: 0   carvedilol (COREG) 6.25 MG tablet, TAKE 1 TABLET(6.25 MG) BY MOUTH TWICE DAILY (Patient taking differently: Take 6.25 mg by mouth 2 (two) times daily with a meal.), Disp: 180 tablet, Rfl: 1   Coenzyme Q10 (COQ-10) 100 MG CAPS, Take 100 mg by mouth daily., Disp: , Rfl:    Dulaglutide (TRULICITY) A999333 0000000 SOPN, Inject 0.75 mg into the skin See admin instructions. Every other week-takes on MONDAYS, Disp: , Rfl:    ezetimibe (ZETIA) 10 MG tablet, Take 1 tablet (10 mg total) by mouth daily. (Patient taking differently: Take 10 mg by mouth every morning.), Disp: 90 tablet, Rfl: 3   fluticasone (FLONASE) 50 MCG/ACT nasal spray, Place 1 spray into both nostrils daily as needed for allergies or rhinitis., Disp: , Rfl:    fluticasone furoate-vilanterol (BREO ELLIPTA) 100-25 MCG/ACT AEPB, Inhale 1 puff into the lungs daily. (Patient taking differently: Inhale 1 puff into the lungs every morning.), Disp: 60 each, Rfl: 6   ibuprofen (ADVIL) 600 MG tablet, Take 1 tablet (600 mg total) by mouth every 8 (eight) hours as needed for moderate pain., Disp: 60 tablet, Rfl: 0   ketoconazole (NIZORAL) 2 % shampoo, Apply 1 Application topically 3 (three) times a week. Wash scalp/body 3 times a week as needed for alres, let sit 5 minutes before rinsing off, avoid eyes. May use once monthly for maintenance. (Patient taking differently: Apply 1 Application topically 2 (two) times a week. Wash scalp/body 3 times a week as needed for alres, let  sit 5 minutes before rinsing off, avoid eyes. May use once monthly for maintenance.), Disp: 120 mL, Rfl: 11   levOCARNitine (CARNITOR) 330 MG tablet, Take 330 mg by mouth 2 (two) times daily., Disp: , Rfl:    losartan-hydrochlorothiazide (HYZAAR) 50-12.5 MG tablet, TAKE 1 TABLET BY MOUTH DAILY (Patient taking differently: Take 1 tablet by mouth every morning.), Disp: 90 tablet, Rfl: 2   mupirocin ointment (BACTROBAN) 2 %, Apply 1 application topically daily. Qd to excision site (Patient taking differently: Apply 1 application  topically daily as needed (skin irritation.). Qd to excision site), Disp: 22 g, Rfl: 1   nitroGLYCERIN (NITROSTAT) 0.4 MG SL tablet, Place 1 tablet (0.4 mg total) under the tongue every 5 (five) minutes as needed for chest pain., Disp: , Rfl:    omeprazole (PRILOSEC) 40 MG capsule, TAKE 1 CAPSULE(40 MG) BY MOUTH TWICE DAILY BEFORE A MEAL (Patient taking differently: 40 mg 2 (two) times daily.), Disp: 60 capsule, Rfl: 5   oxyCODONE (OXY IR/ROXICODONE) 5 MG immediate release tablet, Take 1 tablet (5 mg total) by mouth every 6 (six) hours as needed for severe pain., Disp: 20 tablet, Rfl: 0   pregabalin (LYRICA) 50 MG capsule, Take 50 mg by mouth every morning., Disp: , Rfl:    ranolazine (RANEXA) 1000 MG SR tablet, TAKE 1 TABLET(1000 MG) BY MOUTH TWICE DAILY (Patient taking differently: 1,000 mg 2 (two) times daily.), Disp: 180 tablet, Rfl: 0   Riboflavin (B-2) 100 MG TABS, Take 100 mg by mouth daily., Disp: , Rfl:    rosuvastatin (CRESTOR) 10 MG tablet, Take 1 tablet (10 mg total) by mouth daily., Disp: 90 tablet, Rfl: 3   sildenafil (VIAGRA) 100 MG tablet, Take 1 tablet (100 mg total) by mouth daily as needed for erectile dysfunction., Disp: 30 tablet, Rfl: 1   temazepam (RESTORIL) 15 MG capsule, TAKE 1 CAPSULE(15 MG) BY MOUTH AT  BEDTIME AS NEEDED FOR SLEEP (Patient taking differently: Take 15 mg by mouth at bedtime.), Disp: 90 capsule, Rfl: 0   Vitamin D, Ergocalciferol, (DRISDOL)  1.25 MG (50000 UNIT) CAPS capsule, Take 1 capsule (50,000 Units total) by mouth every 7 (seven) days. (Patient taking differently: Take 50,000 Units by mouth every 7 (seven) days. mondays), Disp: 12 capsule, Rfl: 1  Allergies  Allergen Reactions   Gabapentin Other (See Comments)    Groggy-Mood Changes   Latex Rash   Penicillins Hives and Rash    Tolerated 1st generation cephalosporin (CEFAZOLIN) on 12/31/2021 with no documented ADRs.  PCN reaction causing immediate rash, facial/tongue/throat swelling, SOB or lightheadedness with hypotension: Yes PCN reaction causing severe rash involving mucus membranes or skin necrosis: No PCN reaction that required hospitalization No PCN reaction occurring within the last 10 years: No If all of the above answers are "NO", then may proceed with Cephalosporin use.     I personally reviewed active problem list, medication list, allergies, family history, social history, health maintenance with the patient/caregiver today.   ROS  ***  Objective  There were no vitals filed for this visit.  There is no height or weight on file to calculate BMI.  Physical Exam ***  Recent Results (from the past 2160 hour(s))  Basic metabolic panel     Status: Abnormal   Collection Time: 01/12/23  9:47 AM  Result Value Ref Range   Sodium 137 135 - 145 mmol/L   Potassium 3.5 3.5 - 5.1 mmol/L   Chloride 103 98 - 111 mmol/L   CO2 27 22 - 32 mmol/L   Glucose, Bld 128 (H) 70 - 99 mg/dL    Comment: Glucose reference range applies only to samples taken after fasting for at least 8 hours.   BUN 14 6 - 20 mg/dL   Creatinine, Ser 0.95 0.61 - 1.24 mg/dL   Calcium 8.6 (L) 8.9 - 10.3 mg/dL   GFR, Estimated >60 >60 mL/min    Comment: (NOTE) Calculated using the CKD-EPI Creatinine Equation (2021)    Anion gap 7 5 - 15    Comment: Performed at [facility], [address] [place], [place] [address]  ECHOCARDIOGRAM COMPLETE     Status: None   Collection  Time: 01/19/23  9:06 AM  Result Value Ref Range   AR max vel 2.58 cm2   AV Peak grad 5.2 mmHg   Ao pk vel 1.14 m/s   S' Lateral 3.60 cm   Area-P 1/2 2.64 cm2   AV Area VTI 2.17 cm2   AV Mean grad 3.0 mmHg   Single Plane A4C EF 53.2 %   Single Plane A2C EF 52.2 %   Calc EF 52.8 %   AV Area mean vel 2.49 cm2   Est EF 55 - 60%   Glucose, capillary     Status: None   Collection Time: 02/03/23 11:35 AM  Result Value Ref Range   Glucose-Capillary 97 70 - 99 mg/dL    Comment: Glucose reference range applies only to samples taken after fasting for at least 8 hours.  Basic metabolic panel     Status: Abnormal   Collection Time: 02/03/23  1:21 PM  Result Value Ref Range   Sodium 137 135 - 145 mmol/L   Potassium 3.1 (L) 3.5 - 5.1 mmol/L   Chloride 101 98 - 111 mmol/L   CO2 26 22 - 32 mmol/L   Glucose, Bld 93 70 - 99 mg/dL    Comment:  Glucose reference range applies only to samples taken after fasting for at least 8 hours.   BUN 14 6 - 20 mg/dL   Creatinine, Ser 0.98 0.61 - 1.24 mg/dL   Calcium 8.5 (L) 8.9 - 10.3 mg/dL   GFR, Estimated >60 >60 mL/min    Comment: (NOTE) Calculated using the CKD-EPI Creatinine Equation (2021)    Anion gap 10 5 - 15    Comment: Performed at [facility], [address] [place], [place] [id number]  Glucose, capillary     Status: Abnormal   Collection Time: 02/03/23  3:16 PM  Result Value Ref Range   Glucose-Capillary 140 (H) 70 - 99 mg/dL    Comment: Glucose reference range applies only to samples taken after fasting for at least 8 hours.    Diabetic Foot Exam: Diabetic Foot Exam - Simple   No data filed    ***  PHQ2/9:    12/31/2022   10:06 AM 11/04/2022   10:20 AM 07/29/2022    8:36 AM 05/08/2022    8:29 AM 02/26/2022   10:46 AM  Depression screen PHQ 2/9  Decreased Interest 0 0 0 0 0  Down, Depressed, Hopeless 0 0 0 0 0  PHQ - 2 Score 0 0 0 0 0  Altered sleeping 0 0  0 0  Tired, decreased energy 0 0  0 0  Change in appetite 0 0   0 0  Feeling bad or failure about yourself  0 0  0 0  Trouble concentrating 0 0  0 0  Moving slowly or fidgety/restless 0 0  0 0  Suicidal thoughts 0 0  0 0  PHQ-9 Score 0 0  0 0    phq 9 is {gen pos [id number]   Fall Risk:    12/31/2022   10:05 AM 11/04/2022   10:20 AM 07/29/2022    8:36 AM 05/08/2022    8:29 AM 02/26/2022   10:46 AM  Fall Risk   Falls in the past year? 1 1 0 0 0  Number falls in past yr: 0 1 0 0 0  Injury with Fall? 0 0 0 0 0  Risk for fall due to : Impaired balance/gait Impaired balance/gait No Fall Risks No Fall Risks No Fall Risks  Follow up Falls prevention discussed Falls prevention discussed Falls prevention discussed Falls prevention discussed Falls prevention discussed      Functional Status Survey:      Assessment & Plan  *** There are no diagnoses linked to this encounter.

## 2023-02-08 ENCOUNTER — Ambulatory Visit: Payer: Medicaid Other | Admitting: Family Medicine

## 2023-02-08 ENCOUNTER — Encounter: Payer: Self-pay | Admitting: Family Medicine

## 2023-02-08 VITALS — BP 118/70 | HR 72 | Resp 16 | Ht 71.0 in | Wt 232.0 lb

## 2023-02-08 DIAGNOSIS — E1129 Type 2 diabetes mellitus with other diabetic kidney complication: Secondary | ICD-10-CM

## 2023-02-08 DIAGNOSIS — F5101 Primary insomnia: Secondary | ICD-10-CM | POA: Diagnosis not present

## 2023-02-08 DIAGNOSIS — I10 Essential (primary) hypertension: Secondary | ICD-10-CM

## 2023-02-08 DIAGNOSIS — I209 Angina pectoris, unspecified: Secondary | ICD-10-CM | POA: Diagnosis not present

## 2023-02-08 DIAGNOSIS — E538 Deficiency of other specified B group vitamins: Secondary | ICD-10-CM | POA: Diagnosis not present

## 2023-02-08 DIAGNOSIS — E559 Vitamin D deficiency, unspecified: Secondary | ICD-10-CM

## 2023-02-08 DIAGNOSIS — R569 Unspecified convulsions: Secondary | ICD-10-CM | POA: Diagnosis not present

## 2023-02-08 DIAGNOSIS — I152 Hypertension secondary to endocrine disorders: Secondary | ICD-10-CM

## 2023-02-08 DIAGNOSIS — K219 Gastro-esophageal reflux disease without esophagitis: Secondary | ICD-10-CM | POA: Diagnosis not present

## 2023-02-08 DIAGNOSIS — E8840 Mitochondrial metabolism disorder, unspecified: Secondary | ICD-10-CM

## 2023-02-08 DIAGNOSIS — E1159 Type 2 diabetes mellitus with other circulatory complications: Secondary | ICD-10-CM

## 2023-02-08 DIAGNOSIS — E1169 Type 2 diabetes mellitus with other specified complication: Secondary | ICD-10-CM

## 2023-02-08 DIAGNOSIS — R809 Proteinuria, unspecified: Secondary | ICD-10-CM

## 2023-02-08 DIAGNOSIS — E440 Moderate protein-calorie malnutrition: Secondary | ICD-10-CM

## 2023-02-08 DIAGNOSIS — E785 Hyperlipidemia, unspecified: Secondary | ICD-10-CM

## 2023-02-08 LAB — POCT GLYCOSYLATED HEMOGLOBIN (HGB A1C): Hemoglobin A1C: 5.7 % — AB (ref 4.0–5.6)

## 2023-02-08 MED ORDER — TEMAZEPAM 15 MG PO CAPS: 15.0000 mg | ORAL_CAPSULE | Freq: Every day | ORAL | 0 refills | Status: DC

## 2023-02-12 DIAGNOSIS — M4802 Spinal stenosis, cervical region: Secondary | ICD-10-CM | POA: Diagnosis not present

## 2023-02-12 DIAGNOSIS — G5603 Carpal tunnel syndrome, bilateral upper limbs: Secondary | ICD-10-CM | POA: Diagnosis not present

## 2023-02-12 DIAGNOSIS — M4722 Other spondylosis with radiculopathy, cervical region: Secondary | ICD-10-CM | POA: Diagnosis not present

## 2023-02-12 DIAGNOSIS — F17290 Nicotine dependence, other tobacco product, uncomplicated: Secondary | ICD-10-CM | POA: Diagnosis not present

## 2023-02-12 DIAGNOSIS — G713 Mitochondrial myopathy, not elsewhere classified: Secondary | ICD-10-CM | POA: Diagnosis not present

## 2023-02-12 DIAGNOSIS — R0602 Shortness of breath: Secondary | ICD-10-CM | POA: Diagnosis not present

## 2023-02-12 DIAGNOSIS — M50121 Cervical disc disorder at C4-C5 level with radiculopathy: Secondary | ICD-10-CM | POA: Diagnosis not present

## 2023-02-12 DIAGNOSIS — G4733 Obstructive sleep apnea (adult) (pediatric): Secondary | ICD-10-CM | POA: Diagnosis not present

## 2023-02-13 ENCOUNTER — Other Ambulatory Visit: Payer: Self-pay | Admitting: Family Medicine

## 2023-02-13 DIAGNOSIS — F5101 Primary insomnia: Secondary | ICD-10-CM

## 2023-02-16 ENCOUNTER — Other Ambulatory Visit: Payer: Self-pay | Admitting: Family Medicine

## 2023-02-17 ENCOUNTER — Encounter: Payer: Self-pay | Admitting: Surgery

## 2023-02-17 ENCOUNTER — Ambulatory Visit (INDEPENDENT_AMBULATORY_CARE_PROVIDER_SITE_OTHER): Payer: Medicaid Other | Admitting: Surgery

## 2023-02-17 VITALS — BP 133/95 | HR 67 | Temp 98.0°F | Ht 71.0 in | Wt 229.0 lb

## 2023-02-17 DIAGNOSIS — Z09 Encounter for follow-up examination after completed treatment for conditions other than malignant neoplasm: Secondary | ICD-10-CM

## 2023-02-17 DIAGNOSIS — R1031 Right lower quadrant pain: Secondary | ICD-10-CM

## 2023-02-17 NOTE — Patient Instructions (Signed)
If you have any concerns or questions, please feel free to call our office.   Diagnostic Laparoscopy, Care After The following information offers guidance on how to care for yourself after your procedure. Your health care provider may also give you more specific instructions. If you have problems or questions, contact your health care provider. What can I expect after the procedure? After the procedure, it is common to have: Mild discomfort in the abdomen. Sore throat. Women who have laparoscopy with a pelvic examination may have mild cramping and fluid coming from the vagina for a few days after the procedure. Follow these instructions at home: Medicines Take over-the-counter and prescription medicines only as told by your health care provider. If you were prescribed an antibiotic medicine, take it as told by your health care provider. Do not stop taking the antibiotic even if you start to feel better. Ask your health care provider if the medicine prescribed to you: Requires you to avoid driving or using machinery. Can cause constipation. You may need to take these actions to prevent or treat constipation: Drink enough fluid to keep your urine pale yellow. Take over-the-counter or prescription medicines. Eat foods that are high in fiber, such as beans, whole grains, and fresh fruits and vegetables. Limit foods that are high in fat and processed sugars, such as fried or sweet foods. Incision care  Follow instructions from your health care provider about how to take care of your incisions. Make sure you: Wash your hands with soap and water for at least 20 seconds before and after you change your bandage (dressing). If soap and water are not available, use hand sanitizer. Change your dressing as told by your health care provider. Leave stitches (sutures), skin glue, or surgical tape in place. These skin closures may need to stay in place for 2 weeks or longer. If surgical tape edges start to  loosen and curl up, you may trim the loose edges. Do not remove the surgical tape completely unless your health care provider tells you to do that. Check your incision areas every day for signs of infection. Check for: Redness, swelling, or pain. Fluid or blood. Warmth. Pus or a bad smell. Activity Return to your normal activities as told by your health care provider. Ask your health care provider what activities are safe for you. Do not lift anything that is heavier than 10 lb (4.5 kg), or the limit that you are told, until your health care provider says that it is safe. Avoid sitting for a long time without moving. Get up to take short walks every 1-2 hours. This is important to improve blood flow and breathing. Ask for help if you feel weak or unsteady. General instructions Do not use any products that contain nicotine or tobacco. These products include cigarettes, chewing tobacco, and vaping devices, such as e-cigarettes. If you need help quitting, ask your health care provider. If you were given a sedative during the procedure, it can affect you for several hours. Do not drive or operate machinery until your health care provider says that it is safe. Do not take baths, swim, or use a hot tub until your health care provider approves. Ask your health care provider if you may take showers. You may only be allowed to take sponge baths. Keep all follow-up visits. This is important. Contact a health care provider if: You develop shoulder pain. You feel light-headed or faint. You are unable to pass gas or have a bowel movement. You feel  nauseous or you vomit. You develop a rash. You have any of these signs of infection: Redness, swelling, or pain around an incision. Fluid or blood coming from an incision. Warmth coming from an incision. Pus or a bad smell coming from an incision. A fever or chills. Get help right away if: You have severe pain. You have vomiting that does not go away. You  have heavy bleeding from the vagina. Any incision opens up. You have trouble breathing. You have chest pain. These symptoms may represent a serious problem that is an emergency. Do not wait to see if the symptoms will go away. Get medical help right away. Call your local emergency services (911 in the [place] Do not drive yourself to the hospital. Summary After the procedure, it is common to have mild discomfort in the abdomen and a sore throat. Check your incision areas every day for signs of infection. Return to your normal activities as told by your health care provider. Ask your health care provider what activities are safe for you. This information is not intended to replace advice given to you by your health care provider. Make sure you discuss any questions you have with your health care provider. Document Revised: 08/09/2020 Document Reviewed: 08/09/2020 Elsevier Patient Education  [place].

## 2023-02-17 NOTE — Progress Notes (Signed)
02/17/2023  HPI: CONAGHER FAIRLESS is a 62 y.o. male s/p diagnostic laparoscopy and lysis of adhesions in the right groin on 02/03/23.  He presents for follow up.  He reports that his pain in the right groin is much improved.  He does not feel the same knot that he had before and is less tender.  Reports soreness in the incisions, but no other issues.  Vital signs: BP (!) 133/95   Pulse 67   Temp 98 F (36.7 C) (Oral)   Ht 5' 11"$  (1.803 m)   Wt 229 lb (103.9 kg)   SpO2 96%   BMI 31.94 kg/m    Physical Exam: Constitutional:  No acute distress Abdomen:  soft, non-distended, with appropriate soreness at the incisions.  No pain in the right groin.  Assessment/Plan: This is a 62 y.o. male s/p diagnostic laparoscopy and lysis of adhesions.  --Discussed with the patient the findings in the OR with some adhesions of the appendix and right umbilical ligament towards the internal ring.  However, no hernia finding.  His pain has improved, and although could be as a result of the surgery, would be less likely.  Exparel was injected but by now would have worn off. --discussed with him that if his pain returns, he would need a referral to the pain clinic. --Follow up as needed   Melvyn Neth, [place]

## 2023-02-18 ENCOUNTER — Encounter: Payer: Self-pay | Admitting: Surgery

## 2023-02-23 DIAGNOSIS — R569 Unspecified convulsions: Secondary | ICD-10-CM | POA: Diagnosis not present

## 2023-02-25 DIAGNOSIS — G713 Mitochondrial myopathy, not elsewhere classified: Secondary | ICD-10-CM | POA: Diagnosis not present

## 2023-02-27 ENCOUNTER — Other Ambulatory Visit: Payer: Self-pay | Admitting: Family Medicine

## 2023-02-27 DIAGNOSIS — N529 Male erectile dysfunction, unspecified: Secondary | ICD-10-CM

## 2023-03-01 DIAGNOSIS — G713 Mitochondrial myopathy, not elsewhere classified: Secondary | ICD-10-CM | POA: Diagnosis not present

## 2023-03-01 DIAGNOSIS — E7140 Disorder of carnitine metabolism, unspecified: Secondary | ICD-10-CM | POA: Diagnosis not present

## 2023-03-01 DIAGNOSIS — E559 Vitamin D deficiency, unspecified: Secondary | ICD-10-CM | POA: Diagnosis not present

## 2023-03-02 ENCOUNTER — Telehealth: Payer: Self-pay | Admitting: Cardiovascular Disease

## 2023-03-02 NOTE — Telephone Encounter (Signed)
   Pre-operative Risk Assessment    Patient Name: [name] [name]  DOB: Jan 16, 1961 MRN: [id number]      Request for Surgical Clearance    Procedure:   Diagnostic Laparoscopy   Date of Surgery:  Clearance TBD                                 Surgeon:  Dr Olean Ree Surgeon's Group or Practice Name:  [facility] Phone number:  [phone] Fax number:  [phone]   Type of Clearance Requested:   - Medical    Type of Anesthesia:  General    Additional requests/questions:    SignedAce Gins   03/02/2023, 3:06 PM

## 2023-03-02 NOTE — Telephone Encounter (Signed)
     Primary Cardiologist: [name], MD  Chart reviewed as part of pre-operative protocol coverage. Given past medical history and time since last visit, based on ACC/AHA guidelines, [name] [name] would be at acceptable risk for the planned procedure without further cardiovascular testing.   Per Dr. Tito Dine patient has stable cardiac symptoms overall and normal EKG. Most recent cardiac catheterization in 2022 showed mild nonobstructive disease. He can proceed with surgery at an overall low risk.   I will route this recommendation to the requesting party via Epic fax function and remove from pre-op pool.  Please call with questions.  Jossie Ng. Christiane Sistare NP-C     03/02/2023, 3:46 PM [address] Office [phone] Fax [phone]

## 2023-03-03 ENCOUNTER — Telehealth: Payer: Self-pay

## 2023-03-03 NOTE — Telephone Encounter (Signed)
Cardiology clearance received from [facility] NP-patient is acceptable risk for the planned procedure without further cardiovascular testing.

## 2023-03-08 ENCOUNTER — Encounter: Payer: Self-pay | Admitting: Family Medicine

## 2023-03-09 ENCOUNTER — Other Ambulatory Visit: Payer: Self-pay | Admitting: Family Medicine

## 2023-03-09 DIAGNOSIS — E1129 Type 2 diabetes mellitus with other diabetic kidney complication: Secondary | ICD-10-CM

## 2023-03-10 ENCOUNTER — Encounter: Payer: Self-pay | Admitting: Family Medicine

## 2023-03-10 DIAGNOSIS — E1129 Type 2 diabetes mellitus with other diabetic kidney complication: Secondary | ICD-10-CM

## 2023-03-15 ENCOUNTER — Ambulatory Visit (INDEPENDENT_AMBULATORY_CARE_PROVIDER_SITE_OTHER): Payer: Medicaid Other

## 2023-03-15 DIAGNOSIS — E538 Deficiency of other specified B group vitamins: Secondary | ICD-10-CM

## 2023-03-15 MED ORDER — CYANOCOBALAMIN 1000 MCG/ML IJ SOLN
1000.0000 ug | Freq: Once | INTRAMUSCULAR | Status: AC
  Administered 2023-03-15: 1000 ug via INTRAMUSCULAR

## 2023-03-15 NOTE — Telephone Encounter (Signed)
Pt brought sugar log and after reviewing per Dr. Ancil Boozer we will not resume Trulicity.

## 2023-03-15 NOTE — Progress Notes (Signed)
Patient tolerated B-12 injection well with no immediate side effects noted.

## 2023-03-18 ENCOUNTER — Other Ambulatory Visit: Payer: Self-pay | Admitting: Family Medicine

## 2023-03-19 ENCOUNTER — Other Ambulatory Visit: Payer: Self-pay | Admitting: Gastroenterology

## 2023-03-19 ENCOUNTER — Encounter: Payer: Self-pay | Admitting: Family Medicine

## 2023-03-19 DIAGNOSIS — R634 Abnormal weight loss: Secondary | ICD-10-CM

## 2023-03-19 DIAGNOSIS — R1319 Other dysphagia: Secondary | ICD-10-CM

## 2023-03-22 ENCOUNTER — Other Ambulatory Visit: Payer: Self-pay

## 2023-03-22 DIAGNOSIS — R1319 Other dysphagia: Secondary | ICD-10-CM

## 2023-03-22 DIAGNOSIS — R634 Abnormal weight loss: Secondary | ICD-10-CM

## 2023-03-22 MED ORDER — OMEPRAZOLE 40 MG PO CPDR: DELAYED_RELEASE_CAPSULE | ORAL | 0 refills | Status: DC

## 2023-03-26 DIAGNOSIS — G713 Mitochondrial myopathy, not elsewhere classified: Secondary | ICD-10-CM | POA: Diagnosis not present

## 2023-04-12 ENCOUNTER — Other Ambulatory Visit: Payer: Self-pay | Admitting: Cardiology

## 2023-04-12 ENCOUNTER — Ambulatory Visit: Payer: Medicaid Other

## 2023-04-13 ENCOUNTER — Other Ambulatory Visit: Payer: Self-pay | Admitting: Family Medicine

## 2023-04-13 DIAGNOSIS — F5101 Primary insomnia: Secondary | ICD-10-CM

## 2023-04-15 DIAGNOSIS — R569 Unspecified convulsions: Secondary | ICD-10-CM | POA: Diagnosis not present

## 2023-04-16 DIAGNOSIS — R569 Unspecified convulsions: Secondary | ICD-10-CM | POA: Diagnosis not present

## 2023-04-17 DIAGNOSIS — R569 Unspecified convulsions: Secondary | ICD-10-CM | POA: Diagnosis not present

## 2023-04-18 DIAGNOSIS — R569 Unspecified convulsions: Secondary | ICD-10-CM | POA: Diagnosis not present

## 2023-04-20 NOTE — Progress Notes (Unsigned)
Name: [name] [name]   MRN: [id number]    DOB: [birth year]   Date:04/21/2023       Progress Note  Subjective  Chief Complaint  Referral/ B12  HPI  Vision changes: he states wakes up with blurred vision, he called  Eye and no longer under his plan. He has DM, glucose has been controlled even in the mornings 98-141  , we will refer him to ophthalmologist    Patient Active Problem List   Diagnosis Date Noted   Right groin pain 02/03/2023   Neurogenic muscular atrophy 05/08/2022   Other insomnia 05/08/2022   Controlled type 2 diabetes mellitus with microalbuminuria, without long-term current use of insulin 05/08/2022   Contraindication to statin medication 05/08/2022   Mitochondrial disease 01/22/2022   Tremor 01/22/2022   Shortness of breath 01/22/2022   Muscle atrophy of lower extremity 11/10/2021   Dysphagia    Asthma 05/14/2021   Multiple thyroid nodules 05/14/2021   Localized, primary osteoarthritis of hand 09/28/2019   Seizure-like activity 02/22/2018   Mild cognitive impairment 11/17/2017   Radiculopathy of cervical region 11/17/2017   Stenosis of carotid artery 09/08/2017   Cervical stenosis of spine 09/08/2017   B12 deficiency 06/28/2017   Headache disorder 02/15/2017   Memory loss or impairment 02/15/2017   Coronary artery disease involving native coronary artery of native heart    Radiculitis of left cervical region 03/05/2016   Diabetic neuropathy associated with type 2 diabetes mellitus 12/05/2015   Patellar subluxation 06/05/2015   Allergic rhinitis, seasonal 06/05/2015   Chronic constipation 06/05/2015   Chronic lower back pain 06/05/2015   Vitamin D deficiency 06/05/2015   Central sleep apnea 06/05/2015   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   Nerve root pain 06/05/2015   Acquired polycythemia 06/05/2015   Anterior knee pain 06/05/2015   Failure of erection 06/05/2015   Gastro-esophageal reflux disease without esophagitis 06/05/2015   Neuropathy 06/05/2015    Angina, class III 05/23/2015   Essential hypertension 05/23/2015   Hyperlipidemia 05/23/2015   Inguinal hernia without mention of obstruction or gangrene, recurrent unilateral or unspecified 03/24/2013    Past Surgical History:  Procedure Laterality Date   BACK SURGERY  12/2008   X2-LUMBAR   CARDIAC CATHETERIZATION N/A 05/30/2015   Procedure: Left Heart Cath;  Surgeon: Iran Ouch, MD;  Location: [facility]IVE CV [facility];  Service: Cardiovascular;  Laterality: N/A;   CARDIAC CATHETERIZATION N/A 04/16/2016   Procedure: Left Heart Cath and Coronary Angiography;  Surgeon: Iran Ouch, MD;  Location: [facility];  Service: Cardiovascular;  Laterality: N/A;   COLONOSCOPY  2014   Dr. Evette Cristal   COLONOSCOPY WITH PROPOFOL N/A 12/25/2016   Procedure: COLONOSCOPY WITH PROPOFOL;  Surgeon: Wyline Mood, MD;  Location: [facility] ENDOSCOPY;  Service: Endoscopy;  Laterality: N/A;   COLONOSCOPY WITH PROPOFOL N/A 01/29/2017   Procedure: COLONOSCOPY WITH PROPOFOL;  Surgeon: Wyline Mood, MD;  Location: [facility] ENDOSCOPY;  Service: Endoscopy;  Laterality: N/A;   COLONOSCOPY WITH PROPOFOL N/A 04/11/2020   Procedure: COLONOSCOPY WITH PROPOFOL;  Surgeon: Wyline Mood, MD;  Location: [facility] ENDOSCOPY;  Service: Gastroenterology;  Laterality: N/A;   CORONARY PRESSURE/FFR STUDY N/A 07/30/2021   Procedure: INTRAVASCULAR PRESSURE WIRE/FFR STUDY;  Surgeon: Iran Ouch, MD;  Location: [facility];  Service: Cardiovascular;  Laterality: N/A;   ESOPHAGEAL MANOMETRY N/A 03/04/2022   Procedure: ESOPHAGEAL MANOMETRY (EM);  Surgeon: Napoleon Form, MD;  Location: [facility] ENDOSCOPY;  Service: Gastroenterology;  Laterality: N/A;   ESOPHAGOGASTRODUODENOSCOPY (EGD) WITH PROPOFOL N/A  01/26/2019   Procedure: ESOPHAGOGASTRODUODENOSCOPY (EGD) WITH PROPOFOL;  Surgeon: Toney Reil, MD;  Location: [facility] ENDOSCOPY;  Service: Gastroenterology;  Laterality: N/A;   ESOPHAGOGASTRODUODENOSCOPY (EGD) WITH PROPOFOL N/A  09/10/2021   Procedure: ESOPHAGOGASTRODUODENOSCOPY (EGD) WITH PROPOFOL;  Surgeon: Toney Reil, MD;  Location: [facility] ENDOSCOPY;  Service: Gastroenterology;  Laterality: N/A;   EVALUATION UNDER ANESTHESIA WITH HEMORRHOIDECTOMY N/A 02/24/2017   Procedure: EXAM UNDER ANESTHESIA WITH POSSIBLE EXCISION OF INTERNAL HEMORRHOIDS;  Surgeon: Henrene Dodge, MD;  Location: [facility] ORS;  Service: General;  Laterality: N/A;   FISSURECTOMY  02/24/2017   Procedure: FISSURECTOMY;  Surgeon: Henrene Dodge, MD;  Location: [facility] ORS;  Service: General;;   HAND SURGERY Left 2020   HERNIA REPAIR Right 1991   INGUINAL HERNIA REPAIR Right 2012   Dr Sherral Hammers HERNIA REPAIR Right 2014   Dr Evette Cristal   LEFT HEART CATH AND CORONARY ANGIOGRAPHY Left 08/05/2020   Procedure: LEFT HEART CATH AND CORONARY ANGIOGRAPHY poss PCI;  Surgeon: Iran Ouch, MD;  Location: [facility];  Service: Cardiovascular;  Laterality: Left;   MUSCLE BIOPSY Left 12/31/2021   Procedure: LEFT VASTUS LATERALIS BIOPSY;  Surgeon: Venetia Night, MD;  Location: [facility] ORS;  Service: Neurosurgery;  Laterality: Left;   RIGHT/LEFT HEART CATH AND CORONARY ANGIOGRAPHY N/A 07/30/2021   Procedure: RIGHT/LEFT HEART CATH AND CORONARY ANGIOGRAPHY;  Surgeon: Iran Ouch, MD;  Location: [facility];  Service: Cardiovascular;  Laterality: N/A;   WRIST SURGERY Left    XI ROBOT ASSISTED DIAGNOSTIC LAPAROSCOPY Right 02/03/2023   Procedure: DIAGNOSTIC LAPAROSCOPY;  Surgeon: Henrene Dodge, MD;  Location: [facility] ORS;  Service: General;  Laterality: Right;    Family History  Problem Relation Age of Onset   Hypertension Mother    Heart Problems Mother    High Cholesterol Mother    Heart Problems Father 37       myocardial infarction   Mental illness Brother    Other Maternal Aunt        COVID   Lung cancer Maternal Aunt    Lung cancer Maternal Aunt    Cancer Maternal Uncle        unknown   Prostate cancer Maternal Uncle     Testicular cancer Paternal Uncle    Thyroid disease Daughter    Breast cancer Cousin     Social History   Tobacco Use   Smoking status: Former    Types: Cigars    Start date: 12/28/2002    Quit date: 06/04/2005    Years since quitting: 17.8   Smokeless tobacco: Never   Tobacco comments:    quit 2006-smoked 1 cigar occ  Substance Use Topics   Alcohol use: Not Currently    Comment: has not had a drink in 8 months 02/2021     Current Outpatient Medications:    ACCU-CHEK GUIDE test strip, USE TO TEST BLOOD SUGAR THREE TIMES DAILY AS NEEDED, Disp: 100 strip, Rfl: 1   Accu-Chek Softclix Lancets lancets, TEST TWICE DAILY, Disp: 100 each, Rfl: 3   acetaminophen (TYLENOL) 500 MG tablet, Take 2 tablets (1,000 mg total) by mouth every 6 (six) hours as needed for mild pain., Disp: , Rfl:    Alpha Lipoic Acid 200 MG CAPS, Take 200 mg by mouth daily., Disp: , Rfl:    blood glucose meter kit and supplies, 1 each by Other route in the morning and at bedtime. Dispense based on patient and insurance preference. Use up to two  times  daily. (FOR ICD-10 E10.9, E11.9)., Disp: 1 each, Rfl: 0   carvedilol (COREG) 6.25 MG tablet, TAKE 1 TABLET(6.25 MG) BY MOUTH TWICE DAILY, Disp: 180 tablet, Rfl: 1   Coenzyme Q10 (COQ-10) 100 MG CAPS, Take 100 mg by mouth daily., Disp: , Rfl:    ezetimibe (ZETIA) 10 MG tablet, Take 1 tablet (10 mg total) by mouth daily. (Patient taking differently: Take 10 mg by mouth every morning.), Disp: 90 tablet, Rfl: 3   fluticasone (FLONASE) 50 MCG/ACT nasal spray, Place 1 spray into both nostrils daily as needed for allergies or rhinitis., Disp: , Rfl:    fluticasone furoate-vilanterol (BREO ELLIPTA) 100-25 MCG/ACT AEPB, Inhale 1 puff into the lungs daily., Disp: 60 each, Rfl: 6   ibuprofen (ADVIL) 600 MG tablet, Take 1 tablet (600 mg total) by mouth every 8 (eight) hours as needed for moderate pain., Disp: 60 tablet, Rfl: 0   ketoconazole (NIZORAL) 2 % shampoo, Apply 1 Application  topically 3 (three) times a week. Wash scalp/body 3 times a week as needed for alres, let sit 5 minutes before rinsing off, avoid eyes. May use once monthly for maintenance. (Patient taking differently: Apply 1 Application topically 2 (two) times a week. Wash scalp/body 3 times a week as needed for alres, let sit 5 minutes before rinsing off, avoid eyes. May use once monthly for maintenance.), Disp: 120 mL, Rfl: 11   levOCARNitine (CARNITOR) 330 MG tablet, Take 330 mg by mouth 2 (two) times daily., Disp: , Rfl:    losartan-hydrochlorothiazide (HYZAAR) 50-12.5 MG tablet, TAKE 1 TABLET BY MOUTH DAILY, Disp: 90 tablet, Rfl: 2   mupirocin ointment (BACTROBAN) 2 %, Apply 1 application topically daily. Qd to excision site, Disp: 22 g, Rfl: 1   nitroGLYCERIN (NITROSTAT) 0.4 MG SL tablet, Place 1 tablet (0.4 mg total) under the tongue every 5 (five) minutes as needed for chest pain., Disp: , Rfl:    omeprazole (PRILOSEC) 40 MG capsule, TAKE 1 CAPSULE(40 MG) BY MOUTH TWICE DAILY BEFORE A MEAL, Disp: 60 capsule, Rfl: 0   oxyCODONE (OXY IR/ROXICODONE) 5 MG immediate release tablet, Take 1 tablet (5 mg total) by mouth every 6 (six) hours as needed for severe pain., Disp: 20 tablet, Rfl: 0   pregabalin (LYRICA) 50 MG capsule, Take 50 mg by mouth every morning., Disp: , Rfl:    ranolazine (RANEXA) 1000 MG SR tablet, TAKE 1 TABLET(1000 MG) BY MOUTH TWICE DAILY, Disp: 180 tablet, Rfl: 0   Riboflavin (B-2) 100 MG TABS, Take 100 mg by mouth daily., Disp: , Rfl:    rosuvastatin (CRESTOR) 10 MG tablet, Take 1 tablet (10 mg total) by mouth daily., Disp: 90 tablet, Rfl: 3   sildenafil (VIAGRA) 100 MG tablet, Take 1 tablet (100 mg total) by mouth daily as needed for erectile dysfunction., Disp: 30 tablet, Rfl: 0   temazepam (RESTORIL) 15 MG capsule, TAKE 1 CAPSULE(15 MG) BY MOUTH AT BEDTIME, Disp: 90 capsule, Rfl: 0   Vitamin D, Ergocalciferol, (DRISDOL) 1.25 MG (50000 UNIT) CAPS capsule, Take 1 capsule (50,000 Units total)  by mouth every 7 (seven) days., Disp: 12 capsule, Rfl: 1  Allergies  Allergen Reactions   Gabapentin Other (See Comments)    Groggy-Mood Changes   Latex Rash   Penicillins Hives and Rash    Tolerated 1st generation cephalosporin (CEFAZOLIN) on 12/31/2021 with no documented ADRs.  PCN reaction causing immediate rash, facial/tongue/throat swelling, SOB or lightheadedness with hypotension: Yes PCN reaction causing severe rash involving mucus membranes or skin necrosis:  No PCN reaction that required hospitalization No PCN reaction occurring within the last 10 years: No If all of the above answers are "NO", then may proceed with Cephalosporin use.     I personally reviewed active problem list, medication list, allergies, family history, social history, health maintenance with the patient/caregiver today.   ROS  Ten systems reviewed and is negative except as mentioned in HPI   Objective  Vitals:   04/21/23 1116  BP: 122/78  Pulse: 86  Resp: 16  SpO2: 94%  Weight: 234 lb (106.1 kg)  Height: 5\' 11"  (1.803 m)    Body mass index is 32.64 kg/m.  Physical Exam  Constitutional: Patient appears well-developed and well-nourished. Obese  No distress.  HEENT: head atraumatic, normocephalic, pupils equal and reactive to light, neck supple Cardiovascular: Normal rate, regular rhythm and normal heart sounds.  No murmur heard. No BLE edema. Pulmonary/Chest: Effort normal and breath sounds normal. No respiratory distress. Abdominal: Soft.  There is no tenderness. Psychiatric: Patient has a normal mood and affect. behavior is normal. Judgment and thought content normal.   Recent Results (from the past 2160 hour(s))  Glucose, capillary     Status: None   Collection Time: 02/03/23 11:35 AM  Result Value Ref Range   Glucose-Capillary 97 70 - 99 mg/dL    Comment: Glucose reference range applies only to samples taken after fasting for at least 8 hours.  Basic metabolic panel     Status:  Abnormal   Collection Time: 02/03/23  1:21 PM  Result Value Ref Range   Sodium 137 135 - 145 mmol/L   Potassium 3.1 (L) 3.5 - 5.1 mmol/L   Chloride 101 98 - 111 mmol/L   CO2 26 22 - 32 mmol/L   Glucose, Bld 93 70 - 99 mg/dL    Comment: Glucose reference range applies only to samples taken after fasting for at least 8 hours.   BUN 14 6 - 20 mg/dL   Creatinine, Ser 1.61 0.61 - 1.24 mg/dL   Calcium 8.5 (L) 8.9 - 10.3 mg/dL   GFR, Estimated >09 >60 mL/min    Comment: (NOTE) Calculated using the CKD-EPI Creatinine Equation (2021)    Anion gap 10 5 - 15    Comment: Performed at [facility], [address] [place], [place] [address]  Glucose, capillary     Status: Abnormal   Collection Time: 02/03/23  3:16 PM  Result Value Ref Range   Glucose-Capillary 140 (H) 70 - 99 mg/dL    Comment: Glucose reference range applies only to samples taken after fasting for at least 8 hours.  POCT HgB A1C     Status: Abnormal   Collection Time: 02/08/23  9:30 AM  Result Value Ref Range   Hemoglobin A1C 5.7 (A) 4.0 - 5.6 %   HbA1c POC (<> result, manual entry)     HbA1c, POC (prediabetic range)     HbA1c, POC (controlled diabetic range)      PHQ2/9:    04/21/2023   11:17 AM 02/08/2023    9:29 AM 12/31/2022   10:06 AM 11/04/2022   10:20 AM 07/29/2022    8:36 AM  Depression screen PHQ 2/9  Decreased Interest 0 0 0 0 0  Down, Depressed, Hopeless 0 0 0 0 0  PHQ - 2 Score 0 0 0 0 0  Altered sleeping 0 0 0 0   Tired, decreased energy 0 0 0 0   Change in appetite 0 0 0 0  Feeling bad or failure about yourself  0 0 0 0   Trouble concentrating 0 0 0 0   Moving slowly or fidgety/restless 0 0 0 0   Suicidal thoughts 0 0 0 0   PHQ-9 Score 0 0 0 0     phq 9 is negative   Fall Risk:    04/21/2023   11:16 AM 02/08/2023    9:29 AM 12/31/2022   10:05 AM 11/04/2022   10:20 AM 07/29/2022    8:36 AM  Fall Risk   Falls in the past year? 0 0 1 1 0  Number falls in past yr: 0 0 0 1 0  Injury with  Fall? 0 0 0 0 0  Risk for fall due to : Impaired balance/gait;No Fall Risks Impaired balance/gait Impaired balance/gait Impaired balance/gait No Fall Risks  Follow up Falls prevention discussed Falls prevention discussed Falls prevention discussed Falls prevention discussed Falls prevention discussed      Functional Status Survey: Is the patient deaf or have difficulty hearing?: No Does the patient have difficulty seeing, even when wearing glasses/contacts?: No Does the patient have difficulty concentrating, remembering, or making decisions?: No Does the patient have difficulty walking or climbing stairs?: No Does the patient have difficulty dressing or bathing?: No Does the patient have difficulty doing errands alone such as visiting a doctor's office or shopping?: No    Assessment & Plan  1. Dyslipidemia associated with type 2 diabetes mellitus  - Ambulatory referral to Ophthalmology  2. B12 deficiency  - cyanocobalamin (VITAMIN B12) injection 1,000 mcg

## 2023-04-21 ENCOUNTER — Other Ambulatory Visit: Payer: Self-pay | Admitting: Gastroenterology

## 2023-04-21 ENCOUNTER — Encounter: Payer: Self-pay | Admitting: Family Medicine

## 2023-04-21 ENCOUNTER — Ambulatory Visit (INDEPENDENT_AMBULATORY_CARE_PROVIDER_SITE_OTHER): Payer: Medicaid Other | Admitting: Family Medicine

## 2023-04-21 ENCOUNTER — Other Ambulatory Visit: Payer: Self-pay | Admitting: Pulmonary Disease

## 2023-04-21 ENCOUNTER — Other Ambulatory Visit: Payer: Self-pay | Admitting: Family Medicine

## 2023-04-21 VITALS — BP 122/78 | HR 86 | Resp 16 | Ht 71.0 in | Wt 234.0 lb

## 2023-04-21 DIAGNOSIS — E1129 Type 2 diabetes mellitus with other diabetic kidney complication: Secondary | ICD-10-CM

## 2023-04-21 DIAGNOSIS — E538 Deficiency of other specified B group vitamins: Secondary | ICD-10-CM

## 2023-04-21 DIAGNOSIS — E785 Hyperlipidemia, unspecified: Secondary | ICD-10-CM | POA: Diagnosis not present

## 2023-04-21 DIAGNOSIS — R809 Proteinuria, unspecified: Secondary | ICD-10-CM

## 2023-04-21 DIAGNOSIS — F5101 Primary insomnia: Secondary | ICD-10-CM

## 2023-04-21 DIAGNOSIS — R634 Abnormal weight loss: Secondary | ICD-10-CM

## 2023-04-21 DIAGNOSIS — R1319 Other dysphagia: Secondary | ICD-10-CM

## 2023-04-21 DIAGNOSIS — E1169 Type 2 diabetes mellitus with other specified complication: Secondary | ICD-10-CM

## 2023-04-21 MED ORDER — CYANOCOBALAMIN 1000 MCG/ML IJ SOLN
1000.0000 ug | Freq: Once | INTRAMUSCULAR | Status: AC
Start: 2023-04-21 — End: 2023-04-21
  Administered 2023-04-21: 1000 ug via INTRAMUSCULAR

## 2023-04-21 NOTE — Patient Instructions (Signed)
You need Tdap at local pharmacy

## 2023-04-22 ENCOUNTER — Other Ambulatory Visit: Payer: Self-pay

## 2023-04-22 DIAGNOSIS — G713 Mitochondrial myopathy, not elsewhere classified: Secondary | ICD-10-CM | POA: Diagnosis not present

## 2023-04-22 DIAGNOSIS — R531 Weakness: Secondary | ICD-10-CM | POA: Diagnosis not present

## 2023-04-26 ENCOUNTER — Other Ambulatory Visit: Payer: Self-pay

## 2023-04-26 DIAGNOSIS — G713 Mitochondrial myopathy, not elsewhere classified: Secondary | ICD-10-CM | POA: Diagnosis not present

## 2023-04-30 ENCOUNTER — Other Ambulatory Visit: Payer: Self-pay

## 2023-05-01 DIAGNOSIS — G713 Mitochondrial myopathy, not elsewhere classified: Secondary | ICD-10-CM | POA: Diagnosis not present

## 2023-05-02 ENCOUNTER — Other Ambulatory Visit: Payer: Self-pay | Admitting: Surgery

## 2023-05-03 ENCOUNTER — Encounter: Payer: Self-pay | Admitting: Gastroenterology

## 2023-05-03 ENCOUNTER — Encounter: Payer: Self-pay | Admitting: Family Medicine

## 2023-05-03 ENCOUNTER — Ambulatory Visit: Payer: Medicaid Other | Admitting: Gastroenterology

## 2023-05-03 VITALS — BP 138/82 | HR 86 | Temp 98.1°F | Ht 71.0 in | Wt 236.4 lb

## 2023-05-03 DIAGNOSIS — R151 Fecal smearing: Secondary | ICD-10-CM

## 2023-05-03 DIAGNOSIS — K219 Gastro-esophageal reflux disease without esophagitis: Secondary | ICD-10-CM | POA: Diagnosis not present

## 2023-05-03 NOTE — Telephone Encounter (Signed)
Can we refill this? IS it once a day or twice a day

## 2023-05-03 NOTE — Patient Instructions (Signed)
Kegel Exercises  Kegel exercises can help strengthen your pelvic floor muscles. The pelvic floor is a group of muscles that support your rectum, small intestine, and bladder. In females, pelvic floor muscles also help support the uterus. These muscles help you control the flow of urine and stool (feces). Kegel exercises are painless and simple. They do not require any equipment. Your provider may suggest Kegel exercises to: Improve bladder and bowel control. Improve sexual response. Improve weak pelvic floor muscles after surgery to remove the uterus (hysterectomy) or after pregnancy, in females. Improve weak pelvic floor muscles after prostate gland removal or surgery, in males. Kegel exercises involve squeezing your pelvic floor muscles. These are the same muscles you squeeze when you try to stop the flow of urine or keep from passing gas. The exercises can be done while sitting, standing, or lying down, but it is best to vary your position. Ask your health care provider which exercises are safe for you. Do exercises exactly as told by your health care provider and adjust them as directed. Do not begin these exercises until told by your health care provider. Exercises How to do Kegel exercises: Squeeze your pelvic floor muscles tight. You should feel a tight lift in your rectal area. If you are a male, you should also feel a tightness in your vaginal area. Keep your stomach, buttocks, and legs relaxed. Hold the muscles tight for up to 10 seconds. Breathe normally. Relax your muscles for up to 10 seconds. Repeat as told by your health care provider. Repeat this exercise daily as told by your health care provider. Continue to do this exercise for at least 4-6 weeks, or for as long as told by your health care provider. You may be referred to a physical therapist who can help you learn more about how to do Kegel exercises. Depending on your condition, your health care provider may  recommend: Varying how long you squeeze your muscles. Doing several sets of exercises every day. Doing exercises for several weeks. Making Kegel exercises a part of your regular exercise routine. This information is not intended to replace advice given to you by your health care provider. Make sure you discuss any questions you have with your health care provider. Document Revised: 04/24/2021 Document Reviewed: 04/24/2021 Elsevier Patient Education  2023 Elsevier Inc.  

## 2023-05-03 NOTE — Progress Notes (Signed)
Arlyss Repress, MD [address]  [address]  [place], [place] [address]  Main: [phone]  Fax: [phone]    Gastroenterology Consultation  Referring Provider:     Alba Cory, MD Primary Care Physician:  Alba Cory, MD Primary Gastroenterologist:  Dr. Arlyss Repress Reason for Consultation: Mucus drainage per rectum, chronic GERD        HPI:   [name] [name] is a 62 y.o. male referred by Dr. Alba Cory, MD  for consultation & management of chronic GERD and mucus drainage per rectum. 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  He also had mild chronic neurogenic atrophy, closely followed by [facility] neurolog[facility].  He has an appointment to see a [facility] neurology on April 12.  Patient underwent endoscopic evaluation including EGD, colonoscopy, CT abdomen and pelvis which were all unremarkable. Patient had history of mild oropharyngeal dysphagia confirmed by speech pathology.  Patient has significant weight loss secondary to dysphagia.  He has regained his weight, dysphagia has significantly improved.  Continues to take omeprazole 40 mg twice daily.  He does report occasional constipation and episodes of mucus drainage per rectum which happens about once a month associated with fecal soiling.  He has increased his protein intake significantly, tries to follow healthy diet.  He does not have any other GI concerns today.  NSAIDs: None   Antiplts/Anticoagulants/Anti thrombotics: None   GI Procedures:  Colonoscopy 04/11/2020 Good prep, normal colon  EGD 01/26/2019 - Normal duodenal bulb and second portion of the duodenum. - Normal stomach. Biopsied. - Esophagogastric landmarks identified. - Normal gastroesophageal junction and esophagus.   DIAGNOSIS:  A.  STOMACH; COLD BIOPSY:  - ANTRAL MUCOSA WITH MILD NON-SPECIFIC CHRONIC AND FOCAL ACTIVE  GASTRITIS.  - UNREMARKABLE OXYNTIC MUCOSA.  - NEGATIVE  FOR HELICOBACTER; IHC STAIN EXAMINED.  - NEGATIVE FOR INTESTINAL METAPLASIA, DYSPLASIA, AND MALIGNANCY.    Colonoscopy in 2017 and 2018 by Dr. Tobi Bastos - Preparation of the colon was poor. - Stool in the sigmoid colon. - No specimens collected.   - Three 5 to 7 mm polyps in the transverse colon, removed with a cold snare. Resected and retrieved. - One 3 mm polyp in the ascending colon, removed with a cold biopsy forceps. Resected and retrieved. - Non-bleeding internal hemorrhoids. - The examination was otherwise normal on direct and retroflexion views.   DIAGNOSIS:  A. COLON POLYP, ASCENDING; COLD BIOPSY:  - TUBULAR ADENOMA.  - NEGATIVE FOR HIGH-GRADE DYSPLASIA AND MALIGNANCY.   B.  COLON POLYP 2, TRANSVERSE; COLD SNARE:  - TUBULAR ADENOMAS (2), NEGATIVE FOR HIGH-GRADE DYSPLASIA AND  MALIGNANCY.  - POLYPOID COLONIC MUCOSA WITH HEMORRHAGE OF THE LAMINA PROPRIA (1).    Past Medical History:  Diagnosis Date   Allergy    Angina, class III (HCC)    CAD (coronary artery disease)    a. 05/30/2015 cath: LM nl, mLAD 50% (FFR 0.83), LCx minor irregs, RCA minor irregs, EF 55-65%-->Med Rx; b. 03/2016 Cath: LM nl LAD 16m, D1/2/3 min irregs, LCX min irregs, OM1/2/3 nl, RCA min irregs, RPDA/RPAV/RPL1/RPL2 nl-->Med Rx; b. 07/2020 Cath: LM nl, LAD 53m, D1/2/3 min irregs, LCX min irregs, RCA min irregs, RPDA/RPAV/RPL1,2 nl, EF 55-65%.   Chronic constipation    Degeneration of intervertebral disc of cervical region    Diastolic dysfunction    a.) TTE 07/22/2017: EF 60-65%, G1DD; b.) TTE 02/14/2021: EF 60-65%, G1DD; c.) TTE 01/19/2023: EF 55-60%, AoV sclerosis without stenosis, G1DD  Dyspnea    Erectile dysfunction    a.) on PDE5i (sildenafil)   GERD (gastroesophageal reflux disease)    Hernia 1991   Hyperlipidemia    Hypertension 2008   Mitochondrial myopathy    a.) followed by medical genetics specialist (Dr. Kaleen Mask, MD) at [facility]   Nerve root pain    Neuropathy    Obesity, unspecified 2012    OSA on CPAP    Personal history of tobacco use, presenting hazards to health 2012   Polycythemia    Rectal bleeding 02/11/2017   Recurrent Right Inguinal Hernia Repair 02/09/2011, 04/07/2013   Seizure (HCC)    pt states neurologist thinking he was having seizures while sleeping-eeg done 10-2022 and was normal   Sleep difficulties    a.) on BZO (temazepam) PRN   T2DM (type 2 diabetes mellitus) (HCC)    Umbilical hernia without mention of obstruction or gangrene 02/09/2011   Vitamin D deficiency     Past Surgical History:  Procedure Laterality Date   BACK SURGERY  12/2008   X2-LUMBAR   CARDIAC CATHETERIZATION N/A 05/30/2015   Procedure: Left Heart Cath;  Surgeon: Iran Ouch, MD;  Location: [facility]IVE CV [facility];  Service: Cardiovascular;  Laterality: N/A;   CARDIAC CATHETERIZATION N/A 04/16/2016   Procedure: Left Heart Cath and Coronary Angiography;  Surgeon: Iran Ouch, MD;  Location: [facility];  Service: Cardiovascular;  Laterality: N/A;   COLONOSCOPY  2014   Dr. Evette Cristal   COLONOSCOPY WITH PROPOFOL N/A 12/25/2016   Procedure: COLONOSCOPY WITH PROPOFOL;  Surgeon: Wyline Mood, MD;  Location: [facility] ENDOSCOPY;  Service: Endoscopy;  Laterality: N/A;   COLONOSCOPY WITH PROPOFOL N/A 01/29/2017   Procedure: COLONOSCOPY WITH PROPOFOL;  Surgeon: Wyline Mood, MD;  Location: [facility] ENDOSCOPY;  Service: Endoscopy;  Laterality: N/A;   COLONOSCOPY WITH PROPOFOL N/A 04/11/2020   Procedure: COLONOSCOPY WITH PROPOFOL;  Surgeon: Wyline Mood, MD;  Location: [facility] ENDOSCOPY;  Service: Gastroenterology;  Laterality: N/A;   CORONARY PRESSURE/FFR STUDY N/A 07/30/2021   Procedure: INTRAVASCULAR PRESSURE WIRE/FFR STUDY;  Surgeon: Iran Ouch, MD;  Location: [facility];  Service: Cardiovascular;  Laterality: N/A;   ESOPHAGEAL MANOMETRY N/A 03/04/2022   Procedure: ESOPHAGEAL MANOMETRY (EM);  Surgeon: Napoleon Form, MD;  Location: [facility] ENDOSCOPY;  Service: Gastroenterology;   Laterality: N/A;   ESOPHAGOGASTRODUODENOSCOPY (EGD) WITH PROPOFOL N/A 01/26/2019   Procedure: ESOPHAGOGASTRODUODENOSCOPY (EGD) WITH PROPOFOL;  Surgeon: Toney Reil, MD;  Location: [facility] ENDOSCOPY;  Service: Gastroenterology;  Laterality: N/A;   ESOPHAGOGASTRODUODENOSCOPY (EGD) WITH PROPOFOL N/A 09/10/2021   Procedure: ESOPHAGOGASTRODUODENOSCOPY (EGD) WITH PROPOFOL;  Surgeon: Toney Reil, MD;  Location: [facility] ENDOSCOPY;  Service: Gastroenterology;  Laterality: N/A;   EVALUATION UNDER ANESTHESIA WITH HEMORRHOIDECTOMY N/A 02/24/2017   Procedure: EXAM UNDER ANESTHESIA WITH POSSIBLE EXCISION OF INTERNAL HEMORRHOIDS;  Surgeon: Henrene Dodge, MD;  Location: [facility] ORS;  Service: General;  Laterality: N/A;   FISSURECTOMY  02/24/2017   Procedure: FISSURECTOMY;  Surgeon: Henrene Dodge, MD;  Location: [facility] ORS;  Service: General;;   HAND SURGERY Left 2020   HERNIA REPAIR Right 1991   INGUINAL HERNIA REPAIR Right 2012   Dr Sherral Hammers HERNIA REPAIR Right 2014   Dr Evette Cristal   LEFT HEART CATH AND CORONARY ANGIOGRAPHY Left 08/05/2020   Procedure: LEFT HEART CATH AND CORONARY ANGIOGRAPHY poss PCI;  Surgeon: Iran Ouch, MD;  Location: [facility];  Service: Cardiovascular;  Laterality: Left;   MUSCLE BIOPSY Left 12/31/2021   Procedure:  LEFT VASTUS LATERALIS BIOPSY;  Surgeon: Venetia Night, MD;  Location: [facility] OR[facility];  Service: Neurosurgery;  Laterality: Left;   RIGHT/LEFT HEART CATH AND CORONARY ANGIOGRAPHY N/A 07/30/2021   Procedure: RIGHT/LEFT HEART CATH AND CORONARY ANGIOGRAPHY;  Surgeon: Iran Ouch, MD;  Location: [facility];  Service: Cardiovascular;  Laterality: N/A;   WRIST SURGERY Left    XI ROBOT ASSISTED DIAGNOSTIC LAPAROSCOPY Right 02/03/2023   Procedure: DIAGNOSTIC LAPAROSCOPY;  Surgeon: Henrene Dodge, MD;  Location: [facility] OR[facility];  Service: General;  Laterality: Right;     Current Outpatient Medications:    ACCU-CHEK GUIDE test strip, USE TO TEST BLOOD  SUGAR THREE TIMES DAILY AS NEEDED, Disp: 100 strip, Rfl: 1   Accu-Chek Softclix Lancets lancets, TEST TWICE DAILY, Disp: 100 each, Rfl: 3   acetaminophen (TYLENOL) 500 MG tablet, Take 2 tablets (1,000 mg total) by mouth every 6 (six) hours as needed for mild pain., Disp: , Rfl:    Alpha Lipoic Acid 200 MG CAPS, Take 200 mg by mouth daily., Disp: , Rfl:    blood glucose meter kit and supplies, 1 each by Other route in the morning and at bedtime. Dispense based on patient and insurance preference. Use up to two  times daily. (FOR ICD-10 E10.9, E11.9)., Disp: 1 each, Rfl: 0   carvedilol (COREG) 6.25 MG tablet, TAKE 1 TABLET(6.25 MG) BY MOUTH TWICE DAILY, Disp: 180 tablet, Rfl: 1   Coenzyme Q10 (COQ-10) 100 MG CAPS, Take 100 mg by mouth daily., Disp: , Rfl:    dapagliflozin propanediol (FARXIGA) 10 MG TABS tablet, Take 10 mg by mouth daily., Disp: , Rfl:    ezetimibe (ZETIA) 10 MG tablet, Take 1 tablet (10 mg total) by mouth daily. (Patient taking differently: Take 10 mg by mouth every morning.), Disp: 90 tablet, Rfl: 3   fluticasone (FLONASE) 50 MCG/ACT nasal spray, Place 1 spray into both nostrils daily as needed for allergies or rhinitis., Disp: , Rfl:    fluticasone furoate-vilanterol (BREO ELLIPTA) 100-25 MCG/ACT AEPB, Inhale 1 puff into the lungs daily., Disp: 60 each, Rfl: 6   ibuprofen (ADVIL) 600 MG tablet, Take 1 tablet (600 mg total) by mouth every 8 (eight) hours as needed for moderate pain., Disp: 60 tablet, Rfl: 0   ketoconazole (NIZORAL) 2 % shampoo, Apply 1 Application topically 3 (three) times a week. Wash scalp/body 3 times a week as needed for alres, let sit 5 minutes before rinsing off, avoid eyes. May use once monthly for maintenance. (Patient taking differently: Apply 1 Application topically 2 (two) times a week. Wash scalp/body 3 times a week as needed for alres, let sit 5 minutes before rinsing off, avoid eyes. May use once monthly for maintenance.), Disp: 120 mL, Rfl: 11    levOCARNitine (CARNITOR) 330 MG tablet, Take 330 mg by mouth 2 (two) times daily., Disp: , Rfl:    losartan-hydrochlorothiazide (HYZAAR) 50-12.5 MG tablet, TAKE 1 TABLET BY MOUTH DAILY, Disp: 90 tablet, Rfl: 2   mupirocin ointment (BACTROBAN) 2 %, Apply 1 application topically daily. Qd to excision site, Disp: 22 g, Rfl: 1   nitroGLYCERIN (NITROSTAT) 0.4 MG SL tablet, Place 1 tablet (0.4 mg total) under the tongue every 5 (five) minutes as needed for chest pain., Disp: , Rfl:    omeprazole (PRILOSEC) 40 MG capsule, TAKE 1 CAPSULE(40 MG) BY MOUTH TWICE DAILY BEFORE A MEAL, Disp: 180 capsule, Rfl: 0   pregabalin (LYRICA) 50 MG capsule, Take 50 mg by mouth every morning., Disp: , Rfl:  ranolazine (RANEXA) 1000 MG SR tablet, TAKE 1 TABLET(1000 MG) BY MOUTH TWICE DAILY, Disp: 180 tablet, Rfl: 0   Riboflavin (B-2) 100 MG TABS, Take 100 mg by mouth daily., Disp: , Rfl:    rosuvastatin (CRESTOR) 10 MG tablet, Take 1 tablet (10 mg total) by mouth daily., Disp: 90 tablet, Rfl: 3   sildenafil (VIAGRA) 100 MG tablet, Take 1 tablet (100 mg total) by mouth daily as needed for erectile dysfunction., Disp: 30 tablet, Rfl: 0   temazepam (RESTORIL) 15 MG capsule, TAKE 1 CAPSULE(15 MG) BY MOUTH AT BEDTIME, Disp: 90 capsule, Rfl: 0   Vitamin D, Ergocalciferol, (DRISDOL) 1.25 MG (50000 UNIT) CAPS capsule, Take 1 capsule (50,000 Units total) by mouth every 7 (seven) days., Disp: 12 capsule, Rfl: 1   Family History  Problem Relation Age of Onset   Hypertension Mother    Heart Problems Mother    High Cholesterol Mother    Heart Problems Father 35       myocardial infarction   Mental illness Brother    Other Maternal Aunt        COVID   Lung cancer Maternal Aunt    Lung cancer Maternal Aunt    Cancer Maternal Uncle        unknown   Prostate cancer Maternal Uncle    Testicular cancer Paternal Uncle    Thyroid disease Daughter    Breast cancer Cousin      Social History   Tobacco Use   Smoking status:  Former    Types: Cigars    Start date: 12/28/2002    Quit date: 06/04/2005    Years since quitting: 17.9   Smokeless tobacco: Never   Tobacco comments:    quit 2006-smoked 1 cigar occ  Vaping Use   Vaping Use: Never used  Substance Use Topics   Alcohol use: Not Currently    Comment: has not had a drink in 8 months 02/2021   Drug use: No    Allergies as of 05/03/2023 - Review Complete 05/03/2023  Allergen Reaction Noted   Gabapentin Other (See Comments) 04/16/2015   Latex Rash 06/20/2015   Penicillins Hives and Rash 03/23/2013    Review of Systems:    All systems reviewed and negative except where noted in HPI.   Physical Exam:  BP 138/82 (BP Location: Right Arm, Patient Position: Sitting, Cuff Size: Normal)   Pulse 86   Temp 98.1 F (36.7 C) (Oral)   Ht 5\' 11"  (1.803 m)   Wt 236 lb 6 oz (107.2 kg)   BMI 32.97 kg/m  No LMP for male patient.  General:   Alert,  Well-developed, well-nourished, pleasant and cooperative in NAD Head:  Normocephalic and atraumatic. Eyes:  Sclera clear, no icterus.   Conjunctiva pink. Ears:  Normal auditory acuity. Nose:  No deformity, discharge, or lesions. Mouth:  No deformity or lesions,oropharynx pink & moist. Neck:  Supple; no masses or thyromegaly. Lungs:  Respirations even and unlabored.  Clear throughout to auscultation.   No wheezes, crackles, or rhonchi. No acute distress. Heart:  Regular rate and rhythm; no murmurs, clicks, rubs, or gallops. Abdomen:  Normal bowel sounds. Soft, non-tender and non-distended without masses, hepatosplenomegaly or hernias noted.  No guarding or rebound tenderness.   Rectal: Not performed Msk:  Symmetrical without gross deformities. Good, equal movement & strength bilaterally. Pulses:  Normal pulses noted. Extremities:  No clubbing or edema.  No cyanosis. Neurologic:  Alert and oriented x3;  grossly normal neurologically. Skin:  Intact without significant lesions or rashes. No jaundice. Psych:  Alert and  cooperative. Normal mood and affect.  Imaging Studies: Reviewed  Assessment and Plan:   [name] [name] is a 62 y.o. male with history of metabolic syndrome, history of mitochondrial myopathy, chronic neurogenic atrophy, mild oropharyngeal dysphagia and history of weight loss.  Dysphagia and weight loss have resolved.  Patient was on Trulicity in the past which has been discontinued.  Patient underwent extensive GI work-up including upper endoscopy with esophageal biopsies, esophageal manometry which were unremarkable.  CT abdomen and pelvis was normal.  Gastric emptying study was normal.  Speech pathology evaluation revealed mild oropharyngeal dysphagia.  Patient also has generalized muscle weakness, underwent muscle biopsy, pathology concerning for mitochondrial myopathy.  He is currently closely followed by [facility] neurology for chronic neurogenic atrophy and mitochondrial myopathy.  Patient has regained weight, increased his protein intake significantly as advised by his neurologist, his current BMI is 33.  Chronic GERD EGD has been normal in the past Advised to decrease omeprazole 40 mg to once daily Okay to stop sucralfate  History of adenomas of the colon Recommend surveillance colonoscopy in 04/2025  Occasional mucus drainage per rectum with fecal soiling, occurring about once every 1 to 2 months Likely secondary to hemorrhoids and in setting of intermittent constipation Discussed about Kegel exercises, information provided   Follow up as needed   Arlyss Repress, MD

## 2023-05-04 NOTE — Telephone Encounter (Signed)
The omeprazole 40mg 

## 2023-05-10 DIAGNOSIS — R531 Weakness: Secondary | ICD-10-CM | POA: Diagnosis not present

## 2023-05-10 DIAGNOSIS — G5603 Carpal tunnel syndrome, bilateral upper limbs: Secondary | ICD-10-CM | POA: Diagnosis not present

## 2023-05-10 NOTE — Progress Notes (Unsigned)
Name: [name] [name]   MRN: [id number]    DOB: 11-01-1961   Date:05/11/2023       Progress Note  Subjective  Chief Complaint  Follow Up  HPI  DMII: he is taking Farxiga , off Trulicity due to low glucose levels and also bloating that was affecting his ability to eat. He denies polyphagia, polydipsia or polyuria. A1C is at goal - 5.6 % , he was placed back on Crestor by Dr. Kirke Corin last week, tolerating it well, he takes ARB for kidney protection.   Mitochondrial disease   going to [facility] and seeing neuromuscular sub-specialist. He has neuromuscular atrophy and had significant weight loss due to muscle mass loss .  He also has dysphagia but stable now . Symptoms are stable, he has some tremors intermittently and still uses a cane    A. 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 See comment. - Mild, chronic neurogenic atrophy.    Constipation: he has been taking dulcolax and milk of magnesia prn, he states Amitiza did not work , eating more fiber and it has helped with symptoms    HTN/Angina : He is taking carvedilol and Ranexa, off Imdur due to hypotension, he takes Zetia and recently Dr. Kirke Corin added Rosuvastatin again. . Denies recent chest pain or palpitation lately but also not very physically active due to mitochondrial disease. Continue medication  Cardiac cath done 07/2021     Ost LAD to Mid LAD lesion is 10% stenosed.   Mid LAD lesion is 30% stenosed     SOB: going on since Fall 2021 - he has been evaluated by pulmonologist and cardiologist and no answers yet, cardiac cath unremarkable, also negative  CT chest. He was given Virgel Bouquet initially without help, he was told secondary to mitochondrial disease    Hyperlipidemia: compliant with medication, LDL at goal it was 169, he is back on statin and zetia now We will recheck labs   Seizure like activity  : still under the  care of Dr. Malvin Johns, still has tremors when standing up, had an episode of shaking at night and went back to  main [facility] epilepsy monitoring service. He was admitted to [facility] from 04/18 to 04/21 for EEG with video and it looked negative for seizure   Study from 03/2023: No nocturnal seizure like events identified--EEG was unremarkable and without interictal activity. Spells captured cannot be characterized, but there is no evidence of a forme pleine epileptic process.    Low B12: he is getting B12 injections  monthly in our office . Unchanged    OSA: he has a new CPAP machine and is compliant    Cervical left radiculopathy: seen by Dr. Alfredo Batty and PT advised since his SOB and recent health issues makes him a poor candidate for surgery at this time.He saw Dr. Edwinna Areola and was referred to pain medicine - Dr. Cherylann Ratel , but he decided not to go back Currently not taking medications for pain    Imaging: MRI C spine 08/26/2021 IMPRESSION:  1. Multilevel spondylosis of the cervical spine as described.  2. Moderate foraminal narrowing bilaterally at C2-3, right greater  than left.  3. Moderate right mild left foraminal narrowing at C3-4.  4. Moderate foraminal narrowing bilaterally at C5-6 and C6-7 is  worse on the right.  5. Mild central canal narrowing at C6-7.    Malnutrition: BMI is now down to 30, he  has lost about 50 lbs, his baseline weight was 262 lbs, he is trying to increase calorie intake and eating 5 times a day, weight is down a couple of pounds since last visit.    Enlarged thyroid: CT done 03/2021 at [facility], showed goiter and compression of trachea , TSH has been at goal . Unchanged   History of gastritis: seen by Dr. Allegra Lai, advised to continue omeprazole once a day, no longer on sucralfate. He is off Trulicity and bloating has improved .   Paresthesias of both hands : he is seeing neurologist and had EMG yesterday , discussed surgery but he is afraid to have it done   EMG  Conclusion/05/10/2023 This is an abnormal study. There is electrodiagnostic and ultrasonographic evidence of moderate left and mild right median neuropathy at the wrist (carpal tunnel syndrome). There is no electrodiagnostic evidence of left cervical radiculopathy.   Patient Active Problem List   Diagnosis Date Noted   Right groin pain 02/03/2023   Neurogenic muscular atrophy 05/08/2022   Other insomnia 05/08/2022   Controlled type 2 diabetes mellitus with microalbuminuria, without long-term current use of insulin (HCC) 05/08/2022   Contraindication to statin medication 05/08/2022   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HCC) 01/22/2022   Tremor 01/22/2022   Shortness of breath 01/22/2022   Muscle atrophy of lower extremity 11/10/2021   Dysphagia    Asthma 05/14/2021   Multiple thyroid nodules 05/14/2021   Localized, primary osteoarthritis of hand 09/28/2019   Seizure-like activity (HCC) 02/22/2018   Mild cognitive impairment 11/17/2017   Radiculopathy of cervical region 11/17/2017   Stenosis of carotid artery 09/08/2017   Cervical stenosis of spine 09/08/2017   B12 deficiency 06/28/2017   Headache disorder 02/15/2017   Memory loss or impairment 02/15/2017   Coronary artery disease involving native coronary artery of native heart    Radiculitis of left cervical region 03/05/2016   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) 12/05/2015   Patellar subluxation 06/05/2015   Allergic rhinitis, seasonal 06/05/2015   Chronic constipation 06/05/2015   Chronic lower back pain 06/05/2015   Vitamin D deficiency 06/05/2015   Central sleep apnea 06/05/2015   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   Nerve root pain 06/05/2015   Acquired polycythemia 06/05/2015   Anterior knee pain 06/05/2015   Failure of erection 06/05/2015   Gastro-esophageal reflux disease without esophagitis 06/05/2015   Neuropathy 06/05/2015   Angina, class III (HCC) 05/23/2015   Essential hypertension 05/23/2015    Hyperlipidemia 05/23/2015   Inguinal hernia without mention of obstruction or gangrene, recurrent unilateral or unspecified 03/24/2013    Past Surgical History:  Procedure Laterality Date   BACK SURGERY  12/2008   X2-LUMBAR   CARDIAC CATHETERIZATION N/A 05/30/2015   Procedure: Left Heart Cath;  Surgeon: Iran Ouch, MD;  Location: [facility];  Service: Cardiovascular;  Laterality: N/A;   CARDIAC CATHETERIZATION N/A 04/16/2016   Procedure: Left Heart Cath and Coronary Angiography;  Surgeon: Iran Ouch, MD;  Location: [facility];  Service: Cardiovascular;  Laterality: N/A;   COLONOSCOPY  2014   Dr. Evette Cristal   COLONOSCOPY WITH PROPOFOL N/A 12/25/2016   Procedure: COLONOSCOPY WITH PROPOFOL;  Surgeon: Wyline Mood, MD;  Location: [facility] ENDOSCOPY;  Service: Endoscopy;  Laterality: N/A;   COLONOSCOPY WITH PROPOFOL N/A 01/29/2017   Procedure: COLONOSCOPY WITH PROPOFOL;  Surgeon: Wyline Mood, MD;  Location: [facility] ENDOSCOPY;  Service: Endoscopy;  Laterality: N/A;   COLONOSCOPY WITH PROPOFOL N/A 04/11/2020   Procedure: COLONOSCOPY WITH PROPOFOL;  Surgeon: Wyline Mood, MD;  Location: [facility] ENDOSCOPY;  Service: Gastroenterology;  Laterality: N/A;   CORONARY PRESSURE/FFR STUDY N/A 07/30/2021   Procedure: INTRAVASCULAR PRESSURE WIRE/FFR STUDY;  Surgeon: Iran Ouch, MD;  Location: [facility];  Service: Cardiovascular;  Laterality: N/A;   ESOPHAGEAL MANOMETRY N/A 03/04/2022   Procedure: ESOPHAGEAL MANOMETRY (EM);  Surgeon: Napoleon Form, MD;  Location: [facility] ENDOSCOPY;  Service: Gastroenterology;  Laterality: N/A;   ESOPHAGOGASTRODUODENOSCOPY (EGD) WITH PROPOFOL N/A 01/26/2019   Procedure: ESOPHAGOGASTRODUODENOSCOPY (EGD) WITH PROPOFOL;  Surgeon: Toney Reil, MD;  Location[facility] ENDOSCOPY;  Service: Gastroenterology;  Laterality: N/A;   ESOPHAGOGASTRODUODENOSCOPY (EGD) WITH PROPOFOL N/A 09/10/2021   Procedure: ESOPHAGOGASTRODUODENOSCOPY (EGD) WITH PROPOFOL;   Surgeon: Toney Reil, MD;  Location: [facility] ENDOSCOPY;  Service: Gastroenterology;  Laterality: N/A;   EVALUATION UNDER ANESTHESIA WITH HEMORRHOIDECTOMY N/A 02/24/2017   Procedure: EXAM UNDER ANESTHESIA WITH POSSIBLE EXCISION OF INTERNAL HEMORRHOIDS;  Surgeon: Henrene Dodge, MD;  Location: [facility] ORS;  Service: General;  Laterality: N/A;   FISSURECTOMY  02/24/2017   Procedure: FISSURECTOMY;  Surgeon: Henrene Dodge, MD;  Location: [facility] OR[facility];  Service: General;;   HAND SURGERY Left 2020   HERNIA REPAIR Right 1991   INGUINAL HERNIA REPAIR Right 2012   Dr Sherral Hammers HERNIA REPAIR Right 2014   Dr Evette Cristal   LEFT HEART CATH AND CORONARY ANGIOGRAPHY Left 08/05/2020   Procedure: LEFT HEART CATH AND CORONARY ANGIOGRAPHY poss PCI;  Surgeon: Iran Ouch, MD;  Location: [facility];  Service: Cardiovascular;  Laterality: Left;   MUSCLE BIOPSY Left 12/31/2021   Procedure: LEFT VASTUS LATERALIS BIOPSY;  Surgeon: Venetia Night, MD;  Location: [facility] OR[facility];  Service: Neurosurgery;  Laterality: Left;   RIGHT/LEFT HEART CATH AND CORONARY ANGIOGRAPHY N/A 07/30/2021   Procedure: RIGHT/LEFT HEART CATH AND CORONARY ANGIOGRAPHY;  Surgeon: Iran Ouch, MD;  Location: [facility];  Service: Cardiovascular;  Laterality: N/A;   WRIST SURGERY Left    XI ROBOT ASSISTED DIAGNOSTIC LAPAROSCOPY Right 02/03/2023   Procedure: DIAGNOSTIC LAPAROSCOPY;  Surgeon: Henrene Dodge, MD;  Location: [facility] OR[facility];  Service: General;  Laterality: Right;    Family History  Problem Relation Age of Onset   Hypertension Mother    Heart Problems Mother    High Cholesterol Mother    Heart Problems Father 24       myocardial infarction   Mental illness Brother    Other Maternal Aunt        COVID   Lung cancer Maternal Aunt    Lung cancer Maternal Aunt    Cancer Maternal Uncle        unknown   Prostate cancer Maternal Uncle    Testicular cancer Paternal Uncle    Thyroid disease Daughter    Breast cancer  Cousin     Social History   Tobacco Use   Smoking status: Former    Types: Cigars    Start date: 12/28/2002    Quit date: 06/04/2005    Years since quitting: 17.9   Smokeless tobacco: Never   Tobacco comments:    quit 2006-smoked 1 cigar occ  Substance Use Topics   Alcohol use: Not Currently    Comment: has not had a drink in 8 months 02/2021     Current Outpatient Medications:    ACCU-CHEK GUIDE test strip, USE TO TEST BLOOD SUGAR THREE TIMES DAILY AS NEEDED, Disp: 100 strip, Rfl: 1   Accu-Chek Softclix Lancets lancets, TEST TWICE DAILY, Disp: 100  each, Rfl: 3   acetaminophen (TYLENOL) 500 MG tablet, Take 2 tablets (1,000 mg total) by mouth every 6 (six) hours as needed for mild pain., Disp: , Rfl:    Alpha Lipoic Acid 200 MG CAPS, Take 200 mg by mouth daily., Disp: , Rfl:    blood glucose meter kit and supplies, 1 each by Other route in the morning and at bedtime. Dispense based on patient and insurance preference. Use up to two  times daily. (FOR ICD-10 E10.9, E11.9)., Disp: 1 each, Rfl: 0   carvedilol (COREG) 6.25 MG tablet, TAKE 1 TABLET(6.25 MG) BY MOUTH TWICE DAILY, Disp: 180 tablet, Rfl: 1   Coenzyme Q10 (COQ-10) 100 MG CAPS, Take 100 mg by mouth daily., Disp: , Rfl:    dapagliflozin propanediol (FARXIGA) 10 MG TABS tablet, Take 10 mg by mouth daily., Disp: , Rfl:    ezetimibe (ZETIA) 10 MG tablet, Take 1 tablet (10 mg total) by mouth daily. (Patient taking differently: Take 10 mg by mouth every morning.), Disp: 90 tablet, Rfl: 3   fluticasone (FLONASE) 50 MCG/ACT nasal spray, Place 1 spray into both nostrils daily as needed for allergies or rhinitis., Disp: , Rfl:    fluticasone furoate-vilanterol (BREO ELLIPTA) 100-25 MCG/ACT AEPB, Inhale 1 puff into the lungs daily., Disp: 60 each, Rfl: 6   ketoconazole (NIZORAL) 2 % shampoo, Apply 1 Application topically 3 (three) times a week. Wash scalp/body 3 times a week as needed for alres, let sit 5 minutes before rinsing off, avoid eyes.  May use once monthly for maintenance. (Patient taking differently: Apply 1 Application topically 2 (two) times a week. Wash scalp/body 3 times a week as needed for alres, let sit 5 minutes before rinsing off, avoid eyes. May use once monthly for maintenance.), Disp: 120 mL, Rfl: 11   levOCARNitine (CARNITOR) 330 MG tablet, Take 330 mg by mouth 2 (two) times daily., Disp: , Rfl:    losartan-hydrochlorothiazide (HYZAAR) 50-12.5 MG tablet, TAKE 1 TABLET BY MOUTH DAILY, Disp: 90 tablet, Rfl: 2   mupirocin ointment (BACTROBAN) 2 %, Apply 1 application topically daily. Qd to excision site, Disp: 22 g, Rfl: 1   nitroGLYCERIN (NITROSTAT) 0.4 MG SL tablet, Place 1 tablet (0.4 mg total) under the tongue every 5 (five) minutes as needed for chest pain., Disp: , Rfl:    omeprazole (PRILOSEC) 40 MG capsule, TAKE 1 CAPSULE(40 MG) BY MOUTH TWICE DAILY BEFORE A MEAL, Disp: 180 capsule, Rfl: 0   ranolazine (RANEXA) 1000 MG SR tablet, TAKE 1 TABLET(1000 MG) BY MOUTH TWICE DAILY, Disp: 180 tablet, Rfl: 0   Riboflavin (B-2) 100 MG TABS, Take 100 mg by mouth daily., Disp: , Rfl:    rosuvastatin (CRESTOR) 10 MG tablet, Take 1 tablet (10 mg total) by mouth daily., Disp: 90 tablet, Rfl: 3   sildenafil (VIAGRA) 100 MG tablet, Take 1 tablet (100 mg total) by mouth daily as needed for erectile dysfunction., Disp: 30 tablet, Rfl: 0   temazepam (RESTORIL) 15 MG capsule, TAKE 1 CAPSULE(15 MG) BY MOUTH AT BEDTIME, Disp: 90 capsule, Rfl: 0   Vitamin D, Ergocalciferol, (DRISDOL) 1.25 MG (50000 UNIT) CAPS capsule, Take 1 capsule (50,000 Units total) by mouth every 7 (seven) days., Disp: 12 capsule, Rfl: 1  Allergies  Allergen Reactions   Gabapentin Other (See Comments)    Groggy-Mood Changes   Latex Rash   Penicillins Hives and Rash    Tolerated 1st generation cephalosporin (CEFAZOLIN) on 12/31/2021 with no documented ADRs.  PCN reaction causing immediate  rash, facial/tongue/throat swelling, SOB or lightheadedness with  hypotension: Yes PCN reaction causing severe rash involving mucus membranes or skin necrosis: No PCN reaction that required hospitalization No PCN reaction occurring within the last 10 years: No If all of the above answers are "NO", then may proceed with Cephalosporin use.     I personally reviewed active problem list, medication list, allergies, family history, social history, health maintenance with the patient/caregiver today.   ROS  Ten systems reviewed and is negative except as mentioned in HPI   Objective  Vitals:   05/11/23 0948  BP: 126/74  Pulse: 60  Resp: 16  SpO2: 95%  Weight: 234 lb (106.1 kg)  Height: 5\' 11"  (1.803 m)    Body mass index is 32.64 kg/m.  Physical Exam  Constitutional: Patient appears well-developed and well-nourished. Obese  No distress.  HEENT: head atraumatic, normocephalic, pupils equal and reactive to light, neck supple Cardiovascular: Normal rate, regular rhythm and normal heart sounds.  No murmur heard. No BLE edema. Pulmonary/Chest: Effort normal and breath sounds normal. No respiratory distress. Abdominal: Soft.  There is no tenderness. Muscular skeletal: muscle atrophy, using a cane and slow gait with wide base Psychiatric: Patient has a normal mood and affect. behavior is normal. Judgment and thought content normal.   Recent Results (from the past 2160 hour(s))  POCT HgB A1C     Status: None   Collection Time: 05/11/23  9:50 AM  Result Value Ref Range   Hemoglobin A1C 5.6 4.0 - 5.6 %   HbA1c POC (<> result, manual entry)     HbA1c, POC (prediabetic range)     HbA1c, POC (controlled diabetic range)       PHQ2/9:    05/11/2023    9:49 AM 04/21/2023   11:17 AM 02/08/2023    9:29 AM 12/31/2022   10:06 AM 11/04/2022   10:20 AM  Depression screen PHQ 2/9  Decreased Interest 0 0 0 0 0  Down, Depressed, Hopeless 0 0 0 0 0  PHQ - 2 Score 0 0 0 0 0  Altered sleeping 0 0 0 0 0  Tired, decreased energy 0 0 0 0 0  Change in appetite  0 0 0 0 0  Feeling bad or failure about yourself  0 0 0 0 0  Trouble concentrating 0 0 0 0 0  Moving slowly or fidgety/restless 0 0 0 0 0  Suicidal thoughts 0 0 0 0 0  PHQ-9 Score 0 0 0 0 0    phq 9 is negative   Fall Risk:    05/11/2023    9:49 AM 04/21/2023   11:16 AM 02/08/2023    9:29 AM 12/31/2022   10:05 AM 11/04/2022   10:20 AM  Fall Risk   Falls in the past year? 0 0 0 1 1  Number falls in past yr: 0 0 0 0 1  Injury with Fall? 0 0 0 0 0  Risk for fall due to : No Fall Risks Impaired balance/gait;No Fall Risks Impaired balance/gait Impaired balance/gait Impaired balance/gait  Follow up Falls prevention discussed Falls prevention discussed Falls prevention discussed Falls prevention discussed Falls prevention discussed      Functional Status Survey: Is the patient deaf or have difficulty hearing?: No Does the patient have difficulty seeing, even when wearing glasses/contacts?: No Does the patient have difficulty concentrating, remembering, or making decisions?: No Does the patient have difficulty walking or climbing stairs?: No Does the patient have difficulty dressing or bathing?:  No Does the patient have difficulty doing errands alone such as visiting a doctor's office or shopping?: No    Assessment & Plan  1. Controlled type 2 diabetes mellitus with microalbuminuria, without long-term current use of insulin (HCC)  - POCT HgB A1C  2. Vitamin D deficiency  - Vitamin D, Ergocalciferol, (DRISDOL) 1.25 MG (50000 UNIT) CAPS capsule; Take 1 capsule (50,000 Units total) by mouth every 7 (seven) days.  Dispense: 12 capsule; Refill: 1  3. Dyslipidemia associated with type 2 diabetes mellitus (HCC)  - Lipid Profile - COMPLETE METABOLIC PANEL WITH GFR  4. B12 deficiency  Getting B12 injections  5. Seizure-like activity (HCC)  Under the care of neurologist   6. Angina, class III (HCC)  Stable on statin and zetia   7. 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HCC)  Continue  visits at [facility]   8. Gastro-esophageal reflux disease without esophagitis  Stable   9. Bilateral carpal tunnel syndrome  Thinking about surgery

## 2023-05-11 ENCOUNTER — Encounter: Payer: Self-pay | Admitting: Family Medicine

## 2023-05-11 ENCOUNTER — Ambulatory Visit: Payer: Medicaid Other | Admitting: Family Medicine

## 2023-05-11 VITALS — BP 126/74 | HR 60 | Resp 16 | Ht 71.0 in | Wt 234.0 lb

## 2023-05-11 DIAGNOSIS — E785 Hyperlipidemia, unspecified: Secondary | ICD-10-CM | POA: Diagnosis not present

## 2023-05-11 DIAGNOSIS — Z7984 Long term (current) use of oral hypoglycemic drugs: Secondary | ICD-10-CM | POA: Diagnosis not present

## 2023-05-11 DIAGNOSIS — E1169 Type 2 diabetes mellitus with other specified complication: Secondary | ICD-10-CM

## 2023-05-11 DIAGNOSIS — R809 Proteinuria, unspecified: Secondary | ICD-10-CM | POA: Diagnosis not present

## 2023-05-11 DIAGNOSIS — R569 Unspecified convulsions: Secondary | ICD-10-CM

## 2023-05-11 DIAGNOSIS — I209 Angina pectoris, unspecified: Secondary | ICD-10-CM

## 2023-05-11 DIAGNOSIS — G5603 Carpal tunnel syndrome, bilateral upper limbs: Secondary | ICD-10-CM

## 2023-05-11 DIAGNOSIS — E559 Vitamin D deficiency, unspecified: Secondary | ICD-10-CM | POA: Diagnosis not present

## 2023-05-11 DIAGNOSIS — E1129 Type 2 diabetes mellitus with other diabetic kidney complication: Secondary | ICD-10-CM

## 2023-05-11 DIAGNOSIS — K219 Gastro-esophageal reflux disease without esophagitis: Secondary | ICD-10-CM

## 2023-05-11 DIAGNOSIS — E538 Deficiency of other specified B group vitamins: Secondary | ICD-10-CM | POA: Diagnosis not present

## 2023-05-11 DIAGNOSIS — E8840 Mitochondrial metabolism disorder, unspecified: Secondary | ICD-10-CM

## 2023-05-11 LAB — COMPLETE METABOLIC PANEL WITH GFR
AG Ratio: 1.6 (calc) (ref 1.0–2.5)
ALT: 15 U/L (ref 9–46)
AST: 12 U/L (ref 10–35)
Albumin: 4.4 g/dL (ref 3.6–5.1)
Alkaline phosphatase (APISO): 66 U/L (ref 35–144)
BUN: 11 mg/dL (ref 7–25)
CO2: 32 mmol/L (ref 20–32)
Calcium: 9.5 mg/dL (ref 8.6–10.3)
Chloride: 101 mmol/L (ref 98–110)
Creat: 1.04 mg/dL (ref 0.70–1.35)
Globulin: 2.8 g/dL (calc) (ref 1.9–3.7)
Glucose, Bld: 112 mg/dL — ABNORMAL HIGH (ref 65–99)
Potassium: 4.1 mmol/L (ref 3.5–5.3)
Sodium: 139 mmol/L (ref 135–146)
Total Bilirubin: 0.7 mg/dL (ref 0.2–1.2)
Total Protein: 7.2 g/dL (ref 6.1–8.1)
eGFR: 82 mL/min/{1.73_m2} (ref >=60)

## 2023-05-11 LAB — LIPID PANEL
Cholesterol: 127 mg/dL (ref <200)
HDL: 45 mg/dL (ref >=40)
LDL Cholesterol (Calc): 66 mg/dL (calc)
Non-HDL Cholesterol (Calc): 82 mg/dL (calc) (ref <130)
Total CHOL/HDL Ratio: 2.8 (calc) (ref <5.0)
Triglycerides: 75 mg/dL (ref <150)

## 2023-05-11 LAB — POCT GLYCOSYLATED HEMOGLOBIN (HGB A1C): Hemoglobin A1C: 5.6 % (ref 4.0–5.6)

## 2023-05-11 MED ORDER — VITAMIN D (ERGOCALCIFEROL) 1.25 MG (50000 UNIT) PO CAPS
50000.0000 [IU] | ORAL_CAPSULE | ORAL | 1 refills | Status: AC
Start: 2023-05-11 — End: ?

## 2023-05-20 DIAGNOSIS — G5603 Carpal tunnel syndrome, bilateral upper limbs: Secondary | ICD-10-CM | POA: Diagnosis not present

## 2023-05-31 LAB — HM DIABETES EYE EXAM

## 2023-06-11 DIAGNOSIS — G5603 Carpal tunnel syndrome, bilateral upper limbs: Secondary | ICD-10-CM | POA: Diagnosis not present

## 2023-06-11 DIAGNOSIS — E119 Type 2 diabetes mellitus without complications: Secondary | ICD-10-CM | POA: Diagnosis not present

## 2023-06-11 DIAGNOSIS — Z01818 Encounter for other preprocedural examination: Secondary | ICD-10-CM | POA: Diagnosis not present

## 2023-06-17 ENCOUNTER — Other Ambulatory Visit: Payer: Self-pay | Admitting: Family Medicine

## 2023-06-17 ENCOUNTER — Encounter: Payer: Self-pay | Admitting: Family Medicine

## 2023-06-17 DIAGNOSIS — N529 Male erectile dysfunction, unspecified: Secondary | ICD-10-CM

## 2023-06-17 MED ORDER — SILDENAFIL CITRATE 100 MG PO TABS
100.0000 mg | ORAL_TABLET | Freq: Every day | ORAL | 0 refills | Status: DC | PRN
Start: 2023-06-17 — End: 2023-10-17

## 2023-06-19 ENCOUNTER — Other Ambulatory Visit: Payer: Self-pay | Admitting: Family Medicine

## 2023-06-19 DIAGNOSIS — N529 Male erectile dysfunction, unspecified: Secondary | ICD-10-CM

## 2023-06-24 ENCOUNTER — Other Ambulatory Visit: Payer: Self-pay | Admitting: Pulmonary Disease

## 2023-06-28 ENCOUNTER — Ambulatory Visit (INDEPENDENT_AMBULATORY_CARE_PROVIDER_SITE_OTHER): Payer: Medicaid Other

## 2023-06-28 ENCOUNTER — Other Ambulatory Visit: Payer: Self-pay

## 2023-06-28 DIAGNOSIS — E538 Deficiency of other specified B group vitamins: Secondary | ICD-10-CM | POA: Diagnosis not present

## 2023-06-28 DIAGNOSIS — E1129 Type 2 diabetes mellitus with other diabetic kidney complication: Secondary | ICD-10-CM

## 2023-06-28 MED ORDER — CYANOCOBALAMIN 1000 MCG/ML IJ SOLN
1000.0000 ug | Freq: Once | INTRAMUSCULAR | Status: AC
Start: 2023-06-28 — End: 2023-06-28
  Administered 2023-06-28: 1000 ug via INTRAMUSCULAR

## 2023-06-28 NOTE — Telephone Encounter (Signed)
Appt made 07-12-2023

## 2023-06-28 NOTE — Progress Notes (Signed)
Patient presented to clinic in expressed good health for B-12 injection. Patient tolerated injection well with no immediate side effects noted at time of clinic departure.

## 2023-07-09 NOTE — Progress Notes (Deleted)
Name: [name] [name]   MRN: [id number]    DOB: Nov 30, 1961   Date:07/09/2023       Progress Note  Subjective  Chief Complaint  Elevated Glucose  HPI  *** Patient Active Problem List   Diagnosis Date Noted   Bilateral carpal tunnel syndrome 05/11/2023   Dyslipidemia associated with type 2 diabetes mellitus (HCC) 05/11/2023   Right groin pain 02/03/2023   Neurogenic muscular atrophy 05/08/2022   Other insomnia 05/08/2022   Controlled type 2 diabetes mellitus with microalbuminuria, without long-term current use of insulin (HCC) 05/08/2022   Contraindication to statin medication 05/08/2022   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HCC) 01/22/2022   Tremor 01/22/2022   Shortness of breath 01/22/2022   Muscle atrophy of lower extremity 11/10/2021   Dysphagia    Asthma 05/14/2021   Multiple thyroid nodules 05/14/2021   Localized, primary osteoarthritis of hand 09/28/2019   Seizure-like activity (HCC) 02/22/2018   Mild cognitive impairment 11/17/2017   Radiculopathy of cervical region 11/17/2017   Stenosis of carotid artery 09/08/2017   Cervical stenosis of spine 09/08/2017   B12 deficiency 06/28/2017   Headache disorder 02/15/2017   Memory loss or impairment 02/15/2017   Coronary artery disease involving native coronary artery of native heart    Radiculitis of left cervical region 03/05/2016   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) 12/05/2015   Patellar subluxation 06/05/2015   Allergic rhinitis, seasonal 06/05/2015   Chronic constipation 06/05/2015   Chronic lower back pain 06/05/2015   Vitamin D deficiency 06/05/2015   Central sleep apnea 06/05/2015   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   Nerve root pain 06/05/2015   Acquired polycythemia 06/05/2015   Anterior knee pain 06/05/2015   Failure of erection 06/05/2015   Gastro-esophageal reflux disease without esophagitis 06/05/2015   Neuropathy 06/05/2015   Angina, class III (HCC) 05/23/2015   Essential hypertension  05/23/2015   Hyperlipidemia 05/23/2015   Inguinal hernia without mention of obstruction or gangrene, recurrent unilateral or unspecified 03/24/2013    Past Surgical History:  Procedure Laterality Date   BACK SURGERY  12/2008   X2-LUMBAR   CARDIAC CATHETERIZATION N/A 05/30/2015   Procedure: Left Heart Cath;  Surgeon: Iran Ouch, MD;  Location: [facility];  Service: Cardiovascular;  Laterality: N/A;   CARDIAC CATHETERIZATION N/A 04/16/2016   Procedure: Left Heart Cath and Coronary Angiography;  Surgeon: Iran Ouch, MD;  Location: [facility];  Service: Cardiovascular;  Laterality: N/A;   COLONOSCOPY  2014   Dr. Evette Cristal   COLONOSCOPY WITH PROPOFOL N/A 12/25/2016   Procedure: COLONOSCOPY WITH PROPOFOL;  Surgeon: Wyline Mood, MD;  Location: [facility];  Service: Endoscopy;  Laterality: N/A;   COLONOSCOPY WITH PROPOFOL N/A 01/29/2017   Procedure: COLONOSCOPY WITH PROPOFOL;  Surgeon: Wyline Mood, MD;  Location: [facility];  Service: Endoscopy;  Laterality: N/A;   COLONOSCOPY WITH PROPOFOL N/A 04/11/2020   Procedure: COLONOSCOPY WITH PROPOFOL;  Surgeon: Wyline Mood, MD;  Location: [facility] ENDOSCOPY;  Service: Gastroenterology;  Laterality: N/A;   CORONARY PRESSURE/FFR STUDY N/A 07/30/2021   Procedure: INTRAVASCULAR PRESSURE WIRE/FFR STUDY;  Surgeon: Iran Ouch, MD;  Location: [facility];  Service: Cardiovascular;  Laterality: N/A;   ESOPHAGEAL MANOMETRY N/A 03/04/2022   Procedure: ESOPHAGEAL MANOMETRY (EM);  Surgeon: Napoleon Form, MD;  Location: [facility];  Service: Gastroenterology;  Laterality: N/A;   ESOPHAGOGASTRODUODENOSCOPY (EGD) WITH PROPOFOL N/A 01/26/2019   Procedure: ESOPHAGOGASTRODUODENOSCOPY (EGD) WITH PROPOFOL;  Surgeon: Toney Reil, MD;  Location: [facility];  Service: Gastroenterology;  Laterality: N/A;   ESOPHAGOGASTRODUODENOSCOPY (EGD) WITH PROPOFOL N/A 09/10/2021   Procedure: ESOPHAGOGASTRODUODENOSCOPY (EGD)  WITH PROPOFOL;  Surgeon: Toney Reil, MD;  Location: [facility] ENDOSCOPY;  Service: Gastroenterology;  Laterality: N/A;   EVALUATION UNDER ANESTHESIA WITH HEMORRHOIDECTOMY N/A 02/24/2017   Procedure: EXAM UNDER ANESTHESIA WITH POSSIBLE EXCISION OF INTERNAL HEMORRHOIDS;  Surgeon: Henrene Dodge, MD;  Location: [facility];  Service: General;  Laterality: N/A;   FISSURECTOMY  02/24/2017   Procedure: FISSURECTOMY;  Surgeon: Henrene Dodge, MD;  Location: [facility];  Service: General;;   HAND SURGERY Left 2020   HERNIA REPAIR Right 1991   INGUINAL HERNIA REPAIR Right 2012   Dr Sherral Hammers HERNIA REPAIR Right 2014   Dr Evette Cristal   LEFT HEART CATH AND CORONARY ANGIOGRAPHY Left 08/05/2020   Procedure: LEFT HEART CATH AND CORONARY ANGIOGRAPHY poss PCI;  Surgeon: Iran Ouch, MD;  Location: [facility];  Service: Cardiovascular;  Laterality: Left;   MUSCLE BIOPSY Left 12/31/2021   Procedure: LEFT VASTUS LATERALIS BIOPSY;  Surgeon: Venetia Night, MD;  Location: [facility];  Service: Neurosurgery;  Laterality: Left;   RIGHT/LEFT HEART CATH AND CORONARY ANGIOGRAPHY N/A 07/30/2021   Procedure: RIGHT/LEFT HEART CATH AND CORONARY ANGIOGRAPHY;  Surgeon: Iran Ouch, MD;  Location: [facility];  Service: Cardiovascular;  Laterality: N/A;   WRIST SURGERY Left    XI ROBOT ASSISTED DIAGNOSTIC LAPAROSCOPY Right 02/03/2023   Procedure: DIAGNOSTIC LAPAROSCOPY;  Surgeon: Henrene Dodge, MD;  Location: [facility];  Service: General;  Laterality: Right;    Family History  Problem Relation Age of Onset   Hypertension Mother    Heart Problems Mother    High Cholesterol Mother    Heart Problems Father 7       myocardial infarction   Mental illness Brother    Other Maternal Aunt        COVID   Lung cancer Maternal Aunt    Lung cancer Maternal Aunt    Cancer Maternal Uncle        unknown   Prostate cancer Maternal Uncle    Testicular cancer Paternal Uncle    Thyroid disease Daughter     Breast cancer Cousin     Social History   Tobacco Use   Smoking status: Former    Types: Cigars    Start date: 12/28/2002    Quit date: 06/04/2005    Years since quitting: 18.1   Smokeless tobacco: Never   Tobacco comments:    quit 2006-smoked 1 cigar occ  Substance Use Topics   Alcohol use: Not Currently    Comment: has not had a drink in 8 months 02/2021     Current Outpatient Medications:    ACCU-CHEK GUIDE test strip, USE TO TEST BLOOD SUGAR THREE TIMES DAILY AS NEEDED, Disp: 100 strip, Rfl: 1   Accu-Chek Softclix Lancets lancets, TEST TWICE DAILY, Disp: 100 each, Rfl: 3   acetaminophen (TYLENOL) 500 MG tablet, Take 2 tablets (1,000 mg total) by mouth every 6 (six) hours as needed for mild pain., Disp: , Rfl:    Alpha Lipoic Acid 200 MG CAPS, Take 200 mg by mouth daily., Disp: , Rfl:    aspirin EC 81 MG tablet, Take by mouth., Disp: , Rfl:    blood glucose meter kit and supplies, 1 each by Other route in the morning and at bedtime. Dispense based on patient and insurance preference. Use up to two  times daily. (FOR ICD-10 E10.9, E11.9).,  Disp: 1 each, Rfl: 0   carvedilol (COREG) 6.25 MG tablet, TAKE 1 TABLET(6.25 MG) BY MOUTH TWICE DAILY, Disp: 180 tablet, Rfl: 1   Coenzyme Q10 (COQ-10) 100 MG CAPS, Take 100 mg by mouth daily., Disp: , Rfl:    dapagliflozin propanediol (FARXIGA) 10 MG TABS tablet, Take 10 mg by mouth daily., Disp: , Rfl:    docusate sodium (COLACE) 50 MG capsule, Take by mouth., Disp: , Rfl:    Dulaglutide (TRULICITY) 1.5 MG/0.5ML SOPN, Inject into the skin., Disp: , Rfl:    ezetimibe (ZETIA) 10 MG tablet, Take 1 tablet (10 mg total) by mouth daily. (Patient taking differently: Take 10 mg by mouth every morning.), Disp: 90 tablet, Rfl: 3   fluticasone (FLONASE) 50 MCG/ACT nasal spray, Place 1 spray into both nostrils daily as needed for allergies or rhinitis., Disp: , Rfl:    fluticasone furoate-vilanterol (BREO ELLIPTA) 100-25 MCG/ACT AEPB, INHALE 1 PUFF INTO THE  LUNGS DAILY, Disp: 60 each, Rfl: 6   ibuprofen (ADVIL) 800 MG tablet, Take 800 mg by mouth every 6 (six) hours as needed., Disp: , Rfl:    ketoconazole (NIZORAL) 2 % shampoo, Apply 1 Application topically 3 (three) times a week. Wash scalp/body 3 times a week as needed for alres, let sit 5 minutes before rinsing off, avoid eyes. May use once monthly for maintenance. (Patient taking differently: Apply 1 Application topically 2 (two) times a week. Wash scalp/body 3 times a week as needed for alres, let sit 5 minutes before rinsing off, avoid eyes. May use once monthly for maintenance.), Disp: 120 mL, Rfl: 11   levOCARNitine (CARNITOR) 330 MG tablet, Take 330 mg by mouth 2 (two) times daily., Disp: , Rfl:    losartan-hydrochlorothiazide (HYZAAR) 50-12.5 MG tablet, TAKE 1 TABLET BY MOUTH DAILY, Disp: 90 tablet, Rfl: 2   mupirocin ointment (BACTROBAN) 2 %, Apply 1 application topically daily. Qd to excision site, Disp: 22 g, Rfl: 1   nitroGLYCERIN (NITROSTAT) 0.4 MG SL tablet, Place 1 tablet (0.4 mg total) under the tongue every 5 (five) minutes as needed for chest pain., Disp: , Rfl:    omeprazole (PRILOSEC) 40 MG capsule, TAKE 1 CAPSULE(40 MG) BY MOUTH TWICE DAILY BEFORE A MEAL, Disp: 180 capsule, Rfl: 0   ranolazine (RANEXA) 1000 MG SR tablet, TAKE 1 TABLET(1000 MG) BY MOUTH TWICE DAILY, Disp: 180 tablet, Rfl: 0   Riboflavin (B-2) 100 MG TABS, Take 100 mg by mouth daily., Disp: , Rfl:    rosuvastatin (CRESTOR) 10 MG tablet, Take 1 tablet (10 mg total) by mouth daily., Disp: 90 tablet, Rfl: 3   sildenafil (VIAGRA) 100 MG tablet, Take 1 tablet (100 mg total) by mouth daily as needed for erectile dysfunction., Disp: 30 tablet, Rfl: 0   temazepam (RESTORIL) 15 MG capsule, TAKE 1 CAPSULE(15 MG) BY MOUTH AT BEDTIME, Disp: 90 capsule, Rfl: 0   Vitamin D, Ergocalciferol, (DRISDOL) 1.25 MG (50000 UNIT) CAPS capsule, Take 1 capsule (50,000 Units total) by mouth every 7 (seven) days., Disp: 12 capsule, Rfl:  1  Allergies  Allergen Reactions   Gabapentin Other (See Comments)    Groggy-Mood Changes   Latex Rash   Penicillins Hives and Rash    Tolerated 1st generation cephalosporin (CEFAZOLIN) on 12/31/2021 with no documented ADRs.  PCN reaction causing immediate rash, facial/tongue/throat swelling, SOB or lightheadedness with hypotension: Yes PCN reaction causing severe rash involving mucus membranes or skin necrosis: No PCN reaction that required hospitalization No PCN reaction occurring within the last  10 years: No If all of the above answers are "NO", then may proceed with Cephalosporin use.     I personally reviewed active problem list, medication list, allergies, family history, social history, health maintenance with the patient/caregiver today.   ROS  ***  Objective  There were no vitals filed for this visit.  There is no height or weight on file to calculate BMI.  Physical Exam ***   PHQ2/9:    05/11/2023    9:49 AM 04/21/2023   11:17 AM 02/08/2023    9:29 AM 12/31/2022   10:06 AM 11/04/2022   10:20 AM  Depression screen PHQ 2/9  Decreased Interest 0 0 0 0 0  Down, Depressed, Hopeless 0 0 0 0 0  PHQ - 2 Score 0 0 0 0 0  Altered sleeping 0 0 0 0 0  Tired, decreased energy 0 0 0 0 0  Change in appetite 0 0 0 0 0  Feeling bad or failure about yourself  0 0 0 0 0  Trouble concentrating 0 0 0 0 0  Moving slowly or fidgety/restless 0 0 0 0 0  Suicidal thoughts 0 0 0 0 0  PHQ-9 Score 0 0 0 0 0    phq 9 is {gen pos [id number]}   Fall Risk:    05/11/2023    9:49 AM 04/21/2023   11:16 AM 02/08/2023    9:29 AM 12/31/2022   10:05 AM 11/04/2022   10:20 AM  Fall Risk   Falls in the past year? 0 0 0 1 1  Number falls in past yr: 0 0 0 0 1  Injury with Fall? 0 0 0 0 0  Risk for fall due to : No Fall Risks Impaired balance/gait;No Fall Risks Impaired balance/gait Impaired balance/gait Impaired balance/gait  Follow up Falls prevention discussed Falls prevention discussed  Falls prevention discussed Falls prevention discussed Falls prevention discussed      Functional Status Survey:      Assessment & Plan  *** There are no diagnoses linked to this encounter.

## 2023-07-12 ENCOUNTER — Ambulatory Visit: Payer: Medicaid Other | Admitting: Family Medicine

## 2023-07-20 ENCOUNTER — Other Ambulatory Visit: Payer: Self-pay

## 2023-07-20 MED ORDER — LOSARTAN POTASSIUM-HCTZ 50-12.5 MG PO TABS: 1.0000 | ORAL_TABLET | Freq: Every day | ORAL | 0 refills | Status: DC

## 2023-07-26 ENCOUNTER — Ambulatory Visit (INDEPENDENT_AMBULATORY_CARE_PROVIDER_SITE_OTHER): Payer: Medicaid Other

## 2023-07-26 DIAGNOSIS — E538 Deficiency of other specified B group vitamins: Secondary | ICD-10-CM

## 2023-07-26 MED ORDER — CYANOCOBALAMIN 1000 MCG/ML IJ SOLN
1000.0000 ug | Freq: Once | INTRAMUSCULAR | Status: AC
Start: 2023-07-26 — End: 2023-07-26
  Administered 2023-07-26: 1000 ug via INTRAMUSCULAR

## 2023-07-27 ENCOUNTER — Other Ambulatory Visit: Payer: Self-pay | Admitting: Family Medicine

## 2023-07-27 DIAGNOSIS — E1129 Type 2 diabetes mellitus with other diabetic kidney complication: Secondary | ICD-10-CM

## 2023-07-28 ENCOUNTER — Encounter: Payer: Self-pay | Admitting: Family Medicine

## 2023-08-04 ENCOUNTER — Ambulatory Visit: Payer: Medicaid Other | Admitting: Dermatology

## 2023-08-04 VITALS — BP 155/93 | HR 57

## 2023-08-04 DIAGNOSIS — B360 Pityriasis versicolor: Secondary | ICD-10-CM | POA: Diagnosis not present

## 2023-08-04 DIAGNOSIS — L739 Follicular disorder, unspecified: Secondary | ICD-10-CM

## 2023-08-04 DIAGNOSIS — L814 Other melanin hyperpigmentation: Secondary | ICD-10-CM

## 2023-08-04 DIAGNOSIS — Z7189 Other specified counseling: Secondary | ICD-10-CM

## 2023-08-04 DIAGNOSIS — L700 Acne vulgaris: Secondary | ICD-10-CM | POA: Diagnosis not present

## 2023-08-04 DIAGNOSIS — L810 Postinflammatory hyperpigmentation: Secondary | ICD-10-CM

## 2023-08-04 DIAGNOSIS — Z79899 Other long term (current) drug therapy: Secondary | ICD-10-CM

## 2023-08-04 MED ORDER — SULFACETAMIDE SODIUM 9.8 % EX SHAM: MEDICATED_SHAMPOO | CUTANEOUS | 11 refills | Status: DC

## 2023-08-04 MED ORDER — HYDROCORTISONE 2.5 % EX CREA: TOPICAL_CREAM | CUTANEOUS | 3 refills | Status: DC

## 2023-08-04 MED ORDER — KETOCONAZOLE 2 % EX SHAM
MEDICATED_SHAMPOO | CUTANEOUS | 11 refills | Status: DC
Start: 2023-08-04 — End: 2024-02-22

## 2023-08-04 MED ORDER — FLUCONAZOLE 200 MG PO TABS: ORAL_TABLET | ORAL | 0 refills | Status: DC

## 2023-08-04 NOTE — Patient Instructions (Addendum)
Instructions for [facility] Medications  One or more of your medications was sent to the [facility] mail order compounding pharmacy. You will receive an email from them and can purchase the medicine through that link. It will then be mailed to your home at the address you confirmed. If for any reason you do not receive an email from them, please check your spam folder. If you still do not find the email, please let us know. [facility] phone number is [phone].     Due to recent changes in healthcare laws, you may see results of your pathology and/or laboratory studies on MyChart before the doctors have had a chance to review them. We understand that in some cases there may be results that are confusing or concerning to you. Please understand that not all results are received at the same time and often the doctors may need to interpret multiple results in order to provide you with the best plan of care or course of treatment. Therefore, we ask that you please give [place] 2 business days to thoroughly review all your results before contacting the office for clarification. Should we see a critical lab result, you will be contacted sooner.   If You Need Anything After Your Visit  If you have any questions or concerns for your doctor, please call our main line at [phone] and press option 4 to reach your doctor's medical assistant. If no one answers, please leave a voicemail as directed and we will return your call as soon as possible. Messages left after 4 pm will be answered the following business day.   You may also send [place] a message via MyChart. We typically respond to MyChart messages within 1-2 business days.  For prescription refills, please ask your pharmacy to contact our office. Our fax number is [phone].  If you have an urgent issue when the clinic is closed that cannot wait until the next business day, you can page your doctor at the number below.    Please note that  while we do our best to be available for urgent issues outside of office hours, we are not available 24/7.   If you have an urgent issue and are unable to reach [place], you may choose to seek medical care at your doctor's office, retail clinic, urgent care center, or emergency room.  If you have a medical emergency, please immediately call 911 or go to the emergency department.  Pager Numbers  - Dr. Gwen Pounds: [phone]  - Dr. Roseanne Reno: [phone]  In the event of inclement weather, please call our main line at [phone] for an update on the status of any delays or closures.  Dermatology Medication Tips: Please keep the boxes that topical medications come in in order to help keep track of the instructions about where and how to use these. Pharmacies typically print the medication instructions only on the boxes and not directly on the medication tubes.   If your medication is too expensive, please contact our office at [phone] option 4 or send [place] a message through MyChart.   We are unable to tell what your co-pay for medications will be in advance as this is different depending on your insurance coverage. However, we may be able to find a substitute medication at lower cost or fill out paperwork to get insurance to cover a needed medication.   If a prior authorization is required to get your medication covered by your insurance company, please allow [place] 1-2 business days  to complete this process.  Drug prices often vary depending on where the prescription is filled and some pharmacies may offer cheaper prices.  The website www.goodrx.com contains coupons for medications through different pharmacies. The prices here do not account for what the cost may be with help from insurance (it may be cheaper with your insurance), but the website can give you the price if you did not use any insurance.  - You can print the associated coupon and take it with your prescription to the pharmacy.  -  You may also stop by our office during regular business hours and pick up a GoodRx coupon card.  - If you need your prescription sent electronically to a different pharmacy, notify our office through [facility] or by phone at [phone] option 4.     Si Usted Necesita Algo Despus de Su Visita  Tambin puede enviarnos un mensaje a travs de Clinical cytogeneticist. Por lo general respondemos a los mensajes de MyChart en el transcurso de 1 a 2 das hbiles.  Para renovar recetas, por favor pida a su farmacia que se ponga en contacto con nuestra oficina. Annie Sable de fax es [place] [phone].  Si tiene un asunto urgente cuando la clnica est cerrada y que no puede esperar hasta el siguiente da hbil, puede llamar/localizar a su doctor(a) al nmero que aparece a continuacin.   Por favor, tenga en cuenta que aunque hacemos todo lo posible para estar disponibles para asuntos urgentes fuera del horario de [place], no estamos disponibles las 24 horas del da, los 7 [address]  [address] de la [place].   Si tiene un problema urgente y no puede comunicarse con nosotros, puede optar por buscar atencin mdica  en el consultorio de su doctor(a), en una clnica privada, en un centro de atencin urgente o en una sala de emergencias.  Si tiene Engineer, drilling, por favor llame inmediatamente al 911 o vaya a la sala de emergencias.  Nmeros de bper  - Dr. Gwen Pounds: [phone]  - Dra. Roseanne Reno: [phone]  En caso de inclemencias del [place], por favor llame a Lacy Duverney principal al [phone] para una actualizacin sobre el [place] de cualquier retraso o cierre.  Consejos para la medicacin en dermatologa: Por favor, guarde las cajas en las que vienen los medicamentos de uso tpico para ayudarle a seguir las instrucciones sobre dnde y cmo usarlos. Las farmacias generalmente imprimen las instrucciones del medicamento slo en las cajas y no directamente en los tubos del [place].   Si su medicamento  es muy caro, por favor, pngase en contacto con Rolm Gala llamando al [phone] y presione la opcin 4 o envenos un mensaje a travs de Clinical cytogeneticist.   No podemos decirle cul ser su copago por los medicamentos por adelantado ya que esto es diferente dependiendo de la cobertura de su seguro. Sin embargo, es posible que podamos encontrar un medicamento sustituto a Audiological scientist un formulario para que el seguro cubra el medicamento que se considera necesario.   Si se requiere una autorizacin previa para que su compaa de seguros [place] su medicamento, por favor permtanos de 1 a 2 das hbiles para completar [address]  Los precios de los medicamentos varan con frecuencia dependiendo del Environmental consultant de dnde se surte la receta y alguna farmacias pueden ofrecer precios ms baratos.  El sitio web www.goodrx.com tiene cupones para medicamentos de Health and safety inspector. Los precios aqu no tienen en cuenta lo que podra costar con la ayuda del seguro (puede ser ms  barato con su seguro), pero el sitio web puede darle el precio si no Visual merchandiser.  - Puede imprimir el cupn correspondiente y llevarlo con su receta a la farmacia.  - Tambin puede pasar por nuestra oficina durante el horario de atencin regular y Education officer, museum una tarjeta de cupones de GoodRx.  - Si necesita que su receta se enve electrnicamente a una farmacia diferente, informe a nuestra oficina a travs de MyChart de [place] o por telfono llamando al [phone] y presione la opcin 4.

## 2023-08-04 NOTE — Progress Notes (Signed)
   Follow-Up Visit   Subjective  [name] [name] is a 62 y.o. male who presents for the following: Currently using Selsium shampoo, Ketoconazole 2% shampoo, and Selenium sulfide lotion for tinea versicolor. Pt states that condition is well controlled on his back, but has started to flare on his face, and he was wondering if he could use the Ketoconazole 2% shampoo on his face as well. Pt c/o acne on the back and he would like to discuss treatment options.   The patient has spots, moles and lesions to be evaluated, some may be new or changing and the patient may have concern these could be cancer.  The following portions of the chart were reviewed this encounter and updated as appropriate: medications, allergies, medical history  Review of Systems:  No other skin or systemic complaints except as noted in HPI or Assessment and Plan.  Objective  Well appearing patient in no apparent distress; mood and affect are within normal limits. A focused examination was performed of the following areas: the face and trunk Relevant exam findings are noted in the Assessment and Plan.   Assessment & Plan              Tinea Versicolor Chronic and persistent condition with duration or expected duration over one year. Condition is improving with treatment but not currently at goal.  Use ketoconazole shampoo as body wash every other day on [place], Thursday and Saturday, especially in summer months.  D/C Selenium sulfide lotion.   Start [facility] sulfa wash - SM21 Sulfacetamide Sodium 9% / Sulfur 3% Foaming Wash every other day on Monday, Wednesday, and Friday.  Start Diflucan 200 mg po QW #40RF.    Tinea versicolor is a chronic recurrent skin rash causing discolored scaly spots most commonly seen on back, chest, and/or shoulders.  It is generally asymptomatic. The rash is due to overgrowth of a common type of yeast present on everyone's skin and it is not contagious.  It tends to flare more  in the summer due to increased sweating on trunk.  After rash is treated, the scaliness will resolve, but the discoloration will take longer to return to normal pigmentation. The periodic use of an OTC medicated soap/shampoo with zinc or selenium sulfide can be helpful to prevent yeast overgrowth and recurrence.  Tinea versicolor  Related Medications ketoconazole (NIZORAL) 2 % shampoo Wash scalp/body/face QD on Tuesday, Thursday, and Saturday, let sit 5 minutes before rinsing off, avoid eyes.  Hyperpigmentation of the face  2ndary to eczema vs Seb Derm Start HC 2.5% cream QHS on M,W, F PRN.   ACNE VULGARIS Exam: Open comedones and inflammatory papules on the shoulders.  Chronic and persistent condition with duration or expected duration over one year. Condition is symptomatic/ bothersome to patient. Not currently at goal.  Treatment Plan: Start [facility] sulfa wash 3d/wk.  Continue Ketoconazole 2% shampoo 3d/wk alternating with [facility] sulfa wash - SM21 Sulfacetamide Sodium 9% / Sulfur 3% Foaming Wash  Return in about 6 months (around 02/04/2024) for tinea versicolor and acne follow up.  Maylene Roes, CMA, am acting as scribe for Armida Sans, MD .  Documentation: I have reviewed the above documentation for accuracy and completeness, and I agree with the above.  Armida Sans, MD

## 2023-08-05 ENCOUNTER — Encounter: Payer: Self-pay | Admitting: Dermatology

## 2023-08-09 ENCOUNTER — Telehealth: Payer: Self-pay

## 2023-08-09 DIAGNOSIS — R1319 Other dysphagia: Secondary | ICD-10-CM

## 2023-08-09 DIAGNOSIS — R634 Abnormal weight loss: Secondary | ICD-10-CM

## 2023-08-09 MED ORDER — OMEPRAZOLE 40 MG PO CPDR
40.0000 mg | DELAYED_RELEASE_CAPSULE | Freq: Every day | ORAL | 1 refills | Status: DC
Start: 2023-08-09 — End: 2024-01-04

## 2023-08-09 NOTE — Telephone Encounter (Signed)
Last office visit 05/03/2023 was told to decrease to once a day  Last refill 04/22/2023 180 0 refills  No appointment is scheduled  Follow up as needed  Received refill request by fax

## 2023-08-13 ENCOUNTER — Encounter: Payer: Self-pay | Admitting: Dermatology

## 2023-08-23 ENCOUNTER — Ambulatory Visit
Admission: RE | Admit: 2023-08-23 | Discharge: 2023-08-23 | Disposition: A | Discharge status: Home / Self Care | Payer: Medicaid Other | Source: Ambulatory Visit | Attending: Family Medicine | Admitting: Family Medicine

## 2023-08-23 ENCOUNTER — Encounter: Payer: Self-pay | Admitting: Family Medicine

## 2023-08-23 ENCOUNTER — Ambulatory Visit: Payer: Medicaid Other | Admitting: Family Medicine

## 2023-08-23 VITALS — BP 130/74 | HR 59 | Temp 97.9°F | Resp 16 | Ht 71.0 in | Wt 247.9 lb

## 2023-08-23 DIAGNOSIS — E8840 Mitochondrial metabolism disorder, unspecified: Secondary | ICD-10-CM

## 2023-08-23 DIAGNOSIS — G4733 Obstructive sleep apnea (adult) (pediatric): Secondary | ICD-10-CM | POA: Diagnosis not present

## 2023-08-23 DIAGNOSIS — K219 Gastro-esophageal reflux disease without esophagitis: Secondary | ICD-10-CM

## 2023-08-23 DIAGNOSIS — I152 Hypertension secondary to endocrine disorders: Secondary | ICD-10-CM

## 2023-08-23 DIAGNOSIS — M25812 Other specified joint disorders, left shoulder: Secondary | ICD-10-CM

## 2023-08-23 DIAGNOSIS — E1129 Type 2 diabetes mellitus with other diabetic kidney complication: Secondary | ICD-10-CM

## 2023-08-23 DIAGNOSIS — J302 Other seasonal allergic rhinitis: Secondary | ICD-10-CM

## 2023-08-23 DIAGNOSIS — R569 Unspecified convulsions: Secondary | ICD-10-CM | POA: Diagnosis not present

## 2023-08-23 DIAGNOSIS — I10 Essential (primary) hypertension: Secondary | ICD-10-CM | POA: Diagnosis not present

## 2023-08-23 DIAGNOSIS — E1169 Type 2 diabetes mellitus with other specified complication: Secondary | ICD-10-CM | POA: Diagnosis not present

## 2023-08-23 DIAGNOSIS — E538 Deficiency of other specified B group vitamins: Secondary | ICD-10-CM | POA: Diagnosis not present

## 2023-08-23 DIAGNOSIS — E1159 Type 2 diabetes mellitus with other circulatory complications: Secondary | ICD-10-CM

## 2023-08-23 DIAGNOSIS — F5101 Primary insomnia: Secondary | ICD-10-CM | POA: Diagnosis not present

## 2023-08-23 LAB — POCT GLYCOSYLATED HEMOGLOBIN (HGB A1C): Hemoglobin A1C: 6.8 % — AB (ref 4.0–5.6)

## 2023-08-23 MED ORDER — MODAFINIL 100 MG PO TABS
100.0000 mg | ORAL_TABLET | Freq: Every day | ORAL | 0 refills | Status: DC
Start: 2023-08-23 — End: 2023-11-29

## 2023-08-23 MED ORDER — LOSARTAN POTASSIUM-HCTZ 50-12.5 MG PO TABS
1.0000 | ORAL_TABLET | Freq: Every day | ORAL | 1 refills | Status: DC
Start: 2023-08-23 — End: 2024-02-28

## 2023-08-23 MED ORDER — FLUTICASONE PROPIONATE 50 MCG/ACT NA SUSP
1.0000 | Freq: Every day | NASAL | 0 refills | Status: DC | PRN
Start: 2023-08-23 — End: 2023-11-16

## 2023-08-23 MED ORDER — CYANOCOBALAMIN 1000 MCG/ML IJ SOLN
1000.0000 ug | Freq: Once | INTRAMUSCULAR | Status: AC
Start: 2023-08-23 — End: 2023-08-23
  Administered 2023-08-23: 1000 ug via INTRAMUSCULAR

## 2023-08-23 MED ORDER — TRULICITY 0.75 MG/0.5ML ~~LOC~~ SOAJ
0.7500 mg | SUBCUTANEOUS | 0 refills | Status: DC
Start: 2023-08-23 — End: 2023-11-09

## 2023-08-23 MED ORDER — TEMAZEPAM 15 MG PO CAPS
15.0000 mg | ORAL_CAPSULE | Freq: Every day | ORAL | 0 refills | Status: DC
Start: 2023-08-23 — End: 2024-01-06

## 2023-08-23 NOTE — Progress Notes (Signed)
Name: [name] [name]   MRN: [id number]    DOB: 12-20-1961   Date:08/23/2023       Progress Note  Subjective  Chief Complaint  Follow up   HPI  DMII:  he had lost a lot of weight and glucose was dropping, he has been off medications for months, however had steroid injections on right wrist two months ago and glucose has been up since. He has also gained weight and has polydipsia. Today A1C is up again and we will resume Trulicity. He takes ARB and statin therapy   Mitochondrial disease   going to [facility] and seeing neuromuscular sub-specialist. He has neuromuscular atrophy and had significant weight loss due to muscle mass loss .  He also has dysphagia but stable now . Symptoms are stable, he has some tremors intermittently and still uses a cane , frequent falls.    A. 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 See comment. - Mild, chronic neurogenic atrophy.    Constipation: he has been taking dulcolax and milk of magnesia prn, he states Amitiza did not work , he continues to have high fiber diet, bowel movements about once every 2-3 days    HTN/Angina : He is taking carvedilol and Ranexa, off Imdur due to hypotension, he takes Zetia and back on Rosuvastatin given by Dr. Kirke Corin. .Denies recent chest pain or palpitation  Cardiac cath done 07/2021     Ost LAD to Mid LAD lesion is 10% stenosed.   Mid LAD lesion is 30% stenosed     SOB: going on since Fall 2021 - he has been evaluated by pulmonologist and cardiologist and no answers yet, cardiac cath unremarkable, also negative  CT chest. 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 He was admitted to [facility] from 04/18 to 04/21 for EEG with video and it looked negative for seizure . He is under the care of [facility] and has a follow up next month   Study from 03/2023: No nocturnal seizure like events identified--EEG was unremarkable and without interictal activity. Spells captured cannot be characterized, but there is no evidence of a forme pleine epileptic process.    Low B12: he is getting B12 injections  monthly in our office . Unchanged    OSA: he has a new CPAP machine and is compliant however continues to feel tired all day, we will try  modafinil today    Cervical left radiculopathy: seen by Dr. Alfredo Batty and PT advised since his SOB and recent health issues makes him a poor candidate for surgery at this time.He saw Dr. Edwinna Areola and was referred to pain medicine - Dr. Cherylann Ratel , but he decided not to go back Currently not taking medications for pain . He lost his balance and used his left hand to grab the railing and still feel , he jerked his arm as he was going down. It happened 3 weeks ago, he is having pain and difficulty abducting left arm, feeling weaker    Imaging: MRI C spine 08/26/2021 IMPRESSION:  1. Multilevel spondylosis of the cervical spine as described.  2. Moderate foraminal narrowing bilaterally  at C2-3, right greater  than left.  3. Moderate right mild left foraminal narrowing at C3-4.  4. Moderate foraminal narrowing bilaterally at C5-6 and C6-7 is  worse on the right.  5. Mild central canal narrowing at C6-7.    Malnutrition: BMI is now down to 30, he has lost about 50 lbs, his baseline weight was 262 lbs, he is trying to increase calorie intake and eating 5 times a day, weight is down a couple of pounds since last visit.    Enlarged thyroid: CT done 03/2021 at [facility], showed goiter and compression of trachea , TSH has been at goal . Unchanged   History of gastritis: seen by Dr. Allegra Lai, advised to continue omeprazole once a day, no  longer on sucralfate. He is off Trulicity and bloating has improved .   Paresthesias of both hands : he is seeing neurologist and had EMG yesterday , discussed surgery but he is afraid to have it done   EMG Conclusion/05/10/2023 This is an abnormal study. There is electrodiagnostic and ultrasonographic evidence of moderate left and mild right median neuropathy at the wrist (carpal tunnel syndrome). There is no electrodiagnostic evidence of left cervical radiculopathy.   Patient Active Problem List   Diagnosis Date Noted   Bilateral carpal tunnel syndrome 05/11/2023   Dyslipidemia associated with type 2 diabetes mellitus (HCC) 05/11/2023   Right groin pain 02/03/2023   Neurogenic muscular atrophy 05/08/2022   Other insomnia 05/08/2022   Controlled type 2 diabetes mellitus with microalbuminuria, without long-term current use of insulin (HCC) 05/08/2022   Contraindication to statin medication 05/08/2022   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HCC) 01/22/2022   Tremor 01/22/2022   Shortness of breath 01/22/2022   Muscle atrophy of lower extremity 11/10/2021   Dysphagia    Asthma 05/14/2021   Multiple thyroid nodules 05/14/2021   Localized, primary osteoarthritis of hand 09/28/2019   Seizure-like activity (HCC) 02/22/2018   Mild cognitive impairment 11/17/2017   Radiculopathy of cervical region 11/17/2017   Stenosis of carotid artery 09/08/2017   Cervical stenosis of spine 09/08/2017   B12 deficiency 06/28/2017   Headache disorder 02/15/2017   Memory loss or impairment 02/15/2017   Coronary artery disease involving native coronary artery of native heart    Radiculitis of left cervical region 03/05/2016   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) 12/05/2015   Patellar subluxation 06/05/2015   Allergic rhinitis, seasonal 06/05/2015   Chronic constipation 06/05/2015   Chronic lower back pain 06/05/2015   Vitamin D deficiency 06/05/2015   Central sleep apnea 06/05/2015    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   Nerve root pain 06/05/2015   Acquired polycythemia 06/05/2015   Anterior knee pain 06/05/2015   Failure of erection 06/05/2015   Gastro-esophageal reflux disease without esophagitis 06/05/2015   Neuropathy 06/05/2015   Angina, class III (HCC) 05/23/2015   Essential hypertension 05/23/2015   Hyperlipidemia 05/23/2015   Inguinal hernia without mention of obstruction or gangrene, recurrent unilateral or unspecified 03/24/2013    Past Surgical History:  Procedure Laterality Date   BACK SURGERY  12/2008   X2-LUMBAR   CARDIAC CATHETERIZATION N/A 05/30/2015   Procedure: Left Heart Cath;  Surgeon: Iran Ouch, MD;  Location: [facility] INVASIVE CV LAB;  Service: Cardiovascular;  Laterality: N/A;   CARDIAC CATHETERIZATION N/A 04/16/2016   Procedure: Left Heart Cath and Coronary Angiography;  Surgeon: Iran Ouch, MD;  Location: [facility] INVASIVE CV LAB;  Service: Cardiovascular;  Laterality: N/A;   COLONOSCOPY  2014   Dr.  Sankar   COLONOSCOPY WITH PROPOFOL N/A 12/25/2016   Procedure: COLONOSCOPY WITH PROPOFOL;  Surgeon: Wyline Mood, MD;  Location: [facility] ENDOSCOPY;  Service: Endoscopy;  Laterality: N/A;   COLONOSCOPY WITH PROPOFOL N/A 01/29/2017   Procedure: COLONOSCOPY WITH PROPOFOL;  Surgeon: Wyline Mood, MD;  Location: [facility] ENDOSCOPY;  Service: Endoscopy;  Laterality: N/A;   COLONOSCOPY WITH PROPOFOL N/A 04/11/2020   Procedure: COLONOSCOPY WITH PROPOFOL;  Surgeon: Wyline Mood, MD;  Location: [facility] ENDOSCOPY;  Service: Gastroenterology;  Laterality: N/A;   CORONARY PRESSURE/FFR STUDY N/A 07/30/2021   Procedure: INTRAVASCULAR PRESSURE WIRE/FFR STUDY;  Surgeon: Iran Ouch, MD;  Location: [facility] INVASIVE CV LAB;  Service: Cardiovascular;  Laterality: N/A;   ESOPHAGEAL MANOMETRY N/A 03/04/2022   Procedure: ESOPHAGEAL MANOMETRY (EM);  Surgeon: Napoleon Form, MD;  Location: [facility] ENDOSCOPY;  Service: Gastroenterology;  Laterality: N/A;   ESOPHAGOGASTRODUODENOSCOPY  (EGD) WITH PROPOFOL N/A 01/26/2019   Procedure: ESOPHAGOGASTRODUODENOSCOPY (EGD) WITH PROPOFOL;  Surgeon: Toney Reil, MD;  Location: [facility] ENDOSCOPY;  Service: Gastroenterology;  Laterality: N/A;   ESOPHAGOGASTRODUODENOSCOPY (EGD) WITH PROPOFOL N/A 09/10/2021   Procedure: ESOPHAGOGASTRODUODENOSCOPY (EGD) WITH PROPOFOL;  Surgeon: Toney Reil, MD;  Location: [facility] ENDOSCOPY;  Service: Gastroenterology;  Laterality: N/A;   EVALUATION UNDER ANESTHESIA WITH HEMORRHOIDECTOMY N/A 02/24/2017   Procedure: EXAM UNDER ANESTHESIA WITH POSSIBLE EXCISION OF INTERNAL HEMORRHOIDS;  Surgeon: Henrene Dodge, MD;  Location: [facility] ORS;  Service: General;  Laterality: N/A;   FISSURECTOMY  02/24/2017   Procedure: FISSURECTOMY;  Surgeon: Henrene Dodge, MD;  Location: [facility] ORS;  Service: General;;   HAND SURGERY Left 2020   HERNIA REPAIR Right 1991   INGUINAL HERNIA REPAIR Right 2012   Dr Sherral Hammers HERNIA REPAIR Right 2014   Dr Evette Cristal   LEFT HEART CATH AND CORONARY ANGIOGRAPHY Left 08/05/2020   Procedure: LEFT HEART CATH AND CORONARY ANGIOGRAPHY poss PCI;  Surgeon: Iran Ouch, MD;  Location: [facility] INVAS[facility] CV LAB;  Service: Cardiovascular;  Laterality: Left;   MUSCLE BIOPSY Left 12/31/2021   Procedure: LEFT VASTUS LATERALIS BIOPSY;  Surgeon: Venetia Night, MD;  Location: [facility] ORS;  Service: Neurosurgery;  Laterality: Left;   RIGHT/LEFT HEART CATH AND CORONARY ANGIOGRAPHY N/A 07/30/2021   Procedure: RIGHT/LEFT HEART CATH AND CORONARY ANGIOGRAPHY;  Surgeon: Iran Ouch, MD;  Location: [facility] INVASIVE CV LAB;  Service: Cardiovascular;  Laterality: N/A;   WRIST SURGERY Left    XI ROBOT ASSISTED DIAGNOSTIC LAPAROSCOPY Right 02/03/2023   Procedure: DIAGNOSTIC LAPAROSCOPY;  Surgeon: Henrene Dodge, MD;  Location: [facility] ORS;  Service: General;  Laterality: Right;    Family History  Problem Relation Age of Onset   Hypertension Mother    Heart Problems Mother    High Cholesterol Mother     Heart Problems Father 71       myocardial infarction   Mental illness Brother    Other Maternal Aunt        COVID   Lung cancer Maternal Aunt    Lung cancer Maternal Aunt    Cancer Maternal Uncle        unknown   Prostate cancer Maternal Uncle    Testicular cancer Paternal Uncle    Thyroid disease Daughter    Breast cancer Cousin     Social History   Tobacco Use   Smoking status: Former    Types: Cigars    Start date: 12/28/2002    Quit date: 06/04/2005    Years since quitting: 18.2   Smokeless tobacco: Never   Tobacco comments:  quit 2006-smoked 1 cigar occ  Substance Use Topics   Alcohol use: Not Currently    Comment: has not had a drink in 8 months 02/2021     Current Outpatient Medications:    ACCU-CHEK GUIDE test strip, USE TO TEST BLOOD SUGAR THREE TIMES DAILY AS NEEDED, Disp: 100 strip, Rfl: 1   Accu-Chek Softclix Lancets lancets, TEST TWICE DAILY, Disp: 100 each, Rfl: 3   Alpha Lipoic Acid 200 MG CAPS, Take 200 mg by mouth daily., Disp: , Rfl:    aspirin EC 81 MG tablet, Take by mouth., Disp: , Rfl:    blood glucose meter kit and supplies, 1 each by Other route in the morning and at bedtime. Dispense based on patient and insurance preference. Use up to two  times daily. (FOR ICD-10 E10.9, E11.9)., Disp: 1 each, Rfl: 0   carvedilol (COREG) 6.25 MG tablet, TAKE 1 TABLET(6.25 MG) BY MOUTH TWICE DAILY, Disp: 180 tablet, Rfl: 1   Coenzyme Q10 (COQ-10) 100 MG CAPS, Take 100 mg by mouth daily., Disp: , Rfl:    Dulaglutide (TRULICITY) 0.75 MG/0.5ML SOPN, Inject 0.75 mg into the skin once a week., Disp: 6 mL, Rfl: 0   ezetimibe (ZETIA) 10 MG tablet, Take 1 tablet (10 mg total) by mouth daily. (Patient taking differently: Take 10 mg by mouth every morning.), Disp: 90 tablet, Rfl: 3   fluticasone furoate-vilanterol (BREO ELLIPTA) 100-25 MCG/ACT AEPB, INHALE 1 PUFF INTO THE LUNGS DAILY, Disp: 60 each, Rfl: 6   hydrocortisone 2.5 % cream, Apply to hyperpigmentation on face QD on  Monday, Wednesday, and Friday PRN., Disp: 28 g, Rfl: 3   ibuprofen (ADVIL) 800 MG tablet, Take 800 mg by mouth every 6 (six) hours as needed., Disp: , Rfl:    ketoconazole (NIZORAL) 2 % shampoo, Wash scalp/body/face QD on Tuesday, Thursday, and Saturday, let sit 5 minutes before rinsing off, avoid eyes., Disp: 120 mL, Rfl: 11   levOCARNitine (CARNITOR) 330 MG tablet, Take 330 mg by mouth 2 (two) times daily., Disp: , Rfl:    modafinil (PROVIGIL) 100 MG tablet, Take 1 tablet (100 mg total) by mouth daily., Disp: 90 tablet, Rfl: 0   nitroGLYCERIN (NITROSTAT) 0.4 MG SL tablet, Place 1 tablet (0.4 mg total) under the tongue every 5 (five) minutes as needed for chest pain., Disp: , Rfl:    omeprazole (PRILOSEC) 40 MG capsule, Take 1 capsule (40 mg total) by mouth daily., Disp: 90 capsule, Rfl: 1   ranolazine (RANEXA) 1000 MG SR tablet, TAKE 1 TABLET(1000 MG) BY MOUTH TWICE DAILY, Disp: 180 tablet, Rfl: 0   Riboflavin (B-2) 100 MG TABS, Take 100 mg by mouth daily., Disp: , Rfl:    rosuvastatin (CRESTOR) 10 MG tablet, Take 1 tablet (10 mg total) by mouth daily., Disp: 90 tablet, Rfl: 3   sildenafil (VIAGRA) 100 MG tablet, Take 1 tablet (100 mg total) by mouth daily as needed for erectile dysfunction., Disp: 30 tablet, Rfl: 0   Sulfacetamide Sodium 9.8 % SHAM, Shampoo into the face and trunk QD on Monday, Wednesday, and Friday. Let sit a few minutes before washing off., Disp: 120 mL, Rfl: 11   Vitamin D, Ergocalciferol, (DRISDOL) 1.25 MG (50000 UNIT) CAPS capsule, Take 1 capsule (50,000 Units total) by mouth every 7 (seven) days., Disp: 12 capsule, Rfl: 1   fluticasone (FLONASE) 50 MCG/ACT nasal spray, Place 1 spray into both nostrils daily as needed for allergies or rhinitis., Disp: 48 g, Rfl: 0   losartan-hydrochlorothiazide (HYZAAR) 50-12.5  MG tablet, Take 1 tablet by mouth daily., Disp: 90 tablet, Rfl: 1   temazepam (RESTORIL) 15 MG capsule, Take 1 capsule (15 mg total) by mouth at bedtime., Disp: 90  capsule, Rfl: 0  Allergies  Allergen Reactions   Gabapentin Other (See Comments)    Groggy-Mood Changes   Latex Rash   Penicillins Hives and Rash    Tolerated 1st generation cephalosporin (CEFAZOLIN) on 12/31/2021 with no documented ADRs.  PCN reaction causing immediate rash, facial/tongue/throat swelling, SOB or lightheadedness with hypotension: Yes PCN reaction causing severe rash involving mucus membranes or skin necrosis: No PCN reaction that required hospitalization No PCN reaction occurring within the last 10 years: No If all of the above answers are "NO", then may proceed with Cephalosporin use.     I personally reviewed active problem list, medication list, allergies, family history with the patient/caregiver today.   ROS  Constitutional: Negative for fever , positive for weight change.  Respiratory: Negative for cough and shortness of breath.   Cardiovascular: Negative for chest pain or palpitations.  Gastrointestinal: Negative for abdominal pain, no bowel changes.  Musculoskeletal:  pain left shoulder, uses a cane due to balance problems Skin: Negative for rash.  Neurological: Negative for dizziness or headache.  No other specific complaints in a complete review of systems (except as listed in HPI above).   Objective  Vitals:   08/23/23 0918  BP: 130/74  Pulse: (!) 59  Resp: 16  Temp: 97.9 F (36.6 C)  TempSrc: Oral  SpO2: 97%  Weight: 247 lb 14.4 oz (112.4 kg)  Height: 5\' 11"  (1.803 m)    Body mass index is 34.58 kg/m.  Physical Exam  Constitutional: Patient appears well-developed and well-nourished. Obese  No distress.  HEENT: head atraumatic, normocephalic, pupils equal and reactive to light, neck supple Cardiovascular: Normal rate, regular rhythm and normal heart sounds.  No murmur heard. No BLE edema. Pulmonary/Chest: Effort normal and breath sounds normal. No respiratory distress. Muscular skeletal: decrease abduction of left shoulder, positive  empty can sign on left  Abdominal: Soft.  There is no tenderness. Psychiatric: Patient has a normal mood and affect. behavior is normal. Judgment and thought content normal.   Recent Results (from the past 2160 hour(s))  POCT HgB A1C     Status: Abnormal   Collection Time: 08/23/23  9:20 AM  Result Value Ref Range   Hemoglobin A1C 6.8 (A) 4.0 - 5.6 %   HbA1c POC (<> result, manual entry)     HbA1c, POC (prediabetic range)     HbA1c, POC (controlled diabetic range)       PHQ2/9:    08/23/2023    9:08 AM 05/11/2023    9:49 AM 04/21/2023   11:17 AM 02/08/2023    9:29 AM 12/31/2022   10:06 AM  Depression screen PHQ 2/9  Decreased Interest 0 0 0 0 0  Down, Depressed, Hopeless 0 0 0 0 0  PHQ - 2 Score 0 0 0 0 0  Altered sleeping 0 0 0 0 0  Tired, decreased energy 0 0 0 0 0  Change in appetite 0 0 0 0 0  Feeling bad or failure about yourself  0 0 0 0 0  Trouble concentrating 0 0 0 0 0  Moving slowly or fidgety/restless 0 0 0 0 0  Suicidal thoughts 0 0 0 0 0  PHQ-9 Score 0 0 0 0 0  Difficult doing work/chores Not difficult at all  phq 9 is negative   Fall Risk:    08/23/2023    9:08 AM 05/11/2023    9:49 AM 04/21/2023   11:16 AM 02/08/2023    9:29 AM 12/31/2022   10:05 AM  Fall Risk   Falls in the past year? 1 0 0 0 1  Number falls in past yr: 1 0 0 0 0  Injury with Fall? 0 0 0 0 0  Risk for fall due to : Impaired balance/gait No Fall Risks Impaired balance/gait;No Fall Risks Impaired balance/gait Impaired balance/gait  Follow up Falls prevention discussed;Education provided;Falls evaluation completed Falls prevention discussed Falls prevention discussed Falls prevention discussed Falls prevention discussed      Functional Status Survey: Is the patient deaf or have difficulty hearing?: No Does the patient have difficulty seeing, even when wearing glasses/contacts?: No Does the patient have difficulty concentrating, remembering, or making decisions?: No Does the patient  have difficulty walking or climbing stairs?: Yes Does the patient have difficulty dressing or bathing?: No Does the patient have difficulty doing errands alone such as visiting a doctor's office or shopping?: No    Assessment & Plan  1. Controlled type 2 diabetes mellitus with microalbuminuria, without long-term current use of insulin (HCC)  - POCT HgB A1C - Dulaglutide (TRULICITY) 0.75 MG/0.5ML SOPN; Inject 0.75 mg into the skin once a week.  Dispense: 6 mL; Refill: 0 - losartan-hydrochlorothiazide (HYZAAR) 50-12.5 MG tablet; Take 1 tablet by mouth daily.  Dispense: 90 tablet; Refill: 1  2. 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HCC)  Under the care of [facility]   3. Seizure-like activity (HCC)  No episodes   4. Dyslipidemia associated with type 2 diabetes mellitus (HCC)  - Dulaglutide (TRULICITY) 0.75 MG/0.5ML SOPN; Inject 0.75 mg into the skin once a week.  Dispense: 6 mL; Refill: 0  5. Hypertension associated with diabetes (HCC)  BP is at goal   6. B12 deficiency  - cyanocobalamin (VITAMIN B12) injection 1,000 mcg  7. Gastro-esophageal reflux disease without esophagitis  Under control at this time  8. OSA on CPAP  - modafinil (PROVIGIL) 100 MG tablet; Take 1 tablet (100 mg total) by mouth daily.  Dispense: 90 tablet; Refill: 0  9. Primary insomnia  - temazepam (RESTORIL) 15 MG capsule; Take 1 capsule (15 mg total) by mouth at bedtime.  Dispense: 90 capsule; Refill: 0  10. Seasonal allergic rhinitis, unspecified trigger  - fluticasone (FLONASE) 50 MCG/ACT nasal spray; Place 1 spray into both nostrils daily as needed for allergies or rhinitis.  Dispense: 48 g; Refill: 0  11. Essential hypertension  - losartan-hydrochlorothiazide (HYZAAR) 50-12.5 MG tablet; Take 1 tablet by mouth daily.  Dispense: 90 tablet; Refill: 1  12. Impingement of left shoulder  - MR Shoulder Left Wo Contrast; Future - DG Shoulder Left; Future

## 2023-08-25 ENCOUNTER — Encounter: Payer: Self-pay | Admitting: Family Medicine

## 2023-09-03 ENCOUNTER — Ambulatory Visit: Payer: Medicaid Other

## 2023-09-03 ENCOUNTER — Other Ambulatory Visit: Payer: Self-pay | Admitting: Family Medicine

## 2023-09-03 DIAGNOSIS — G713 Mitochondrial myopathy, not elsewhere classified: Secondary | ICD-10-CM | POA: Diagnosis not present

## 2023-09-03 DIAGNOSIS — M25512 Pain in left shoulder: Secondary | ICD-10-CM | POA: Diagnosis not present

## 2023-09-03 DIAGNOSIS — R200 Anesthesia of skin: Secondary | ICD-10-CM | POA: Diagnosis not present

## 2023-09-03 DIAGNOSIS — F5101 Primary insomnia: Secondary | ICD-10-CM

## 2023-09-03 DIAGNOSIS — R29898 Other symptoms and signs involving the musculoskeletal system: Secondary | ICD-10-CM | POA: Diagnosis not present

## 2023-09-13 ENCOUNTER — Ambulatory Visit: Payer: Medicaid Other | Admitting: Family Medicine

## 2023-09-13 DIAGNOSIS — E559 Vitamin D deficiency, unspecified: Secondary | ICD-10-CM | POA: Diagnosis not present

## 2023-09-13 DIAGNOSIS — Z23 Encounter for immunization: Secondary | ICD-10-CM | POA: Diagnosis not present

## 2023-09-13 DIAGNOSIS — G713 Mitochondrial myopathy, not elsewhere classified: Secondary | ICD-10-CM | POA: Diagnosis not present

## 2023-09-21 ENCOUNTER — Encounter: Payer: Self-pay | Admitting: Family Medicine

## 2023-09-21 DIAGNOSIS — R519 Headache, unspecified: Secondary | ICD-10-CM | POA: Diagnosis not present

## 2023-09-21 DIAGNOSIS — R42 Dizziness and giddiness: Secondary | ICD-10-CM | POA: Diagnosis not present

## 2023-09-21 DIAGNOSIS — R569 Unspecified convulsions: Secondary | ICD-10-CM | POA: Diagnosis not present

## 2023-09-22 ENCOUNTER — Ambulatory Visit (INDEPENDENT_AMBULATORY_CARE_PROVIDER_SITE_OTHER): Payer: Medicaid Other

## 2023-09-22 DIAGNOSIS — Z23 Encounter for immunization: Secondary | ICD-10-CM | POA: Diagnosis not present

## 2023-09-22 DIAGNOSIS — E538 Deficiency of other specified B group vitamins: Secondary | ICD-10-CM | POA: Diagnosis not present

## 2023-09-22 MED ORDER — CYANOCOBALAMIN 1000 MCG/ML IJ SOLN
1000.0000 ug | Freq: Once | INTRAMUSCULAR | Status: AC
Start: 2023-09-22 — End: 2023-09-22
  Administered 2023-09-22: 1000 ug via INTRAMUSCULAR

## 2023-09-29 ENCOUNTER — Encounter: Payer: Self-pay | Admitting: Dermatology

## 2023-10-17 ENCOUNTER — Other Ambulatory Visit: Payer: Self-pay | Admitting: Family Medicine

## 2023-10-17 DIAGNOSIS — N529 Male erectile dysfunction, unspecified: Secondary | ICD-10-CM

## 2023-10-20 DIAGNOSIS — M5023 Other cervical disc displacement, cervicothoracic region: Secondary | ICD-10-CM | POA: Diagnosis not present

## 2023-10-20 DIAGNOSIS — M50222 Other cervical disc displacement at C5-C6 level: Secondary | ICD-10-CM | POA: Diagnosis not present

## 2023-10-20 DIAGNOSIS — M47812 Spondylosis without myelopathy or radiculopathy, cervical region: Secondary | ICD-10-CM | POA: Diagnosis not present

## 2023-10-20 DIAGNOSIS — M5031 Other cervical disc degeneration,  high cervical region: Secondary | ICD-10-CM | POA: Diagnosis not present

## 2023-10-20 DIAGNOSIS — R200 Anesthesia of skin: Secondary | ICD-10-CM | POA: Diagnosis not present

## 2023-10-20 DIAGNOSIS — R29898 Other symptoms and signs involving the musculoskeletal system: Secondary | ICD-10-CM | POA: Diagnosis not present

## 2023-10-20 DIAGNOSIS — M25512 Pain in left shoulder: Secondary | ICD-10-CM | POA: Diagnosis not present

## 2023-10-26 DIAGNOSIS — G713 Mitochondrial myopathy, not elsewhere classified: Secondary | ICD-10-CM | POA: Diagnosis not present

## 2023-10-26 NOTE — Progress Notes (Unsigned)
Name: [name] [name]   MRN: [id number]    DOB: 09/15/1961   Date:10/27/2023       Progress Note  Subjective  Chief Complaint  Follow Up  HPI    Cervical left radiculopathy: He saw Dr. Edwinna Areola and was referred to pain medicine - Dr. Cherylann Ratel , but he decided not to go back  to [facility] instead of taking pain medications at the time. He lost his balance and used his left hand to grab the railing this Summer and continue to have increase in pain on left shoulder , still having some numbness and tingling on C-7 distribution . He was seen by [facility] and after studies advised to go to Ortho, appointment scheduled for Dec 17 th 2024     IMPRESSION: MRI cervical spine done 10/20/2023   1. Multilevel cervical spine degenerative disc disease and facet  arthropathy. Varying degrees of disc bulges and protrusion in the same  areas of the prior exam. Multilevel central canal and neural foraminal  stenosis. Findings are most pronounced at C4-C5.  2. The C4-C5 findings show no significant interval change. Other areas of  either stable or show mild progression since the prior exam.   IMPRESSION: 10/22/2023 1. Tendinosis in the supraspinatus, infraspinatus and subscapularis  tendons. No evidence of full-thickness rotator cuff tear. Small amounts of  fluid in the subdeltoid bursa.  2. Fluid in the biceps tendon sheath, most likely from the glenohumeral  joint. Tenosynovitis can also contribute to this appearance. Tendinosis in  the upper portion of the long head biceps tendon  3. Degenerative changes in the acromioclavicular joint with impression on  the musculotendinous junction of the supraspinatus tendon. Mild narrowing  of the subacromial space. Assessment for impingement should be based on  clinical grounds.  4. Small degenerative type subcortical cysts in the humeral head. Minimal  contusion can contribute to some of these findings.    Low B12: he has a B12 injection today     Patient Active  Problem List   Diagnosis Date Noted   Bilateral carpal tunnel syndrome 05/11/2023   Dyslipidemia associated with type 2 diabetes mellitus (HCC) 05/11/2023   Right groin pain 02/03/2023   Neurogenic muscular atrophy 05/08/2022   Other insomnia 05/08/2022   Controlled type 2 diabetes mellitus with microalbuminuria, without long-term current use of insulin (HCC) 05/08/2022   Contraindication to statin medication 05/08/2022   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HCC) 01/22/2022   Tremor 01/22/2022   Shortness of breath 01/22/2022   Muscle atrophy of lower extremity 11/10/2021   Dysphagia    Asthma 05/14/2021   Multiple thyroid nodules 05/14/2021   Localized, primary osteoarthritis of hand 09/28/2019   Seizure-like activity (HCC) 02/22/2018   Mild cognitive impairment 11/17/2017   Radiculopathy of cervical region 11/17/2017   Stenosis of carotid artery 09/08/2017   Cervical stenosis of spine 09/08/2017   B12 deficiency 06/28/2017   Headache disorder 02/15/2017   Memory loss or impairment 02/15/2017   Coronary artery disease involving native coronary artery of native heart    Radiculitis of left cervical region 03/05/2016   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) 12/05/2015   Patellar subluxation 06/05/2015   Allergic rhinitis, seasonal 06/05/2015   Chronic constipation 06/05/2015   Chronic lower back pain 06/05/2015   Vitamin D deficiency 06/05/2015   Central sleep apnea 06/05/2015   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   Nerve root pain 06/05/2015   Acquired polycythemia 06/05/2015   Anterior knee pain 06/05/2015   Failure of  erection 06/05/2015   Gastro-esophageal reflux disease without esophagitis 06/05/2015   Neuropathy 06/05/2015   Angina, class III (HCC) 05/23/2015   Essential hypertension 05/23/2015   Hyperlipidemia 05/23/2015   Inguinal hernia without mention of obstruction or gangrene, recurrent unilateral or unspecified 03/24/2013    Past Surgical History:   Procedure Laterality Date   BACK SURGERY  12/2008   X2-LUMBAR   CARDIAC CATHETERIZATION N/A 05/30/2015   Procedure: Left Heart Cath;  Surgeon: Iran Ouch, MD;  Location: [facility];  Service: Cardiovascular;  Laterality: N/A;   CARDIAC CATHETERIZATION N/A 04/16/2016   Procedure: Left Heart Cath and Coronary Angiography;  Surgeon: Iran Ouch, MD;  Location: [facility];  Service: Cardiovascular;  Laterality: N/A;   COLONOSCOPY  2014   Dr. Evette Cristal   COLONOSCOPY WITH PROPOFOL N/A 12/25/2016   Procedure: COLONOSCOPY WITH PROPOFOL;  Surgeon: Wyline Mood, MD;  Location: [facility];  Service: Endoscopy;  Laterality: N/A;   COLONOSCOPY WITH PROPOFOL N/A 01/29/2017   Procedure: COLONOSCOPY WITH PROPOFOL;  Surgeon: Wyline Mood, MD;  Location: [facility];  Service: Endoscopy;  Laterality: N/A;   COLONOSCOPY WITH PROPOFOL N/A 04/11/2020   Procedure: COLONOSCOPY WITH PROPOFOL;  Surgeon: Wyline Mood, MD;  Location: [facility] ENDOSCOPY;  Service: Gastroenterology;  Laterality: N/A;   CORONARY PRESSURE/FFR STUDY N/A 07/30/2021   Procedure: INTRAVASCULAR PRESSURE WIRE/FFR STUDY;  Surgeon: Iran Ouch, MD;  Location: [facility];  Service: Cardiovascular;  Laterality: N/A;   ESOPHAGEAL MANOMETRY N/A 03/04/2022   Procedure: ESOPHAGEAL MANOMETRY (EM);  Surgeon: Napoleon Form, MD;  Location: [facility]OS[facility];  Service: Gastroenterology;  Laterality: N/A;   ESOPHAGOGASTRODUODENOSCOPY (EGD) WITH PROPOFOL N/A 01/26/2019   Procedure: ESOPHAGOGASTRODUODENOSCOPY (EGD) WITH PROPOFOL;  Surgeon: Toney Reil, MD;  Location: [facility] ENDOSCOPY;  Service: Gastroenterology;  Laterality: N/A;   ESOPHAGOGASTRODUODENOSCOPY (EGD) WITH PROPOFOL N/A 09/10/2021   Procedure: ESOPHAGOGASTRODUODENOSCOPY (EGD) WITH PROPOFOL;  Surgeon: Toney Reil, MD;  Location: [facility] ENDOSCOPY;  Service: Gastroenterology;  Laterality: N/A;   EVALUATION UNDER ANESTHESIA WITH HEMORRHOIDECTOMY N/A  02/24/2017   Procedure: EXAM UNDER ANESTHESIA WITH POSSIBLE EXCISION OF INTERNAL HEMORRHOIDS;  Surgeon: Henrene Dodge, MD;  Location: [facility] ORS;  Service: General;  Laterality: N/A;   FISSURECTOMY  02/24/2017   Procedure: FISSURECTOMY;  Surgeon: Henrene Dodge, MD;  Location: [facility] OR[facility];  Service: General;;   HAND SURGERY Left 2020   HERNIA REPAIR Right 1991   INGUINAL HERNIA REPAIR Right 2012   Dr Sherral Hammers HERNIA REPAIR Right 2014   Dr Evette Cristal   LEFT HEART CATH AND CORONARY ANGIOGRAPHY Left 08/05/2020   Procedure: LEFT HEART CATH AND CORONARY ANGIOGRAPHY poss PCI;  Surgeon: Iran Ouch, MD;  Location: [facility];  Service: Cardiovascular;  Laterality: Left;   MUSCLE BIOPSY Left 12/31/2021   Procedure: LEFT VASTUS LATERALIS BIOPSY;  Surgeon: Venetia Night, MD;  Location: [facility] OR[facility];  Service: Neurosurgery;  Laterality: Left;   RIGHT/LEFT HEART CATH AND CORONARY ANGIOGRAPHY N/A 07/30/2021   Procedure: RIGHT/LEFT HEART CATH AND CORONARY ANGIOGRAPHY;  Surgeon: Iran Ouch, MD;  Location: [facility];  Service: Cardiovascular;  Laterality: N/A;   WRIST SURGERY Left    XI ROBOT ASSISTED DIAGNOSTIC LAPAROSCOPY Right 02/03/2023   Procedure: DIAGNOSTIC LAPAROSCOPY;  Surgeon: Henrene Dodge, MD;  Location: [facility] OR[facility];  Service: General;  Laterality: Right;    Family History  Problem Relation Age of Onset   Hypertension Mother    Heart Problems Mother    High Cholesterol  Mother    Heart Problems Father 44       myocardial infarction   Mental illness Brother    Other Maternal Aunt        COVID   Lung cancer Maternal Aunt    Lung cancer Maternal Aunt    Cancer Maternal Uncle        unknown   Prostate cancer Maternal Uncle    Testicular cancer Paternal Uncle    Thyroid disease Daughter    Breast cancer Cousin     Social History   Tobacco Use   Smoking status: Former    Types: Cigars    Start date: 12/28/2002    Quit date: 06/04/2005    Years since quitting:  18.4   Smokeless tobacco: Never   Tobacco comments:    quit 2006-smoked 1 cigar occ  Substance Use Topics   Alcohol use: Not Currently    Comment: has not had a drink in 8 months 02/2021     Current Outpatient Medications:    ACCU-CHEK GUIDE test strip, USE TO TEST BLOOD SUGAR THREE TIMES DAILY AS NEEDED, Disp: 100 strip, Rfl: 1   Accu-Chek Softclix Lancets lancets, TEST TWICE DAILY, Disp: 100 each, Rfl: 3   Alpha Lipoic Acid 200 MG CAPS, Take 200 mg by mouth daily., Disp: , Rfl:    aspirin EC 81 MG tablet, Take by mouth., Disp: , Rfl:    blood glucose meter kit and supplies, 1 each by Other route in the morning and at bedtime. Dispense based on patient and insurance preference. Use up to two  times daily. (FOR ICD-10 E10.9, E11.9)., Disp: 1 each, Rfl: 0   carvedilol (COREG) 6.25 MG tablet, TAKE 1 TABLET(6.25 MG) BY MOUTH TWICE DAILY, Disp: 180 tablet, Rfl: 1   Coenzyme Q10 (COQ-10) 100 MG CAPS, Take 100 mg by mouth daily., Disp: , Rfl:    Dulaglutide (TRULICITY) 0.75 MG/0.5ML SOPN, Inject 0.75 mg into the skin once a week., Disp: 6 mL, Rfl: 0   ezetimibe (ZETIA) 10 MG tablet, Take 1 tablet (10 mg total) by mouth daily. (Patient taking differently: Take 10 mg by mouth every morning.), Disp: 90 tablet, Rfl: 3   fluticasone (FLONASE) 50 MCG/ACT nasal spray, Place 1 spray into both nostrils daily as needed for allergies or rhinitis., Disp: 48 g, Rfl: 0   fluticasone furoate-vilanterol (BREO ELLIPTA) 100-25 MCG/ACT AEPB, INHALE 1 PUFF INTO THE LUNGS DAILY, Disp: 60 each, Rfl: 6   hydrocortisone 2.5 % cream, Apply to hyperpigmentation on face QD on Monday, Wednesday, and Friday PRN., Disp: 28 g, Rfl: 3   ketoconazole (NIZORAL) 2 % shampoo, Wash scalp/body/face QD on Tuesday, Thursday, and Saturday, let sit 5 minutes before rinsing off, avoid eyes., Disp: 120 mL, Rfl: 11   levOCARNitine (CARNITOR) 330 MG tablet, Take 330 mg by mouth 2 (two) times daily., Disp: , Rfl:    losartan-hydrochlorothiazide  (HYZAAR) 50-12.5 MG tablet, Take 1 tablet by mouth daily., Disp: 90 tablet, Rfl: 1   modafinil (PROVIGIL) 100 MG tablet, Take 1 tablet (100 mg total) by mouth daily., Disp: 90 tablet, Rfl: 0   nitroGLYCERIN (NITROSTAT) 0.4 MG SL tablet, Place 1 tablet (0.4 mg total) under the tongue every 5 (five) minutes as needed for chest pain., Disp: , Rfl:    omeprazole (PRILOSEC) 40 MG capsule, Take 1 capsule (40 mg total) by mouth daily., Disp: 90 capsule, Rfl: 1   ranolazine (RANEXA) 1000 MG SR tablet, TAKE 1 TABLET(1000 MG) BY MOUTH TWICE DAILY,  Disp: 180 tablet, Rfl: 0   Riboflavin (B-2) 100 MG TABS, Take 100 mg by mouth daily., Disp: , Rfl:    rosuvastatin (CRESTOR) 10 MG tablet, Take 1 tablet (10 mg total) by mouth daily., Disp: 90 tablet, Rfl: 3   sildenafil (VIAGRA) 100 MG tablet, TAKE 1 TABLET(100 MG) BY MOUTH DAILY AS NEEDED FOR ERECTILE DYSFUNCTION, Disp: 30 tablet, Rfl: 0   Sulfacetamide Sodium 9.8 % SHAM, Shampoo into the face and trunk QD on Monday, Wednesday, and Friday. Let sit a few minutes before washing off., Disp: 120 mL, Rfl: 11   temazepam (RESTORIL) 15 MG capsule, Take 1 capsule (15 mg total) by mouth at bedtime., Disp: 90 capsule, Rfl: 0   Vitamin D, Ergocalciferol, (DRISDOL) 1.25 MG (50000 UNIT) CAPS capsule, Take 1 capsule (50,000 Units total) by mouth every 7 (seven) days., Disp: 12 capsule, Rfl: 1  Current Facility-Administered Medications:    cyanocobalamin (VITAMIN B12) injection 1,000 mcg, 1,000 mcg, Intramuscular, Once, Nadia Viar, Danna Hefty, MD  Allergies  Allergen Reactions   Gabapentin Other (See Comments)    Groggy-Mood Changes   Latex Rash   Penicillins Hives and Rash    Tolerated 1st generation cephalosporin (CEFAZOLIN) on 12/31/2021 with no documented ADRs.  PCN reaction causing immediate rash, facial/tongue/throat swelling, SOB or lightheadedness with hypotension: Yes PCN reaction causing severe rash involving mucus membranes or skin necrosis: No PCN reaction that  required hospitalization No PCN reaction occurring within the last 10 years: No If all of the above answers are "NO", then may proceed with Cephalosporin use.     I personally reviewed active problem list, medication list, allergies, family history, social history, health maintenance with the patient/caregiver today.   ROS  Ten systems reviewed and is negative except as mentioned in HPI    Objective  Vitals:   10/27/23 1025  BP: 132/68  Pulse: 60  Resp: (!) 95  Weight: 248 lb (112.5 kg)  Height: 5\' 11"  (1.803 m)    Body mass index is 34.59 kg/m.  Physical Exam  Constitutional: Patient appears well-developed and well-nourished. Obese  No distress.  HEENT: head atraumatic, normocephalic, pupils equal and reactive to light, neck supple, throat within normal limits Cardiovascular: Normal rate, regular rhythm and normal heart sounds.  No murmur heard. No BLE edema. Pulmonary/Chest: Effort normal and breath sounds normal. No respiratory distress. Abdominal: Soft.  There is no tenderness. Muscular skeletal: negative empty can sign, he has decrease and pain with abduction above 90 degrees also has pain with internal rotation  Psychiatric: Patient has a normal mood and affect. behavior is normal. Judgment and thought content normal.   Recent Results (from the past 2160 hour(s))  POCT HgB A1C     Status: Abnormal   Collection Time: 08/23/23  9:20 AM  Result Value Ref Range   Hemoglobin A1C 6.8 (A) 4.0 - 5.6 %   HbA1c POC (<> result, manual entry)     HbA1c, POC (prediabetic range)     HbA1c, POC (controlled diabetic range)      PHQ2/9:    10/27/2023   10:27 AM 08/23/2023    9:08 AM 05/11/2023    9:49 AM 04/21/2023   11:17 AM 02/08/2023    9:29 AM  Depression screen PHQ 2/9  Decreased Interest 0 0 0 0 0  Down, Depressed, Hopeless 0 0 0 0 0  PHQ - 2 Score 0 0 0 0 0  Altered sleeping 0 0 0 0 0  Tired, decreased energy 0 0 0 0  0  Change in appetite 0 0 0 0 0  Feeling bad  or failure about yourself  0 0 0 0 0  Trouble concentrating 0 0 0 0 0  Moving slowly or fidgety/restless 0 0 0 0 0  Suicidal thoughts 0 0 0 0 0  PHQ-9 Score 0 0 0 0 0  Difficult doing work/chores  Not difficult at all       phq 9 is negative   Fall Risk:    10/27/2023   10:27 AM 08/23/2023    9:08 AM 05/11/2023    9:49 AM 04/21/2023   11:16 AM 02/08/2023    9:29 AM  Fall Risk   Falls in the past year? 0 1 0 0 0  Number falls in past yr: 0 1 0 0 0  Injury with Fall? 0 0 0 0 0  Risk for fall due to : No Fall Risks Impaired balance/gait No Fall Risks Impaired balance/gait;No Fall Risks Impaired balance/gait  Follow up Falls prevention discussed Falls prevention discussed;Education provided;Falls evaluation completed Falls prevention discussed Falls prevention discussed Falls prevention discussed      Functional Status Survey: Is the patient deaf or have difficulty hearing?: No Does the patient have difficulty seeing, even when wearing glasses/contacts?: No Does the patient have difficulty concentrating, remembering, or making decisions?: No Does the patient have difficulty walking or climbing stairs?: Yes Does the patient have difficulty dressing or bathing?: No Does the patient have difficulty doing errands alone such as visiting a doctor's office or shopping?: No    Assessment & Plan  1. B12 deficiency  - cyanocobalamin (VITAMIN B12) injection 1,000 mcg  2. Impingement of left shoulder  Going to ortho in Dec 2024

## 2023-10-27 ENCOUNTER — Encounter: Payer: Self-pay | Admitting: Family Medicine

## 2023-10-27 ENCOUNTER — Ambulatory Visit (INDEPENDENT_AMBULATORY_CARE_PROVIDER_SITE_OTHER): Payer: Medicaid Other | Admitting: Family Medicine

## 2023-10-27 VITALS — BP 132/68 | HR 60 | Resp 12 | Ht 71.0 in | Wt 248.0 lb

## 2023-10-27 DIAGNOSIS — E538 Deficiency of other specified B group vitamins: Secondary | ICD-10-CM

## 2023-10-27 DIAGNOSIS — R809 Proteinuria, unspecified: Secondary | ICD-10-CM

## 2023-10-27 DIAGNOSIS — E1129 Type 2 diabetes mellitus with other diabetic kidney complication: Secondary | ICD-10-CM

## 2023-10-27 DIAGNOSIS — M25812 Other specified joint disorders, left shoulder: Secondary | ICD-10-CM | POA: Diagnosis not present

## 2023-10-27 MED ORDER — CYANOCOBALAMIN 1000 MCG/ML IJ SOLN
1000.0000 ug | Freq: Once | INTRAMUSCULAR | Status: AC
Start: 2023-10-27 — End: 2023-10-27
  Administered 2023-10-27: 1000 ug via INTRAMUSCULAR

## 2023-10-29 ENCOUNTER — Encounter: Payer: Self-pay | Admitting: Family Medicine

## 2023-11-08 ENCOUNTER — Other Ambulatory Visit: Payer: Self-pay | Admitting: Family Medicine

## 2023-11-08 ENCOUNTER — Encounter: Payer: Self-pay | Admitting: Family Medicine

## 2023-11-08 DIAGNOSIS — E1169 Type 2 diabetes mellitus with other specified complication: Secondary | ICD-10-CM

## 2023-11-08 DIAGNOSIS — E1129 Type 2 diabetes mellitus with other diabetic kidney complication: Secondary | ICD-10-CM

## 2023-11-09 ENCOUNTER — Other Ambulatory Visit: Payer: Self-pay | Admitting: Family Medicine

## 2023-11-09 DIAGNOSIS — E1129 Type 2 diabetes mellitus with other diabetic kidney complication: Secondary | ICD-10-CM

## 2023-11-09 DIAGNOSIS — E1169 Type 2 diabetes mellitus with other specified complication: Secondary | ICD-10-CM

## 2023-11-09 MED ORDER — TRULICITY 0.75 MG/0.5ML ~~LOC~~ SOAJ
0.7500 mg | SUBCUTANEOUS | 0 refills | Status: DC
Start: 2023-11-09 — End: 2024-01-14

## 2023-11-16 ENCOUNTER — Other Ambulatory Visit: Payer: Self-pay

## 2023-11-16 ENCOUNTER — Other Ambulatory Visit: Payer: Self-pay | Admitting: Family Medicine

## 2023-11-16 ENCOUNTER — Encounter: Payer: Self-pay | Admitting: Family Medicine

## 2023-11-16 DIAGNOSIS — N529 Male erectile dysfunction, unspecified: Secondary | ICD-10-CM

## 2023-11-16 DIAGNOSIS — J302 Other seasonal allergic rhinitis: Secondary | ICD-10-CM

## 2023-11-16 MED ORDER — SILDENAFIL CITRATE 100 MG PO TABS: 100.0000 mg | ORAL_TABLET | Freq: Every day | ORAL | 0 refills | Status: DC | PRN

## 2023-11-18 ENCOUNTER — Other Ambulatory Visit: Payer: Self-pay | Admitting: Family Medicine

## 2023-11-18 DIAGNOSIS — E559 Vitamin D deficiency, unspecified: Secondary | ICD-10-CM

## 2023-11-22 ENCOUNTER — Ambulatory Visit: Payer: Medicaid Other | Admitting: Family Medicine

## 2023-11-24 NOTE — Progress Notes (Signed)
Name: [name] [name]   MRN: [id number]    DOB: 03-25-1961   Date:11/29/2023       Progress Note  Subjective  Chief Complaint  Chief Complaint  Patient presents with   Medical Management of Chronic Issues    HPI  Discussed the use of AI scribe software for clinical note transcription with the patient, who gave verbal consent to proceed.  History of Present Illness   The patient, with a history of diabetes, dyslipidemia, angina, and hypertension, presents with a recent increase in HbA1c to 6.8% in August. However, the HbA1c has since improved to 6.4%. The patient had a period of time off Trulicity due to hypoglycemia but has since resumed the medication at a dose of 0.75. The patient denies symptoms of polydipsia, polyphagia, or polyuria.  The patient's blood pressure was initially elevated at 144/90, but subsequently decreased to 132/82. The patient is on carvedilol 6.25, losartan HCTZ 50/12.5, rosuvastatin 10mg , and ezetimibe . The patient denies any side effects from these medications.  The patient also has a history of obstructive sleep apnea and is compliant with BiPAP use, averaging seven hours a day. The patient is also on modafinil for daytime sleepiness.  The patient has been diagnosed with mitochondrial myopathy, confirmed by a biopsy done on December 31, 2021. The patient reports weakness and tremors, but states these symptoms have not worsened.  The patient also has a history of carpal tunnel in both wrists, with surgery performed on the left and corticosteroid injections on the right. The patient reports atrophy and weakness in the hands, but it is unclear if this is due to the carpal tunnel or the mitochondrial disease.  The patient has a history of gastritis and is on omeprazole. The patient reports some bloating but does not attribute this to Trulicity.  The patient also has a history of angina, managed with carvedilol and Ranexa. The patient reports some improvement in  shortness of breath.  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 The patient also has arthritis and tenosynovitis in the shoulder, which is managed conservatively.  The patient has a history of seizures, but EEG studies have been unremarkable. The patient has not had any recent episodes and is not on any anti-seizure medications.  The patient's weight has fluctuated, with a significant weight loss followed by weight gain. The patient's current BMI is over 35 with co-morbidities such as HTN, DM, angina therefore morbid obesity . The patient is on Trulicity for diabetes management and weight control.         Patient Active Problem List   Diagnosis Date Noted   Bilateral carpal tunnel syndrome 05/11/2023   Dyslipidemia associated with type 2 diabetes mellitus (HCC) 05/11/2023   Right groin pain 02/03/2023   Neurogenic muscular atrophy 05/08/2022   Other insomnia 05/08/2022   Controlled type 2 diabetes mellitus with microalbuminuria, without long-term current use of insulin (HCC) 05/08/2022   Contraindication to statin medication 05/08/2022   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HCC) 01/22/2022   Tremor 01/22/2022   Shortness of breath 01/22/2022   Muscle atrophy of lower extremity 11/10/2021   Dysphagia    Asthma 05/14/2021   Multiple thyroid nodules 05/14/2021   Localized, primary osteoarthritis of hand 09/28/2019   Seizure-like activity (HCC) 02/22/2018   Mild cognitive impairment 11/17/2017   Radiculopathy of cervical region 11/17/2017   Stenosis of carotid artery 09/08/2017   Cervical stenosis of spine 09/08/2017   B12  deficiency 06/28/2017   Headache disorder 02/15/2017   Memory loss or impairment 02/15/2017   Coronary artery disease involving native coronary artery of native heart    Radiculitis of left cervical region 03/05/2016   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)  12/05/2015   Patellar subluxation 06/05/2015   Allergic rhinitis, seasonal 06/05/2015   Chronic constipation 06/05/2015   Chronic lower back pain 06/05/2015   Vitamin D deficiency 06/05/2015   Central sleep apnea 06/05/2015   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   Nerve root pain 06/05/2015   Acquired polycythemia 06/05/2015   Anterior knee pain 06/05/2015   Failure of erection 06/05/2015   Gastro-esophageal reflux disease without esophagitis 06/05/2015   Neuropathy 06/05/2015   Angina, class III (HCC) 05/23/2015   Essential hypertension 05/23/2015   Hyperlipidemia 05/23/2015   Inguinal hernia without mention of obstruction or gangrene, recurrent unilateral or unspecified 03/24/2013    Past Surgical History:  Procedure Laterality Date   BACK SURGERY  12/2008   X2-LUMBAR   CARDIAC CATHETERIZATION N/A 05/30/2015   Procedure: Left Heart Cath;  Surgeon: Iran Ouch, MD;  Location: [facility];  Service: Cardiovascular;  Laterality: N/A;   CARDIAC CATHETERIZATION N/A 04/16/2016   Procedure: Left Heart Cath and Coronary Angiography;  Surgeon: Iran Ouch, MD;  Location: [facility];  Service: Cardiovascular;  Laterality: N/A;   COLONOSCOPY  2014   Dr. Evette Cristal   COLONOSCOPY WITH PROPOFOL N/A 12/25/2016   Procedure: COLONOSCOPY WITH PROPOFOL;  Surgeon: Wyline Mood, MD;  Location: [facility];  Service: Endoscopy;  Laterality: N/A;   COLONOSCOPY WITH PROPOFOL N/A 01/29/2017   Procedure: COLONOSCOPY WITH PROPOFOL;  Surgeon: Wyline Mood, MD;  Location: [facility];  Service: Endoscopy;  Laterality: N/A;   COLONOSCOPY WITH PROPOFOL N/A 04/11/2020   Procedure: COLONOSCOPY WITH PROPOFOL;  Surgeon: Wyline Mood, MD;  Location: [facility] ENDOSCOPY;  Service: Gastroenterology;  Laterality: N/A;   CORONARY PRESSURE/FFR STUDY N/A 07/30/2021   Procedure: INTRAVASCULAR PRESSURE WIRE/FFR STUDY;  Surgeon: Iran Ouch, MD;  Location: [facility];  Service: Cardiovascular;   Laterality: N/A;   ESOPHAGEAL MANOMETRY N/A 03/04/2022   Procedure: ESOPHAGEAL MANOMETRY (EM);  Surgeon: Napoleon Form, MD;  Location: [facility] ENDOSCOPY;  Service: Gastroenterology;  Laterality: N/A;   ESOPHAGOGASTRODUODENOSCOPY (EGD) WITH PROPOFOL N/A 01/26/2019   Procedure: ESOPHAGOGASTRODUODENOSCOPY (EGD) WITH PROPOFOL;  Surgeon: Toney Reil, MD;  Location: [facility] ENDOSCOPY;  Service: Gastroenterology;  Laterality: N/A;   ESOPHAGOGASTRODUODENOSCOPY (EGD) WITH PROPOFOL N/A 09/10/2021   Procedure: ESOPHAGOGASTRODUODENOSCOPY (EGD) WITH PROPOFOL;  Surgeon: Toney Reil, MD;  Location: [facility] ENDOSCOPY;  Service: Gastroenterology;  Laterality: N/A;   EVALUATION UNDER ANESTHESIA WITH HEMORRHOIDECTOMY N/A 02/24/2017   Procedure: EXAM UNDER ANESTHESIA WITH POSSIBLE EXCISION OF INTERNAL HEMORRHOIDS;  Surgeon: Henrene Dodge, MD;  Location: [facility] ORS;  Service: General;  Laterality: N/A;   FISSURECTOMY  02/24/2017   Procedure: FISSURECTOMY;  Surgeon: Henrene Dodge, MD;  Location: [facility] ORS;  Service: General;;   HAND SURGERY Left 2020   HERNIA REPAIR Right 1991   INGUINAL HERNIA REPAIR Right 2012   Dr Sherral Hammers HERNIA REPAIR Right 2014   Dr Evette Cristal   LEFT HEART CATH AND CORONARY ANGIOGRAPHY Left 08/05/2020   Procedure: LEFT HEART CATH AND CORONARY ANGIOGRAPHY poss PCI;  Surgeon: Iran Ouch, MD;  Location: [facility];  Service: Cardiovascular;  Laterality: Left;   MUSCLE BIOPSY Left 12/31/2021   Procedure: LEFT VASTUS LATERALIS BIOPSY;  Surgeon: Venetia Night, MD;  Location: [facility] OR[facility];  Service: Neurosurgery;  Laterality: Left;   RIGHT/LEFT HEART CATH AND CORONARY ANGIOGRAPHY N/A 07/30/2021   Procedure: RIGHT/LEFT HEART CATH AND CORONARY ANGIOGRAPHY;  Surgeon: Iran Ouch, MD;  Location: [facility];  Service: Cardiovascular;  Laterality: N/A;   WRIST SURGERY Left    XI ROBOT ASSISTED DIAGNOSTIC LAPAROSCOPY Right 02/03/2023   Procedure: DIAGNOSTIC  LAPAROSCOPY;  Surgeon: Henrene Dodge, MD;  Location: [facility];  Service: General;  Laterality: Right;    Family History  Problem Relation Age of Onset   Hypertension Mother    Heart Problems Mother    High Cholesterol Mother    Heart Problems Father 42       myocardial infarction   Mental illness Brother    Other Maternal Aunt        COVID   Lung cancer Maternal Aunt    Lung cancer Maternal Aunt    Cancer Maternal Uncle        unknown   Prostate cancer Maternal Uncle    Testicular cancer Paternal Uncle    Thyroid disease Daughter    Breast cancer Cousin     Social History   Tobacco Use   Smoking status: Former    Types: Cigars    Start date: 12/28/2002    Quit date: 06/04/2005    Years since quitting: 18.4   Smokeless tobacco: Never   Tobacco comments:    quit 2006-smoked 1 cigar occ  Substance Use Topics   Alcohol use: Not Currently    Comment: has not had a drink in 8 months 02/2021     Current Outpatient Medications:    ACCU-CHEK GUIDE test strip, USE TO TEST BLOOD SUGAR THREE TIMES DAILY AS NEEDED, Disp: 100 strip, Rfl: 1   Accu-Chek Softclix Lancets lancets, TEST TWICE DAILY, Disp: 100 each, Rfl: 3   Alpha Lipoic Acid 200 MG CAPS, Take 200 mg by mouth daily., Disp: , Rfl:    aspirin EC 81 MG tablet, Take by mouth., Disp: , Rfl:    blood glucose meter kit and supplies, 1 each by Other route in the morning and at bedtime. Dispense based on patient and insurance preference. Use up to two  times daily. (FOR ICD-10 E10.9, E11.9)., Disp: 1 each, Rfl: 0   carvedilol (COREG) 6.25 MG tablet, TAKE 1 TABLET(6.25 MG) BY MOUTH TWICE DAILY, Disp: 180 tablet, Rfl: 1   Coenzyme Q10 (COQ-10) 100 MG CAPS, Take 100 mg by mouth daily., Disp: , Rfl:    Dulaglutide (TRULICITY) 0.75 MG/0.5ML SOAJ, Inject 0.75 mg into the skin once a week., Disp: 6 mL, Rfl: 0   fluticasone (FLONASE) 50 MCG/ACT nasal spray, INSTILL 1 SPRAY INTO BOTH NOSTRILS AS NEEDED, Disp: 48 g, Rfl: 0   fluticasone  furoate-vilanterol (BREO ELLIPTA) 100-25 MCG/ACT AEPB, INHALE 1 PUFF INTO THE LUNGS DAILY, Disp: 60 each, Rfl: 6   hydrocortisone 2.5 % cream, Apply to hyperpigmentation on face QD on Monday, Wednesday, and Friday PRN., Disp: 28 g, Rfl: 3   ketoconazole (NIZORAL) 2 % shampoo, Wash scalp/body/face QD on Tuesday, Thursday, and Saturday, let sit 5 minutes before rinsing off, avoid eyes., Disp: 120 mL, Rfl: 11   levOCARNitine (CARNITOR) 330 MG tablet, Take 330 mg by mouth 2 (two) times daily., Disp: , Rfl:    losartan-hydrochlorothiazide (HYZAAR) 50-12.5 MG tablet, Take 1 tablet by mouth daily., Disp: 90 tablet, Rfl: 1   modafinil (PROVIGIL) 100 MG tablet, Take 1 tablet (100 mg total) by mouth daily., Disp:  90 tablet, Rfl: 0   nitroGLYCERIN (NITROSTAT) 0.4 MG SL tablet, Place 1 tablet (0.4 mg total) under the tongue every 5 (five) minutes as needed for chest pain., Disp: , Rfl:    omeprazole (PRILOSEC) 40 MG capsule, Take 1 capsule (40 mg total) by mouth daily., Disp: 90 capsule, Rfl: 1   ranolazine (RANEXA) 1000 MG SR tablet, TAKE 1 TABLET(1000 MG) BY MOUTH TWICE DAILY, Disp: 180 tablet, Rfl: 0   Riboflavin (B-2) 100 MG TABS, Take 100 mg by mouth daily., Disp: , Rfl:    rosuvastatin (CRESTOR) 10 MG tablet, Take 1 tablet (10 mg total) by mouth daily., Disp: 90 tablet, Rfl: 3   sildenafil (VIAGRA) 100 MG tablet, Take 1 tablet (100 mg total) by mouth daily as needed for erectile dysfunction., Disp: 30 tablet, Rfl: 0   Sulfacetamide Sodium 9.8 % SHAM, Shampoo into the face and trunk QD on Monday, Wednesday, and Friday. Let sit a few minutes before washing off., Disp: 120 mL, Rfl: 11   temazepam (RESTORIL) 15 MG capsule, Take 1 capsule (15 mg total) by mouth at bedtime., Disp: 90 capsule, Rfl: 0   Vitamin D, Ergocalciferol, (DRISDOL) 1.25 MG (50000 UNIT) CAPS capsule, Take 1 capsule (50,000 Units total) by mouth every 7 (seven) days., Disp: 12 capsule, Rfl: 1   ezetimibe (ZETIA) 10 MG tablet, Take 1 tablet  (10 mg total) by mouth daily. (Patient taking differently: Take 10 mg by mouth every morning.), Disp: 90 tablet, Rfl: 3  Allergies  Allergen Reactions   Gabapentin Other (See Comments)    Groggy-Mood Changes   Latex Rash   Penicillins Hives and Rash    Tolerated 1st generation cephalosporin (CEFAZOLIN) on 12/31/2021 with no documented ADRs.  PCN reaction causing immediate rash, facial/tongue/throat swelling, SOB or lightheadedness with hypotension: Yes PCN reaction causing severe rash involving mucus membranes or skin necrosis: No PCN reaction that required hospitalization No PCN reaction occurring within the last 10 years: No If all of the above answers are "NO", then may proceed with Cephalosporin use.     I personally reviewed active problem list, medication list, allergies, family history, social history with the patient/caregiver today.   ROS  Ten systems reviewed and is negative except as mentioned in HPI    Objective  Vitals:   11/29/23 0850 11/29/23 0909  BP: (!) 144/90 132/82  Pulse: 75   Resp: 16   Temp: 98.5 F (36.9 C)   TempSrc: Oral   SpO2: 90%   Weight: 257 lb (116.6 kg)   Height: 5\' 11"  (1.803 m)     Body mass index is 35.84 kg/m.  Physical Exam  Constitutional: Patient appears well-developed and well-nourished. Obese  No distress.  HEENT: head atraumatic, normocephalic, pupils equal and reactive to light, neck supple, throat within normal limits Cardiovascular: Normal rate, regular rhythm and normal heart sounds.  No murmur heard. No BLE edema. Pulmonary/Chest: Effort normal and breath sounds normal. No respiratory distress. Muscular skeletal: using a cane, atrophy hands , no tremors  Abdominal: Soft.  There is no tenderness. Psychiatric: Patient has a normal mood and affect. behavior is normal. Judgment and thought content normal.   Recent Results (from the past 2160 hour(s))  POCT glycosylated hemoglobin (Hb A1C)     Status: Abnormal    Collection Time: 11/29/23  9:00 AM  Result Value Ref Range   Hemoglobin A1C 6.4 (A) 4.0 - 5.6 %   HbA1c POC (<> result, manual entry)     HbA1c, POC (prediabetic  range)     HbA1c, POC (controlled diabetic range)       PHQ2/9:    11/29/2023    8:55 AM 10/27/2023   10:27 AM 08/23/2023    9:08 AM 05/11/2023    9:49 AM 04/21/2023   11:17 AM  Depression screen PHQ 2/9  Decreased Interest 0 0 0 0 0  Down, Depressed, Hopeless 0 0 0 0 0  PHQ - 2 Score 0 0 0 0 0  Altered sleeping 0 0 0 0 0  Tired, decreased energy 0 0 0 0 0  Change in appetite 0 0 0 0 0  Feeling bad or failure about yourself  0 0 0 0 0  Trouble concentrating 0 0 0 0 0  Moving slowly or fidgety/restless 0 0 0 0 0  Suicidal thoughts 0 0 0 0 0  PHQ-9 Score 0 0 0 0 0  Difficult doing work/chores Not difficult at all  Not difficult at all      phq 9 is negative   Fall Risk:    11/29/2023    8:49 AM 10/27/2023   10:27 AM 08/23/2023    9:08 AM 05/11/2023    9:49 AM 04/21/2023   11:16 AM  Fall Risk   Falls in the past year? 1 0 1 0 0  Number falls in past yr: 1 0 1 0 0  Injury with Fall? 0 0 0 0 0  Risk for fall due to : Impaired balance/gait No Fall Risks Impaired balance/gait No Fall Risks Impaired balance/gait;No Fall Risks  Follow up Falls prevention discussed;Education provided;Falls evaluation completed Falls prevention discussed Falls prevention discussed;Education provided;Falls evaluation completed Falls prevention discussed Falls prevention discussed   LABS A1c: 6.4% (11/29/2023) A1c: 6.8% (08/23/2023)  RADIOLOGY Cervical spine MRI: Multilevel arthritis, moderate stenosis at C4-C5 (09/2023) Shoulder MRI: Arthritis, degenerative changes in acromioclavicular joint, tenosynovitis of the long head of the biceps (09/2023)  DIAGNOSTIC EEG: Unremarkable (03/2023)  PATHOLOGY Muscle biopsy: Chronic neurogenic atrophy, myopathic change with mitochondrial and lipid abnormalities (12/31/2021)  Functional Status  Survey: Is the patient deaf or have difficulty hearing?: No Does the patient have difficulty seeing, even when wearing glasses/contacts?: No Does the patient have difficulty concentrating, remembering, or making decisions?: No Does the patient have difficulty walking or climbing stairs?: Yes Does the patient have difficulty dressing or bathing?: No Does the patient have difficulty doing errands alone such as visiting a doctor's office or shopping?: No    Assessment & Plan  Assessment and Plan    Type 2 Diabetes Mellitus Improved glycemic control with recent A1c of 6.4% (down from 6.8% in August 2024). Currently on Trulicity 0.75mg  with no reported hypoglycemia. No symptoms of hyperglycemia. -Continue Trulicity 0.75mg . -Monitor weight and blood glucose levels.  Hypertension Initial blood pressure elevated at 144/90, but improved to 132/82. Currently on Carvedilol 6.25mg  and Losartan/HCTZ 50/12.5mg . -Continue current antihypertensive regimen.  Dyslipidemia Currently on Rosuvastatin 10mg  and Ezetimibe. -Continue current lipid-lowering regimen.  Angina No recent chest pain or palpitations reported. -Continue current management with Carvedilol and Ranexa.  Obstructive Sleep Apnea Compliant with BiPAP use, averaging 7 hours per day. Currently on Modafinil for daytime sleepiness. -Continue BiPAP use and Modafinil.  Mitochondrial Myopathy Chronic condition with stable symptoms. -Continue current management under neurology specialist.  Carpal Tunnel Syndrome Bilateral involvement with prior left release and corticosteroid injection in the right. -Continue current management.  Cervical Spondylosis Chronic neck pain with multilevel arthritis and stenosis. -Continue current management.  Shoulder Arthritis Planned corticosteroid injection  for right shoulder. -Follow up with orthopedic specialist for injection.  Gastritis Currently on Omeprazole. -Continue Omeprazole.  Morbid  Obesity Recent weight gain with BMI >35. -Monitor weight. Increase Trulicity dose if weight continues to increase or blood glucose control worsens.  General Health Maintenance / Followup Plans -Continue current medication regimen. -Check blood glucose levels regularly. -Follow up with orthopedic specialist for right shoulder injection. -Follow up with neurology specialist for mitochondrial myopathy management. -Continue BiPAP use for obstructive sleep apnea. -Monitor weight and adjust Trulicity dose as needed.

## 2023-11-26 DIAGNOSIS — G713 Mitochondrial myopathy, not elsewhere classified: Secondary | ICD-10-CM | POA: Diagnosis not present

## 2023-11-29 ENCOUNTER — Ambulatory Visit: Payer: Medicaid Other | Admitting: Family Medicine

## 2023-11-29 ENCOUNTER — Encounter: Payer: Self-pay | Admitting: Family Medicine

## 2023-11-29 VITALS — BP 132/82 | HR 75 | Temp 98.5°F | Resp 16 | Ht 71.0 in | Wt 257.0 lb

## 2023-11-29 DIAGNOSIS — M5412 Radiculopathy, cervical region: Secondary | ICD-10-CM

## 2023-11-29 DIAGNOSIS — G4733 Obstructive sleep apnea (adult) (pediatric): Secondary | ICD-10-CM

## 2023-11-29 DIAGNOSIS — Z7985 Long-term (current) use of injectable non-insulin antidiabetic drugs: Secondary | ICD-10-CM | POA: Diagnosis not present

## 2023-11-29 DIAGNOSIS — E8840 Mitochondrial metabolism disorder, unspecified: Secondary | ICD-10-CM

## 2023-11-29 DIAGNOSIS — I209 Angina pectoris, unspecified: Secondary | ICD-10-CM | POA: Diagnosis not present

## 2023-11-29 DIAGNOSIS — E538 Deficiency of other specified B group vitamins: Secondary | ICD-10-CM

## 2023-11-29 DIAGNOSIS — R569 Unspecified convulsions: Secondary | ICD-10-CM | POA: Diagnosis not present

## 2023-11-29 DIAGNOSIS — E1129 Type 2 diabetes mellitus with other diabetic kidney complication: Secondary | ICD-10-CM

## 2023-11-29 DIAGNOSIS — R809 Proteinuria, unspecified: Secondary | ICD-10-CM

## 2023-11-29 LAB — POCT GLYCOSYLATED HEMOGLOBIN (HGB A1C): Hemoglobin A1C: 6.4 % — AB (ref 4.0–5.6)

## 2023-11-29 MED ORDER — MODAFINIL 100 MG PO TABS: 100.0000 mg | ORAL_TABLET | Freq: Every day | ORAL | 0 refills | Status: DC

## 2023-11-29 MED ORDER — CYANOCOBALAMIN 1000 MCG/ML IJ SOLN
1000.0000 ug | Freq: Once | INTRAMUSCULAR | Status: AC
Start: 2023-11-29 — End: 2023-11-29
  Administered 2023-11-29: 1000 ug via INTRAMUSCULAR

## 2023-11-30 ENCOUNTER — Encounter: Payer: Self-pay | Admitting: Family Medicine

## 2023-11-30 LAB — MICROALBUMIN / CREATININE URINE RATIO
Creatinine, Urine: 151 mg/dL (ref 20–320)
Microalb Creat Ratio: 94 mg/g{creat} — ABNORMAL HIGH (ref <30)
Microalb, Ur: 14.2 mg/dL

## 2023-12-14 DIAGNOSIS — M4802 Spinal stenosis, cervical region: Secondary | ICD-10-CM | POA: Diagnosis not present

## 2023-12-14 DIAGNOSIS — M542 Cervicalgia: Secondary | ICD-10-CM | POA: Diagnosis not present

## 2023-12-14 DIAGNOSIS — M7522 Bicipital tendinitis, left shoulder: Secondary | ICD-10-CM | POA: Diagnosis not present

## 2023-12-14 DIAGNOSIS — M5412 Radiculopathy, cervical region: Secondary | ICD-10-CM | POA: Diagnosis not present

## 2023-12-14 DIAGNOSIS — M25512 Pain in left shoulder: Secondary | ICD-10-CM | POA: Diagnosis not present

## 2023-12-16 NOTE — Telephone Encounter (Signed)
Results abstracted and Care Team updated

## 2023-12-26 DIAGNOSIS — G713 Mitochondrial myopathy, not elsewhere classified: Secondary | ICD-10-CM | POA: Diagnosis not present

## 2023-12-30 ENCOUNTER — Ambulatory Visit: Payer: Medicaid Other

## 2024-01-01 ENCOUNTER — Other Ambulatory Visit: Payer: Self-pay | Admitting: Cardiology

## 2024-01-03 ENCOUNTER — Ambulatory Visit (INDEPENDENT_AMBULATORY_CARE_PROVIDER_SITE_OTHER): Payer: Medicaid Other

## 2024-01-03 ENCOUNTER — Other Ambulatory Visit: Payer: Self-pay | Admitting: Gastroenterology

## 2024-01-03 DIAGNOSIS — E538 Deficiency of other specified B group vitamins: Secondary | ICD-10-CM

## 2024-01-03 DIAGNOSIS — R1319 Other dysphagia: Secondary | ICD-10-CM

## 2024-01-03 DIAGNOSIS — R634 Abnormal weight loss: Secondary | ICD-10-CM

## 2024-01-03 LAB — HM DIABETES EYE EXAM

## 2024-01-03 MED ORDER — CYANOCOBALAMIN 1000 MCG/ML IJ SOLN
1000.0000 ug | Freq: Once | INTRAMUSCULAR | Status: AC
  Administered 2024-01-03: 1000 ug via INTRAMUSCULAR

## 2024-01-03 NOTE — Progress Notes (Signed)
 Patient is in office today for a nurse visit for B12 Injection. Patient Injection was given in the  Right deltoid. Patient tolerated injection well.

## 2024-01-03 NOTE — Telephone Encounter (Signed)
 Last office visit: 01/29/23 with plan to f/u in 6 months. next office visit: none/active recall  Please schedule f/u appt.  Thanks!

## 2024-01-04 ENCOUNTER — Ambulatory Visit: Payer: Medicaid Other | Attending: Medical | Admitting: Medical

## 2024-01-04 ENCOUNTER — Encounter: Payer: Self-pay | Admitting: Medical

## 2024-01-04 VITALS — BP 148/98 | HR 58 | Ht 71.0 in | Wt 262.2 lb

## 2024-01-04 DIAGNOSIS — I25118 Atherosclerotic heart disease of native coronary artery with other forms of angina pectoris: Secondary | ICD-10-CM | POA: Diagnosis not present

## 2024-01-04 DIAGNOSIS — I10 Essential (primary) hypertension: Secondary | ICD-10-CM

## 2024-01-04 DIAGNOSIS — E782 Mixed hyperlipidemia: Secondary | ICD-10-CM

## 2024-01-04 NOTE — Patient Instructions (Addendum)
 Medication Instructions:   Your physician recommends that you continue on your current medications as directed. Please refer to the Current Medication list given to you today.  *If you need a refill on your cardiac medications before your next appointment, please call your pharmacy*   Lab Work: No labs ordered today     Testing/Procedures: No test ordered today    Follow-Up: At [facility], you and your health needs are our priority.  As part of our continuing mission to provide you with exceptional heart care, we have created designated Provider Care Teams.  These Care Teams include your primary Cardiologist (physician) and Advanced Practice Providers (APPs -  Physician Assistants and Nurse Practitioners) who all work together to provide you with the care you need, when you need it.    Your next appointment:   6 month(s)  Provider:   Mikey Fishman, PA-C

## 2024-01-04 NOTE — Telephone Encounter (Signed)
 Last office visit 05/03/2023  Plan Chronic GERD EGD has been normal in the past Advised to decrease omeprazole 40 mg to once daily Okay to stop sucralfate

## 2024-01-04 NOTE — Progress Notes (Signed)
 Cardiology Office Note:    Date:  01/04/2024   ID:  [name] [name], DOB 01-26-1961, MRN [id number]  PCP:  Glenard Mire, MD  [facility] Cardiologist:  Deatrice Cage, MD  [facility] Electrophysiologist:  None   Referring MD: Glenard Mire, MD   Chief Complaint: 1 year follow-up  History of Present Illness:    [name] [name] is a 63 y.o. 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  Cardiac catheterization in June 2016 showed moderate mid LAD stenosis of 50% with an FFR ratio 0.83.  LV systolic function was normal with normal LVEDP.  He had worsening angina in April 2017.  Repeat cardiac cath showed improvement in mid LAD stenosis to 30% and no evidence of obstructive coronary artery disease.  In 2018 the patient had episodes of syncope with unclear etiology.  Echo showed normal EF.  Heart monitor showed no significant arrhythmia.  It was felt episodes were not cardiac in origin.  Most recent cardiac cath in August 2022 showed mild nonobstructive one-vessel CAD with 30% stenosis in the mid LAD.  EF was normal with mildly elevated LVEDP.  Physiology testing was done in the Cath Lab.  FFR of the LAD was mildly abnormal at 0.87 with a CFR 1.8 IMR was normal at 22 indicating normal microvascular function.  He was subsequently diagnosed with mitochondrial myopathy and had significant muscle weakness and atrophy as a result.  He lost weight and has been following up with [facility] for this.  Patient was last seen in February 2024 and was overall stable from a cardiac perspective.  Today, the patient reports due to myopathy they are wanting him to gain weight. Due to weight gain he feels more SOB. He hurt his knee up and is unable to exercise. He eats healthy food, cooks at home. Doesn't eat out a lot. He  was started on cymbalta for nerve pain and feels this elevated his blood pressure. He since quit taking cymbalta and BP is improving. He has rare chest pain when he overexerts himself. Also has SOB with overexertion. No lower leg edema. He uses Bipap at night.   Past Medical History:  Diagnosis Date   Allergy    Angina, class III (HCC)    CAD (coronary artery disease)    a. 05/30/2015 cath: LM nl, mLAD 50% (FFR 0.83), LCx minor irregs, RCA minor irregs, EF 55-65%-->Med Rx; b. 03/2016 Cath: LM nl LAD 57m, D1/2/3 min irregs, LCX min irregs, OM1/2/3 nl, RCA min irregs, RPDA/RPAV/RPL1/RPL2 nl-->Med Rx; b. 07/2020 Cath: LM nl, LAD 79m, D1/2/3 min irregs, LCX min irregs, RCA min irregs, RPDA/RPAV/RPL1,2 nl, EF 55-65%.   Chronic constipation    Degeneration of intervertebral disc of cervical region    Diastolic dysfunction    a.) TTE 07/22/2017: EF 60-65%, G1DD; b.) TTE 02/14/2021: EF 60-65%, G1DD; c.) TTE 01/19/2023: EF 55-60%, AoV sclerosis without stenosis, G1DD   Dyspnea    Erectile dysfunction    a.) on PDE5i (sildenafil )   GERD (gastroesophageal reflux disease)    Hernia 1991   Hyperlipidemia    Hypertension 2008   Mitochondrial myopathy    a.) followed by medical genetics specialist (Dr. Vaughan Neighbours, MD) at [facility]   Nerve root pain    Neuropathy    Obesity, unspecified 2012   OSA on CPAP  Personal history of tobacco use, presenting hazards to health 2012   Polycythemia    Rectal bleeding 02/11/2017   Recurrent Right Inguinal Hernia Repair 02/09/2011, 04/07/2013   Seizure (HCC)    pt states neurologist thinking he was having seizures while sleeping-eeg done 10-2022 and was normal   Sleep difficulties    a.) on BZO (temazepam ) PRN   T2DM (type 2 diabetes mellitus) (HCC)    Umbilical hernia without mention of obstruction or gangrene 02/09/2011   Vitamin D  deficiency     Past Surgical History:  Procedure Laterality Date   BACK SURGERY  12/2008   [facility]   CARDIAC CATHETERIZATION  N/A 05/30/2015   Procedure: Left Heart Cath;  Surgeon: Deatrice DELENA Cage, MD;  Location: [facility] INVASIVE CV LAB;  Service: Cardiovascular;  Laterality: N/A;   CARDIAC CATHETERIZATION N/A 04/16/2016   Procedure: Left Heart Cath and Coronary Angiography;  Surgeon: Deatrice DELENA Cage, MD;  Location: [facility] INV[facility] CV LAB;  Service: Cardiovascular;  Laterality: N/A;   COLONOSCOPY  2014   Dr. Dellie   COLONOSCOPY WITH PROPOFOL  N/A 12/25/2016   Procedure: COLONOSCOPY WITH PROPOFOL ;  Surgeon: Ruel Kung, MD;  Location: [facility] ENDOSCOPY;  Service: Endoscopy;  Laterality: N/A;   COLONOSCOPY WITH PROPOFOL  N/A 01/29/2017   Procedure: COLONOSCOPY WITH PROPOFOL ;  Surgeon: Ruel Kung, MD;  Location: [facility] ENDOSCOPY;  Service: Endoscopy;  Laterality: N/A;   COLONOSCOPY WITH PROPOFOL  N/A 04/11/2020   Procedure: COLONOSCOPY WITH PROPOFOL ;  Surgeon: Kung Ruel, MD;  Location: [facility] ENDOSCOPY;  Service: Gastroenterology;  Laterality: N/A;   CORONARY PRESSURE/FFR STUDY N/A 07/30/2021   Procedure: INTRAVASCULAR PRESSURE WIRE/FFR STUDY;  Surgeon: Cage Deatrice DELENA, MD;  Location: [facility] INV[facility] CV LAB;  Service: Cardiovascular;  Laterality: N/A;   ESOPHAGEAL MANOMETRY N/A 03/04/2022   Procedure: ESOPHAGEAL MANOMETRY (EM);  Surgeon: Shila Gustav GAILS, MD;  Location: [facility] ENDOSCOPY;  Service: Gastroenterology;  Laterality: N/A;   ESOPHAGOGASTRODUODENOSCOPY (EGD) WITH PROPOFOL  N/A 01/26/2019   Procedure: ESOPHAGOGASTRODUODENOSCOPY (EGD) WITH PROPOFOL ;  Surgeon: Unk Corinn Skiff, MD;  Location: [facility] ENDOSCOPY;  Service: Gastroenterology;  Laterality: N/A;   ESOPHAGOGASTRODUODENOSCOPY (EGD) WITH PROPOFOL  N/A 09/10/2021   Procedure: ESOPHAGOGASTRODUODENOSCOPY (EGD) WITH PROPOFOL ;  Surgeon: Unk Corinn Skiff, MD;  Location: [facility] ENDOSCOPY;  Service: Gastroenterology;  Laterality: N/A;   EVALUATION UNDER ANESTHESIA WITH HEMORRHOIDECTOMY N/A 02/24/2017   Procedure: EXAM UNDER ANESTHESIA WITH POSSIBLE EXCISION OF INTERNAL  HEMORRHOIDS;  Surgeon: Aloysius Plant, MD;  Location: [facility] ORS;  Service: General;  Laterality: N/A;   FISSURECTOMY  02/24/2017   Procedure: FISSURECTOMY;  Surgeon: Aloysius Plant, MD;  Location: [facility] ORS;  Service: General;;   HAND SURGERY Left 2020   HERNIA REPAIR Right 1991   INGUINAL HERNIA REPAIR Right 2012   Dr Dellie FONTANA HERNIA REPAIR Right 2014   Dr Dellie   LEFT HEART CATH AND CORONARY ANGIOGRAPHY Left 08/05/2020   Procedure: LEFT HEART CATH AND CORONARY ANGIOGRAPHY poss PCI;  Surgeon: Cage Deatrice DELENA, MD;  Location: [facility];  Service: Cardiovascular;  Laterality: Left;   MUSCLE BIOPSY Left 12/31/2021   Procedure: LEFT VASTUS LATERALIS BIOPSY;  Surgeon: Clois Fret, MD;  Location: [facility] ORS;  Service: Neurosurgery;  Laterality: Left;   RIGHT/LEFT HEART CATH AND CORONARY ANGIOGRAPHY N/A 07/30/2021   Procedure: RIGHT/LEFT HEART CATH AND CORONARY ANGIOGRAPHY;  Surgeon: Cage Deatrice DELENA, MD;  Location: [facility];  Service: Cardiovascular;  Laterality: N/A;   WRIST SURGERY Left    XI ROBOT ASSISTED DIAGNOSTIC LAPAROSCOPY Right 02/03/2023   Procedure:  DIAGNOSTIC LAPAROSCOPY;  Surgeon: Desiderio Schanz, MD;  Location: [facility] ORS;  Service: General;  Laterality: Right;    Current Medications: Current Meds  Medication Sig   ACCU-CHEK GUIDE test strip USE TO TEST BLOOD SUGAR THREE TIMES DAILY AS NEEDED   Accu-Chek Softclix Lancets lancets TEST TWICE DAILY   Alpha Lipoic Acid 200 MG CAPS Take 200 mg by mouth daily.   aspirin  EC 81 MG tablet Take by mouth.   blood glucose meter kit and supplies 1 each by Other route in the morning and at bedtime. Dispense based on patient and insurance preference. Use up to two  times daily. (FOR ICD-10 E10.9, E11.9).   carvedilol  (COREG ) 6.25 MG tablet TAKE 1 TABLET(6.25 MG) BY MOUTH TWICE DAILY   Coenzyme Q10 (COQ-10) 100 MG CAPS Take 100 mg by mouth daily.   Dulaglutide  (TRULICITY ) 0.75 MG/0.5ML SOAJ Inject 0.75 mg into the  skin once a week.   ezetimibe  (ZETIA ) 10 MG tablet Take 1 tablet (10 mg total) by mouth daily. Overdue follow up visit.  PLEASE CALL OFFICE TO SCHEDULE APPOINTMENT PRIOR TO NEXT REFILL   fluticasone  (FLONASE ) 50 MCG/ACT nasal spray INSTILL 1 SPRAY INTO BOTH NOSTRILS AS NEEDED   fluticasone  furoate-vilanterol (BREO ELLIPTA ) 100-25 MCG/ACT AEPB INHALE 1 PUFF INTO THE LUNGS DAILY   hydrocortisone  2.5 % cream Apply to hyperpigmentation on face QD on Monday, Wednesday, and Friday PRN.   ketoconazole  (NIZORAL ) 2 % shampoo Wash scalp/body/face QD on Tuesday, Thursday, and Saturday, let sit 5 minutes before rinsing off, avoid eyes.   levOCARNitine (CARNITOR) 330 MG tablet Take 330 mg by mouth 2 (two) times daily.   losartan -hydrochlorothiazide  (HYZAAR) 50-12.5 MG tablet Take 1 tablet by mouth daily.   modafinil  (PROVIGIL ) 100 MG tablet Take 1 tablet (100 mg total) by mouth daily.   nitroGLYCERIN  (NITROSTAT ) 0.4 MG SL tablet Place 1 tablet (0.4 mg total) under the tongue every 5 (five) minutes as needed for chest pain.   omeprazole  (PRILOSEC) 40 MG capsule TAKE 1 CAPSULE(40 MG) BY MOUTH DAILY   ranolazine  (RANEXA ) 1000 MG SR tablet TAKE 1 TABLET(1000 MG) BY MOUTH TWICE DAILY   Riboflavin (B-2) 100 MG TABS Take 100 mg by mouth daily.   rosuvastatin  (CRESTOR ) 10 MG tablet Take 1 tablet (10 mg total) by mouth daily.   sildenafil  (VIAGRA ) 100 MG tablet Take 1 tablet (100 mg total) by mouth daily as needed for erectile dysfunction.   Sulfacetamide  Sodium 9.8 % SHAM Shampoo into the face and trunk QD on Monday, Wednesday, and Friday. Let sit a few minutes before washing off.   temazepam  (RESTORIL ) 15 MG capsule Take 1 capsule (15 mg total) by mouth at bedtime.   Vitamin D , Ergocalciferol , (DRISDOL ) 1.25 MG (50000 UNIT) CAPS capsule Take 1 capsule (50,000 Units total) by mouth every 7 (seven) days.     Allergies:   Gabapentin , Latex, Penicillins, and Cymbalta [duloxetine hcl]   Social History   Socioeconomic  History   Marital status: Single    Spouse name: Not on file   Number of children: Not on file   Years of education: Not on file   Highest education level: Some college, no degree  Occupational History   Not on file  Tobacco Use   Smoking status: Former    Types: Cigars    Start date: 12/28/2002    Quit date: 06/04/2005    Years since quitting: 18.5   Smokeless tobacco: Never   Tobacco comments:    quit 2006-smoked 1 cigar  occ  Vaping Use   Vaping status: Never Used  Substance and Sexual Activity   Alcohol use: Not Currently    Comment: has not had a drink in 8 months 02/2021   Drug use: No   Sexual activity: Yes    Partners: Female  Other Topics Concern   Not on file  Social History Narrative   Not on file   Social Drivers of Health   Financial Resource Strain: Low Risk  (12/09/2023)   [facility]   Overall Financial Resource Strain (CARDIA)    Difficulty of Paying Living Expenses: Not very hard  Food Insecurity: No Food Insecurity (12/09/2023)   Received [facility]   Hunger Vital Sign    Worried About Running Out of Food in the Last Year: Never true    Ran Out of Food in the Last Year: Never true  Transportation Needs: No Transportation Needs (12/09/2023)   [facility]    In the past 12 months, has lack of transportation kept you from medical appointments or from getting medications?: No    Lack of Transportation (Non-Medical): No  Physical Activity: Insufficiently Active (10/23/2023)   Exercise Vital Sign    Days of Exercise per Week: 3 days    Minutes of Exercise per Session: 30 min  Stress: No Stress Concern Present (10/23/2023)   [employer] - Occupational Stress Questionnaire    Feeling of Stress : Only a little  Social Connections: Moderately Integrated (10/23/2023)   Social Connection and Isolation Panel [NHANES]     Frequency of Communication with Friends and Family: More than three times a week    Frequency of Social Gatherings with Friends and Family: Three times a week    Attends Religious Services: More than 4 times per year    Active Member of Clubs or Organizations: No    Attends Engineer, Structural: More than 4 times per year    Marital Status: Divorced     Family History: The patient's family history includes Breast cancer in his cousin; Cancer in his maternal uncle; Heart Problems in his mother; Heart Problems (age of onset: 90) in his father; High Cholesterol in his mother; Hypertension in his mother; Lung cancer in his maternal aunt and maternal aunt; Mental illness in his brother; Other in his maternal aunt; Prostate cancer in his maternal uncle; Testicular cancer in his paternal uncle; Thyroid  disease in his daughter.  ROS:   Please see the history of present illness.     All other systems reviewed and are negative.  EKGs/Labs/Other Studies Reviewed:    The following studies were reviewed today:  Echo 12/2022 1. Left ventricular ejection fraction, by estimation, is 55 to 60%. The  left ventricle has normal function. The left ventricle has no regional  wall motion abnormalities. There is mild left ventricular hypertrophy.  Left ventricular diastolic parameters  are consistent with Grade I diastolic dysfunction (impaired relaxation).  The average left ventricular global longitudinal strain is -16.6 %.   2. Right ventricular systolic function is normal. The right ventricular  size is normal. Tricuspid regurgitation signal is inadequate for assessing  PA pressure.   3. The mitral valve is normal in structure. No evidence of mitral valve  regurgitation. No evidence of mitral stenosis.   4. The aortic valve is normal in structure. Aortic valve regurgitation is  not visualized. Aortic valve sclerosis is  present, with no evidence of  aortic valve stenosis.   5. The inferior vena  cava is normal in size with greater than 50%  respiratory variability, suggesting right atrial pressure of 3 mmHg.   Heart monitor 12.2023 Patch Wear Time:  12 days and 2 hours (2023-12-09T13:37:58-0500 to 2023-12-21T16:09:21-0500)   Patient had a min HR of 45 bpm, max HR of 124 bpm, and avg HR of 62 bpm. Predominant underlying rhythm was Sinus Rhythm.  Rare PACs and rare PVCs. No significant arrhythmia.  R/L heart cath 2022    Ost LAD to Mid LAD lesion is 10% stenosed.   Mid LAD lesion is 30% stenosed.   1.  Mild nonobstructive coronary artery disease involving the LAD which is mildly calcified.  No other obstructive disease. 2.  Right heart catheterization showed normal filling pressures, normal pulmonary pressure and normal cardiac output.  3.  FFR evaluation of the LAD showed abnormal flow with an FFR of 0.87 and CFR of 1.8.  Not significant enough to require stenting.  IMR was normal at 22 indicating normal microvascular function.   Recommendations: These cardiac findings do not explain the severity of the patient's symptoms. Recommend continuing medical therapy.  EKG:  EKG is ordered today.  The ekg ordered today demonstrates SB 58bpm, no changes  Recent Labs: 05/11/2023: ALT 15; BUN 11; Creat 1.04; Potassium 4.1; Sodium 139  Recent Lipid Panel    Component Value Date/Time   CHOL 127 05/11/2023 1027   CHOL 167 05/08/2016 0803   TRIG 75 05/11/2023 1027   HDL 45 05/11/2023 1027   HDL 52 05/08/2016 0803   CHOLHDL 2.8 05/11/2023 1027   LDLCALC 66 05/11/2023 1027    Physical Exam:    VS:  BP (!) 148/98 (BP Location: Left Arm, Patient Position: Sitting, Cuff Size: Large)   Pulse (!) 58   Ht 5' 11 (1.803 m)   Wt 262 lb 4 oz (119 kg)   SpO2 97%   BMI 36.58 kg/m     Wt Readings from Last 3 Encounters:  01/04/24 262 lb 4 oz (119 kg)  11/29/23 257 lb (116.6 kg)  10/27/23 248 lb (112.5 kg)     GEN:  Well nourished, well developed in no acute distress HEENT:  Normal NECK: No JVD; No carotid bruits LYMPHATICS: No lymphadenopathy CARDIAC: RRR, no murmurs, rubs, gallops RESPIRATORY:  Clear to auscultation without rales, wheezing or rhonchi  ABDOMEN: Soft, non-tender, non-distended MUSCULOSKELETAL:  No edema; No deformity  SKIN: Warm and dry NEUROLOGIC:  Alert and oriented x 3 PSYCHIATRIC:  Normal affect   ASSESSMENT:    1. Coronary artery disease of native artery of native heart with stable angina pectoris (HCC)   2. Hyperlipidemia, mixed   3. Essential hypertension    PLAN:    In order of problems listed above:  Nonobstructive CAD on cath 2022 The patient reports chest pain and DOE with overexertion. Says he has gained weight over the last 2 years (229lbs-> 262lbs) and feels symptoms are from deconditioning. He has trouble exercising due to knee pain at the moment. Says diet is good. No plan for ischemic work-up at this time. Continue ASA, Crestor , Zetia , and Coreg .  HLD LDL 66. Continue Crestor  and Zetia .   HTN BP mildly elevated. Says BP elevated after starting Cymbalta 3-4 weeks ago. He has since stopped the medication and BP is improving. Weight may also be a factor. Continue Coreg  6.25mg  BID and Losartan -hydrochlorothiazide  50-12.5mg  daily. We will continue to monitor BP.  DM2 A1C 6.8. This is followed by PCP.   Disposition: Follow up in 6 month(s) with MD/APP    Signed, Mikesha Migliaccio VEAR Fishman, PA-C  01/04/2024 11:01 AM    [facility]

## 2024-01-05 ENCOUNTER — Other Ambulatory Visit: Payer: Self-pay | Admitting: Family Medicine

## 2024-01-05 DIAGNOSIS — F5101 Primary insomnia: Secondary | ICD-10-CM

## 2024-01-06 DIAGNOSIS — M25512 Pain in left shoulder: Secondary | ICD-10-CM | POA: Diagnosis not present

## 2024-01-07 ENCOUNTER — Encounter: Payer: Self-pay | Admitting: Family Medicine

## 2024-01-10 ENCOUNTER — Encounter: Payer: Self-pay | Admitting: Family Medicine

## 2024-01-10 ENCOUNTER — Encounter: Payer: Self-pay | Admitting: Gastroenterology

## 2024-01-10 DIAGNOSIS — R1319 Other dysphagia: Secondary | ICD-10-CM

## 2024-01-10 DIAGNOSIS — R634 Abnormal weight loss: Secondary | ICD-10-CM

## 2024-01-12 ENCOUNTER — Encounter: Payer: Self-pay | Admitting: Family Medicine

## 2024-01-14 ENCOUNTER — Ambulatory Visit: Payer: Medicaid Other | Admitting: Family Medicine

## 2024-01-14 ENCOUNTER — Telehealth: Payer: Self-pay

## 2024-01-14 ENCOUNTER — Encounter: Payer: Self-pay | Admitting: Family Medicine

## 2024-01-14 VITALS — BP 138/84 | HR 73 | Temp 98.1°F | Resp 16 | Ht 71.0 in | Wt 255.6 lb

## 2024-01-14 DIAGNOSIS — E1165 Type 2 diabetes mellitus with hyperglycemia: Secondary | ICD-10-CM | POA: Diagnosis not present

## 2024-01-14 DIAGNOSIS — Z7985 Long-term (current) use of injectable non-insulin antidiabetic drugs: Secondary | ICD-10-CM

## 2024-01-14 DIAGNOSIS — E669 Obesity, unspecified: Secondary | ICD-10-CM | POA: Diagnosis not present

## 2024-01-14 DIAGNOSIS — T380X5A Adverse effect of glucocorticoids and synthetic analogues, initial encounter: Secondary | ICD-10-CM

## 2024-01-14 DIAGNOSIS — J069 Acute upper respiratory infection, unspecified: Secondary | ICD-10-CM | POA: Diagnosis not present

## 2024-01-14 DIAGNOSIS — Z6835 Body mass index (BMI) 35.0-35.9, adult: Secondary | ICD-10-CM

## 2024-01-14 MED ORDER — INSULIN PEN NEEDLE 32G X 6 MM MISC: 1.0000 | Freq: Every day | 0 refills | Status: AC

## 2024-01-14 MED ORDER — TIRZEPATIDE 5 MG/0.5ML ~~LOC~~ SOAJ: 5.0000 mg | SUBCUTANEOUS | 0 refills | Status: DC

## 2024-01-14 MED ORDER — LANTUS SOLOSTAR 100 UNIT/ML ~~LOC~~ SOPN: 5.0000 [IU] | PEN_INJECTOR | Freq: Every day | SUBCUTANEOUS | 99 refills | Status: AC

## 2024-01-14 NOTE — Progress Notes (Signed)
Name: [name] [name]   MRN: [id number]    DOB: February 05, 1961   Date:01/14/2024       Progress Note  Subjective  Chief Complaint  Chief Complaint  Patient presents with   Diabetes    BS running elevated ever since been on steroid shot average 290s-450s   Discussed the use of AI scribe software for clinical note transcription with the patient, who gave verbal consent to proceed.  History of Present Illness   The patient, with a history of diabetes, presented for a follow-up visit after a recent steroid injection in his left shoulder. He reported a significant increase in his blood glucose levels following the injection, with readings escalating from 113 to 473 over a period of a few days. The patient denied any dietary changes that could have contributed to this increase. He also reported frequent urination, a symptom that has been particularly bothersome.  In addition to his diabetes, the patient has been dealing with an upper respiratory infection for the past three days. He described symptoms of sneezing and facial discomfort, but denied any fever or chills. A recent COVID-19 test was negative.  The patient also mentioned a previous hospitalization due to a severe hyperglycemic episode following a steroid injection. He expressed a strong desire to avoid a similar situation in the future.  Lastly, the patient discussed his current diabetes medication, Trulicity, and expressed interest in a weight loss injection.          Patient Active Problem List   Diagnosis Date Noted   Bilateral carpal tunnel syndrome 05/11/2023   Dyslipidemia associated with type 2 diabetes mellitus (HCC) 05/11/2023   Right groin pain 02/03/2023   Neurogenic muscular atrophy 05/08/2022   Other insomnia 05/08/2022   Controlled type 2 diabetes mellitus with microalbuminuria, without long-term current use of insulin (HCC) 05/08/2022   Contraindication to statin medication 05/08/2022   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HCC)  01/22/2022   Tremor 01/22/2022   Shortness of breath 01/22/2022   Muscle atrophy of lower extremity 11/10/2021   Dysphagia    Asthma 05/14/2021   Multiple thyroid nodules 05/14/2021   Localized, primary osteoarthritis of hand 09/28/2019   Seizure-like activity (HCC) 02/22/2018   Mild cognitive impairment 11/17/2017   Radiculopathy of cervical region 11/17/2017   Stenosis of carotid artery 09/08/2017   Cervical stenosis of spine 09/08/2017   B12 deficiency 06/28/2017   Headache disorder 02/15/2017   Memory loss or impairment 02/15/2017   Coronary artery disease involving native coronary artery of native heart    Radiculitis of left cervical region 03/05/2016   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) 12/05/2015   Patellar subluxation 06/05/2015   Allergic rhinitis, seasonal 06/05/2015   Chronic constipation 06/05/2015   Chronic lower back pain 06/05/2015   Vitamin D deficiency 06/05/2015   Central sleep apnea 06/05/2015   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   Nerve root pain 06/05/2015   Acquired polycythemia 06/05/2015   Anterior knee pain 06/05/2015   Failure of erection 06/05/2015   Gastro-esophageal reflux disease without esophagitis 06/05/2015   Neuropathy 06/05/2015   Angina, class III (HCC) 05/23/2015   Essential hypertension 05/23/2015   Hyperlipidemia 05/23/2015   Inguinal hernia without mention of obstruction or gangrene, recurrent unilateral or unspecified 03/24/2013    Social History   Tobacco Use   Smoking status: Former    Types: Cigars    Start date: 12/28/2002    Quit date: 06/04/2005    Years since quitting: 18.6   Smokeless tobacco:  Never   Tobacco comments:    quit 2006-smoked 1 cigar occ  Substance Use Topics   Alcohol use: Not Currently    Comment: has not had a drink in 8 months 02/2021     Current Outpatient Medications:    ACCU-CHEK GUIDE test strip, USE TO TEST BLOOD SUGAR THREE TIMES DAILY AS NEEDED, Disp: 100 strip, Rfl: 1    Accu-Chek Softclix Lancets lancets, TEST TWICE DAILY, Disp: 100 each, Rfl: 3   Alpha Lipoic Acid 200 MG CAPS, Take 200 mg by mouth daily., Disp: , Rfl:    aspirin EC 81 MG tablet, Take by mouth., Disp: , Rfl:    blood glucose meter kit and supplies, 1 each by Other route in the morning and at bedtime. Dispense based on patient and insurance preference. Use up to two  times daily. (FOR ICD-10 E10.9, E11.9)., Disp: 1 each, Rfl: 0   carvedilol (COREG) 6.25 MG tablet, TAKE 1 TABLET(6.25 MG) BY MOUTH TWICE DAILY, Disp: 180 tablet, Rfl: 1   Coenzyme Q10 (COQ-10) 100 MG CAPS, Take 100 mg by mouth daily., Disp: , Rfl:    Dulaglutide (TRULICITY) 0.75 MG/0.5ML SOAJ, Inject 0.75 mg into the skin once a week., Disp: 6 mL, Rfl: 0   ezetimibe (ZETIA) 10 MG tablet, Take 1 tablet (10 mg total) by mouth daily. Overdue follow up visit.  PLEASE CALL OFFICE TO SCHEDULE APPOINTMENT PRIOR TO NEXT REFILL, Disp: 30 tablet, Rfl: 0   fluticasone (FLONASE) 50 MCG/ACT nasal spray, INSTILL 1 SPRAY INTO BOTH NOSTRILS AS NEEDED, Disp: 48 g, Rfl: 0   fluticasone furoate-vilanterol (BREO ELLIPTA) 100-25 MCG/ACT AEPB, INHALE 1 PUFF INTO THE LUNGS DAILY, Disp: 60 each, Rfl: 6   hydrocortisone 2.5 % cream, Apply to hyperpigmentation on face QD on Monday, Wednesday, and Friday PRN., Disp: 28 g, Rfl: 3   ketoconazole (NIZORAL) 2 % shampoo, Wash scalp/body/face QD on Tuesday, Thursday, and Saturday, let sit 5 minutes before rinsing off, avoid eyes., Disp: 120 mL, Rfl: 11   levOCARNitine (CARNITOR) 330 MG tablet, Take 330 mg by mouth 2 (two) times daily., Disp: , Rfl:    losartan-hydrochlorothiazide (HYZAAR) 50-12.5 MG tablet, Take 1 tablet by mouth daily., Disp: 90 tablet, Rfl: 1   modafinil (PROVIGIL) 100 MG tablet, Take 1 tablet (100 mg total) by mouth daily., Disp: 90 tablet, Rfl: 0   nitroGLYCERIN (NITROSTAT) 0.4 MG SL tablet, Place 1 tablet (0.4 mg total) under the tongue every 5 (five) minutes as needed for chest pain., Disp: , Rfl:     omeprazole (PRILOSEC) 40 MG capsule, TAKE 1 CAPSULE(40 MG) BY MOUTH DAILY, Disp: 90 capsule, Rfl: 1   ranolazine (RANEXA) 1000 MG SR tablet, TAKE 1 TABLET(1000 MG) BY MOUTH TWICE DAILY, Disp: 180 tablet, Rfl: 0   Riboflavin (B-2) 100 MG TABS, Take 100 mg by mouth daily., Disp: , Rfl:    rosuvastatin (CRESTOR) 10 MG tablet, Take 1 tablet (10 mg total) by mouth daily., Disp: 90 tablet, Rfl: 3   sildenafil (VIAGRA) 100 MG tablet, Take 1 tablet (100 mg total) by mouth daily as needed for erectile dysfunction., Disp: 30 tablet, Rfl: 0   Sulfacetamide Sodium 9.8 % SHAM, Shampoo into the face and trunk QD on Monday, Wednesday, and Friday. Let sit a few minutes before washing off., Disp: 120 mL, Rfl: 11   temazepam (RESTORIL) 15 MG capsule, TAKE 1 CAPSULE(15 MG) BY MOUTH AT BEDTIME, Disp: 90 capsule, Rfl: 0   Vitamin D, Ergocalciferol, (DRISDOL) 1.25 MG (50000 UNIT) CAPS  capsule, Take 1 capsule (50,000 Units total) by mouth every 7 (seven) days., Disp: 12 capsule, Rfl: 1  Allergies  Allergen Reactions   Gabapentin Other (See Comments)    Groggy-Mood Changes   Latex Rash   Penicillins Hives and Rash    Tolerated 1st generation cephalosporin (CEFAZOLIN) on 12/31/2021 with no documented ADRs.  PCN reaction causing immediate rash, facial/tongue/throat swelling, SOB or lightheadedness with hypotension: Yes PCN reaction causing severe rash involving mucus membranes or skin necrosis: No PCN reaction that required hospitalization No PCN reaction occurring within the last 10 years: No If all of the above answers are "NO", then may proceed with Cephalosporin use.    Cymbalta [Duloxetine Hcl]     Elevated BP and bad headache    ROS  Ten systems reviewed and is negative except as mentioned in HPI    Objective  Vitals:   01/14/24 1329  BP: 138/84  Pulse: 73  Resp: 16  Temp: 98.1 F (36.7 C)  TempSrc: Oral  SpO2: 97%  Weight: 255 lb 9.6 oz (115.9 kg)  Height: 5\' 11"  (1.803 m)    Body mass  index is 35.65 kg/m.    Physical Exam  Constitutional: Patient appears well-developed and well-nourished. Obese  No distress.  HEENT: head atraumatic, normocephalic, pupils equal and reactive to light, neck supple Cardiovascular: Normal rate, regular rhythm and normal heart sounds.  No murmur heard. No BLE edema. Pulmonary/Chest: Effort normal and breath sounds normal. No respiratory distress. Abdominal: Soft.  There is no tenderness. Psychiatric: Patient has a normal mood and affect. behavior is normal. Judgment and thought content normal.   Recent Results (from the past 2160 hours)  POCT glycosylated hemoglobin (Hb A1C)     Status: Abnormal   Collection Time: 11/29/23  9:00 AM  Result Value Ref Range   Hemoglobin A1C 6.4 (A) 4.0 - 5.6 %   HbA1c POC (<> result, manual entry)     HbA1c, POC (prediabetic range)     HbA1c, POC (controlled diabetic range)    Microalbumin / creatinine urine ratio     Status: Abnormal   Collection Time: 11/29/23 10:06 AM  Result Value Ref Range   Creatinine, Urine 151 20 - 320 mg/dL   Microalb, Ur 52.8 mg/dL    Comment: Reference Range Not established    Microalb Creat Ratio 94 (H) <30 mg/g creat    Comment: . The ADA defines abnormalities in albumin excretion as follows: Marland Kitchen Albuminuria Category        Result (mg/g creatinine) . Normal to Mildly increased   <30 Moderately increased         30-299  Severely increased           > OR = 300 . The ADA recommends that at least two of three specimens collected within a 3-6 month period be abnormal before considering a patient to be within a diagnostic category.   HM DIABETES EYE EXAM     Status: None   Collection Time: 01/03/24 11:13 AM  Result Value Ref Range   HM Diabetic Eye Exam No Retinopathy No Retinopathy    Comment: ABSTRACTED BY HIM     Assessment and Plan    Steroid-induced Hyperglycemia Recent steroid injection in left shoulder led to significant increase in blood glucose  levels, with readings in the 400s for several days. Patient's sensitivity to steroids noted. -Start Lantus insulin today at 5 units, adjusting dose based on fasting glucose levels. -Check fasting glucose levels every morning. -If  glucose above 140 for three consecutive days, increase Lantus by 2 units. -If glucose below 100, decrease Lantus by 2 units. -If glucose remains above 200 after starting Lantus, increase dose by 2 units. -Plan for patient to send fasting glucose results on Monday for further adjustment of insulin dose.  Type 2 Diabetes Mellitus Currently managed with Trulicity, but recent steroid-induced hyperglycemia necessitates adjustment of therapy. -Change from Trulicity to [facility] at 5 units for better control of diabetes. -Continue to monitor blood glucose levels daily.  Upper Respiratory Infection Symptoms for three days, including facial discomfort and frequent sneezing. No fever or cough. COVID test negative. -Recommend over-the-counter medications for symptom relief, including Coricidin HBP, warm salt water gargles, and saline spray. -No antibiotics necessary at this time. -Advise patient to monitor symptoms and report any worsening.  Obesity Patient is morbidly obese with a history of heart disease. -Continue Mounjaro for diabetes control, which may also aid in weight management.  Steroid Injections Patient requires steroid injections for an unspecified condition, but has significant sensitivity leading to hyperglycemia. -Plan for patient to visit clinic prior to future steroid injections to start insulin therapy immediately to prevent hyperglycemia.  Follow-up Monitor response to changes in diabetes management and resolution of upper respiratory symptoms.

## 2024-01-14 NOTE — Patient Instructions (Signed)
Start lantus today at 5 units, check fasting levels every morning if glucose above 140 for 3 days you can go up by 2 units if below 100 you start going down on dose by 2 units   If glucose stays up tomorrow, over 200 fasting you can go up to 7 units and go up or down from there

## 2024-01-14 NOTE — Telephone Encounter (Signed)
PA done through cover my meds: Mounjaro 5MG /0.5ML auto-injectors status: PA Response - ApprovedCreated: January 17th, 2025 [phone]: January 17th, 2025

## 2024-01-17 ENCOUNTER — Ambulatory Visit: Payer: Self-pay

## 2024-01-17 NOTE — Telephone Encounter (Signed)
  Chief Complaint: medication assistance Symptoms: CBG 345 Frequency: several days Pertinent Negatives: NA Disposition: [] ED /[] Urgent Care (no appt availability in office) / [] Appointment(In office/virtual)/ []  [facility]/ [x] Home Care/ [] Refused Recommended Disposition /[] [place] [facility]/ []  Follow-up with PCP Additional Notes: pt states he started out with Lantus 5 units and has increased to 7 as advised from OV notes, pt states CBG still 345 wanting to make sure ok to take dose of Trulicity since he hasn't received Mounjaro yet. Advised pt he is doing Lantus correct and reviewed dosing instructions from OV notes on 01/14/24. Also advised pt that [facility] PA was approved so pharmacy should have ready but since pt has 1 pen left of Trulicity advised he could take that dose and then start Mounjaro next week. Pt verbalized understanding and states all his questions were answered.   Patient Instructions by Alba Cory, MD at 01/14/2024 1:40 PM  Start lantus today at 5 units, check fasting levels every morning if glucose above 140 for 3 days you can go up by 2 units if below 100 you start going down on dose by 2 units     If glucose stays up tomorrow, over 200 fasting you can go up to 7 units and go up or down from there   Summary: Pt has questions and concerns about how to take insulin   Pt has questions and concerns about how he is suppose to be taking his insulin. Cb# [phone]       Reason for Disposition  Caller has medicine question only, adult not sick, AND triager answers question  Answer Assessment - Initial Assessment Questions 1. NAME of MEDICINE: "What medicine(s) are you calling about?"     Lantus and Trulicity  2. QUESTION: "What is your question?" (e.g., double dose of medicine, side effect)     Pt wanting to make sure he doing correct dose of Lantus since CBG still 345, wanting to know if ok to take Trulicity since he hasn't got Mounjaro yet 3.  PRESCRIBER: "Who prescribed the medicine?" Reason: if prescribed by specialist, call should be referred to that group.     Dr. Carlynn Purl  4. SYMPTOMS: "Do you have any symptoms?" If Yes, ask: "What symptoms are you having?"  "How bad are the symptoms (e.g., mild, moderate, severe)     CBG 345  Protocols used: Medication Question Call-A-AH

## 2024-01-20 ENCOUNTER — Ambulatory Visit: Payer: Medicaid Other | Admitting: Family Medicine

## 2024-01-20 ENCOUNTER — Encounter: Payer: Self-pay | Admitting: Family Medicine

## 2024-01-20 VITALS — BP 126/82 | HR 70 | Resp 16 | Ht 71.0 in | Wt 255.6 lb

## 2024-01-20 DIAGNOSIS — E119 Type 2 diabetes mellitus without complications: Secondary | ICD-10-CM

## 2024-01-20 DIAGNOSIS — J069 Acute upper respiratory infection, unspecified: Secondary | ICD-10-CM | POA: Diagnosis not present

## 2024-01-20 DIAGNOSIS — R059 Cough, unspecified: Secondary | ICD-10-CM | POA: Diagnosis not present

## 2024-01-20 DIAGNOSIS — J45901 Unspecified asthma with (acute) exacerbation: Secondary | ICD-10-CM

## 2024-01-20 DIAGNOSIS — Z7985 Long-term (current) use of injectable non-insulin antidiabetic drugs: Secondary | ICD-10-CM

## 2024-01-20 DIAGNOSIS — Z794 Long term (current) use of insulin: Secondary | ICD-10-CM | POA: Diagnosis not present

## 2024-01-20 DIAGNOSIS — J4521 Mild intermittent asthma with (acute) exacerbation: Secondary | ICD-10-CM

## 2024-01-20 DIAGNOSIS — R52 Pain, unspecified: Secondary | ICD-10-CM

## 2024-01-20 DIAGNOSIS — E1129 Type 2 diabetes mellitus with other diabetic kidney complication: Secondary | ICD-10-CM

## 2024-01-20 LAB — POCT INFLUENZA A/B
Influenza A, POC: NEGATIVE
Influenza B, POC: NEGATIVE

## 2024-01-20 MED ORDER — HYDROCOD POLI-CHLORPHE POLI ER 10-8 MG/5ML PO SUER: 5.0000 mL | Freq: Two times a day (BID) | ORAL | 0 refills | Status: DC | PRN

## 2024-01-20 NOTE — Progress Notes (Signed)
Name: [name] [name]   MRN: [id number]    DOB: 1961/12/11   Date:01/20/2024       Progress Note  Subjective  Chief Complaint  Chief Complaint  Patient presents with   Emesis   Nausea   Generalized Body Aches   Nasal Congestion    Discussed the use of AI scribe software for clinical note transcription with the patient, who gave verbal consent to proceed.  History of Present Illness   The patient, with a history of diabetes and cardiovascular complications, presents with a two-day history of cough, runny nose, and facial discomfort described as feeling 'on fire.' The symptoms started with a runny nose, followed by the onset of a cough and facial discomfort. The patient reports chest soreness, likely due to the persistent coughing. He also reports a low-grade fever, with a peak of 100 degrees, and episodes of sweating. The patient denies any change in his baseline shortness of breath.  The patient also reports increased fatigue and headaches, which he attributes to the coughing. He has tried over-the-counter Alka-Seltzer plus with no relief. The patient also reports mild nausea and has had difficulty keeping food down, with soup being regurgitated. He has been maintaining hydration with Pedialyte and reports clear urine output. The patient denies any diarrhea.  The patient's symptoms have worsened since onset, with an episode of breaking out in a cold sweat when lying down. He has been managing his diabetes with Trulicity and reports a recent blood sugar level of 189. He also reports using a Breo inhaler for his asthma. The patient's partner was recently ill and was prescribed codeine cough medicine. The patient is concerned about the possibility of flu or COVID-19.        Patient Active Problem List   Diagnosis Date Noted   Bilateral carpal tunnel syndrome 05/11/2023   Dyslipidemia associated with type 2 diabetes mellitus (HCC) 05/11/2023   Right groin pain 02/03/2023   Neurogenic muscular  atrophy 05/08/2022   Other insomnia 05/08/2022   Controlled type 2 diabetes mellitus with microalbuminuria, without long-term current use of insulin (HCC) 05/08/2022   Contraindication to statin medication 05/08/2022   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HCC) 01/22/2022   Tremor 01/22/2022   Shortness of breath 01/22/2022   Muscle atrophy of lower extremity 11/10/2021   Dysphagia    Asthma 05/14/2021   Multiple thyroid nodules 05/14/2021   Localized, primary osteoarthritis of hand 09/28/2019   Seizure-like activity (HCC) 02/22/2018   Mild cognitive impairment 11/17/2017   Radiculopathy of cervical region 11/17/2017   Stenosis of carotid artery 09/08/2017   Cervical stenosis of spine 09/08/2017   B12 deficiency 06/28/2017   Headache disorder 02/15/2017   Memory loss or impairment 02/15/2017   Coronary artery disease involving native coronary artery of native heart    Radiculitis of left cervical region 03/05/2016   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) 12/05/2015   Patellar subluxation 06/05/2015   Allergic rhinitis, seasonal 06/05/2015   Chronic constipation 06/05/2015   Chronic lower back pain 06/05/2015   Vitamin D deficiency 06/05/2015   Central sleep apnea 06/05/2015   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   Nerve root pain 06/05/2015   Acquired polycythemia 06/05/2015   Anterior knee pain 06/05/2015   Failure of erection 06/05/2015   Gastro-esophageal reflux disease without esophagitis 06/05/2015   Neuropathy 06/05/2015   Angina, class III (HCC) 05/23/2015   Essential hypertension 05/23/2015   Hyperlipidemia 05/23/2015   Inguinal hernia without mention of obstruction or gangrene, recurrent unilateral  or unspecified 03/24/2013    Social History   Tobacco Use   Smoking status: Former    Types: Cigars    Start date: 12/28/2002    Quit date: 06/04/2005    Years since quitting: 18.6   Smokeless tobacco: Never   Tobacco comments:    quit 2006-smoked 1 cigar occ   Substance Use Topics   Alcohol use: Not Currently    Comment: has not had a drink in 8 months 02/2021     Current Outpatient Medications:    ACCU-CHEK GUIDE test strip, USE TO TEST BLOOD SUGAR THREE TIMES DAILY AS NEEDED, Disp: 100 strip, Rfl: 1   Accu-Chek Softclix Lancets lancets, TEST TWICE DAILY, Disp: 100 each, Rfl: 3   Alpha Lipoic Acid 200 MG CAPS, Take 200 mg by mouth daily., Disp: , Rfl:    aspirin EC 81 MG tablet, Take by mouth., Disp: , Rfl:    blood glucose meter kit and supplies, 1 each by Other route in the morning and at bedtime. Dispense based on patient and insurance preference. Use up to two  times daily. (FOR ICD-10 E10.9, E11.9)., Disp: 1 each, Rfl: 0   carvedilol (COREG) 6.25 MG tablet, TAKE 1 TABLET(6.25 MG) BY MOUTH TWICE DAILY, Disp: 180 tablet, Rfl: 1   Coenzyme Q10 (COQ-10) 100 MG CAPS, Take 100 mg by mouth daily., Disp: , Rfl:    ezetimibe (ZETIA) 10 MG tablet, Take 1 tablet (10 mg total) by mouth daily. Overdue follow up visit.  PLEASE CALL OFFICE TO SCHEDULE APPOINTMENT PRIOR TO NEXT REFILL, Disp: 30 tablet, Rfl: 0   fluticasone (FLONASE) 50 MCG/ACT nasal spray, INSTILL 1 SPRAY INTO BOTH NOSTRILS AS NEEDED, Disp: 48 g, Rfl: 0   fluticasone furoate-vilanterol (BREO ELLIPTA) 100-25 MCG/ACT AEPB, INHALE 1 PUFF INTO THE LUNGS DAILY, Disp: 60 each, Rfl: 6   hydrocortisone 2.5 % cream, Apply to hyperpigmentation on face QD on Monday, Wednesday, and Friday PRN., Disp: 28 g, Rfl: 3   insulin glargine (LANTUS SOLOSTAR) 100 UNIT/ML Solostar Pen, Inject 5-10 Units into the skin daily., Disp: 15 mL, Rfl: PRN   Insulin Pen Needle 32G X 6 MM MISC, 1 each by Does not apply route daily at 12 noon., Disp: 100 each, Rfl: 0   ketoconazole (NIZORAL) 2 % shampoo, Wash scalp/body/face QD on Tuesday, Thursday, and Saturday, let sit 5 minutes before rinsing off, avoid eyes., Disp: 120 mL, Rfl: 11   levOCARNitine (CARNITOR) 330 MG tablet, Take 330 mg by mouth 2 (two) times daily., Disp: ,  Rfl:    losartan-hydrochlorothiazide (HYZAAR) 50-12.5 MG tablet, Take 1 tablet by mouth daily., Disp: 90 tablet, Rfl: 1   modafinil (PROVIGIL) 100 MG tablet, Take 1 tablet (100 mg total) by mouth daily., Disp: 90 tablet, Rfl: 0   nitroGLYCERIN (NITROSTAT) 0.4 MG SL tablet, Place 1 tablet (0.4 mg total) under the tongue every 5 (five) minutes as needed for chest pain., Disp: , Rfl:    omeprazole (PRILOSEC) 40 MG capsule, TAKE 1 CAPSULE(40 MG) BY MOUTH DAILY, Disp: 90 capsule, Rfl: 1   ranolazine (RANEXA) 1000 MG SR tablet, TAKE 1 TABLET(1000 MG) BY MOUTH TWICE DAILY, Disp: 180 tablet, Rfl: 0   Riboflavin (B-2) 100 MG TABS, Take 100 mg by mouth daily., Disp: , Rfl:    rosuvastatin (CRESTOR) 10 MG tablet, Take 1 tablet (10 mg total) by mouth daily., Disp: 90 tablet, Rfl: 3   sildenafil (VIAGRA) 100 MG tablet, Take 1 tablet (100 mg total) by mouth daily as  needed for erectile dysfunction., Disp: 30 tablet, Rfl: 0   Sulfacetamide Sodium 9.8 % SHAM, Shampoo into the face and trunk QD on Monday, Wednesday, and Friday. Let sit a few minutes before washing off., Disp: 120 mL, Rfl: 11   temazepam (RESTORIL) 15 MG capsule, TAKE 1 CAPSULE(15 MG) BY MOUTH AT BEDTIME, Disp: 90 capsule, Rfl: 0   tirzepatide (MOUNJARO) 5 MG/0.5ML Pen, Inject 5 mg into the skin once a week. In place of trulicity, Disp: 6 mL, Rfl: 0   Vitamin D, Ergocalciferol, (DRISDOL) 1.25 MG (50000 UNIT) CAPS capsule, Take 1 capsule (50,000 Units total) by mouth every 7 (seven) days., Disp: 12 capsule, Rfl: 1  Allergies  Allergen Reactions   Gabapentin Other (See Comments)    Groggy-Mood Changes   Latex Rash   Penicillins Hives and Rash    Tolerated 1st generation cephalosporin (CEFAZOLIN) on 12/31/2021 with no documented ADRs.  PCN reaction causing immediate rash, facial/tongue/throat swelling, SOB or lightheadedness with hypotension: Yes PCN reaction causing severe rash involving mucus membranes or skin necrosis: No PCN reaction that  required hospitalization No PCN reaction occurring within the last 10 years: No If all of the above answers are "NO", then may proceed with Cephalosporin use.    Cymbalta [Duloxetine Hcl]     Elevated BP and bad headache    ROS  Ten systems reviewed and is negative except as mentioned in HPI    Objective  Vitals:   01/20/24 1110  BP: 126/82  Pulse: 70  Resp: 16  SpO2: 95%  Weight: 255 lb 9.6 oz (115.9 kg)  Height: 5\' 11"  (1.803 m)    Body mass index is 35.65 kg/m.    Physical Exam  Constitutional: Patient appears well-developed and well-nourished. Obese  No distress.  HEENT: head atraumatic, normocephalic, pupils equal and reactive to light,ear TM normal TM , neck supple, throat within normal limits Cardiovascular: Normal rate, regular rhythm and normal heart sounds.  No murmur heard. No BLE edema. Pulmonary/Chest: Effort normal and breath sounds normal. No respiratory distress. Abdominal: Soft.  There is no tenderness. Psychiatric: Patient has a normal mood and affect. behavior is normal. Judgment and thought content normal.   Recent Results (from the past 2160 hours)  POCT glycosylated hemoglobin (Hb A1C)     Status: Abnormal   Collection Time: 11/29/23  9:00 AM  Result Value Ref Range   Hemoglobin A1C 6.4 (A) 4.0 - 5.6 %   HbA1c POC (<> result, manual entry)     HbA1c, POC (prediabetic range)     HbA1c, POC (controlled diabetic range)    Microalbumin / creatinine urine ratio     Status: Abnormal   Collection Time: 11/29/23 10:06 AM  Result Value Ref Range   Creatinine, Urine 151 20 - 320 mg/dL   Microalb, Ur 62.9 mg/dL    Comment: Reference Range Not established    Microalb Creat Ratio 94 (H) <30 mg/g creat    Comment: . The ADA defines abnormalities in albumin excretion as follows: Marland Kitchen Albuminuria Category        Result (mg/g creatinine) . Normal to Mildly increased   <30 Moderately increased         30-299  Severely increased           > OR =  300 . The ADA recommends that at least two of three specimens collected within a 3-6 month period be abnormal before considering a patient to be within a diagnostic category.   HM DIABETES EYE  EXAM     Status: None   Collection Time: 01/03/24 11:13 AM  Result Value Ref Range   HM Diabetic Eye Exam No Retinopathy No Retinopathy    Comment: ABSTRACTED BY HIM  POCT Influenza A/B     Status: None   Collection Time: 01/20/24 11:14 AM  Result Value Ref Range   Influenza A, POC Negative Negative   Influenza B, POC Negative Negative     Assessment and Plan    Upper Respiratory Infection Symptoms of cough, runny nose, facial pressure, and fatigue for two days. Mild fever at home. No change in baseline shortness of breath. Possible exposure from sick girlfriend. -Continue supportive care with rest, hydration, and over-the-counter cough medicine. -Rapid flu in the office negative, COVID test pending, advised may get quicker results if he gets otc rapid test at local pharmacy -If positive for COVID, consider antiviral therapy if results obtained within appropriate window. -Continue using Breo inhaler for underlying asthma.  Nausea/Vomiting Unable to tolerate solid foods, but maintaining hydration with Pedialyte. No diarrhea. -Continue with bland diet and hydration.  Diabetes Recent improvement in blood glucose levels with adjusted insulin dosing. -Continue current  regiment. We will avoid prednisone at this time  -Administer Trulicity 1.5mg  on Monday.  Asthma with acute exacerbation -continue Breo, monitor pulse ox  Plan for follow-up if symptoms do not improve or worsen.

## 2024-01-21 ENCOUNTER — Encounter: Payer: Self-pay | Admitting: Family Medicine

## 2024-01-21 NOTE — Telephone Encounter (Unsigned)
Copied from CRM [id number]. Topic: General - Other >> Jan 21, 2024 10:28 AM Everette C wrote: Reason for CRM: The patient has called to share with the practice that their chlorpheniramine-HYDROcodone (TUSSIONEX) 10-8 MG/5ML [[id number]] prescription will not be covered by their insurance  The patient would like to be prescribed an alternative when possible  Please contact the patient when available

## 2024-01-22 LAB — NOVEL CORONAVIRUS, NAA: SARS-CoV-2, NAA: NOT DETECTED

## 2024-01-22 LAB — SPECIMEN STATUS REPORT

## 2024-01-26 ENCOUNTER — Other Ambulatory Visit: Payer: Self-pay | Admitting: Pulmonary Disease

## 2024-01-26 DIAGNOSIS — G713 Mitochondrial myopathy, not elsewhere classified: Secondary | ICD-10-CM | POA: Diagnosis not present

## 2024-02-03 DIAGNOSIS — M5412 Radiculopathy, cervical region: Secondary | ICD-10-CM | POA: Diagnosis not present

## 2024-02-03 DIAGNOSIS — G713 Mitochondrial myopathy, not elsewhere classified: Secondary | ICD-10-CM | POA: Diagnosis not present

## 2024-02-03 DIAGNOSIS — E54 Ascorbic acid deficiency: Secondary | ICD-10-CM | POA: Diagnosis not present

## 2024-02-03 DIAGNOSIS — E559 Vitamin D deficiency, unspecified: Secondary | ICD-10-CM | POA: Diagnosis not present

## 2024-02-06 ENCOUNTER — Other Ambulatory Visit: Payer: Self-pay | Admitting: Cardiovascular Disease

## 2024-02-09 ENCOUNTER — Encounter: Payer: Self-pay | Admitting: Family Medicine

## 2024-02-09 DIAGNOSIS — G713 Mitochondrial myopathy, not elsewhere classified: Secondary | ICD-10-CM | POA: Diagnosis not present

## 2024-02-10 ENCOUNTER — Other Ambulatory Visit: Payer: Self-pay | Admitting: Family Medicine

## 2024-02-10 DIAGNOSIS — J302 Other seasonal allergic rhinitis: Secondary | ICD-10-CM

## 2024-02-15 ENCOUNTER — Ambulatory Visit: Payer: Medicaid Other | Admitting: Dermatology

## 2024-02-17 ENCOUNTER — Encounter: Payer: Self-pay | Admitting: Family Medicine

## 2024-02-21 ENCOUNTER — Ambulatory Visit: Payer: Medicaid Other | Admitting: Family Medicine

## 2024-02-21 ENCOUNTER — Encounter: Payer: Self-pay | Admitting: Family Medicine

## 2024-02-21 VITALS — BP 118/78 | HR 71 | Temp 97.9°F | Resp 16 | Ht 71.0 in | Wt 246.9 lb

## 2024-02-21 DIAGNOSIS — J0100 Acute maxillary sinusitis, unspecified: Secondary | ICD-10-CM

## 2024-02-21 MED ORDER — AZITHROMYCIN 500 MG PO TABS: 500.0000 mg | ORAL_TABLET | Freq: Every day | ORAL | 0 refills | Status: DC

## 2024-02-21 NOTE — Progress Notes (Signed)
 Name: [name] [name]   MRN: [id number]    DOB: 23-Mar-1961   Date:02/21/2024       Progress Note  Subjective  Chief Complaint  Chief Complaint  Patient presents with   Cough   Nasal Congestion    Runny nose, ongoing since previous visit   Facial Pain    Discussed the use of AI scribe software for clinical note transcription with the patient, who gave verbal consent to proceed.  History of Present Illness   [name] [name] is a 63 year old male with asthma who presents with nasal congestion and sinus pressure.  He has been experiencing nasal congestion and sinus pressure for approximately two and a half to three weeks. Initially, he had a cough in January, for which he was prescribed cough medicine with codeine. The cough improved, but he subsequently developed sneezing and swelling of the eyes upon waking. He describes significant pressure in the right maxillary area of his face. No fever or chills, although he notes sweating consistent with his usual pattern. Occasional pain at the back of the throat, particularly when coughing, but no sore throat at the time of the visit.  He has a history of asthma and continues to use Breo daily in the morning. He mentions wheezing and confirms using his inhaler regularly. He has a rescue inhaler at home but has not use it lately        Patient Active Problem List   Diagnosis Date Noted   Bilateral carpal tunnel syndrome 05/11/2023   Dyslipidemia associated with type 2 diabetes mellitus (HCC) 05/11/2023   Right groin pain 02/03/2023   Neurogenic muscular atrophy 05/08/2022   Other insomnia 05/08/2022   Controlled type 2 diabetes mellitus with microalbuminuria, without long-term current use of insulin (HCC) 05/08/2022   Contraindication to statin medication 05/08/2022   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HCC) 01/22/2022   Tremor 01/22/2022   Shortness of breath 01/22/2022   Muscle atrophy of lower extremity 11/10/2021   Dysphagia    Asthma 05/14/2021    Multiple thyroid nodules 05/14/2021   Localized, primary osteoarthritis of hand 09/28/2019   Seizure-like activity (HCC) 02/22/2018   Mild cognitive impairment 11/17/2017   Radiculopathy of cervical region 11/17/2017   Stenosis of carotid artery 09/08/2017   Cervical stenosis of spine 09/08/2017   B12 deficiency 06/28/2017   Headache disorder 02/15/2017   Memory loss or impairment 02/15/2017   Coronary artery disease involving native coronary artery of native heart    Radiculitis of left cervical region 03/05/2016   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) 12/05/2015   Patellar subluxation 06/05/2015   Allergic rhinitis, seasonal 06/05/2015   Chronic constipation 06/05/2015   Chronic lower back pain 06/05/2015   Vitamin D deficiency 06/05/2015   Central sleep apnea 06/05/2015   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   Nerve root pain 06/05/2015   Acquired polycythemia 06/05/2015   Anterior knee pain 06/05/2015   Failure of erection 06/05/2015   Gastro-esophageal reflux disease without esophagitis 06/05/2015   Neuropathy 06/05/2015   Angina, class III (HCC) 05/23/2015   Essential hypertension 05/23/2015   Hyperlipidemia 05/23/2015   Inguinal hernia without mention of obstruction or gangrene, recurrent unilateral or unspecified 03/24/2013    Social History   Tobacco Use   Smoking status: Former    Types: Cigars    Start date: 12/28/2002    Quit date: 06/04/2005    Years since quitting: 18.7   Smokeless tobacco: Never   Tobacco comments:  quit 2006-smoked 1 cigar occ  Substance Use Topics   Alcohol use: Not Currently    Comment: has not had a drink in 8 months 02/2021     Current Outpatient Medications:    ACCU-CHEK GUIDE test strip, USE TO TEST BLOOD SUGAR THREE TIMES DAILY AS NEEDED, Disp: 100 strip, Rfl: 1   Accu-Chek Softclix Lancets lancets, TEST TWICE DAILY, Disp: 100 each, Rfl: 3   Alpha Lipoic Acid 200 MG CAPS, Take 200 mg by mouth daily., Disp: ,  Rfl:    aspirin EC 81 MG tablet, Take by mouth., Disp: , Rfl:    blood glucose meter kit and supplies, 1 each by Other route in the morning and at bedtime. Dispense based on patient and insurance preference. Use up to two  times daily. (FOR ICD-10 E10.9, E11.9)., Disp: 1 each, Rfl: 0   carvedilol (COREG) 6.25 MG tablet, TAKE 1 TABLET(6.25 MG) BY MOUTH TWICE DAILY, Disp: 180 tablet, Rfl: 1   chlorpheniramine-HYDROcodone (TUSSIONEX) 10-8 MG/5ML, Take 5 mLs by mouth every 12 (twelve) hours as needed for cough., Disp: 115 mL, Rfl: 0   Coenzyme Q10 (COQ-10) 100 MG CAPS, Take 100 mg by mouth daily., Disp: , Rfl:    ezetimibe (ZETIA) 10 MG tablet, TAKE 1 TABLET BY MOUTH EVERY DAY, Disp: 30 tablet, Rfl: 6   fluticasone (FLONASE) 50 MCG/ACT nasal spray, INSTILL 1 SPRAY INTO BOTH NOSTRILS AS NEEDED, Disp: 48 g, Rfl: 0   fluticasone furoate-vilanterol (BREO ELLIPTA) 100-25 MCG/ACT AEPB, INHALE 1 PUFF INTO THE LUNGS DAILY, Disp: 60 each, Rfl: 6   hydrocortisone 2.5 % cream, Apply to hyperpigmentation on face QD on Monday, Wednesday, and Friday PRN., Disp: 28 g, Rfl: 3   insulin glargine (LANTUS SOLOSTAR) 100 UNIT/ML Solostar Pen, Inject 5-10 Units into the skin daily., Disp: 15 mL, Rfl: PRN   Insulin Pen Needle 32G X 6 MM MISC, 1 each by Does not apply route daily at 12 noon., Disp: 100 each, Rfl: 0   ketoconazole (NIZORAL) 2 % shampoo, Wash scalp/body/face QD on Tuesday, Thursday, and Saturday, let sit 5 minutes before rinsing off, avoid eyes., Disp: 120 mL, Rfl: 11   levOCARNitine (CARNITOR) 330 MG tablet, Take 330 mg by mouth 2 (two) times daily., Disp: , Rfl:    losartan-hydrochlorothiazide (HYZAAR) 50-12.5 MG tablet, Take 1 tablet by mouth daily., Disp: 90 tablet, Rfl: 1   modafinil (PROVIGIL) 100 MG tablet, Take 1 tablet (100 mg total) by mouth daily., Disp: 90 tablet, Rfl: 0   nitroGLYCERIN (NITROSTAT) 0.4 MG SL tablet, Place 1 tablet (0.4 mg total) under the tongue every 5 (five) minutes as needed for  chest pain., Disp: , Rfl:    omeprazole (PRILOSEC) 40 MG capsule, TAKE 1 CAPSULE(40 MG) BY MOUTH DAILY, Disp: 90 capsule, Rfl: 1   ranolazine (RANEXA) 1000 MG SR tablet, TAKE 1 TABLET(1000 MG) BY MOUTH TWICE DAILY, Disp: 180 tablet, Rfl: 0   Riboflavin (B-2) 100 MG TABS, Take 100 mg by mouth daily., Disp: , Rfl:    rosuvastatin (CRESTOR) 10 MG tablet, Take 1 tablet (10 mg total) by mouth daily., Disp: 90 tablet, Rfl: 3   sildenafil (VIAGRA) 100 MG tablet, Take 1 tablet (100 mg total) by mouth daily as needed for erectile dysfunction., Disp: 30 tablet, Rfl: 0   Sulfacetamide Sodium 9.8 % SHAM, Shampoo into the face and trunk QD on Monday, Wednesday, and Friday. Let sit a few minutes before washing off., Disp: 120 mL, Rfl: 11   temazepam (RESTORIL) 15  MG capsule, TAKE 1 CAPSULE(15 MG) BY MOUTH AT BEDTIME, Disp: 90 capsule, Rfl: 0   tirzepatide (MOUNJARO) 5 MG/0.5ML Pen, Inject 5 mg into the skin once a week. In place of trulicity, Disp: 6 mL, Rfl: 0   Vitamin D, Ergocalciferol, (DRISDOL) 1.25 MG (50000 UNIT) CAPS capsule, Take 1 capsule (50,000 Units total) by mouth every 7 (seven) days., Disp: 12 capsule, Rfl: 1  Allergies  Allergen Reactions   Gabapentin Other (See Comments)    Groggy-Mood Changes   Latex Rash   Penicillins Hives and Rash    Tolerated 1st generation cephalosporin (CEFAZOLIN) on 12/31/2021 with no documented ADRs.  PCN reaction causing immediate rash, facial/tongue/throat swelling, SOB or lightheadedness with hypotension: Yes PCN reaction causing severe rash involving mucus membranes or skin necrosis: No PCN reaction that required hospitalization No PCN reaction occurring within the last 10 years: No If all of the above answers are "NO", then may proceed with Cephalosporin use.    Cymbalta [Duloxetine Hcl]     Elevated BP and bad headache    ROS  Ten systems reviewed and is negative except as mentioned in HPI    Objective  Vitals:   02/21/24 1116  BP: 118/78   Pulse: 71  Resp: 16  Temp: 97.9 F (36.6 C)  TempSrc: Oral  SpO2: 97%  Weight: 246 lb 14.4 oz (112 kg)  Height: 5\' 11"  (1.803 m)    Body mass index is 34.44 kg/m.    Physical Exam  Constitutional: Patient appears well-developed and well-nourished. Obese  No distress.  HEENT: head atraumatic, normocephalic, pupils equal and reactive to light, ears normal TM, small area of erythema on left soft palate, neck supple, tender during percussion of maxillary sinus, right worse than left side Cardiovascular: Normal rate, regular rhythm and normal heart sounds.  No murmur heard. No BLE edema. Pulmonary/Chest: Effort normal , scattered wheezing. No respiratory distress. Abdominal: Soft.  There is no tenderness. Psychiatric: Patient has a normal mood and affect. behavior is normal. Judgment and thought content normal.   Recent Results (from the past 2160 hours)  POCT glycosylated hemoglobin (Hb A1C)     Status: Abnormal   Collection Time: 11/29/23  9:00 AM  Result Value Ref Range   Hemoglobin A1C 6.4 (A) 4.0 - 5.6 %   HbA1c POC (<> result, manual entry)     HbA1c, POC (prediabetic range)     HbA1c, POC (controlled diabetic range)    Microalbumin / creatinine urine ratio     Status: Abnormal   Collection Time: 11/29/23 10:06 AM  Result Value Ref Range   Creatinine, Urine 151 20 - 320 mg/dL   Microalb, Ur 40.9 mg/dL    Comment: Reference Range Not established    Microalb Creat Ratio 94 (H) <30 mg/g creat    Comment: . The ADA defines abnormalities in albumin excretion as follows: Marland Kitchen Albuminuria Category        Result (mg/g creatinine) . Normal to Mildly increased   <30 Moderately increased         30-299  Severely increased           > OR = 300 . The ADA recommends that at least two of three specimens collected within a 3-6 month period be abnormal before considering a patient to be within a diagnostic category.   HM DIABETES EYE EXAM     Status: None   Collection Time:  01/03/24 11:13 AM  Result Value Ref Range   HM Diabetic Eye  Exam No Retinopathy No Retinopathy    Comment: ABSTRACTED BY HIM  Novel Coronavirus, NAA ([facility])     Status: None   Collection Time: 01/20/24 12:00 AM   Specimen: Nasopharyngeal(NP) swabs in vial transport medium   Nasopharynge  Previous  Result Value Ref Range   SARS-CoV-2, NAA Not Detected Not Detected    Comment: This nucleic acid amplification test was developed and its performance characteristics determined by [employer]. Nucleic acid amplification tests include RT-PCR and TMA. This test has not been FDA cleared or approved. This test has been authorized by FDA under an Emergency Use Authorization (EUA). This test is only authorized for the duration of time the declaration that circumstances exist justifying the authorization of the emergency use of in vitro diagnostic tests for detection of SARS-CoV-2 virus and/or diagnosis of COVID-19 infection under section 564(b)(1) of the Act, 21 U.S.C. 010UVO-5(D) (1), unless the authorization is terminated or revoked sooner. When diagnostic testing is negative, the possibility of a false negative result should be considered in the context of a patient's recent exposures and the presence of clinical signs and symptoms consistent with COVID-19. An individual without symptoms of COVID-19 and who is not shedding SARS-CoV-2 virus wo uld expect to have a negative (not detected) result in this assay.   Specimen status report     Status: None   Collection Time: 01/20/24 12:00 AM  Result Value Ref Range   specimen status report Comment     Comment: Please note Please note The date and/or time of collection was not indicated on the requisition as required by state and federal law.  The date of receipt of the specimen was used as the collection date if not supplied.   POCT Influenza A/B     Status: None   Collection Time: 01/20/24 11:14 AM  Result Value Ref Range    Influenza A, POC Negative Negative   Influenza B, POC Negative Negative     Assessment and Plan    Acute Right Maxillary Sinusitis Facial pressure for approximately 2.5-3 weeks. No fever or chills. Noted erythema in the oropharynx. -Prescribe Azithromycin 500mg  daily for 3 days. -Advise use of saline nasal spray to decrease swelling and facilitate drainage. -Recommend Tylenol for pain management.  Asthma Audible wheezing on examination. Patient reports daily use of Breo. -Continue Breo as prescribed. -Advise use of rescue inhaler up to 4 times a day. -If symptoms do not improve, patient to notify the office.

## 2024-02-22 ENCOUNTER — Ambulatory Visit: Payer: Medicaid Other | Admitting: Dermatology

## 2024-02-22 ENCOUNTER — Encounter: Payer: Self-pay | Admitting: Dermatology

## 2024-02-22 DIAGNOSIS — L819 Disorder of pigmentation, unspecified: Secondary | ICD-10-CM | POA: Diagnosis not present

## 2024-02-22 DIAGNOSIS — B360 Pityriasis versicolor: Secondary | ICD-10-CM | POA: Diagnosis not present

## 2024-02-22 DIAGNOSIS — Z79899 Other long term (current) drug therapy: Secondary | ICD-10-CM

## 2024-02-22 DIAGNOSIS — L739 Follicular disorder, unspecified: Secondary | ICD-10-CM | POA: Diagnosis not present

## 2024-02-22 DIAGNOSIS — L811 Chloasma: Secondary | ICD-10-CM

## 2024-02-22 DIAGNOSIS — Z7189 Other specified counseling: Secondary | ICD-10-CM

## 2024-02-22 DIAGNOSIS — L700 Acne vulgaris: Secondary | ICD-10-CM

## 2024-02-22 MED ORDER — HYDROCORTISONE 2.5 % EX CREA: TOPICAL_CREAM | CUTANEOUS | 3 refills | Status: DC

## 2024-02-22 MED ORDER — KETOCONAZOLE 2 % EX SHAM: MEDICATED_SHAMPOO | CUTANEOUS | 11 refills | Status: DC

## 2024-02-22 NOTE — Progress Notes (Signed)
 Follow-Up Visit   Subjective  [name] [name] is a 63 y.o. male who presents for the following: 6 month follow up Tinea Versicolor at scalp and shoulders. Currently using ketoconazole shampoo 3 days weekly and Josef's Sulfur Soap, hyperpigmentation of shoulders and face currently using hydrocortisone 2.5 % cream to affected areas 3 nights weekly and Acne at shoulders currently using ketoconazole shampoo 3 days week as body wash.  Patient reports he has noticed he gets flared after taking steroid shot for pinched nerve in neck and back.   The patient has spots, moles and lesions to be evaluated, some may be new or changing and the patient may have concern these could be cancer.  The following portions of the chart were reviewed this encounter and updated as appropriate: medications, allergies, medical history  Review of Systems:  No other skin or systemic complaints except as noted in HPI or Assessment and Plan.  Objective  Well appearing patient in no apparent distress; mood and affect are within normal limits.  A focused examination was performed of the following areas: Face, scalp, shoulders  Relevant exam findings are noted in the Assessment and Plan.                   Assessment & Plan   Hyperpigmentation of the face  2ndary to eczema vs Seb Derm with possible component of Melasma   Chronic and persistent condition with duration or expected duration over one year. Condition is improving with treatment but not currently at goal. Pt has noticed some improvement with Hydrocortisone treatment  Melasma is a chronic; persistent condition of hyperpigmented patches generally on the face, worse in summer due to higher UV exposure.    Heredity; thyroid disease; sun exposure; pregnancy; birth control pills; epilepsy medication and darker skin may predispose to Melasma.   Recommendations include: - Sun avoidance and daily broad spectrum (UVA/UVB) tinted mineral sunscreen  SPF 30+, with Zinc or Titanium Dioxide. - Rx topical bleaching creams (i.e. hydroquinone) is a common treatment but should not be used long term.  Hydroquinones may be mixed with retinoids; vitamin C; steroids; Kojic Acid. - Alastin A-luminate, retinoids, vitamin C, topical tranexamic acid, glycolic acid and kojic acid can be used for brightening while on break from hydroquinone - Rx Azelaic Acid is also a treatment option that is safe for pregnancy (Category B). - OTC Heliocare can be helpful in control and prevention. - Oral Rx with Tranexamic Acid 250 mg - 650 mg po daily can be used for moderate to severe cases especially during summer (contraindications include pregnancy; lactation; hx of PE; hx of DVT; clotting disorder; heart disease; anticoagulant use and upcoming long trips)   - Chemical peels (would need multiple for best result).  - Lasers and  Microdermabrasion may also be helpful adjunct treatments.  Exam: Confluent hyperpigmentation from cheeks down to mandible  See photos  Plan:  Continue HC 2.5% cream apply topically to dark areas at face and shoulders QHS on M,W, F PRN.  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 See photos    Chronic and persistent condition with duration or expected duration over one year. Condition is improving with  treatment but not currently at goal. Treatment Plan: Continue Ketoconazole 2 % shampoo alternating with Josef's Sulfur soap as body wash to affected areas 3 days weekly.  ACNE VULGARIS   TINEA VERSICOLOR   Related Medications ketoconazole (NIZORAL) 2 % shampoo Wash scalp/body/face QD on Tuesday, Thursday,  and Saturday, let sit 5 minutes before rinsing off, avoid eyes. FOLLICULITIS   MELASMA   COUNSELING AND COORDINATION OF CARE   MEDICATION MANAGEMENT   DYSCHROMIA    Return for 6 - 8 month follow up acne/folliculitis/ tinea ver.  IAsher Muir, CMA, am acting as scribe for Armida Sans, MD.   Documentation: I have reviewed the above documentation for accuracy and completeness, and I agree with the above.  Armida Sans, MD

## 2024-02-22 NOTE — Patient Instructions (Addendum)
 Continue hydrocortisone cream 2.5 % to dark areas at face on M, W, F as needed for hyperpigmentation  Continue Ketoconazole shampoo - use topically as body wash alternating with Josep's bar soap  3 days weekly        Due to recent changes in healthcare laws, you may see results of your pathology and/or laboratory studies on MyChart before the doctors have had a chance to review them. We understand that in some cases there may be results that are confusing or concerning to you. Please understand that not all results are received at the same time and often the doctors may need to interpret multiple results in order to provide you with the best plan of care or course of treatment. Therefore, we ask that you please give [place] 2 business days to thoroughly review all your results before contacting the office for clarification. Should we see a critical lab result, you will be contacted sooner.   If You Need Anything After Your Visit  If you have any questions or concerns for your doctor, please call our main line at [phone] and press option 4 to reach your doctor's medical assistant. If no one answers, please leave a voicemail as directed and we will return your call as soon as possible. Messages left after 4 pm will be answered the following business day.   You may also send [place] a message via MyChart. We typically respond to MyChart messages within 1-2 business days.  For prescription refills, please ask your pharmacy to contact our office. Our fax number is [phone].  If you have an urgent issue when the clinic is closed that cannot wait until the next business day, you can page your doctor at the number below.    Please note that while we do our best to be available for urgent issues outside of office hours, we are not available 24/7.   If you have an urgent issue and are unable to reach [place], you may choose to seek medical care at your doctor's office, retail clinic, urgent care center, or  emergency room.  If you have a medical emergency, please immediately call 911 or go to the emergency department.  Pager Numbers  - Dr. Gwen Pounds: [phone]  - Dr. Roseanne Reno: [phone]  - Dr. Katrinka Blazing: [phone]   In the event of inclement weather, please call our main line at [phone] for an update on the status of any delays or closures.  Dermatology Medication Tips: Please keep the boxes that topical medications come in in order to help keep track of the instructions about where and how to use these. Pharmacies typically print the medication instructions only on the boxes and not directly on the medication tubes.   If your medication is too expensive, please contact our office at [phone] option 4 or send [place] a message through MyChart.   We are unable to tell what your co-pay for medications will be in advance as this is different depending on your insurance coverage. However, we may be able to find a substitute medication at lower cost or fill out paperwork to get insurance to cover a needed medication.   If a prior authorization is required to get your medication covered by your insurance company, please allow [place] 1-2 business days to complete this process.  Drug prices often vary depending on where the prescription is filled and some pharmacies may offer cheaper prices.  The website www.goodrx.com contains coupons for medications through different pharmacies. The prices here do not  account for what the cost may be with help from insurance (it may be cheaper with your insurance), but the website can give you the price if you did not use any insurance.  - You can print the associated coupon and take it with your prescription to the pharmacy.  - You may also stop by our office during regular business hours and pick up a GoodRx coupon card.  - If you need your prescription sent electronically to a different pharmacy, notify our office through [facility] or by phone at  [phone] option 4.     Si Usted Necesita Algo Despus de Su Visita  Tambin puede enviarnos un mensaje a travs de Clinical cytogeneticist. Por lo general respondemos a los mensajes de MyChart en el transcurso de 1 a 2 das hbiles.  Para renovar recetas, por favor pida a su farmacia que se ponga en contacto con nuestra oficina. Annie Sable de fax es [place] [phone].  Si tiene un asunto urgente cuando la clnica est cerrada y que no puede esperar hasta el siguiente da hbil, puede llamar/localizar a su doctor(a) al nmero que aparece a continuacin.   Por favor, tenga en cuenta que aunque hacemos todo lo posible para estar disponibles para asuntos urgentes fuera del horario de [place], no estamos disponibles las 24 horas del da, los 7 [address]  [address] de la [place].   Si tiene un problema urgente y no puede comunicarse con nosotros, puede optar por buscar atencin mdica  en el consultorio de su doctor(a), en una clnica privada, en un centro de atencin urgente o en una sala de emergencias.  Si tiene Engineer, drilling, por favor llame inmediatamente al 911 o vaya a la sala de emergencias.  Nmeros de bper  - Dr. Gwen Pounds: [phone]  - Dra. Roseanne Reno: [phone]  - Dr. Katrinka Blazing: [phone]   En caso de inclemencias del tiempo, por favor llame a Lacy Duverney principal al [phone] para una actualizacin sobre el [place] de cualquier retraso o cierre.  Consejos para la medicacin en dermatologa: Por favor, guarde las cajas en las que vienen los medicamentos de uso tpico para ayudarle a seguir las instrucciones sobre dnde y cmo usarlos. Las farmacias generalmente imprimen las instrucciones del medicamento slo en las cajas y no directamente en los tubos del [place].   Si su medicamento es muy caro, por favor, pngase en contacto con Rolm Gala llamando al [phone] y presione la opcin 4 o envenos un mensaje a travs de Clinical cytogeneticist.   No podemos decirle cul ser su copago por los  medicamentos por adelantado ya que esto es diferente dependiendo de la cobertura de su seguro. Sin embargo, es posible que podamos encontrar un medicamento sustituto a Audiological scientist un formulario para que el seguro cubra el medicamento que se considera necesario.   Si se requiere una autorizacin previa para que su compaa de seguros [place] su medicamento, por favor permtanos de 1 a 2 das hbiles para completar [address]  Los precios de los medicamentos varan con frecuencia dependiendo del Environmental consultant de dnde se surte la receta y alguna farmacias pueden ofrecer precios ms baratos.  El sitio web www.goodrx.com tiene cupones para medicamentos de Health and safety inspector. Los precios aqu no tienen en cuenta lo que podra costar con la ayuda del seguro (puede ser ms barato con su seguro), pero el sitio web puede darle el precio si no utiliz Tourist information centre manager.  - Puede imprimir el cupn correspondiente y llevarlo con su receta a la farmacia.  -  Tambin puede pasar por nuestra oficina durante el horario de atencin regular y Education officer, museum una tarjeta de cupones de GoodRx.  - Si necesita que su receta se enve electrnicamente a una farmacia diferente, informe a nuestra oficina a travs de MyChart de [place] o por telfono llamando al [phone] y presione la opcin 4.

## 2024-02-25 DIAGNOSIS — G713 Mitochondrial myopathy, not elsewhere classified: Secondary | ICD-10-CM | POA: Diagnosis not present

## 2024-02-27 ENCOUNTER — Encounter: Payer: Self-pay | Admitting: Family Medicine

## 2024-02-28 ENCOUNTER — Ambulatory Visit: Payer: Medicaid Other | Admitting: Family Medicine

## 2024-02-28 ENCOUNTER — Encounter: Payer: Self-pay | Admitting: Family Medicine

## 2024-02-28 ENCOUNTER — Ambulatory Visit
Admission: RE | Admit: 2024-02-28 | Discharge: 2024-02-28 | Disposition: A | Discharge status: Home / Self Care | Source: Ambulatory Visit | Attending: Family Medicine | Admitting: Family Medicine

## 2024-02-28 ENCOUNTER — Ambulatory Visit
Admission: RE | Admit: 2024-02-28 | Discharge: 2024-02-28 | Disposition: A | Discharge status: Home / Self Care | Attending: Family Medicine | Admitting: Family Medicine

## 2024-02-28 VITALS — BP 132/80 | HR 67 | Resp 16 | Ht 71.0 in | Wt 253.4 lb

## 2024-02-28 DIAGNOSIS — E1159 Type 2 diabetes mellitus with other circulatory complications: Secondary | ICD-10-CM

## 2024-02-28 DIAGNOSIS — M1712 Unilateral primary osteoarthritis, left knee: Secondary | ICD-10-CM | POA: Diagnosis not present

## 2024-02-28 DIAGNOSIS — E1129 Type 2 diabetes mellitus with other diabetic kidney complication: Secondary | ICD-10-CM | POA: Diagnosis not present

## 2024-02-28 DIAGNOSIS — I152 Hypertension secondary to endocrine disorders: Secondary | ICD-10-CM

## 2024-02-28 DIAGNOSIS — E8840 Mitochondrial metabolism disorder, unspecified: Secondary | ICD-10-CM | POA: Diagnosis not present

## 2024-02-28 DIAGNOSIS — R809 Proteinuria, unspecified: Secondary | ICD-10-CM

## 2024-02-28 DIAGNOSIS — I209 Angina pectoris, unspecified: Secondary | ICD-10-CM | POA: Diagnosis not present

## 2024-02-28 DIAGNOSIS — M25462 Effusion, left knee: Secondary | ICD-10-CM | POA: Diagnosis present

## 2024-02-28 DIAGNOSIS — E538 Deficiency of other specified B group vitamins: Secondary | ICD-10-CM | POA: Diagnosis not present

## 2024-02-28 DIAGNOSIS — R569 Unspecified convulsions: Secondary | ICD-10-CM

## 2024-02-28 DIAGNOSIS — F5101 Primary insomnia: Secondary | ICD-10-CM

## 2024-02-28 DIAGNOSIS — I10 Essential (primary) hypertension: Secondary | ICD-10-CM

## 2024-02-28 DIAGNOSIS — Z9181 History of falling: Secondary | ICD-10-CM

## 2024-02-28 LAB — POCT GLYCOSYLATED HEMOGLOBIN (HGB A1C): Hemoglobin A1C: 8.2 % — AB (ref 4.0–5.6)

## 2024-02-28 MED ORDER — EZETIMIBE 10 MG PO TABS: 10.0000 mg | ORAL_TABLET | Freq: Every day | ORAL | 1 refills | Status: DC

## 2024-02-28 MED ORDER — LOSARTAN POTASSIUM-HCTZ 50-12.5 MG PO TABS
1.0000 | ORAL_TABLET | Freq: Every day | ORAL | 1 refills | Status: DC
Start: 2024-02-28 — End: 2024-09-04

## 2024-02-28 MED ORDER — TEMAZEPAM 15 MG PO CAPS: 15.0000 mg | ORAL_CAPSULE | Freq: Every day | ORAL | 0 refills | Status: DC

## 2024-02-28 MED ORDER — CYANOCOBALAMIN 1000 MCG/ML IJ SOLN
1000.0000 ug | Freq: Once | INTRAMUSCULAR | Status: AC
  Administered 2024-02-28: 1000 ug via INTRAMUSCULAR

## 2024-02-28 MED ORDER — ROSUVASTATIN CALCIUM 10 MG PO TABS: 10.0000 mg | ORAL_TABLET | Freq: Every day | ORAL | 1 refills | Status: DC

## 2024-02-28 NOTE — Progress Notes (Signed)
 Name: [name] [name]   MRN: [id number]    DOB: Jan 08, 1961   Date:02/28/2024       Progress Note  Subjective  Chief Complaint  Chief Complaint  Patient presents with   Medical Management of Chronic Issues   Knee Injury    Left knee, slipped fell on Sat   HPI   DMII:  he had lost a lot of weight and glucose was dropping, was off medications for months, however with every cortisone injection glucose goes up, we had to resume Trulicity Fall 2024, he was doing well until early January when he steroid injection on left shoulder and glucose spike to 475, stayed up for days, we had to add Lantus and switched to [facility] due to weight gain . He states about one month ago glucose normalized and he has been off Lantus, taking Mounjaro 5 mg, does not eat much and glucose has been in the low 100's. He has been out of statin therapy ( no refills at pharmacy) but taking Zetia for CAD and dyslipidemia, ARB for microalbuminuria. HTN is under control but weight is going up   Mitochondrial disease   going to [facility] and seeing neuromuscular sub-specialist. He has neuromuscular atrophy and had significant weight loss due to muscle mass loss .  He also has dysphagia but stable now . Symptoms are stable, he has some tremors intermittently and still uses a cane , he has frequent falls. Last fall was two days ago. He was walking in his kitchen , lost his balance and twisted and fell on left knee, he blocked his fall with left hand He is taking Tylenol pain level is 8/10 on left knee and shoulder is aggravated also . Advised to use Voltaren gel   A. 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 See comment. - Mild, chronic neurogenic atrophy.    Constipation: he has been taking dulcolax and milk of magnesia prn, he states Amitiza did not work , he continues to have high fiber diet, bowel movements about once  every 2-3 days    HTN/Angina : He is taking carvedilol and Ranexa, off Imdur due to hypotension, he takes Zetia and back on Rosuvastatin for the past year . Denies recent chest pain or palpitation but has noticed increase in SOB again   Cardiac cath done 07/2021     Ost LAD to Mid LAD lesion is 10% stenosed.   Mid LAD lesion is 30% stenosed     SOB: going on since Fall 2021 - he has been evaluated by pulmonologist and cardiologist and no answers yet, cardiac cath unremarkable, also negative  CT chest. 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 He was admitted to [facility] from 04/18 to 04/21 for EEG with video and it looked negative for seizure . He is under the care of [facility]    Study from 03/2023: No nocturnal seizure like events identified--EEG was unremarkable and without interictal activity. Spells captured cannot be characterized, but there is no evidence of a forme pleine epileptic process.  Low B12: he is getting B12 injections  monthly in our office . He will get it today    OSA: he has a new Bi-PAP machine and is compliant however continues to feel tired all day, I gave Modafinil but he decided not to take it    Cervical left radiculopathy: seen by Dr. Alfredo Batty and PT advised since his SOB and recent health issues makes him a poor candidate for surgery at this time.He saw Dr. Edwinna Areola and was referred to pain medicine - Dr. Cherylann Ratel , but he decided not to go back Currently only takes Tylenol for pain    Imaging: MRI C spine 08/26/2021 IMPRESSION:  1. Multilevel spondylosis of the cervical spine as described.  2. Moderate foraminal narrowing bilaterally at  C2-3, right greater  than left.  3. Moderate right mild left foraminal narrowing at C3-4.  4. Moderate foraminal narrowing bilaterally at C5-6 and C6-7 is  worse on the right.  5. Mild central canal narrowing at C6-7.      Enlarged thyroid: CT done 03/2021 at [facility], showed goiter and compression of trachea , TSH has been at goal .    History of gastritis: seen by Dr. Allegra Lai, advised to continue omeprazole once a day, no longer on sucralfate. He continue to have bloating, and is going to see GI soon. He states symptoms before starting [facility]   Patient Active Problem List   Diagnosis Date Noted   Bilateral carpal tunnel syndrome 05/11/2023   Dyslipidemia associated with type 2 diabetes mellitus (HCC) 05/11/2023   Right groin pain 02/03/2023   Neurogenic muscular atrophy 05/08/2022   Other insomnia 05/08/2022   Controlled type 2 diabetes mellitus with microalbuminuria, without long-term current use of insulin (HCC) 05/08/2022   Contraindication to statin medication 05/08/2022   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HCC) 01/22/2022   Tremor 01/22/2022   Shortness of breath 01/22/2022   Muscle atrophy of lower extremity 11/10/2021   Dysphagia    Asthma 05/14/2021   Multiple thyroid nodules 05/14/2021   Localized, primary osteoarthritis of hand 09/28/2019   Seizure-like activity (HCC) 02/22/2018   Mild cognitive impairment 11/17/2017   Radiculopathy of cervical region 11/17/2017   Stenosis of carotid artery 09/08/2017   Cervical stenosis of spine 09/08/2017   B12 deficiency 06/28/2017   Headache disorder 02/15/2017   Memory loss or impairment 02/15/2017   Coronary artery disease involving native coronary artery of native heart    Radiculitis of left cervical region 03/05/2016   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) 12/05/2015   Patellar subluxation 06/05/2015   Allergic rhinitis, seasonal 06/05/2015   Chronic constipation 06/05/2015   Chronic lower back pain  06/05/2015   Vitamin D deficiency 06/05/2015   Central sleep apnea 06/05/2015   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   Nerve root pain 06/05/2015   Acquired polycythemia 06/05/2015   Anterior knee pain 06/05/2015   Failure of erection 06/05/2015   Gastro-esophageal reflux disease without esophagitis 06/05/2015   Neuropathy 06/05/2015   Angina, class III (HCC) 05/23/2015   Essential hypertension 05/23/2015   Hyperlipidemia 05/23/2015   Inguinal hernia without mention of obstruction or gangrene, recurrent unilateral or unspecified 03/24/2013    Past Surgical History:  Procedure Laterality Date   BACK SURGERY  12/2008   X2-LUMBAR   CARDIAC CATHETERIZATION N/A 05/30/2015   Procedure: Left Heart Cath;  Surgeon: Iran Ouch, MD;  Location: [facility] INVASIVE CV LAB;  Service: Cardiovascular;  Laterality: N/A;   CARDIAC CATHETERIZATION N/A 04/16/2016   Procedure: Left  Heart Cath and Coronary Angiography;  Surgeon: Iran Ouch, MD;  Location: [facility];  Service: Cardiovascular;  Laterality: N/A;   COLONOSCOPY  2014   Dr. Evette Cristal   COLONOSCOPY WITH PROPOFOL N/A 12/25/2016   Procedure: COLONOSCOPY WITH PROPOFOL;  Surgeon: Wyline Mood, MD;  Location: [facility] ENDOSCOPY;  Service: Endoscopy;  Laterality: N/A;   COLONOSCOPY WITH PROPOFOL N/A 01/29/2017   Procedure: COLONOSCOPY WITH PROPOFOL;  Surgeon: Wyline Mood, MD;  Location: [facility] ENDOSCOPY;  Service: Endoscopy;  Laterality: N/A;   COLONOSCOPY WITH PROPOFOL N/A 04/11/2020   Procedure: COLONOSCOPY WITH PROPOFOL;  Surgeon: Wyline Mood, MD;  Location: [facility] ENDOSCOPY;  Service: Gastroenterology;  Laterality: N/A;   CORONARY PRESSURE/FFR STUDY N/A 07/30/2021   Procedure: INTRAVASCULAR PRESSURE WIRE/FFR STUDY;  Surgeon: Iran Ouch, MD;  Location: [facility];  Service: Cardiovascular;  Laterality: N/A;   ESOPHAGEAL MANOMETRY N/A 03/04/2022   Procedure: ESOPHAGEAL MANOMETRY (EM);  Surgeon: Napoleon Form, MD;  Location: [facility]  ENDOSCOPY;  Service: Gastroenterology;  Laterality: N/A;   ESOPHAGOGASTRODUODENOSCOPY (EGD) WITH PROPOFOL N/A 01/26/2019   Procedure: ESOPHAGOGASTRODUODENOSCOPY (EGD) WITH PROPOFOL;  Surgeon: Toney Reil, MD;  Location[facility] ENDOSCOPY;  Service: Gastroenterology;  Laterality: N/A;   ESOPHAGOGASTRODUODENOSCOPY (EGD) WITH PROPOFOL N/A 09/10/2021   Procedure: ESOPHAGOGASTRODUODENOSCOPY (EGD) WITH PROPOFOL;  Surgeon: Toney Reil, MD;  Location: [facility] ENDOSCOPY;  Service: Gastroenterology;  Laterality: N/A;   EVALUATION UNDER ANESTHESIA WITH HEMORRHOIDECTOMY N/A 02/24/2017   Procedure: EXAM UNDER ANESTHESIA WITH POSSIBLE EXCISION OF INTERNAL HEMORRHOIDS;  Surgeon: Henrene Dodge, MD;  Location: [facility] ORS;  Service: General;  Laterality: N/A;   FISSURECTOMY  02/24/2017   Procedure: FISSURECTOMY;  Surgeon: Henrene Dodge, MD;  Location: [facility] ORS;  Service: General;;   HAND SURGERY Left 2020   HERNIA REPAIR Right 1991   INGUINAL HERNIA REPAIR Right 2012   Dr Sherral Hammers HERNIA REPAIR Right 2014   Dr Evette Cristal   LEFT HEART CATH AND CORONARY ANGIOGRAPHY Left 08/05/2020   Procedure: LEFT HEART CATH AND CORONARY ANGIOGRAPHY poss PCI;  Surgeon: Iran Ouch, MD;  Location: [facility];  Service: Cardiovascular;  Laterality: Left;   MUSCLE BIOPSY Left 12/31/2021   Procedure: LEFT VASTUS LATERALIS BIOPSY;  Surgeon: Venetia Night, MD;  Location: [facility] ORS;  Service: Neurosurgery;  Laterality: Left;   RIGHT/LEFT HEART CATH AND CORONARY ANGIOGRAPHY N/A 07/30/2021   Procedure: RIGHT/LEFT HEART CATH AND CORONARY ANGIOGRAPHY;  Surgeon: Iran Ouch, MD;  Location: [facility];  Service: Cardiovascular;  Laterality: N/A;   WRIST SURGERY Left    XI ROBOT ASSISTED DIAGNOSTIC LAPAROSCOPY Right 02/03/2023   Procedure: DIAGNOSTIC LAPAROSCOPY;  Surgeon: Henrene Dodge, MD;  Location: [facility] ORS;  Service: General;  Laterality: Right;    Family History  Problem Relation Age of  Onset   Hypertension Mother    Heart Problems Mother    High Cholesterol Mother    Heart Problems Father 32       myocardial infarction   Mental illness Brother    Other Maternal Aunt        COVID   Lung cancer Maternal Aunt    Lung cancer Maternal Aunt    Cancer Maternal Uncle        unknown   Prostate cancer Maternal Uncle    Testicular cancer Paternal Uncle    Thyroid disease Daughter    Breast cancer Cousin     Social History   Tobacco Use   Smoking status: Former  Types: Cigars    Start date: 12/28/2002    Quit date: 06/04/2005    Years since quitting: 18.7   Smokeless tobacco: Never   Tobacco comments:    quit 2006-smoked 1 cigar occ  Substance Use Topics   Alcohol use: Not Currently    Comment: has not had a drink in 8 months 02/2021     Current Outpatient Medications:    ACCU-CHEK GUIDE test strip, USE TO TEST BLOOD SUGAR THREE TIMES DAILY AS NEEDED, Disp: 100 strip, Rfl: 1   Accu-Chek Softclix Lancets lancets, TEST TWICE DAILY, Disp: 100 each, Rfl: 3   Alpha Lipoic Acid 200 MG CAPS, Take 200 mg by mouth daily., Disp: , Rfl:    aspirin EC 81 MG tablet, Take by mouth., Disp: , Rfl:    blood glucose meter kit and supplies, 1 each by Other route in the morning and at bedtime. Dispense based on patient and insurance preference. Use up to two  times daily. (FOR ICD-10 E10.9, E11.9)., Disp: 1 each, Rfl: 0   carvedilol (COREG) 6.25 MG tablet, TAKE 1 TABLET(6.25 MG) BY MOUTH TWICE DAILY, Disp: 180 tablet, Rfl: 1   Coenzyme Q10 (COQ-10) 100 MG CAPS, Take 100 mg by mouth daily., Disp: , Rfl:    fluticasone (FLONASE) 50 MCG/ACT nasal spray, INSTILL 1 SPRAY INTO BOTH NOSTRILS AS NEEDED, Disp: 48 g, Rfl: 0   fluticasone furoate-vilanterol (BREO ELLIPTA) 100-25 MCG/ACT AEPB, INHALE 1 PUFF INTO THE LUNGS DAILY, Disp: 60 each, Rfl: 6   hydrocortisone 2.5 % cream, Apply to hyperpigmentation on face QD on Monday, Wednesday, and Friday PRN., Disp: 28 g, Rfl: 3   insulin glargine  (LANTUS SOLOSTAR) 100 UNIT/ML Solostar Pen, Inject 5-10 Units into the skin daily., Disp: 15 mL, Rfl: PRN   Insulin Pen Needle 32G X 6 MM MISC, 1 each by Does not apply route daily at 12 noon., Disp: 100 each, Rfl: 0   ketoconazole (NIZORAL) 2 % shampoo, Wash scalp/body/face QD on Tuesday, Thursday, and Saturday, let sit 5 minutes before rinsing off, avoid eyes., Disp: 120 mL, Rfl: 11   nitroGLYCERIN (NITROSTAT) 0.4 MG SL tablet, Place 1 tablet (0.4 mg total) under the tongue every 5 (five) minutes as needed for chest pain., Disp: , Rfl:    omeprazole (PRILOSEC) 40 MG capsule, TAKE 1 CAPSULE(40 MG) BY MOUTH DAILY, Disp: 90 capsule, Rfl: 1   ranolazine (RANEXA) 1000 MG SR tablet, TAKE 1 TABLET(1000 MG) BY MOUTH TWICE DAILY, Disp: 180 tablet, Rfl: 0   Riboflavin (B-2) 100 MG TABS, Take 100 mg by mouth daily., Disp: , Rfl:    sildenafil (VIAGRA) 100 MG tablet, Take 1 tablet (100 mg total) by mouth daily as needed for erectile dysfunction., Disp: 30 tablet, Rfl: 0   Sulfacetamide Sodium 9.8 % SHAM, Shampoo into the face and trunk QD on Monday, Wednesday, and Friday. Let sit a few minutes before washing off., Disp: 120 mL, Rfl: 11   tirzepatide (MOUNJARO) 5 MG/0.5ML Pen, Inject 5 mg into the skin once a week. In place of trulicity, Disp: 6 mL, Rfl: 0   Vitamin D, Ergocalciferol, (DRISDOL) 1.25 MG (50000 UNIT) CAPS capsule, Take 1 capsule (50,000 Units total) by mouth every 7 (seven) days., Disp: 12 capsule, Rfl: 1   ezetimibe (ZETIA) 10 MG tablet, Take 1 tablet (10 mg total) by mouth daily., Disp: 90 tablet, Rfl: 1   losartan-hydrochlorothiazide (HYZAAR) 50-12.5 MG tablet, Take 1 tablet by mouth daily., Disp: 90 tablet, Rfl: 1   rosuvastatin (  CRESTOR) 10 MG tablet, Take 1 tablet (10 mg total) by mouth daily., Disp: 90 tablet, Rfl: 1   temazepam (RESTORIL) 15 MG capsule, Take 1 capsule (15 mg total) by mouth at bedtime., Disp: 90 capsule, Rfl: 0  Current Facility-Administered Medications:     cyanocobalamin (VITAMIN B12) injection 1,000 mcg, 1,000 mcg, Intramuscular, Once, Edsel Shives, Danna Hefty, MD  Allergies  Allergen Reactions   Gabapentin Other (See Comments)    Groggy-Mood Changes   Latex Rash   Penicillins Hives and Rash    Tolerated 1st generation cephalosporin (CEFAZOLIN) on 12/31/2021 with no documented ADRs.  PCN reaction causing immediate rash, facial/tongue/throat swelling, SOB or lightheadedness with hypotension: Yes PCN reaction causing severe rash involving mucus membranes or skin necrosis: No PCN reaction that required hospitalization No PCN reaction occurring within the last 10 years: No If all of the above answers are "NO", then may proceed with Cephalosporin use.    Cymbalta [Duloxetine Hcl]     Elevated BP and bad headache    I personally reviewed active problem list, medication list, allergies with the patient/caregiver today.   ROS  Ten systems reviewed and is negative except as mentioned in HPI    Objective  Vitals:   02/28/24 0848  BP: 132/80  Pulse: 67  Resp: 16  SpO2: 98%  Weight: 253 lb 6.4 oz (114.9 kg)  Height: 5\' 11"  (1.803 m)    Body mass index is 35.34 kg/m.  Physical Exam  Constitutional: Patient appears well-developed and well-nourished. Obese  No distress.  HEENT: head atraumatic, normocephalic, pupils equal and reactive to light, neck supple Cardiovascular: Normal rate, regular rhythm and normal heart sounds.  No murmur heard. No BLE edema. Pulmonary/Chest: Effort normal and breath sounds normal. No respiratory distress. Abdominal: Soft.  There is no tenderness. Muscular skeletal: mild effusion, pain with rom of left knee, he uses his cane Psychiatric: Patient has a normal mood and affect. behavior is normal. Judgment and thought content normal.   Recent Results (from the past 2160 hours)  HM DIABETES EYE EXAM     Status: None   Collection Time: 01/03/24 11:13 AM  Result Value Ref Range   HM Diabetic Eye Exam No  Retinopathy No Retinopathy    Comment: ABSTRACTED BY HIM  Novel Coronavirus, NAA ([facility])     Status: None   Collection Time: 01/20/24 12:00 AM   Specimen: Nasopharyngeal(NP) swabs in vial transport medium   Nasopharynge  Previous  Result Value Ref Range   SARS-CoV-2, NAA Not Detected Not Detected    Comment: This nucleic acid amplification test was developed and its performance characteristics determined by [employer]. Nucleic acid amplification tests include RT-PCR and TMA. This test has not been FDA cleared or approved. This test has been authorized by FDA under an Emergency Use Authorization (EUA). This test is only authorized for the duration of time the declaration that circumstances exist justifying the authorization of the emergency use of in vitro diagnostic tests for detection of SARS-CoV-2 virus and/or diagnosis of COVID-19 infection under section 564(b)(1) of the Act, 21 U.S.C. 161WRU-0(A) (1), unless the authorization is terminated or revoked sooner. When diagnostic testing is negative, the possibility of a false negative result should be considered in the context of a patient's recent exposures and the presence of clinical signs and symptoms consistent with COVID-19. An individual without symptoms of COVID-19 and who is not shedding SARS-CoV-2 virus wo uld expect to have a negative (not detected) result in this assay.   Specimen status  report     Status: None   Collection Time: 01/20/24 12:00 AM  Result Value Ref Range   specimen status report Comment     Comment: Please note Please note The date and/or time of collection was not indicated on the requisition as required by state and federal law.  The date of receipt of the specimen was used as the collection date if not supplied.   POCT Influenza A/B     Status: None   Collection Time: 01/20/24 11:14 AM  Result Value Ref Range   Influenza A, POC Negative Negative   Influenza B, POC Negative Negative   POCT glycosylated hemoglobin (Hb A1C)     Status: Abnormal   Collection Time: 02/28/24  8:54 AM  Result Value Ref Range   Hemoglobin A1C 8.2 (A) 4.0 - 5.6 %   HbA1c POC (<> result, manual entry)     HbA1c, POC (prediabetic range)     HbA1c, POC (controlled diabetic range)      Diabetic Foot Exam:  Diabetic foot exam was performed with the following findings:   No deformities, ulcerations, or other skin breakdown Normal sensation of 10g monofilament Intact posterior tibialis and dorsalis pedis pulses      PHQ2/9:    02/21/2024   11:12 AM 01/14/2024    1:20 PM 11/29/2023    8:55 AM 10/27/2023   10:27 AM 08/23/2023    9:08 AM  Depression screen PHQ 2/9  Decreased Interest 0 0 0 0 0  Down, Depressed, Hopeless 0 0 0 0 0  PHQ - 2 Score 0 0 0 0 0  Altered sleeping 0 0 0 0 0  Tired, decreased energy 0 0 0 0 0  Change in appetite 0 0 0 0 0  Feeling bad or failure about yourself  0 0 0 0 0  Trouble concentrating 0 0 0 0 0  Moving slowly or fidgety/restless 0 0 0 0 0  Suicidal thoughts 0 0 0 0 0  PHQ-9 Score 0 0 0 0 0  Difficult doing work/chores Not difficult at all Not difficult at all Not difficult at all  Not difficult at all    phq 9 is negative  Fall Risk:    01/14/2024    1:20 PM 11/29/2023    8:49 AM 10/27/2023   10:27 AM 08/23/2023    9:08 AM 05/11/2023    9:49 AM  Fall Risk   Falls in the past year? 1 1 0 1 0  Number falls in past yr: 1 1 0 1 0  Injury with Fall? 1 0 0 0 0  Risk for fall due to : Impaired balance/gait Impaired balance/gait No Fall Risks Impaired balance/gait No Fall Risks  Follow up Falls prevention discussed;Education provided;Falls evaluation completed Falls prevention discussed;Education provided;Falls evaluation completed Falls prevention discussed Falls prevention discussed;Education provided;Falls evaluation completed Falls prevention discussed     Assessment & Plan  1. Controlled type 2 diabetes mellitus with microalbuminuria, without  long-term current use of insulin (HCC) (Primary)  - POCT glycosylated hemoglobin (Hb A1C) - HM Diabetes Foot Exam - losartan-hydrochlorothiazide (HYZAAR) 50-12.5 MG tablet; Take 1 tablet by mouth daily.  Dispense: 90 tablet; Refill: 1 Continue current regiment for now, seems to be improving, taking Lantus when getting steroid injections  2. Angina, class III (HCC)  Under the care of cardiologist   3. Seizure-like activity (HCC)  Seen by neurologist   4. Morbidly obese (HCC)  Discussed all optiosns for weight loss medications including  Belviq, Qsymia, Saxenda and Contrave. Discussed risk and benefits of each of them.   5. Hypertension associated with diabetes (HCC)  On ARB , bp is at goal   6. 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HCC)  Going to [facility]  7. B12 deficiency  - cyanocobalamin (VITAMIN B12) injection 1,000 mcg  8. Essential hypertension  - losartan-hydrochlorothiazide (HYZAAR) 50-12.5 MG tablet; Take 1 tablet by mouth daily.  Dispense: 90 tablet; Refill: 1  9. Primary insomnia  - temazepam (RESTORIL) 15 MG capsule; Take 1 capsule (15 mg total) by mouth at bedtime.  Dispense: 90 capsule; Refill: 0  10. History of recent fall  Discussed fall precaution  11. Effusion, left knee  - DG Knee 1-2 Views Left; Future

## 2024-03-01 ENCOUNTER — Telehealth: Payer: Self-pay | Admitting: Family Medicine

## 2024-03-01 ENCOUNTER — Other Ambulatory Visit: Payer: Self-pay | Admitting: Family Medicine

## 2024-03-01 DIAGNOSIS — M25462 Effusion, left knee: Secondary | ICD-10-CM

## 2024-03-01 DIAGNOSIS — M25562 Pain in left knee: Secondary | ICD-10-CM

## 2024-03-01 DIAGNOSIS — R936 Abnormal findings on diagnostic imaging of limbs: Secondary | ICD-10-CM

## 2024-03-01 NOTE — Telephone Encounter (Signed)
  Left knee report depression fracture lateral tibia plateau vs. Positioning artifact recommend correlation with point tenderness and consider additional views vs cross-sectional imaging (CT) and full report on the way. Spoke to Cassandra at [facility] to advise stat report is available in chart for PCP to review, along with this encounter. Copied from CRM [id number]. Topic: Clinical - Request for Lab/Test Order >> Mar 01, 2024  9:29 AM Turkey B wrote: Reason for CRM: Elnita Maxwell from [facility] called with stat lab report

## 2024-03-02 ENCOUNTER — Encounter: Payer: Self-pay | Admitting: Family Medicine

## 2024-03-02 NOTE — Telephone Encounter (Signed)
 Copied from CRM [id number]. Topic: General - Other >> Mar 02, 2024 12:08 PM Dondra Prader E wrote: Reason for CRM: Gearldine Bienenstock calling from Imaging called to see if anyone had started the authorization needed for the patient's CT and MRI   Best contact: [phone] ext. [phone] >> Mar 02, 2024 12:14 PM CMA Maricela R wrote: PCP placed it yesterday, can you please help   I was off yesterday, but will get started on it when I get back from my break

## 2024-03-03 ENCOUNTER — Ambulatory Visit
Admission: RE | Admit: 2024-03-03 | Discharge: 2024-03-03 | Disposition: A | Discharge status: Home / Self Care | Source: Ambulatory Visit | Attending: Family Medicine | Admitting: Family Medicine

## 2024-03-03 ENCOUNTER — Encounter: Payer: Self-pay | Admitting: Family Medicine

## 2024-03-03 DIAGNOSIS — M1712 Unilateral primary osteoarthritis, left knee: Secondary | ICD-10-CM | POA: Diagnosis not present

## 2024-03-03 DIAGNOSIS — M25462 Effusion, left knee: Secondary | ICD-10-CM

## 2024-03-03 DIAGNOSIS — M25562 Pain in left knee: Secondary | ICD-10-CM

## 2024-03-03 DIAGNOSIS — R600 Localized edema: Secondary | ICD-10-CM | POA: Diagnosis not present

## 2024-03-03 DIAGNOSIS — R936 Abnormal findings on diagnostic imaging of limbs: Secondary | ICD-10-CM | POA: Diagnosis not present

## 2024-03-06 ENCOUNTER — Encounter: Payer: Self-pay | Admitting: Family Medicine

## 2024-03-07 ENCOUNTER — Encounter: Payer: Self-pay | Admitting: Family Medicine

## 2024-03-07 DIAGNOSIS — M2392 Unspecified internal derangement of left knee: Secondary | ICD-10-CM | POA: Diagnosis not present

## 2024-03-15 ENCOUNTER — Ambulatory Visit: Admitting: Gastroenterology

## 2024-03-15 ENCOUNTER — Encounter: Payer: Self-pay | Admitting: Gastroenterology

## 2024-03-15 ENCOUNTER — Other Ambulatory Visit: Payer: Self-pay

## 2024-03-15 VITALS — BP 139/89 | HR 65 | Temp 97.7°F | Ht 71.0 in | Wt 250.0 lb

## 2024-03-15 DIAGNOSIS — K5900 Constipation, unspecified: Secondary | ICD-10-CM | POA: Diagnosis not present

## 2024-03-15 DIAGNOSIS — K5904 Chronic idiopathic constipation: Secondary | ICD-10-CM

## 2024-03-15 DIAGNOSIS — R6881 Early satiety: Secondary | ICD-10-CM | POA: Diagnosis not present

## 2024-03-15 DIAGNOSIS — K219 Gastro-esophageal reflux disease without esophagitis: Secondary | ICD-10-CM | POA: Diagnosis not present

## 2024-03-15 DIAGNOSIS — Z860101 Personal history of adenomatous and serrated colon polyps: Secondary | ICD-10-CM

## 2024-03-15 NOTE — Progress Notes (Signed)
 Arlyss Repress, MD [address]  [address]  [place], [place] [address]  Main: [phone]  Fax: [phone]    Gastroenterology Consultation  Referring Provider:     Alba Cory, MD Primary Care Physician:  Alba Cory, MD Primary Gastroenterologist:  Dr. Arlyss Repress Reason for Consultation: Constipation and abdominal bloating        HPI:   [name] [name] is a 63 y.o. male referred by Dr. Alba Cory, MD  for consultation & management of chronic GERD and mucus drainage per rectum. 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  He also had mild chronic neurogenic atrophy, closely followed by [facility] neurology.  He has an appointment to see a [facility] neurology on April 12.  Patient underwent endoscopic evaluation including EGD, colonoscopy, CT abdomen and pelvis which were all unremarkable. Patient had history of mild oropharyngeal dysphagia confirmed by speech pathology.  Patient has significant weight loss secondary to dysphagia.  He has regained his weight, dysphagia has significantly improved.  Continues to take omeprazole 40 mg twice daily.  He does report occasional constipation and episodes of mucus drainage per rectum which happens about once a month associated with fecal soiling.  He has increased his protein intake significantly, tries to follow healthy diet.  He does not have any other GI concerns today.  Follow-up visit 03/15/2024 [name] [name] is here for approximately 3 months history of severe constipation associated with significant abdominal bloating, feeling full, early satiety.  He is HbA1c is worsening which he attributes it to frequent intra-articular steroid injections, which caused his blood glucose to go up to 500s, switched from Trulicity to [place] about 2 months ago.  He reports having irregular bowel movements associated with abdominal bloating, hard stools, incomplete emptying, early  satiety as well as acid reflux.  He has been taking omeprazole 40 mg once daily.  He takes MiraLAX as needed only.  His weight has been stable  NSAIDs: None   Antiplts/Anticoagulants/Anti thrombotics: None   GI Procedures:  Colonoscopy 04/11/2020 Good prep, normal colon  EGD 01/26/2019 - Normal duodenal bulb and second portion of the duodenum. - Normal stomach. Biopsied. - Esophagogastric landmarks identified. - Normal gastroesophageal junction and esophagus.   DIAGNOSIS:  A.  STOMACH; COLD BIOPSY:  - ANTRAL MUCOSA WITH MILD NON-SPECIFIC CHRONIC AND FOCAL ACTIVE  GASTRITIS.  - UNREMARKABLE OXYNTIC MUCOSA.  - NEGATIVE FOR HELICOBACTER; IHC STAIN EXAMINED.  - NEGATIVE FOR INTESTINAL METAPLASIA, DYSPLASIA, AND MALIGNANCY.    Colonoscopy in 2017 and 2018 by Dr. Tobi Bastos - Preparation of the colon was poor. - Stool in the sigmoid colon. - No specimens collected.   - Three 5 to 7 mm polyps in the transverse colon, removed with a cold snare. Resected and retrieved. - One 3 mm polyp in the ascending colon, removed with a cold biopsy forceps. Resected and retrieved. - Non-bleeding internal hemorrhoids. - The examination was otherwise normal on direct and retroflexion views.   DIAGNOSIS:  A. COLON POLYP, ASCENDING; COLD BIOPSY:  - TUBULAR ADENOMA.  - NEGATIVE FOR HIGH-GRADE DYSPLASIA AND MALIGNANCY.   B.  COLON POLYP 2, TRANSVERSE; COLD SNARE:  - TUBULAR ADENOMAS (2), NEGATIVE FOR HIGH-GRADE DYSPLASIA AND  MALIGNANCY.  - POLYPOID COLONIC MUCOSA WITH HEMORRHAGE OF THE LAMINA PROPRIA (1).    Past Medical History:  Diagnosis Date   Allergy    Angina, class III (HCC)    CAD (coronary artery disease)    a.  05/30/2015 cath: LM nl, mLAD 50% (FFR 0.83), LCx minor irregs, RCA minor irregs, EF 55-65%-->Med Rx; b. 03/2016 Cath: LM nl LAD 26m, D1/2/3 min irregs, LCX min irregs, OM1/2/3 nl, RCA min irregs, RPDA/RPAV/RPL1/RPL2 nl-->Med Rx; b. 07/2020 Cath: LM nl, LAD 15m, D1/2/3 min irregs, LCX min  irregs, RCA min irregs, RPDA/RPAV/RPL1,2 nl, EF 55-65%.   Chronic constipation    Degeneration of intervertebral disc of cervical region    Diastolic dysfunction    a.) TTE 07/22/2017: EF 60-65%, G1DD; b.) TTE 02/14/2021: EF 60-65%, G1DD; c.) TTE 01/19/2023: EF 55-60%, AoV sclerosis without stenosis, G1DD   Dyspnea    Erectile dysfunction    a.) on PDE5i (sildenafil)   GERD (gastroesophageal reflux disease)    Hernia 1991   Hyperlipidemia    Hypertension 2008   Mitochondrial myopathy    a.) followed by medical genetics specialist (Dr. Kaleen Mask, MD) at [facility]   Nerve root pain    Neuropathy    Obesity, unspecified 2012   OSA on CPAP    Personal history of tobacco use, presenting hazards to health 2012   Polycythemia    Rectal bleeding 02/11/2017   Recurrent Right Inguinal Hernia Repair 02/09/2011, 04/07/2013   Seizure (HCC)    pt states neurologist thinking he was having seizures while sleeping-eeg done 10-2022 and was normal   Sleep difficulties    a.) on BZO (temazepam) PRN   T2DM (type 2 diabetes mellitus) (HCC)    Umbilical hernia without mention of obstruction or gangrene 02/09/2011   Vitamin D deficiency     Past Surgical History:  Procedure Laterality Date   BACK SURGERY  12/2008   X2-LUMBAR   CARDIAC CATHETERIZATION N/A 05/30/2015   Procedure: Left Heart Cath;  Surgeon: Iran Ouch, MD;  Location: [facility] INVASIVE CV LAB;  Service: Cardiovascular;  Laterality: N/A;   CARDIAC CATHETERIZATION N/A 04/16/2016   Procedure: Left Heart Cath and Coronary Angiography;  Surgeon: Iran Ouch, MD;  Location: [facility] INVASIVE CV LAB;  Service: Cardiovascular;  Laterality: N/A;   COLONOSCOPY  2014   Dr. Evette Cristal   COLONOSCOPY WITH PROPOFOL N/A 12/25/2016   Procedure: COLONOSCOPY WITH PROPOFOL;  Surgeon: Wyline Mood, MD;  Location: [facility] ENDOSCOPY;  Service: Endoscopy;  Laterality: N/A;   COLONOSCOPY WITH PROPOFOL N/A 01/29/2017   Procedure: COLONOSCOPY WITH PROPOFOL;  Surgeon:  Wyline Mood, MD;  Location: [facility] ENDOSCOPY;  Service: Endoscopy;  Laterality: N/A;   COLONOSCOPY WITH PROPOFOL N/A 04/11/2020   Procedure: COLONOSCOPY WITH PROPOFOL;  Surgeon: Wyline Mood, MD;  Location: [facility] ENDOSCOPY;  Service: Gastroenterology;  Laterality: N/A;   CORONARY PRESSURE/FFR STUDY N/A 07/30/2021   Procedure: INTRAVASCULAR PRESSURE WIRE/FFR STUDY;  Surgeon: Iran Ouch, MD;  Location: [facility] INVASIVE CV LAB;  Service: Cardiovascular;  Laterality: N/A;   ESOPHAGEAL MANOMETRY N/A 03/04/2022   Procedure: ESOPHAGEAL MANOMETRY (EM);  Surgeon: Napoleon Form, MD;  Location: [facility] ENDOSCOPY;  Service: Gastroenterology;  Laterality: N/A;   ESOPHAGOGASTRODUODENOSCOPY (EGD) WITH PROPOFOL N/A 01/26/2019   Procedure: ESOPHAGOGASTRODUODENOSCOPY (EGD) WITH PROPOFOL;  Surgeon: Toney Reil, MD;  Location: [facility] ENDOSCOPY;  Service: Gastroenterology;  Laterality: N/A;   ESOPHAGOGASTRODUODENOSCOPY (EGD) WITH PROPOFOL N/A 09/10/2021   Procedure: ESOPHAGOGASTRODUODENOSCOPY (EGD) WITH PROPOFOL;  Surgeon: Toney Reil, MD;  Location: [facility] ENDOSCOPY;  Service: Gastroenterology;  Laterality: N/A;   EVALUATION UNDER ANESTHESIA WITH HEMORRHOIDECTOMY N/A 02/24/2017   Procedure: EXAM UNDER ANESTHESIA WITH POSSIBLE EXCISION OF INTERNAL HEMORRHOIDS;  Surgeon: Henrene Dodge, MD;  Location: [facility] ORS;  Service: General;  Laterality:  N/A;   FISSURECTOMY  02/24/2017   Procedure: FISSURECTOMY;  Surgeon: Henrene Dodge, MD;  Location: [facility] ORS;  Service: General;;   HAND SURGERY Left 2020   HERNIA REPAIR Right 1991   INGUINAL HERNIA REPAIR Right 2012   Dr Sherral Hammers HERNIA REPAIR Right 2014   Dr Evette Cristal   LEFT HEART CATH AND CORONARY ANGIOGRAPHY Left 08/05/2020   Procedure: LEFT HEART CATH AND CORONARY ANGIOGRAPHY poss PCI;  Surgeon: Iran Ouch, MD;  Location: [facility] INVASIVE CV [facility];  Service: Cardiovascular;  Laterality: Left;   MUSCLE BIOPSY Left 12/31/2021   Procedure: LEFT VASTUS  LATERALIS BIOPSY;  Surgeon: Venetia Night, MD;  Location: [facility] ORS;  Service: Neurosurgery;  Laterality: Left;   RIGHT/LEFT HEART CATH AND CORONARY ANGIOGRAPHY N/A 07/30/2021   Procedure: RIGHT/LEFT HEART CATH AND CORONARY ANGIOGRAPHY;  Surgeon: Iran Ouch, MD;  Location: [facility] CV LAB;  Service: Cardiovascular;  Laterality: N/A;   WRIST SURGERY Left    XI ROBOT ASSISTED DIAGNOSTIC LAPAROSCOPY Right 02/03/2023   Procedure: DIAGNOSTIC LAPAROSCOPY;  Surgeon: Henrene Dodge, MD;  Location: [facility] ORS;  Service: General;  Laterality: Right;     Current Outpatient Medications:    ACCU-CHEK GUIDE test strip, USE TO TEST BLOOD SUGAR THREE TIMES DAILY AS NEEDED, Disp: 100 strip, Rfl: 1   Accu-Chek Softclix Lancets lancets, TEST TWICE DAILY, Disp: 100 each, Rfl: 3   Alpha Lipoic Acid 200 MG CAPS, Take 200 mg by mouth daily., Disp: , Rfl:    aspirin EC 81 MG tablet, Take by mouth., Disp: , Rfl:    blood glucose meter kit and supplies, 1 each by Other route in the morning and at bedtime. Dispense based on patient and insurance preference. Use up to two  times daily. (FOR ICD-10 E10.9, E11.9)., Disp: 1 each, Rfl: 0   carvedilol (COREG) 6.25 MG tablet, TAKE 1 TABLET(6.25 MG) BY MOUTH TWICE DAILY, Disp: 180 tablet, Rfl: 1   Coenzyme Q10 (COQ-10) 100 MG CAPS, Take 100 mg by mouth daily., Disp: , Rfl:    DULoxetine (CYMBALTA) 30 MG capsule, Take 30 mg by mouth daily., Disp: , Rfl:    ezetimibe (ZETIA) 10 MG tablet, Take 1 tablet (10 mg total) by mouth daily., Disp: 90 tablet, Rfl: 1   fluticasone (FLONASE) 50 MCG/ACT nasal spray, INSTILL 1 SPRAY INTO BOTH NOSTRILS AS NEEDED, Disp: 48 g, Rfl: 0   fluticasone furoate-vilanterol (BREO ELLIPTA) 100-25 MCG/ACT AEPB, INHALE 1 PUFF INTO THE LUNGS DAILY, Disp: 60 each, Rfl: 6   hydrocortisone 2.5 % cream, Apply to hyperpigmentation on face QD on Monday, Wednesday, and Friday PRN., Disp: 28 g, Rfl: 3   insulin glargine (LANTUS SOLOSTAR) 100 UNIT/ML Solostar  Pen, Inject 5-10 Units into the skin daily., Disp: 15 mL, Rfl: PRN   Insulin Pen Needle 32G X 6 MM MISC, 1 each by Does not apply route daily at 12 noon., Disp: 100 each, Rfl: 0   ketoconazole (NIZORAL) 2 % shampoo, Wash scalp/body/face QD on Tuesday, Thursday, and Saturday, let sit 5 minutes before rinsing off, avoid eyes., Disp: 120 mL, Rfl: 11   losartan-hydrochlorothiazide (HYZAAR) 50-12.5 MG tablet, Take 1 tablet by mouth daily., Disp: 90 tablet, Rfl: 1   nitroGLYCERIN (NITROSTAT) 0.4 MG SL tablet, Place 1 tablet (0.4 mg total) under the tongue every 5 (five) minutes as needed for chest pain., Disp: , Rfl:    omeprazole (PRILOSEC) 40 MG capsule, TAKE 1 CAPSULE(40 MG) BY MOUTH DAILY, Disp: 90 capsule, Rfl: 1  ranolazine (RANEXA) 1000 MG SR tablet, TAKE 1 TABLET(1000 MG) BY MOUTH TWICE DAILY, Disp: 180 tablet, Rfl: 0   Riboflavin (B-2) 100 MG TABS, Take 100 mg by mouth daily., Disp: , Rfl:    rosuvastatin (CRESTOR) 10 MG tablet, Take 1 tablet (10 mg total) by mouth daily., Disp: 90 tablet, Rfl: 1   sildenafil (VIAGRA) 100 MG tablet, Take 1 tablet (100 mg total) by mouth daily as needed for erectile dysfunction., Disp: 30 tablet, Rfl: 0   Sulfacetamide Sodium 9.8 % SHAM, Shampoo into the face and trunk QD on Monday, Wednesday, and Friday. Let sit a few minutes before washing off., Disp: 120 mL, Rfl: 11   temazepam (RESTORIL) 15 MG capsule, Take 1 capsule (15 mg total) by mouth at bedtime., Disp: 90 capsule, Rfl: 0   tirzepatide (MOUNJARO) 5 MG/0.5ML Pen, Inject 5 mg into the skin once a week. In place of trulicity, Disp: 6 mL, Rfl: 0   Vitamin D, Ergocalciferol, (DRISDOL) 1.25 MG (50000 UNIT) CAPS capsule, Take 1 capsule (50,000 Units total) by mouth every 7 (seven) days., Disp: 12 capsule, Rfl: 1   Family History  Problem Relation Age of Onset   Hypertension Mother    Heart Problems Mother    High Cholesterol Mother    Heart Problems Father 33       myocardial infarction   Mental illness  Brother    Other Maternal Aunt        COVID   Lung cancer Maternal Aunt    Lung cancer Maternal Aunt    Cancer Maternal Uncle        unknown   Prostate cancer Maternal Uncle    Testicular cancer Paternal Uncle    Thyroid disease Daughter    Breast cancer Cousin      Social History   Tobacco Use   Smoking status: Former    Types: Cigars    Start date: 12/28/2002    Quit date: 06/04/2005    Years since quitting: 18.7   Smokeless tobacco: Never   Tobacco comments:    quit 2006-smoked 1 cigar occ  Vaping Use   Vaping status: Never Used  Substance Use Topics   Alcohol use: Not Currently    Comment: has not had a drink in 8 months 02/2021   Drug use: No    Allergies as of 03/15/2024 - Review Complete 03/15/2024  Allergen Reaction Noted   Gabapentin Other (See Comments) 04/16/2015   Latex Rash 06/20/2015   Penicillins Hives and Rash 03/23/2013   Cymbalta [duloxetine hcl]  01/04/2024    Review of Systems:    All systems reviewed and negative except where noted in HPI.   Physical Exam:  BP 139/89 (BP Location: Left Arm, Patient Position: Sitting, Cuff Size: Normal)   Pulse 65   Temp 97.7 F (36.5 C) (Oral)   Ht 5\' 11"  (1.803 m)   Wt 250 lb (113.4 kg)   BMI 34.87 kg/m  No LMP for male patient.  General:   Alert,  Well-developed, well-nourished, pleasant and cooperative in NAD Head:  Normocephalic and atraumatic. Eyes:  Sclera clear, no icterus.   Conjunctiva pink. Ears:  Normal auditory acuity. Nose:  No deformity, discharge, or lesions. Mouth:  No deformity or lesions,oropharynx pink & moist. Neck:  Supple; no masses or thyromegaly. Lungs:  Respirations even and unlabored.  Clear throughout to auscultation.   No wheezes, crackles, or rhonchi. No acute distress. Heart:  Regular rate and rhythm; no murmurs, clicks, rubs, or  gallops. Abdomen:  Normal bowel sounds. Soft, non-tender and moderately distended, tympanic without masses, hepatosplenomegaly or hernias noted.  No  guarding or rebound tenderness.   Rectal: Not performed Msk:  Symmetrical without gross deformities. Good, equal movement & strength bilaterally. Pulses:  Normal pulses noted. Extremities:  No clubbing or edema.  No cyanosis. Neurologic:  Alert and oriented x3;  grossly normal neurologically. Skin:  Intact without significant lesions or rashes. No jaundice. Psych:  Alert and cooperative. Normal mood and affect.  Imaging Studies: Reviewed  Assessment and Plan:   [name] [name] is a 63 y.o. male with history of metabolic syndrome, history of mitochondrial myopathy, chronic neurogenic atrophy, mild oropharyngeal dysphagia and history of weight loss.  Dysphagia and weight loss have resolved.  Patient was on Trulicity in the past which has been discontinued.  Patient underwent extensive GI work-up including upper endoscopy with esophageal biopsies, esophageal manometry which were unremarkable.  CT abdomen and pelvis was normal.  Gastric emptying study was normal.  Speech pathology evaluation revealed mild oropharyngeal dysphagia.  Patient also has generalized muscle weakness, underwent muscle biopsy, pathology concerning for mitochondrial myopathy.  He is currently closely followed by [facility] neurology for chronic neurogenic atrophy and mitochondrial myopathy.  Patient has regained weight, and continues to gain weight with increasing BMI Presents for follow-up with new onset of constipation, abdominal bloating, early satiety, worsening diabetes, switched to from Trulicity to [facility] in January 2025  Current constipation with abdominal bloating, early satiety Associated with weight gain, worsening diabetes, GLP-1 can also lead to GI dysmotility Advised patient to start taking MiraLAX 1 capful with large cup of water daily Advised portion control, weight loss, tight control of diabetes Incorporate more fiber in diet, adequate intake of water  Chronic GERD EGD has been normal in the past Continue  omeprazole 40 mg once daily Okay to take sucralfate as needed  History of adenomas of the colon Recommend surveillance colonoscopy in 04/2025  Follow up as needed   Arlyss Repress, MD

## 2024-03-21 ENCOUNTER — Other Ambulatory Visit: Payer: Self-pay | Admitting: Orthopedic Surgery

## 2024-03-21 ENCOUNTER — Encounter: Payer: Self-pay | Admitting: Family Medicine

## 2024-03-21 DIAGNOSIS — M2392 Unspecified internal derangement of left knee: Secondary | ICD-10-CM

## 2024-03-24 ENCOUNTER — Ambulatory Visit
Admission: RE | Admit: 2024-03-24 | Discharge: 2024-03-24 | Disposition: A | Discharge status: Home / Self Care | Source: Ambulatory Visit | Attending: Orthopedic Surgery | Admitting: Orthopedic Surgery

## 2024-03-24 DIAGNOSIS — M7122 Synovial cyst of popliteal space [Baker], left knee: Secondary | ICD-10-CM | POA: Diagnosis not present

## 2024-03-24 DIAGNOSIS — M1712 Unilateral primary osteoarthritis, left knee: Secondary | ICD-10-CM | POA: Diagnosis not present

## 2024-03-24 DIAGNOSIS — M25462 Effusion, left knee: Secondary | ICD-10-CM | POA: Diagnosis not present

## 2024-03-24 DIAGNOSIS — M2392 Unspecified internal derangement of left knee: Secondary | ICD-10-CM | POA: Diagnosis not present

## 2024-03-24 DIAGNOSIS — S83242D Other tear of medial meniscus, current injury, left knee, subsequent encounter: Secondary | ICD-10-CM | POA: Diagnosis not present

## 2024-03-25 DIAGNOSIS — G713 Mitochondrial myopathy, not elsewhere classified: Secondary | ICD-10-CM | POA: Diagnosis not present

## 2024-03-28 ENCOUNTER — Other Ambulatory Visit: Payer: Self-pay | Admitting: Family Medicine

## 2024-03-28 DIAGNOSIS — N529 Male erectile dysfunction, unspecified: Secondary | ICD-10-CM

## 2024-03-30 DIAGNOSIS — S8392XA Sprain of unspecified site of left knee, initial encounter: Secondary | ICD-10-CM | POA: Diagnosis not present

## 2024-03-31 ENCOUNTER — Encounter: Payer: Self-pay | Admitting: Family Medicine

## 2024-04-05 DIAGNOSIS — M7522 Bicipital tendinitis, left shoulder: Secondary | ICD-10-CM | POA: Diagnosis not present

## 2024-04-05 DIAGNOSIS — G5622 Lesion of ulnar nerve, left upper limb: Secondary | ICD-10-CM | POA: Diagnosis not present

## 2024-04-05 DIAGNOSIS — M5412 Radiculopathy, cervical region: Secondary | ICD-10-CM | POA: Diagnosis not present

## 2024-04-05 DIAGNOSIS — M4802 Spinal stenosis, cervical region: Secondary | ICD-10-CM | POA: Diagnosis not present

## 2024-04-05 DIAGNOSIS — M25512 Pain in left shoulder: Secondary | ICD-10-CM | POA: Diagnosis not present

## 2024-04-10 ENCOUNTER — Encounter: Payer: Self-pay | Admitting: Family Medicine

## 2024-04-11 DIAGNOSIS — M47812 Spondylosis without myelopathy or radiculopathy, cervical region: Secondary | ICD-10-CM | POA: Diagnosis not present

## 2024-04-11 DIAGNOSIS — Z794 Long term (current) use of insulin: Secondary | ICD-10-CM | POA: Diagnosis not present

## 2024-04-11 DIAGNOSIS — M5412 Radiculopathy, cervical region: Secondary | ICD-10-CM | POA: Diagnosis not present

## 2024-04-11 DIAGNOSIS — Z981 Arthrodesis status: Secondary | ICD-10-CM | POA: Diagnosis not present

## 2024-04-11 DIAGNOSIS — M1712 Unilateral primary osteoarthritis, left knee: Secondary | ICD-10-CM | POA: Diagnosis not present

## 2024-04-11 DIAGNOSIS — M50122 Cervical disc disorder at C5-C6 level with radiculopathy: Secondary | ICD-10-CM | POA: Diagnosis not present

## 2024-04-11 DIAGNOSIS — M4722 Other spondylosis with radiculopathy, cervical region: Secondary | ICD-10-CM | POA: Diagnosis not present

## 2024-04-11 DIAGNOSIS — S83207A Unspecified tear of unspecified meniscus, current injury, left knee, initial encounter: Secondary | ICD-10-CM | POA: Diagnosis not present

## 2024-04-11 DIAGNOSIS — E119 Type 2 diabetes mellitus without complications: Secondary | ICD-10-CM | POA: Diagnosis not present

## 2024-04-11 DIAGNOSIS — X58XXXA Exposure to other specified factors, initial encounter: Secondary | ICD-10-CM | POA: Diagnosis not present

## 2024-04-11 DIAGNOSIS — M5030 Other cervical disc degeneration, unspecified cervical region: Secondary | ICD-10-CM | POA: Diagnosis not present

## 2024-04-12 ENCOUNTER — Ambulatory Visit: Payer: Medicaid Other | Admitting: Gastroenterology

## 2024-04-12 ENCOUNTER — Other Ambulatory Visit: Payer: Self-pay | Admitting: Internal Medicine

## 2024-04-12 DIAGNOSIS — E1165 Type 2 diabetes mellitus with hyperglycemia: Secondary | ICD-10-CM

## 2024-04-13 ENCOUNTER — Ambulatory Visit

## 2024-04-13 ENCOUNTER — Encounter: Payer: Self-pay | Admitting: Family Medicine

## 2024-04-13 DIAGNOSIS — S8392XA Sprain of unspecified site of left knee, initial encounter: Secondary | ICD-10-CM | POA: Diagnosis not present

## 2024-04-13 DIAGNOSIS — E538 Deficiency of other specified B group vitamins: Secondary | ICD-10-CM | POA: Diagnosis not present

## 2024-04-13 MED ORDER — CYANOCOBALAMIN 1000 MCG/ML IJ SOLN
1000.0000 ug | Freq: Once | INTRAMUSCULAR | Status: AC
  Administered 2024-04-13: 1000 ug via INTRAMUSCULAR

## 2024-04-13 NOTE — Progress Notes (Signed)
 Patient is in office today for a nurse visit for B12 Injection. Patient Injection was given in the  Left deltoid. Patient tolerated injection well.

## 2024-04-14 ENCOUNTER — Encounter: Payer: Self-pay | Admitting: Gastroenterology

## 2024-04-16 ENCOUNTER — Other Ambulatory Visit: Payer: Self-pay | Admitting: Family Medicine

## 2024-04-16 MED ORDER — TIRZEPATIDE 7.5 MG/0.5ML ~~LOC~~ SOAJ: 7.5000 mg | SUBCUTANEOUS | 0 refills | Status: DC

## 2024-04-17 ENCOUNTER — Other Ambulatory Visit: Payer: Self-pay

## 2024-04-17 DIAGNOSIS — R1319 Other dysphagia: Secondary | ICD-10-CM

## 2024-04-17 MED ORDER — OMEPRAZOLE 40 MG PO CPDR: 40.0000 mg | DELAYED_RELEASE_CAPSULE | Freq: Two times a day (BID) | ORAL | 0 refills | Status: DC

## 2024-04-17 NOTE — Addendum Note (Signed)
 Addended by: Conny Del L on: 04/17/2024 01:58 PM   Modules accepted: Orders

## 2024-04-21 ENCOUNTER — Encounter: Payer: Self-pay | Admitting: Cardiovascular Disease

## 2024-04-21 MED ORDER — RANOLAZINE ER 1000 MG PO TB12: 1000.0000 mg | ORAL_TABLET | Freq: Two times a day (BID) | ORAL | 0 refills | Status: DC

## 2024-04-25 DIAGNOSIS — G713 Mitochondrial myopathy, not elsewhere classified: Secondary | ICD-10-CM | POA: Diagnosis not present

## 2024-05-01 ENCOUNTER — Other Ambulatory Visit: Payer: Self-pay

## 2024-05-02 ENCOUNTER — Encounter: Payer: Self-pay | Admitting: Gastroenterology

## 2024-05-02 ENCOUNTER — Ambulatory Visit: Admitting: Gastroenterology

## 2024-05-02 VITALS — BP 132/83 | HR 64 | Temp 97.7°F | Ht 71.0 in | Wt 247.2 lb

## 2024-05-02 DIAGNOSIS — R140 Abdominal distension (gaseous): Secondary | ICD-10-CM | POA: Diagnosis not present

## 2024-05-02 DIAGNOSIS — R6881 Early satiety: Secondary | ICD-10-CM | POA: Diagnosis not present

## 2024-05-02 DIAGNOSIS — R635 Abnormal weight gain: Secondary | ICD-10-CM | POA: Diagnosis not present

## 2024-05-02 DIAGNOSIS — R1319 Other dysphagia: Secondary | ICD-10-CM

## 2024-05-02 DIAGNOSIS — Z860101 Personal history of adenomatous and serrated colon polyps: Secondary | ICD-10-CM | POA: Diagnosis not present

## 2024-05-02 DIAGNOSIS — R1312 Dysphagia, oropharyngeal phase: Secondary | ICD-10-CM

## 2024-05-02 DIAGNOSIS — K219 Gastro-esophageal reflux disease without esophagitis: Secondary | ICD-10-CM | POA: Diagnosis not present

## 2024-05-02 DIAGNOSIS — K5909 Other constipation: Secondary | ICD-10-CM | POA: Diagnosis not present

## 2024-05-02 DIAGNOSIS — K5901 Slow transit constipation: Secondary | ICD-10-CM

## 2024-05-02 MED ORDER — OMEPRAZOLE 40 MG PO CPDR: 40.0000 mg | DELAYED_RELEASE_CAPSULE | Freq: Two times a day (BID) | ORAL | 0 refills | Status: DC

## 2024-05-02 MED ORDER — LINACLOTIDE 145 MCG PO CAPS: 145.0000 ug | ORAL_CAPSULE | Freq: Every day | ORAL | 0 refills | Status: AC

## 2024-05-02 NOTE — Patient Instructions (Signed)
 Gave samples of Linzess 72mcg take 2 capsule every morning.

## 2024-05-02 NOTE — Progress Notes (Unsigned)
 Karma Oz, MD [address]  [address]  [place], [place] [address]  Main: [phone]  Fax: [phone]    Gastroenterology Consultation  Referring Provider:     Sowles, Krichna, MD Primary Care Physician:  Sowles, Krichna, MD Primary Gastroenterologist:  Dr. Karma Oz Reason for Consultation: Constipation and abdominal bloating, dysphagia        HPI:   [name] [name] is a 63 y.o. male referred by Dr. Arleen Lacer, MD  for consultation & management of chronic GERD and mucus drainage per rectum. 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  He also had mild chronic neurogenic atrophy, closely followed by [facility] neurology.  He has an appointment to see a [facility] neurology on April 12.  Patient underwent endoscopic evaluation including EGD, colonoscopy, CT abdomen and pelvis which were all unremarkable. Patient had history of mild oropharyngeal dysphagia confirmed by speech pathology.  Patient has significant weight loss secondary to dysphagia.  He has regained his weight, dysphagia has significantly improved.  Continues to take omeprazole  40 mg twice daily.  He does report occasional constipation and episodes of mucus drainage per rectum which happens about once a month associated with fecal soiling.  He has increased his protein intake significantly, tries to follow healthy diet.  He does not have any other GI concerns today.  Follow-up visit 03/15/2024 [name] [name] is here for approximately 3 months history of severe constipation associated with significant abdominal bloating, feeling full, early satiety.  He is HbA1c is worsening which he attributes it to frequent intra-articular steroid injections, which caused his blood glucose to go up to 500s, switched from Trulicity  to Mounjaro about 2 months ago.  He reports having irregular bowel movements associated with abdominal bloating, hard stools, incomplete  emptying, early satiety as well as acid reflux.  He has been taking omeprazole  40 mg once daily.  He takes MiraLAX  as needed only.  His weight has been stable  Follow-up visit 05/02/2024 [name] [name] is here for follow-up of worsening symptoms of difficulty swallowing as well as hoarseness of voice.  He is closely followed by [facility] neurology for management of unspecified mitochondrial myopathy.  He reached out to my office via MyChart on 04/14/2024 for follow-up visit because of worsening of constipation along with hoarseness of voice as well as difficulty swallowing.  He does have history of oropharyngeal dysphagia and they suggested him to follow-up with speech pathology again.  I also increased his omeprazole  to 40 mg twice daily before meals.  He reported improvement in his symptoms on this dose, I gave him prescription for 1 month only.  He is scheduled to undergo upper endoscopy next week.  He reports ongoing constipation along with abdominal bloating as well.  Limited physical activity.  He is not strictly following healthy eating habits.  He is eating only soft foods and mostly mashed potatoes.  He also states that his voice becomes hoarse when he talks longer and he gets tired as well as feels short of breath  NSAIDs: None   Antiplts/Anticoagulants/Anti thrombotics: None   GI Procedures:  Colonoscopy 04/11/2020 Good prep, normal colon  EGD 01/26/2019 - Normal duodenal bulb and second portion of the duodenum. - Normal stomach. Biopsied. - Esophagogastric landmarks identified. - Normal gastroesophageal junction and esophagus.   DIAGNOSIS:  A.  STOMACH; COLD BIOPSY:  - ANTRAL MUCOSA WITH MILD NON-SPECIFIC CHRONIC AND FOCAL ACTIVE  GASTRITIS.  - UNREMARKABLE OXYNTIC MUCOSA.  -  NEGATIVE FOR HELICOBACTER; IHC STAIN EXAMINED.  - NEGATIVE FOR INTESTINAL METAPLASIA, DYSPLASIA, AND MALIGNANCY.    Colonoscopy in 2017 and 2018 by Dr. Antony Baumgartner - Preparation of the colon was poor. - Stool in the sigmoid  colon. - No specimens collected.   - Three 5 to 7 mm polyps in the transverse colon, removed with a cold snare. Resected and retrieved. - One 3 mm polyp in the ascending colon, removed with a cold biopsy forceps. Resected and retrieved. - Non-bleeding internal hemorrhoids. - The examination was otherwise normal on direct and retroflexion views.   DIAGNOSIS:  A. COLON POLYP, ASCENDING; COLD BIOPSY:  - TUBULAR ADENOMA.  - NEGATIVE FOR HIGH-GRADE DYSPLASIA AND MALIGNANCY.   B.  COLON POLYP 2, TRANSVERSE; COLD SNARE:  - TUBULAR ADENOMAS (2), NEGATIVE FOR HIGH-GRADE DYSPLASIA AND  MALIGNANCY.  - POLYPOID COLONIC MUCOSA WITH HEMORRHAGE OF THE LAMINA PROPRIA (1).    Past Medical History:  Diagnosis Date   Allergy    Angina, class III (HCC)    CAD (coronary artery disease)    a. 05/30/2015 cath: LM nl, mLAD 50% (FFR 0.83), LCx minor irregs, RCA minor irregs, EF 55-65%-->Med Rx; b. 03/2016 Cath: LM nl LAD 34m, D1/2/3 min irregs, LCX min irregs, OM1/2/3 nl, RCA min irregs, RPDA/RPAV/RPL1/RPL2 nl-->Med Rx; b. 07/2020 Cath: LM nl, LAD 57m, D1/2/3 min irregs, LCX min irregs, RCA min irregs, RPDA/RPAV/RPL1,2 nl, EF 55-65%.   Chronic constipation    Degeneration of intervertebral disc of cervical region    Diastolic dysfunction    a.) TTE 07/22/2017: EF 60-65%, G1DD; b.) TTE 02/14/2021: EF 60-65%, G1DD; c.) TTE 01/19/2023: EF 55-60%, AoV sclerosis without stenosis, G1DD   Dyspnea    Erectile dysfunction    a.) on PDE5i (sildenafil )   GERD (gastroesophageal reflux disease)    Hernia 1991   Hyperlipidemia    Hypertension 2008   Mitochondrial myopathy    a.) followed by medical genetics specialist (Dr. Evorn Holder, MD) at [facility]   Nerve root pain    Neuropathy    Obesity, unspecified 2012   OSA on CPAP    Personal history of tobacco use, presenting hazards to health 2012   Polycythemia    Rectal bleeding 02/11/2017   Recurrent Right Inguinal Hernia Repair 02/09/2011, 04/07/2013   Seizure (HCC)     pt states neurologist thinking he was having seizures while sleeping-eeg done 10-2022 and was normal   Sleep difficulties    a.) on BZO (temazepam ) PRN   T2DM (type 2 diabetes mellitus) (HCC)    Umbilical hernia without mention of obstruction or gangrene 02/09/2011   Vitamin D  deficiency     Past Surgical History:  Procedure Laterality Date   BACK SURGERY  12/2008   [facility]   CARDIAC CATHETERIZATION N/A 05/30/2015   Procedure: Left Heart Cath;  Surgeon: Wenona Hamilton, MD;  Location: [facility] INVASIVE CV LAB;  Service: Cardiovascular;  Laterality: N/A;   CARDIAC CATHETERIZATION N/A 04/16/2016   Procedure: Left Heart Cath and Coronary Angiography;  Surgeon: Wenona Hamilton, MD;  Location: [facility] INVASIVE CV LAB;  Service: Cardiovascular;  Laterality: N/A;   COLONOSCOPY  2014   Dr. Lorel Roes   COLONOSCOPY WITH PROPOFOL  N/A 12/25/2016   Procedure: COLONOSCOPY WITH PROPOFOL ;  Surgeon: Luke Salaam, MD;  Location: [facility] ENDOSCOPY;  Service: Endoscopy;  Laterality: N/A;   COLONOSCOPY WITH PROPOFOL  N/A 01/29/2017   Procedure: COLONOSCOPY WITH PROPOFOL ;  Surgeon: Luke Salaam, MD;  Location: [facility] ENDOSCOPY;  Service: Endoscopy;  Laterality: N/A;  COLONOSCOPY WITH PROPOFOL  N/A 04/11/2020   Procedure: COLONOSCOPY WITH PROPOFOL ;  Surgeon: Luke Salaam, MD;  Location: [facility] ENDOSCOPY;  Service: Gastroenterology;  Laterality: N/A;   CORONARY PRESSURE/FFR STUDY N/A 07/30/2021   Procedure: INTRAVASCULAR PRESSURE WIRE/FFR STUDY;  Surgeon: Wenona Hamilton, MD;  Location: [facility] INVASIVE CV LAB;  Service: Cardiovascular;  Laterality: N/A;   ESOPHAGEAL MANOMETRY N/A 03/04/2022   Procedure: ESOPHAGEAL MANOMETRY (EM);  Surgeon: Sergio Dandy, MD;  Location: [facility] ENDOSCOPY;  Service: Gastroenterology;  Laterality: N/A;   ESOPHAGOGASTRODUODENOSCOPY (EGD) WITH PROPOFOL  N/A 01/26/2019   Procedure: ESOPHAGOGASTRODUODENOSCOPY (EGD) WITH PROPOFOL ;  Surgeon: Selena Daily, MD;  Location: [facility] ENDOSCOPY;  Service:  Gastroenterology;  Laterality: N/A;   ESOPHAGOGASTRODUODENOSCOPY (EGD) WITH PROPOFOL  N/A 09/10/2021   Procedure: ESOPHAGOGASTRODUODENOSCOPY (EGD) WITH PROPOFOL ;  Surgeon: Selena Daily, MD;  Location: [facility] ENDOSCOPY;  Service: Gastroenterology;  Laterality: N/A;   EVALUATION UNDER ANESTHESIA WITH HEMORRHOIDECTOMY N/A 02/24/2017   Procedure: EXAM UNDER ANESTHESIA WITH POSSIBLE EXCISION OF INTERNAL HEMORRHOIDS;  Surgeon: Emmalene Hare, MD;  Location: [facility] ORS;  Service: General;  Laterality: N/A;   FISSURECTOMY  02/24/2017   Procedure: FISSURECTOMY;  Surgeon: Emmalene Hare, MD;  Location: [facility] ORS;  Service: General;;   HAND SURGERY Left 2020   HERNIA REPAIR Right 1991   INGUINAL HERNIA REPAIR Right 2012   Dr [facility] HERNIA REPAIR Right 2014   Dr Lorel Roes   LEFT HEART CATH AND CORONARY ANGIOGRAPHY Left 08/05/2020   Procedure: LEFT HEART CATH AND CORONARY ANGIOGRAPHY poss PCI;  Surgeon: Wenona Hamilton, MD;  Location: [facility] INVASIVE CV LAB;  Service: Cardiovascular;  Laterality: Left;   MUSCLE BIOPSY Left 12/31/2021   Procedure: LEFT VASTUS LATERALIS BIOPSY;  Surgeon: Jodeen Munch, MD;  Location: [facility] ORS;  Service: Neurosurgery;  Laterality: Left;   RIGHT/LEFT HEART CATH AND CORONARY ANGIOGRAPHY N/A 07/30/2021   Procedure: RIGHT/LEFT HEART CATH AND CORONARY ANGIOGRAPHY;  Surgeon: Wenona Hamilton, MD;  Location: [facility] INVASIVE CV LAB;  Service: Cardiovascular;  Laterality: N/A;   WRIST SURGERY Left    XI ROBOT ASSISTED DIAGNOSTIC LAPAROSCOPY Right 02/03/2023   Procedure: DIAGNOSTIC LAPAROSCOPY;  Surgeon: Emmalene Hare, MD;  Location: [facility] ORS;  Service: General;  Laterality: Right;     Current Outpatient Medications:    ACCU-CHEK GUIDE test strip, USE TO TEST BLOOD SUGAR THREE TIMES DAILY AS NEEDED, Disp: 100 strip, Rfl: 1   Accu-Chek Softclix Lancets lancets, TEST TWICE DAILY, Disp: 100 each, Rfl: 3   Alpha Lipoic Acid 200 MG CAPS, Take 200 mg by mouth daily., Disp: , Rfl:     aspirin  EC 81 MG tablet, Take by mouth., Disp: , Rfl:    Baclofen 5 MG TABS, Take 5 mg by mouth 2 (two) times daily as needed., Disp: , Rfl:    blood glucose meter kit and supplies, 1 each by Other route in the morning and at bedtime. Dispense based on patient and insurance preference. Use up to two  times daily. (FOR ICD-10 E10.9, E11.9)., Disp: 1 each, Rfl: 0   carvedilol  (COREG ) 6.25 MG tablet, TAKE 1 TABLET(6.25 MG) BY MOUTH TWICE DAILY, Disp: 180 tablet, Rfl: 1   Coenzyme Q10 (COQ-10) 100 MG CAPS, Take 100 mg by mouth daily., Disp: , Rfl:    ezetimibe  (ZETIA ) 10 MG tablet, Take 1 tablet (10 mg total) by mouth daily., Disp: 90 tablet, Rfl: 1   fluticasone  (FLONASE ) 50 MCG/ACT nasal spray, INSTILL 1 SPRAY INTO BOTH NOSTRILS AS NEEDED, Disp: 48 g, Rfl: 0  fluticasone  furoate-vilanterol (BREO ELLIPTA ) 100-25 MCG/ACT AEPB, INHALE 1 PUFF INTO THE LUNGS DAILY, Disp: 60 each, Rfl: 6   hydrocortisone  2.5 % cream, Apply to hyperpigmentation on face QD on Monday, Wednesday, and Friday PRN., Disp: 28 g, Rfl: 3   insulin  glargine (LANTUS  SOLOSTAR) 100 UNIT/ML Solostar Pen, Inject 5-10 Units into the skin daily., Disp: 15 mL, Rfl: PRN   Insulin  Pen Needle 32G X 6 MM MISC, 1 each by Does not apply route daily at 12 noon., Disp: 100 each, Rfl: 0   ketoconazole  (NIZORAL ) 2 % shampoo, Wash scalp/body/face QD on Tuesday, Thursday, and Saturday, let sit 5 minutes before rinsing off, avoid eyes., Disp: 120 mL, Rfl: 11   linaclotide (LINZESS) 145 MCG CAPS capsule, Take 1 capsule (145 mcg total) by mouth daily before breakfast., Disp: 90 capsule, Rfl: 0   losartan -hydrochlorothiazide  (HYZAAR) 50-12.5 MG tablet, Take 1 tablet by mouth daily., Disp: 90 tablet, Rfl: 1   meloxicam (MOBIC) 7.5 MG tablet, Take 7.5 mg by mouth daily., Disp: , Rfl:    nitroGLYCERIN  (NITROSTAT ) 0.4 MG SL tablet, Place 1 tablet (0.4 mg total) under the tongue every 5 (five) minutes as needed for chest pain., Disp: , Rfl:    ranolazine   (RANEXA ) 1000 MG SR tablet, Take 1 tablet (1,000 mg total) by mouth 2 (two) times daily., Disp: 180 tablet, Rfl: 0   Riboflavin (B-2) 100 MG TABS, Take 100 mg by mouth daily., Disp: , Rfl:    rosuvastatin  (CRESTOR ) 10 MG tablet, Take 1 tablet (10 mg total) by mouth daily., Disp: 90 tablet, Rfl: 1   sildenafil  (VIAGRA ) 100 MG tablet, TAKE 1 TABLET(100 MG) BY MOUTH DAILY AS NEEDED FOR ERECTILE DYSFUNCTION, Disp: 30 tablet, Rfl: 0   Sulfacetamide  Sodium 9.8 % SHAM, Shampoo into the face and trunk QD on Monday, Wednesday, and Friday. Let sit a few minutes before washing off., Disp: 120 mL, Rfl: 11   temazepam  (RESTORIL ) 15 MG capsule, Take 1 capsule (15 mg total) by mouth at bedtime., Disp: 90 capsule, Rfl: 0   tirzepatide (MOUNJARO) 7.5 MG/0.5ML Pen, Inject 7.5 mg into the skin once a week., Disp: 2 mL, Rfl: 0   Vitamin D , Ergocalciferol , (DRISDOL ) 1.25 MG (50000 UNIT) CAPS capsule, Take 1 capsule (50,000 Units total) by mouth every 7 (seven) days., Disp: 12 capsule, Rfl: 1   omeprazole  (PRILOSEC) 40 MG capsule, Take 1 capsule (40 mg total) by mouth 2 (two) times daily before a meal., Disp: 180 capsule, Rfl: 0   Family History  Problem Relation Age of Onset   Hypertension Mother    Heart Problems Mother    High Cholesterol Mother    Heart Problems Father 59       myocardial infarction   Mental illness Brother    Other Maternal Aunt        COVID   Lung cancer Maternal Aunt    Lung cancer Maternal Aunt    Cancer Maternal Uncle        unknown   Prostate cancer Maternal Uncle    Testicular cancer Paternal Uncle    Thyroid  disease Daughter    Breast cancer Cousin      Social History   Tobacco Use   Smoking status: Former    Types: Cigars    Start date: 12/28/2002    Quit date: 06/04/2005    Years since quitting: 18.9   Smokeless tobacco: Never   Tobacco comments:    quit 2006-smoked 1 cigar occ  Vaping Use  Vaping status: Never Used  Substance Use Topics   Alcohol use: Not Currently     Comment: has not had a drink in 8 months 02/2021   Drug use: No    Allergies as of 05/02/2024 - Review Complete 05/02/2024  Allergen Reaction Noted   Gabapentin  Other (See Comments) 04/16/2015   Latex Rash 06/20/2015   Penicillins Hives and Rash 03/23/2013   Cymbalta [duloxetine hcl]  01/04/2024    Review of Systems:    All systems reviewed and negative except where noted in HPI.   Physical Exam:  BP 132/83 (BP Location: Left Arm, Patient Position: Sitting, Cuff Size: Normal)   Pulse 64   Temp 97.7 F (36.5 C) (Oral)   Ht 5\' 11"  (1.803 m)   Wt 247 lb 4 oz (112.2 kg)   BMI 34.48 kg/m  No LMP for male patient.  General:   Alert,  Well-developed, well-nourished, pleasant and cooperative in NAD Head:  Normocephalic and atraumatic. Eyes:  Sclera clear, no icterus.   Conjunctiva pink. Ears:  Normal auditory acuity. Nose:  No deformity, discharge, or lesions. Mouth:  No deformity or lesions,oropharynx pink & moist. Neck:  Supple; no masses or thyromegaly. Lungs:  Respirations even and unlabored.  Clear throughout to auscultation.   No wheezes, crackles, or rhonchi. No acute distress. Heart:  Regular rate and rhythm; no murmurs, clicks, rubs, or gallops. Abdomen:  Normal bowel sounds. Soft, non-tender and moderately distended, tympanic without masses, hepatosplenomegaly or hernias noted.  No guarding or rebound tenderness.   Rectal: Not performed Msk:  Symmetrical without gross deformities. Good, equal movement & strength bilaterally. Pulses:  Normal pulses noted. Extremities:  No clubbing or edema.  No cyanosis. Neurologic:  Alert and oriented x3;  grossly normal neurologically. Skin:  Intact without significant lesions or rashes. No jaundice. Psych:  Alert and cooperative. Normal mood and affect.  Imaging Studies: Reviewed  Assessment and Plan:   [name] [name] is a 63 y.o. male with history of metabolic syndrome, history of mitochondrial myopathy, chronic neurogenic  atrophy, mild oropharyngeal dysphagia and history of weight loss.  Dysphagia and weight loss have resolved.  Patient was on Trulicity  in the past which has been discontinued.  Patient underwent extensive GI work-up including upper endoscopy with esophageal biopsies, esophageal manometry which were unremarkable.  CT abdomen and pelvis was normal.  Gastric emptying study was normal.  Speech pathology evaluation revealed mild oropharyngeal dysphagia.  Patient also has generalized muscle weakness, underwent muscle biopsy, pathology concerning for mitochondrial myopathy.  He is currently closely followed by [facility] neurology for chronic neurogenic atrophy and mitochondrial myopathy.  Patient has regained weight, and continues to gain weight with increasing BMI Presents for follow-up with new onset of constipation, abdominal bloating, early satiety, worsening diabetes, switched to from Trulicity  to Mounjaro in January 2025  Chronic constipation with abdominal bloating, early satiety Associated with weight gain, worsening diabetes, GLP-1 can also lead to GI dysmotility Recommend to start Linzess 145 mcg daily Advised portion control, weight loss, tight control of diabetes, strictly advised him to cut back on high starch containing foods Incorporate more fiber in diet, adequate intake of water  Chronic GERD with oropharyngeal dysphagia. Recent worsening of difficulty swallowing along with hoarseness of voice EGD has been normal in the past High-dose omeprazole  40 mg twice daily before meals improved his symptoms, continue the current dose Will repeat EGD to evaluate for any erosive esophagitis Advised him to follow-up with speech pathology  History of adenomas of  the colon Recommend surveillance colonoscopy in 04/2025  Follow up as needed   Karma Oz, MD

## 2024-05-03 ENCOUNTER — Encounter: Payer: Self-pay | Admitting: Gastroenterology

## 2024-05-03 NOTE — Telephone Encounter (Signed)
 Patient got it refilled for twice a day on 04/17/2024

## 2024-05-04 ENCOUNTER — Encounter: Payer: Self-pay | Admitting: Gastroenterology

## 2024-05-09 ENCOUNTER — Encounter: Payer: Self-pay | Admitting: Gastroenterology

## 2024-05-09 ENCOUNTER — Ambulatory Visit: Payer: Self-pay | Admitting: Anesthesiology

## 2024-05-09 ENCOUNTER — Other Ambulatory Visit: Payer: Self-pay

## 2024-05-09 ENCOUNTER — Encounter
Admission: RE | Disposition: A | Discharge status: Home / Self Care | Payer: Self-pay | Source: Home / Self Care | Attending: Gastroenterology

## 2024-05-09 ENCOUNTER — Ambulatory Visit
Admission: RE | Admit: 2024-05-09 | Discharge: 2024-05-09 | Disposition: A | Discharge status: Home / Self Care | Attending: Gastroenterology | Admitting: Gastroenterology

## 2024-05-09 DIAGNOSIS — Z87891 Personal history of nicotine dependence: Secondary | ICD-10-CM | POA: Diagnosis not present

## 2024-05-09 DIAGNOSIS — K219 Gastro-esophageal reflux disease without esophagitis: Secondary | ICD-10-CM | POA: Diagnosis not present

## 2024-05-09 DIAGNOSIS — R1319 Other dysphagia: Secondary | ICD-10-CM

## 2024-05-09 DIAGNOSIS — G4733 Obstructive sleep apnea (adult) (pediatric): Secondary | ICD-10-CM | POA: Diagnosis not present

## 2024-05-09 DIAGNOSIS — R1310 Dysphagia, unspecified: Secondary | ICD-10-CM | POA: Diagnosis not present

## 2024-05-09 DIAGNOSIS — Z794 Long term (current) use of insulin: Secondary | ICD-10-CM | POA: Insufficient documentation

## 2024-05-09 DIAGNOSIS — I25119 Atherosclerotic heart disease of native coronary artery with unspecified angina pectoris: Secondary | ICD-10-CM | POA: Diagnosis not present

## 2024-05-09 DIAGNOSIS — E119 Type 2 diabetes mellitus without complications: Secondary | ICD-10-CM | POA: Diagnosis not present

## 2024-05-09 DIAGNOSIS — I10 Essential (primary) hypertension: Secondary | ICD-10-CM | POA: Diagnosis not present

## 2024-05-09 DIAGNOSIS — Z7985 Long-term (current) use of injectable non-insulin antidiabetic drugs: Secondary | ICD-10-CM | POA: Diagnosis not present

## 2024-05-09 HISTORY — PX: ESOPHAGOGASTRODUODENOSCOPY: SHX5428

## 2024-05-09 HISTORY — DX: Other ill-defined heart diseases: I51.89

## 2024-05-09 LAB — GLUCOSE, CAPILLARY
Glucose-Capillary: 114 mg/dL — ABNORMAL HIGH (ref 70–99)
Glucose-Capillary: 119 mg/dL — ABNORMAL HIGH (ref 70–99)

## 2024-05-09 SURGERY — EGD (ESOPHAGOGASTRODUODENOSCOPY)
Anesthesia: General

## 2024-05-09 MED ORDER — KETAMINE HCL 50 MG/ML IJ SOLN
INTRAMUSCULAR | Status: AC
  Filled 2024-05-09: qty 1

## 2024-05-09 MED ORDER — SODIUM CHLORIDE 0.9 % IV SOLN: INTRAVENOUS | Status: DC

## 2024-05-09 MED ORDER — MIDAZOLAM HCL 2 MG/2ML IJ SOLN
INTRAMUSCULAR | Status: AC
Start: 2024-05-09 — End: ?
  Filled 2024-05-09: qty 2

## 2024-05-09 MED ORDER — DEXMEDETOMIDINE HCL IN NACL 200 MCG/50ML IV SOLN
INTRAVENOUS | Status: DC | PRN
Start: 2024-05-09 — End: 2024-05-09
  Administered 2024-05-09: 12 ug via INTRAVENOUS

## 2024-05-09 MED ORDER — KETAMINE HCL 10 MG/ML IJ SOLN
INTRAMUSCULAR | Status: DC | PRN
  Administered 2024-05-09: 30 mg via INTRAVENOUS

## 2024-05-09 MED ORDER — STERILE WATER FOR IRRIGATION IR SOLN
Status: DC | PRN
  Administered 2024-05-09: 1

## 2024-05-09 MED ORDER — MIDAZOLAM HCL 2 MG/2ML IJ SOLN
INTRAMUSCULAR | Status: DC | PRN
Start: 2024-05-09 — End: 2024-05-09
  Administered 2024-05-09: 2 mg via INTRAVENOUS

## 2024-05-09 MED ORDER — PROPOFOL 500 MG/50ML IV EMUL
INTRAVENOUS | Status: DC | PRN
Start: 2024-05-09 — End: 2024-05-09
  Administered 2024-05-09 (×2): 60 mg via INTRAVENOUS

## 2024-05-09 SURGICAL SUPPLY — 6 items
BLOCK BITE 60FR ADLT L/F GRN (MISCELLANEOUS) IMPLANT
GOWN CVR UNV OPN BCK APRN NK (MISCELLANEOUS) ×2 IMPLANT
KIT PRC NS LF DISP ENDO (KITS) ×1 IMPLANT
MANIFOLD NEPTUNE II (INSTRUMENTS) ×1 IMPLANT
WATER STERILE IRR 250ML POUR (IV SOLUTION) ×1 IMPLANT
WIRE CRE 18-20MM 8CM F G (MISCELLANEOUS) IMPLANT

## 2024-05-09 NOTE — Transfer of Care (Signed)
 Immediate Anesthesia Transfer of Care Note  Patient: [name] [name]  Procedure(s) Performed: EGD (ESOPHAGOGASTRODUODENOSCOPY)  Patient Location: PACU  Anesthesia Type:General  Level of Consciousness: awake and alert   Airway & Oxygen  Therapy: Patient Spontanous Breathing  Post-op Assessment: Report given to RN and Post -op Vital signs reviewed and stable  Post vital signs: Reviewed and stable  Last Vitals: Normal temp  Dr. Aldo Amble with pt in PACU and entire case.  Vitals Value Taken Time  BP 104/86 05/09/24 0919  Temp    Pulse 69 05/09/24 0920  Resp 18 05/09/24 0920  SpO2 94 % 05/09/24 0920  Vitals shown include unfiled device data.  Last Pain:  Vitals:   05/09/24 0757  TempSrc: Temporal  PainSc: 7          Complications: No notable events documented.

## 2024-05-09 NOTE — Op Note (Signed)
 [facility] Gastroenterology Patient Name: [name] [name] Procedure Date: 05/09/2024 8:47 AM MRN: [id number] Account #: [id number] Date of Birth: [birth year] Admit Type: Outpatient Age: 63 Room: [facility] OR ROOM 01 Gender: Male Note Status: Finalized Instrument Name: [id number] Procedure:             Upper GI endoscopy Indications:           Dysphagia Providers:             Selena Daily MD, MD Referring MD:          Lavonna Prader. Sowles, MD (Referring MD) Medicines:             General Anesthesia Complications:         No immediate complications. Estimated blood loss: None. Procedure:             Pre-Anesthesia Assessment:                        - Prior to the procedure, a History and Physical was                         performed, and patient medications and allergies were                         reviewed. The patient is competent. The risks and                         benefits of the procedure and the sedation options and                         risks were discussed with the patient. All questions                         were answered and informed consent was obtained.                         Patient identification and proposed procedure were                         verified by the physician, the nurse, the                         anesthesiologist, the anesthetist and the technician                         in the pre-procedure area in the procedure room in the                         endoscopy suite. Mental Status Examination: alert and                         oriented. Airway Examination: normal oropharyngeal                         airway and neck mobility. Respiratory Examination:                         clear to auscultation. CV Examination: normal.  Prophylactic Antibiotics: The patient does not require                         prophylactic antibiotics. Prior Anticoagulants: The                         patient has taken no anticoagulant or  antiplatelet                         agents. ASA Grade Assessment: III - A patient with                         severe systemic disease. After reviewing the risks and                         benefits, the patient was deemed in satisfactory                         condition to undergo the procedure. The anesthesia                         plan was to use general anesthesia. Immediately prior                         to administration of medications, the patient was                         re-assessed for adequacy to receive sedatives. The                         heart rate, respiratory rate, oxygen  saturations,                         blood pressure, adequacy of pulmonary ventilation, and                         response to care were monitored throughout the                         procedure. The physical status of the patient was                         re-assessed after the procedure.                        After obtaining informed consent, the endoscope was                         passed under direct vision. Throughout the procedure,                         the patient's blood pressure, pulse, and oxygen                          saturations were monitored continuously. The Endoscope                         was introduced through the mouth, and advanced to the  second part of duodenum. The upper GI endoscopy was                         accomplished without difficulty. The patient tolerated                         the procedure well. Findings:      The esophagus, gastroesophageal junction and examined esophagus were       normal.      The stomach was normal.      The examined duodenum was normal. Impression:            - Normal esophagus and gastroesophageal junction.                        - Normal stomach.                        - Normal examined duodenum.                        - No specimens collected. Recommendation:        - Discharge patient to home (with  escort).                        - Resume previous diet today.                        - Continue present medications.                        - Perform routine esophageal manometry at appointment                         to be scheduled. Procedure Code(s):     --- Professional ---                        [phone], Esophagogastroduodenoscopy, flexible,                         transoral; diagnostic, including collection of                         specimen(s) by brushing or washing, when performed                         (separate procedure) Diagnosis Code(s):     --- Professional ---                        R13.10, Dysphagia, unspecified CPT copyright 2022 [employer]. All rights reserved. The codes documented in this report are preliminary and upon coder review may  be revised to meet current compliance requirements. Dr. Evia Hof Selena Daily MD, MD 05/09/2024 9:12:14 AM This report has been signed electronically. Number of Addenda: 0 Note Initiated On: 05/09/2024 8:47 AM Total Procedure Duration: 0 hours 2 minutes 25 seconds  Estimated Blood Loss:  Estimated blood loss: none.      [facility]

## 2024-05-09 NOTE — H&P (Signed)
 Karma Oz, MD [address]  [address]  [place], [place] [address]  Main: [phone]  Fax: [phone] Pager: [phone]  Primary Care Physician:  Arleen Lacer, MD Primary Gastroenterologist:  Dr. Karma Oz  Pre-Procedure History & Physical: HPI:  [name] [name] is a 63 y.o. male is here for an endoscopy.   Past Medical History:  Diagnosis Date   Allergy    Angina, class III (HCC)    CAD (coronary artery disease)    a. 05/30/2015 cath: LM nl, mLAD 50% (FFR 0.83), LCx minor irregs, RCA minor irregs, EF 55-65%-->Med Rx; b. 03/2016 Cath: LM nl LAD 81m, D1/2/3 min irregs, LCX min irregs, OM1/2/3 nl, RCA min irregs, RPDA/RPAV/RPL1/RPL2 nl-->Med Rx; b. 07/2020 Cath: LM nl, LAD 26m, D1/2/3 min irregs, LCX min irregs, RCA min irregs, RPDA/RPAV/RPL1,2 nl, EF 55-65%.   Chronic constipation    Degeneration of intervertebral disc of cervical region    Diastolic dysfunction    a.) TTE 07/22/2017: EF 60-65%, G1DD; b.) TTE 02/14/2021: EF 60-65%, G1DD; c.) TTE 01/19/2023: EF 55-60%, AoV sclerosis without stenosis, G1DD   Dyspnea    Erectile dysfunction    a.) on PDE5i (sildenafil )   GERD (gastroesophageal reflux disease)    Grade I diastolic dysfunction    Hernia 1991   Hyperlipidemia    Hypertension 2008   Mitochondrial myopathy    a.) followed by medical genetics specialist (Dr. Evorn Holder, MD) at [facility]   Nerve root pain    Neuropathy    Obesity, unspecified 2012   OSA on CPAP    Personal history of tobacco use, presenting hazards to health 2012   Polycythemia    Rectal bleeding 02/11/2017   Recurrent Right Inguinal Hernia Repair 02/09/2011, 04/07/2013   Seizure (HCC)    pt states neurologist thinking he was having seizures while sleeping-eeg done 10-2022 and was normal   Sleep difficulties    a.) on BZO (temazepam ) PRN   T2DM (type 2 diabetes mellitus) (HCC)    Umbilical hernia without mention of obstruction or gangrene 02/09/2011   Vitamin D  deficiency      Past Surgical History:  Procedure Laterality Date   BACK SURGERY  12/2008   [facility]   CARDIAC CATHETERIZATION N/A 05/30/2015   Procedure: Left Heart Cath;  Surgeon: Wenona Hamilton, MD;  Location: [facility] INVASIVE CV LAB;  Service: Cardiovascular;  Laterality: N/A;   CARDIAC CATHETERIZATION N/A 04/16/2016   Procedure: Left Heart Cath and Coronary Angiography;  Surgeon: Wenona Hamilton, MD;  Location: [facility] INVASIVE CV [facility];  Service: Cardiovascular;  Laterality: N/A;   COLONOSCOPY  2014   Dr. Lorel Roes   COLONOSCOPY WITH PROPOFOL  N/A 12/25/2016   Procedure: COLONOSCOPY WITH PROPOFOL ;  Surgeon: Luke Salaam, MD;  Location: [facility] ENDOSCOPY;  Service: Endoscopy;  Laterality: N/A;   COLONOSCOPY WITH PROPOFOL  N/A 01/29/2017   Procedure: COLONOSCOPY WITH PROPOFOL ;  Surgeon: Luke Salaam, MD;  Location: [facility] ENDOSCOPY;  Service: Endoscopy;  Laterality: N/A;   COLONOSCOPY WITH PROPOFOL  N/A 04/11/2020   Procedure: COLONOSCOPY WITH PROPOFOL ;  Surgeon: Luke Salaam, MD;  Location: [facility] ENDOSCOPY;  Service: Gastroenterology;  Laterality: N/A;   CORONARY PRESSURE/FFR STUDY N/A 07/30/2021   Procedure: INTRAVASCULAR PRESSURE WIRE/FFR STUDY;  Surgeon: Wenona Hamilton, MD;  Location: [facility] CV LAB;  Service: Cardiovascular;  Laterality: N/A;   ESOPHAGEAL MANOMETRY N/A 03/04/2022   Procedure: ESOPHAGEAL MANOMETRY (EM);  Surgeon: Sergio Dandy, MD;  Location: [facility] ENDOSCOPY;  Service: Gastroenterology;  Laterality: N/A;   ESOPHAGOGASTRODUODENOSCOPY (EGD)  WITH PROPOFOL  N/A 01/26/2019   Procedure: ESOPHAGOGASTRODUODENOSCOPY (EGD) WITH PROPOFOL ;  Surgeon: Selena Daily, MD;  Location: [facility] ENDOSCOPY;  Service: Gastroenterology;  Laterality: N/A;   ESOPHAGOGASTRODUODENOSCOPY (EGD) WITH PROPOFOL  N/A 09/10/2021   Procedure: ESOPHAGOGASTRODUODENOSCOPY (EGD) WITH PROPOFOL ;  Surgeon: Selena Daily, MD;  Location: [facility] ENDOSCOPY;  Service: Gastroenterology;  Laterality: N/A;   EVALUATION UNDER  ANESTHESIA WITH HEMORRHOIDECTOMY N/A 02/24/2017   Procedure: EXAM UNDER ANESTHESIA WITH POSSIBLE EXCISION OF INTERNAL HEMORRHOIDS;  Surgeon: Emmalene Hare, MD;  Location: [facility] ORS;  Service: General;  Laterality: N/A;   FISSURECTOMY  02/24/2017   Procedure: FISSURECTOMY;  Surgeon: Emmalene Hare, MD;  Location: [facility] ORS;  Service: General;;   HAND SURGERY Left 2020   HERNIA REPAIR Right 1991   INGUINAL HERNIA REPAIR Right 2012   Dr [facility] HERNIA REPAIR Right 2014   Dr Lorel Roes   LEFT HEART CATH AND CORONARY ANGIOGRAPHY Left 08/05/2020   Procedure: LEFT HEART CATH AND CORONARY ANGIOGRAPHY poss PCI;  Surgeon: Wenona Hamilton, MD;  Location: [facility] CV [facility];  Service: Cardiovascular;  Laterality: Left;   MUSCLE BIOPSY Left 12/31/2021   Procedure: LEFT VASTUS LATERALIS BIOPSY;  Surgeon: Jodeen Munch, MD;  Location: [facility] ORS;  Service: Neurosurgery;  Laterality: Left;   RIGHT/LEFT HEART CATH AND CORONARY ANGIOGRAPHY N/A 07/30/2021   Procedure: RIGHT/LEFT HEART CATH AND CORONARY ANGIOGRAPHY;  Surgeon: Wenona Hamilton, MD;  Location: [facility];  Service: Cardiovascular;  Laterality: N/A;   WRIST SURGERY Left    XI ROBOT ASSISTED DIAGNOSTIC LAPAROSCOPY Right 02/03/2023   Procedure: DIAGNOSTIC LAPAROSCOPY;  Surgeon: Emmalene Hare, MD;  Location: [facility] ORS;  Service: General;  Laterality: Right;    Prior to Admission medications   Medication Sig Start Date End Date Taking? Authorizing Provider  Alpha Lipoic Acid 200 MG CAPS Take 200 mg by mouth daily.   Yes [provider]  aspirin  EC 81 MG tablet Take by mouth.   Yes [provider]  Baclofen 5 MG TABS Take 5 mg by mouth 2 (two) times daily as needed. 04/11/24  Yes [provider]  carvedilol  (COREG ) 6.25 MG tablet TAKE 1 TABLET(6.25 MG) BY MOUTH TWICE DAILY 01/28/22  Yes Sowles, Krichna, MD  Coenzyme Q10 (COQ-10) 100 MG CAPS Take 100 mg by mouth daily.   Yes [provider]  ezetimibe   (ZETIA ) 10 MG tablet Take 1 tablet (10 mg total) by mouth daily. 02/28/24  Yes Sowles, Krichna, MD  fluticasone  (FLONASE ) 50 MCG/ACT nasal spray INSTILL 1 SPRAY INTO BOTH NOSTRILS AS NEEDED 02/10/24  Yes Sowles, Krichna, MD  fluticasone  furoate-vilanterol (BREO ELLIPTA ) 100-25 MCG/ACT AEPB INHALE 1 PUFF INTO THE LUNGS DAILY 06/24/23  Yes Marc Senior, MD  hydrocortisone  2.5 % cream Apply to hyperpigmentation on face QD on Monday, Wednesday, and Friday PRN. 02/22/24  Yes Elta Halter, MD  insulin  glargine (LANTUS  SOLOSTAR) 100 UNIT/ML Solostar Pen Inject 5-10 Units into the skin daily. 01/14/24  Yes Sowles, Krichna, MD  ketoconazole  (NIZORAL ) 2 % shampoo Wash scalp/body/face QD on Tuesday, Thursday, and Saturday, let sit 5 minutes before rinsing off, avoid eyes. 02/22/24  Yes Elta Halter, MD  linaclotide  (LINZESS ) 145 MCG CAPS capsule Take 1 capsule (145 mcg total) by mouth daily before breakfast. 05/02/24  Yes Reily Treloar Reddy, MD  losartan -hydrochlorothiazide  (HYZAAR) 50-12.5 MG tablet Take 1 tablet by mouth daily. 02/28/24  Yes Sowles, Krichna, MD  meloxicam (MOBIC) 7.5 MG tablet Take 7.5 mg by mouth daily. 03/30/24  Yes [provider]  omeprazole  (PRILOSEC) 40 MG capsule Take 1 capsule (40 mg total) by mouth 2 (two) times daily before a meal. 05/02/24 07/31/24 Yes Claryce Friel, Elson Halon, MD  ranolazine  (RANEXA ) 1000 MG SR tablet Take 1 tablet (1,000 mg total) by mouth 2 (two) times daily. 04/21/24  Yes Wenona Hamilton, MD  Riboflavin (B-2) 100 MG TABS Take 100 mg by mouth daily.   Yes [provider]  rosuvastatin  (CRESTOR ) 10 MG tablet Take 1 tablet (10 mg total) by mouth daily. 02/28/24 02/22/25 Yes Sowles, Krichna, MD  sildenafil  (VIAGRA ) 100 MG tablet TAKE 1 TABLET(100 MG) BY MOUTH DAILY AS NEEDED FOR ERECTILE DYSFUNCTION 03/28/24  Yes Sowles, Krichna, MD  temazepam  (RESTORIL ) 15 MG capsule Take 1 capsule (15 mg total) by mouth at bedtime. 02/28/24  Yes Sowles, Krichna, MD   tirzepatide [facility]) 7.5 MG/0.5ML Pen Inject 7.5 mg into the skin once a week. 04/16/24  Yes Sowles, Krichna, MD  Vitamin D , Ergocalciferol , (DRISDOL ) 1.25 MG (50000 UNIT) CAPS capsule Take 1 capsule (50,000 Units total) by mouth every 7 (seven) days. 05/11/23  Yes Sowles, Krichna, MD  ACCU-CHEK GUIDE test strip USE TO TEST BLOOD SUGAR THREE TIMES DAILY AS NEEDED 03/19/23   Sowles, Krichna, MD  Accu-Chek Softclix Lancets lancets TEST TWICE DAILY 10/11/20   Sowles, Krichna, MD  blood glucose meter kit and supplies 1 each by Other route in the morning and at bedtime. Dispense based on patient and insurance preference. Use up to two  times daily. (FOR ICD-10 E10.9, E11.9). 10/09/20   Sowles, Krichna, MD  Insulin  Pen Needle 32G X 6 MM MISC 1 each by Does not apply route daily at 12 noon. 01/14/24   Sowles, Krichna, MD  nitroGLYCERIN  (NITROSTAT ) 0.4 MG SL tablet Place 1 tablet (0.4 mg total) under the tongue every 5 (five) minutes as needed for chest pain. 07/30/21   Wenona Hamilton, MD  Sulfacetamide  Sodium 9.8 % SHAM Shampoo into the face and trunk QD on Monday, Wednesday, and Friday. Let sit a few minutes before washing off. 08/04/23   Elta Halter, MD    Allergies as of 04/17/2024 - Review Complete 03/15/2024  Allergen Reaction Noted   Gabapentin  Other (See Comments) 04/16/2015   Latex Rash 06/20/2015   Penicillins Hives and Rash 03/23/2013   Cymbalta [duloxetine hcl]  01/04/2024    Family History  Problem Relation Age of Onset   Hypertension Mother    Heart Problems Mother    High Cholesterol Mother    Heart Problems Father 7       myocardial infarction   Mental illness Brother    Other Maternal Aunt        COVID   Lung cancer Maternal Aunt    Lung cancer Maternal Aunt    Cancer Maternal Uncle        unknown   Prostate cancer Maternal Uncle    Testicular cancer Paternal Uncle    Thyroid  disease Daughter    Breast cancer Cousin     Social History   Socioeconomic History    Marital status: Single    Spouse name: Not on file   Number of children: Not on file   Years of education: Not on file   Highest education level: Associate degree: occupational, Scientist, product/process development, or vocational program  Occupational History   Not on file  Tobacco Use   Smoking status: Former    Types: Cigars    Start date: 12/28/2002    Quit date:  06/04/2005    Years since quitting: 18.9   Smokeless tobacco: Never   Tobacco comments:    quit 2006-smoked 1 cigar occ  Vaping Use   Vaping status: Never Used  Substance and Sexual Activity   Alcohol use: Not Currently    Comment: has not had a drink in 8 months 02/2021   Drug use: No   Sexual activity: Yes    Partners: Female  Other Topics Concern   Not on file  Social History Narrative   Not on file   Social Drivers of Health   Financial Resource Strain: Low Risk  (01/12/2024)   Overall Financial Resource Strain (CARDIA)    Difficulty of Paying Living Expenses: Not hard at all  Food Insecurity: No Food Insecurity (01/12/2024)   Hunger Vital Sign    Worried About Running Out of Food in the Last Year: Never true    Ran Out of Food in the Last Year: Never true  Transportation Needs: No Transportation Needs (01/12/2024)   PRAPARE - Administrator, Civil Service (Medical): No    Lack of Transportation (Non-Medical): No  Physical Activity: Insufficiently Active (01/12/2024)   Exercise Vital Sign    Days of Exercise per Week: 1 day    Minutes of Exercise per Session: 10 min  Stress: No Stress Concern Present (01/12/2024)   [employer] of Occupational Health - Occupational Stress Questionnaire    Feeling of Stress : Only a little  Social Connections: Moderately Integrated (01/12/2024)   Social Connection and Isolation Panel [NHANES]    Frequency of Communication with Friends and Family: More than three times a week    Frequency of Social Gatherings with Friends and Family: Once a week    Attends Religious Services: More than 4  times per year    Active Member of [employer] or Organizations: No    Attends Banker Meetings: Not on file    Marital Status: Living with partner  Intimate Partner Violence: Not At Risk (06/12/2019)   Humiliation, Afraid, Rape, and Kick questionnaire    Fear of Current or Ex-Partner: No    Emotionally Abused: No    Physically Abused: No    Sexually Abused: No    Review of Systems: See HPI, otherwise negative ROS  Physical Exam: BP 128/76   Pulse 61   Temp 98 F (36.7 C) (Temporal)   Resp 16   Ht 5\' 11"  (1.803 m)   Wt 111.1 kg   SpO2 96%   BMI 34.17 kg/m  General:   Alert,  pleasant and cooperative in NAD Head:  Normocephalic and atraumatic. Neck:  Supple; no masses or thyromegaly. Lungs:  Clear throughout to auscultation.    Heart:  Regular rate and rhythm. Abdomen:  Soft, nontender and nondistended. Normal bowel sounds, without guarding, and without rebound.   Neurologic:  Alert and  oriented x4;  grossly normal neurologically.  Impression/Plan: [name] [name] is here for an endoscopy to be performed for dysphagia  Risks, benefits, limitations, and alternatives regarding  endoscopy have been reviewed with the patient.  Questions have been answered.  All parties agreeable.   Ellis Guys, MD  05/09/2024, 8:09 AM

## 2024-05-09 NOTE — Anesthesia Preprocedure Evaluation (Addendum)
 Anesthesia Evaluation  Patient identified by MRN, date of birth, ID band Patient awake    Reviewed: Allergy & Precautions, H&P , NPO status , Patient's Chart, lab work & pertinent test results  Airway Mallampati: III  TM Distance: >3 FB Neck ROM: Full    Dental no notable dental hx. (+) Upper Dentures   Pulmonary shortness of breath, asthma , sleep apnea , former smoker   Pulmonary exam normal breath sounds clear to auscultation       Cardiovascular hypertension, + angina  + CAD  Normal cardiovascular exam Rhythm:Regular Rate:Normal  01-19-23 echo  1. Left ventricular ejection fraction, by estimation, is 55 to 60%. The  left ventricle has normal function. The left ventricle has no regional  wall motion abnormalities. There is mild left ventricular hypertrophy.  Left ventricular diastolic parameters  are consistent with Grade I diastolic dysfunction (impaired relaxation).  The average left ventricular global longitudinal strain is -16.6 %.   2. Right ventricular systolic function is normal. The right ventricular  size is normal. Tricuspid regurgitation signal is inadequate for assessing  PA pressure.   3. The mitral valve is normal in structure. No evidence of mitral valve  regurgitation. No evidence of mitral stenosis.   4. The aortic valve is normal in structure. Aortic valve regurgitation is  not visualized. Aortic valve sclerosis is present, with no evidence of  aortic valve stenosis.   5. The inferior vena cava is normal in size with greater than 50%  respiratory variability, suggesting right atrial pressure of 3 mmHg.      Neuro/Psych  Headaches, Seizures -,   Neuromuscular disease negative neurological ROS  negative psych ROS   GI/Hepatic negative GI ROS, Neg liver ROS,GERD  ,,  Endo/Other  diabetes    Renal/GU negative Renal ROS  negative genitourinary   Musculoskeletal negative musculoskeletal ROS (+)  Arthritis ,    Abdominal   Peds negative pediatric ROS (+)  Hematology negative hematology ROS (+)   Anesthesia Other Findings Hernia  Personal history of tobacco use, presenting hazards to health Hypertension  Recurrent Right Inguinal Hernia Repair Obesity, unspecified  Umbilical hernia without mention of obstruction or gangrene T2DM (type 2 diabetes mellitus)  Hyperlipidemia Degeneration of intervertebral disc of cervical region CAD (coronary artery disease) Allergy  Angina, class III Chronic constipation  GERD (gastroesophageal reflux disease) Neuropathy  Polycythemia Nerve root pain  Vitamin D  deficiency OSA on CPAP  Dyspnea Diastolic dysfunction  Rectal bleeding Seizure Mitochondrial myopathy Erectile dysfunction  Sleep difficulties Grade I diastolic dysfunction  Patient brought phone number for [facility] specialists in Myocardial myopathy: Office hours: [phone] After hours:  [facility] operator [phone] Ask operator to call Pediatric Metabolic Genetics physician on all: [phone]  Will not have any lactated ringers, no succinylcholnie, will use very limited propofol , lidocaine , fentanyl , precedex , ketamine, will not allow patient to get temperature swings, too hot or too cold.  Discussed w/paitent and his wife.  Lovely people, very good and in-depth discussion.     Reproductive/Obstetrics negative OB ROS                             Anesthesia Physical Anesthesia Plan  ASA: 3  Anesthesia Plan: General   Post-op Pain Management:    Induction: Intravenous  PONV Risk Score and Plan:   Airway Management Planned: Natural Airway and Nasal Cannula  Additional Equipment:   Intra-op Plan:   Post-operative Plan:   Informed  Consent: I have reviewed the patients History and Physical, chart, labs and discussed the procedure including the risks, benefits and alternatives for the proposed anesthesia with the patient or  authorized representative who has indicated his/her understanding and acceptance.     Dental Advisory Given  Plan Discussed with: Anesthesiologist, CRNA and Surgeon  Anesthesia Plan Comments: (Patient consented for risks of anesthesia including but not limited to:  - adverse reactions to medications - risk of airway placement if required - damage to eyes, teeth, lips or other oral mucosa - nerve damage due to positioning  - sore throat or hoarseness - Damage to heart, brain, nerves, lungs, other parts of body or loss of life  Patient voiced understanding and assent. Anesthesiologist Dr. Aldo Amble will be in OR with CRNA for the entire procedure and accompany patient to PACU. )        Anesthesia Quick Evaluation

## 2024-05-09 NOTE — Anesthesia Postprocedure Evaluation (Signed)
 Anesthesia Post Note  Patient: [name] [name]  Procedure(s) Performed: EGD (ESOPHAGOGASTRODUODENOSCOPY)  Anesthesia Type: General Anesthetic complications: no Comments: Dr. Aldo Amble accompanied patient to OR/procedure room, and was in OR/procedure room with CRNA, entire case.  Patient did very well. Waking up in OR/procedure room at end of case, and did very well in PACU. Was awake and alert in PACU, discharged per wheelchair to car, accompanied by his wife, who will drive him home.  Alert, oriented x 3.  Breathing well. Genoveva Kidney MD    No notable events documented.   Last Vitals:  Vitals:   05/09/24 0918 05/09/24 0927  BP: 104/86 (!) 119/96  Pulse: 72 67  Resp: 14 (!) 9  Temp: (!) 36.2 C (!) 36.2 C  SpO2: 96% 94%    Last Pain:  Vitals:   05/09/24 0927  TempSrc:   PainSc: 0-No pain                 Emilie Harden

## 2024-05-11 DIAGNOSIS — M5412 Radiculopathy, cervical region: Secondary | ICD-10-CM | POA: Diagnosis not present

## 2024-05-15 ENCOUNTER — Ambulatory Visit

## 2024-05-15 ENCOUNTER — Encounter: Payer: Self-pay | Admitting: Family Medicine

## 2024-05-15 ENCOUNTER — Other Ambulatory Visit: Payer: Self-pay | Admitting: Family Medicine

## 2024-05-15 DIAGNOSIS — E538 Deficiency of other specified B group vitamins: Secondary | ICD-10-CM

## 2024-05-15 DIAGNOSIS — M5412 Radiculopathy, cervical region: Secondary | ICD-10-CM | POA: Diagnosis not present

## 2024-05-15 DIAGNOSIS — R531 Weakness: Secondary | ICD-10-CM | POA: Diagnosis not present

## 2024-05-15 MED ORDER — CYANOCOBALAMIN 1000 MCG/ML IJ SOLN
1000.0000 ug | Freq: Once | INTRAMUSCULAR | Status: AC
  Administered 2024-05-15: 1000 ug via INTRAMUSCULAR

## 2024-05-15 NOTE — Progress Notes (Signed)
 Patient is in office today for a nurse visit for B12 Injection. Patient Injection was given in the  Left deltoid. Patient tolerated injection well.

## 2024-05-16 ENCOUNTER — Encounter: Payer: Self-pay | Admitting: Family Medicine

## 2024-05-16 ENCOUNTER — Ambulatory Visit: Admitting: Family Medicine

## 2024-05-16 VITALS — BP 136/86 | HR 65 | Resp 18 | Ht 71.0 in | Wt 244.9 lb

## 2024-05-16 DIAGNOSIS — I209 Angina pectoris, unspecified: Secondary | ICD-10-CM

## 2024-05-16 DIAGNOSIS — E1159 Type 2 diabetes mellitus with other circulatory complications: Secondary | ICD-10-CM

## 2024-05-16 DIAGNOSIS — E8840 Mitochondrial metabolism disorder, unspecified: Secondary | ICD-10-CM | POA: Diagnosis not present

## 2024-05-16 DIAGNOSIS — I152 Hypertension secondary to endocrine disorders: Secondary | ICD-10-CM

## 2024-05-16 DIAGNOSIS — E559 Vitamin D deficiency, unspecified: Secondary | ICD-10-CM | POA: Diagnosis not present

## 2024-05-16 DIAGNOSIS — B370 Candidal stomatitis: Secondary | ICD-10-CM

## 2024-05-16 MED ORDER — TIRZEPATIDE 7.5 MG/0.5ML ~~LOC~~ SOAJ: 7.5000 mg | SUBCUTANEOUS | 0 refills | Status: DC

## 2024-05-16 MED ORDER — NYSTATIN 100000 UNIT/ML MT SUSP: 5.0000 mL | Freq: Four times a day (QID) | OROMUCOSAL | 0 refills | Status: DC

## 2024-05-16 NOTE — Progress Notes (Signed)
 Name: [name] [name]   MRN: [id number]    DOB: 1961/10/07   Date:05/16/2024       Progress Note  Subjective  Chief Complaint  Chief Complaint  Patient presents with   Chest Pain    Chest tightness, about a week   mouth problem    Mouth and tongue sore, white patch    Dehydration   Discussed the use of AI scribe software for clinical note transcription with the patient, who gave verbal consent to proceed.  History of Present Illness [name] [name] is a 63 year old male with angina who presents with chest pain and shortness of breath.  He has been experiencing chest pain for the past week, described as a twisting sensation radiating down his arm. The pain worsened after receiving propofol  for a recent endoscopic procedure. It occurs with physical activity, requiring him to sit down, and is associated with cold sweats and shortness of breath. These episodes happen about four to five times a day, each lasting approximately 15 seconds, with a total duration of about a minute per episode.  He is hoarse and has a white tongue, which is sore when consuming hot or cold foods. He has not taken antibiotics recently but received propofol . He uses a CPAP machine at night and has been experiencing a persistent cough for about a year, with no clear diagnosis from previous evaluations by pulmonology and gastroenterology.  He is managing his diabetes with a current A1c of 7.5 and reports stable blood sugar levels. He has lost weight, currently weighing 244 pounds, down from 253 pounds in March. He is not currently using Lantus  but keeps it available for potential corticosteroid use.  He experiences knee and shoulder pain, with a history of a fall affecting his shoulder. He is undergoing physical therapy for his left shoulder, which previously had pain radiating down but now feels deeper and cramping. He is considering knee injections but is hesitant about knee replacement surgery.  He reports vitamin D   deficiency and dehydration, with a recent history of feeling thirsty and dehydrated after treatments. He drinks water  regularly and avoids sodas and Gatorade.    Patient Active Problem List   Diagnosis Date Noted   Bilateral carpal tunnel syndrome 05/11/2023   Dyslipidemia associated with type 2 diabetes mellitus (HCC) 05/11/2023   Right groin pain 02/03/2023   Neurogenic muscular atrophy 05/08/2022   Other insomnia 05/08/2022   Controlled type 2 diabetes mellitus with microalbuminuria, without long-term current use of insulin  (HCC) 05/08/2022   Contraindication to statin medication 05/08/2022   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HCC) 01/22/2022   Tremor 01/22/2022   Shortness of breath 01/22/2022   Muscle atrophy of lower extremity 11/10/2021   Dysphagia    Asthma 05/14/2021   Multiple thyroid  nodules 05/14/2021   Localized, primary osteoarthritis of hand 09/28/2019   Seizure-like activity (HCC) 02/22/2018   Mild cognitive impairment 11/17/2017   Radiculopathy of cervical region 11/17/2017   Stenosis of carotid artery 09/08/2017   Cervical stenosis of spine 09/08/2017   B12 deficiency 06/28/2017   Headache disorder 02/15/2017   Memory loss or impairment 02/15/2017   Coronary artery disease involving native coronary artery of native heart    Radiculitis of left cervical region 03/05/2016   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) 12/05/2015   Patellar subluxation 06/05/2015   Allergic rhinitis, seasonal 06/05/2015   Chronic constipation 06/05/2015   Chronic lower back pain 06/05/2015   Vitamin D  deficiency 06/05/2015   Central  sleep apnea 06/05/2015   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   Nerve root pain 06/05/2015   Acquired polycythemia 06/05/2015   Anterior knee pain 06/05/2015   Failure of erection 06/05/2015   Gastro-esophageal reflux disease without esophagitis 06/05/2015   Neuropathy 06/05/2015   Angina, class III (HCC) 05/23/2015   Essential hypertension  05/23/2015   Hyperlipidemia 05/23/2015   Inguinal hernia without mention of obstruction or gangrene, recurrent unilateral or unspecified 03/24/2013    Past Surgical History:  Procedure Laterality Date   BACK SURGERY  12/2008   X2-LUMBAR   CARDIAC CATHETERIZATION N/A 05/30/2015   Procedure: Left Heart Cath;  Surgeon: Wenona Hamilton, MD;  Location: [facility];  Service: Cardiovascular;  Laterality: N/A;   CARDIAC CATHETERIZATION N/A 04/16/2016   Procedure: Left Heart Cath and Coronary Angiography;  Surgeon: Wenona Hamilton, MD;  Location: [facility];  Service: Cardiovascular;  Laterality: N/A;   COLONOSCOPY  2014   Dr. Lorel Roes   COLONOSCOPY WITH PROPOFOL  N/A 12/25/2016   Procedure: COLONOSCOPY WITH PROPOFOL ;  Surgeon: Luke Salaam, MD;  Location: [facility];  Service: Endoscopy;  Laterality: N/A;   COLONOSCOPY WITH PROPOFOL  N/A 01/29/2017   Procedure: COLONOSCOPY WITH PROPOFOL ;  Surgeon: Luke Salaam, MD;  Location: [facility];  Service: Endoscopy;  Laterality: N/A;   COLONOSCOPY WITH PROPOFOL  N/A 04/11/2020   Procedure: COLONOSCOPY WITH PROPOFOL ;  Surgeon: Luke Salaam, MD;  Location: [facility];  Service: Gastroenterology;  Laterality: N/A;   CORONARY PRESSURE/FFR STUDY N/A 07/30/2021   Procedure: INTRAVASCULAR PRESSURE WIRE/FFR STUDY;  Surgeon: Wenona Hamilton, MD;  Location: [facility];  Service: Cardiovascular;  Laterality: N/A;   ESOPHAGEAL MANOMETRY N/A 03/04/2022   Procedure: ESOPHAGEAL MANOMETRY (EM);  Surgeon: Sergio Dandy, MD;  Location: [facility];  Service: Gastroenterology;  Laterality: N/A;   ESOPHAGOGASTRODUODENOSCOPY N/A 05/09/2024   Procedure: EGD (ESOPHAGOGASTRODUODENOSCOPY);  Surgeon: Selena Daily, MD;  Location: [facility];  Service: Endoscopy;  Laterality: N/A;   ESOPHAGOGASTRODUODENOSCOPY (EGD) WITH PROPOFOL  N/A 01/26/2019   Procedure: ESOPHAGOGASTRODUODENOSCOPY (EGD) WITH PROPOFOL ;  Surgeon: Selena Daily, MD;  Location: [facility];  Service: Gastroenterology;  Laterality: N/A;   ESOPHAGOGASTRODUODENOSCOPY (EGD) WITH PROPOFOL  N/A 09/10/2021   Procedure: ESOPHAGOGASTRODUODENOSCOPY (EGD) WITH PROPOFOL ;  Surgeon: Selena Daily, MD;  Location: [facility];  Service: Gastroenterology;  Laterality: N/A;   EVALUATION UNDER ANESTHESIA WITH HEMORRHOIDECTOMY N/A 02/24/2017   Procedure: EXAM UNDER ANESTHESIA WITH POSSIBLE EXCISION OF INTERNAL HEMORRHOIDS;  Surgeon: Emmalene Hare, MD;  Location: [facility];  Service: General;  Laterality: N/A;   FISSURECTOMY  02/24/2017   Procedure: FISSURECTOMY;  Surgeon: Emmalene Hare, MD;  Location: [facility];  Service: General;;   HAND SURGERY Left 2020   HERNIA REPAIR Right 1991   INGUINAL HERNIA REPAIR Right 2012   Dr Skeet Duke HERNIA REPAIR Right 2014   Dr Lorel Roes   LEFT HEART CATH AND CORONARY ANGIOGRAPHY Left 08/05/2020   Procedure: LEFT HEART CATH AND CORONARY ANGIOGRAPHY poss PCI;  Surgeon: Wenona Hamilton, MD;  Location: [facility];  Service: Cardiovascular;  Laterality: Left;   MUSCLE BIOPSY Left 12/31/2021   Procedure: LEFT VASTUS LATERALIS BIOPSY;  Surgeon: Jodeen Munch, MD;  Location: [facility];  Service: Neurosurgery;  Laterality: Left;   RIGHT/LEFT HEART CATH AND CORONARY ANGIOGRAPHY N/A 07/30/2021   Procedure: RIGHT/LEFT HEART CATH AND CORONARY ANGIOGRAPHY;  Surgeon: Wenona Hamilton, MD;  Location: [facility];  Service: Cardiovascular;  Laterality: N/A;   WRIST SURGERY Left  XI ROBOT ASSISTED DIAGNOSTIC LAPAROSCOPY Right 02/03/2023   Procedure: DIAGNOSTIC LAPAROSCOPY;  Surgeon: Emmalene Hare, MD;  Location: [facility];  Service: General;  Laterality: Right;    Family History  Problem Relation Age of Onset   Hypertension Mother    Heart Problems Mother    High Cholesterol Mother    Heart Problems Father 64       myocardial infarction   Mental illness Brother    Other Maternal Aunt        COVID    Lung cancer Maternal Aunt    Lung cancer Maternal Aunt    Cancer Maternal Uncle        unknown   Prostate cancer Maternal Uncle    Testicular cancer Paternal Uncle    Thyroid  disease Daughter    Breast cancer Cousin     Social History   Tobacco Use   Smoking status: Former    Types: Cigars    Start date: 12/28/2002    Quit date: 06/04/2005    Years since quitting: 18.9   Smokeless tobacco: Never   Tobacco comments:    quit 2006-smoked 1 cigar occ  Substance Use Topics   Alcohol use: Not Currently    Comment: has not had a drink in 8 months 02/2021     Current Outpatient Medications:    ACCU-CHEK GUIDE test strip, USE TO TEST BLOOD SUGAR THREE TIMES DAILY AS NEEDED, Disp: 100 strip, Rfl: 1   Accu-Chek Softclix Lancets lancets, TEST TWICE DAILY, Disp: 100 each, Rfl: 3   Alpha Lipoic Acid 200 MG CAPS, Take 200 mg by mouth daily., Disp: , Rfl:    aspirin  EC 81 MG tablet, Take by mouth., Disp: , Rfl:    Baclofen 5 MG TABS, Take 5 mg by mouth 2 (two) times daily as needed., Disp: , Rfl:    blood glucose meter kit and supplies, 1 each by Other route in the morning and at bedtime. Dispense based on patient and insurance preference. Use up to two  times daily. (FOR ICD-10 E10.9, E11.9)., Disp: 1 each, Rfl: 0   carvedilol  (COREG ) 6.25 MG tablet, TAKE 1 TABLET(6.25 MG) BY MOUTH TWICE DAILY, Disp: 180 tablet, Rfl: 1   Coenzyme Q10 (COQ-10) 100 MG CAPS, Take 100 mg by mouth daily., Disp: , Rfl:    ezetimibe  (ZETIA ) 10 MG tablet, Take 1 tablet (10 mg total) by mouth daily., Disp: 90 tablet, Rfl: 1   fluticasone  (FLONASE ) 50 MCG/ACT nasal spray, INSTILL 1 SPRAY INTO BOTH NOSTRILS AS NEEDED, Disp: 48 g, Rfl: 0   fluticasone  furoate-vilanterol (BREO ELLIPTA ) 100-25 MCG/ACT AEPB, INHALE 1 PUFF INTO THE LUNGS DAILY, Disp: 60 each, Rfl: 6   hydrocortisone  2.5 % cream, Apply to hyperpigmentation on face QD on Monday, Wednesday, and Friday PRN., Disp: 28 g, Rfl: 3   insulin  glargine (LANTUS  SOLOSTAR)  100 UNIT/ML Solostar Pen, Inject 5-10 Units into the skin daily., Disp: 15 mL, Rfl: PRN   Insulin  Pen Needle 32G X 6 MM MISC, 1 each by Does not apply route daily at 12 noon., Disp: 100 each, Rfl: 0   ketoconazole  (NIZORAL ) 2 % shampoo, Wash scalp/body/face QD on Tuesday, Thursday, and Saturday, let sit 5 minutes before rinsing off, avoid eyes., Disp: 120 mL, Rfl: 11   linaclotide  (LINZESS ) 145 MCG CAPS capsule, Take 1 capsule (145 mcg total) by mouth daily before breakfast., Disp: 90 capsule, Rfl: 0   losartan -hydrochlorothiazide  (HYZAAR) 50-12.5 MG tablet, Take 1 tablet by mouth daily., Disp: 90 tablet,  Rfl: 1   meloxicam (MOBIC) 7.5 MG tablet, Take 7.5 mg by mouth daily., Disp: , Rfl:    nitroGLYCERIN  (NITROSTAT ) 0.4 MG SL tablet, Place 1 tablet (0.4 mg total) under the tongue every 5 (five) minutes as needed for chest pain., Disp: , Rfl:    omeprazole  (PRILOSEC) 40 MG capsule, Take 1 capsule (40 mg total) by mouth 2 (two) times daily before a meal., Disp: 180 capsule, Rfl: 0   ranolazine  (RANEXA ) 1000 MG SR tablet, Take 1 tablet (1,000 mg total) by mouth 2 (two) times daily., Disp: 180 tablet, Rfl: 0   Riboflavin (B-2) 100 MG TABS, Take 100 mg by mouth daily., Disp: , Rfl:    rosuvastatin  (CRESTOR ) 10 MG tablet, Take 1 tablet (10 mg total) by mouth daily., Disp: 90 tablet, Rfl: 1   sildenafil  (VIAGRA ) 100 MG tablet, TAKE 1 TABLET(100 MG) BY MOUTH DAILY AS NEEDED FOR ERECTILE DYSFUNCTION, Disp: 30 tablet, Rfl: 0   Sulfacetamide  Sodium 9.8 % SHAM, Shampoo into the face and trunk QD on Monday, Wednesday, and Friday. Let sit a few minutes before washing off., Disp: 120 mL, Rfl: 11   temazepam  (RESTORIL ) 15 MG capsule, Take 1 capsule (15 mg total) by mouth at bedtime., Disp: 90 capsule, Rfl: 0   tirzepatide (MOUNJARO) 7.5 MG/0.5ML Pen, Inject 7.5 mg into the skin once a week., Disp: 2 mL, Rfl: 0   Vitamin D , Ergocalciferol , (DRISDOL ) 1.25 MG (50000 UNIT) CAPS capsule, Take 1 capsule (50,000 Units  total) by mouth every 7 (seven) days., Disp: 12 capsule, Rfl: 1  Allergies  Allergen Reactions   Gabapentin  Other (See Comments)    Groggy-Mood Changes   Latex Rash   Penicillins Hives and Rash    Tolerated 1st generation cephalosporin (CEFAZOLIN ) on 12/31/2021 with no documented ADRs.  PCN reaction causing immediate rash, facial/tongue/throat swelling, SOB or lightheadedness with hypotension: Yes PCN reaction causing severe rash involving mucus membranes or skin necrosis: No PCN reaction that required hospitalization No PCN reaction occurring within the last 10 years: No If all of the above answers are "NO", then may proceed with Cephalosporin use.    Cymbalta [Duloxetine Hcl]     Elevated BP and bad headache    I personally reviewed active problem list, medication list, allergies, family history with the patient/caregiver today.   ROS  Ten systems reviewed and is negative except as mentioned in HPI    Objective Physical Exam  CONSTITUTIONAL: Patient appears well-developed and well-nourished. No distress. HEENT: Head atraumatic, normocephalic, neck supple. CARDIOVASCULAR: Normal rate, regular rhythm and normal heart sounds. No murmur heard. No BLE edema. PULMONARY: Effort normal and breath sounds normal. No respiratory distress. Lungs clear to auscultation bilaterally. PSYCHIATRIC: Patient has a normal mood and affect. Behavior is normal. Judgment and thought content normal. NEUROLOGICAL: Cranial nerves grossly intact.  Vitals:   05/16/24 0813  BP: 136/86  Pulse: 65  Resp: 18  SpO2: 97%  Weight: 244 lb 14.4 oz (111.1 kg)  Height: 5\' 11"  (1.803 m)    Body mass index is 34.16 kg/m.  Recent Results (from the past 2160 hours)  POCT glycosylated hemoglobin (Hb A1C)     Status: Abnormal   Collection Time: 02/28/24  8:54 AM  Result Value Ref Range   Hemoglobin A1C 8.2 (A) 4.0 - 5.6 %   HbA1c POC (<> result, manual entry)     HbA1c, POC (prediabetic range)     HbA1c,  POC (controlled diabetic range)    Glucose, capillary  Status: Abnormal   Collection Time: 05/09/24  7:57 AM  Result Value Ref Range   Glucose-Capillary 114 (H) 70 - 99 mg/dL    Comment: Glucose reference range applies only to samples taken after fasting for at least 8 hours.  Glucose, capillary     Status: Abnormal   Collection Time: 05/09/24  9:23 AM  Result Value Ref Range   Glucose-Capillary 119 (H) 70 - 99 mg/dL    Comment: Glucose reference range applies only to samples taken after fasting for at least 8 hours.     PHQ2/9:    05/16/2024    8:18 AM 02/21/2024   11:12 AM 01/14/2024    1:20 PM 11/29/2023    8:55 AM 10/27/2023   10:27 AM  Depression screen PHQ 2/9  Decreased Interest 0 0 0 0 0  Down, Depressed, Hopeless 0 0 0 0 0  PHQ - 2 Score 0 0 0 0 0  Altered sleeping  0 0 0 0  Tired, decreased energy  0 0 0 0  Change in appetite  0 0 0 0  Feeling bad or failure about yourself   0 0 0 0  Trouble concentrating  0 0 0 0  Moving slowly or fidgety/restless  0 0 0 0  Suicidal thoughts  0 0 0 0  PHQ-9 Score  0 0 0 0  Difficult doing work/chores  Not difficult at all Not difficult at all Not difficult at all     phq 9 is negative  Fall Risk:    05/16/2024    8:17 AM 01/14/2024    1:20 PM 11/29/2023    8:49 AM 10/27/2023   10:27 AM 08/23/2023    9:08 AM  Fall Risk   Falls in the past year? 1 1 1  0 1  Number falls in past yr: 1 1 1  0 1  Injury with Fall? 1 1 0 0 0  Risk for fall due to : Impaired balance/gait Impaired balance/gait Impaired balance/gait No Fall Risks Impaired balance/gait  Follow up Education provided;Falls evaluation completed;Falls prevention discussed Falls prevention discussed;Education provided;Falls evaluation completed Falls prevention discussed;Education provided;Falls evaluation completed Falls prevention discussed Falls prevention discussed;Education provided;Falls evaluation completed     Assessment & Plan Angina Intermittent chest pain with  dyspnea and diaphoresis, exacerbated by activity. EKG unchanged with inverted, depressed T wavers on V5. Differential includes cardiac ischemia, less likely muscular skeletal or GI related  - Order cardiac enzymes including troponin and CKMB , he refused going to [facility] labs  - Perform EKG and fax results to cardiologist. - Contact cardiologist Dr. Arivas for symptom discussion and potential earlier appointment.  Type 2 diabetes mellitus Blood glucose well-controlled. Recent weight loss to 244 lbs. A1c not due until June. Satisfied with current dose and reports good glycemic control. - Continue current diabetes medication regimen at 7.5 mg dose. - Provide one-month prescription for diabetes medication to reassess at next visit. - Keep Lantus  prescription available for potential future use if corticosteroid injections are needed.  Mitochondrial disease Chronic condition with muscle mass loss.  Vitamin D  deficiency Previous low vitamin D  levels. Reassessment needed. - Order vitamin D  level.  Oral candidiasis White patches in oral cavity, sore with temperature extremes. No recent antibiotic use. Possible oral candidiasis. - Prescribe antifungal swish and spit treatment.

## 2024-05-17 ENCOUNTER — Ambulatory Visit: Payer: Self-pay | Admitting: Family Medicine

## 2024-05-17 LAB — COMPREHENSIVE METABOLIC PANEL WITH GFR
AG Ratio: 1.6 (calc) (ref 1.0–2.5)
ALT: 25 U/L (ref 9–46)
AST: 16 U/L (ref 10–35)
Albumin: 4.4 g/dL (ref 3.6–5.1)
Alkaline phosphatase (APISO): 58 U/L (ref 35–144)
BUN: 9 mg/dL (ref 7–25)
CO2: 28 mmol/L (ref 20–32)
Calcium: 9.4 mg/dL (ref 8.6–10.3)
Chloride: 101 mmol/L (ref 98–110)
Creat: 1.09 mg/dL (ref 0.70–1.35)
Globulin: 2.7 g/dL (ref 1.9–3.7)
Glucose, Bld: 88 mg/dL (ref 65–99)
Potassium: 3.4 mmol/L — ABNORMAL LOW (ref 3.5–5.3)
Sodium: 140 mmol/L (ref 135–146)
Total Bilirubin: 0.7 mg/dL (ref 0.2–1.2)
Total Protein: 7.1 g/dL (ref 6.1–8.1)
eGFR: 77 mL/min/{1.73_m2} (ref >=60)

## 2024-05-17 LAB — LIPID PANEL
Cholesterol: 131 mg/dL (ref <200)
HDL: 49 mg/dL (ref >=40)
LDL Cholesterol (Calc): 62 mg/dL
Non-HDL Cholesterol (Calc): 82 mg/dL (ref <130)
Total CHOL/HDL Ratio: 2.7 (calc) (ref <5.0)
Triglycerides: 122 mg/dL (ref <150)

## 2024-05-17 LAB — CBC WITH DIFFERENTIAL/PLATELET
Absolute Lymphocytes: 2957 {cells}/uL (ref 850–3900)
Absolute Monocytes: 672 {cells}/uL (ref 200–950)
Basophils Absolute: 49 {cells}/uL (ref 0–200)
Basophils Relative: 0.6 %
Eosinophils Absolute: 73 {cells}/uL (ref 15–500)
Eosinophils Relative: 0.9 %
HCT: 49.6 % (ref 38.5–50.0)
Hemoglobin: 16.8 g/dL (ref 13.2–17.1)
MCH: 32.2 pg (ref 27.0–33.0)
MCHC: 33.9 g/dL (ref 32.0–36.0)
MCV: 95.2 fL (ref 80.0–100.0)
MPV: 9.6 fL (ref 7.5–12.5)
Monocytes Relative: 8.3 %
Neutro Abs: 4350 {cells}/uL (ref 1500–7800)
Neutrophils Relative %: 53.7 %
Platelets: 224 10*3/uL (ref 140–400)
RBC: 5.21 10*6/uL (ref 4.20–5.80)
RDW: 12.4 % (ref 11.0–15.0)
Total Lymphocyte: 36.5 %
WBC: 8.1 10*3/uL (ref 3.8–10.8)

## 2024-05-17 LAB — CK TOTAL AND CKMB (NOT AT ARMC)
CK, MB: <0.7 ng/mL (ref 0–5.0)
Total CK: 163 U/L (ref 22–308)

## 2024-05-17 LAB — VITAMIN D 25 HYDROXY (VIT D DEFICIENCY, FRACTURES): Vit D, 25-Hydroxy: 76 ng/mL (ref 30–100)

## 2024-05-17 LAB — TROPONIN I: Troponin I: 9 ng/L (ref <=47)

## 2024-05-18 ENCOUNTER — Ambulatory Visit: Attending: Nurse Practitioner | Admitting: Nurse Practitioner

## 2024-05-18 ENCOUNTER — Encounter: Payer: Self-pay | Admitting: Nurse Practitioner

## 2024-05-18 VITALS — BP 110/58 | HR 70 | Ht 71.0 in | Wt 244.8 lb

## 2024-05-18 DIAGNOSIS — E118 Type 2 diabetes mellitus with unspecified complications: Secondary | ICD-10-CM | POA: Insufficient documentation

## 2024-05-18 DIAGNOSIS — G4733 Obstructive sleep apnea (adult) (pediatric): Secondary | ICD-10-CM | POA: Diagnosis not present

## 2024-05-18 DIAGNOSIS — I25119 Atherosclerotic heart disease of native coronary artery with unspecified angina pectoris: Secondary | ICD-10-CM | POA: Insufficient documentation

## 2024-05-18 DIAGNOSIS — R072 Precordial pain: Secondary | ICD-10-CM | POA: Insufficient documentation

## 2024-05-18 DIAGNOSIS — Z794 Long term (current) use of insulin: Secondary | ICD-10-CM | POA: Diagnosis not present

## 2024-05-18 DIAGNOSIS — E782 Mixed hyperlipidemia: Secondary | ICD-10-CM | POA: Diagnosis not present

## 2024-05-18 DIAGNOSIS — I10 Essential (primary) hypertension: Secondary | ICD-10-CM | POA: Insufficient documentation

## 2024-05-18 DIAGNOSIS — R490 Dysphonia: Secondary | ICD-10-CM | POA: Diagnosis not present

## 2024-05-18 DIAGNOSIS — E8840 Mitochondrial metabolism disorder, unspecified: Secondary | ICD-10-CM | POA: Diagnosis not present

## 2024-05-18 MED ORDER — METOPROLOL TARTRATE 50 MG PO TABS: ORAL_TABLET | ORAL | 0 refills | Status: DC

## 2024-05-18 NOTE — Patient Instructions (Signed)
 Medication Instructions:  Your physician recommends that you continue on your current medications as directed. Please refer to the Current Medication list given to you today.   *If you need a refill on your cardiac medications before your next appointment, please call your pharmacy*  Lab Work: No labs ordered today   Testing/Procedures:   Your cardiac CT will be scheduled at one of the below locations:   [facility] [address] [place], [place] [address] [phone]  OR   [facility] [address] [place], [place] [address] [phone]   If scheduled at [facility] or [facility], please arrive 15 mins early for check-in and test prep.  There is spacious parking and easy access to the radiology department from the [facility] entrance. Please enter here and check-in with the desk attendant.   Please follow these instructions carefully (unless otherwise directed):  An IV will be required for this test and Nitroglycerin  will be given.  Hold all erectile dysfunction medications at least 3 days (72 hrs) prior to test. (Ie viagra , cialis, sildenafil , tadalafil, etc)   On the Night Before the Test: Be sure to Drink plenty of water . Do not consume any caffeinated/decaffeinated beverages or chocolate 12 hours prior to your test. Do not take any antihistamines 12 hours prior to your test.  On the Day of the Test: Drink plenty of water  until 1 hour prior to the test. Do not eat any food 1 hour prior to test. You may take your regular medications prior to the test.  Take metoprolol (Lopressor) 50 mg in addition to Carvedilol  6.25 mg two hours prior to test. If you take Furosemide/Hydrochlorothiazide /Spironolactone/Chlorthalidone, please HOLD on the morning of the test. Patients who wear a continuous glucose monitor MUST remove the device prior to  scanning      After the Test: Drink plenty of water . After receiving IV contrast, you may experience a mild flushed feeling. This is normal. On occasion, you may experience a mild rash up to 24 hours after the test. This is not dangerous. If this occurs, you can take Benadryl 25 mg, Zyrtec, Claritin , or Allegra and increase your fluid intake. (Patients taking Tikosyn should avoid Benadryl, and may take Zyrtec, Claritin , or Allegra) If you experience trouble breathing, this can be serious. If it is severe call 911 IMMEDIATELY. If it is mild, please call our office.  We will call to schedule your test 2-4 weeks out understanding that some insurance companies will need an authorization prior to the service being performed.   For more information and frequently asked questions, please visit our website : [email or web address]  For non-scheduling related questions, please contact the cardiac imaging nurse navigator should you have any questions/concerns: Cardiac Imaging Nurse Navigators Direct Office Dial: [phone]   For scheduling needs, including cancellations and rescheduling, please call [place], [phone].   Follow-Up: At [facility], you and your health needs are our priority.  As part of our continuing mission to provide you with exceptional heart care, our providers are all part of one team.  This team includes your primary Cardiologist (physician) and Advanced Practice Providers or APPs (Physician Assistants and Nurse Practitioners) who all work together to provide you with the care you need, when you need it.  Your next appointment:   1 month(s)  Provider:   You may see Antionette Kirks, MD or one of the following Advanced Practice Providers on  your designated Care Team:   Laneta Pintos, NP Gildardo Labrador, PA-C Varney Gentleman, PA-C Cadence Gennaro Khat, PA-C Ronald Cockayne, NP Morey Ar, NP

## 2024-05-18 NOTE — Progress Notes (Signed)
 Office Visit    Patient Name: [name] [name] Date of Encounter: 05/18/2024  Primary Care Provider:  Sowles, Krichna, MD Primary Cardiologist:  Antionette Kirks, MD  Chief Complaint    63 y.o. male with a history of nonobstructive CAD, stable angina, hypertension, hyperlipidemia, diabetes, obstructive sleep apnea on CPAP, remote tobacco abuse, obesity, and mitochondrial myopathy followed at [facility], who presents for follow-up related to precordial pain.  Past Medical History   Subjective   Past Medical History:  Diagnosis Date   Allergy    Angina, class III (HCC)    CAD (coronary artery disease)    a. 05/30/2015 cath: mLAD 50% (FFR 0.83)->Med Rx; b. 03/2016 Cath: LAD 29m-->Med Rx; c. 07/2020 Cath: LAD 32m; d. 07/2021 Cath: LM nl, LAD 10ost/m, 59m (nl FFR), D1/D2/D3 min irregs, LCX min irregs, OM1/2/3 nl, RCA min irregs, RPDA/RPAV/RPL1/RPL2 nl.   Chronic constipation    Degeneration of intervertebral disc of cervical region    Diastolic dysfunction    a.) TTE 07/22/2017: EF 60-65%, G1DD; b.) TTE 02/14/2021: EF 60-65%, G1DD; c.) TTE 01/19/2023: EF 55-60%, AoV sclerosis without stenosis, G1DD   Dyspnea    Erectile dysfunction    a.) on PDE5i (sildenafil )   GERD (gastroesophageal reflux disease)    Hernia 1991   Hyperlipidemia    Hypertension 2008   Mitochondrial myopathy    a.) followed by medical genetics specialist (Dr. Evorn Holder, MD) at [facility]   Nerve root pain    Neuropathy    Obesity, unspecified 2012   OSA on CPAP    Personal history of tobacco use, presenting hazards to health 2012   Polycythemia    Rectal bleeding 02/11/2017   Recurrent Right Inguinal Hernia Repair 02/09/2011, 04/07/2013   Seizure (HCC)    pt states neurologist thinking he was having seizures while sleeping-eeg done 10-2022 and was normal   Sleep difficulties    a.) on BZO (temazepam ) PRN   T2DM (type 2 diabetes mellitus) (HCC)    Umbilical hernia without mention of obstruction or gangrene 02/09/2011    Vitamin D  deficiency    Past Surgical History:  Procedure Laterality Date   BACK SURGERY  12/2008   [facility]   CARDIAC CATHETERIZATION N/A 05/30/2015   Procedure: Left Heart Cath;  Surgeon: Wenona Hamilton, MD;  Location: [facility] INVASIVE CV LAB;  Service: Cardiovascular;  Laterality: N/A;   CARDIAC CATHETERIZATION N/A 04/16/2016   Procedure: Left Heart Cath and Coronary Angiography;  Surgeon: Wenona Hamilton, MD;  Location: [facility] INVASIVE CV LAB;  Service: Cardiovascular;  Laterality: N/A;   COLONOSCOPY  2014   Dr. Lorel Roes   COLONOSCOPY WITH PROPOFOL  N/A 12/25/2016   Procedure: COLONOSCOPY WITH PROPOFOL ;  Surgeon: Luke Salaam, MD;  Location: [facility] ENDOSCOPY;  Service: Endoscopy;  Laterality: N/A;   COLONOSCOPY WITH PROPOFOL  N/A 01/29/2017   Procedure: COLONOSCOPY WITH PROPOFOL ;  Surgeon: Luke Salaam, MD;  Location: [facility] ENDOSCOPY;  Service: Endoscopy;  Laterality: N/A;   COLONOSCOPY WITH PROPOFOL  N/A 04/11/2020   Procedure: COLONOSCOPY WITH PROPOFOL ;  Surgeon: Luke Salaam, MD;  Location: [facility] ENDOSCOPY;  Service: Gastroenterology;  Laterality: N/A;   CORONARY PRESSURE/FFR STUDY N/A 07/30/2021   Procedure: INTRAVASCULAR PRESSURE WIRE/FFR STUDY;  Surgeon: Wenona Hamilton, MD;  Location: [facility] CV LAB;  Service: Cardiovascular;  Laterality: N/A;   ESOPHAGEAL MANOMETRY N/A 03/04/2022   Procedure: ESOPHAGEAL MANOMETRY (EM);  Surgeon: Sergio Dandy, MD;  Location: [facility] ENDOSCOPY;  Service: Gastroenterology;  Laterality: N/A;   ESOPHAGOGASTRODUODENOSCOPY N/A 05/09/2024  Procedure: EGD (ESOPHAGOGASTRODUODENOSCOPY);  Surgeon: Selena Daily, MD;  Location: [facility];  Service: Endoscopy;  Laterality: N/A;   ESOPHAGOGASTRODUODENOSCOPY (EGD) WITH PROPOFOL  N/A 01/26/2019   Procedure: ESOPHAGOGASTRODUODENOSCOPY (EGD) WITH PROPOFOL ;  Surgeon: Selena Daily, MD;  Location: [facility] ENDOSCOPY;  Service: Gastroenterology;  Laterality: N/A;   ESOPHAGOGASTRODUODENOSCOPY (EGD) WITH  PROPOFOL  N/A 09/10/2021   Procedure: ESOPHAGOGASTRODUODENOSCOPY (EGD) WITH PROPOFOL ;  Surgeon: Selena Daily, MD;  Location: [facility] ENDOSCOPY;  Service: Gastroenterology;  Laterality: N/A;   EVALUATION UNDER ANESTHESIA WITH HEMORRHOIDECTOMY N/A 02/24/2017   Procedure: EXAM UNDER ANESTHESIA WITH POSSIBLE EXCISION OF INTERNAL HEMORRHOIDS;  Surgeon: Emmalene Hare, MD;  Location: [facility] ORS;  Service: General;  Laterality: N/A;   FISSURECTOMY  02/24/2017   Procedure: FISSURECTOMY;  Surgeon: Emmalene Hare, MD;  Location: [facility] ORS;  Service: General;;   HAND SURGERY Left 2020   HERNIA REPAIR Right 1991   INGUINAL HERNIA REPAIR Right 2012   Dr Skeet Duke HERNIA REPAIR Right 2014   Dr Lorel Roes   LEFT HEART CATH AND CORONARY ANGIOGRAPHY Left 08/05/2020   Procedure: LEFT HEART CATH AND CORONARY ANGIOGRAPHY poss PCI;  Surgeon: Wenona Hamilton, MD;  Location: [facility] INVAS[facility] CV LAB;  Service: Cardiovascular;  Laterality: Left;   MUSCLE BIOPSY Left 12/31/2021   Procedure: LEFT VASTUS LATERALIS BIOPSY;  Surgeon: Jodeen Munch, MD;  Location: [facility] ORS;  Service: Neurosurgery;  Laterality: Left;   RIGHT/LEFT HEART CATH AND CORONARY ANGIOGRAPHY N/A 07/30/2021   Procedure: RIGHT/LEFT HEART CATH AND CORONARY ANGIOGRAPHY;  Surgeon: Wenona Hamilton, MD;  Location: [facility];  Service: Cardiovascular;  Laterality: N/A;   WRIST SURGERY Left    XI ROBOT ASSISTED DIAGNOSTIC LAPAROSCOPY Right 02/03/2023   Procedure: DIAGNOSTIC LAPAROSCOPY;  Surgeon: Emmalene Hare, MD;  Location: [facility] ORS;  Service: General;  Laterality: Right;    Allergies  Allergies  Allergen Reactions   Gabapentin  Other (See Comments)    Groggy-Mood Changes   Latex Rash   Penicillins Hives and Rash    Tolerated 1st generation cephalosporin (CEFAZOLIN ) on 12/31/2021 with no documented ADRs.  PCN reaction causing immediate rash, facial/tongue/throat swelling, SOB or lightheadedness with hypotension: Yes PCN reaction  causing severe rash involving mucus membranes or skin necrosis: No PCN reaction that required hospitalization No PCN reaction occurring within the last 10 years: No If all of the above answers are "NO", then may proceed with Cephalosporin use.    Cymbalta [Duloxetine Hcl]     Elevated BP and bad headache       History of Present Illness      63 y.o. y/o male with a history of nonobstructive CAD, stable angina, hypertension, hyperlipidemia, diabetes, obstructive sleep apnea on CPAP, remote tobacco abuse, obesity, and mitochondrial myopathy followed at [facility].  In the setting of stable angina, patient has undergone cardiac catheterization on multiple occasions, initially in June 2016 showing 50% LAD stenosis with normal FFR.  He had worsening angina April 2017 with repeat diagnostic catheterization showing a 30% mid LAD stenosis and otherwise nonobstructive disease.  In 2018, he had a syncopal spell of unclear etiology.  Echo showed normal LV function.  Event monitoring was insignificant and episodes were felt to be noncardiac in origin.  Most recent diagnostic catheterization was performed in August 2022 showing mild nonobstructive one-vessel CAD with 30% stenosis in the mid LAD.  EF was normal with mildly elevated LVEDP.  Physiologic testing within the Cath Lab and FFR of the LAD was 0.87 with a CFR 1.8 and  IMR of 22 indicating normal microvascular function.  He was subsequently diagnosed with mitochondrial myopathy with significant muscle weakness and atrophy as result and has been followed at [facility].  Most recent echo in January 2024 showed EF of 55 6% with mild LVH, grade 1 diastolic dysfunction, and aortic sclerosis.   Mr. Quast was last seen in cardiology clinic in January 2025 at which time he reported rare chest pain and dyspnea if he overexerts.  Over the past 6 months, he has noted a decline in activity tolerance and return of chest pain.  He notes he can only walk about 40 to 50 feet before  experiencing dyspnea on exertion and tiredness, which he attributes to his myocardial myopathy.  Unfortunately, he fell about 6 weeks ago and hurt his left knee and left shoulder.  He has been having pain in both areas.  He has also noted return of exertional left-sided chest discomfort and heaviness as well as occasional sharp left chest and shoulder pain that moves down his left arm and resolves within about a minute.  Symptoms can also occur at rest.  Some mornings, he wakes up with a sensation of a break in his left chest which resolves after about 15 minutes.  Chest and shoulder pain are not exacerbated by palpation or arm movement.  He is concerned about progression of LAD disease.  He denies palpitations, PND, orthopnea, dizziness, syncope, edema, or early satiety  Objective   Home Medications    Current Outpatient Medications  Medication Sig Dispense Refill   ACCU-CHEK GUIDE test strip USE TO TEST BLOOD SUGAR THREE TIMES DAILY AS NEEDED 100 strip 1   Accu-Chek Softclix Lancets lancets TEST TWICE DAILY 100 each 3   Alpha Lipoic Acid 200 MG CAPS Take 200 mg by mouth daily.     aspirin  EC 81 MG tablet Take by mouth.     Baclofen 5 MG TABS Take 5 mg by mouth 2 (two) times daily as needed.     blood glucose meter kit and supplies 1 each by Other route in the morning and at bedtime. Dispense based on patient and insurance preference. Use up to two  times daily. (FOR ICD-10 E10.9, E11.9). 1 each 0   carvedilol  (COREG ) 6.25 MG tablet TAKE 1 TABLET(6.25 MG) BY MOUTH TWICE DAILY 180 tablet 1   Coenzyme Q10 (COQ-10) 100 MG CAPS Take 100 mg by mouth daily.     ezetimibe  (ZETIA ) 10 MG tablet Take 1 tablet (10 mg total) by mouth daily. 90 tablet 1   fluticasone  (FLONASE ) 50 MCG/ACT nasal spray INSTILL 1 SPRAY INTO BOTH NOSTRILS AS NEEDED 48 g 0   fluticasone  furoate-vilanterol (BREO ELLIPTA ) 100-25 MCG/ACT AEPB INHALE 1 PUFF INTO THE LUNGS DAILY 60 each 6   hydrocortisone  2.5 % cream Apply to  hyperpigmentation on face QD on Monday, Wednesday, and Friday PRN. 28 g 3   insulin  glargine (LANTUS  SOLOSTAR) 100 UNIT/ML Solostar Pen Inject 5-10 Units into the skin daily. 15 mL PRN   Insulin  Pen Needle 32G X 6 MM MISC 1 each by Does not apply route daily at 12 noon. 100 each 0   ketoconazole  (NIZORAL ) 2 % shampoo Wash scalp/body/face QD on Tuesday, Thursday, and Saturday, let sit 5 minutes before rinsing off, avoid eyes. 120 mL 11   linaclotide  (LINZESS ) 145 MCG CAPS capsule Take 1 capsule (145 mcg total) by mouth daily before breakfast. 90 capsule 0   losartan -hydrochlorothiazide  (HYZAAR) 50-12.5 MG tablet Take 1 tablet by mouth daily.  90 tablet 1   meloxicam (MOBIC) 7.5 MG tablet Take 7.5 mg by mouth daily.     metoprolol tartrate (LOPRESSOR) 50 MG tablet TAKE 1 TABLET 2 HR PRIOR TO CARDIAC PROCEDURE 1 tablet 0   nitroGLYCERIN  (NITROSTAT ) 0.4 MG SL tablet Place 1 tablet (0.4 mg total) under the tongue every 5 (five) minutes as needed for chest pain.     nystatin (MYCOSTATIN) 100000 UNIT/ML suspension Take 5 mLs (500,000 Units total) by mouth 4 (four) times daily. 60 mL 0   omeprazole  (PRILOSEC) 40 MG capsule Take 1 capsule (40 mg total) by mouth 2 (two) times daily before a meal. 180 capsule 0   ranolazine  (RANEXA ) 1000 MG SR tablet Take 1 tablet (1,000 mg total) by mouth 2 (two) times daily. 180 tablet 0   Riboflavin (B-2) 100 MG TABS Take 100 mg by mouth daily.     rosuvastatin  (CRESTOR ) 10 MG tablet Take 1 tablet (10 mg total) by mouth daily. 90 tablet 1   sildenafil  (VIAGRA ) 100 MG tablet TAKE 1 TABLET(100 MG) BY MOUTH DAILY AS NEEDED FOR ERECTILE DYSFUNCTION 30 tablet 0   Sulfacetamide  Sodium 9.8 % SHAM Shampoo into the face and trunk QD on Monday, Wednesday, and Friday. Let sit a few minutes before washing off. 120 mL 11   temazepam  (RESTORIL ) 15 MG capsule Take 1 capsule (15 mg total) by mouth at bedtime. 90 capsule 0   tirzepatide (MOUNJARO) 7.5 MG/0.5ML Pen Inject 7.5 mg into the skin  once a week. 2 mL 0   Vitamin D , Ergocalciferol , (DRISDOL ) 1.25 MG (50000 UNIT) CAPS capsule Take 1 capsule (50,000 Units total) by mouth every 7 (seven) days. 12 capsule 1   No current facility-administered medications for this visit.     Physical Exam    VS:  BP (!) 110/58   Pulse 70   Ht 5\' 11"  (1.803 m)   Wt 244 lb 12.8 oz (111 kg)   SpO2 97%   BMI 34.14 kg/m  , BMI Body mass index is 34.14 kg/m.       GEN: Well nourished, well developed, in no acute distress. HEENT: normal. Neck: Supple, no JVD, carotid bruits, or masses. Cardiac: RRR, no murmurs, rubs, or gallops. No clubbing, cyanosis, edema.  Radials 2+/PT 2+ and equal bilaterally.  Respiratory:  Respirations regular and unlabored, clear to auscultation bilaterally. GI: Soft, nontender, nondistended, BS + x 4. MS: no deformity or atrophy. Skin: warm and dry, no rash. Neuro:  Strength and sensation are intact. Psych: Normal affect.  Accessory Clinical Findings    ECG personally reviewed by me today - EKG Interpretation Date/Time:  Thursday May 18 2024 08:33:56 EDT Ventricular Rate:  64 PR Interval:  170 QRS Duration:  94 QT Interval:  442 QTC Calculation: 455 R Axis:   48  Text Interpretation: Normal sinus rhythm Nonspecific ST and T wave abnormality Confirmed by Laneta Pintos [phone]) on 05/18/2024 8:41:20 AM  - no acute changes.  Lab Results  Component Value Date   WBC 8.1 05/16/2024   HGB 16.8 05/16/2024   HCT 49.6 05/16/2024   MCV 95.2 05/16/2024   PLT 224 05/16/2024   Lab Results  Component Value Date   CREATININE 1.09 05/16/2024   BUN 9 05/16/2024   NA 140 05/16/2024   K 3.4 (L) 05/16/2024   CL 101 05/16/2024   CO2 28 05/16/2024   Lab Results  Component Value Date   ALT 25 05/16/2024   AST 16 05/16/2024   ALKPHOS 61  02/28/2021   BILITOT 0.7 05/16/2024   Lab Results  Component Value Date   CHOL 131 05/16/2024   HDL 49 05/16/2024   LDLCALC 62 05/16/2024   TRIG 122 05/16/2024    CHOLHDL 2.7 05/16/2024    Lab Results  Component Value Date   HGBA1C 8.2 (A) 02/28/2024   Lab Results  Component Value Date   TSH 0.90 10/07/2021       Assessment & Plan    1.  Precordial pain/CAD: Patient with history of moderate, nonobstructive LAD disease with 4 catheterizations in the past, most recently in 2022, at which time physiologic testing showed normal microvascular function.  He has recently been having resting exertional chest heaviness and sharp pain that moves down his left arm.  He did recently injure his left shoulder following a fall for which he required physical therapy but he does not think this pain is related.  He is also had progression of dyspnea on exertion which he thinks may be attributable to mild cardiomyopathy.  In light of recurrence of symptoms and known CAD, we will pursue a coronary CT angiogram to rule out obstructive disease.  He remains on aspirin , beta-blocker, statin, Zetia , ARB, and ranolazine .  He just had lab work 2 days ago which showed stable renal function, blood counts, and mild hypokalemia.  2.  Primary hypertension: Blood pressure stable on beta-blocker, ARB, and diuretic.  3.  Hyperlipidemia: On statin and Zetia  with an LDL of 62 with normal LFTs on labs the other day.  4.  Type 2 diabetes mellitus: A1c was 8.2 in March.  He is followed closely by primary care and remains on insulin .  He is on Mounjaro , ARB, and statin as well.  5.  Obstructive sleep apnea: Compliant with CPAP.  6.  Mitochondrial myopathy: Followed at [facility].  7.  Disposition: Follow-up coronary CT angiogram.  Follow-up in clinic in approximately 1 month or sooner if necessary.  Laneta Pintos, NP 05/18/2024, 9:00 AM

## 2024-05-25 DIAGNOSIS — G713 Mitochondrial myopathy, not elsewhere classified: Secondary | ICD-10-CM | POA: Diagnosis not present

## 2024-05-29 ENCOUNTER — Emergency Department

## 2024-05-29 ENCOUNTER — Encounter: Payer: Self-pay | Admitting: Emergency Medicine

## 2024-05-29 ENCOUNTER — Emergency Department
Admission: EM | Admit: 2024-05-29 | Discharge: 2024-05-29 | Disposition: A | Discharge status: Home / Self Care | Attending: Emergency Medicine | Admitting: Emergency Medicine

## 2024-05-29 ENCOUNTER — Other Ambulatory Visit: Payer: Self-pay

## 2024-05-29 DIAGNOSIS — M25462 Effusion, left knee: Secondary | ICD-10-CM | POA: Diagnosis not present

## 2024-05-29 DIAGNOSIS — M25562 Pain in left knee: Secondary | ICD-10-CM | POA: Diagnosis not present

## 2024-05-29 DIAGNOSIS — Z043 Encounter for examination and observation following other accident: Secondary | ICD-10-CM | POA: Diagnosis not present

## 2024-05-29 DIAGNOSIS — M25552 Pain in left hip: Secondary | ICD-10-CM | POA: Diagnosis not present

## 2024-05-29 MED ORDER — OXYCODONE HCL 5 MG PO TABS
5.0000 mg | ORAL_TABLET | Freq: Once | ORAL | Status: AC
  Administered 2024-05-29: 5 mg via ORAL
  Filled 2024-05-29: qty 1

## 2024-05-29 MED ORDER — OXYCODONE-ACETAMINOPHEN 5-325 MG PO TABS: 1.0000 | ORAL_TABLET | ORAL | 0 refills | Status: DC | PRN

## 2024-05-29 NOTE — ED Notes (Signed)
 RN to bedside to introduce self to pt. Pt advised he injured his knee awhile back and aggravated the injury tonight. Pt states "I was told I have a tear in my meniscus". Pt caox4, in no acute distress.

## 2024-05-29 NOTE — ED Provider Notes (Signed)
 [facility] Provider Note   Event Date/Time   First MD Initiated Contact with Patient 05/29/24 0221     (approximate) History  Knee Pain  HPI [name] [name] is a 63 y.o. male with stated past medical history of meniscus tear in the right leg who presents complaining of of left knee pain after stepping wrong this evening.  Patient states that he felt a pop in this left knee and has been unable to bear weight since that time.  Patient Dors is 10/10 pain in the left anterior medial aspect of the knee.  Patient states that bending the left knee or trying to bear weight worsens this pain. ROS: Patient currently denies any vision changes, tinnitus, difficulty speaking, facial droop, sore throat, chest pain, shortness of breath, abdominal pain, nausea/vomiting/diarrhea, dysuria, or weakness/numbness/paresthesias in any extremity   Physical Exam  Triage Vital Signs: ED Triage Vitals  Encounter Vitals Group     BP 05/29/24 0217 (!) 158/103     Systolic BP Percentile --      Diastolic BP Percentile --      Pulse Rate 05/29/24 0217 82     Resp 05/29/24 0217 17     Temp 05/29/24 0217 97.8 F (36.6 C)     Temp Source 05/29/24 0217 Oral     SpO2 05/29/24 0217 97 %     Weight 05/29/24 0218 239 lb (108.4 kg)     Height 05/29/24 0218 5\' 11"  (1.803 m)     Head Circumference --      Peak Flow --      Pain Score 05/29/24 0218 10     Pain Loc --      Pain Education --      Exclude from Growth Chart --    Most recent vital signs: Vitals:   05/29/24 0217  BP: (!) 158/103  Pulse: 82  Resp: 17  Temp: 97.8 F (36.6 C)  SpO2: 97%   General: Awake, oriented x4. CV:  Good peripheral perfusion. Resp:  Normal effort. Abd:  No distention. Other:  Middle-aged obese African-American male resting comfortably in no acute distress.  No laxity on anterior and posterior drawer.  Patient unable to tolerate varus and valgus stress secondary to pain ED Results / Procedures /  Treatments  Labs (all labs ordered are listed, but only abnormal results are displayed) Labs Reviewed - No data to display RADIOLOGY ED MD interpretation: 2 view x-ray of the hip shows no evidence of acute abnormalities 4 view x-ray of the knee shows no evidence of acute abnormalities - All radiology independently interpreted and agree with radiology assessment Official radiology report(s): DG Hip Unilat W or Wo Pelvis 2-3 Views Left Result Date: 05/29/2024 EXAM: 3 VIEW(S) XRAY OF THE LEFT HIP 05/29/2024 03:36:00 AM COMPARISON: None available. CLINICAL HISTORY: Felt a pop and now has left knee and hip pain, unable to bear weight. FINDINGS: BONES AND JOINTS: No acute fracture or focal osseous lesion. The hip joint is maintained. No significant degenerative changes. Lumbar spine in position with hardware, incompletely visualized. SOFT TISSUES: Right inguinal hernia mesh repair. The soft tissues are otherwise unremarkable. IMPRESSION: 1. No acute fracture or dislocation in the left hip or visualized pelvis. Electronically signed by: Zadie Herter MD 05/29/2024 03:39 AM EDT RP Workstation: [id number]   DG Knee Complete 4 Views Left Result Date: 05/29/2024 EXAM: 4 VIEW(S) XRAY OF THE LEFT KNEE 05/29/2024 02:46:53 AM COMPARISON: None available. CLINICAL HISTORY: Fall; heard a pop. PER  ER NOTE; Pt in via POV, reports stepping off a curb the wrong way, hearing a pop to left knee, being unable to bear weight since. Patient appears uncomfortable. FINDINGS: BONES AND JOINTS: No acute fracture. No focal osseous lesion. No joint dislocation. Trace suprapatellar joint effusion. No significant degenerative changes. SOFT TISSUES: Vascular calcifications. IMPRESSION: 1. No acute fracture or dislocation. 2. Trace suprapatellar joint effusion. Electronically signed by: Zadie Herter MD 05/29/2024 02:52 AM EDT RP Workstation: [id number]   PROCEDURES: Critical Care performed: No Procedures MEDICATIONS ORDERED IN  ED: Medications  oxyCODONE  (Oxy IR/ROXICODONE ) immediate release tablet 5 mg (5 mg Oral Given 05/29/24 0235)  oxyCODONE  (Oxy IR/ROXICODONE ) immediate release tablet 5 mg (5 mg Oral Given 05/29/24 0415)   IMPRESSION / MDM / ASSESSMENT AND PLAN / ED COURSE  I reviewed the triage vital signs and the nursing notes.                             The patient is on the cardiac monitor to evaluate for evidence of arrhythmia and/or significant heart rate changes. Patient's presentation is most consistent with acute presentation with potential threat to life or bodily function. 63 year old male presents for left knee pain after he "stepped wrong" Given history, exam and workup I have low suspicion for fracture, dislocation, significant ligamentous injury, septic arthritis, gout flare, new autoimmune arthropathy, or gonococcal arthropathy.  Interventions: Knee and hip x-ray on the left showing no evidence of acute abnormalities Patient placed in knee immobilizer and patient states that he has multiple mobility devices at home including crutches, cane, and stand-up walker Disposition: Discharge home with strict return precautions and instructions for prompt primary care follow up in the next week.   FINAL CLINICAL IMPRESSION(S) / ED DIAGNOSES   Final diagnoses:  Acute pain of left knee   Rx / DC Orders   ED Discharge Orders          Ordered    oxyCODONE -acetaminophen  (PERCOCET) 5-325 MG tablet  Every 4 hours PRN        05/29/24 0336           Note:  This document was prepared using Dragon voice recognition software and may include unintentional dictation errors.   Sole Lengacher K, MD 05/29/24 [phone]

## 2024-05-29 NOTE — ED Notes (Signed)
 Pt given ice and blanket for knee.

## 2024-05-29 NOTE — ED Triage Notes (Signed)
 Pt in via POV, reports stepping off a curb the wrong way, hearing a pop to left knee, being  unable to bear weight since.  Patient appears uncomfortable.

## 2024-05-30 ENCOUNTER — Ambulatory Visit: Admitting: Family Medicine

## 2024-06-04 DIAGNOSIS — M1712 Unilateral primary osteoarthritis, left knee: Secondary | ICD-10-CM | POA: Insufficient documentation

## 2024-06-06 DIAGNOSIS — M5412 Radiculopathy, cervical region: Secondary | ICD-10-CM | POA: Diagnosis not present

## 2024-06-06 DIAGNOSIS — R531 Weakness: Secondary | ICD-10-CM | POA: Diagnosis not present

## 2024-06-07 ENCOUNTER — Encounter: Payer: Self-pay | Admitting: Family Medicine

## 2024-06-07 ENCOUNTER — Ambulatory Visit: Admitting: Family Medicine

## 2024-06-07 VITALS — BP 130/76 | HR 69 | Resp 16 | Ht 71.0 in | Wt 245.6 lb

## 2024-06-07 DIAGNOSIS — E8840 Mitochondrial metabolism disorder, unspecified: Secondary | ICD-10-CM

## 2024-06-07 DIAGNOSIS — F5101 Primary insomnia: Secondary | ICD-10-CM | POA: Diagnosis not present

## 2024-06-07 DIAGNOSIS — E1159 Type 2 diabetes mellitus with other circulatory complications: Secondary | ICD-10-CM

## 2024-06-07 DIAGNOSIS — R569 Unspecified convulsions: Secondary | ICD-10-CM

## 2024-06-07 DIAGNOSIS — G4733 Obstructive sleep apnea (adult) (pediatric): Secondary | ICD-10-CM

## 2024-06-07 DIAGNOSIS — I152 Hypertension secondary to endocrine disorders: Secondary | ICD-10-CM

## 2024-06-07 DIAGNOSIS — Z23 Encounter for immunization: Secondary | ICD-10-CM

## 2024-06-07 DIAGNOSIS — R809 Proteinuria, unspecified: Secondary | ICD-10-CM

## 2024-06-07 DIAGNOSIS — M1712 Unilateral primary osteoarthritis, left knee: Secondary | ICD-10-CM | POA: Diagnosis not present

## 2024-06-07 DIAGNOSIS — Z9181 History of falling: Secondary | ICD-10-CM | POA: Diagnosis not present

## 2024-06-07 DIAGNOSIS — Z7985 Long-term (current) use of injectable non-insulin antidiabetic drugs: Secondary | ICD-10-CM

## 2024-06-07 DIAGNOSIS — E1129 Type 2 diabetes mellitus with other diabetic kidney complication: Secondary | ICD-10-CM

## 2024-06-07 DIAGNOSIS — I209 Angina pectoris, unspecified: Secondary | ICD-10-CM

## 2024-06-07 LAB — POCT GLYCOSYLATED HEMOGLOBIN (HGB A1C): Hemoglobin A1C: 6 % — AB (ref 4.0–5.6)

## 2024-06-07 MED ORDER — TIRZEPATIDE 7.5 MG/0.5ML ~~LOC~~ SOAJ: 7.5000 mg | SUBCUTANEOUS | 0 refills | Status: DC

## 2024-06-07 NOTE — Telephone Encounter (Signed)
 Spoke to patient over the phone and told him he had an appt at 9:20
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
## 2024-06-12 ENCOUNTER — Encounter: Payer: Self-pay | Admitting: Family Medicine

## 2024-06-13 DIAGNOSIS — M5412 Radiculopathy, cervical region: Secondary | ICD-10-CM | POA: Diagnosis not present

## 2024-06-13 DIAGNOSIS — Z981 Arthrodesis status: Secondary | ICD-10-CM | POA: Diagnosis not present

## 2024-06-13 DIAGNOSIS — M47812 Spondylosis without myelopathy or radiculopathy, cervical region: Secondary | ICD-10-CM | POA: Diagnosis not present

## 2024-06-13 DIAGNOSIS — M5030 Other cervical disc degeneration, unspecified cervical region: Secondary | ICD-10-CM | POA: Diagnosis not present

## 2024-06-14 ENCOUNTER — Encounter (HOSPITAL_COMMUNITY): Payer: Self-pay

## 2024-06-15 ENCOUNTER — Ambulatory Visit
Admission: RE | Admit: 2024-06-15 | Discharge: 2024-06-15 | Disposition: A | Discharge status: Home / Self Care | Source: Ambulatory Visit | Attending: Nurse Practitioner | Admitting: Nurse Practitioner

## 2024-06-15 DIAGNOSIS — I25119 Atherosclerotic heart disease of native coronary artery with unspecified angina pectoris: Secondary | ICD-10-CM | POA: Insufficient documentation

## 2024-06-15 MED ORDER — IOHEXOL 350 MG/ML SOLN
100.0000 mL | Freq: Once | INTRAVENOUS | Status: AC | PRN
  Administered 2024-06-15: 100 mL via INTRAVENOUS

## 2024-06-15 MED ORDER — NITROGLYCERIN 0.4 MG SL SUBL
SUBLINGUAL_TABLET | SUBLINGUAL | Status: AC
  Filled 2024-06-15: qty 2

## 2024-06-15 MED ORDER — NITROGLYCERIN 0.4 MG SL SUBL
0.8000 mg | SUBLINGUAL_TABLET | Freq: Once | SUBLINGUAL | Status: AC
  Administered 2024-06-15: 0.8 mg via SUBLINGUAL

## 2024-06-15 NOTE — Progress Notes (Signed)
Patient tolerated CT well.  Vital signs stable encourage to drink water throughout day.Reasons explained and verbalized understanding.   

## 2024-06-16 ENCOUNTER — Ambulatory Visit: Payer: Self-pay | Admitting: Nurse Practitioner

## 2024-06-16 ENCOUNTER — Ambulatory Visit

## 2024-06-16 ENCOUNTER — Encounter: Payer: Self-pay | Admitting: Family Medicine

## 2024-06-16 DIAGNOSIS — E538 Deficiency of other specified B group vitamins: Secondary | ICD-10-CM | POA: Diagnosis not present

## 2024-06-16 MED ORDER — CYANOCOBALAMIN 1000 MCG/ML IJ SOLN
1000.0000 ug | Freq: Once | INTRAMUSCULAR | Status: AC
Start: 2024-06-16 — End: 2024-06-16
  Administered 2024-06-16: 1000 ug via INTRAMUSCULAR

## 2024-06-16 NOTE — Progress Notes (Signed)
 Patient is in office today for a nurse visit for B12 Injection. Patient Injection was given in the  Left deltoid. Patient tolerated injection well.

## 2024-06-21 ENCOUNTER — Ambulatory Visit: Admitting: Nurse Practitioner

## 2024-06-25 DIAGNOSIS — G713 Mitochondrial myopathy, not elsewhere classified: Secondary | ICD-10-CM | POA: Diagnosis not present

## 2024-06-26 ENCOUNTER — Encounter: Payer: Self-pay | Admitting: Family Medicine

## 2024-06-26 ENCOUNTER — Other Ambulatory Visit: Payer: Self-pay

## 2024-06-26 DIAGNOSIS — N529 Male erectile dysfunction, unspecified: Secondary | ICD-10-CM

## 2024-06-26 MED ORDER — SILDENAFIL CITRATE 100 MG PO TABS: 100.0000 mg | ORAL_TABLET | ORAL | 0 refills | Status: DC | PRN

## 2024-07-05 ENCOUNTER — Ambulatory Visit: Admitting: Nurse Practitioner

## 2024-07-07 ENCOUNTER — Other Ambulatory Visit: Payer: Self-pay | Admitting: Family Medicine

## 2024-07-07 ENCOUNTER — Encounter: Payer: Self-pay | Admitting: Family Medicine

## 2024-07-07 DIAGNOSIS — B370 Candidal stomatitis: Secondary | ICD-10-CM

## 2024-07-07 MED ORDER — NYSTATIN 100000 UNIT/ML MT SUSP: 5.0000 mL | Freq: Four times a day (QID) | OROMUCOSAL | 1 refills | Status: AC

## 2024-07-10 ENCOUNTER — Ambulatory Visit: Attending: Cardiology | Admitting: Cardiology

## 2024-07-10 ENCOUNTER — Encounter: Payer: Self-pay | Admitting: Cardiology

## 2024-07-10 ENCOUNTER — Ambulatory Visit: Admitting: Medical

## 2024-07-10 VITALS — BP 110/87 | HR 67 | Ht 71.0 in | Wt 241.6 lb

## 2024-07-10 DIAGNOSIS — Z794 Long term (current) use of insulin: Secondary | ICD-10-CM | POA: Insufficient documentation

## 2024-07-10 DIAGNOSIS — Z79899 Other long term (current) drug therapy: Secondary | ICD-10-CM | POA: Insufficient documentation

## 2024-07-10 DIAGNOSIS — G4733 Obstructive sleep apnea (adult) (pediatric): Secondary | ICD-10-CM | POA: Diagnosis not present

## 2024-07-10 DIAGNOSIS — I10 Essential (primary) hypertension: Secondary | ICD-10-CM | POA: Insufficient documentation

## 2024-07-10 DIAGNOSIS — I25118 Atherosclerotic heart disease of native coronary artery with other forms of angina pectoris: Secondary | ICD-10-CM | POA: Insufficient documentation

## 2024-07-10 DIAGNOSIS — E782 Mixed hyperlipidemia: Secondary | ICD-10-CM | POA: Diagnosis not present

## 2024-07-10 DIAGNOSIS — E8840 Mitochondrial metabolism disorder, unspecified: Secondary | ICD-10-CM | POA: Diagnosis not present

## 2024-07-10 DIAGNOSIS — E118 Type 2 diabetes mellitus with unspecified complications: Secondary | ICD-10-CM | POA: Insufficient documentation

## 2024-07-10 NOTE — Patient Instructions (Addendum)
 Medication Instructions:  Your physician recommends that you continue on your current medications as directed. Please refer to the Current Medication list given to you today.   *If you need a refill on your cardiac medications before your next appointment, please call your pharmacy*  Lab Work: No labs ordered today If you have labs (blood work) drawn today and your tests are completely normal, you will receive your results only by: MyChart Message (if you have MyChart) OR A paper copy in the mail If you have any lab test that is abnormal or we need to change your treatment, we will call you to review the results.  Testing/Procedures: No test ordered today   Follow-Up: At [facility], you and your health needs are our priority.  As part of our continuing mission to provide you with exceptional heart care, our providers are all part of one team.  This team includes your primary Cardiologist (physician) and Advanced Practice Providers or APPs (Physician Assistants and Nurse Practitioners) who all work together to provide you with the care you need, when you need it.  Your next appointment:   3 month(s)  Provider:   Vivienne Bruckner

## 2024-07-10 NOTE — Progress Notes (Signed)
 Cardiology Office Note   Date:  07/10/2024  ID:  [name], [name] 01-May-1961, MRN [id number] PCP: Glenard Mire, MD  [facility] Providers Cardiologist:  Deatrice Cage, MD     History of Present Illness [name] [name] is a 63 y.o. male with past medical history of nonobstructive coronary artery disease, stable angina, hypertension, hyperlipidemia, diabetes, obstructive sleep apnea on CPAP, remote tobacco abuse, obesity, mitochondrial myopathy, followed by [facility], who presents today for follow-up.   Patient has undergone cardiac catheterization on multiple occasions, initially in June 17 1619% LAD stenosis with normal FFR.  He had worsening angina in April 2017 with repeat diagnostic catheterization showing 30% mid LAD stenosis and otherwise nonobstructive disease.  In 2018, he had a syncopal spell of unclear etiology.  Echocardiogram showed normal LV function.  Event monitoring was insufficient and episodes were felt to be noncardiac in origin.  Diagnostic catheterization performed in August 2022 showed mild nonobstructive one-vessel coronary artery disease with 30% stenosis of mid LAD.  EF was normal with mildly elevated LVEDP.  FFR of the LAD was 0.87 with a CFR of 1.8 and IMR 22 indicating normal microvascular function.  He was subsequently diagnosed with mitochondrial myopathy with significant muscle weakness and atrophy as a result and has been followed at [facility].  Echocardiogram in January 2024 showed EF 55 to 60% with mild LVH, G1 diastolic dysfunction and aortic sclerosis without stenosis.  He was evaluated in clinic in January 2025 reported for chest pain with dyspnea if he overexerts.    He was last seen in clinic 05/18/2024 stating for the last 6 months he noticed decline in activity tolerance or return of chest pain.  He noted only walk 4050 feet before experience dyspnea on exertion and tiredness which he attributes to his mild cardiomyopathy.  Unfortunately he fell about 6  weeks prior and hurt his left knee and left shoulder.  He has been having pain in both ears.  He also noted the return of a exertional left-sided chest discomfort and heaviness as well as some occasional sharp left chest and shoulder pain that moves down his left arm that resolves within about a minute.  He was scheduled for coronary CTA to rule out obstructive disease.  He returns to clinic today stating overall he has been doing well.  He has not had any recurrent chest discomfort, shortness of breath, peripheral edema, or palpitations.  He states that he has recurrent symptoms every several months when his mitochondrial myopathy flares up.  He has been compliant with his current medication regimen without undue side effects.  States that he continues to try to remain as active as possible and is walking with a cane today.  He has 15 grandchildren and 4 great-grandchildren they keep him busy.  He had previously been advised to take  it easy but he states that he is unable to do so.  Continues to have recurrent episodes of falling due to his left knee giving out on him which he previously been told he needs to have surgery on which he states he is unable to do at this time. Has discussed with his surgeon about potentially having surgery completed later in the year.  He denies any recent hospitalizations or visits to the emergency department.  ROS: 10 point review of systems has been reviewed and considered negative except ones are listed in the HPI  Studies Reviewed EKG Interpretation Date/Time:  Monday July 10 2024 10:19:53 EDT Ventricular Rate:  67  PR Interval:  176 QRS Duration:  94 QT Interval:  428 QTC Calculation: 452 R Axis:   39  Text Interpretation: Normal sinus rhythm Normal ECG When compared with ECG of 18-May-2024 08:33, No significant change since last tracing Confirmed by Gerard Frederick ([id number]) on 07/10/2024 10:23:35 AM    cCTA 06/15/2024 IMPRESSION: 1. Mild CAD, 25-49% stenosis,  CADRADS 2.   2. Total plaque volume 808 mm3 which is 76th percentile for age- and sex-matched controls (calcified plaque 246 mm3; non-calcified plaque 562 mm3). TPV is extensive.   3. Coronary calcium  score is 1111, which places the patient in the 99th percentile for age and sex matched control.   4. Normal coronary origins with right dominance.   5. Mild dilation of main pulmonary artery, 30 mm, may indicate elevated pulmonary pressures.   RECOMMENDATIONS: CAD-RADS 2. Mild non-obstructive CAD (25-49%). Consider non-atherosclerotic causes of chest pain. Consider preventive therapy and risk factor modification.  2D echo 01/19/2023 1. Left ventricular ejection fraction, by estimation, is 55 to 60%. The  left ventricle has normal function. The left ventricle has no regional  wall motion abnormalities. There is mild left ventricular hypertrophy.  Left ventricular diastolic parameters  are consistent with Grade I diastolic dysfunction (impaired relaxation).  The average left ventricular global longitudinal strain is -16.6 %.   2. Right ventricular systolic function is normal. The right ventricular  size is normal. Tricuspid regurgitation signal is inadequate for assessing  PA pressure.   3. The mitral valve is normal in structure. No evidence of mitral valve  regurgitation. No evidence of mitral stenosis.   4. The aortic valve is normal in structure. Aortic valve regurgitation is  not visualized. Aortic valve sclerosis is present, with no evidence of  aortic valve stenosis.   5. The inferior vena cava is normal in size with greater than 50%  respiratory variability, suggesting right atrial pressure of 3 mmHg.    Risk Assessment/Calculations         Physical Exam VS:  BP 110/87 (BP Location: Left Arm, Patient Position: Sitting, Cuff Size: Normal)   Pulse 67   Ht 5' 11 (1.803 m)   Wt 241 lb 9.6 oz (109.6 kg)   SpO2 96%   BMI 33.70 kg/m        Wt Readings from Last 3  Encounters:  07/10/24 241 lb 9.6 oz (109.6 kg)  06/07/24 245 lb 9.6 oz (111.4 kg)  05/29/24 239 lb (108.4 kg)    GEN: Well nourished, well developed in no acute distress NECK: No JVD; No carotid bruits CARDIAC: RRR, no murmurs, rubs, gallops RESPIRATORY:  Clear to auscultation without rales, wheezing or rhonchi  ABDOMEN: Soft, non-tender, non-distended EXTREMITIES:  No edema; No deformity   ASSESSMENT AND PLAN Precordial pain/coronary artery disease with history of moderate nonobstructive LAD disease before catheterizations in the past most recent catheterization in 2022.  EKG today reveals sinus rhythm with a rate of 67 with no acute changes noted.  He recently underwent coronary CTA which revealed no extracardiac findings on the chest CT.  Mild CAD (25 to 49%) stenosis.  Total plaque volume 808 which is a 76 percentile for age and sex matched controls, coronary calcium  score was 1111 which places the patient on 90th percentile for age and sex matched control, mild dilatation of the pulmonary artery and 30 mm may indicate elevated pulmonary pressures overall mild nonobstructive disease recommendation is to consider nonatherosclerotic causes of chest pain and consider preventative therapy and risk factor modification.  No further episodes of chest discomfort.  He is continued on aspirin  81 mg daily, co-Q10 at 1000 mg daily, ezetimibe  10 mg daily, rosuvastatin  10 mg daily.  Reached out to pharmacy to determine if PCSK9 inhibitor therapy would be more beneficial than continuing to increase his statin therapy with helping to prevent progression of plaque.  Primary hypertension with blood pressure today 110/87.  Blood pressures remained stable.  He is continued on losartan  HCTZ 50/12.5 mg daily and carvedilol  6.25 mg twice daily.  He has been encouraged to continue to monitor his pressures 1 to 2 hours postmedication administration as well.  Mixed hyperlipidemia with an LDL of 62 which is below the goal  of 70 but ideally goal would be 55.  He also had normal LFTs on recent labs.  He is continued on ezetimibe  and rosuvastatin .  Will await feedback from pharmacy to determine if a PCSK9 inhibitor therapy would be more beneficial with his elevated TPV on coronary CTA.  Type 2 diabetes A1c of 8.2 in March.  He is continued closely by primary care has been maintained on insulin  and Mounjaro .  Obstructive sleep apnea compliant with CPAP.  Mitochondrial myopathy followed at [facility]    Dispo: Patient to return to clinic to see MD/APP in 3 months or sooner if needed for further evaluation.  Signed, Talen Poser, NP

## 2024-07-18 ENCOUNTER — Ambulatory Visit (INDEPENDENT_AMBULATORY_CARE_PROVIDER_SITE_OTHER)

## 2024-07-18 DIAGNOSIS — E538 Deficiency of other specified B group vitamins: Secondary | ICD-10-CM | POA: Diagnosis not present

## 2024-07-18 MED ORDER — CYANOCOBALAMIN 1000 MCG/ML IJ SOLN
1000.0000 ug | Freq: Once | INTRAMUSCULAR | Status: AC
  Administered 2024-07-18: 1000 ug via INTRAMUSCULAR

## 2024-07-20 ENCOUNTER — Other Ambulatory Visit: Payer: Self-pay | Admitting: Cardiovascular Disease

## 2024-07-25 DIAGNOSIS — G713 Mitochondrial myopathy, not elsewhere classified: Secondary | ICD-10-CM | POA: Diagnosis not present

## 2024-08-04 ENCOUNTER — Other Ambulatory Visit: Payer: Self-pay | Admitting: Family Medicine

## 2024-08-04 DIAGNOSIS — G4733 Obstructive sleep apnea (adult) (pediatric): Secondary | ICD-10-CM | POA: Diagnosis not present

## 2024-08-04 DIAGNOSIS — G713 Mitochondrial myopathy, not elsewhere classified: Secondary | ICD-10-CM | POA: Diagnosis not present

## 2024-08-04 DIAGNOSIS — J302 Other seasonal allergic rhinitis: Secondary | ICD-10-CM

## 2024-08-11 DIAGNOSIS — G713 Mitochondrial myopathy, not elsewhere classified: Secondary | ICD-10-CM | POA: Diagnosis not present

## 2024-08-11 DIAGNOSIS — E559 Vitamin D deficiency, unspecified: Secondary | ICD-10-CM | POA: Diagnosis not present

## 2024-08-11 DIAGNOSIS — E7140 Disorder of carnitine metabolism, unspecified: Secondary | ICD-10-CM | POA: Diagnosis not present

## 2024-08-12 ENCOUNTER — Encounter: Payer: Self-pay | Admitting: Gastroenterology

## 2024-08-14 DIAGNOSIS — G713 Mitochondrial myopathy, not elsewhere classified: Secondary | ICD-10-CM | POA: Diagnosis not present

## 2024-08-14 DIAGNOSIS — E559 Vitamin D deficiency, unspecified: Secondary | ICD-10-CM | POA: Diagnosis not present

## 2024-08-14 DIAGNOSIS — E7140 Disorder of carnitine metabolism, unspecified: Secondary | ICD-10-CM | POA: Diagnosis not present

## 2024-08-15 ENCOUNTER — Encounter: Payer: Self-pay | Admitting: Family Medicine

## 2024-08-15 ENCOUNTER — Other Ambulatory Visit: Payer: Self-pay | Admitting: Family Medicine

## 2024-08-15 ENCOUNTER — Other Ambulatory Visit: Payer: Self-pay

## 2024-08-15 DIAGNOSIS — I10 Essential (primary) hypertension: Secondary | ICD-10-CM

## 2024-08-15 MED ORDER — CARVEDILOL 6.25 MG PO TABS: 6.2500 mg | ORAL_TABLET | Freq: Two times a day (BID) | ORAL | 0 refills | Status: DC

## 2024-08-15 NOTE — Telephone Encounter (Signed)
 Patient requesting 90 day supply but will be seen next month

## 2024-08-17 ENCOUNTER — Ambulatory Visit (INDEPENDENT_AMBULATORY_CARE_PROVIDER_SITE_OTHER)

## 2024-08-17 DIAGNOSIS — G713 Mitochondrial myopathy, not elsewhere classified: Secondary | ICD-10-CM | POA: Diagnosis not present

## 2024-08-17 DIAGNOSIS — E538 Deficiency of other specified B group vitamins: Secondary | ICD-10-CM | POA: Diagnosis not present

## 2024-08-17 MED ORDER — CYANOCOBALAMIN 1000 MCG/ML IJ SOLN
1000.0000 ug | Freq: Once | INTRAMUSCULAR | Status: AC
  Administered 2024-08-17: 1000 ug via INTRAMUSCULAR

## 2024-08-18 ENCOUNTER — Other Ambulatory Visit: Payer: Self-pay | Admitting: Family Medicine

## 2024-08-18 ENCOUNTER — Ambulatory Visit

## 2024-08-18 ENCOUNTER — Encounter: Payer: Self-pay | Admitting: Family Medicine

## 2024-08-18 DIAGNOSIS — I10 Essential (primary) hypertension: Secondary | ICD-10-CM

## 2024-08-18 DIAGNOSIS — M47812 Spondylosis without myelopathy or radiculopathy, cervical region: Secondary | ICD-10-CM | POA: Diagnosis not present

## 2024-08-18 DIAGNOSIS — M5030 Other cervical disc degeneration, unspecified cervical region: Secondary | ICD-10-CM | POA: Diagnosis not present

## 2024-08-18 DIAGNOSIS — M5412 Radiculopathy, cervical region: Secondary | ICD-10-CM | POA: Diagnosis not present

## 2024-08-25 DIAGNOSIS — G713 Mitochondrial myopathy, not elsewhere classified: Secondary | ICD-10-CM | POA: Diagnosis not present

## 2024-08-29 DIAGNOSIS — Z9181 History of falling: Secondary | ICD-10-CM | POA: Diagnosis not present

## 2024-08-29 DIAGNOSIS — Z7984 Long term (current) use of oral hypoglycemic drugs: Secondary | ICD-10-CM | POA: Diagnosis not present

## 2024-08-29 DIAGNOSIS — Z981 Arthrodesis status: Secondary | ICD-10-CM | POA: Diagnosis not present

## 2024-08-29 DIAGNOSIS — G713 Mitochondrial myopathy, not elsewhere classified: Secondary | ICD-10-CM | POA: Diagnosis not present

## 2024-08-29 DIAGNOSIS — Z7982 Long term (current) use of aspirin: Secondary | ICD-10-CM | POA: Diagnosis not present

## 2024-08-29 DIAGNOSIS — E119 Type 2 diabetes mellitus without complications: Secondary | ICD-10-CM | POA: Diagnosis not present

## 2024-08-29 DIAGNOSIS — S83512D Sprain of anterior cruciate ligament of left knee, subsequent encounter: Secondary | ICD-10-CM | POA: Diagnosis not present

## 2024-08-29 DIAGNOSIS — G3184 Mild cognitive impairment, so stated: Secondary | ICD-10-CM | POA: Diagnosis not present

## 2024-08-29 DIAGNOSIS — E7140 Disorder of carnitine metabolism, unspecified: Secondary | ICD-10-CM | POA: Diagnosis not present

## 2024-08-29 DIAGNOSIS — I25118 Atherosclerotic heart disease of native coronary artery with other forms of angina pectoris: Secondary | ICD-10-CM | POA: Diagnosis not present

## 2024-08-29 DIAGNOSIS — Z9989 Dependence on other enabling machines and devices: Secondary | ICD-10-CM | POA: Diagnosis not present

## 2024-08-29 DIAGNOSIS — I10 Essential (primary) hypertension: Secondary | ICD-10-CM | POA: Diagnosis not present

## 2024-08-29 DIAGNOSIS — M5412 Radiculopathy, cervical region: Secondary | ICD-10-CM | POA: Diagnosis not present

## 2024-08-29 DIAGNOSIS — E538 Deficiency of other specified B group vitamins: Secondary | ICD-10-CM | POA: Diagnosis not present

## 2024-08-29 DIAGNOSIS — M5030 Other cervical disc degeneration, unspecified cervical region: Secondary | ICD-10-CM | POA: Diagnosis not present

## 2024-08-29 DIAGNOSIS — M47812 Spondylosis without myelopathy or radiculopathy, cervical region: Secondary | ICD-10-CM | POA: Diagnosis not present

## 2024-08-29 DIAGNOSIS — E559 Vitamin D deficiency, unspecified: Secondary | ICD-10-CM | POA: Diagnosis not present

## 2024-08-29 DIAGNOSIS — E782 Mixed hyperlipidemia: Secondary | ICD-10-CM | POA: Diagnosis not present

## 2024-08-29 DIAGNOSIS — Z7985 Long-term (current) use of injectable non-insulin antidiabetic drugs: Secondary | ICD-10-CM | POA: Diagnosis not present

## 2024-08-29 DIAGNOSIS — G4733 Obstructive sleep apnea (adult) (pediatric): Secondary | ICD-10-CM | POA: Diagnosis not present

## 2024-08-29 DIAGNOSIS — Z794 Long term (current) use of insulin: Secondary | ICD-10-CM | POA: Diagnosis not present

## 2024-08-30 ENCOUNTER — Other Ambulatory Visit: Payer: Self-pay | Admitting: Family Medicine

## 2024-08-31 ENCOUNTER — Other Ambulatory Visit: Payer: Self-pay | Admitting: Family Medicine

## 2024-08-31 DIAGNOSIS — Z981 Arthrodesis status: Secondary | ICD-10-CM | POA: Diagnosis not present

## 2024-08-31 DIAGNOSIS — E538 Deficiency of other specified B group vitamins: Secondary | ICD-10-CM | POA: Diagnosis not present

## 2024-08-31 DIAGNOSIS — Z7985 Long-term (current) use of injectable non-insulin antidiabetic drugs: Secondary | ICD-10-CM | POA: Diagnosis not present

## 2024-08-31 DIAGNOSIS — I25118 Atherosclerotic heart disease of native coronary artery with other forms of angina pectoris: Secondary | ICD-10-CM | POA: Diagnosis not present

## 2024-08-31 DIAGNOSIS — M5412 Radiculopathy, cervical region: Secondary | ICD-10-CM | POA: Diagnosis not present

## 2024-08-31 DIAGNOSIS — E119 Type 2 diabetes mellitus without complications: Secondary | ICD-10-CM | POA: Diagnosis not present

## 2024-08-31 DIAGNOSIS — Z7984 Long term (current) use of oral hypoglycemic drugs: Secondary | ICD-10-CM | POA: Diagnosis not present

## 2024-08-31 DIAGNOSIS — M5030 Other cervical disc degeneration, unspecified cervical region: Secondary | ICD-10-CM | POA: Diagnosis not present

## 2024-08-31 DIAGNOSIS — S83512D Sprain of anterior cruciate ligament of left knee, subsequent encounter: Secondary | ICD-10-CM | POA: Diagnosis not present

## 2024-08-31 DIAGNOSIS — I10 Essential (primary) hypertension: Secondary | ICD-10-CM | POA: Diagnosis not present

## 2024-08-31 DIAGNOSIS — E7140 Disorder of carnitine metabolism, unspecified: Secondary | ICD-10-CM | POA: Diagnosis not present

## 2024-08-31 DIAGNOSIS — Z7982 Long term (current) use of aspirin: Secondary | ICD-10-CM | POA: Diagnosis not present

## 2024-08-31 DIAGNOSIS — E559 Vitamin D deficiency, unspecified: Secondary | ICD-10-CM | POA: Diagnosis not present

## 2024-08-31 DIAGNOSIS — N529 Male erectile dysfunction, unspecified: Secondary | ICD-10-CM

## 2024-08-31 DIAGNOSIS — G4733 Obstructive sleep apnea (adult) (pediatric): Secondary | ICD-10-CM | POA: Diagnosis not present

## 2024-08-31 DIAGNOSIS — Z9989 Dependence on other enabling machines and devices: Secondary | ICD-10-CM | POA: Diagnosis not present

## 2024-08-31 DIAGNOSIS — E782 Mixed hyperlipidemia: Secondary | ICD-10-CM | POA: Diagnosis not present

## 2024-08-31 DIAGNOSIS — M47812 Spondylosis without myelopathy or radiculopathy, cervical region: Secondary | ICD-10-CM | POA: Diagnosis not present

## 2024-08-31 DIAGNOSIS — G3184 Mild cognitive impairment, so stated: Secondary | ICD-10-CM | POA: Diagnosis not present

## 2024-08-31 DIAGNOSIS — Z794 Long term (current) use of insulin: Secondary | ICD-10-CM | POA: Diagnosis not present

## 2024-08-31 DIAGNOSIS — G713 Mitochondrial myopathy, not elsewhere classified: Secondary | ICD-10-CM | POA: Diagnosis not present

## 2024-08-31 DIAGNOSIS — Z9181 History of falling: Secondary | ICD-10-CM | POA: Diagnosis not present

## 2024-09-04 ENCOUNTER — Other Ambulatory Visit: Payer: Self-pay | Admitting: Family Medicine

## 2024-09-04 DIAGNOSIS — E1129 Type 2 diabetes mellitus with other diabetic kidney complication: Secondary | ICD-10-CM

## 2024-09-04 DIAGNOSIS — M47812 Spondylosis without myelopathy or radiculopathy, cervical region: Secondary | ICD-10-CM | POA: Diagnosis not present

## 2024-09-04 DIAGNOSIS — I10 Essential (primary) hypertension: Secondary | ICD-10-CM | POA: Diagnosis not present

## 2024-09-04 DIAGNOSIS — E538 Deficiency of other specified B group vitamins: Secondary | ICD-10-CM | POA: Diagnosis not present

## 2024-09-04 DIAGNOSIS — G3184 Mild cognitive impairment, so stated: Secondary | ICD-10-CM | POA: Diagnosis not present

## 2024-09-04 DIAGNOSIS — E559 Vitamin D deficiency, unspecified: Secondary | ICD-10-CM | POA: Diagnosis not present

## 2024-09-04 DIAGNOSIS — E119 Type 2 diabetes mellitus without complications: Secondary | ICD-10-CM | POA: Diagnosis not present

## 2024-09-04 DIAGNOSIS — I25118 Atherosclerotic heart disease of native coronary artery with other forms of angina pectoris: Secondary | ICD-10-CM | POA: Diagnosis not present

## 2024-09-04 DIAGNOSIS — M5030 Other cervical disc degeneration, unspecified cervical region: Secondary | ICD-10-CM | POA: Diagnosis not present

## 2024-09-04 DIAGNOSIS — E7140 Disorder of carnitine metabolism, unspecified: Secondary | ICD-10-CM | POA: Diagnosis not present

## 2024-09-04 DIAGNOSIS — G713 Mitochondrial myopathy, not elsewhere classified: Secondary | ICD-10-CM | POA: Diagnosis not present

## 2024-09-04 DIAGNOSIS — M5412 Radiculopathy, cervical region: Secondary | ICD-10-CM | POA: Diagnosis not present

## 2024-09-04 DIAGNOSIS — S83512D Sprain of anterior cruciate ligament of left knee, subsequent encounter: Secondary | ICD-10-CM | POA: Diagnosis not present

## 2024-09-05 ENCOUNTER — Other Ambulatory Visit: Payer: Self-pay | Admitting: Family Medicine

## 2024-09-05 ENCOUNTER — Encounter: Payer: Self-pay | Admitting: Family Medicine

## 2024-09-05 DIAGNOSIS — I25118 Atherosclerotic heart disease of native coronary artery with other forms of angina pectoris: Secondary | ICD-10-CM | POA: Diagnosis not present

## 2024-09-05 DIAGNOSIS — G3184 Mild cognitive impairment, so stated: Secondary | ICD-10-CM | POA: Diagnosis not present

## 2024-09-05 DIAGNOSIS — Z7982 Long term (current) use of aspirin: Secondary | ICD-10-CM | POA: Diagnosis not present

## 2024-09-05 DIAGNOSIS — E1159 Type 2 diabetes mellitus with other circulatory complications: Secondary | ICD-10-CM

## 2024-09-05 DIAGNOSIS — M47812 Spondylosis without myelopathy or radiculopathy, cervical region: Secondary | ICD-10-CM | POA: Diagnosis not present

## 2024-09-05 DIAGNOSIS — E559 Vitamin D deficiency, unspecified: Secondary | ICD-10-CM | POA: Diagnosis not present

## 2024-09-05 DIAGNOSIS — S83512D Sprain of anterior cruciate ligament of left knee, subsequent encounter: Secondary | ICD-10-CM | POA: Diagnosis not present

## 2024-09-05 DIAGNOSIS — Z794 Long term (current) use of insulin: Secondary | ICD-10-CM | POA: Diagnosis not present

## 2024-09-05 DIAGNOSIS — E7140 Disorder of carnitine metabolism, unspecified: Secondary | ICD-10-CM | POA: Diagnosis not present

## 2024-09-05 DIAGNOSIS — I10 Essential (primary) hypertension: Secondary | ICD-10-CM | POA: Diagnosis not present

## 2024-09-05 DIAGNOSIS — Z7984 Long term (current) use of oral hypoglycemic drugs: Secondary | ICD-10-CM | POA: Diagnosis not present

## 2024-09-05 DIAGNOSIS — Z981 Arthrodesis status: Secondary | ICD-10-CM | POA: Diagnosis not present

## 2024-09-05 DIAGNOSIS — Z9181 History of falling: Secondary | ICD-10-CM | POA: Diagnosis not present

## 2024-09-05 DIAGNOSIS — Z7985 Long-term (current) use of injectable non-insulin antidiabetic drugs: Secondary | ICD-10-CM | POA: Diagnosis not present

## 2024-09-05 DIAGNOSIS — M5030 Other cervical disc degeneration, unspecified cervical region: Secondary | ICD-10-CM | POA: Diagnosis not present

## 2024-09-05 DIAGNOSIS — Z9989 Dependence on other enabling machines and devices: Secondary | ICD-10-CM | POA: Diagnosis not present

## 2024-09-05 DIAGNOSIS — E538 Deficiency of other specified B group vitamins: Secondary | ICD-10-CM | POA: Diagnosis not present

## 2024-09-05 DIAGNOSIS — M5412 Radiculopathy, cervical region: Secondary | ICD-10-CM | POA: Diagnosis not present

## 2024-09-05 DIAGNOSIS — E119 Type 2 diabetes mellitus without complications: Secondary | ICD-10-CM | POA: Diagnosis not present

## 2024-09-05 DIAGNOSIS — G713 Mitochondrial myopathy, not elsewhere classified: Secondary | ICD-10-CM | POA: Diagnosis not present

## 2024-09-05 DIAGNOSIS — G4733 Obstructive sleep apnea (adult) (pediatric): Secondary | ICD-10-CM | POA: Diagnosis not present

## 2024-09-05 DIAGNOSIS — E782 Mixed hyperlipidemia: Secondary | ICD-10-CM | POA: Diagnosis not present

## 2024-09-05 MED ORDER — TIRZEPATIDE 7.5 MG/0.5ML ~~LOC~~ SOAJ: 7.5000 mg | SUBCUTANEOUS | 0 refills | Status: DC

## 2024-09-07 DIAGNOSIS — G3184 Mild cognitive impairment, so stated: Secondary | ICD-10-CM | POA: Diagnosis not present

## 2024-09-07 DIAGNOSIS — M47812 Spondylosis without myelopathy or radiculopathy, cervical region: Secondary | ICD-10-CM | POA: Diagnosis not present

## 2024-09-07 DIAGNOSIS — E559 Vitamin D deficiency, unspecified: Secondary | ICD-10-CM | POA: Diagnosis not present

## 2024-09-07 DIAGNOSIS — I25118 Atherosclerotic heart disease of native coronary artery with other forms of angina pectoris: Secondary | ICD-10-CM | POA: Diagnosis not present

## 2024-09-07 DIAGNOSIS — Z7982 Long term (current) use of aspirin: Secondary | ICD-10-CM | POA: Diagnosis not present

## 2024-09-07 DIAGNOSIS — I10 Essential (primary) hypertension: Secondary | ICD-10-CM | POA: Diagnosis not present

## 2024-09-07 DIAGNOSIS — Z7984 Long term (current) use of oral hypoglycemic drugs: Secondary | ICD-10-CM | POA: Diagnosis not present

## 2024-09-07 DIAGNOSIS — M5412 Radiculopathy, cervical region: Secondary | ICD-10-CM | POA: Diagnosis not present

## 2024-09-07 DIAGNOSIS — Z7985 Long-term (current) use of injectable non-insulin antidiabetic drugs: Secondary | ICD-10-CM | POA: Diagnosis not present

## 2024-09-07 DIAGNOSIS — G713 Mitochondrial myopathy, not elsewhere classified: Secondary | ICD-10-CM | POA: Diagnosis not present

## 2024-09-07 DIAGNOSIS — S83512D Sprain of anterior cruciate ligament of left knee, subsequent encounter: Secondary | ICD-10-CM | POA: Diagnosis not present

## 2024-09-07 DIAGNOSIS — M5030 Other cervical disc degeneration, unspecified cervical region: Secondary | ICD-10-CM | POA: Diagnosis not present

## 2024-09-07 DIAGNOSIS — E782 Mixed hyperlipidemia: Secondary | ICD-10-CM | POA: Diagnosis not present

## 2024-09-07 DIAGNOSIS — Z981 Arthrodesis status: Secondary | ICD-10-CM | POA: Diagnosis not present

## 2024-09-07 DIAGNOSIS — E538 Deficiency of other specified B group vitamins: Secondary | ICD-10-CM | POA: Diagnosis not present

## 2024-09-07 DIAGNOSIS — Z794 Long term (current) use of insulin: Secondary | ICD-10-CM | POA: Diagnosis not present

## 2024-09-07 DIAGNOSIS — E7140 Disorder of carnitine metabolism, unspecified: Secondary | ICD-10-CM | POA: Diagnosis not present

## 2024-09-07 DIAGNOSIS — Z9989 Dependence on other enabling machines and devices: Secondary | ICD-10-CM | POA: Diagnosis not present

## 2024-09-07 DIAGNOSIS — Z9181 History of falling: Secondary | ICD-10-CM | POA: Diagnosis not present

## 2024-09-07 DIAGNOSIS — G4733 Obstructive sleep apnea (adult) (pediatric): Secondary | ICD-10-CM | POA: Diagnosis not present

## 2024-09-07 DIAGNOSIS — E119 Type 2 diabetes mellitus without complications: Secondary | ICD-10-CM | POA: Diagnosis not present

## 2024-09-10 ENCOUNTER — Other Ambulatory Visit: Payer: Self-pay | Admitting: Family Medicine

## 2024-09-11 ENCOUNTER — Encounter: Payer: Self-pay | Admitting: Family Medicine

## 2024-09-12 ENCOUNTER — Encounter: Payer: Self-pay | Admitting: Family Medicine

## 2024-09-12 ENCOUNTER — Ambulatory Visit: Admitting: Family Medicine

## 2024-09-12 VITALS — BP 114/74 | HR 65 | Resp 16 | Ht 71.0 in | Wt 239.0 lb

## 2024-09-12 DIAGNOSIS — R809 Proteinuria, unspecified: Secondary | ICD-10-CM | POA: Diagnosis not present

## 2024-09-12 DIAGNOSIS — Z9989 Dependence on other enabling machines and devices: Secondary | ICD-10-CM | POA: Diagnosis not present

## 2024-09-12 DIAGNOSIS — Z794 Long term (current) use of insulin: Secondary | ICD-10-CM | POA: Diagnosis not present

## 2024-09-12 DIAGNOSIS — I209 Angina pectoris, unspecified: Secondary | ICD-10-CM | POA: Diagnosis not present

## 2024-09-12 DIAGNOSIS — Z9181 History of falling: Secondary | ICD-10-CM | POA: Diagnosis not present

## 2024-09-12 DIAGNOSIS — I10 Essential (primary) hypertension: Secondary | ICD-10-CM | POA: Diagnosis not present

## 2024-09-12 DIAGNOSIS — I152 Hypertension secondary to endocrine disorders: Secondary | ICD-10-CM

## 2024-09-12 DIAGNOSIS — Z7985 Long-term (current) use of injectable non-insulin antidiabetic drugs: Secondary | ICD-10-CM | POA: Diagnosis not present

## 2024-09-12 DIAGNOSIS — G3184 Mild cognitive impairment, so stated: Secondary | ICD-10-CM | POA: Diagnosis not present

## 2024-09-12 DIAGNOSIS — E8840 Mitochondrial metabolism disorder, unspecified: Secondary | ICD-10-CM

## 2024-09-12 DIAGNOSIS — E119 Type 2 diabetes mellitus without complications: Secondary | ICD-10-CM | POA: Diagnosis not present

## 2024-09-12 DIAGNOSIS — F5101 Primary insomnia: Secondary | ICD-10-CM | POA: Diagnosis not present

## 2024-09-12 DIAGNOSIS — E7140 Disorder of carnitine metabolism, unspecified: Secondary | ICD-10-CM | POA: Diagnosis not present

## 2024-09-12 DIAGNOSIS — R569 Unspecified convulsions: Secondary | ICD-10-CM

## 2024-09-12 DIAGNOSIS — E1129 Type 2 diabetes mellitus with other diabetic kidney complication: Secondary | ICD-10-CM

## 2024-09-12 DIAGNOSIS — M5412 Radiculopathy, cervical region: Secondary | ICD-10-CM | POA: Diagnosis not present

## 2024-09-12 DIAGNOSIS — M47812 Spondylosis without myelopathy or radiculopathy, cervical region: Secondary | ICD-10-CM | POA: Diagnosis not present

## 2024-09-12 DIAGNOSIS — I25118 Atherosclerotic heart disease of native coronary artery with other forms of angina pectoris: Secondary | ICD-10-CM | POA: Diagnosis not present

## 2024-09-12 DIAGNOSIS — Z981 Arthrodesis status: Secondary | ICD-10-CM | POA: Diagnosis not present

## 2024-09-12 DIAGNOSIS — G4733 Obstructive sleep apnea (adult) (pediatric): Secondary | ICD-10-CM | POA: Diagnosis not present

## 2024-09-12 DIAGNOSIS — E538 Deficiency of other specified B group vitamins: Secondary | ICD-10-CM

## 2024-09-12 DIAGNOSIS — M5030 Other cervical disc degeneration, unspecified cervical region: Secondary | ICD-10-CM | POA: Diagnosis not present

## 2024-09-12 DIAGNOSIS — Z7982 Long term (current) use of aspirin: Secondary | ICD-10-CM | POA: Diagnosis not present

## 2024-09-12 DIAGNOSIS — G713 Mitochondrial myopathy, not elsewhere classified: Secondary | ICD-10-CM | POA: Diagnosis not present

## 2024-09-12 DIAGNOSIS — Z23 Encounter for immunization: Secondary | ICD-10-CM

## 2024-09-12 DIAGNOSIS — E559 Vitamin D deficiency, unspecified: Secondary | ICD-10-CM | POA: Diagnosis not present

## 2024-09-12 DIAGNOSIS — E1159 Type 2 diabetes mellitus with other circulatory complications: Secondary | ICD-10-CM | POA: Diagnosis not present

## 2024-09-12 DIAGNOSIS — E782 Mixed hyperlipidemia: Secondary | ICD-10-CM | POA: Diagnosis not present

## 2024-09-12 DIAGNOSIS — Z7984 Long term (current) use of oral hypoglycemic drugs: Secondary | ICD-10-CM | POA: Diagnosis not present

## 2024-09-12 DIAGNOSIS — S83512D Sprain of anterior cruciate ligament of left knee, subsequent encounter: Secondary | ICD-10-CM | POA: Diagnosis not present

## 2024-09-12 LAB — POCT GLYCOSYLATED HEMOGLOBIN (HGB A1C): Hemoglobin A1C: 5.7 % — AB (ref 4.0–5.6)

## 2024-09-12 MED ORDER — LOSARTAN POTASSIUM-HCTZ 50-12.5 MG PO TABS: 1.0000 | ORAL_TABLET | Freq: Every day | ORAL | 1 refills | Status: AC

## 2024-09-12 MED ORDER — ROSUVASTATIN CALCIUM 10 MG PO TABS: 10.0000 mg | ORAL_TABLET | Freq: Every day | ORAL | 1 refills | Status: AC

## 2024-09-12 MED ORDER — CARVEDILOL 6.25 MG PO TABS: 6.2500 mg | ORAL_TABLET | Freq: Two times a day (BID) | ORAL | 1 refills | Status: AC

## 2024-09-12 MED ORDER — TEMAZEPAM 15 MG PO CAPS: 15.0000 mg | ORAL_CAPSULE | Freq: Every day | ORAL | 0 refills | Status: DC

## 2024-09-12 MED ORDER — TIRZEPATIDE 7.5 MG/0.5ML ~~LOC~~ SOAJ: 7.5000 mg | SUBCUTANEOUS | 0 refills | Status: DC

## 2024-09-12 NOTE — Progress Notes (Signed)
 Name: [name] [name]   MRN: [id number]    DOB: [birth year]   Date:09/12/2024       Progress Note  Subjective  Chief Complaint  Chief Complaint  Patient presents with   Medical Management of Chronic Issues   Discussed the use of AI scribe software for clinical note transcription with the patient, who gave verbal consent to proceed.  History of Present Illness [name] [name] is a 63 year old male with type 2 diabetes, hypertension, and hyperlipidemia who presents for a diabetes follow-up.  His type 2 diabetes is well-controlled with a current A1c of 5.7%, improved from 8.2% in March after attempting to discontinue medication. He is currently taking Mounjaro  7.5 mg for diabetes management and used Lantus  temporarily in August due to corticosteroid injections in his neck.  For hypertension, he is taking losartan  and hydrochlorothiazide  (50/12.5 mg) and reports no issues with these medications. He does not experience dizziness unless he stands up too quickly.  He is on rosuvastatin  10 mg for hyperlipidemia, with his last cholesterol check in May showing LDL at 62 mg/dL and HDL at 49 mg/dL.  He uses a Bipap  machine every night for obstructive sleep apnea but still experiences poor sleep, averaging about four hours per night. He takes temazepam  15 mg to aid sleep and is pleased   He has a history of mitochondrial disease, which affects his muscle strength and endurance. He is receiving physical therapy at home to improve endurance and prevent further muscle loss. His weight is stable at 239 lbs, down from 242 lbs in July.  He experiences angina class III and is on Ranexa , carvedilol , and aspirin  for management. He has not used nitroglycerin  recently.  He has osteoarthritis and is managing with a cane. He has not scheduled surgery for a knee replacement yet.    Patient Active Problem List   Diagnosis Date Noted   OSA treated with BiPAP 06/07/2024   Osteoarthritis of left knee 06/04/2024    Bilateral carpal tunnel syndrome 05/11/2023   Dyslipidemia associated with type 2 diabetes mellitus (HCC) 05/11/2023   Right groin pain 02/03/2023   Neurogenic muscular atrophy 05/08/2022   Primary insomnia 05/08/2022   Controlled type 2 diabetes mellitus with microalbuminuria, without long-term current use of insulin  (HCC) 05/08/2022   Contraindication to statin medication 05/08/2022   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HCC) 01/22/2022   Tremor 01/22/2022   Shortness of breath 01/22/2022   Muscle atrophy of lower extremity 11/10/2021   Dysphagia    Asthma 05/14/2021   Multiple thyroid  nodules 05/14/2021   Localized, primary osteoarthritis of hand 09/28/2019   Seizure-like activity (HCC) 02/22/2018   Mild cognitive impairment 11/17/2017   Radiculopathy of cervical region 11/17/2017   Stenosis of carotid artery 09/08/2017   Cervical stenosis of spine 09/08/2017   B12 deficiency 06/28/2017   Headache disorder 02/15/2017   Memory loss or impairment 02/15/2017   Coronary artery disease involving native coronary artery of native heart    Radiculitis of left cervical region 03/05/2016   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) 12/05/2015   Patellar subluxation 06/05/2015   Allergic rhinitis, seasonal 06/05/2015   Chronic constipation 06/05/2015   Chronic lower back pain 06/05/2015   Vitamin D  deficiency 06/05/2015   Central sleep apnea 06/05/2015   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   Nerve root pain 06/05/2015   Acquired polycythemia 06/05/2015   Anterior knee pain 06/05/2015   Failure of erection 06/05/2015   Gastro-esophageal reflux disease without esophagitis 06/05/2015  Neuropathy 06/05/2015   Angina, class III (HCC) 05/23/2015   Hypertension associated with diabetes (HCC) 05/23/2015   Hyperlipidemia 05/23/2015   Inguinal hernia without mention of obstruction or gangrene, recurrent unilateral or unspecified 03/24/2013    Past Surgical History:  Procedure Laterality  Date   BACK SURGERY  12/2008   X2-LUMBAR   CARDIAC CATHETERIZATION N/A 05/30/2015   Procedure: Left Heart Cath;  Surgeon: Deatrice DELENA Cage, MD;  Location: [facility];  Service: Cardiovascular;  Laterality: N/A;   CARDIAC CATHETERIZATION N/A 04/16/2016   Procedure: Left Heart Cath and Coronary Angiography;  Surgeon: Deatrice DELENA Cage, MD;  Location: [facility];  Service: Cardiovascular;  Laterality: N/A;   COLONOSCOPY  2014   Dr. Dellie   COLONOSCOPY WITH PROPOFOL  N/A 12/25/2016   Procedure: COLONOSCOPY WITH PROPOFOL ;  Surgeon: Ruel Kung, MD;  Location: [facility];  Service: Endoscopy;  Laterality: N/A;   COLONOSCOPY WITH PROPOFOL  N/A 01/29/2017   Procedure: COLONOSCOPY WITH PROPOFOL ;  Surgeon: Ruel Kung, MD;  Location: [facility];  Service: Endoscopy;  Laterality: N/A;   COLONOSCOPY WITH PROPOFOL  N/A 04/11/2020   Procedure: COLONOSCOPY WITH PROPOFOL ;  Surgeon: Kung Ruel, MD;  Location: [facility];  Service: Gastroenterology;  Laterality: N/A;   CORONARY PRESSURE/FFR STUDY N/A 07/30/2021   Procedure: INTRAVASCULAR PRESSURE WIRE/FFR STUDY;  Surgeon: Cage Deatrice DELENA, MD;  Location: [facility];  Service: Cardiovascular;  Laterality: N/A;   ESOPHAGEAL MANOMETRY N/A 03/04/2022   Procedure: ESOPHAGEAL MANOMETRY (EM);  Surgeon: Shila Gustav GAILS, MD;  Location: [facility];  Service: Gastroenterology;  Laterality: N/A;   ESOPHAGOGASTRODUODENOSCOPY N/A 05/09/2024   Procedure: EGD (ESOPHAGOGASTRODUODENOSCOPY);  Surgeon: Unk Corinn Skiff, MD;  Location: [facility];  Service: Endoscopy;  Laterality: N/A;   ESOPHAGOGASTRODUODENOSCOPY (EGD) WITH PROPOFOL  N/A 01/26/2019   Procedure: ESOPHAGOGASTRODUODENOSCOPY (EGD) WITH PROPOFOL ;  Surgeon: Unk Corinn Skiff, MD;  Location: [facility];  Service: Gastroenterology;  Laterality: N/A;   ESOPHAGOGASTRODUODENOSCOPY (EGD) WITH PROPOFOL  N/A 09/10/2021   Procedure: ESOPHAGOGASTRODUODENOSCOPY (EGD) WITH  PROPOFOL ;  Surgeon: Unk Corinn Skiff, MD;  Location: [facility];  Service: Gastroenterology;  Laterality: N/A;   EVALUATION UNDER ANESTHESIA WITH HEMORRHOIDECTOMY N/A 02/24/2017   Procedure: EXAM UNDER ANESTHESIA WITH POSSIBLE EXCISION OF INTERNAL HEMORRHOIDS;  Surgeon: Aloysius Plant, MD;  Location: [facility];  Service: General;  Laterality: N/A;   FISSURECTOMY  02/24/2017   Procedure: FISSURECTOMY;  Surgeon: Aloysius Plant, MD;  Location: [facility];  Service: General;;   HAND SURGERY Left 2020   HERNIA REPAIR Right 1991   INGUINAL HERNIA REPAIR Right 2012   Dr Dellie FONTANA HERNIA REPAIR Right 2014   Dr Dellie   LEFT HEART CATH AND CORONARY ANGIOGRAPHY Left 08/05/2020   Procedure: LEFT HEART CATH AND CORONARY ANGIOGRAPHY poss PCI;  Surgeon: Cage Deatrice DELENA, MD;  Location: [facility];  Service: Cardiovascular;  Laterality: Left;   MUSCLE BIOPSY Left 12/31/2021   Procedure: LEFT VASTUS LATERALIS BIOPSY;  Surgeon: Clois Fret, MD;  Location: [facility];  Service: Neurosurgery;  Laterality: Left;   RIGHT/LEFT HEART CATH AND CORONARY ANGIOGRAPHY N/A 07/30/2021   Procedure: RIGHT/LEFT HEART CATH AND CORONARY ANGIOGRAPHY;  Surgeon: Cage Deatrice DELENA, MD;  Location: [facility];  Service: Cardiovascular;  Laterality: N/A;   SPINE SURGERY  2007   WRIST SURGERY Left    XI ROBOT ASSISTED DIAGNOSTIC LAPAROSCOPY Right 02/03/2023   Procedure: DIAGNOSTIC LAPAROSCOPY;  Surgeon: Plant Aloysius, MD;  Location: [facility];  Service: General;  Laterality: Right;  Family History  Problem Relation Age of Onset   Hypertension Mother    Heart Problems Mother    High Cholesterol Mother    Arthritis Mother    Hearing loss Mother    Vision loss Mother    Heart Problems Father 1       myocardial infarction   Diabetes Father    Mental illness Brother    Other Maternal Aunt        COVID   Lung cancer Maternal Aunt    Lung cancer Maternal Aunt    Cancer Maternal Uncle         unknown   Prostate cancer Maternal Uncle    Testicular cancer Paternal Uncle    Thyroid  disease Daughter    Breast cancer Cousin     Social History   Tobacco Use   Smoking status: Former    Types: Cigars    Start date: 12/28/2002    Quit date: 06/04/2005    Years since quitting: 19.2   Smokeless tobacco: Never   Tobacco comments:    quit 2006-smoked 1 cigar occ  Substance Use Topics   Alcohol use: Not Currently     Current Outpatient Medications:    ACCU-CHEK GUIDE test strip, USE TO TEST BLOOD SUGAR THREE TIMES DAILY AS NEEDED, Disp: 100 strip, Rfl: 1   Accu-Chek Softclix Lancets lancets, TEST TWICE DAILY, Disp: 100 each, Rfl: 3   Alpha Lipoic Acid 200 MG CAPS, Take 200 mg by mouth daily., Disp: , Rfl:    aspirin  EC 81 MG tablet, Take by mouth., Disp: , Rfl:    blood glucose meter kit and supplies, 1 each by Other route in the morning and at bedtime. Dispense based on patient and insurance preference. Use up to two  times daily. (FOR ICD-10 E10.9, E11.9)., Disp: 1 each, Rfl: 0   carvedilol  (COREG ) 6.25 MG tablet, TAKE 1 TABLET(6.25 MG) BY MOUTH TWICE DAILY WITH A MEAL, Disp: 180 tablet, Rfl: 0   Coenzyme Q10 (COQ-10) 100 MG CAPS, Take 100 mg by mouth daily., Disp: , Rfl:    ezetimibe  (ZETIA ) 10 MG tablet, TAKE 1 TABLET(10 MG) BY MOUTH DAILY, Disp: 90 tablet, Rfl: 0   fluticasone  (FLONASE ) 50 MCG/ACT nasal spray, INSTILL 1 SPRAY INTO BOTH NOSTRILS DAILY AS NEEDED, Disp: 48 g, Rfl: 0   fluticasone  furoate-vilanterol (BREO ELLIPTA ) 100-25 MCG/ACT AEPB, INHALE 1 PUFF INTO THE LUNGS DAILY, Disp: 60 each, Rfl: 6   hydrocortisone  2.5 % cream, Apply to hyperpigmentation on face QD on Monday, Wednesday, and Friday PRN., Disp: 28 g, Rfl: 3   insulin  glargine (LANTUS  SOLOSTAR) 100 UNIT/ML Solostar Pen, Inject 5-10 Units into the skin daily., Disp: 15 mL, Rfl: PRN   Insulin  Pen Needle 32G X 6 MM MISC, 1 each by Does not apply route daily at 12 noon., Disp: 100 each, Rfl: 0   ketoconazole   (NIZORAL ) 2 % shampoo, Wash scalp/body/face QD on Tuesday, Thursday, and Saturday, let sit 5 minutes before rinsing off, avoid eyes., Disp: 120 mL, Rfl: 11   linaclotide  (LINZESS ) 145 MCG CAPS capsule, Take 1 capsule (145 mcg total) by mouth daily before breakfast., Disp: 90 capsule, Rfl: 0   losartan -hydrochlorothiazide  (HYZAAR) 50-12.5 MG tablet, TAKE 1 TABLET BY MOUTH DAILY, Disp: 90 tablet, Rfl: 0   meloxicam (MOBIC) 7.5 MG tablet, Take 7.5 mg by mouth daily., Disp: , Rfl:    nitroGLYCERIN  (NITROSTAT ) 0.4 MG SL tablet, Place 1 tablet (0.4 mg total) under the tongue every 5 (five) minutes  as needed for chest pain., Disp: , Rfl:    nystatin  (MYCOSTATIN ) 100000 UNIT/ML suspension, Take 5 mLs (500,000 Units total) by mouth 4 (four) times daily., Disp: 473 mL, Rfl: 1   ranolazine  (RANEXA ) 1000 MG SR tablet, TAKE 1 TABLET(1000 MG) BY MOUTH TWICE DAILY, Disp: 180 tablet, Rfl: 3   Riboflavin (B-2) 100 MG TABS, Take 100 mg by mouth daily., Disp: , Rfl:    rosuvastatin  (CRESTOR ) 10 MG tablet, TAKE 1 TABLET(10 MG) BY MOUTH DAILY, Disp: 90 tablet, Rfl: 0   sildenafil  (VIAGRA ) 100 MG tablet, TAKE 1 TABLET BY MOUTH DAILY AS NEEDED FOR ERECTILE DYSFUNCTION, Disp: 30 tablet, Rfl: 0   Sulfacetamide  Sodium 9.8 % SHAM, Shampoo into the face and trunk QD on Monday, Wednesday, and Friday. Let sit a few minutes before washing off., Disp: 120 mL, Rfl: 11   temazepam  (RESTORIL ) 15 MG capsule, Take 1 capsule (15 mg total) by mouth at bedtime., Disp: 90 capsule, Rfl: 0   tirzepatide  (MOUNJARO ) 7.5 MG/0.5ML Pen, Inject 7.5 mg into the skin once a week., Disp: 2 mL, Rfl: 0   Vitamin D , Ergocalciferol , (DRISDOL ) 1.25 MG (50000 UNIT) CAPS capsule, Take 1 capsule (50,000 Units total) by mouth every 7 (seven) days., Disp: 12 capsule, Rfl: 1   omeprazole  (PRILOSEC) 40 MG capsule, Take 1 capsule (40 mg total) by mouth 2 (two) times daily before a meal., Disp: 180 capsule, Rfl: 0  Allergies  Allergen Reactions   Gabapentin  Other  (See Comments)    Groggy-Mood Changes   Latex Rash   Penicillins Hives and Rash    Tolerated 1st generation cephalosporin (CEFAZOLIN ) on 12/31/2021 with no documented ADRs.  PCN reaction causing immediate rash, facial/tongue/throat swelling, SOB or lightheadedness with hypotension: Yes PCN reaction causing severe rash involving mucus membranes or skin necrosis: No PCN reaction that required hospitalization No PCN reaction occurring within the last 10 years: No If all of the above answers are NO, then may proceed with Cephalosporin use.    Cymbalta [Duloxetine Hcl] Other (See Comments)    Elevated BP Headache     I personally reviewed active problem list, medication list, allergies, family history with the patient/caregiver today.   ROS  Ten systems reviewed and is negative except as mentioned in HPI    Objective Physical Exam  CONSTITUTIONAL: Patient appears well-developed and well-nourished.  No distress. HEENT: Head atraumatic, normocephalic, neck supple. CARDIOVASCULAR: Normal rate, regular rhythm and normal heart sounds.  No murmur heard. No BLE edema. PULMONARY: Effort normal and breath sounds normal. No respiratory distress. ABDOMINAL: There is no tenderness or distention. MUSCULOSKELETAL: using a cane PSYCHIATRIC: Patient has a normal mood and affect. behavior is normal. Judgment and thought content normal.  Vitals:   09/12/24 0818  BP: 114/74  Pulse: 65  Resp: 16  SpO2: 97%  Weight: 239 lb (108.4 kg)  Height: 5' 11 (1.803 m)    Body mass index is 33.33 kg/m.  Recent Results (from the past 2160 hours)  POCT glycosylated hemoglobin (Hb A1C)     Status: Abnormal   Collection Time: 09/12/24  8:26 AM  Result Value Ref Range   Hemoglobin A1C 5.7 (A) 4.0 - 5.6 %   HbA1c POC (<> result, manual entry)     HbA1c, POC (prediabetic range)     HbA1c, POC (controlled diabetic range)      Diabetic Foot Exam:     PHQ2/9:    09/12/2024    8:16 AM 06/07/2024     8:59 AM  05/16/2024    8:18 AM 02/21/2024   11:12 AM 01/14/2024    1:20 PM  Depression screen PHQ 2/9  Decreased Interest 0 0 0 0 0  Down, Depressed, Hopeless 0 0 0 0 0  PHQ - 2 Score 0 0 0 0 0  Altered sleeping    0 0  Tired, decreased energy    0 0  Change in appetite    0 0  Feeling bad or failure about yourself     0 0  Trouble concentrating    0 0  Moving slowly or fidgety/restless    0 0  Suicidal thoughts    0 0  PHQ-9 Score    0 0  Difficult doing work/chores    Not difficult at all Not difficult at all    phq 9 is negative  Fall Risk:    09/12/2024    8:16 AM 06/07/2024    8:58 AM 05/16/2024    8:17 AM 01/14/2024    1:20 PM 11/29/2023    8:49 AM  Fall Risk   Falls in the past year? 0 1 1 1 1   Number falls in past yr: 0 1 1 1 1   Injury with Fall? 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 Proteinuria managed with losartan . - Continue Mounjaro  7.5 mg. - Continue losartan  for renal protection. - Monitor A1c and renal function.  Hypertension associated with diabetes Blood pressure stable at 114/74 mmHg. - Continue losartan  and carvedilol . - Monitor blood pressure and adjust if dizziness or hypotension occurs.  Hyperlipidemia Lipid levels within target range with rosuvastatin . - Continue rosuvastatin  10 mg. - Monitor lipid levels.  Angina pectoris, class III Class III angina managed with Ranexa , carvedilol , and aspirin .  Pain muscular, not cardiac. - Continue Ranexa , carvedilol , and aspirin . - Educated on avoiding nitroglycerin  with erectile dysfunction medications.  Obstructive sleep apnea Managed with CPAP. Reports dryness and unrestful sleep. - Continue CPAP use. - Consider humidifier for CPAP.  Insomnia Insomnia persists despite temazepam . - Prescribe 90-day supply of temazepam  using GoodRx. - Monitor sleep patterns and effectiveness.  Mitochondrial disease Muscle deficiency managed with physical therapy. No significant muscle mass loss. - Continue physical therapy for endurance. - Monitor muscle mass and function.  Osteoarthritis of knee Conservative management due to age and functional status. Surgery discussed for future debilitating pain. - Monitor knee pain and function. - Consider knee replacement if pain becomes debilitating.  Erectile dysfunction Managed with medication. No recent issues. - Continue current management.

## 2024-09-13 ENCOUNTER — Encounter: Payer: Self-pay | Admitting: Family Medicine

## 2024-09-13 ENCOUNTER — Other Ambulatory Visit: Payer: Self-pay

## 2024-09-13 MED ORDER — ACCU-CHEK SOFTCLIX LANCETS MISC: 1.0000 | Freq: Two times a day (BID) | 3 refills | Status: DC

## 2024-09-13 MED ORDER — BLOOD GLUCOSE TEST VI STRP: ORAL_STRIP | 3 refills | Status: AC

## 2024-09-13 MED ORDER — BLOOD GLUCOSE MONITORING SUPPL DEVI: 1.0000 | Freq: Three times a day (TID) | 0 refills | Status: AC

## 2024-09-13 MED ORDER — BLOOD GLUCOSE TEST VI STRP: ORAL_STRIP | 3 refills | Status: DC

## 2024-09-14 DIAGNOSIS — Z7985 Long-term (current) use of injectable non-insulin antidiabetic drugs: Secondary | ICD-10-CM | POA: Diagnosis not present

## 2024-09-14 DIAGNOSIS — M47812 Spondylosis without myelopathy or radiculopathy, cervical region: Secondary | ICD-10-CM | POA: Diagnosis not present

## 2024-09-14 DIAGNOSIS — M5412 Radiculopathy, cervical region: Secondary | ICD-10-CM | POA: Diagnosis not present

## 2024-09-14 DIAGNOSIS — S83512D Sprain of anterior cruciate ligament of left knee, subsequent encounter: Secondary | ICD-10-CM | POA: Diagnosis not present

## 2024-09-14 DIAGNOSIS — Z9181 History of falling: Secondary | ICD-10-CM | POA: Diagnosis not present

## 2024-09-14 DIAGNOSIS — Z981 Arthrodesis status: Secondary | ICD-10-CM | POA: Diagnosis not present

## 2024-09-14 DIAGNOSIS — Z9989 Dependence on other enabling machines and devices: Secondary | ICD-10-CM | POA: Diagnosis not present

## 2024-09-14 DIAGNOSIS — Z7984 Long term (current) use of oral hypoglycemic drugs: Secondary | ICD-10-CM | POA: Diagnosis not present

## 2024-09-14 DIAGNOSIS — Z794 Long term (current) use of insulin: Secondary | ICD-10-CM | POA: Diagnosis not present

## 2024-09-14 DIAGNOSIS — E559 Vitamin D deficiency, unspecified: Secondary | ICD-10-CM | POA: Diagnosis not present

## 2024-09-14 DIAGNOSIS — I10 Essential (primary) hypertension: Secondary | ICD-10-CM | POA: Diagnosis not present

## 2024-09-14 DIAGNOSIS — I25118 Atherosclerotic heart disease of native coronary artery with other forms of angina pectoris: Secondary | ICD-10-CM | POA: Diagnosis not present

## 2024-09-14 DIAGNOSIS — G4733 Obstructive sleep apnea (adult) (pediatric): Secondary | ICD-10-CM | POA: Diagnosis not present

## 2024-09-14 DIAGNOSIS — E119 Type 2 diabetes mellitus without complications: Secondary | ICD-10-CM | POA: Diagnosis not present

## 2024-09-14 DIAGNOSIS — Z7982 Long term (current) use of aspirin: Secondary | ICD-10-CM | POA: Diagnosis not present

## 2024-09-14 DIAGNOSIS — G713 Mitochondrial myopathy, not elsewhere classified: Secondary | ICD-10-CM | POA: Diagnosis not present

## 2024-09-14 DIAGNOSIS — E7140 Disorder of carnitine metabolism, unspecified: Secondary | ICD-10-CM | POA: Diagnosis not present

## 2024-09-14 DIAGNOSIS — G3184 Mild cognitive impairment, so stated: Secondary | ICD-10-CM | POA: Diagnosis not present

## 2024-09-14 DIAGNOSIS — M5030 Other cervical disc degeneration, unspecified cervical region: Secondary | ICD-10-CM | POA: Diagnosis not present

## 2024-09-14 DIAGNOSIS — E782 Mixed hyperlipidemia: Secondary | ICD-10-CM | POA: Diagnosis not present

## 2024-09-14 DIAGNOSIS — E538 Deficiency of other specified B group vitamins: Secondary | ICD-10-CM | POA: Diagnosis not present

## 2024-09-18 DIAGNOSIS — Z9989 Dependence on other enabling machines and devices: Secondary | ICD-10-CM | POA: Diagnosis not present

## 2024-09-18 DIAGNOSIS — Z981 Arthrodesis status: Secondary | ICD-10-CM | POA: Diagnosis not present

## 2024-09-18 DIAGNOSIS — M5030 Other cervical disc degeneration, unspecified cervical region: Secondary | ICD-10-CM | POA: Diagnosis not present

## 2024-09-18 DIAGNOSIS — M47812 Spondylosis without myelopathy or radiculopathy, cervical region: Secondary | ICD-10-CM | POA: Diagnosis not present

## 2024-09-18 DIAGNOSIS — Z7984 Long term (current) use of oral hypoglycemic drugs: Secondary | ICD-10-CM | POA: Diagnosis not present

## 2024-09-18 DIAGNOSIS — E7140 Disorder of carnitine metabolism, unspecified: Secondary | ICD-10-CM | POA: Diagnosis not present

## 2024-09-18 DIAGNOSIS — Z794 Long term (current) use of insulin: Secondary | ICD-10-CM | POA: Diagnosis not present

## 2024-09-18 DIAGNOSIS — E538 Deficiency of other specified B group vitamins: Secondary | ICD-10-CM | POA: Diagnosis not present

## 2024-09-18 DIAGNOSIS — Z9181 History of falling: Secondary | ICD-10-CM | POA: Diagnosis not present

## 2024-09-18 DIAGNOSIS — G4733 Obstructive sleep apnea (adult) (pediatric): Secondary | ICD-10-CM | POA: Diagnosis not present

## 2024-09-18 DIAGNOSIS — E119 Type 2 diabetes mellitus without complications: Secondary | ICD-10-CM | POA: Diagnosis not present

## 2024-09-18 DIAGNOSIS — Z7985 Long-term (current) use of injectable non-insulin antidiabetic drugs: Secondary | ICD-10-CM | POA: Diagnosis not present

## 2024-09-18 DIAGNOSIS — I25118 Atherosclerotic heart disease of native coronary artery with other forms of angina pectoris: Secondary | ICD-10-CM | POA: Diagnosis not present

## 2024-09-18 DIAGNOSIS — E559 Vitamin D deficiency, unspecified: Secondary | ICD-10-CM | POA: Diagnosis not present

## 2024-09-18 DIAGNOSIS — G3184 Mild cognitive impairment, so stated: Secondary | ICD-10-CM | POA: Diagnosis not present

## 2024-09-18 DIAGNOSIS — I10 Essential (primary) hypertension: Secondary | ICD-10-CM | POA: Diagnosis not present

## 2024-09-18 DIAGNOSIS — M5412 Radiculopathy, cervical region: Secondary | ICD-10-CM | POA: Diagnosis not present

## 2024-09-18 DIAGNOSIS — E782 Mixed hyperlipidemia: Secondary | ICD-10-CM | POA: Diagnosis not present

## 2024-09-18 DIAGNOSIS — Z7982 Long term (current) use of aspirin: Secondary | ICD-10-CM | POA: Diagnosis not present

## 2024-09-18 DIAGNOSIS — S83512D Sprain of anterior cruciate ligament of left knee, subsequent encounter: Secondary | ICD-10-CM | POA: Diagnosis not present

## 2024-09-18 DIAGNOSIS — G713 Mitochondrial myopathy, not elsewhere classified: Secondary | ICD-10-CM | POA: Diagnosis not present

## 2024-09-19 ENCOUNTER — Encounter: Payer: Self-pay | Admitting: Family Medicine

## 2024-09-21 ENCOUNTER — Ambulatory Visit

## 2024-09-21 ENCOUNTER — Ambulatory Visit (INDEPENDENT_AMBULATORY_CARE_PROVIDER_SITE_OTHER)

## 2024-09-21 DIAGNOSIS — E538 Deficiency of other specified B group vitamins: Secondary | ICD-10-CM | POA: Diagnosis not present

## 2024-09-21 MED ORDER — CYANOCOBALAMIN 1000 MCG/ML IJ SOLN
1000.0000 ug | Freq: Once | INTRAMUSCULAR | Status: AC
  Administered 2024-09-21: 1000 ug via INTRAMUSCULAR

## 2024-09-21 NOTE — Progress Notes (Signed)
 Patient is in office today for a nurse visit for B12 Injection. Patient Injection was given in the  Left deltoid. Patient tolerated injection well.

## 2024-09-27 ENCOUNTER — Ambulatory Visit: Payer: Medicaid Other | Admitting: Dermatology

## 2024-09-27 DIAGNOSIS — L819 Disorder of pigmentation, unspecified: Secondary | ICD-10-CM | POA: Diagnosis not present

## 2024-09-27 DIAGNOSIS — L700 Acne vulgaris: Secondary | ICD-10-CM | POA: Diagnosis not present

## 2024-09-27 DIAGNOSIS — L219 Seborrheic dermatitis, unspecified: Secondary | ICD-10-CM

## 2024-09-27 DIAGNOSIS — D170 Benign lipomatous neoplasm of skin and subcutaneous tissue of head, face and neck: Secondary | ICD-10-CM

## 2024-09-27 DIAGNOSIS — B360 Pityriasis versicolor: Secondary | ICD-10-CM

## 2024-09-27 DIAGNOSIS — L739 Follicular disorder, unspecified: Secondary | ICD-10-CM | POA: Diagnosis not present

## 2024-09-27 DIAGNOSIS — Z79899 Other long term (current) drug therapy: Secondary | ICD-10-CM

## 2024-09-27 DIAGNOSIS — L810 Postinflammatory hyperpigmentation: Secondary | ICD-10-CM

## 2024-09-27 DIAGNOSIS — Z7189 Other specified counseling: Secondary | ICD-10-CM

## 2024-09-27 MED ORDER — KETOCONAZOLE 2 % EX SHAM: MEDICATED_SHAMPOO | CUTANEOUS | 11 refills | Status: AC

## 2024-09-27 MED ORDER — SULFACETAMIDE SODIUM 9.8 % EX SHAM: MEDICATED_SHAMPOO | CUTANEOUS | 11 refills | Status: AC

## 2024-09-27 MED ORDER — HYDROCORTISONE 2.5 % EX CREA: TOPICAL_CREAM | CUTANEOUS | 5 refills | Status: AC

## 2024-09-27 NOTE — Progress Notes (Unsigned)
 Follow-Up Visit   Subjective  [name] [name] is a 63 y.o. male who presents for the following: acne/folliculitis/ tinea versicolor. Patient has discontinued  hydroquinone mix. He is still using Hydrocortisone  cream and ketoconazole  shampoo and states they are working well.   The following portions of the chart were reviewed this encounter and updated as appropriate: medications, allergies, medical history  Review of Systems:  No other skin or systemic complaints except as noted in HPI or Assessment and Plan.  Objective  Well appearing patient in no apparent distress; mood and affect are within normal limits.  A focused examination was performed of the following areas: Face, Scalp, Chest   Relevant exam findings are noted in the Assessment and Plan.                 Assessment & Plan   Hyperpigmentation of the face  2ndary to eczema vs Seb Derm with possible component of Melasma    Chronic and persistent condition with duration or expected duration over one year. Condition is improving with treatment but not currently at goal. Pt has noticed some improvement with Hydrocortisone  treatment   Melasma is a chronic; persistent condition of hyperpigmented patches generally on the face, worse in summer due to higher UV exposure.    Heredity; thyroid  disease; sun exposure; pregnancy; birth control pills; epilepsy medication and darker skin may predispose to Melasma.   Recommendations include: - Sun avoidance and daily broad spectrum (UVA/UVB) tinted mineral sunscreen SPF 30+, with Zinc or Titanium Dioxide. - Rx topical bleaching creams (i.e. hydroquinone) is a common treatment but should not be used long term.  Hydroquinones may be mixed with retinoids; vitamin C; steroids; Kojic Acid. - Alastin A-luminate, retinoids, vitamin C, topical tranexamic acid, glycolic acid and kojic acid can be used for brightening while on break from hydroquinone - Rx Azelaic Acid is also a  treatment option that is safe for pregnancy (Category B). - OTC Heliocare can be helpful in control and prevention. - Oral Rx with Tranexamic Acid 250 mg - 650 mg po daily can be used for moderate to severe cases especially during summer (contraindications include pregnancy; lactation; hx of PE; hx of DVT; clotting disorder; heart disease; anticoagulant use and upcoming long trips)   - Chemical peels (would need multiple for best result).  - Lasers and  Microdermabrasion may also be helpful adjunct treatments.   Exam: Confluent hyperpigmentation from cheeks down to mandible  Improved from 08/04/23 photos.    Plan:  Continue HC 2.5% cream apply topically to dark areas at face and shoulders Q 3 days per week at HS on M,W, F PRN.      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 See photos  Chronic and persistent condition with duration or expected duration over one year. Condition is improving with treatment but not currently at goal. Treatment Plan: Continue Ketoconazole  2 % shampoo alternating with Josef's Sulfur  soap as body wash to affected areas 3 days weekly. Start Skin Medicinals Sulfacetamide  Sodium 9% / Sulfur  3% Foaming Wash; Shampoo into the face and trunk QD on Monday, Wednesday, and Friday. Let sit a few minutes before washing off.    Lipoma  Exam: Recurrent Subcutaneous rubbery nodule(s) Location: Left forehead over left brow  Benign-appearing. Exam most consistent with an  Lipoma. Discussed that a Lipoma is a benign fatty growth that can grow over time and sometimes become painful or otherwise symptomatic. Some patients may have one or several lipomas.. Benign Hereditary Lipomatosis is a hereditary familial condition where family members tend to grow multiple lipomas.   Recommend observation if it is not changing, growing or symptomatic. Recommend surgical excision to remove it if it is painful, growing, symptomatic, or other changes noted. Please contact our office for new or changing lesions so they can be evaluated.  TINEA VERSICOLOR   Related Medications ketoconazole  (NIZORAL ) 2 % shampoo Wash scalp/body/face QD on Tuesday, Thursday, and Saturday, let sit 5 minutes before rinsing off, avoid eyes.  Return in about 1 year (around 09/27/2025) for Seb Derm, Melasma.  I, Emerick Ege, CMA am acting as scribe for Alm Rhyme, MD   Documentation: I have reviewed the above documentation for accuracy and completeness, and I agree with the above.  Alm Rhyme, MD

## 2024-09-27 NOTE — Patient Instructions (Signed)

## 2024-09-28 ENCOUNTER — Encounter: Payer: Self-pay | Admitting: Dermatology

## 2024-10-10 ENCOUNTER — Encounter: Payer: Self-pay | Admitting: Nurse Practitioner

## 2024-10-10 ENCOUNTER — Ambulatory Visit: Attending: Nurse Practitioner | Admitting: Nurse Practitioner

## 2024-10-10 VITALS — BP 136/78 | HR 63 | Ht 71.0 in | Wt 241.8 lb

## 2024-10-10 DIAGNOSIS — E118 Type 2 diabetes mellitus with unspecified complications: Secondary | ICD-10-CM | POA: Diagnosis not present

## 2024-10-10 DIAGNOSIS — I10 Essential (primary) hypertension: Secondary | ICD-10-CM | POA: Insufficient documentation

## 2024-10-10 DIAGNOSIS — Z794 Long term (current) use of insulin: Secondary | ICD-10-CM | POA: Diagnosis not present

## 2024-10-10 DIAGNOSIS — G4733 Obstructive sleep apnea (adult) (pediatric): Secondary | ICD-10-CM | POA: Insufficient documentation

## 2024-10-10 DIAGNOSIS — I25118 Atherosclerotic heart disease of native coronary artery with other forms of angina pectoris: Secondary | ICD-10-CM | POA: Insufficient documentation

## 2024-10-10 DIAGNOSIS — E8840 Mitochondrial metabolism disorder, unspecified: Secondary | ICD-10-CM | POA: Insufficient documentation

## 2024-10-10 DIAGNOSIS — R072 Precordial pain: Secondary | ICD-10-CM | POA: Insufficient documentation

## 2024-10-10 DIAGNOSIS — E785 Hyperlipidemia, unspecified: Secondary | ICD-10-CM | POA: Diagnosis not present

## 2024-10-10 MED ORDER — NITROGLYCERIN 0.4 MG SL SUBL: 0.4000 mg | SUBLINGUAL_TABLET | SUBLINGUAL | 1 refills | Status: AC | PRN

## 2024-10-10 NOTE — Progress Notes (Signed)
 Office Visit    Patient Name: [name] [name] Date of Encounter: 10/10/2024  Primary Care Provider:  Sowles, Krichna, MD Primary Cardiologist:  Deatrice Cage, MD  Cardiology APP:  Vivienne Lonni Ingle, NP   Chief Complaint    63 y.o. male with a history of nonobstructive CAD, stable angina, hypertension, hyperlipidemia, diabetes, obstructive sleep apnea on CPAP, remote tobacco abuse, obesity, and mitochondrial myopathy followed at [facility], who presents for follow-up related to chest pain and CAD.  Past Medical History   Subjective   Past Medical History:  Diagnosis Date   Allergy    Angina, class III    Arthritis 2010   CAD (coronary artery disease)    a. 05/30/2015 cath: mLAD 50% (FFR 0.83)->Med Rx; b. 03/2016 Cath: LAD 56m-->Med Rx; c. 07/2020 Cath: LAD 37m; d. 07/2021 Cath: LM nl, LAD 10ost/m, 30m (nl FFR), D1/D2/D3 min irregs, LCX min irregs, OM1/2/3 nl, RCA min irregs, RPDA/RPAV/RPL1/RPL2 nl; e. 05/2024 Cor CTA: Ca2+ = 1111 (99th%'ile). Diffuse 25-49% stenoses. Total plaque vol 873m^3 (76th %'ile).   Chronic constipation    Degeneration of intervertebral disc of cervical region    Diastolic dysfunction    a.) TTE 07/22/2017: EF 60-65%, G1DD; b.) TTE 02/14/2021: EF 60-65%, G1DD; c.) TTE 01/19/2023: EF 55-60%, AoV sclerosis without stenosis, G1DD   Dyspnea    Erectile dysfunction    a.) on PDE5i (sildenafil )   GERD (gastroesophageal reflux disease)    Hernia 1991   Hyperlipidemia    Hypertension 2008   Mitochondrial myopathy    a.) followed by medical genetics specialist (Dr. Vaughan Neighbours, MD) at [facility]   Nerve root pain    Neuropathy    Obesity, unspecified 2012   OSA on CPAP    Personal history of tobacco use, presenting hazards to health 2012   Polycythemia    Rectal bleeding 02/11/2017   Recurrent Right Inguinal Hernia Repair 02/09/2011, 04/07/2013   Seizure (HCC)    pt states neurologist thinking he was having seizures while sleeping-eeg done 10-2022 and was  normal   Sleep apnea    Sleep difficulties    a.) on BZO (temazepam ) PRN   T2DM (type 2 diabetes mellitus) (HCC)    Umbilical hernia without mention of obstruction or gangrene 02/09/2011   Vitamin D  deficiency    Past Surgical History:  Procedure Laterality Date   BACK SURGERY  12/2008   [facility]   CARDIAC CATHETERIZATION N/A 05/30/2015   Procedure: Left Heart Cath;  Surgeon: Deatrice DELENA Cage, MD;  Location: [facility] INVASIVE CV LAB;  Service: Cardiovascular;  Laterality: N/A;   CARDIAC CATHETERIZATION N/A 04/16/2016   Procedure: Left Heart Cath and Coronary Angiography;  Surgeon: Deatrice DELENA Cage, MD;  Location: [facility] INVASIVE CV LAB;  Service: Cardiovascular;  Laterality: N/A;   COLONOSCOPY  2014   Dr. Dellie   COLONOSCOPY WITH PROPOFOL  N/A 12/25/2016   Procedure: COLONOSCOPY WITH PROPOFOL ;  Surgeon: Ruel Kung, MD;  Location: [facility] ENDOSCOPY;  Service: Endoscopy;  Laterality: N/A;   COLONOSCOPY WITH PROPOFOL  N/A 01/29/2017   Procedure: COLONOSCOPY WITH PROPOFOL ;  Surgeon: Ruel Kung, MD;  Location: [facility] ENDOSCOPY;  Service: Endoscopy;  Laterality: N/A;   COLONOSCOPY WITH PROPOFOL  N/A 04/11/2020   Procedure: COLONOSCOPY WITH PROPOFOL ;  Surgeon: Kung Ruel, MD;  Location: [facility] ENDOSCOPY;  Service: Gastroenterology;  Laterality: N/A;   CORONARY PRESSURE/FFR STUDY N/A 07/30/2021   Procedure: INTRAVASCULAR PRESSURE WIRE/FFR STUDY;  Surgeon: Cage Deatrice DELENA, MD;  Location: [facility] CV LAB;  Service: Cardiovascular;  Laterality:  N/A;   ESOPHAGEAL MANOMETRY N/A 03/04/2022   Procedure: ESOPHAGEAL MANOMETRY (EM);  Surgeon: Shila Gustav GAILS, MD;  Location: [facility] ENDOSCOPY;  Service: Gastroenterology;  Laterality: N/A;   ESOPHAGOGASTRODUODENOSCOPY N/A 05/09/2024   Procedure: EGD (ESOPHAGOGASTRODUODENOSCOPY);  Surgeon: Unk Corinn Skiff, MD;  Location: [facility];  Service: Endoscopy;  Laterality: N/A;   ESOPHAGOGASTRODUODENOSCOPY (EGD) WITH PROPOFOL  N/A 01/26/2019   Procedure:  ESOPHAGOGASTRODUODENOSCOPY (EGD) WITH PROPOFOL ;  Surgeon: Unk Corinn Skiff, MD;  Location: [facility] ENDOSCOPY;  Service: Gastroenterology;  Laterality: N/A;   ESOPHAGOGASTRODUODENOSCOPY (EGD) WITH PROPOFOL  N/A 09/10/2021   Procedure: ESOPHAGOGASTRODUODENOSCOPY (EGD) WITH PROPOFOL ;  Surgeon: Unk Corinn Skiff, MD;  Location: [facility] ENDOSCOPY;  Service: Gastroenterology;  Laterality: N/A;   EVALUATION UNDER ANESTHESIA WITH HEMORRHOIDECTOMY N/A 02/24/2017   Procedure: EXAM UNDER ANESTHESIA WITH POSSIBLE EXCISION OF INTERNAL HEMORRHOIDS;  Surgeon: Aloysius Plant, MD;  Location: [facility] ORS;  Service: General;  Laterality: N/A;   FISSURECTOMY  02/24/2017   Procedure: FISSURECTOMY;  Surgeon: Aloysius Plant, MD;  Location: [facility] ORS;  Service: General;;   HAND SURGERY Left 2020   HERNIA REPAIR Right 1991   INGUINAL HERNIA REPAIR Right 2012   Dr Dellie FONTANA HERNIA REPAIR Right 2014   Dr Dellie   LEFT HEART CATH AND CORONARY ANGIOGRAPHY Left 08/05/2020   Procedure: LEFT HEART CATH AND CORONARY ANGIOGRAPHY poss PCI;  Surgeon: Darron Deatrice LABOR, MD;  Location: [facility] CV [facility];  Service: Cardiovascular;  Laterality: Left;   MUSCLE BIOPSY Left 12/31/2021   Procedure: LEFT VASTUS LATERALIS BIOPSY;  Surgeon: Clois Fret, MD;  Location: [facility] ORS;  Service: Neurosurgery;  Laterality: Left;   RIGHT/LEFT HEART CATH AND CORONARY ANGIOGRAPHY N/A 07/30/2021   Procedure: RIGHT/LEFT HEART CATH AND CORONARY ANGIOGRAPHY;  Surgeon: Darron Deatrice LABOR, MD;  Location: [facility];  Service: Cardiovascular;  Laterality: N/A;   SPINE SURGERY  2007   WRIST SURGERY Left    XI ROBOT ASSISTED DIAGNOSTIC LAPAROSCOPY Right 02/03/2023   Procedure: DIAGNOSTIC LAPAROSCOPY;  Surgeon: Plant Aloysius, MD;  Location: [facility] ORS;  Service: General;  Laterality: Right;    Allergies  Allergies  Allergen Reactions   Gabapentin  Other (See Comments)    Groggy-Mood Changes   Latex Rash   Penicillins Hives and Rash     Tolerated 1st generation cephalosporin (CEFAZOLIN ) on 12/31/2021 with no documented ADRs.  PCN reaction causing immediate rash, facial/tongue/throat swelling, SOB or lightheadedness with hypotension: Yes PCN reaction causing severe rash involving mucus membranes or skin necrosis: No PCN reaction that required hospitalization No PCN reaction occurring within the last 10 years: No If all of the above answers are NO, then may proceed with Cephalosporin use.    Cymbalta [Duloxetine Hcl] Other (See Comments)    Elevated BP Headache        History of Present Illness      63 y.o. y/o male with a history of nonobstructive CAD, stable angina, hypertension, hyperlipidemia, diabetes, obstructive sleep apnea on CPAP, remote tobacco abuse, obesity, and mitochondrial myopathy followed at [facility].  In the setting of stable angina, patient has undergone cardiac catheterization on multiple occasions, initially in June 2016 showing 50% LAD stenosis with normal FFR.  He had worsening angina April 2017 with repeat diagnostic catheterization showing a 30% mid LAD stenosis and otherwise nonobstructive disease.  In 2018, he had a syncopal spell of unclear etiology.  Echo showed normal LV function.  Event monitoring was insignificant and episodes were felt to be noncardiac in origin.  Most recent diagnostic catheterization was  performed in August 2022 showing mild nonobstructive one-vessel CAD with 30% stenosis in the mid LAD.  EF was normal with mildly elevated LVEDP.  Physiologic testing within the Cath Lab and FFR of the LAD was 0.87 with a CFR 1.8 and IMR of 22 indicating normal microvascular function.  He was subsequently diagnosed with mitochondrial myopathy with significant muscle weakness and atrophy as result and has been followed at [facility].  Most recent echo in January 2024 showed EF of 55 6% with mild LVH, grade 1 diastolic dysfunction, and aortic sclerosis.  In May 2025, Mr. Hanf reported return of  exertional chest pain and heaviness.  Coronary CT angiogram was performed showing a calcium  score 1111 (99th percentile), and somewhat diffuse 25 to 49% stenoses without any significant stenosis.  The pulmonary artery is mildly dilated.     Mr. Katha was last seen in cardiology clinic in July 2025, at which time he reported doing well without any recurrence of chest pain.  Over the past several months, he is generally doing well.  Some degree of chronic dyspnea on exertion, which has been stable.  He has chronic weakness in the setting of his mitochondrial disorder and has worked with physical therapy recently.  He denies chest pain, palpitations, PND, orthopnea, dizziness, syncope, edema, or early satiety.   Objective   Home Medications    Current Outpatient Medications  Medication Sig Dispense Refill   Accu-Chek Softclix Lancets lancets 1 each by Other route 2 (two) times daily. use for testing 100 each 3   Alpha Lipoic Acid 200 MG CAPS Take 200 mg by mouth daily.     aspirin  EC 81 MG tablet Take by mouth.     blood glucose meter kit and supplies 1 each by Other route in the morning and at bedtime. Dispense based on patient and insurance preference. Use up to two  times daily. (FOR ICD-10 E10.9, E11.9). 1 each 0   Blood Glucose Monitoring Suppl DEVI 1 each by Does not apply route in the morning, at noon, and at bedtime. May substitute to any manufacturer covered by patient's insurance. 1 each 0   carvedilol  (COREG ) 6.25 MG tablet Take 1 tablet (6.25 mg total) by mouth 2 (two) times daily with a meal. 180 tablet 1   Coenzyme Q10 (COQ-10) 100 MG CAPS Take 100 mg by mouth daily.     ezetimibe  (ZETIA ) 10 MG tablet TAKE 1 TABLET(10 MG) BY MOUTH DAILY 90 tablet 0   fluticasone  (FLONASE ) 50 MCG/ACT nasal spray INSTILL 1 SPRAY INTO BOTH NOSTRILS DAILY AS NEEDED 48 g 0   fluticasone  furoate-vilanterol (BREO ELLIPTA ) 100-25 MCG/ACT AEPB INHALE 1 PUFF INTO THE LUNGS DAILY 60 each 6   Glucose Blood  (BLOOD GLUCOSE TEST STRIPS) STRP USE TO TEST BLOOD SUGAR THREE TIMES DAILY AS NEEDED. Per patient Accu-Check is preferred May substitute to any manufacturer covered by patient's insurance. 100 strip 3   hydrocortisone  2.5 % cream Apply to hyperpigmentation on face QD on Monday, Wednesday, and Friday PRN. 28 g 5   insulin  glargine (LANTUS  SOLOSTAR) 100 UNIT/ML Solostar Pen Inject 5-10 Units into the skin daily. 15 mL PRN   Insulin  Pen Needle 32G X 6 MM MISC 1 each by Does not apply route daily at 12 noon. 100 each 0   ketoconazole  (NIZORAL ) 2 % shampoo Wash scalp/body/face QD on Tuesday, Thursday, and Saturday, let sit 5 minutes before rinsing off, avoid eyes. 120 mL 11   linaclotide  (LINZESS ) 145 MCG CAPS capsule Take  1 capsule (145 mcg total) by mouth daily before breakfast. 90 capsule 0   losartan -hydrochlorothiazide  (HYZAAR) 50-12.5 MG tablet Take 1 tablet by mouth daily. 90 tablet 1   meloxicam (MOBIC) 7.5 MG tablet Take 7.5 mg by mouth daily.     nystatin  (MYCOSTATIN ) 100000 UNIT/ML suspension Take 5 mLs (500,000 Units total) by mouth 4 (four) times daily. 473 mL 1   ranolazine  (RANEXA ) 1000 MG SR tablet TAKE 1 TABLET(1000 MG) BY MOUTH TWICE DAILY 180 tablet 3   Riboflavin (B-2) 100 MG TABS Take 100 mg by mouth daily.     rosuvastatin  (CRESTOR ) 10 MG tablet Take 1 tablet (10 mg total) by mouth daily. 90 tablet 1   sildenafil  (VIAGRA ) 100 MG tablet TAKE 1 TABLET BY MOUTH DAILY AS NEEDED FOR ERECTILE DYSFUNCTION 30 tablet 0   Sulfacetamide  Sodium 9.8 % SHAM Shampoo into the face and trunk QD on Monday, Wednesday, and Friday. Let sit a few minutes before washing off. 120 mL 11   temazepam  (RESTORIL ) 15 MG capsule Take 1 capsule (15 mg total) by mouth at bedtime. 90 capsule 0   tirzepatide  (MOUNJARO ) 7.5 MG/0.5ML Pen Inject 7.5 mg into the skin once a week. 6 mL 0   Vitamin D , Ergocalciferol , (DRISDOL ) 1.25 MG (50000 UNIT) CAPS capsule Take 1 capsule (50,000 Units total) by mouth every 7 (seven)  days. 12 capsule 1   nitroGLYCERIN  (NITROSTAT ) 0.4 MG SL tablet Place 1 tablet (0.4 mg total) under the tongue every 5 (five) minutes as needed for chest pain. 25 tablet 1   omeprazole  (PRILOSEC) 40 MG capsule Take 1 capsule (40 mg total) by mouth 2 (two) times daily before a meal. (Patient not taking: Reported on 10/10/2024) 180 capsule 0   No current facility-administered medications for this visit.     Physical Exam    VS:  BP 136/78 (BP Location: Left Arm, Patient Position: Sitting)   Pulse 63   Ht 5' 11 (1.803 m)   Wt 241 lb 12.8 oz (109.7 kg)   SpO2 95%   BMI 33.72 kg/m  , BMI Body mass index is 33.72 kg/m.          GEN: Well nourished, well developed, in no acute distress. HEENT: normal. Neck: Supple, no JVD, carotid bruits, or masses. Cardiac: RRR, no murmurs, rubs, or gallops. No clubbing, cyanosis, edema.  Radials 2+/PT 2+ and equal bilaterally.  Respiratory:  Respirations regular and unlabored, clear to auscultation bilaterally. GI: Soft, nontender, nondistended, BS + x 4. MS: no deformity or atrophy. Skin: warm and dry, no rash. Neuro:  Strength and sensation are intact. Psych: Normal affect.  Accessory Clinical Findings     Lab Results  Component Value Date   WBC 8.1 05/16/2024   HGB 16.8 05/16/2024   HCT 49.6 05/16/2024   MCV 95.2 05/16/2024   PLT 224 05/16/2024   Lab Results  Component Value Date   CREATININE 1.09 05/16/2024   BUN 9 05/16/2024   NA 140 05/16/2024   K 3.4 (L) 05/16/2024   CL 101 05/16/2024   CO2 28 05/16/2024   Lab Results  Component Value Date   ALT 25 05/16/2024   AST 16 05/16/2024   ALKPHOS 61 02/28/2021   BILITOT 0.7 05/16/2024   Lab Results  Component Value Date   CHOL 131 05/16/2024   HDL 49 05/16/2024   LDLCALC 62 05/16/2024   TRIG 122 05/16/2024   CHOLHDL 2.7 05/16/2024    Lab Results  Component Value Date  HGBA1C 5.7 (A) 09/12/2024   Lab Results  Component Value Date   TSH 0.90 10/07/2021        Assessment & Plan    1.  Precordial pain/CAD: Patient with a history of moderate, nonobstructive LAD disease with 4 catheterizations in the past, most recently in 2022, at which time physiologic testing showed normal microvascular function.  Earlier this year, he underwent coronary CT angiogram, which again showed moderate, nonobstructive disease.  He says he has been doing well since his last visit without any chest pain.  He has chronic, stable dyspnea on exertion and he recently tolerated physical therapy reasonably well.  He remains on aspirin , beta-blocker, statin, Zetia , and Ranexa .  2.  Primary hypertension: Blood pressure stable today on beta-blocker, ARB, and diuretic.  3.  Hyperlipidemia: LDL of 62 in May of this year.  Normal LFTs earlier this year.  Remains on statin and Zetia  therapy.  4.  Type 2 diabetes mellitus: A1c 5.7 in September, down from 8.2 earlier this year.  He is on Lantus , Mounjaro , ARB, and statin therapy.  5.  Obstructive sleep apnea: Compliant with CPAP.  6.  Mitochondrial myopathy: Followed at [facility].  7.  Disposition: Follow-up in 6 months or sooner if necessary.  Lonni Meager, NP 10/10/2024, 1:16 PM

## 2024-10-10 NOTE — Patient Instructions (Signed)
 Medication Instructions:  No changes *If you need a refill on your cardiac medications before your next appointment, please call your pharmacy*  Lab Work: None ordered If you have labs (blood work) drawn today and your tests are completely normal, you will receive your results only by: MyChart Message (if you have MyChart) OR A paper copy in the mail If you have any lab test that is abnormal or we need to change your treatment, we will call you to review the results.  Testing/Procedures: None ordered  Follow-Up: At [facility], you and your health needs are our priority.  As part of our continuing mission to provide you with exceptional heart care, our providers are all part of one team.  This team includes your primary Cardiologist (physician) and Advanced Practice Providers or APPs (Physician Assistants and Nurse Practitioners) who all work together to provide you with the care you need, when you need it.  Your next appointment:   6 month(s)  Provider:   You may see Deatrice Cage, MD or one of the following Advanced Practice Providers on your designated Care Team:   Lonni Meager, NP  We recommend signing up for the patient portal called MyChart.  Sign up information is provided on this After Visit Summary.  MyChart is used to connect with patients for Virtual Visits (Telemedicine).  Patients are able to view lab/test results, encounter notes, upcoming appointments, etc.  Non-urgent messages can be sent to your provider as well.   To learn more about what you can do with MyChart, go to [email or web address].

## 2024-10-11 ENCOUNTER — Encounter: Payer: Self-pay | Admitting: Family Medicine

## 2024-10-12 ENCOUNTER — Telehealth: Payer: Self-pay | Admitting: Cardiovascular Disease

## 2024-10-12 NOTE — Telephone Encounter (Signed)
 Pt says he is returning nurses call. Please advise.

## 2024-10-12 NOTE — Telephone Encounter (Signed)
 Returned call to patient; he stated that he realized the call was to schedule an appointment, which was completed yesterday.

## 2024-10-19 ENCOUNTER — Ambulatory Visit

## 2024-10-20 ENCOUNTER — Telehealth: Payer: Self-pay

## 2024-10-20 ENCOUNTER — Ambulatory Visit

## 2024-10-20 DIAGNOSIS — I152 Hypertension secondary to endocrine disorders: Secondary | ICD-10-CM

## 2024-10-20 NOTE — Progress Notes (Signed)
 Complex Care Management Note  Care Guide Note 10/20/2024 Name: [name] [name] MRN: [id number] DOB: Jan 07, 1961  [name] [name] is a 63 y.o. year old male who sees Sowles, Krichna, MD for primary care. I reached out to Orval LELON Bloodgood by phone today to offer complex care management services.  Mr. Hupfer was given information about Complex Care Management services today including:   The Complex Care Management services include support from the care team which includes your Nurse Care Manager, Clinical Social Worker, or Pharmacist.  The Complex Care Management team is here to help remove barriers to the health concerns and goals most important to you. Complex Care Management services are voluntary, and the patient may decline or stop services at any time by request to their care team member.   Complex Care Management Consent Status: Patient agreed to services and verbal consent obtained.   Follow up plan:  Telephone appointment with complex care management team member scheduled for:  11/06/24 at 11:00 a.m.   Encounter Outcome:  Patient Scheduled  Dreama [facility]  [facility], [facility] VBCI Assistant Direct Dial: [phone]  Fax: [phone]

## 2024-10-25 ENCOUNTER — Ambulatory Visit

## 2024-10-25 DIAGNOSIS — E538 Deficiency of other specified B group vitamins: Secondary | ICD-10-CM | POA: Diagnosis not present

## 2024-10-25 MED ORDER — CYANOCOBALAMIN 1000 MCG/ML IJ SOLN
1000.0000 ug | Freq: Once | INTRAMUSCULAR | Status: AC
  Administered 2024-10-25: 1000 ug via INTRAMUSCULAR

## 2024-10-30 ENCOUNTER — Encounter: Payer: Self-pay | Admitting: Gastroenterology

## 2024-11-06 ENCOUNTER — Other Ambulatory Visit: Payer: Self-pay

## 2024-11-06 NOTE — Patient Outreach (Signed)
 Complex Care Management   Visit Note  11/06/2024  Name:  [name] [name] MRN: [id number] DOB: February 28, 1961  Situation: Referral received for Complex Care Management related to Falls/Fatigue/Pain secondary to Mitochondrial Myopathy I obtained verbal consent from Patient.  Visit completed with Patient  on the phone  Background:   Past Medical History:  Diagnosis Date   Allergy    Angina, class III    Arthritis 2010   CAD (coronary artery disease)    a. 05/30/2015 cath: mLAD 50% (FFR 0.83)->Med Rx; b. 03/2016 Cath: LAD 12m-->Med Rx; c. 07/2020 Cath: LAD 72m; d. 07/2021 Cath: LM nl, LAD 10ost/m, 70m (nl FFR), D1/D2/D3 min irregs, LCX min irregs, OM1/2/3 nl, RCA min irregs, RPDA/RPAV/RPL1/RPL2 nl; e. 05/2024 Cor CTA: Ca2+ = 1111 (99th%'ile). Diffuse 25-49% stenoses. Total plaque vol 861m^3 (76th %'ile).   Chronic constipation    Degeneration of intervertebral disc of cervical region    Diastolic dysfunction    a.) TTE 07/22/2017: EF 60-65%, G1DD; b.) TTE 02/14/2021: EF 60-65%, G1DD; c.) TTE 01/19/2023: EF 55-60%, AoV sclerosis without stenosis, G1DD   Dyspnea    Erectile dysfunction    a.) on PDE5i (sildenafil )   GERD (gastroesophageal reflux disease)    Hernia 1991   Hyperlipidemia    Hypertension 2008   Mitochondrial myopathy    a.) followed by medical genetics specialist (Dr. Vaughan Neighbours, MD) at [facility]   Nerve root pain    Neuropathy    Obesity, unspecified 2012   OSA on CPAP    Personal history of tobacco use, presenting hazards to health 2012   Polycythemia    Rectal bleeding 02/11/2017   Recurrent Right Inguinal Hernia Repair 02/09/2011, 04/07/2013   Seizure (HCC)    pt states neurologist thinking he was having seizures while sleeping-eeg done 10-2022 and was normal   Sleep apnea    Sleep difficulties    a.) on BZO (temazepam ) PRN   T2DM (type 2 diabetes mellitus) (HCC)    Umbilical hernia without mention of obstruction or gangrene 02/09/2011   Vitamin D  deficiency      Assessment: Patient Reported Symptoms:  Cognitive Cognitive Status: Alert and oriented to person, place, and time, Insightful and able to interpret abstract concepts, Normal speech and language skills Cognitive/Intellectual Conditions Management [RPT]: Other Other: CTE - concussions, patient denies memory loss   Health Maintenance Behaviors: Annual physical exam, Exercise, Healthy diet, Social activities, Spiritual practice(s) Healing Pattern: Slow Health Facilitated by: Healthy diet  Neurological Neurological Review of Symptoms: Dizziness, Numbness, Headaches, Weakness Neurological Management Strategies: Routine screening, Medication therapy Neurological Comment: Seizures - negative - brain telling muscles to do things and they cannot, dizziness left side tingling in hand, mitochondiral disease, MRI 12/06/24  HEENT HEENT Symptoms Reported: Tinnitus HEENT Comment: seasonal allergies, whisteling in ears at times    Cardiovascular Cardiovascular Symptoms Reported: No symptoms reported Does patient have uncontrolled Hypertension?: No Cardiovascular Management Strategies: Medication therapy, Routine screening, Diet modification, Activity, Exercise Cardiovascular Comment: no nitro for almost a year - Ranexa  BP 118/79  Respiratory Respiratory Symptoms Reported: Shortness of breath Other Respiratory Symptoms: DOE - practices energy conservation Respiratory Management Strategies: BiPAP, Breathing exercise, Medication therapy, Routine screening  Endocrine Is patient diabetic?: Yes Is patient checking blood sugars at home?: Yes List most recent blood sugar readings, include date and time of day: FBG 118 ranges 101-141 no lows/highs Endocrine Self-Management Outcome: 4 (good) Endocrine Comment: diabetic diet - heart healthy  Gastrointestinal Gastrointestinal Symptoms Reported: No symptoms reported Additional Gastrointestinal Details: Linzess   helps, eats a lot of fiber Gastrointestinal  Management Strategies: Medication therapy    Genitourinary Genitourinary Symptoms Reported: No symptoms reported    Integumentary Additional Integumentary Details: dry skin in winter gets like a rash, mult washes, cream for skin for maintenance    Musculoskeletal Musculoskelatal Symptoms Reviewed: Back pain, Joint pain, Muscle pain, Difficulty walking, Limited mobility, Unsteady gait, Weakness Additional Musculoskeletal Details: mitochondreal myopathy with cold weather pain is 8/10, usually 6/10 iff too active next day cannot do anything, usually does energy conservation - discussed, rearranged home for satey - many places to sit uses cane in home, elevated walker outside of house, no recent falls, left knee brace after fall with injury - needs replacement Musculoskeletal Management Strategies: Activity, Adequate rest, Exercise, Routine screening Musculoskeletal Self-Management Outcome: 3 (uncertain) Falls in the past year?: Yes Number of falls in past year: 2 or more Was there an injury with Fall?: Yes (knee injury) Fall Risk Category Calculator: 3 Patient Fall Risk Level: High Fall Risk Patient at Risk for Falls Due to: History of fall(s), Impaired balance/gait, Impaired mobility Fall risk Follow up: Falls evaluation completed, Education provided, Falls prevention discussed  Psychosocial Psychosocial Symptoms Reported: No symptoms reported Additional Psychological Details: blessed strong support of family and fiance Behavioral Management Strategies: Support system, Activity Major Change/Loss/Stressor/Fears (CP): Death of a loved one, Medical condition, self Behaviors When Feeling Stressed/Fearful: loss of nephew in car accident recently Techniques to Cope with Loss/Stress/Change: Diversional activities, Exercise Quality of Family Relationships: helpful, supportive    11/06/2024    PHQ2-9 Depression Screening   Little interest or pleasure in doing things Not at all  Feeling down,  depressed, or hopeless Not at all  PHQ-2 - Total Score 0  Trouble falling or staying asleep, or sleeping too much    Feeling tired or having little energy    Poor appetite or overeating     Feeling bad about yourself - or that you are a failure or have let yourself or your family down    Trouble concentrating on things, such as reading the newspaper or watching television    Moving or speaking so slowly that other people could have noticed.  Or the opposite - being so fidgety or restless that you have been moving around a lot more than usual    Thoughts that you would be better off dead, or hurting yourself in some way    PHQ2-9 Total Score    If you checked off any problems, how difficult have these problems made it for you to do your work, take care of things at home, or get along with other people    Depression Interventions/Treatment      Vitals:   11/06/24 1138  BP: 118/79    Medications Reviewed Today     Reviewed by Devra Lands, RN (Registered Nurse) on 11/06/24 at 1119  Med List Status: <None>   Medication Order Taking? Sig Documenting Provider Last Dose Status Informant  Accu-Chek Softclix Lancets lancets [id number] Yes 1 each by Other route 2 (two) times daily. use for testing Sowles, Krichna, MD  Active   Alpha Lipoic Acid 200 MG CAPS [id number] Yes Take 200 mg by mouth daily. [provider]  Active   aspirin  EC 81 MG tablet [id number] Yes Take by mouth. [provider]  Active   blood glucose meter kit and supplies [id number] Yes 1 each by Other route in the morning and at bedtime. Dispense based on patient and insurance preference. Use up  to two  times daily. (FOR ICD-10 E10.9, E11.9). Sowles, Krichna, MD  Active Self  Blood Glucose Monitoring Suppl DEVI [id number] Yes 1 each by Does not apply route in the morning, at noon, and at bedtime. May substitute to any manufacturer covered by patient's insurance. Sowles, Krichna, MD  Active   carvedilol  (COREG )  6.25 MG tablet [id number] Yes Take 1 tablet (6.25 mg total) by mouth 2 (two) times daily with a meal. Glenard Mire, MD  Active   Coenzyme Q10 (COQ-10) 100 MG CAPS [id number] Yes Take 100 mg by mouth daily. [provider]  Active   ezetimibe  (ZETIA ) 10 MG tablet [id number] Yes TAKE 1 TABLET(10 MG) BY MOUTH DAILY Sowles, Krichna, MD  Active   fluticasone  (FLONASE ) 50 MCG/ACT nasal spray [id number] Yes INSTILL 1 SPRAY INTO BOTH NOSTRILS DAILY AS NEEDED Sowles, Krichna, MD  Active   fluticasone  furoate-vilanterol (BREO ELLIPTA ) 100-25 MCG/ACT AEPB [id number] Yes INHALE 1 PUFF INTO THE LUNGS DAILY Tamea Dedra CROME, MD  Active   Glucose Blood (BLOOD GLUCOSE TEST STRIPS) STRP [id number] Yes USE TO TEST BLOOD SUGAR THREE TIMES DAILY AS NEEDED. Per patient Accu-Check is preferred May substitute to any manufacturer covered by patient's insurance. Sowles, Krichna, MD  Active   hydrocortisone  2.5 % cream [id number] Yes Apply to hyperpigmentation on face QD on Monday, Wednesday, and Friday PRN. Hester Alm BROCKS, MD  Active   insulin  glargine (LANTUS  SOLOSTAR) 100 UNIT/ML Solostar Pen [id number] Yes Inject 5-10 Units into the skin daily.  Patient taking differently: Inject 5-10 Units into the skin daily. Not taking daily - uses when taking steroid shots with elevated BG   Sowles, Krichna, MD  Active   Insulin  Pen Needle 32G X 6 MM MISC [id number] Yes 1 each by Does not apply route daily at 12 noon. Sowles, Krichna, MD  Active   ketoconazole  (NIZORAL ) 2 % shampoo [id number] Yes Wash scalp/body/face QD on Tuesday, Thursday, and Saturday, let sit 5 minutes before rinsing off, avoid eyes. Hester Alm BROCKS, MD  Active   linaclotide  (LINZESS ) 145 MCG CAPS capsule [id number] Yes Take 1 capsule (145 mcg total) by mouth daily before breakfast. Vanga, Rohini Reddy, MD  Active   losartan -hydrochlorothiazide  (HYZAAR) 50-12.5 MG tablet [id number] Yes Take 1 tablet by mouth daily. Sowles, Krichna, MD  Active   meloxicam  [facility]) 7.5 MG tablet [id number] Yes Take 7.5 mg by mouth daily. [provider]  Active   nitroGLYCERIN  (NITROSTAT ) 0.4 MG SL tablet [id number] Yes Place 1 tablet (0.4 mg total) under the tongue every 5 (five) minutes as needed for chest pain. Vivienne Lonni Ingle, NP  Active   nystatin  (MYCOSTATIN ) 100000 UNIT/ML suspension [id number]  Take 5 mLs (500,000 Units total) by mouth 4 (four) times daily.  Patient not taking: Reported on 11/06/2024   Sowles, Krichna, MD  Active   omeprazole  (PRILOSEC) 40 MG capsule [id number]  Take 1 capsule (40 mg total) by mouth 2 (two) times daily before a meal.  Patient not taking: Reported on 10/10/2024   Unk Corinn Skiff, MD  Expired 07/31/24 2359   ranolazine  (RANEXA ) 1000 MG SR tablet [id number] Yes TAKE 1 TABLET(1000 MG) BY MOUTH TWICE DAILY Darron Deatrice LABOR, MD  Active   Riboflavin (B-2) 100 MG TABS [id number] Yes Take 100 mg by mouth daily. [provider]  Active   rosuvastatin  (CRESTOR ) 10 MG tablet [id number] Yes Take 1 tablet (10 mg total) by mouth daily. Sowles, Krichna, MD  Active   sildenafil  (VIAGRA ) 100 MG tablet [id number]  Yes TAKE 1 TABLET BY MOUTH DAILY AS NEEDED FOR ERECTILE DYSFUNCTION Sowles, Krichna, MD  Active   Sulfacetamide  Sodium 9.8 % SHAM [id number] Yes Shampoo into the face and trunk QD on Monday, Wednesday, and Friday. Let sit a few minutes before washing off. Hester Alm BROCKS, MD  Active   temazepam  (RESTORIL ) 15 MG capsule [id number] Yes Take 1 capsule (15 mg total) by mouth at bedtime.  Patient taking differently: Take 15 mg by mouth at bedtime. As needed   Sowles, Krichna, MD  Active   tirzepatide  (MOUNJARO ) 7.5 MG/0.5ML Pen [id number] Yes Inject 7.5 mg into the skin once a week. Sowles, Krichna, MD  Active   Vitamin D , Ergocalciferol , (DRISDOL ) 1.25 MG (50000 UNIT) CAPS capsule [id number] Yes Take 1 capsule (50,000 Units total) by mouth every 7 (seven) days. Sowles, Krichna, MD  Active   Med List Note (Horton,  Jeannetta, CPhT 07/23/21 1212): Cpap            Recommendation:   PCP Follow-up Continue Current Plan of Care BSW appointment 11/08/2024 at 10:00 am  Follow Up Plan:   Telephone follow-up 2 Phynix Horton  Nestora Duos, MSN, RN [facility], [facility] RN Care Manager Direct Dial: [phone] Fax: [phone]

## 2024-11-06 NOTE — Patient Instructions (Signed)
 Visit Information  [name] [name] was given information about Medicaid Managed Care team care coordination services as a part of their Healthy [facility] Medicaid benefit. [place] [employer]   If you would like to schedule transportation through your Healthy [facility] plan, please call the following number at least 2 days in advance of your appointment: [phone]  For information about your ride after you set it up, call Ride Assist at [phone]. Use this number to activate a Will Call pickup, or if your transportation is late for a scheduled pickup. Use this number, too, if you need to make a change or cancel a previously scheduled reservation.  If you need transportation services right away, call [phone]. The after-hours call center is staffed 24 hours to handle ride assistance and urgent reservation requests (including discharges) 365 days a year. Urgent trips include sick visits, hospital discharge requests and life-sustaining treatment.  Call the [facility] Line at [phone], at any time, 24 hours a day, 7 days a week. If you are in danger or need immediate medical attention call 911.   Please see education materials related to Falls, Energy Conservation, Pain provided by MyChart link.  Care plan and visit instructions communicated with the patient verbally today. Patient agrees to receive a copy in MyChart. Active MyChart status and patient understanding of how to access instructions and care plan via MyChart confirmed with patient.     Telephone follow up appointment with Managed Medicaid care management team member scheduled for: 11/21/2024 at 3:30 pm.   Nestora Duos, MSN, RN [facility]  [facility], [facility] RN Care Manager Direct Dial: [phone] Fax: [phone]   Fall Prevention in the Home, Adult Falls can cause injuries and affect people of all ages. There are many simple things that you can do to make your home safe and to  help prevent falls. If you need it, ask for help making these changes. What actions can I take to prevent falls? General information Use good lighting in all rooms. Make sure to: Replace any light bulbs that burn out. Turn on lights if it is dark and use night-lights. Keep items that you use often in easy-to-reach places. Lower the shelves around your home if needed. Move furniture so that there are clear paths around it. Do not keep throw rugs or other things on the floor that can make you trip. If any of your floors are uneven, fix them. Add color or contrast paint or tape to clearly mark and help you see: Grab bars or handrails. First and last steps of staircases. Where the edge of each step is. If you use a ladder or stepladder: Make sure that it is fully opened. Do not climb a closed ladder. Make sure the sides of the ladder are locked in place. Have someone hold the ladder while you use it. Know where your pets are as you move through your home. What can I do in the bathroom?     Keep the floor dry. Clean up any water  that is on the floor right away. Remove soap buildup in the bathtub or shower. Buildup makes bathtubs and showers slippery. Use non-skid mats or decals on the floor of the bathtub or shower. Attach bath mats securely with double-sided, non-slip rug tape. If you need to sit down while you are in the shower, use a non-slip stool. Install grab bars by the toilet and in the bathtub and shower. Do not use towel bars as grab bars. What  can I do in the bedroom? Make sure that you have a light by your bed that is easy to reach. Do not use any sheets or blankets on your bed that hang to the floor. Have a firm bench or chair with side arms that you can use for support when you get dressed. What can I do in the kitchen? Clean up any spills right away. If you need to reach something above you, use a sturdy step stool that has a grab bar. Keep electrical cables out of the  way. Do not use floor polish or wax that makes floors slippery. What can I do with my stairs? Do not leave anything on the stairs. Make sure that you have a light switch at the top and the bottom of the stairs. Have them installed if you do not have them. Make sure that there are handrails on both sides of the stairs. Fix handrails that are broken or loose. Make sure that handrails are as long as the staircases. Install non-slip stair treads on all stairs in your home if they do not have carpet. Avoid having throw rugs at the top or bottom of stairs, or secure the rugs with carpet tape to prevent them from moving. Choose a carpet design that does not hide the edge of steps on the stairs. Make sure that carpet is firmly attached to the stairs. Fix any carpet that is loose or worn. What can I do on the outside of my home? Use bright outdoor lighting. Repair the edges of walkways and driveways and fix any cracks. Clear paths of anything that can make you trip, such as tools or rocks. Add color or contrast paint or tape to clearly mark and help you see high doorway thresholds. Trim any bushes or trees on the main path into your home. Check that handrails are securely fastened and in good repair. Both sides of all steps should have handrails. Install guardrails along the edges of any raised decks or porches. Have leaves, snow, and ice cleared regularly. Use sand, salt, or ice melt on walkways during winter months if you live where there is ice and snow. In the garage, clean up any spills right away, including grease or oil spills. What other actions can I take? Review your medicines with your health care provider. Some medicines can make you confused or feel dizzy. This can increase your chance of falling. Wear closed-toe shoes that fit well and support your feet. Wear shoes that have rubber soles and low heels. Use a cane, walker, scooter, or crutches that help you move around if needed. Talk with  your provider about other ways that you can decrease your risk of falls. This may include seeing a physical therapist to learn to do exercises to improve movement and strength. Where to find more information Centers for Disease Control and Prevention, [employer]: tonerpromos.no [employer] on Aging: baseringtones.pl National Institute on Aging: baseringtones.pl Contact a health care provider if: You are afraid of falling at home. You feel weak, drowsy, or dizzy at home. You fall at home. Get help right away if you: Lose consciousness or have trouble moving after a fall. Have a fall that causes a head injury. These symptoms may be an emergency. Get help right away. Call 911. Do not wait to see if the symptoms will go away. Do not drive yourself to the hospital. This information is not intended to replace advice given to you by your health care provider. Make sure you  discuss any questions you have with your health care provider. Document Revised: 08/17/2022 Document Reviewed: 08/17/2022 Elsevier Patient Education  2024 Elsevier Inc.   Chronic Pain, Adult Chronic pain is a type of pain that lasts or keeps coming back for at least 3-6 months. You may have headaches, pain in the abdomen, or pain in other areas of the body. Chronic pain may be related to an illness, injury, or a health condition. Sometimes, the cause of chronic pain is not known. Chronic pain can make it hard for you to do daily activities. If it is not treated, chronic pain can lead to anxiety and depression. Treatment depends on the cause of your pain and how severe it is. You may need to work with a pain specialist to come up with a treatment plan. Many people benefit from two or more types of treatment to control their pain. Follow these instructions at home: Treatment plan Follow your treatment plan as told by your health care provider. This may include: Gentle, regular exercise. Eating a healthy diet that includes foods such as  vegetables, fruits, fish, and lean meats. Mental health therapy (cognitive or behavioral therapy) that changes the way you think or act in response to the pain. This may help improve how you feel. Doing physical therapy exercises to improve movement and strength. Meditation, yoga, acupuncture, or massage therapy. Using the oils from plants in your environment or on your skin (aromatherapy). Other treatments may include: Over-the-counter or prescription medicines. Color, light, or sound therapy. Local electrical stimulation. The electrical pulses help to relieve pain by temporarily stopping the nerve impulses that cause you to feel pain. Injections. These deliver numbing or pain-relieving medicines into the spine or the area of pain.  Medicines Take over-the-counter and prescription medicines only as told by your health care provider. Ask your health care provider if the medicine prescribed to you: Requires you to avoid driving or using machinery. Can cause constipation. You may need to take these actions to prevent or treat constipation: Drink enough fluid to keep your urine pale yellow. Take over-the-counter or prescription medicines. Eat foods that are high in fiber, such as beans, whole grains, and fresh fruits and vegetables. Limit foods that are high in fat and processed sugars, such as fried or sweet foods. Lifestyle  Ask your health care provider whether you should keep a pain diary. Your health care provider will tell you what information to write in the diary. This may include: When you have pain. What the pain feels like. How medicines and other behaviors or treatments help to reduce the pain. Consider talking with a mental health care provider about how to help manage chronic pain. Consider joining a chronic pain support group. Try to control or lower your stress levels. Talk with your health care provider about ways to do this. General instructions Learn as much as you can  about how to manage your chronic pain. Ask your health care provider if an intensive pain rehabilitation program or a chronic pain specialist would be helpful. Check your pain level as told by your health care provider. Ask your health care provider if you should use a pain scale. Contact a health care provider if: Your pain is not controlled with treatment. You have new pain. You have side effects from pain medicine. You feel weak or you have trouble doing your normal activities. You have trouble sleeping or you develop confusion. You lose feeling or have numbness in your body. You lose control of  your bowels or bladder. Get help right away if: Your pain suddenly gets much worse. You develop chest pain. You have trouble breathing or shortness of breath. You faint, or another person sees you faint. These symptoms may be an emergency. Get help right away. Call 911. Do not wait to see if the symptoms will go away. Do not drive yourself to the hospital. Also, get help right away if: You have thoughts about hurting yourself or others. Take one of these steps if you feel like you may hurt yourself or others, or have thoughts about taking your own life: Go to your nearest emergency room. Call 911. Call the National Suicide Prevention Lifeline at 561-315-8578 or 988. This is open 24 hours a day. Text the Crisis Text Line at 517-098-4337. This information is not intended to replace advice given to you by your health care provider. Make sure you discuss any questions you have with your health care provider. Document Revised: 08/05/2022 Document Reviewed: 07/08/2022 Elsevier Patient Education  2024 Elsevier Inc.Energy Conservation Techniques  Sit for as many activities as possible. Use slow, smooth movements.  Rushing increases discomfort. Determine the necessity of performing the task.  Simplify those tasks that are necessary.  (Get clothes out of the dryer when they are warm instead of ironing,  let dishes air dry, etc.) Take frequent rests both during and between activities.  Avoid repetitive tasks. Pre-plan your activities; try a daily and/or weekly schedule.  Spread out the activities that are most fatiguing (break up cleaning tasks over multiple days). Remember to plan a balance of work, rest and recreation. Consider the best time for each activity.  Do the most exertive task when you have the most energy. Don't carry items if you can push them.  Slide, don't lift. Push, don't pull. Utilize two hands when appropriate. Maintain good posture and use proper body mechanics. Avoid remaining in one position for too long. When lifting, bend at the knees, not at the waist.  Exhale when bending down, inhale when straightening up. Carry objects as close to your body and as near to the center of the pelvis.  11. Avoid wasted body movements (position yourself for the task so that you avoid bending, twisting, etc.                when possible). 12. Select the best working environment.  Consider lighting, ventilation, clothing, and equipment. 13. Organize your storage areas, making the items you use daily convenient.  Store heaviest items at waist            height.  Store frequently used items between shoulders and knee height.  Consider leaving frequently used       items on countertops.  (You can organize in storage baskets based on time used/purpose). 14. Feelings and emotions can be real causes of fatigue.  Try to avoid unnecessary worry, irritation, or                    frustration.  Avoid stress, it can also be a source of fatigue. 15. Get help from other people for difficult tasks. 16. Explore equipment or items that may be able to do the job for you with greater ease.  (Electric can        openers, blenders, lightweight items for cleaning, etc.)     Following is a copy of your plan of care:  There are no care plans that you recently modified to display for this patient.

## 2024-11-07 NOTE — Patient Outreach (Signed)
 RNCM - patient left message requesting resources for turkey/Thanksgiving Dinner. RNCM called patient with multiple specific resources with phone numbers/website and advised if no success can call 211. Patient with no further needs at this time.

## 2024-11-08 ENCOUNTER — Other Ambulatory Visit: Payer: Self-pay

## 2024-11-08 NOTE — Patient Outreach (Signed)
 Social Drivers of Health  Community Resource and Care Coordination Visit Note   11/08/2024  Name: [name] [name] MRN: [id number] DOB:10/07/1961  Situation: Referral received for [facility] needs assessment and assistance related to Financial Strain  Food Insecurity  vocational rehab. I obtained verbal consent from Patient.  Visit completed with Patient on the phone.   Background:      Assessment:   Goals Addressed             This Visit's Progress    Patient Stated       Current SDOH Barriers:  Vocational Rehab  Interventions: Referred patient to community resources for Vocational rehab SW emailed and mailed resources for food,rent, utilities, and local vocational rehab.  Thersia Hoar, HEDWIG, MHA [place]  [facility] Social Worker, Population Health [phone]           Recommendation:   Patient will use resources sw mailed and emailed for assistance  Follow Up Plan:   Telephone follow-up 11/28/24 at 11am  Thersia Hoar, BSW, [facility] [place]  [facility] Social Worker, Population Health [phone]

## 2024-11-08 NOTE — Patient Instructions (Signed)
 Visit Information  Mr. Spizzirri was given information about Medicaid Managed Care team care coordination services as a part of their Healthy [facility] Medicaid benefit. [place]   If you would like to schedule transportation through your Healthy [facility] plan, please call the following number at least 2 days in advance of your appointment: [phone]  For information about your ride after you set it up, call Ride Assist at [phone]. Use this number to activate a Will Call pickup, or if your transportation is late for a scheduled pickup. Use this number, too, if you need to make a change or cancel a previously scheduled reservation.  If you need transportation services right away, call [phone]. The after-hours call center is staffed 24 hours to handle ride assistance and urgent reservation requests (including discharges) 365 days a year. Urgent trips include sick visits, hospital discharge requests and life-sustaining treatment.  Call the [facility] Line at [phone], at any time, 24 hours a day, 7 days a week. If you are in danger or need immediate medical attention call 911.     Patient verbalizes understanding of instructions and care plan provided today and agrees to view in MyChart. Active MyChart status and patient understanding of how to access instructions and care plan via MyChart confirmed with patient.     Social Worker will follow up on 11/28/24  Thersia Hoar, BSW, MHA [place]  [facility] Social Worker, Population Health [phone]   Following is a copy of your plan of care:  There are no care plans that you recently modified to display for this patient.

## 2024-11-15 ENCOUNTER — Other Ambulatory Visit: Payer: Self-pay | Admitting: Pulmonary Disease

## 2024-11-19 ENCOUNTER — Encounter: Payer: Self-pay | Admitting: Family Medicine

## 2024-11-20 MED ORDER — FLUTICASONE FUROATE-VILANTEROL 100-25 MCG/ACT IN AEPB: 1.0000 | INHALATION_SPRAY | Freq: Every day | RESPIRATORY_TRACT | 3 refills | Status: AC

## 2024-11-21 ENCOUNTER — Other Ambulatory Visit: Payer: Self-pay

## 2024-11-21 NOTE — Patient Outreach (Signed)
 Complex Care Management   Visit Note  11/21/2024  Name:  [name] [name] MRN: [id number] DOB: 21-Oct-1961  Situation: Referral received for Complex Care Management related to Falls/Fatigue/Pain I obtained verbal consent from Patient.  Visit completed with Patient  on the phone  Background:   Past Medical History:  Diagnosis Date   Allergy    Angina, class III    Arthritis 2010   CAD (coronary artery disease)    a. 05/30/2015 cath: mLAD 50% (FFR 0.83)->Med Rx; b. 03/2016 Cath: LAD 73m-->Med Rx; c. 07/2020 Cath: LAD 77m; d. 07/2021 Cath: LM nl, LAD 10ost/m, 46m (nl FFR), D1/D2/D3 min irregs, LCX min irregs, OM1/2/3 nl, RCA min irregs, RPDA/RPAV/RPL1/RPL2 nl; e. 05/2024 Cor CTA: Ca2+ = 1111 (99th%'ile). Diffuse 25-49% stenoses. Total plaque vol 851m^3 (76th %'ile).   Chronic constipation    Degeneration of intervertebral disc of cervical region    Diastolic dysfunction    a.) TTE 07/22/2017: EF 60-65%, G1DD; b.) TTE 02/14/2021: EF 60-65%, G1DD; c.) TTE 01/19/2023: EF 55-60%, AoV sclerosis without stenosis, G1DD   Dyspnea    Erectile dysfunction    a.) on PDE5i (sildenafil )   GERD (gastroesophageal reflux disease)    Hernia 1991   Hyperlipidemia    Hypertension 2008   Mitochondrial myopathy    a.) followed by medical genetics specialist (Dr. Vaughan Neighbours, MD) at [facility]   Nerve root pain    Neuropathy    Obesity, unspecified 2012   OSA on CPAP    Personal history of tobacco use, presenting hazards to health 2012   Polycythemia    Rectal bleeding 02/11/2017   Recurrent Right Inguinal Hernia Repair 02/09/2011, 04/07/2013   Seizure (HCC)    pt states neurologist thinking he was having seizures while sleeping-eeg done 10-2022 and was normal   Sleep apnea    Sleep difficulties    a.) on BZO (temazepam ) PRN   T2DM (type 2 diabetes mellitus) (HCC)    Umbilical hernia without mention of obstruction or gangrene 02/09/2011   Vitamin D  deficiency     Assessment: Patient Reported  Symptoms:  Cognitive Cognitive Status: Alert and oriented to person, place, and time, Insightful and able to interpret abstract concepts, Normal speech and language skills, Struggling with memory recall (Football CTE - some Short Term Memory Issues)      Neurological Neurological Review of Symptoms: Dizziness, Headaches, Numbness, Weakness Neurological Comment: Weakness and aching with mitochondrial disease - too much activity gets SOB and elevated HR, dizziness and L hand tingeling - [facility] in Jan shots in neck, headaches/migraines at times  HEENT HEENT Symptoms Reported: Tinnitus HEENT Management Strategies: Routine screening HEENT Comment: ENT specialist - calls if gets worse    Cardiovascular Cardiovascular Symptoms Reported: No symptoms reported Does patient have uncontrolled Hypertension?: No Cardiovascular Management Strategies: Medication therapy, Routine screening Cardiovascular Comment: BP today 118/74  Respiratory Respiratory Symptoms Reported: Shortness of breath Respiratory Management Strategies: CPAP, Breathing exercise, Medication therapy, Routine screening  Endocrine Endocrine Symptoms Reported: No symptoms reported Is patient diabetic?: Yes List most recent blood sugar readings, include date and time of day: FBG today 109 no highs/lows Endocrine Self-Management Outcome: 4 (good)  Gastrointestinal Gastrointestinal Symptoms Reported: No symptoms reported Gastrointestinal Management Strategies: Medication therapy    Genitourinary Genitourinary Symptoms Reported: No symptoms reported Genitourinary Comment: sildenfil prn  Integumentary Integumentary Symptoms Reported: No symptoms reported    Musculoskeletal Musculoskelatal Symptoms Reviewed: Back pain, Muscle pain, Weakness, Difficulty walking, Limited mobility Additional Musculoskeletal Details: no new falls, some worsinging balance  issues with mitochondial flare due to weather, generalized weakness and pain 5-6/10 today,  needs knee replacement - will discuss with neuro   Falls in the past year?: Yes Number of falls in past year: 2 or more Was there an injury with Fall?: Yes Fall Risk Category Calculator: 3 Patient Fall Risk Level: High Fall Risk Patient at Risk for Falls Due to: History of fall(s), Impaired balance/gait, Orthopedic patient Fall risk Follow up: Falls prevention discussed  Psychosocial Psychosocial Symptoms Reported: No symptoms reported Additional Psychological Details: happy seeing family for holidays Behavioral Management Strategies: Support system, Activity, Adequate rest, Coping strategies Behavioral Health Self-Management Outcome: 4 (good) Major Change/Loss/Stressor/Fears (CP): Death of a loved one, Medical condition, self      11/21/2024    PHQ2-9 Depression Screening   Little interest or pleasure in doing things Not at all  Feeling down, depressed, or hopeless Not at all  PHQ-2 - Total Score 0  Trouble falling or staying asleep, or sleeping too much    Feeling tired or having little energy    Poor appetite or overeating     Feeling bad about yourself - or that you are a failure or have let yourself or your family down    Trouble concentrating on things, such as reading the newspaper or watching television    Moving or speaking so slowly that other people could have noticed.  Or the opposite - being so fidgety or restless that you have been moving around a lot more than usual    Thoughts that you would be better off dead, or hurting yourself in some way    PHQ2-9 Total Score    If you checked off any problems, how difficult have these problems made it for you to do your work, take care of things at home, or get along with other people    Depression Interventions/Treatment      Today's Vitals   11/21/24 1019  BP: 118/74      Medications Reviewed Today     Reviewed by Devra Lands, RN (Registered Nurse) on 11/21/24 at 1013  Med List Status: <None>   Medication Order  Taking? Sig Documenting Provider Last Dose Status Informant  Accu-Chek Softclix Lancets lancets [id number] Yes 1 each by Other route 2 (two) times daily. use for testing Sowles, Krichna, MD  Active   Alpha Lipoic Acid 200 MG CAPS [id number] Yes Take 200 mg by mouth daily. [provider]  Active   aspirin  EC 81 MG tablet [id number] Yes Take by mouth. [provider]  Active   blood glucose meter kit and supplies [id number] Yes 1 each by Other route in the morning and at bedtime. Dispense based on patient and insurance preference. Use up to two  times daily. (FOR ICD-10 E10.9, E11.9). Sowles, Krichna, MD  Active Self  Blood Glucose Monitoring Suppl DEVI [id number] Yes 1 each by Does not apply route in the morning, at noon, and at bedtime. May substitute to any manufacturer covered by patient's insurance. Sowles, Krichna, MD  Active   carvedilol  (COREG ) 6.25 MG tablet [id number] Yes Take 1 tablet (6.25 mg total) by mouth 2 (two) times daily with a meal. Glenard Mire, MD  Active   Coenzyme Q10 (COQ-10) 100 MG CAPS [id number] Yes Take 100 mg by mouth daily. [provider]  Active   ezetimibe  (ZETIA ) 10 MG tablet [id number] Yes TAKE 1 TABLET(10 MG) BY MOUTH DAILY Sowles, Krichna, MD  Active   fluticasone  (FLONASE ) 50 MCG/ACT nasal spray  [id number] Yes INSTILL 1 SPRAY INTO BOTH NOSTRILS DAILY AS NEEDED Glenard, Krichna, MD  Active   fluticasone  furoate-vilanterol (BREO ELLIPTA ) 100-25 MCG/ACT AEPB [id number] Yes Inhale 1 puff into the lungs daily. Sowles, Krichna, MD  Active   Glucose Blood (BLOOD GLUCOSE TEST STRIPS) STRP [id number] Yes USE TO TEST BLOOD SUGAR THREE TIMES DAILY AS NEEDED. Per patient Accu-Check is preferred May substitute to any manufacturer covered by patient's insurance. Sowles, Krichna, MD  Active   hydrocortisone  2.5 % cream [id number] Yes Apply to hyperpigmentation on face QD on Monday, Wednesday, and Friday PRN. Hester Alm BROCKS, MD  Active   insulin  glargine  (LANTUS  SOLOSTAR) 100 UNIT/ML Solostar Pen [id number] Yes Inject 5-10 Units into the skin daily.  Patient taking differently: Inject 5-10 Units into the skin daily. Not taking daily - uses when taking steroid shots with elevated BG   Sowles, Krichna, MD  Active   Insulin  Pen Needle 32G X 6 MM MISC [id number] Yes 1 each by Does not apply route daily at 12 noon. Sowles, Krichna, MD  Active   ketoconazole  (NIZORAL ) 2 % shampoo [id number] Yes Wash scalp/body/face QD on Tuesday, Thursday, and Saturday, let sit 5 minutes before rinsing off, avoid eyes. Hester Alm BROCKS, MD  Active   linaclotide  (LINZESS ) 145 MCG CAPS capsule [id number] Yes Take 1 capsule (145 mcg total) by mouth daily before breakfast. Vanga, Rohini Reddy, MD  Active   losartan -hydrochlorothiazide  (HYZAAR) 50-12.5 MG tablet [id number] Yes Take 1 tablet by mouth daily. Sowles, Krichna, MD  Active   meloxicam [facility]) 7.5 MG tablet [id number] Yes Take 7.5 mg by mouth daily. [provider]  Active   nitroGLYCERIN  (NITROSTAT ) 0.4 MG SL tablet [id number] Yes Place 1 tablet (0.4 mg total) under the tongue every 5 (five) minutes as needed for chest pain. Vivienne Lonni Ingle, NP  Active   nystatin  (MYCOSTATIN ) 100000 UNIT/ML suspension [id number]  Take 5 mLs (500,000 Units total) by mouth 4 (four) times daily.  Patient not taking: Reported on 11/06/2024   Sowles, Krichna, MD  Active   omeprazole  (PRILOSEC) 40 MG capsule [id number]  Take 1 capsule (40 mg total) by mouth 2 (two) times daily before a meal.  Patient not taking: Reported on 10/10/2024   Unk Corinn Skiff, MD  Expired 07/31/24 2359   ranolazine  (RANEXA ) 1000 MG SR tablet [id number] Yes TAKE 1 TABLET(1000 MG) BY MOUTH TWICE DAILY Darron Deatrice LABOR, MD  Active   Riboflavin (B-2) 100 MG TABS [id number] Yes Take 100 mg by mouth daily. [provider]  Active   rosuvastatin  (CRESTOR ) 10 MG tablet [id number] Yes Take 1 tablet (10 mg total) by mouth daily. Sowles, Krichna, MD   Active   sildenafil  (VIAGRA ) 100 MG tablet [id number] Yes TAKE 1 TABLET BY MOUTH DAILY AS NEEDED FOR ERECTILE DYSFUNCTION Sowles, Krichna, MD  Active   Sulfacetamide  Sodium 9.8 % SHAM [id number] Yes Shampoo into the face and trunk QD on Monday, Wednesday, and Friday. Let sit a few minutes before washing off. Hester Alm BROCKS, MD  Active   temazepam  (RESTORIL ) 15 MG capsule [id number] Yes Take 1 capsule (15 mg total) by mouth at bedtime.  Patient taking differently: Take 15 mg by mouth at bedtime. As needed   Sowles, Krichna, MD  Active   tirzepatide  (MOUNJARO ) 7.5 MG/0.5ML Pen [id number] Yes Inject 7.5 mg into the skin once a week. Sowles, Krichna, MD  Active   Vitamin D , Ergocalciferol , (DRISDOL ) 1.25 MG (50000 UNIT) CAPS capsule [id number] Yes Take 1 capsule (50,000  Units total) by mouth every 7 (seven) days. Sowles, Krichna, MD  Active   Med List Note (Horton, Jeannetta, CPhT 07/23/21 1212): Cpap            Recommendation:   PCP Follow-up Continue Current Plan of Care  Follow Up Plan:   Telephone follow-up in 1 month  Nestora Duos, MSN, RN [facility]  [facility], [facility] RN Care Manager Direct Dial: [phone] Fax: [phone]

## 2024-11-21 NOTE — Patient Instructions (Signed)
 Visit Information  [name] was given information about Medicaid Managed Care team care coordination services as a part of their Healthy [facility] Medicaid benefit. [name]   If you would like to schedule transportation through your Healthy [facility] plan, please call the following number at least 2 days in advance of your appointment: [phone]  For information about your ride after you set it up, call Ride Assist at [phone]. Use this number to activate a Will Call pickup, or if your transportation is late for a scheduled pickup. Use this number, too, if you need to make a change or cancel a previously scheduled reservation.  If you need transportation services right away, call [phone]. The after-hours call center is staffed 24 hours to handle ride assistance and urgent reservation requests (including discharges) 365 days a year. Urgent trips include sick visits, hospital discharge requests and life-sustaining treatment.  Call the [facility] Line at [phone], at any time, 24 hours a day, 7 days a week. If you are in danger or need immediate medical attention call 911.   Please see education materials related to NONE provided by MyChart link.  Care plan and visit instructions communicated with the patient verbally today. Patient agrees to receive a copy in MyChart. Active MyChart status and patient understanding of how to access instructions and care plan via MyChart confirmed with patient.     Telephone follow up appointment with Managed Medicaid care management team member scheduled for: 12/11/2024 a 9:30 am  Nestora Duos, MSN, RN [facility]  [facility], [facility] RN Care Manager Direct Dial: [phone] Fax: [phone]   Following is a copy of your plan of care:  There are no care plans that you recently modified to display for this patient.

## 2024-11-25 DIAGNOSIS — G713 Mitochondrial myopathy, not elsewhere classified: Secondary | ICD-10-CM | POA: Diagnosis not present

## 2024-11-27 ENCOUNTER — Ambulatory Visit (INDEPENDENT_AMBULATORY_CARE_PROVIDER_SITE_OTHER)

## 2024-11-27 DIAGNOSIS — M1712 Unilateral primary osteoarthritis, left knee: Secondary | ICD-10-CM | POA: Diagnosis not present

## 2024-11-27 DIAGNOSIS — E538 Deficiency of other specified B group vitamins: Secondary | ICD-10-CM | POA: Diagnosis not present

## 2024-11-27 MED ORDER — CYANOCOBALAMIN 1000 MCG/ML IJ SOLN
1000.0000 ug | Freq: Once | INTRAMUSCULAR | Status: AC
  Administered 2024-11-27: 1000 ug via INTRAMUSCULAR

## 2024-11-28 ENCOUNTER — Other Ambulatory Visit: Payer: Self-pay

## 2024-11-28 DIAGNOSIS — Z860101 Personal history of adenomatous and serrated colon polyps: Secondary | ICD-10-CM | POA: Diagnosis not present

## 2024-11-28 DIAGNOSIS — K5909 Other constipation: Secondary | ICD-10-CM | POA: Diagnosis not present

## 2024-11-28 NOTE — Patient Instructions (Signed)
 Visit Information  [name] [name] was given information about Medicaid Managed Care team care coordination services as a part of their Healthy [facility] Medicaid benefit. [name] [name]   If you would like to schedule transportation through your Healthy [facility] plan, please call the following number at least 2 days in advance of your appointment: [phone]  For information about your ride after you set it up, call Ride Assist at [phone]. Use this number to activate a Will Call pickup, or if your transportation is late for a scheduled pickup. Use this number, too, if you need to make a change or cancel a previously scheduled reservation.  If you need transportation services right away, call [phone]. The after-hours call center is staffed 24 hours to handle ride assistance and urgent reservation requests (including discharges) 365 days a year. Urgent trips include sick visits, hospital discharge requests and life-sustaining treatment.  Call the [facility] Line at [phone], at any time, 24 hours a day, 7 days a week. If you are in danger or need immediate medical attention call 911.   The Patient has been provided with contact information for the Managed Medicaid care management team and has been advised to call with any health related questions or concerns.   [name] [name], [name] [name], [name] [name]  [facility] Social Worker, Population Health [phone]   Following is a copy of your plan of care:  There are no care plans that you recently modified to display for this patient.

## 2024-11-28 NOTE — Patient Outreach (Signed)
 Social Drivers of Health  Community Resource and Care Coordination Visit Note   11/28/2024  Name: CLOYD RAGAS MRN: [id number] DOB:February 04, 1961  Situation: Referral received for [facility] needs assessment and assistance related to [employer]  vocational rehab. I obtained verbal consent from Patient.  Visit completed with Patient on the phone.   Background:      Assessment:   Goals Addressed             This Visit's Progress    COMPLETED: BSW Goals       Current SDOH Barriers:  Vocational Rehab  Interventions: Referred patient to community resources for Vocational rehab SW emailed and mailed resources for food,rent, utilities, and local vocational rehab.  Thersia Hoar, HEDWIG, MHA [place]  [facility] Social Worker, Population Health [phone]           Recommendation:   Continue working with Vocational rehab and use resources SW provided for assistance with rent.  Follow Up Plan:   Patient has achieved all patient stated goals. [employer] will be closed. Patient has been provided contact information should new needs arise.   Thersia Hoar, HEDWIG, MHA [place]  [facility] Social Worker, Population Health [phone]

## 2024-11-29 ENCOUNTER — Encounter: Payer: Self-pay | Admitting: Gastroenterology

## 2024-12-05 ENCOUNTER — Other Ambulatory Visit: Payer: Self-pay | Admitting: Family Medicine

## 2024-12-05 DIAGNOSIS — N529 Male erectile dysfunction, unspecified: Secondary | ICD-10-CM

## 2024-12-06 NOTE — Telephone Encounter (Signed)
 Second request

## 2024-12-11 ENCOUNTER — Other Ambulatory Visit: Payer: Self-pay

## 2024-12-11 NOTE — Patient Instructions (Signed)
 Visit Information  [name] [name] was given information about Medicaid Managed Care team care coordination services as a part of their Healthy [facility] Medicaid benefit. VICTORY STROLLO   If you would like to schedule transportation through your Healthy [facility] plan, please call the following number at least 2 days in advance of your appointment: [phone]  For information about your ride after you set it up, call Ride Assist at [phone]. Use this number to activate a Will Call pickup, or if your transportation is late for a scheduled pickup. Use this number, too, if you need to make a change or cancel a previously scheduled reservation.  If you need transportation services right away, call [phone]. The after-hours call center is staffed 24 hours to handle ride assistance and urgent reservation requests (including discharges) 365 days a year. Urgent trips include sick visits, hospital discharge requests and life-sustaining treatment.  Call the [facility] Line at [phone], at any time, 24 hours a day, 7 days a week. If you are in danger or need immediate medical attention call 911.   Please see education materials related to NONE provided by MyChart link.  Patient verbalizes understanding of instructions and care plan provided today and agrees to view in MyChart. Active MyChart status and patient understanding of how to access instructions and care plan via MyChart confirmed with patient.     Telephone follow up appointment with Managed Medicaid care management team member scheduled for:  Nestora Duos, MSN, RN [facility]  [facility], [facility] RN Care Manager Direct Dial: [phone] Fax: [phone]   Following is a copy of your plan of care:   Goals Addressed             This Visit's Progress    VBCI RN Care Plan - Falls/Fatigue/Pain Secondary to Mitochondreal Myopathy       Problems:  Chronic Disease Management support and  education needs related to Falls/Fatigue/Pain Secondary to Mitochondrial Myopathy  Goal: Over the next 90 days the Patient will attend all scheduled medical appointments: Patient will attend all appointments as evidenced by chart review        continue to work with RN Care Manager and/or Social Worker to address care management and care coordination needs related to Falls/Fatigue/Pain r/t Mitochondrial Myopathy as evidenced by adherence to care management team scheduled appointments     take all medications exactly as prescribed and will call provider for medication related questions as evidenced by patient report    verbalize basic understanding of Falls/Fatigue/Pain r/t Mitochondrial Myopathy disease process and self health management plan as evidenced by continue to practice fall precautions, use cane walker, energy conservation, exercise regularly as tolerated, diabetic/heart healthy diet, pain control using various methods, report falls/changes/concerns to provider promptly  Interventions:   Falls/Fatigue Interventions: Provided written and verbal education re: potential causes of falls and Fall prevention strategies Reviewed medications and discussed potential side effects of medications such as dizziness and frequent urination Advised patient of importance of notifying provider of falls Assessed for signs and symptoms of orthostatic hypotension - None Assessed for falls since last encounter - None Assessed patients knowledge of fall risk prevention secondary to previously provided education - patient has made adjustments in home, uses cane, standing walker Practice energy conservation, promptly notify provider of change in activity tolerance or symptoms of illness.  Reports using energy conservation techniques Screening for signs and symptoms of depression related to chronic disease state - None Assessed social determinant of health  barriers - BSW referral for information on possible jobs  within constraints of disability and SS/voc rehab. Patient applied for Voc Rehab. Has resources from BSW and from RNCM/[employer] re Asthma. Will call 211 for rental assist due to time constraints, Disability/SS overpaid and took money back so tight for rent this month. Reports no food or housing needs at this time - resolved.    Pain Interventions: Pain assessment performed Medications reviewed Discussed importance of adherence to all scheduled medical appointments Counseled on the importance of reporting any/all new or changed pain symptoms or management strategies to pain management provider Advised patient to report to care team affect of pain on daily activities Discussed use of relaxation techniques and/or diversional activities to assist with pain reduction (distraction, imagery, relaxation, massage, acupressure, TENS, heat, and cold application Screening for signs and symptoms of depression related to chronic disease state  Assessed social determinant of health barriers MRI pending for worsening headaches  Patient Self-Care Activities:  Attend all scheduled provider appointments Call pharmacy for medication refills 3-7 days in advance of running out of medications Call provider office for new concerns or questions  Take medications as prescribed    Plan:  Telephone follow up appointment with care management team member scheduled for:  01/08/2025  at 9:30 am

## 2024-12-11 NOTE — Patient Outreach (Signed)
 Complex Care Management   Visit Note  12/11/2024  Name:  [name] [name] MRN: [id number] DOB: 1961/01/06  Situation: Referral received for Complex Care Management related to Falls/Fatigue I obtained verbal consent from Patient.  Visit completed with Patient  on the phone  Background:   Past Medical History:  Diagnosis Date   Allergy    Angina, class III    Arthritis 2010   CAD (coronary artery disease)    a. 05/30/2015 cath: mLAD 50% (FFR 0.83)->Med Rx; b. 03/2016 Cath: LAD 22m-->Med Rx; c. 07/2020 Cath: LAD 54m; d. 07/2021 Cath: LM nl, LAD 10ost/m, 72m (nl FFR), D1/D2/D3 min irregs, LCX min irregs, OM1/2/3 nl, RCA min irregs, RPDA/RPAV/RPL1/RPL2 nl; e. 05/2024 Cor CTA: Ca2+ = 1111 (99th%'ile). Diffuse 25-49% stenoses. Total plaque vol 840m^3 (76th %'ile).   Chronic constipation    Degeneration of intervertebral disc of cervical region    Diastolic dysfunction    a.) TTE 07/22/2017: EF 60-65%, G1DD; b.) TTE 02/14/2021: EF 60-65%, G1DD; c.) TTE 01/19/2023: EF 55-60%, AoV sclerosis without stenosis, G1DD   Dyspnea    Erectile dysfunction    a.) on PDE5i (sildenafil )   GERD (gastroesophageal reflux disease)    Hernia 1991   Hyperlipidemia    Hypertension 2008   Mitochondrial myopathy    a.) followed by medical genetics specialist (Dr. Vaughan Neighbours, MD) at [facility]   Nerve root pain    Neuropathy    Obesity, unspecified 2012   OSA on CPAP    Personal history of tobacco use, presenting hazards to health 2012   Polycythemia    Rectal bleeding 02/11/2017   Recurrent Right Inguinal Hernia Repair 02/09/2011, 04/07/2013   Seizure (HCC)    pt states neurologist thinking he was having seizures while sleeping-eeg done 10-2022 and was normal   Sleep apnea    Sleep difficulties    a.) on BZO (temazepam ) PRN   T2DM (type 2 diabetes mellitus) (HCC)    Umbilical hernia without mention of obstruction or gangrene 02/09/2011   Vitamin D  deficiency     Assessment: Patient Reported  Symptoms:  Cognitive Cognitive Status:  (short term memory loss stable)   Health Maintenance Behaviors: Annual physical exam, Healthy diet, Social activities, Spiritual practice(s), Exercise  Neurological Neurological Review of Symptoms: Headaches, Numbness, Weakness Neurological Comment: weakness and aching persist incrased discomfort with cold weather, worsening headaches MRI wednesday, L arm numbness tingling,  HEENT HEENT Symptoms Reported: Tinnitus      Cardiovascular Cardiovascular Symptoms Reported: No symptoms reported Does patient have uncontrolled Hypertension?: No Cardiovascular Management Strategies: Medication therapy, Routine screening Cardiovascular Comment: BP today 127/79  Respiratory Respiratory Symptoms Reported: Shortness of breath Other Respiratory Symptoms: DOE a bit worse when in cold air, practicine energy conservation Respiratory Management Strategies: CPAP, Breathing exercise, Medication therapy  Endocrine Endocrine Symptoms Reported: No symptoms reported Is patient diabetic?: Yes Is patient checking blood sugars at home?: Yes List most recent blood sugar readings, include date and time of day: FBG today 94, no high/lows    Gastrointestinal Gastrointestinal Symptoms Reported: No symptoms reported Additional Gastrointestinal Details: reduced llinzess to half dose due to diarrhe awith improvement, appetite good      Genitourinary Genitourinary Symptoms Reported: No symptoms reported    Integumentary Integumentary Symptoms Reported: Other Other Integumentary Symptoms: no areas of consern    Musculoskeletal Musculoskelatal Symptoms Reviewed: Back pain, Muscle pain Additional Musculoskeletal Details: left knee pain. swelling at times, occasionally gives out but wears brace regularly - no falls, uses cane in house,  walker outside of home, unsure about knee replacementgen weakness with mitochondrial disease Musculoskeletal Management Strategies: Activity, Adequate  rest, Exercise, Routine screening Falls in the past year?: Yes Was there an injury with Fall?: Yes Patient at Risk for Falls Due to: History of fall(s), Impaired balance/gait Fall risk Follow up: Falls prevention discussed, Falls evaluation completed  Psychosocial Psychosocial Symptoms Reported: No symptoms reported Additional Psychological Details: looking forward to cruise with family i n January, holidays          12/11/2024    PHQ2-9 Depression Screening   Little interest or pleasure in doing things Not at all  Feeling down, depressed, or hopeless Not at all  PHQ-2 - Total Score 0  Trouble falling or staying asleep, or sleeping too much    Feeling tired or having little energy    Poor appetite or overeating     Feeling bad about yourself - or that you are a failure or have let yourself or your family down    Trouble concentrating on things, such as reading the newspaper or watching television    Moving or speaking so slowly that other people could have noticed.  Or the opposite - being so fidgety or restless that you have been moving around a lot more than usual    Thoughts that you would be better off dead, or hurting yourself in some way    PHQ2-9 Total Score    If you checked off any problems, how difficult have these problems made it for you to do your work, take care of things at home, or get along with other people    Depression Interventions/Treatment      Today's Vitals   12/11/24 0949  BP: 127/79      Medications Reviewed Today   Medications were not reviewed in this encounter     Recommendation:   PCP Follow-up Continue Current Plan of Care  Follow Up Plan:   Telephone follow-up in 1 month  Nestora Duos, MSN, RN [facility]  [facility], [facility] RN Care Manager Direct Dial: [phone] Fax: [phone]

## 2024-12-12 ENCOUNTER — Ambulatory Visit: Admitting: Family Medicine

## 2024-12-13 DIAGNOSIS — G43009 Migraine without aura, not intractable, without status migrainosus: Secondary | ICD-10-CM | POA: Diagnosis not present

## 2024-12-13 DIAGNOSIS — G43909 Migraine, unspecified, not intractable, without status migrainosus: Secondary | ICD-10-CM | POA: Diagnosis not present

## 2024-12-14 ENCOUNTER — Encounter: Payer: Self-pay | Admitting: Family Medicine

## 2024-12-18 ENCOUNTER — Ambulatory Visit: Admitting: Family Medicine

## 2024-12-18 ENCOUNTER — Encounter: Payer: Self-pay | Admitting: Family Medicine

## 2024-12-18 VITALS — BP 118/76 | HR 75 | Resp 16 | Ht 71.0 in | Wt 231.6 lb

## 2024-12-18 DIAGNOSIS — I152 Hypertension secondary to endocrine disorders: Secondary | ICD-10-CM | POA: Diagnosis not present

## 2024-12-18 DIAGNOSIS — E559 Vitamin D deficiency, unspecified: Secondary | ICD-10-CM

## 2024-12-18 DIAGNOSIS — F5101 Primary insomnia: Secondary | ICD-10-CM

## 2024-12-18 DIAGNOSIS — E1159 Type 2 diabetes mellitus with other circulatory complications: Secondary | ICD-10-CM | POA: Diagnosis not present

## 2024-12-18 DIAGNOSIS — R569 Unspecified convulsions: Secondary | ICD-10-CM

## 2024-12-18 DIAGNOSIS — R809 Proteinuria, unspecified: Secondary | ICD-10-CM

## 2024-12-18 DIAGNOSIS — J4520 Mild intermittent asthma, uncomplicated: Secondary | ICD-10-CM

## 2024-12-18 DIAGNOSIS — Z7985 Long-term (current) use of injectable non-insulin antidiabetic drugs: Secondary | ICD-10-CM

## 2024-12-18 DIAGNOSIS — E1129 Type 2 diabetes mellitus with other diabetic kidney complication: Secondary | ICD-10-CM | POA: Diagnosis not present

## 2024-12-18 DIAGNOSIS — E1169 Type 2 diabetes mellitus with other specified complication: Secondary | ICD-10-CM

## 2024-12-18 DIAGNOSIS — I209 Angina pectoris, unspecified: Secondary | ICD-10-CM | POA: Diagnosis not present

## 2024-12-18 DIAGNOSIS — E8840 Mitochondrial metabolism disorder, unspecified: Secondary | ICD-10-CM

## 2024-12-18 DIAGNOSIS — E785 Hyperlipidemia, unspecified: Secondary | ICD-10-CM

## 2024-12-18 DIAGNOSIS — G4733 Obstructive sleep apnea (adult) (pediatric): Secondary | ICD-10-CM

## 2024-12-18 MED ORDER — TIRZEPATIDE 7.5 MG/0.5ML ~~LOC~~ SOAJ: 7.5000 mg | SUBCUTANEOUS | 0 refills | Status: AC

## 2024-12-18 NOTE — Progress Notes (Signed)
 Name: [name] [name]   MRN: [id number]    DOB: Apr 10, 1961   Date:12/18/2024       Progress Note  Subjective  Chief Complaint  Chief Complaint  Patient presents with   Medical Management of Chronic Issues   Discussed the use of AI scribe software for clinical note transcription with the patient, who gave verbal consent to proceed.  History of Present Illness [name] [name] is a 63 year old male with diabetes and hypertension who presents for a follow-up visit.  He has been experiencing headaches recently, which led to a recommendation to avoid travel. An MRI was performed as part of the evaluation. Despite family concerns, he plans to go on a cruise with 38 people, including his great-grandchildren, on January 24th.  His diabetes is well-controlled with recent blood sugar levels ranging between 101 and 119 mg/dL and an J8r of 4.2% in September. No symptoms of hyperglycemia such as increased hunger, thirst, or frequent urination. He takes Mounjaro  7.5 mg weekly and uses Lantus  only when receiving cortisone injections.  He has hypertension and microalbuminuria. He takes losartan  50 mg with hydrochlorothiazide  12.5 mg for blood pressure and kidney protection.  He experienced a fall two weeks ago due to balance issues, which he attributes to knee problems. He received a knee injection two weeks ago.  He has a history of mitochondrial disease, which causes muscle weakness and atrophy. He experienced seizure-like activity in the past, attributed to muscle fasciculations rather than true seizures.  He has obstructive sleep apnea and uses a BiPAP machine at night. He also has asthma and uses Breo in the morning and a non-medicated device for breathing support during exertion.  He has a history of angina and takes carvedilol  6.25 mg, azithromycin  10 mg, and rosuvastatin  10 mg. No recent use of nitroglycerin  this year.  He takes temazepam  as needed for sleep and occasionally uses nystatin  for dry  mouth caused by [facility]. He reduced his Linzess  dose to 72 mg due to side effects and finds it effective.  He has a B12 deficiency and receives regular B12 injections. He also takes CoQ10 and other supplements.    Patient Active Problem List   Diagnosis Date Noted   OSA treated with BiPAP 06/07/2024   Osteoarthritis of left knee 06/04/2024   Bilateral carpal tunnel syndrome 05/11/2023   Dyslipidemia associated with type 2 diabetes mellitus (HCC) 05/11/2023   Right groin pain 02/03/2023   Neurogenic muscular atrophy 05/08/2022   Primary insomnia 05/08/2022   Controlled type 2 diabetes mellitus with microalbuminuria, without long-term current use of insulin  (HCC) 05/08/2022   Contraindication to statin medication 05/08/2022   Mitochondrial disease 01/22/2022   Tremor 01/22/2022   Shortness of breath 01/22/2022   Muscle atrophy of lower extremity 11/10/2021   Dysphagia    Asthma, well controlled 05/14/2021   Multiple thyroid  nodules 05/14/2021   Localized, primary osteoarthritis of hand 09/28/2019   Seizure-like activity (HCC) 02/22/2018   Mild cognitive impairment 11/17/2017   Radiculopathy of cervical region 11/17/2017   Stenosis of carotid artery 09/08/2017   Cervical stenosis of spine 09/08/2017   B12 deficiency 06/28/2017   Headache disorder 02/15/2017   Memory loss or impairment 02/15/2017   Coronary artery disease involving native coronary artery of native heart    Radiculitis of left cervical region 03/05/2016   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) 12/05/2015   Patellar subluxation 06/05/2015   Allergic rhinitis, seasonal 06/05/2015   Chronic constipation 06/05/2015  Chronic lower back pain 06/05/2015   Vitamin D  deficiency 06/05/2015   Central sleep apnea 06/05/2015   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   Nerve root pain 06/05/2015   Acquired polycythemia 06/05/2015   Anterior knee pain 06/05/2015   Failure of erection 06/05/2015    Gastro-esophageal reflux disease without esophagitis 06/05/2015   Neuropathy 06/05/2015   Angina, class III 05/23/2015   Hypertension associated with diabetes (HCC) 05/23/2015   Hyperlipidemia 05/23/2015   Inguinal hernia without mention of obstruction or gangrene, recurrent unilateral or unspecified 03/24/2013    Past Surgical History:  Procedure Laterality Date   BACK SURGERY  12/2008   X2-LUMBAR   CARDIAC CATHETERIZATION N/A 05/30/2015   Procedure: Left Heart Cath;  Surgeon: Deatrice DELENA Cage, MD;  Location: [facility];  Service: Cardiovascular;  Laterality: N/A;   CARDIAC CATHETERIZATION N/A 04/16/2016   Procedure: Left Heart Cath and Coronary Angiography;  Surgeon: Deatrice DELENA Cage, MD;  Location: [facility];  Service: Cardiovascular;  Laterality: N/A;   COLONOSCOPY  2014   Dr. Dellie   COLONOSCOPY WITH PROPOFOL  N/A 12/25/2016   Procedure: COLONOSCOPY WITH PROPOFOL ;  Surgeon: Ruel Kung, MD;  Location: [facility];  Service: Endoscopy;  Laterality: N/A;   COLONOSCOPY WITH PROPOFOL  N/A 01/29/2017   Procedure: COLONOSCOPY WITH PROPOFOL ;  Surgeon: Ruel Kung, MD;  Location: [facility];  Service: Endoscopy;  Laterality: N/A;   COLONOSCOPY WITH PROPOFOL  N/A 04/11/2020   Procedure: COLONOSCOPY WITH PROPOFOL ;  Surgeon: Kung Ruel, MD;  Location: [facility];  Service: Gastroenterology;  Laterality: N/A;   CORONARY PRESSURE/FFR STUDY N/A 07/30/2021   Procedure: INTRAVASCULAR PRESSURE WIRE/FFR STUDY;  Surgeon: Cage Deatrice DELENA, MD;  Location: [facility];  Service: Cardiovascular;  Laterality: N/A;   ESOPHAGEAL MANOMETRY N/A 03/04/2022   Procedure: ESOPHAGEAL MANOMETRY (EM);  Surgeon: Shila Gustav GAILS, MD;  Location: [facility];  Service: Gastroenterology;  Laterality: N/A;   ESOPHAGOGASTRODUODENOSCOPY N/A 05/09/2024   Procedure: EGD (ESOPHAGOGASTRODUODENOSCOPY);  Surgeon: Unk Corinn Skiff, MD;  Location: [facility];  Service: Endoscopy;   Laterality: N/A;   ESOPHAGOGASTRODUODENOSCOPY (EGD) WITH PROPOFOL  N/A 01/26/2019   Procedure: ESOPHAGOGASTRODUODENOSCOPY (EGD) WITH PROPOFOL ;  Surgeon: Unk Corinn Skiff, MD;  Location: [facility];  Service: Gastroenterology;  Laterality: N/A;   ESOPHAGOGASTRODUODENOSCOPY (EGD) WITH PROPOFOL  N/A 09/10/2021   Procedure: ESOPHAGOGASTRODUODENOSCOPY (EGD) WITH PROPOFOL ;  Surgeon: Unk Corinn Skiff, MD;  Location: [facility];  Service: Gastroenterology;  Laterality: N/A;   EVALUATION UNDER ANESTHESIA WITH HEMORRHOIDECTOMY N/A 02/24/2017   Procedure: EXAM UNDER ANESTHESIA WITH POSSIBLE EXCISION OF INTERNAL HEMORRHOIDS;  Surgeon: Aloysius Plant, MD;  Location: [facility];  Service: General;  Laterality: N/A;   FISSURECTOMY  02/24/2017   Procedure: FISSURECTOMY;  Surgeon: Aloysius Plant, MD;  Location: [facility];  Service: General;;   HAND SURGERY Left 2020   HERNIA REPAIR Right 1991   INGUINAL HERNIA REPAIR Right 2012   Dr Dellie FONTANA HERNIA REPAIR Right 2014   Dr Dellie   LEFT HEART CATH AND CORONARY ANGIOGRAPHY Left 08/05/2020   Procedure: LEFT HEART CATH AND CORONARY ANGIOGRAPHY poss PCI;  Surgeon: Cage Deatrice DELENA, MD;  Location: [facility];  Service: Cardiovascular;  Laterality: Left;   MUSCLE BIOPSY Left 12/31/2021   Procedure: LEFT VASTUS LATERALIS BIOPSY;  Surgeon: Clois Fret, MD;  Location: [facility];  Service: Neurosurgery;  Laterality: Left;   RIGHT/LEFT HEART CATH AND CORONARY ANGIOGRAPHY N/A 07/30/2021   Procedure: RIGHT/LEFT HEART CATH AND CORONARY ANGIOGRAPHY;  Surgeon: Cage Deatrice DELENA, MD;  Location: [facility];  Service: Cardiovascular;  Laterality: N/A;   SPINE SURGERY  2007   WRIST SURGERY Left    XI ROBOT ASSISTED DIAGNOSTIC LAPAROSCOPY Right 02/03/2023   Procedure: DIAGNOSTIC LAPAROSCOPY;  Surgeon: Desiderio Schanz, MD;  Location: [facility];  Service: General;  Laterality: Right;    Family History  Problem Relation Age of Onset    Hypertension Mother    Heart Problems Mother    High Cholesterol Mother    Arthritis Mother    Hearing loss Mother    Vision loss Mother    Heart Problems Father 92       myocardial infarction   Diabetes Father    Mental illness Brother    Other Maternal Aunt        COVID   Lung cancer Maternal Aunt    Lung cancer Maternal Aunt    Cancer Maternal Uncle        unknown   Prostate cancer Maternal Uncle    Testicular cancer Paternal Uncle    Thyroid  disease Daughter    Breast cancer Cousin     Social History   Tobacco Use   Smoking status: Former    Types: Cigars    Start date: 12/28/2002    Quit date: 06/04/2005    Years since quitting: 19.5   Smokeless tobacco: Never   Tobacco comments:    quit 2006-smoked 1 cigar occ  Substance Use Topics   Alcohol use: Not Currently    Current Medications[1]  Allergies[2]  I personally reviewed active problem list, medication list, allergies, family history with the patient/caregiver today.   ROS  Ten systems reviewed and is negative except as mentioned in HPI    Objective Physical Exam VITALS: BP- 118/76 MEASUREMENTS: BMI- 32.0. CONSTITUTIONAL: Patient appears well-developed and well-nourished. No distress. HEENT: Head atraumatic, normocephalic, neck supple. CARDIOVASCULAR: Normal rate, regular rhythm and normal heart sounds. No murmur heard. No BLE edema. PULMONARY: Effort normal and breath sounds normal. Lungs clear to auscultation. No respiratory distress. ABDOMINAL: There is no tenderness or distention. MUSCULOSKELETAL: Normal gait. Without gross motor or sensory deficit. PSYCHIATRIC: Patient has a normal mood and affect. Behavior is normal. Judgment and thought content normal.  Vitals:   12/18/24 1118  BP: 118/76  Pulse: 75  Resp: 16  SpO2: 95%  Weight: 231 lb 9.6 oz (105.1 kg)  Height: 5' 11 (1.803 m)    Body mass index is 32.3 kg/m.    PHQ2/9:    12/18/2024   11:13 AM 12/11/2024    9:53 AM 11/21/2024    10:22 AM 11/06/2024   11:46 AM 09/12/2024    8:16 AM  Depression screen PHQ 2/9  Decreased Interest 0 0 0 0 0  Down, Depressed, Hopeless 0 0 0 0 0  PHQ - 2 Score 0 0 0 0 0    phq 9 is negative  Fall Risk:    12/18/2024   11:13 AM 12/11/2024    9:50 AM 11/21/2024   10:21 AM 11/06/2024   11:40 AM 09/12/2024    8:16 AM  Fall Risk   Falls in the past year? 1 1 1 1  0  Number falls in past yr: 0  1 1 0  Injury with Fall? 1 1 1  1   0   Comment    knee injury    Risk for fall due to : Impaired balance/gait History of fall(s);Impaired balance/gait History of fall(s);Impaired balance/gait;Orthopedic patient History of fall(s);Impaired balance/gait;Impaired mobility No Fall Risks  Follow up Falls evaluation completed Falls prevention discussed;Falls evaluation completed Falls prevention discussed Falls evaluation completed;Education provided;Falls prevention discussed Falls evaluation completed     Data saved with a previous flowsheet row definition    Assessment & Plan Type 2 diabetes mellitus with microalbuminuria, hypertension, and dyslipidemia Blood glucose and blood pressure well-controlled. Microalbuminuria present. Dyslipidemia managed with rosuvastatin . Losartan  used for kidney protection and hypertension. - Ordered A1c test. - Ordered urine microalbumin test. - Continue Mounjaro  7.5 mg weekly. - Continue losartan  50 mg for kidney protection and hypertension. - Continue rosuvastatin  10 mg for dyslipidemia.  Mitochondrial disease with muscle weakness and seizure-like activity Muscle weakness and seizure-like activity likely due to mitochondrial disease. No adverse effects from rosuvastatin . - Continue current management and monitoring.  Obstructive sleep apnea - Continue current management.  Asthma, mild intermittent, well-controlled Asthma well-controlled with Breo and albuterol . - Continue Breo for maintenance. - Use albuterol  as needed for rescue.  Angina, class  III Angina well-managed with carvedilol  and rosuvastatin . No recent nitroglycerin  use. - Continue carvedilol  6.25 mg. - Continue rosuvastatin  10 mg. - Use nitroglycerin  as needed.  Primary insomnia Managed with temazepam  as needed. - Continue temazepam  as needed.  Vitamin D  deficiency Recent levels adequate. - Continue over-the-counter vitamin D  supplementation.  Vitamin B12 deficiency Managed with regular B12 injections. Last injection recent, next due in January. - Continue regular B12 injections. - Ensure two weeks without B12 injection before next blood work.        [1]  Current Outpatient Medications:    Accu-Chek Softclix Lancets lancets, 1 each by Other route 2 (two) times daily. use for testing, Disp: 100 each, Rfl: 3   Alpha Lipoic Acid 200 MG CAPS, Take 200 mg by mouth daily., Disp: , Rfl:    aspirin  EC 81 MG tablet, Take by mouth., Disp: , Rfl:    blood glucose meter kit and supplies, 1 each by Other route in the morning and at bedtime. Dispense based on patient and insurance preference. Use up to two  times daily. (FOR ICD-10 E10.9, E11.9)., Disp: 1 each, Rfl: 0   Blood Glucose Monitoring Suppl DEVI, 1 each by Does not apply route in the morning, at noon, and at bedtime. May substitute to any manufacturer covered by patient's insurance., Disp: 1 each, Rfl: 0   carvedilol  (COREG ) 6.25 MG tablet, Take 1 tablet (6.25 mg total) by mouth 2 (two) times daily with a meal., Disp: 180 tablet, Rfl: 1   Coenzyme Q10 (COQ-10) 100 MG CAPS, Take 100 mg by mouth daily., Disp: , Rfl:    ezetimibe  (ZETIA ) 10 MG tablet, TAKE 1 TABLET(10 MG) BY MOUTH DAILY, Disp: 90 tablet, Rfl: 0   fluticasone  (FLONASE ) 50 MCG/ACT nasal spray, INSTILL 1 SPRAY INTO BOTH NOSTRILS DAILY AS NEEDED, Disp: 48 g, Rfl: 0   fluticasone  furoate-vilanterol (BREO ELLIPTA ) 100-25 MCG/ACT AEPB, Inhale 1 puff into the lungs daily., Disp: 60 each, Rfl: 3   Glucose Blood (BLOOD GLUCOSE TEST STRIPS) STRP, USE TO TEST  BLOOD SUGAR THREE TIMES DAILY AS NEEDED. Per patient Accu-Check is preferred May substitute to any manufacturer covered by patient's insurance., Disp: 100 strip, Rfl: 3   hydrocortisone  2.5 % cream, Apply to hyperpigmentation on face QD on Monday, Wednesday, and Friday PRN., Disp: 28 g, Rfl: 5   insulin  glargine (LANTUS  SOLOSTAR) 100 UNIT/ML Solostar Pen, Inject 5-10 Units into the skin daily. (Patient taking differently: Inject 5-10 Units into the skin daily. Not taking daily - uses when taking steroid shots  with elevated BG), Disp: 15 mL, Rfl: PRN   Insulin  Pen Needle 32G X 6 MM MISC, 1 each by Does not apply route daily at 12 noon., Disp: 100 each, Rfl: 0   ketoconazole  (NIZORAL ) 2 % shampoo, Wash scalp/body/face QD on Tuesday, Thursday, and Saturday, let sit 5 minutes before rinsing off, avoid eyes., Disp: 120 mL, Rfl: 11   linaclotide  (LINZESS ) 72 MCG capsule, Take 72 mcg by mouth daily before breakfast., Disp: , Rfl:    losartan -hydrochlorothiazide  (HYZAAR) 50-12.5 MG tablet, Take 1 tablet by mouth daily., Disp: 90 tablet, Rfl: 1   meloxicam (MOBIC) 7.5 MG tablet, Take 7.5 mg by mouth daily., Disp: , Rfl:    nitroGLYCERIN  (NITROSTAT ) 0.4 MG SL tablet, Place 1 tablet (0.4 mg total) under the tongue every 5 (five) minutes as needed for chest pain., Disp: 25 tablet, Rfl: 1   ranolazine  (RANEXA ) 1000 MG SR tablet, TAKE 1 TABLET(1000 MG) BY MOUTH TWICE DAILY, Disp: 180 tablet, Rfl: 3   Riboflavin (B-2) 100 MG TABS, Take 100 mg by mouth daily., Disp: , Rfl:    rosuvastatin  (CRESTOR ) 10 MG tablet, Take 1 tablet (10 mg total) by mouth daily., Disp: 90 tablet, Rfl: 1   sildenafil  (VIAGRA ) 100 MG tablet, TAKE 1 TABLET BY MOUTH DAILY AS NEEDED FOR ERECTILE DYSFUNCTION, Disp: 30 tablet, Rfl: 0   Sulfacetamide  Sodium 9.8 % SHAM, Shampoo into the face and trunk QD on Monday, Wednesday, and Friday. Let sit a few minutes before washing off., Disp: 120 mL, Rfl: 11   temazepam  (RESTORIL ) 15 MG capsule, Take 1  capsule (15 mg total) by mouth at bedtime. (Patient taking differently: Take 15 mg by mouth at bedtime. As needed), Disp: 90 capsule, Rfl: 0   topiramate (TOPAMAX) 25 MG tablet, Take 25 mg by mouth 2 (two) times daily., Disp: , Rfl:    traMADol  (ULTRAM ) 50 MG tablet, take 1 tablet by mouth every 6 hours for 3 days as needed for pain, Disp: , Rfl:    Vitamin D , Ergocalciferol , (DRISDOL ) 1.25 MG (50000 UNIT) CAPS capsule, Take 1 capsule (50,000 Units total) by mouth every 7 (seven) days., Disp: 12 capsule, Rfl: 1   nystatin  (MYCOSTATIN ) 100000 UNIT/ML suspension, Take 5 mLs (500,000 Units total) by mouth 4 (four) times daily. (Patient not taking: Reported on 12/18/2024), Disp: 473 mL, Rfl: 1   tirzepatide  (MOUNJARO ) 7.5 MG/0.5ML Pen, Inject 7.5 mg into the skin once a week., Disp: 6 mL, Rfl: 0 [2]  Allergies Allergen Reactions   Gabapentin  Other (See Comments)    Groggy-Mood Changes   Latex Rash   Penicillins Hives and Rash    Tolerated 1st generation cephalosporin (CEFAZOLIN ) on 12/31/2021 with no documented ADRs.  PCN reaction causing immediate rash, facial/tongue/throat swelling, SOB or lightheadedness with hypotension: Yes PCN reaction causing severe rash involving mucus membranes or skin necrosis: No PCN reaction that required hospitalization No PCN reaction occurring within the last 10 years: No If all of the above answers are NO, then may proceed with Cephalosporin use.    Cymbalta [Duloxetine Hcl] Other (See Comments)    Elevated BP Headache

## 2024-12-19 ENCOUNTER — Other Ambulatory Visit: Payer: Self-pay | Admitting: Family Medicine

## 2024-12-20 LAB — HEMOGLOBIN A1C
Hgb A1c MFr Bld: 5.5 %
Mean Plasma Glucose: 111 mg/dL
eAG (mmol/L): 6.2 mmol/L

## 2024-12-20 NOTE — Telephone Encounter (Signed)
 Requested medication (s) are due for refill today: Yes  Requested medication (s) are on the active medication list: Yes  Last refill:  09/13/24  Future visit scheduled: Yes  Notes to clinic: Unable to refill due to no refill protocol for this medication.        Requested Prescriptions  Pending Prescriptions Disp Refills   ACCU-CHEK GUIDE TEST test strip [Pharmacy Med Name: ACCU-CHEK GUIDE TEST STRIPS 100S] 100 strip     Sig: USE TO CHECK BLOOD SUGAR THREE TIMES DAILY AS NEEDED     There is no refill protocol information for this order

## 2024-12-22 ENCOUNTER — Encounter: Payer: Self-pay | Admitting: Family Medicine

## 2024-12-22 ENCOUNTER — Ambulatory Visit: Payer: Self-pay | Admitting: Family Medicine

## 2024-12-26 ENCOUNTER — Other Ambulatory Visit: Payer: Self-pay | Admitting: Family Medicine

## 2024-12-27 NOTE — Telephone Encounter (Signed)
 Requested Prescriptions  Pending Prescriptions Disp Refills   Accu-Chek Softclix Lancets lancets [Pharmacy Med Name: SOFTCLIX LANCETS] 200 each 0    Sig: USE TO CHECK BLOOD SUGAR TWICE DAILY     Endocrinology: Diabetes - Testing Supplies Passed - 12/27/2024  2:11 PM      Passed - Valid encounter within last 12 months    Recent Outpatient Visits           1 week ago Controlled type 2 diabetes mellitus with microalbuminuria, without long-term current use of insulin  [facility])   [place] [facility] Glenard Mire, MD   3 months ago Hypertension associated with diabetes [facility])   [place] [facility] Glenard Mire, MD   6 months ago Controlled type 2 diabetes mellitus with microalbuminuria, without long-term current use of insulin  [facility])   [place] [facility] Glenard Mire, MD   7 months ago Mitochondrial disease   [place] [facility] Glenard Mire, MD   10 months ago Controlled type 2 diabetes mellitus with microalbuminuria, without long-term current use of insulin  [facility])   [facility] Glenard Mire, MD       Future Appointments             In 9 months Hester Alm BROCKS, MD [facility]

## 2025-01-01 ENCOUNTER — Telehealth: Payer: Self-pay | Admitting: Pharmacy Technician

## 2025-01-01 ENCOUNTER — Other Ambulatory Visit (HOSPITAL_COMMUNITY): Payer: Self-pay

## 2025-01-01 NOTE — Telephone Encounter (Signed)
 Pharmacy Patient Advocate Encounter  Received notification from [employer] that Prior Authorization for Mounjaro  5MG /0.5ML auto-injectors has been CANCELLED due to     PA #/Case ID/Reference #: [id number]

## 2025-01-01 NOTE — Telephone Encounter (Signed)
 Pharmacy Patient Advocate Encounter   Received notification from Onbase that prior authorization for Mounjaro  5MG /0.5ML auto-injectors is due for renewal.   Insurance verification completed.   The patient is insured through [employer].  Action: PA required; PA started via CoverMyMeds. KEY [id number] . Waiting for clinical questions to populate.

## 2025-01-02 ENCOUNTER — Encounter: Payer: Self-pay | Admitting: Family Medicine

## 2025-01-03 ENCOUNTER — Other Ambulatory Visit (HOSPITAL_COMMUNITY)
Admission: RE | Admit: 2025-01-03 | Discharge: 2025-01-03 | Disposition: A | Discharge status: Home / Self Care | Source: Ambulatory Visit | Attending: Family Medicine | Admitting: Family Medicine

## 2025-01-03 ENCOUNTER — Other Ambulatory Visit: Payer: Self-pay | Admitting: Family Medicine

## 2025-01-03 ENCOUNTER — Encounter: Payer: Self-pay | Admitting: Family Medicine

## 2025-01-03 ENCOUNTER — Ambulatory Visit: Admitting: Family Medicine

## 2025-01-03 VITALS — BP 132/78 | HR 68 | Resp 16 | Ht 71.0 in | Wt 227.8 lb

## 2025-01-03 DIAGNOSIS — L989 Disorder of the skin and subcutaneous tissue, unspecified: Secondary | ICD-10-CM | POA: Diagnosis present

## 2025-01-03 DIAGNOSIS — F5101 Primary insomnia: Secondary | ICD-10-CM

## 2025-01-03 DIAGNOSIS — D485 Neoplasm of uncertain behavior of skin: Secondary | ICD-10-CM | POA: Diagnosis not present

## 2025-01-03 MED ORDER — LIDOCAINE HCL (PF) 1 % IJ SOLN
2.0000 mL | Freq: Once | INTRAMUSCULAR | Status: AC
  Administered 2025-01-03: 2 mL

## 2025-01-03 MED ORDER — TEMAZEPAM 15 MG PO CAPS: 15.0000 mg | ORAL_CAPSULE | Freq: Every day | ORAL | 0 refills | Status: AC

## 2025-01-03 NOTE — Progress Notes (Signed)
 Name: [name] [name]   MRN: [id number]    DOB: January 14, 1961   Date:01/03/2025       Progress Note  Subjective  Chief Complaint  Chief Complaint  Patient presents with   Skin Problem    Like a wart on L cheek, its sore    Discussed the use of AI scribe software for clinical note transcription with the patient, who gave verbal consent to proceed.  History of Present Illness [name] [name] is a 64 year old male who presents with a sore bump on his face.  He describes the bump as a piece of skin, unsure if it is a skin tag or a wart. It causes discomfort, especially when shaving or pulling his face, and he is concerned about cutting it and causing bleeding. He takes aspirin  in the morning, which may increase the risk of bleeding during any procedure. No allergies to lidocaine  or needles, and he receives injections regularly.  He requests a refill of temazepam , which he uses for sleep. The medication is effective without side effects. He mentions an upcoming cruise in two weeks and expresses a need for the medication during that time.    Patient Active Problem List   Diagnosis Date Noted   OSA treated with BiPAP 06/07/2024   Osteoarthritis of left knee 06/04/2024   Bilateral carpal tunnel syndrome 05/11/2023   Dyslipidemia associated with type 2 diabetes mellitus (HCC) 05/11/2023   Right groin pain 02/03/2023   Neurogenic muscular atrophy 05/08/2022   Primary insomnia 05/08/2022   Controlled type 2 diabetes mellitus with microalbuminuria, without long-term current use of insulin  (HCC) 05/08/2022   Contraindication to statin medication 05/08/2022   Mitochondrial disease 01/22/2022   Tremor 01/22/2022   Shortness of breath 01/22/2022   Muscle atrophy of lower extremity 11/10/2021   Dysphagia    Asthma, well controlled 05/14/2021   Multiple thyroid  nodules 05/14/2021   Localized, primary osteoarthritis of hand 09/28/2019   Seizure-like activity (HCC) 02/22/2018   Mild cognitive  impairment 11/17/2017   Radiculopathy of cervical region 11/17/2017   Stenosis of carotid artery 09/08/2017   Cervical stenosis of spine 09/08/2017   B12 deficiency 06/28/2017   Headache disorder 02/15/2017   Memory loss or impairment 02/15/2017   Coronary artery disease involving native coronary artery of native heart    Radiculitis of left cervical region 03/05/2016   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) 12/05/2015   Patellar subluxation 06/05/2015   Allergic rhinitis, seasonal 06/05/2015   Chronic constipation 06/05/2015   Chronic lower back pain 06/05/2015   Vitamin D  deficiency 06/05/2015   Central sleep apnea 06/05/2015   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   Nerve root pain 06/05/2015   Acquired polycythemia 06/05/2015   Anterior knee pain 06/05/2015   Failure of erection 06/05/2015   Gastro-esophageal reflux disease without esophagitis 06/05/2015   Neuropathy 06/05/2015   Angina, class III 05/23/2015   Hypertension associated with diabetes (HCC) 05/23/2015   Hyperlipidemia 05/23/2015   Inguinal hernia without mention of obstruction or gangrene, recurrent unilateral or unspecified 03/24/2013    Social History   Tobacco Use   Smoking status: Former    Types: Cigars    Start date: 12/28/2002    Quit date: 06/04/2005    Years since quitting: 19.5   Smokeless tobacco: Never   Tobacco comments:    quit 2006-smoked 1 cigar occ  Substance Use Topics   Alcohol use: Not Currently    Current Medications[1]  Allergies[2]  ROS  Ten systems reviewed and is negative except as mentioned in HPI    Objective  Vitals:   01/03/25 0933  BP: 132/78  Pulse: 68  Resp: 16  SpO2: 98%  Weight: 227 lb 12.8 oz (103.3 kg)  Height: 5' 11 (1.803 m)    Body mass index is 31.77 kg/m.   Physical Exam CONSTITUTIONAL: Patient appears well-developed and well-nourished. No distress. HEENT: Head atraumatic, normocephalic, neck supple. CARDIOVASCULAR: Normal  rate, regular rhythm and normal heart sounds. No murmur heard. No BLE edema. PULMONARY: Effort normal and breath sounds normal. No respiratory distress. MUSCULOSKELETAL: Normal gait. Without gross motor or sensory deficit. PSYCHIATRIC: Patient has a normal mood and affect. Behavior is normal. Judgment and thought content normal. SKIN: Bump on face removed. Thick skin observed on face.  Recent Results (from the past 2160 hours)  Hemoglobin A1c     Status: None   Collection Time: 12/18/24 11:49 AM  Result Value Ref Range   Hgb A1c MFr Bld 5.5 <5.7 %    Comment: For the purpose of screening for the presence of diabetes: . <5.7%       Consistent with the absence of diabetes 5.7-6.4%    Consistent with increased risk for diabetes             (prediabetes) > or =6.5%  Consistent with diabetes . This assay result is consistent with a decreased risk of diabetes. . Currently, no consensus exists regarding use of hemoglobin A1c for diagnosis of diabetes in children. . According to American Diabetes Association (ADA) guidelines, hemoglobin A1c <7.0% represents optimal control in non-pregnant diabetic patients. Different metrics may apply to specific patient populations.  Standards of Medical Care in Diabetes(ADA). .    Mean Plasma Glucose 111 mg/dL   eAG (mmol/L) 6.2 mmol/L      Assessment & Plan Excision of benign skin lesion of the face Benign skin lesion causing discomfort, inflamed and sore. Risks of excision include bleeding and scarring, increased by aspirin  use. Procedure involved local anesthesia, excision, and cauterization. Explained risks and procedure. - Performed excision of the skin lesion on the face. - Applied pressure dressing for 24 hours post-procedure. - Sent excised tissue for pathology.   Consent signed: YES  Procedure: Skin Mass Removal Location: face  Equipment used: derma-blade, high temperature cautery, sterile scalpel, tweezers, curved  scissors Anesthesia: 1% Lidocaine  w/o Epinephrine   Cleaned and prepped: alcohol   After consent signed, are of skin prepped with betadine. Lidocaine  w/o epinephrine  injected into skin underneath skin mass. After properly numbed sterile equipment used to remove tag.  Specimen sent for pathology analysis. Instructed on proper care to allow for proper healing. F/U for nursing visit if needed.   Insomnia Managed with temazepam , effective without side effects. Refill needed for upcoming cruise. - Refilled temazepam  prescription.             [1]  Current Outpatient Medications:    Accu-Chek Softclix Lancets lancets, USE TO CHECK BLOOD SUGAR TWICE DAILY, Disp: 200 each, Rfl: 0   Alpha Lipoic Acid 200 MG CAPS, Take 200 mg by mouth daily., Disp: , Rfl:    aspirin  EC 81 MG tablet, Take by mouth., Disp: , Rfl:    blood glucose meter kit and supplies, 1 each by Other route in the morning and at bedtime. Dispense based on patient and insurance preference. Use up to two  times daily. (FOR ICD-10 E10.9, E11.9)., Disp: 1 each, Rfl: 0   Blood Glucose Monitoring Suppl DEVI, 1 each  by Does not apply route in the morning, at noon, and at bedtime. May substitute to any manufacturer covered by patient's insurance., Disp: 1 each, Rfl: 0   carvedilol  (COREG ) 6.25 MG tablet, Take 1 tablet (6.25 mg total) by mouth 2 (two) times daily with a meal., Disp: 180 tablet, Rfl: 1   Coenzyme Q10 (COQ-10) 100 MG CAPS, Take 100 mg by mouth daily., Disp: , Rfl:    ezetimibe  (ZETIA ) 10 MG tablet, TAKE 1 TABLET(10 MG) BY MOUTH DAILY, Disp: 90 tablet, Rfl: 0   fluticasone  (FLONASE ) 50 MCG/ACT nasal spray, INSTILL 1 SPRAY INTO BOTH NOSTRILS DAILY AS NEEDED, Disp: 48 g, Rfl: 0   fluticasone  furoate-vilanterol (BREO ELLIPTA ) 100-25 MCG/ACT AEPB, Inhale 1 puff into the lungs daily., Disp: 60 each, Rfl: 3   glucose blood (ACCU-CHEK GUIDE TEST) test strip, USE TO CHECK BLOOD SUGAR THREE TIMES DAILY AS NEEDED, Disp: 100 strip, Rfl:  1   Glucose Blood (BLOOD GLUCOSE TEST STRIPS) STRP, USE TO TEST BLOOD SUGAR THREE TIMES DAILY AS NEEDED. Per patient Accu-Check is preferred May substitute to any manufacturer covered by patient's insurance., Disp: 100 strip, Rfl: 3   hydrocortisone  2.5 % cream, Apply to hyperpigmentation on face QD on Monday, Wednesday, and Friday PRN., Disp: 28 g, Rfl: 5   insulin  glargine (LANTUS  SOLOSTAR) 100 UNIT/ML Solostar Pen, Inject 5-10 Units into the skin daily. (Patient taking differently: Inject 5-10 Units into the skin daily. Not taking daily - uses when taking steroid shots with elevated BG), Disp: 15 mL, Rfl: PRN   Insulin  Pen Needle 32G X 6 MM MISC, 1 each by Does not apply route daily at 12 noon., Disp: 100 each, Rfl: 0   ketoconazole  (NIZORAL ) 2 % shampoo, Wash scalp/body/face QD on Tuesday, Thursday, and Saturday, let sit 5 minutes before rinsing off, avoid eyes., Disp: 120 mL, Rfl: 11   linaclotide  (LINZESS ) 72 MCG capsule, Take 72 mcg by mouth daily before breakfast., Disp: , Rfl:    losartan -hydrochlorothiazide  (HYZAAR) 50-12.5 MG tablet, Take 1 tablet by mouth daily., Disp: 90 tablet, Rfl: 1   meloxicam (MOBIC) 7.5 MG tablet, Take 7.5 mg by mouth daily., Disp: , Rfl:    nitroGLYCERIN  (NITROSTAT ) 0.4 MG SL tablet, Place 1 tablet (0.4 mg total) under the tongue every 5 (five) minutes as needed for chest pain., Disp: 25 tablet, Rfl: 1   ranolazine  (RANEXA ) 1000 MG SR tablet, TAKE 1 TABLET(1000 MG) BY MOUTH TWICE DAILY, Disp: 180 tablet, Rfl: 3   Riboflavin (B-2) 100 MG TABS, Take 100 mg by mouth daily., Disp: , Rfl:    rosuvastatin  (CRESTOR ) 10 MG tablet, Take 1 tablet (10 mg total) by mouth daily., Disp: 90 tablet, Rfl: 1   sildenafil  (VIAGRA ) 100 MG tablet, TAKE 1 TABLET BY MOUTH DAILY AS NEEDED FOR ERECTILE DYSFUNCTION, Disp: 30 tablet, Rfl: 0   Sulfacetamide  Sodium 9.8 % SHAM, Shampoo into the face and trunk QD on Monday, Wednesday, and Friday. Let sit a few minutes before washing off., Disp:  120 mL, Rfl: 11   tirzepatide  (MOUNJARO ) 7.5 MG/0.5ML Pen, Inject 7.5 mg into the skin once a week., Disp: 6 mL, Rfl: 0   topiramate (TOPAMAX) 25 MG tablet, Take 25 mg by mouth 2 (two) times daily., Disp: , Rfl:    Vitamin D , Ergocalciferol , (DRISDOL ) 1.25 MG (50000 UNIT) CAPS capsule, Take 1 capsule (50,000 Units total) by mouth every 7 (seven) days., Disp: 12 capsule, Rfl: 1   nystatin  (MYCOSTATIN ) 100000 UNIT/ML suspension, Take 5 mLs (500,000  Units total) by mouth 4 (four) times daily. (Patient not taking: Reported on 01/03/2025), Disp: 473 mL, Rfl: 1   temazepam  (RESTORIL ) 15 MG capsule, Take 1 capsule (15 mg total) by mouth at bedtime. As needed, Disp: 90 capsule, Rfl: 0   traMADol  (ULTRAM ) 50 MG tablet, take 1 tablet by mouth every 6 hours for 3 days as needed for pain, Disp: , Rfl:  [2]  Allergies Allergen Reactions   Gabapentin  Other (See Comments)    Groggy-Mood Changes   Latex Rash   Penicillins Hives and Rash    Tolerated 1st generation cephalosporin (CEFAZOLIN ) on 12/31/2021 with no documented ADRs.  PCN reaction causing immediate rash, facial/tongue/throat swelling, SOB or lightheadedness with hypotension: Yes PCN reaction causing severe rash involving mucus membranes or skin necrosis: No PCN reaction that required hospitalization No PCN reaction occurring within the last 10 years: No If all of the above answers are NO, then may proceed with Cephalosporin use.    Cymbalta [Duloxetine Hcl] Other (See Comments)    Elevated BP Headache

## 2025-01-05 ENCOUNTER — Ambulatory Visit: Payer: Self-pay | Admitting: Family Medicine

## 2025-01-05 ENCOUNTER — Encounter: Payer: Self-pay | Admitting: Family Medicine

## 2025-01-05 LAB — SURGICAL PATHOLOGY

## 2025-01-08 ENCOUNTER — Other Ambulatory Visit: Payer: Self-pay

## 2025-01-08 NOTE — Patient Instructions (Signed)
 Visit Information  [name] was given information about Medicaid Managed Care team care coordination services as a part of their [facility] Medicaid benefit. [name] [name]   If you would like to schedule transportation through your [facility] plan, please call the following number at least 2 days in advance of your appointment: [phone]  For information about your ride after you set it up, call Ride Assist at [phone]. Use this number to activate a Will Call pickup, or if your transportation is late for a scheduled pickup. Use this number, too, if you need to make a change or cancel a previously scheduled reservation.  If you need transportation services right away, call [phone]. The after-hours call center is staffed 24 hours to handle ride assistance and urgent reservation requests (including discharges) 365 days a year. Urgent trips include sick visits, hospital discharge requests and life-sustaining treatment.  Call the [facility] Line at [phone], at any time, 24 hours a day, 7 days a week. If you are in danger or need immediate medical attention call 911.   Please see education materials related to NONE provided by MyChart link.  Patient verbalizes understanding of instructions and care plan provided today and agrees to view in MyChart. Active MyChart status and patient understanding of how to access instructions and care plan via MyChart confirmed with patient.     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  Goal: Over the next 90 days the Patient will attend all scheduled medical appointments: Patient will attend all appointments as evidenced by chart review        continue to work with RN Care Manager and/or Social Worker to address care management and care coordination needs related to Falls/Fatigue/Pain r/t Mitochondrial Myopathy as evidenced by adherence to care management team scheduled appointments     take all medications exactly as prescribed and will call provider for medication related questions as evidenced by patient report    verbalize basic understanding of Falls/Fatigue/Pain r/t Mitochondrial Myopathy disease process and self health management plan as evidenced by continue to practice fall precautions, use cane walker, energy conservation, exercise regularly as tolerated, diabetic/heart healthy diet, pain control using various methods, report falls/changes/concerns to provider promptly  Interventions:   Falls/Fatigue Interventions: Provided written and verbal education re: potential causes of falls and Fall prevention strategies Reviewed medications and discussed potential side effects of medications such as dizziness and frequent urination Advised patient of importance of notifying provider of falls Assessed for signs and symptoms of orthostatic hypotension - None Assessed for falls since last encounter - None Assessed patients knowledge of fall risk prevention secondary to previously provided education - patient has made adjustments in home, uses cane, standing walker Practice energy conservation, promptly notify provider of change in activity tolerance or symptoms of illness.  Reports using energy conservation techniques Screening for signs and symptoms of depression related to chronic disease state - None  Assessed social determinant of health barriers - [employer] referral for  information on possible jobs within constraints of disability and SS/voc rehab. Patient applied for Voc Rehab. Has resources from [employer] and from RNCM/[employer] re Asthma. Will call [phone] for rental assist due to time constraints, Disability/SS overpaid and took money back so tight for rent this month. Reports no food or housing needs at this time - resolved.  Patient will call prescriber for VV to discuss MRI   Pain Interventions: Pain assessment performed Medications reviewed Discussed importance of adherence to all scheduled medical appointments Counseled on the importance of reporting any/all new or changed pain symptoms or management strategies to pain management provider Advised patient to report to care team affect of pain on daily activities Discussed use of relaxation techniques and/or diversional activities to assist with pain reduction (distraction, imagery, relaxation, massage, acupressure, TENS, heat, and cold application Screening for signs and symptoms of depression related to chronic disease state  Assessed social determinant of health barriers - no needs at this time, has resources MRI pending for worsening headaches  Patient Self-Care Activities:  Attend all scheduled provider appointments Call pharmacy for medication refills 3-7 days in advance of running out of medications Call provider office for new concerns or questions  Take medications as prescribed    Plan:  Telephone follow up appointment with care management team member scheduled for:  02/02/2025 at 9:00 am

## 2025-01-08 NOTE — Patient Outreach (Signed)
 Complex Care Management   Visit Note  01/08/2025  Name:  [name] [name] MRN: [id number] DOB: [birth year]  Situation: Referral received for Complex Care Management related to Falls, Fatigue I obtained verbal consent from Patient.  Visit completed with Patient  on the phone  Background:   Past Medical History:  Diagnosis Date   Allergy    Angina, class III    Arthritis 2010   CAD (coronary artery disease)    a. 05/30/2015 cath: mLAD 50% (FFR 0.83)->Med Rx; b. 03/2016 Cath: LAD 32m-->Med Rx; c. 07/2020 Cath: LAD 59m; d. 07/2021 Cath: LM nl, LAD 10ost/m, 60m (nl FFR), D1/D2/D3 min irregs, LCX min irregs, OM1/2/3 nl, RCA min irregs, RPDA/RPAV/RPL1/RPL2 nl; e. 05/2024 Cor CTA: Ca2+ = 1111 (99th%'ile). Diffuse 25-49% stenoses. Total plaque vol 830m^3 (76th %'ile).   Chronic constipation    Degeneration of intervertebral disc of cervical region    Diastolic dysfunction    a.) TTE 07/22/2017: EF 60-65%, G1DD; b.) TTE 02/14/2021: EF 60-65%, G1DD; c.) TTE 01/19/2023: EF 55-60%, AoV sclerosis without stenosis, G1DD   Dyspnea    Erectile dysfunction    a.) on PDE5i (sildenafil )   GERD (gastroesophageal reflux disease)    Hernia 1991   Hyperlipidemia    Hypertension 2008   Mitochondrial myopathy    a.) followed by medical genetics specialist (Dr. Vaughan Neighbours, MD) at [facility]   Nerve root pain    Neuropathy    Obesity, unspecified 2012   OSA on CPAP    Personal history of tobacco use, presenting hazards to health 2012   Polycythemia    Rectal bleeding 02/11/2017   Recurrent Right Inguinal Hernia Repair 02/09/2011, 04/07/2013   Seizure (HCC)    pt states neurologist thinking he was having seizures while sleeping-eeg done 10-2022 and was normal   Sleep apnea    Sleep difficulties    a.) on BZO (temazepam ) PRN   T2DM (type 2 diabetes mellitus) (HCC)    Umbilical hernia without mention of obstruction or gangrene 02/09/2011   Vitamin D  deficiency     Assessment: Patient Reported  Symptoms:  Cognitive Cognitive Status: Other: (short term memory loss unchanged) Cognitive/Intellectual Conditions Management [RPT]: None reported or documented in medical history or problem list   Health Maintenance Behaviors: Annual physical exam, Healthy diet, Social activities, Spiritual practice(s), Exercise  Neurological Neurological Review of Symptoms: Headaches, Numbness, Weakness Neurological Comment: weakness and aches especially with cold weather, MRI 12/13/25 with [facility] Nivia, no discussion from provider, patient will call for VV to discuss since next appointment not unitl 02/26/25 (neurosurgeon at [facility] for L arm weakness), reports headaches no worse  HEENT HEENT Symptoms Reported: Tinnitus (persists but unchanged) HEENT Management Strategies: Routine screening    Cardiovascular Cardiovascular Symptoms Reported: No symptoms reported Cardiovascular Management Strategies: Medication therapy Cardiovascular Comment: 110-120/70-80s  Respiratory Respiratory Symptoms Reported: Shortness of breath Additional Respiratory Details: report SOB due to mitochondrial disease - paces self, has arranged for wheelchair on cruise Respiratory Management Strategies: CPAP, Breathing exercise, Routine screening  Endocrine Endocrine Symptoms Reported: No symptoms reported Is patient diabetic?: Yes Is patient checking blood sugars at home?: Yes List most recent blood sugar readings, include date and time of day: FBG not checked yet today - ranging 109-125 denies highs/lows    Gastrointestinal Gastrointestinal Symptoms Reported: No symptoms reported Gastrointestinal Management Strategies: Medication therapy    Genitourinary Genitourinary Symptoms Reported: Not assessed    Integumentary Integumentary Symptoms Reported: Not assessed    Musculoskeletal Additional Musculoskeletal Details: pain 7/10,  back, neck affect left arm worse with cold weather (RA and hx several fractures), L knee brace, cane  usually, stand up walker when out, weakness no new falls Musculoskeletal Management Strategies: Routine screening, Medication therapy Falls in the past year?: Yes Number of falls in past year: 1 or less Was there an injury with Fall?: Yes Fall Risk Category Calculator: 2 Patient Fall Risk Level: Moderate Fall Risk Patient at Risk for Falls Due to: Impaired balance/gait, Impaired mobility, Orthopedic patient Fall risk Follow up: Education provided  Psychosocial Psychosocial Symptoms Reported: No symptoms reported Additional Psychological Details: excited about cruise, future thinking, engaged with family and friends          01/08/2025    PHQ2-9 Depression Screening   Little interest or pleasure in doing things Not at all  Feeling down, depressed, or hopeless Not at all  PHQ-2 - Total Score 0  Trouble falling or staying asleep, or sleeping too much    Feeling tired or having little energy    Poor appetite or overeating     Feeling bad about yourself - or that you are a failure or have let yourself or your family down    Trouble concentrating on things, such as reading the newspaper or watching television    Moving or speaking so slowly that other people could have noticed.  Or the opposite - being so fidgety or restless that you have been moving around a lot more than usual    Thoughts that you would be better off dead, or hurting yourself in some way    PHQ2-9 Total Score    If you checked off any problems, how difficult have these problems made it for you to do your work, take care of things at home, or get along with other people    Depression Interventions/Treatment      There were no vitals filed for this visit.    Medications Reviewed Today     Reviewed by Devra Lands, RN (Registered Nurse) on 01/08/25 at 0945  Med List Status: <None>   Medication Order Taking? Sig Documenting Provider Last Dose Status Informant  Accu-Chek Softclix Lancets lancets [id number] Yes USE  TO CHECK BLOOD SUGAR TWICE DAILY Sowles, Krichna, MD  Active   Alpha Lipoic Acid 200 MG CAPS [id number] Yes Take 200 mg by mouth daily. [provider]  Active   aspirin  EC 81 MG tablet [id number] Yes Take by mouth. [provider]  Active   blood glucose meter kit and supplies [id number] Yes 1 each by Other route in the morning and at bedtime. Dispense based on patient and insurance preference. Use up to two  times daily. (FOR ICD-10 E10.9, E11.9). Sowles, Krichna, MD  Active Self  Blood Glucose Monitoring Suppl DEVI [id number] Yes 1 each by Does not apply route in the morning, at noon, and at bedtime. May substitute to any manufacturer covered by patient's insurance. Sowles, Krichna, MD  Active   carvedilol  (COREG ) 6.25 MG tablet [id number] Yes Take 1 tablet (6.25 mg total) by mouth 2 (two) times daily with a meal. Glenard Mire, MD  Active   Coenzyme Q10 (COQ-10) 100 MG CAPS [id number] Yes Take 100 mg by mouth daily. [provider]  Active   ezetimibe  (ZETIA ) 10 MG tablet [id number] Yes TAKE 1 TABLET(10 MG) BY MOUTH DAILY Sowles, Krichna, MD  Active   fluticasone  (FLONASE ) 50 MCG/ACT nasal spray [id number] Yes INSTILL 1 SPRAY INTO BOTH NOSTRILS DAILY AS NEEDED Sowles, Krichna, MD  Active  fluticasone  furoate-vilanterol (BREO ELLIPTA ) 100-25 MCG/ACT AEPB [id number] Yes Inhale 1 puff into the lungs daily. Sowles, Krichna, MD  Active   glucose blood (ACCU-CHEK GUIDE TEST) test strip [id number] Yes USE TO CHECK BLOOD SUGAR THREE TIMES DAILY AS NEEDED Sowles, Krichna, MD  Active   Glucose Blood (BLOOD GLUCOSE TEST STRIPS) STRP [id number] Yes USE TO TEST BLOOD SUGAR THREE TIMES DAILY AS NEEDED. Per patient Accu-Check is preferred May substitute to any manufacturer covered by patient's insurance. Sowles, Krichna, MD  Active   hydrocortisone  2.5 % cream [id number] Yes Apply to hyperpigmentation on face QD on Monday, Wednesday, and Friday PRN. Hester Alm BROCKS, MD  Active   insulin   glargine (LANTUS  SOLOSTAR) 100 UNIT/ML Solostar Pen [id number]  Inject 5-10 Units into the skin daily.  Patient taking differently: Inject 5-10 Units into the skin daily. Not taking daily - uses when taking steroid shots with elevated BG   Sowles, Krichna, MD  Active   Insulin  Pen Needle 32G X 6 MM MISC [id number]  1 each by Does not apply route daily at 12 noon. Sowles, Krichna, MD  Active   ketoconazole  (NIZORAL ) 2 % shampoo [id number] Yes Wash scalp/body/face QD on Tuesday, Thursday, and Saturday, let sit 5 minutes before rinsing off, avoid eyes. Hester Alm BROCKS, MD  Active   linaclotide  (LINZESS ) 72 MCG capsule [id number] Yes Take 72 mcg by mouth daily before breakfast. [provider]  Active   losartan -hydrochlorothiazide  (HYZAAR) 50-12.5 MG tablet [id number] Yes Take 1 tablet by mouth daily. Sowles, Krichna, MD  Active   meloxicam [facility]) 7.5 MG tablet [id number] Yes Take 7.5 mg by mouth daily. [provider]  Active   nitroGLYCERIN  (NITROSTAT ) 0.4 MG SL tablet [id number] Yes Place 1 tablet (0.4 mg total) under the tongue every 5 (five) minutes as needed for chest pain. Vivienne Lonni Ingle, NP  Active   nystatin  (MYCOSTATIN ) 100000 UNIT/ML suspension [id number]  Take 5 mLs (500,000 Units total) by mouth 4 (four) times daily.  Patient not taking: Reported on 01/03/2025   Sowles, Krichna, MD  Active   ranolazine  (RANEXA ) 1000 MG SR tablet [id number] Yes TAKE 1 TABLET(1000 MG) BY MOUTH TWICE DAILY Darron Deatrice LABOR, MD  Active   Riboflavin (B-2) 100 MG TABS [id number] Yes Take 100 mg by mouth daily. [provider]  Active   rosuvastatin  (CRESTOR ) 10 MG tablet [id number] Yes Take 1 tablet (10 mg total) by mouth daily. Sowles, Krichna, MD  Active   sildenafil  (VIAGRA ) 100 MG tablet [id number] Yes TAKE 1 TABLET BY MOUTH DAILY AS NEEDED FOR ERECTILE DYSFUNCTION Sowles, Krichna, MD  Active   Sulfacetamide  Sodium 9.8 % SHAM [id number] Yes Shampoo into the face and trunk QD on  Monday, Wednesday, and Friday. Let sit a few minutes before washing off. Hester Alm BROCKS, MD  Active   temazepam  (RESTORIL ) 15 MG capsule [id number] Yes Take 1 capsule (15 mg total) by mouth at bedtime. As needed Sowles, Krichna, MD  Active   tirzepatide  (MOUNJARO ) 7.5 MG/0.5ML Pen [id number] Yes Inject 7.5 mg into the skin once a week. Sowles, Krichna, MD  Active   topiramate (TOPAMAX) 25 MG tablet [id number] Yes Take 25 mg by mouth 2 (two) times daily. [provider]  Active   Vitamin D , Ergocalciferol , (DRISDOL ) 1.25 MG (50000 UNIT) CAPS capsule [id number] Yes Take 1 capsule (50,000 Units total) by mouth every 7 (seven) days. Sowles, Krichna, MD  Active   Med List Note Christie Alexander, CPhT 07/23/21 1212): Cpap  Recommendation:   PCP Follow-up Specialty provider follow-up to discuss MRI  Continue Current Plan of Care   Follow Up Plan:   Telephone follow-up in 1 month Nestora Duos, MSN, RN [facility]  [facility], [facility] RN Care Manager Direct Dial: [phone] Fax: [phone]

## 2025-02-02 ENCOUNTER — Other Ambulatory Visit: Payer: Self-pay

## 2025-02-02 NOTE — Patient Instructions (Signed)
 Visit Information  Thank you for taking time to visit with me today. Please don't hesitate to contact me if I can be of assistance to you before our next scheduled telephone appointment.  Our next appointment is by telephone on 03/02/2025 at 9:30 am  Following is a copy of your care plan:   Goals Addressed             This Visit's Progress    [facility] RN Care Plan - Falls/Fatigue/Pain Secondary to Mitochondreal Myopathy       Problems:  Chronic Disease Management support and education needs related to Falls/Fatigue/Pain Secondary to Mitochondrial Myopathy  Goal: Over the next 90 days the Patient will attend all scheduled medical appointments: Patient will attend all appointments as evidenced by chart review        continue to work with RN Care Manager and/or Social Worker to address care management and care coordination needs related to Falls/Fatigue/Pain r/t Mitochondrial Myopathy as evidenced by adherence to care management team scheduled appointments     take all medications exactly as prescribed and will call provider for medication related questions as evidenced by patient report    verbalize basic understanding of Falls/Fatigue/Pain r/t Mitochondrial Myopathy disease process and self health management plan as evidenced by continue to practice fall precautions, use cane walker, energy conservation, exercise regularly as tolerated, diabetic/heart healthy diet, pain control using various methods, report falls/changes/concerns to provider promptly  Interventions:   Falls/Fatigue Interventions: Provided written and verbal education re: potential causes of falls and Fall prevention strategies Reviewed medications and discussed potential side effects of medications such as dizziness and frequent urination Advised patient of importance of notifying provider of falls Assessed for signs and symptoms of orthostatic hypotension - None Assessed for falls since last encounter - None Assessed  patients knowledge of fall risk prevention secondary to previously provided education - patient has made adjustments in home, uses cane, standing walker Practice energy conservation, promptly notify provider of change in activity tolerance or symptoms of illness.  Reports using energy conservation techniques Screening for signs and symptoms of depression related to chronic disease state - None Assessed social determinant of health barriers - [employer] referral for information on possible jobs within constraints of disability and SS/voc rehab. Patient applied for Voc Rehab. Has resources from [employer] and from [employer] re Asthma. Will call [employer] for rental assist due to time constraints, Disability/SS overpaid and took money back so tight for rent this month. Reports no food or housing needs at this time - resolved.  Patient will call prescriber for VV to discuss MRI   Pain Interventions: Pain assessment performed Medications reviewed Discussed importance of adherence to all scheduled medical appointments Counseled on the importance of reporting any/all new or changed pain symptoms or management strategies to pain management provider Advised patient to report to care team affect of pain on daily activities Discussed use of relaxation techniques and/or diversional activities to assist with pain reduction (distraction, imagery, relaxation, massage, acupressure, TENS, heat, and cold application Screening for signs and symptoms of depression related to chronic disease state  Assessed social determinant of health barriers - no needs at this time, has resources MRI pending for worsening headaches  Patient Self-Care Activities:  Attend all scheduled provider appointments Call pharmacy for medication refills 3-7 days in advance of running out of medications Call provider office for new concerns or questions  Take medications as prescribed    Plan:  Telephone follow up appointment with care management  team member scheduled for:  03/02/2025 at 9:30 am             Care plan and visit instructions communicated with the patient verbally today. Patient agrees to receive a copy in MyChart. Active MyChart status and patient understanding of how to access instructions and care plan via MyChart confirmed with patient.     The patient has been provided with contact information for the care management team and has been advised to call with any health related questions or concerns.   Please call the care guide team at [phone] if you need to cancel or reschedule your appointment.   Please call 1-800-273-TALK (toll free, 24 hour hotline) if you are experiencing a Mental Health or Behavioral Health Crisis or need someone to talk to.  Keyairra Kolinski A. Gordy RN, BA, [facility], CRRN [place]  [facility] RN Care Manager Direct Dial: [phone]  Fax: [phone]

## 2025-02-27 ENCOUNTER — Ambulatory Visit: Admit: 2025-02-27 | Admitting: Gastroenterology

## 2025-02-27 SURGERY — COLONOSCOPY
Anesthesia: General

## 2025-03-02 ENCOUNTER — Other Ambulatory Visit

## 2025-03-20 ENCOUNTER — Ambulatory Visit: Admitting: Family Medicine

## 2025-09-27 ENCOUNTER — Ambulatory Visit: Admitting: Dermatology
